# Patient Record
Sex: Female | Born: 1950 | Race: White | Hispanic: No
Health system: Midwestern US, Community
[De-identification: ages and names within clinical notes are randomized; demographics above are authoritative.]

## PROBLEM LIST (undated history)

## (undated) DIAGNOSIS — R7303 Prediabetes: Secondary | ICD-10-CM

## (undated) DIAGNOSIS — N189 Chronic kidney disease, unspecified: Secondary | ICD-10-CM

## (undated) DIAGNOSIS — G43909 Migraine, unspecified, not intractable, without status migrainosus: Secondary | ICD-10-CM

## (undated) DIAGNOSIS — F419 Anxiety disorder, unspecified: Secondary | ICD-10-CM

## (undated) DIAGNOSIS — F32A Depression, unspecified: Secondary | ICD-10-CM

## (undated) DIAGNOSIS — Z9289 Personal history of other medical treatment: Secondary | ICD-10-CM

## (undated) DIAGNOSIS — F329 Major depressive disorder, single episode, unspecified: Secondary | ICD-10-CM

## (undated) DIAGNOSIS — E039 Hypothyroidism, unspecified: Secondary | ICD-10-CM

## (undated) DIAGNOSIS — I4891 Unspecified atrial fibrillation: Secondary | ICD-10-CM

## (undated) DIAGNOSIS — F3181 Bipolar II disorder: Secondary | ICD-10-CM

## (undated) DIAGNOSIS — K219 Gastro-esophageal reflux disease without esophagitis: Secondary | ICD-10-CM

## (undated) DIAGNOSIS — Z9889 Other specified postprocedural states: Secondary | ICD-10-CM

## (undated) DIAGNOSIS — E785 Hyperlipidemia, unspecified: Secondary | ICD-10-CM

## (undated) DIAGNOSIS — M810 Age-related osteoporosis without current pathological fracture: Secondary | ICD-10-CM

## (undated) HISTORY — DX: Hyperlipidemia, unspecified: E78.5

## (undated) HISTORY — DX: Other specified postprocedural states: Z98.890

## (undated) HISTORY — DX: Unspecified atrial fibrillation: I48.91

## (undated) HISTORY — DX: Personal history of other medical treatment: Z92.89

## (undated) HISTORY — PX: KNEE SURGERY: SHX244

## (undated) HISTORY — PX: THUMB ARTHROSCOPY: SHX2509

## (undated) HISTORY — PX: CARDIOVERSION: SHX1299

## (undated) HISTORY — PX: LUNG REMOVAL, PARTIAL: SHX233

## (undated) HISTORY — DX: Hypothyroidism, unspecified: E03.9

## (undated) HISTORY — DX: Migraine, unspecified, not intractable, without status migrainosus: G43.909

## (undated) HISTORY — DX: Anxiety disorder, unspecified: F41.9

## (undated) HISTORY — DX: Depression, unspecified: F32.A

## (undated) HISTORY — DX: Major depressive disorder, single episode, unspecified: F32.9

## (undated) HISTORY — PX: THORACIC SPINE SURGERY: SHX802

## (undated) HISTORY — PX: ABLATION: SHX5711

---

## 2012-02-27 HISTORY — PX: BACK SURGERY: SHX140

## 2012-04-24 DIAGNOSIS — G43719 Chronic migraine without aura, intractable, without status migrainosus: Secondary | ICD-10-CM | POA: Insufficient documentation

## 2015-04-13 NOTE — Nursing Note (Signed)
Basic Admission Information - Text       Basic Admission Information Adult Entered On:  04/13/2015 8:02 EST    Performed On:  04/13/2015 8:01 EST by Nilsa Nutting A               Height/Weight   Weight Measured :   83.46 kg(Converted to: 184 lb 0 oz, 183.998 lb)    Height/Length Measured :   153.67 cm(Converted to: 5 ft 0 in, 5.04 ft, 60.50 in)    BSA Measured :   1.81 m2   Body Mass Index Measured :   35.34 kg/m2   Height/Length Estimated :   154.9 cm(Converted to: 5 ft 1 in, 5.08 ft, 60.98 in)    Body Mass Index Estimated :   34.78 kg/m2   Ideal Body Weight Calculated :   46.65 kg   Annely Sliva-PA,  Dashawn Bartnick A - 04/13/2015 8:01 EST

## 2015-04-18 NOTE — Discharge Summary (Signed)
 Inpatient Clinical Summary             [facility]  Post-Acute Care Transfer Instructions  PERSON INFORMATION   Name: Stephanie [name], Stephanie [name]   MRN: [id number]    FIN#: [id number]   PHYSICIANS  Admitting Physician: DELVIN LYNWOOD CROME  Attending Physician: DELVIN LYNWOOD CROME   PCP: GERARDA JONELLE CROCHET  Discharge Diagnosis: Carpal tunnel syndrome on right  Comment:       PATIENT EDUCATION INFORMATION  Instructions:             OUT PATIENT POST OP JI HAND/ARM (Custom)  Medication Leaflets:               Follow-up:                        With: Address: When:   PRICE-MD, JAMES L     [phone]    Comments:   Elevate hand, Keep clean and Dry  Return 04/28/15 at 1:00 PM in [facility] office with Ronette Ruddle PA.                         MEDICATION LIST  Medication Reconciliation at Discharge:         Medications that have not changed  Other Medications  bifidobacterium-lactobacillus (Probiotic Formula) 1 Tabs Oral (given by mouth) every day.  Last Dose:____________________  calcium carbonate (calcium (as carbonate) 600 mg oral tablet) 1 Tabs Oral (given by mouth) 2 times a day.  Last Dose:____________________  denosumab (Prolia 60 mg/mL subcutaneous solution) 1 Milliliter Subcutaneous (under the skin) every 6 months.  Last Dose:____________________  diazepam (Valium 5 mg oral tablet) 1 Tabs Oral (given by mouth) 2 times a day.  Last Dose:____________________  diphenhydrAMINE (Benadryl Dye Free Allergy 25 mg oral tablet) 2-4 TABS Oral (given by mouth) every 6 hours.  Last Dose:____________________  diphenoxylate-atropine (Lomotil 2.5 mg-0.025 mg oral tablet) 1 Tabs Oral (given by mouth) 4 times a day as needed for diarrhea.  Last Dose:____________________  eletriptan (Relpax 40 mg oral tablet) 1 Tabs Oral (given by mouth) 2 times a day as needed for migraine headache. may repeat dose once in 2 hours.  Last Dose:____________________  ergocalciferol (Vitamin D) 2,000 Units Oral (given by mouth) every day.  Last  Dose:____________________  esomeprazole (NexIUM 40 mg oral delayed release capsule) 1 Capsules Oral (given by mouth) every day.  Last Dose:____________________  estradiol (estradiol 1 mg oral tablet) 1 Tabs Oral (given by mouth) every day.  Last Dose:____________________  ferrous fumarate (Ferretts Iron 325 mg (106 mg elemental iron) oral tablet) 1 Tabs Oral (given by mouth) every day.  Last Dose:____________________  HYDROcodone-acetaminophen (Lortab 7.5/325) 1 Tabs Oral (given by mouth) every 6 hours as needed for mild pain (1-3) or temp > 100.5 F.  Last Dose:____________________  levothyroxine (levothyroxine 112 mcg (0.112 mg) oral capsule) 112 Microgram Oral (given by mouth) every day.  Last Dose:____________________  montelukast (Singulair 10 mg oral tablet) 1 Tabs Oral (given by mouth) once a day (in the evening).  Last Dose:____________________  morphine (morphine 15 mg/12 hr oral tablet, extended release) 1 Tabs Oral (given by mouth) every 12 hours as needed for moderate pain.  Last Dose:____________________  ondansetron (Zofran 4 mg oral tablet) 1 Tabs Oral (given by mouth) every 8 hours as needed for nausea/vomiting.  Last Dose:____________________  polyethylene glycol 3350 (MiraLax oral powder for reconstitution) T TSP Oral (given by mouth)  every day.  Last Dose:____________________  pravastatin (pravastatin 20 mg oral tablet) 1 Tabs Oral (given by mouth) every day.  Last Dose:____________________  pregabalin (Lyrica 150 mg oral capsule) 1 Capsules Oral (given by mouth) 3 times a day.  Last Dose:____________________  QUEtiapine (SEROquel 50 mg oral tablet)   Last Dose:____________________  temazepam (Restoril 30 mg oral capsule) 1 Capsules Oral (given by mouth) Once a Day (at bedtime) as needed.,  THIS MEDICATION IS ASSOCIATED WITH AN INCREASED RISK OF FALLS.  Last Dose:____________________  venlafaxine (Effexor XR 150 mg oral capsule, extended release) 1 Tabs Oral (given by mouth) every day.  Last  Dose:____________________         Patient's Final Home Medication List Upon Discharge:          bifidobacterium-lactobacillus (Probiotic Formula) 1 Tabs Oral (given by mouth) every day.  calcium carbonate (calcium (as carbonate) 600 mg oral tablet) 1 Tabs Oral (given by mouth) 2 times a day.  denosumab (Prolia 60 mg/mL subcutaneous solution) 1 Milliliter Subcutaneous (under the skin) every 6 months.  diazepam (Valium 5 mg oral tablet) 1 Tabs Oral (given by mouth) 2 times a day.  diphenhydrAMINE (Benadryl Dye Free Allergy 25 mg oral tablet) 2-4 TABS Oral (given by mouth) every 6 hours.  diphenoxylate-atropine (Lomotil 2.5 mg-0.025 mg oral tablet) 1 Tabs Oral (given by mouth) 4 times a day as needed for diarrhea.  eletriptan (Relpax 40 mg oral tablet) 1 Tabs Oral (given by mouth) 2 times a day as needed for migraine headache. may repeat dose once in 2 hours.  ergocalciferol (Vitamin D) 2,000 Units Oral (given by mouth) every day.  esomeprazole (NexIUM 40 mg oral delayed release capsule) 1 Capsules Oral (given by mouth) every day.  estradiol (estradiol 1 mg oral tablet) 1 Tabs Oral (given by mouth) every day.  ferrous fumarate (Ferretts Iron 325 mg (106 mg elemental iron) oral tablet) 1 Tabs Oral (given by mouth) every day.  HYDROcodone-acetaminophen (Lortab 7.5/325) 1 Tabs Oral (given by mouth) every 6 hours as needed for mild pain (1-3) or temp > 100.5 F.  levothyroxine (levothyroxine 112 mcg (0.112 mg) oral capsule) 112 Microgram Oral (given by mouth) every day.  montelukast (Singulair 10 mg oral tablet) 1 Tabs Oral (given by mouth) once a day (in the evening).  morphine (morphine 15 mg/12 hr oral tablet, extended release) 1 Tabs Oral (given by mouth) every 12 hours as needed for moderate pain.  ondansetron (Zofran 4 mg oral tablet) 1 Tabs Oral (given by mouth) every 8 hours as needed for nausea/vomiting.  polyethylene glycol 3350 (MiraLax oral powder for reconstitution) T TSP Oral (given by mouth) every  day.  pravastatin (pravastatin 20 mg oral tablet) 1 Tabs Oral (given by mouth) every day.  pregabalin (Lyrica 150 mg oral capsule) 1 Capsules Oral (given by mouth) 3 times a day.  QUEtiapine (SEROquel 50 mg oral tablet)   temazepam (Restoril 30 mg oral capsule) 1 Capsules Oral (given by mouth) Once a Day (at bedtime) as needed.,  THIS MEDICATION IS ASSOCIATED WITH AN INCREASED RISK OF FALLS.  venlafaxine (Effexor XR 150 mg oral capsule, extended release) 1 Tabs Oral (given by mouth) every day.         Comment:       ORDERS         Order Name Order Details   Discharge Patient 04/18/15 9:53:00 EST, Discharge Home/Self Care, When PACU discharge criteria met

## 2015-04-18 NOTE — Procedures (Signed)
IntraOp Record - [facility]             IntraOp Record - [facility] Summary                                                                   Primary Physician:        Reola Mosher    Case Number:              [id number]    Finalized Date/Time:      04/18/15 09:47:45    Pt. Name:                 Stephanie [name], Stephanie [name]    D.O.B./Sex:               21-Apr-1950    Female    Med Rec #:                [id number]    Physician:                Reola Mosher    Financial #:              [id number]    Pt. Type:                 S    Room/Bed:                 /    Admit/Disch:              04/18/15 07:10:00 -    Institution:       [facility] - Case Times                                                                                                         Entry 1                                                                                                          Patient      In Room Time             04/18/15 09:12:00               Out Room Time                   04/18/15 09:46:00    Anesthesia     Procedure  Start Time               04/18/15 09:28:00               Stop Time                       04/18/15 09:43:00    Last Modified By:         Delton See RN, PATRICIA A                              04/18/15 09:47:24      [facility] - Case Times Audit                                                                          04/18/15 09:47:24         Owner: [email or web address]                             Modifier: [email or web address]                                                      <+> 1         Out Room Time        <+> 1         Stop Time     04/18/15 09:30:03         Owner: [id number]                             Modifier: [email or web address]                                                      <+> 1         Start Time        JIOR - Safety Checklist - Sign In                                                                                         Entry 1  History/Physical on        Yes                             Procedure Consent               Yes    Chart                                                     on Chart     Site Marked (if           Yes    applicable)     Last Modified By:         Delton See, RN, PATRICIA A                              04/18/15 09:01:31      [facility] - Case Attendance                                                                                                    Entry 1                         Entry 2                         Entry 3                                          Case Attendee             Orrin Brigham        PRICE-MD,  Eulis Manly, RN, PATRICIA A    Role Performed            Anesthesiologist                Surgeon Primary                 Circulator    Time In                   04/18/15 09:12:00               04/18/15 09:12:00               04/18/15 09:12:00    Time Out                  04/18/15 09:46:00               04/18/15 09:46:00               04/18/15 09:46:00  Procedure                 Wrist Carpal Tunnel             Wrist Carpal Tunnel             Wrist Carpal Tunnel                              Release or                      Release or                      Release or                              Neurolysi(Left)                 Neurolysi(Left)                 Neurolysi(Left)    Last Modified By:         Delton See RN, PATRICIA Trilby Leaver, RN, PATRICIA Trilby Leaver, RN, PATRICIA A                              04/18/15 09:47:33               04/18/15 09:47:33               04/18/15 09:47:33                                Entry 4                                                                                                          Case Attendee             Jarvis Newcomer A    Role Performed            Surgical Scrub    Time In                   04/18/15 09:12:00    Time Out                  04/18/15 09:46:00    Procedure                 Wrist Carpal Tunnel                              Release or  Neurolysi(Left)    Last Modified By:         Delton See, RN, PATRICIA A                              04/18/15 09:47:33      [facility] - Case Attendance Audit                                                                     04/18/15 09:47:33         Owner: [id number]                             Modifier: [email or web address]                                                          1     <+> Time In            1     <+> Time Out            1     <*> Procedure                              Wrist Carpal Tunnel Release or Neurolysi(Left)            2     <+> Time In            2     <+> Time Out            2     <*> Procedure                              Wrist Carpal Tunnel Release or Neurolysi(Left)            3     <+> Time In            3     <+> Time Out            3     <*> Procedure                              Wrist Carpal Tunnel Release or Neurolysi(Left)            4     <+> Time In            4     <+> Time Out            4     <*> Procedure                              Wrist Carpal Tunnel Release or Neurolysi(Left)     04/18/15 08:56:48         Owner: [id number]  Modifier: [email or web address]                                                      <+> 1         Procedure        <+> 2         Procedure        <+> 3         Case Attendee        <+> 3         Role Performed        <+> 3         Procedure        <+> 4         Case Attendee        <+> 4         Role Performed        <+> 4         Procedure        [facility] - Skin Assessment                                                                                                    Entry 1                                                                                                          Skin Integrity            Intact    Last Modified By:         Delton See, RN, PATRICIA A                              04/18/15 09:01:37      Misha.Shall - Patient Positioning                                                                                                Entry 1  Procedure                 Wrist Carpal Tunnel             Body Position                   Supine                              Release or                              Neurolysi(Left)    Left Arm Position         Extended on Padded Arm          Right Arm Position              Extended on Hand Table                              Board w/Security Strap    Left Leg Position         Extended                        Right Leg Position              Extended    Feet Uncrossed            Yes                             Pressure Points                 Yes                                                              Checked     Positioning Device        C Cradle Foam, Arm              Positioned By                   PRICE-MD,  JAMES L                              Cradle Foam/Gel,                              Pillow, Safety Strap    Outcome Met (O.80)        Yes    Last Modified ByDelton See, RN, PATRICIA A                              04/18/15 09:01:27      [facility] - Skin Prep  Entry 1                                                                                                          Hair Removal     Skin Prep      Prep Agents (Im.270)     Chlorhexidine Gluconate         Prep Area (Im.270)              Arm Hand Right                              2% w/Alcohol     Prep Area Details        Right                           Prep By                         PRICE-MD,  Fayrene Fearing L    Outcome Met (O.100)       Yes    Last Modified By:         Delton See, RN, PATRICIA A                              04/18/15 09:02:06      JIOR - Counts Initial and Final                                                                                           Entry 1                                                                                                           Initial Counts      Initial Counts           NELSON, RN, PATRICIA A,         Items included in               Sponges, Sharps     Performed By             Jarvis Newcomer  A                  the Initial Count     Final Counts      Final Counts             NELSON, RN, PATRICIA A,         Final Count Status              Correct     Performed By             Jarvis Newcomer A     Items Included in        Sponges, Sharps     Final Count     Outcome Met (O.20)        Yes    Last Modified By:         Delton See, RN, PATRICIA A                              04/18/15 08:57:54      [facility] - General Case Data                                                                                                  Entry 1                                                                                                          Case Information      ASA Class                3                               Case Level                      Level 2     OR                       JI 01                           Specialty                       Orthopedic (SN)     Wound Class              1-Clean    Preop Diagnosis           G56.01    Last Modified By:  NELSON, RN, PATRICIA A                              04/18/15 09:18:04      JIOR - General Case Data Audit                                                                   04/18/15 09:18:04         Owner: [email or web address]                             Modifier: [email or web address]                                                      <+> 1         ASA Class        [facility] - Fire Risk Assessment                                                                                               Entry 1                                                                                                          Fire Risk                 Alcohol Based Prep              Fire Risk Score                 2    Assessment: If            Solution, Ignition    checked, checkmark        Source In Use    = 1 point     Last Modified By:          Delton See, RN, PATRICIA A                              04/18/15 08:58:53      [facility] - Safety Checklist - Time Out  Entry 1                                                                                                          Surgical/Procedure        Yes                             Time Out Complete               04/18/15 09:25:00    Team confirms     correct patient,     correct site and     correct procedure     Last Modified By:         Delton See, RN, PATRICIA A                              04/18/15 09:25:34      [facility] - Cautery                                                                                                            Entry 1                                                                                                          ESU Type                  GENERATOR                       Identification                  [id number]                              COVIDIEN/VALLEYLAB              Number     Bipolar Setting           15  Grounding Pad                   No    (watts)                                                   Needed?     Outcome Met (O.10)        Yes    Last Modified By:         Delton See, RN, PATRICIA A                              04/18/15 08:57:08      Misha.Shall - Patient Care Devices                                                                                               Entry 1                         Entry 2                                                                          Equipment Type            MACHINE SEQUENTIAL              BAIR HUGGER                              COMPRESSION    SCD Sleeve Site           Legs Bilateral    Equipment/Tag Number      [id number]                          [id number]    Initiated Pre             Yes    Induction     Last Modified By:         Delton See RN, PATRICIA Trilby Leaver, RN, PATRICIA A                              04/18/15 09:00:37                04/18/15 09:00:37      [facility] - Tourniquet  Entry 1                                                                                                          Tourniquet Type           TOURNIQUET CUFF 18IN W/         Serial Number                   [id number]                              PLC CONN STRL DISP    Setting                   250 mmHg                        Placement                       Arm Upper Right    Padding (Im.120)          Yes    Tourniquet Times      Inflated                 04/18/15 09:27:00               Deflated                        04/18/15 09:32:00    Applied By                PRICE-MD,  Fayrene Fearing L              Outcome Met (O.60)              Yes    Last Modified By:         Delton See, RN, PATRICIA A                              04/18/15 09:32:54      [facility] - Tourniquet Audit                                                                          04/18/15 09:32:54         Owner: [email or web address]                             Modifier: [email or web address]  1     <*> Tourniquet Type                        TOURNIQUET CUFF 18IN W/ PLC CONN STRL DISP            1     <+> Deflated     04/18/15 09:27:51         Owner: [email or web address]                             Modifier: [email or web address]                                                          1     <*> Tourniquet Type                        TOURNIQUET CUFF 18IN W/ PLC CONN STRL DISP            1     <+> Inflated        [facility] - Medications                                                                                                        Entry 1                         Entry 2                                                                          Time Administered         04/18/15 09:27:00               04/18/15 09:27:00    Medication                BUPIVACAINE HCL 0.5%            LIDOCAINE HCL 1%                               INJECTION 0.5%             INJECTION 1% 20 ML    Route of Admin            Local Injection                 Local Injection    Dose/Volume               5  ML                            5 ML    (include amount and     unit of measure)     Site                      Hand                            Hand    Site Detail               Right                           Right    Administered By           PRICE-MD,  Llana Aliment              PRICE-MD,  JAMES L    Outcome Met (O.130)       Yes                             Yes    Last Modified By:         Delton See RN, PATRICIA Trilby Leaver, RN, PATRICIA A                              04/18/15 09:28:41               04/18/15 09:28:41      J[facility] - Medications Audit                                                                         04/18/15 09:28:41         Owner: [email or web address]                             Modifier: [email or web address]                                                          1     <*> Medication                             BUPIVACAINE HCL 0.5% INJECTION 0.5%            1     <+> Time Administered            2     <*> Medication                             LIDOCAINE HCL 1% INJECTION 1% 20 ML  2     <+> Time Administered        JIOR - Dressing/Packing                                                                                                   Entry 1                                                                                                          Site                      Hand                            Site Details                    Right    Dressing Item     Details      Dressing Item            Medicated Gauze, 4x4's,     (Im.290)                 Kerlix/Kling Wrap,                              Elastic wrap    Last Modified By:         Delton See, RN, PATRICIA A                              04/18/15 08:58:17      [facility] - Procedures                                                                                                         Entry 1  Procedure     Description      Procedure                Wrist Carpal Tunnel             Modifiers                       Left                              Release or Neurolysis     Surgical Procedure       WRIST CARPAL TUNNEL     Text                     RELEASE LEFT    Primary Procedure         Yes                             Primary Surgeon                 Reola Mosher    Start                     04/18/15 09:28:00               Stop                            04/18/15 09:43:00    Anesthesia Type           Monitored Anesthesia            Surgical Service                Orthopedic (SN)                              Care    Wound Class               1-Clean    Last Modified By:         Delton See, RN, PATRICIA A                              04/18/15 09:47:28      JIOR - Procedures Audit                                                                          04/18/15 09:47:28         Owner: [email or web address]                             Modifier: [email or web address]  1     <*> Procedure                              Wrist Carpal Tunnel Release or Neurolysis            1     <+> Start            1     <+> Stop     04/18/15 09:09:02         Owner: [email or web address]                             Modifier: [email or web address]                                                          1     <*> Procedure                              Wrist Carpal Tunnel Release or Neurolysis            1     <*> Surgical Procedure Text                WRIST CARPAL TUNNEL RELEASE RIGHT        [facility] - Safety Checklist - Sign Out                                                                                        Entry 1                                                                                                          Patient Safety            Yes    Communication Guide     Used Throughout Case     Last Modified ByDelton See, RN,  PATRICIA A                              04/18/15 09:01:34      [facility] - Transfer  Entry 1                                                                                                          Transferred By            Blake Divine          Via                             Donata Clay, RN, PATRICIA                              A    Post-op Destination       PACU    Skin Assessment      Condition                Intact    Last Modified By:         Delton See RN, PATRICIA A                              04/18/15 09:02:55

## 2015-04-18 NOTE — Op Note (Signed)
Operative Report    Domenica Fail L. Samuella Cota, MD  Service Date: 04/18/2015    PREOPERATIVE DIAGNOSIS:  Carpal tunnel syndrome, left hand.    POSTOPERATIVE DIAGNOSIS:  Carpal tunnel syndrome, left hand.    PROCEDURE:  Median nerve neuroplasty of left wrist.    SURGEON:  Dr. Samuella Cota.    ANESTHESIA:  Local and sedation.  Sedation delivered by Dr. Gerhard Munch.    DESCRIPTION OF PROCEDURE:  The patient taken to operating room 1 at  [facility], supine positioning, chlorhexidine prep,  sterile disposable linen drape, hand elevated and tourniquet inflated  to 250 mmHg.  Local infiltration with Xylocaine/Marcaine 50/50 mix.   Curvilinear incision carried down through skin and subcutaneous tissue  after Xylocaine/Marcaine 50/50 mix over the palmar aspect of the hand.   Palmar fascia was divided sharply.  Transverse carpal ligament was  divided from distal to proximal with retrograde division of  antebrachial fascia carried out for a short distance.  Median nerve  inspected, found to be compressed in the midpoint.  Floor of the canal  was palpated.  There were no masses present.  Tourniquet was deflated.   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   Hemostasis obtained with bipolar electrocautery.  Wounds were  copiously irrigated and skin closed with 4-0 nylon.  Bulky soft  dressing was applied.  She went to recovery room in good condition.   There were no complications.  Blood loss was less than 5 mL.  There  was no pathology specimen.      Montey Hora, MD  TR: *n DD: 04/18/2015 10:11 TD: 04/18/2015 11:40 Job#: [id number]  \\[id number]\\DOC#: [id number]  \\[id number]\\  Signature Line    Electronically Signed on 04/20/2015 04:48 PM EST  ________________________________________________  PRICE-MD,  Llana Aliment

## 2015-04-18 NOTE — Discharge Summary (Signed)
 Inpatient Patient Summary               [facility] - [facility]  [address]  [address]  [place], [place] [address]  [phone]  Patient Discharge Instructions  Name: Stephanie [name], Stephanie [name]  Current Date: 04/18/2015 09:58:05  DOB: [birth year] MRN: [id number] FIN: NBR%[phone]  Patient Address: [address] [place] [address]  Patient Phone: [phone]  Primary Care Provider:  Name: Stephanie [name]  Phone: [phone]   Immunizations Provided:    Discharge Diagnosis: Carpal tunnel syndrome on right  Discharged To: ANTICIPATED%>  Home Treatments: ANTICIPATED%>  Devices/Equipment: REHAB%>  Post Hospital Services: HOSPITAL SERVICES%>  Professional Skilled Services: SKILLED SERVICES%>  Therapist, sports and Community Resources: SERV AND COMM RES, ANTICIPATED%>  Mode of Discharge Transportation: TRANSPORTATION%>  Discharge Orders         Discharge Patient 04/18/15 9:53:00 EST, Discharge Home/Self Care, When PACU discharge criteria met      Comment:   Medications   During the course of your visit, your medication list was updated with the most current information. The details of those changes are reflected below:         Medications that have not changed  Other Medications  bifidobacterium-lactobacillus (Probiotic Formula) 1 Tabs Oral (given by mouth) every day.  Last Dose:____________________  calcium carbonate (calcium (as carbonate) 600 mg oral tablet) 1 Tabs Oral (given by mouth) 2 times a day.  Last Dose:____________________  denosumab (Prolia 60 mg/mL subcutaneous solution) 1 Milliliter Subcutaneous (under the skin) every 6 months.  Last Dose:____________________  diazepam (Valium 5 mg oral tablet) 1 Tabs Oral (given by mouth) 2 times a day.  Last Dose:____________________  diphenhydrAMINE (Benadryl Dye Free Allergy 25 mg oral tablet) 2-4 TABS Oral (given by mouth) every 6 hours.  Last Dose:____________________  diphenoxylate-atropine  (Lomotil 2.5 mg-0.025 mg oral tablet) 1 Tabs Oral (given by mouth) 4 times a day as needed for diarrhea.  Last Dose:____________________  eletriptan (Relpax 40 mg oral tablet) 1 Tabs Oral (given by mouth) 2 times a day as needed for migraine headache. may repeat dose once in 2 hours.  Last Dose:____________________  ergocalciferol (Vitamin D) 2,000 Units Oral (given by mouth) every day.  Last Dose:____________________  esomeprazole (NexIUM 40 mg oral delayed release capsule) 1 Capsules Oral (given by mouth) every day.  Last Dose:____________________  estradiol (estradiol 1 mg oral tablet) 1 Tabs Oral (given by mouth) every day.  Last Dose:____________________  ferrous fumarate (Ferretts Iron 325 mg (106 mg elemental iron) oral tablet) 1 Tabs Oral (given by mouth) every day.  Last Dose:____________________  HYDROcodone-acetaminophen (Lortab 7.5/325) 1 Tabs Oral (given by mouth) every 6 hours as needed for mild pain (1-3) or temp > 100.5 F.  Last Dose:____________________  levothyroxine (levothyroxine 112 mcg (0.112 mg) oral capsule) 112 Microgram Oral (given by mouth) every day.  Last Dose:____________________  montelukast (Singulair 10 mg oral tablet) 1 Tabs Oral (given by mouth) once a day (in the evening).  Last Dose:____________________  morphine (morphine 15 mg/12 hr oral tablet, extended release) 1 Tabs Oral (given by mouth) every 12 hours as needed for moderate pain.  Last Dose:____________________  ondansetron (Zofran 4 mg oral tablet) 1 Tabs Oral (given by mouth) every 8 hours as needed for nausea/vomiting.  Last Dose:____________________  polyethylene glycol 3350 (MiraLax oral powder for reconstitution) T TSP Oral (given by mouth) every day.  Last Dose:____________________  pravastatin (pravastatin 20  mg oral tablet) 1 Tabs Oral (given by mouth) every day.  Last Dose:____________________  pregabalin (Lyrica 150 mg oral capsule) 1 Capsules Oral (given by mouth) 3 times a day.  Last  Dose:____________________  QUEtiapine (SEROquel 50 mg oral tablet)   Last Dose:____________________  temazepam (Restoril 30 mg oral capsule) 1 Capsules Oral (given by mouth) Once a Day (at bedtime) as needed.,  THIS MEDICATION IS ASSOCIATED WITH AN INCREASED RISK OF FALLS.  Last Dose:____________________  venlafaxine (Effexor XR 150 mg oral capsule, extended release) 1 Tabs Oral (given by mouth) every day.  Last Dose:____________________      [facility] would like to thank you for allowing us  to assist you with your healthcare needs. The following includes patient education materials and information regarding your injury/illness.  [name], [name] has been given the following list of follow-up instructions, prescriptions, and patient education materials:  Follow-up Instructions             With: Address: When:   Stephanie [name]     [phone]    Comments:   Elevate hand, Keep clean and Dry  Return 04/28/15 at 1:00 PM in [facility] office with Stephanie Ruddle PA.                It is important to always keep an active list of medications available so that you can share with other providers and manage your medications appropriately. As an additional courtesy, we are also providing you with your final active medications list that you can keep with you.          bifidobacterium-lactobacillus (Probiotic Formula) 1 Tabs Oral (given by mouth) every day.  calcium carbonate (calcium (as carbonate) 600 mg oral tablet) 1 Tabs Oral (given by mouth) 2 times a day.  denosumab (Prolia 60 mg/mL subcutaneous solution) 1 Milliliter Subcutaneous (under the skin) every 6 months.  diazepam (Valium 5 mg oral tablet) 1 Tabs Oral (given by mouth) 2 times a day.  diphenhydrAMINE (Benadryl Dye Free Allergy 25 mg oral tablet) 2-4 TABS Oral (given by mouth) every 6 hours.  diphenoxylate-atropine (Lomotil 2.5 mg-0.025 mg oral tablet) 1 Tabs Oral (given by mouth) 4 times a day as needed for diarrhea.  eletriptan (Relpax 40 mg oral  tablet) 1 Tabs Oral (given by mouth) 2 times a day as needed for migraine headache. may repeat dose once in 2 hours.  ergocalciferol (Vitamin D) 2,000 Units Oral (given by mouth) every day.  esomeprazole (NexIUM 40 mg oral delayed release capsule) 1 Capsules Oral (given by mouth) every day.  estradiol (estradiol 1 mg oral tablet) 1 Tabs Oral (given by mouth) every day.  ferrous fumarate (Ferretts Iron 325 mg (106 mg elemental iron) oral tablet) 1 Tabs Oral (given by mouth) every day.  HYDROcodone-acetaminophen (Lortab 7.5/325) 1 Tabs Oral (given by mouth) every 6 hours as needed for mild pain (1-3) or temp > 100.5 F.  levothyroxine (levothyroxine 112 mcg (0.112 mg) oral capsule) 112 Microgram Oral (given by mouth) every day.  montelukast (Singulair 10 mg oral tablet) 1 Tabs Oral (given by mouth) once a day (in the evening).  morphine (morphine 15 mg/12 hr oral tablet, extended release) 1 Tabs Oral (given by mouth) every 12 hours as needed for moderate pain.  ondansetron (Zofran 4 mg oral tablet) 1 Tabs Oral (given by mouth) every 8 hours as needed for nausea/vomiting.  polyethylene glycol 3350 (MiraLax oral powder for reconstitution) T TSP Oral (given by mouth) every day.  pravastatin (  pravastatin 20 mg oral tablet) 1 Tabs Oral (given by mouth) every day.  pregabalin (Lyrica 150 mg oral capsule) 1 Capsules Oral (given by mouth) 3 times a day.  QUEtiapine (SEROquel 50 mg oral tablet)   temazepam (Restoril 30 mg oral capsule) 1 Capsules Oral (given by mouth) Once a Day (at bedtime) as needed.,  THIS MEDICATION IS ASSOCIATED WITH AN INCREASED RISK OF FALLS.  venlafaxine (Effexor XR 150 mg oral capsule, extended release) 1 Tabs Oral (given by mouth) every day.      Take only the medications listed above. Contact your doctor prior to taking any medications not on this list.  Discharge instructions, if any, will display below  Instructions for Diet: INSTRUCTIONS FOR DIET%>   Instructions for Supplements: SUPPLEMENT  INSTRUCTIONS%>   Instructions for Activity: INSTRUCTIONS FOR ACTIVITY%>   Instructions for Wound Care: INSTRUCTIONS FOR WOUND CARE%>  Medication leaflets, if any, will display below     Patient education materials, if any, will display below           UPPER EXTREMITY (Hand/Arm) - Out Patient Post Operative Instructions  General Information:  - You may experience lightheadedness, forgetfulness, dizziness, sleepiness, headache, nausea, sore throat, or muscular pains following surgery  - For any emergencies call 911  -Your reflexes will be dimished after receiving anesthetic drugs         Do not operate a vechicle or heavy machinery for 24 hours         Do not drink any alcoholic beverages or smoke for 24 hours         Avoid making any important decisions for 24 hours         Do not stay alone for the next 24 hours  Diet/Fluids  -Begin with clear liquids, then progress to your regular diet if no nausea   -Greasy and spicy foods are not advised  Activity  -You are advised to go directly home from the hospital and restrict your activites for the rest of the day  Medications:  -Follow your Discharge Medication sheet, as instructed  -If ordered, begin any newly prescribed medications. Discontinue use if: nausea, vomiting, itching or rash develops and call your doctor   -If you develop a fever (over 101*), chills, active bleeding, excessive swelling, or nausea or vomiting past the 24hr period call your doctor.   Specific Instructions:  -Elevate extremity 48-72 hours  -Keep dressing clean, dry and intact until follow up.   -Check for circulation changes to operative area (pale/blue color)  -Open and close hand to exercise if instructed    Follow up Care:  Call the office if you don't already have an appt scheduled.             IS IT A STROKE? Act FAST and Check for these signs:   FACE Does the face look uneven?   ARM Does one arm drift down?   SPEECH Does their speech sound strange?   TIME Call 9-1-1 at any sign of  stroke  Heart Attack Signs  Chest discomfort: Most heart attacks involve discomfort in the center of the chest and lasts more than a few minutes, or goes away and comes back. It can feel like uncomfortable pressure, squeezing, fullness or pain.  Discomfort in upper body: Symptoms can include pain or discomfort in one or both arms, back, neck, jaw or stomach.  Shortness of breath: With or without discomfort.  Other signs: Breaking out in a cold sweat, nausea, or lightheaded.  Remember, MINUTES DO MATTER. If you experience any of these heart attack warning signs, call 9-1-1 to get immediate medical attention!       Yes - Patient/Family/Caregiver demonstrates understanding of instructions given  ______________________________ ___________ ___________________ ___________  Patient/Family/ Caregiver Signature Date/Time Provider Signature Date/Time

## 2015-06-08 DIAGNOSIS — Z79899 Other long term (current) drug therapy: Secondary | ICD-10-CM | POA: Diagnosis not present

## 2015-06-08 DIAGNOSIS — Z5181 Encounter for therapeutic drug level monitoring: Secondary | ICD-10-CM | POA: Diagnosis not present

## 2015-07-07 DIAGNOSIS — M47894 Other spondylosis, thoracic region: Secondary | ICD-10-CM | POA: Diagnosis not present

## 2015-07-07 DIAGNOSIS — Z5181 Encounter for therapeutic drug level monitoring: Secondary | ICD-10-CM | POA: Diagnosis not present

## 2015-07-07 DIAGNOSIS — Z79891 Long term (current) use of opiate analgesic: Secondary | ICD-10-CM | POA: Diagnosis not present

## 2015-07-07 DIAGNOSIS — M961 Postlaminectomy syndrome, not elsewhere classified: Secondary | ICD-10-CM | POA: Diagnosis not present

## 2015-07-13 DIAGNOSIS — Z23 Encounter for immunization: Secondary | ICD-10-CM | POA: Diagnosis not present

## 2015-07-15 DIAGNOSIS — M85852 Other specified disorders of bone density and structure, left thigh: Secondary | ICD-10-CM | POA: Diagnosis not present

## 2015-07-15 DIAGNOSIS — Z780 Asymptomatic menopausal state: Secondary | ICD-10-CM | POA: Diagnosis not present

## 2015-07-15 DIAGNOSIS — N959 Unspecified menopausal and perimenopausal disorder: Secondary | ICD-10-CM | POA: Diagnosis not present

## 2015-07-15 DIAGNOSIS — M810 Age-related osteoporosis without current pathological fracture: Secondary | ICD-10-CM | POA: Diagnosis not present

## 2015-07-15 DIAGNOSIS — Z13820 Encounter for screening for osteoporosis: Secondary | ICD-10-CM | POA: Diagnosis not present

## 2015-07-15 DIAGNOSIS — Z1231 Encounter for screening mammogram for malignant neoplasm of breast: Secondary | ICD-10-CM | POA: Diagnosis not present

## 2015-09-19 ENCOUNTER — Telehealth: Payer: Self-pay | Admitting: *Deleted

## 2015-09-19 NOTE — Telephone Encounter (Signed)
NEw Message  Pt wanted records faxed to our office in order to sched NEWPT appt- stated that her former cardiologist- Dr Luana Shu in [facility], requires an authorization form from our office in order to fax records. Please call back and discuss.

## 2015-09-19 NOTE — Telephone Encounter (Signed)
Called and spoke with patient she is aware I can either Email,mail, or she may come by and sign a release of information for [place] to obtain records from Dr.Baker her old Cardiologist so she may make a New Patient appointment.

## 2015-09-21 ENCOUNTER — Telehealth: Payer: Self-pay

## 2015-09-21 NOTE — Telephone Encounter (Signed)
ROI faxed to [place] Baker-records received back placed in Operator box in chart prep room so appointment could be made for patient.

## 2015-09-27 DIAGNOSIS — G43009 Migraine without aura, not intractable, without status migrainosus: Secondary | ICD-10-CM | POA: Diagnosis not present

## 2015-09-27 DIAGNOSIS — M797 Fibromyalgia: Secondary | ICD-10-CM | POA: Diagnosis not present

## 2015-09-27 DIAGNOSIS — E7800 Pure hypercholesterolemia, unspecified: Secondary | ICD-10-CM | POA: Diagnosis not present

## 2015-09-27 DIAGNOSIS — K589 Irritable bowel syndrome without diarrhea: Secondary | ICD-10-CM | POA: Diagnosis not present

## 2015-09-27 DIAGNOSIS — E6609 Other obesity due to excess calories: Secondary | ICD-10-CM | POA: Diagnosis not present

## 2015-09-27 DIAGNOSIS — K5901 Slow transit constipation: Secondary | ICD-10-CM | POA: Diagnosis not present

## 2015-09-27 DIAGNOSIS — Z9889 Other specified postprocedural states: Secondary | ICD-10-CM | POA: Diagnosis not present

## 2015-09-27 DIAGNOSIS — F323 Major depressive disorder, single episode, severe with psychotic features: Secondary | ICD-10-CM | POA: Diagnosis not present

## 2015-09-27 DIAGNOSIS — G894 Chronic pain syndrome: Secondary | ICD-10-CM | POA: Diagnosis not present

## 2015-09-27 DIAGNOSIS — J301 Allergic rhinitis due to pollen: Secondary | ICD-10-CM | POA: Diagnosis not present

## 2015-09-27 DIAGNOSIS — F5101 Primary insomnia: Secondary | ICD-10-CM | POA: Diagnosis not present

## 2015-09-27 DIAGNOSIS — E039 Hypothyroidism, unspecified: Secondary | ICD-10-CM | POA: Diagnosis not present

## 2015-10-06 DIAGNOSIS — G894 Chronic pain syndrome: Secondary | ICD-10-CM | POA: Diagnosis not present

## 2015-10-06 DIAGNOSIS — G43009 Migraine without aura, not intractable, without status migrainosus: Secondary | ICD-10-CM | POA: Diagnosis not present

## 2015-10-06 DIAGNOSIS — F323 Major depressive disorder, single episode, severe with psychotic features: Secondary | ICD-10-CM | POA: Diagnosis not present

## 2015-10-06 DIAGNOSIS — Z23 Encounter for immunization: Secondary | ICD-10-CM | POA: Diagnosis not present

## 2015-10-06 DIAGNOSIS — F5101 Primary insomnia: Secondary | ICD-10-CM | POA: Diagnosis not present

## 2015-10-11 ENCOUNTER — Ambulatory Visit (INDEPENDENT_AMBULATORY_CARE_PROVIDER_SITE_OTHER): Payer: Medicare Other | Admitting: Psychology

## 2015-10-11 DIAGNOSIS — F411 Generalized anxiety disorder: Secondary | ICD-10-CM

## 2015-10-11 DIAGNOSIS — F331 Major depressive disorder, recurrent, moderate: Secondary | ICD-10-CM

## 2015-10-13 DIAGNOSIS — G43009 Migraine without aura, not intractable, without status migrainosus: Secondary | ICD-10-CM | POA: Diagnosis not present

## 2015-10-20 DIAGNOSIS — Z049 Encounter for examination and observation for unspecified reason: Secondary | ICD-10-CM | POA: Diagnosis not present

## 2015-10-20 DIAGNOSIS — G43719 Chronic migraine without aura, intractable, without status migrainosus: Secondary | ICD-10-CM | POA: Diagnosis not present

## 2015-10-24 ENCOUNTER — Ambulatory Visit (INDEPENDENT_AMBULATORY_CARE_PROVIDER_SITE_OTHER): Payer: Medicare Other | Admitting: Psychology

## 2015-10-24 DIAGNOSIS — F331 Major depressive disorder, recurrent, moderate: Secondary | ICD-10-CM | POA: Diagnosis not present

## 2015-10-24 DIAGNOSIS — F411 Generalized anxiety disorder: Secondary | ICD-10-CM | POA: Diagnosis not present

## 2015-10-28 ENCOUNTER — Encounter: Payer: Self-pay | Admitting: Cardiovascular Disease

## 2015-11-02 DIAGNOSIS — G4700 Insomnia, unspecified: Secondary | ICD-10-CM | POA: Diagnosis not present

## 2015-11-02 DIAGNOSIS — F411 Generalized anxiety disorder: Secondary | ICD-10-CM | POA: Diagnosis not present

## 2015-11-02 DIAGNOSIS — F3181 Bipolar II disorder: Secondary | ICD-10-CM | POA: Diagnosis not present

## 2015-11-07 DIAGNOSIS — M791 Myalgia: Secondary | ICD-10-CM | POA: Diagnosis not present

## 2015-11-07 DIAGNOSIS — R51 Headache: Secondary | ICD-10-CM | POA: Diagnosis not present

## 2015-11-07 DIAGNOSIS — M542 Cervicalgia: Secondary | ICD-10-CM | POA: Diagnosis not present

## 2015-11-07 DIAGNOSIS — G518 Other disorders of facial nerve: Secondary | ICD-10-CM | POA: Diagnosis not present

## 2015-11-07 DIAGNOSIS — G43719 Chronic migraine without aura, intractable, without status migrainosus: Secondary | ICD-10-CM | POA: Diagnosis not present

## 2015-11-11 ENCOUNTER — Ambulatory Visit (INDEPENDENT_AMBULATORY_CARE_PROVIDER_SITE_OTHER): Payer: Medicare Other | Admitting: Psychology

## 2015-11-11 DIAGNOSIS — F319 Bipolar disorder, unspecified: Secondary | ICD-10-CM | POA: Diagnosis not present

## 2015-11-14 ENCOUNTER — Ambulatory Visit (INDEPENDENT_AMBULATORY_CARE_PROVIDER_SITE_OTHER): Payer: Medicare Other | Admitting: Cardiovascular Disease

## 2015-11-14 ENCOUNTER — Encounter: Payer: Self-pay | Admitting: Cardiovascular Disease

## 2015-11-14 ENCOUNTER — Encounter (INDEPENDENT_AMBULATORY_CARE_PROVIDER_SITE_OTHER): Payer: Self-pay

## 2015-11-14 DIAGNOSIS — I480 Paroxysmal atrial fibrillation: Secondary | ICD-10-CM

## 2015-11-14 MED ORDER — METOPROLOL SUCCINATE ER 50 MG PO TB24: 50.0000 mg | ORAL_TABLET | Freq: Every day | ORAL | 3 refills | Status: DC

## 2015-11-14 NOTE — Patient Instructions (Addendum)

## 2015-11-14 NOTE — Progress Notes (Signed)
Cardiology Office Note   Date:  11/14/2015   ID:  Jerre, Canal 1950/08/10, MRN [id number]  PCP:  No primary care provider on file.  Cardiologist:   Mertie Moores, MD   Chief Complaint  Patient presents with  . Atrial Fibrillation   Problem list 1. Paroxysmal atrial fibrillation 2. Hyperlipidemia 3.  Hypothyroidism 4.  Migraine headaches    History of Present Illness: Meredith [name] is a 65 y.o. female who presents for follow up of her paroxysmal atrial fib.  Previously from  [place], [place]  Has had 2 A. fib ablations and has had a more recent cryoablation. She was on [place] until a year ago .  Has not had any recurrent atrial fibrillation since that time.  She is on verapamil for history of migraine headaches.  She cannot tell when she is in atrial fib.   Usually has a very fast HR   Gets some exercise on rare occasion.     Past Medical History:  Diagnosis Date  . Anxiety   . Atrial fibrillation ([place])   . Depression   . History of cardioversion    x2  . Hyperlipidemia   . Hypothyroidism   . Migraine headache     Past Surgical History:  Procedure Laterality Date  . BACK SURGERY  2014  . CARDIOVERSION  12/2013, 01/2014   x2  . KNEE SURGERY     ORTHOSCOPIC  . LUNG REMOVAL, PARTIAL Right    BENIGN LUNG CYST  . THORACIC SPINE SURGERY Right      Current Outpatient Prescriptions  Medication Sig Dispense Refill  . eletriptan (RELPAX) 40 MG tablet Take 40 mg by mouth as needed for migraine or headache. May repeat in 2 hours if headache persists or recurs.    [name] esomeprazole (NEXIUM) 40 MG capsule Take 40 mg by mouth daily at 12 noon.    [name] estradiol (ESTRACE) 1 MG tablet Take 1 mg by mouth daily.    . ferrous fumarate (FERRETTS) 325 (106 Fe) MG TABS tablet Take 1 tablet by mouth daily.    [name] HYDROcodone-acetaminophen (NORCO) 7.5-325 MG tablet Take 1 tablet by mouth every 6 (six) hours as needed for moderate pain.    [name] levothyroxine  (SYNTHROID, LEVOTHROID) 112 MCG tablet Take 112 mcg by mouth daily before breakfast.    . metoprolol succinate (TOPROL-XL) 100 MG 24 hr tablet Take 50 mg by mouth daily. Take with or immediately following a meal.     . montelukast (SINGULAIR) 10 MG tablet Take 10 mg by mouth at bedtime.    . ondansetron (ZOFRAN) 4 MG tablet Take 4 mg by mouth 3 (three) times daily as needed. As needed for nausea    . pravastatin (PRAVACHOL) 40 MG tablet Take 40 mg by mouth daily.    . pregabalin (LYRICA) 300 MG capsule Take 150 mg by mouth daily.     . QUEtiapine (SEROQUEL) 200 MG tablet Take 50 mg by mouth at bedtime.     [name] venlafaxine XR (EFFEXOR-XR) 150 MG 24 hr capsule Take 150 mg by mouth daily with breakfast.    . verapamil (VERELAN PM) 120 MG 24 hr capsule Take 120 mg by mouth daily.     No current facility-administered medications for this visit.     Allergies:   Amoxicillin; Ampicillin; Celebrex [celecoxib]; Codeine; Penicillins; Sulfa antibiotics; and Tricyclic antidepressants    Social History:  The patient  reports that she has never smoked. She has never  used smokeless tobacco. She reports that she does not drink alcohol or use drugs.   Family History:  The patient's family history includes Atrial fibrillation in her brother.    ROS:  Please see the history of present illness.    Review of Systems: Constitutional:  denies fever, chills, diaphoresis, appetite change and fatigue.  HEENT: denies photophobia, eye pain, redness, hearing loss, ear pain, congestion, sore throat, rhinorrhea, sneezing, neck pain, neck stiffness and tinnitus.  Respiratory: denies SOB, DOE, cough, chest tightness, and wheezing.  Cardiovascular: denies chest pain, palpitations and leg swelling.  Gastrointestinal: denies nausea, vomiting, abdominal pain, diarrhea, constipation, blood in stool.  Genitourinary: denies dysuria, urgency, frequency, hematuria, flank pain and difficulty urinating.  Musculoskeletal: denies   myalgias, back pain, joint swelling, arthralgias and gait problem.   Skin: denies pallor, rash and wound.  Neurological: denies dizziness, seizures, syncope, weakness, light-headedness, numbness and headaches.   Hematological: denies adenopathy, easy bruising, personal or family bleeding history.  Psychiatric/ Behavioral: denies suicidal ideation, mood changes, confusion, nervousness, sleep disturbance and agitation.       All other systems are reviewed and negative.    PHYSICAL EXAM: VS:  BP 90/60 (BP Location: Left Arm, Patient Position: Sitting, Cuff Size: Normal)   Pulse 67   Ht 5' (1.524 m)   Wt 171 lb 1.9 oz (77.6 kg)   LMP  (LMP Unknown)   BMI 33.42 kg/m  , BMI Body mass index is 33.42 kg/m. GEN: Well nourished, well developed, in no acute distress  HEENT: normal  Neck: no JVD, carotid bruits, or masses Cardiac: RRR; no murmurs, rubs, or gallops,no edema  Respiratory:  clear to auscultation bilaterally, normal work of breathing GI: soft, nontender, nondistended, + BS MS: no deformity or atrophy  Skin: warm and dry, no rash Neuro:  Strength and sensation are intact Psych: normal   EKG:  EKG is ordered today. The ekg ordered today demonstrates  NSR at 67.   No ST or T wave   Recent Labs: No results found for requested labs within last 8760 hours.    Lipid Panel No results found for: CHOL, TRIG, HDL, CHOLHDL, VLDL, LDLCALC, LDLDIRECT    Wt Readings from Last 3 Encounters:  11/14/15 171 lb 1.9 oz (77.6 kg)      Other studies Reviewed: Additional studies/ records that were reviewed today include: . Review of the above records demonstrates:    ASSESSMENT AND PLAN:  1.  Paroxysmal atrial fib:   Has had 3 a-fib ablations  ( 2 regular ablations and 1 cryo- ablation)   She's currently not on any anticoagulation. Continue with current aspirin. I discussed starting long-term anticoagulation with her next am. Her HCADS2VASC score is 2 ( female, age 31)  I think  that she should take NOAC or coumadin . Continue to take ASA for now .    Current medicines are reviewed at length with the patient today.  The patient does not have concerns regarding medicines.  Labs/ tests ordered today include:  No orders of the defined types were placed in this encounter.    Disposition:   FU with  Me in 1 year.     Mertie Moores, MD  11/14/2015 4:24 PM    [facility] [place], [place], [place]  [address] Phone: [phone]; Fax: [phone]   [facility]  [address] [place],   [address] [phone]   Fax [phone]

## 2015-11-21 DIAGNOSIS — R51 Headache: Secondary | ICD-10-CM | POA: Diagnosis not present

## 2015-11-21 DIAGNOSIS — M791 Myalgia: Secondary | ICD-10-CM | POA: Diagnosis not present

## 2015-11-21 DIAGNOSIS — M542 Cervicalgia: Secondary | ICD-10-CM | POA: Diagnosis not present

## 2015-11-21 DIAGNOSIS — G518 Other disorders of facial nerve: Secondary | ICD-10-CM | POA: Diagnosis not present

## 2015-11-21 DIAGNOSIS — G43719 Chronic migraine without aura, intractable, without status migrainosus: Secondary | ICD-10-CM | POA: Diagnosis not present

## 2015-11-22 DIAGNOSIS — F3181 Bipolar II disorder: Secondary | ICD-10-CM | POA: Diagnosis not present

## 2015-11-22 DIAGNOSIS — F411 Generalized anxiety disorder: Secondary | ICD-10-CM | POA: Diagnosis not present

## 2015-11-22 DIAGNOSIS — G4700 Insomnia, unspecified: Secondary | ICD-10-CM | POA: Diagnosis not present

## 2015-11-24 ENCOUNTER — Encounter: Payer: Self-pay | Admitting: Cardiovascular Disease

## 2015-11-25 ENCOUNTER — Ambulatory Visit (INDEPENDENT_AMBULATORY_CARE_PROVIDER_SITE_OTHER): Payer: Medicare Other | Admitting: Psychology

## 2015-11-25 DIAGNOSIS — F3181 Bipolar II disorder: Secondary | ICD-10-CM | POA: Diagnosis not present

## 2015-11-28 DIAGNOSIS — G43009 Migraine without aura, not intractable, without status migrainosus: Secondary | ICD-10-CM | POA: Diagnosis not present

## 2015-11-28 DIAGNOSIS — G894 Chronic pain syndrome: Secondary | ICD-10-CM | POA: Diagnosis not present

## 2015-11-28 DIAGNOSIS — F5101 Primary insomnia: Secondary | ICD-10-CM | POA: Diagnosis not present

## 2015-11-28 DIAGNOSIS — E039 Hypothyroidism, unspecified: Secondary | ICD-10-CM | POA: Diagnosis not present

## 2015-11-28 DIAGNOSIS — E7800 Pure hypercholesterolemia, unspecified: Secondary | ICD-10-CM | POA: Diagnosis not present

## 2015-11-28 DIAGNOSIS — G5601 Carpal tunnel syndrome, right upper limb: Secondary | ICD-10-CM | POA: Diagnosis not present

## 2015-11-28 DIAGNOSIS — F323 Major depressive disorder, single episode, severe with psychotic features: Secondary | ICD-10-CM | POA: Diagnosis not present

## 2015-12-05 DIAGNOSIS — G518 Other disorders of facial nerve: Secondary | ICD-10-CM | POA: Diagnosis not present

## 2015-12-05 DIAGNOSIS — M542 Cervicalgia: Secondary | ICD-10-CM | POA: Diagnosis not present

## 2015-12-05 DIAGNOSIS — M791 Myalgia: Secondary | ICD-10-CM | POA: Diagnosis not present

## 2015-12-05 DIAGNOSIS — R51 Headache: Secondary | ICD-10-CM | POA: Diagnosis not present

## 2015-12-05 DIAGNOSIS — G43719 Chronic migraine without aura, intractable, without status migrainosus: Secondary | ICD-10-CM | POA: Diagnosis not present

## 2015-12-09 ENCOUNTER — Ambulatory Visit (INDEPENDENT_AMBULATORY_CARE_PROVIDER_SITE_OTHER): Payer: Medicare Other | Admitting: Psychology

## 2015-12-09 DIAGNOSIS — F3189 Other bipolar disorder: Secondary | ICD-10-CM

## 2015-12-15 DIAGNOSIS — F411 Generalized anxiety disorder: Secondary | ICD-10-CM | POA: Diagnosis not present

## 2015-12-15 DIAGNOSIS — F3181 Bipolar II disorder: Secondary | ICD-10-CM | POA: Diagnosis not present

## 2015-12-15 DIAGNOSIS — G4700 Insomnia, unspecified: Secondary | ICD-10-CM | POA: Diagnosis not present

## 2015-12-19 DIAGNOSIS — M791 Myalgia: Secondary | ICD-10-CM | POA: Diagnosis not present

## 2015-12-19 DIAGNOSIS — G43719 Chronic migraine without aura, intractable, without status migrainosus: Secondary | ICD-10-CM | POA: Diagnosis not present

## 2015-12-19 DIAGNOSIS — M542 Cervicalgia: Secondary | ICD-10-CM | POA: Diagnosis not present

## 2015-12-19 DIAGNOSIS — G518 Other disorders of facial nerve: Secondary | ICD-10-CM | POA: Diagnosis not present

## 2015-12-19 DIAGNOSIS — R51 Headache: Secondary | ICD-10-CM | POA: Diagnosis not present

## 2015-12-22 ENCOUNTER — Ambulatory Visit (INDEPENDENT_AMBULATORY_CARE_PROVIDER_SITE_OTHER): Payer: Medicare Other | Admitting: Psychology

## 2015-12-22 DIAGNOSIS — F3181 Bipolar II disorder: Secondary | ICD-10-CM

## 2015-12-26 DIAGNOSIS — G5601 Carpal tunnel syndrome, right upper limb: Secondary | ICD-10-CM | POA: Diagnosis not present

## 2016-01-05 ENCOUNTER — Ambulatory Visit (INDEPENDENT_AMBULATORY_CARE_PROVIDER_SITE_OTHER): Payer: Medicare Other | Admitting: Psychology

## 2016-01-05 DIAGNOSIS — Z79899 Other long term (current) drug therapy: Secondary | ICD-10-CM | POA: Diagnosis not present

## 2016-01-05 DIAGNOSIS — F438 Other reactions to severe stress: Secondary | ICD-10-CM

## 2016-01-05 DIAGNOSIS — Z79891 Long term (current) use of opiate analgesic: Secondary | ICD-10-CM | POA: Diagnosis not present

## 2016-01-05 DIAGNOSIS — M549 Dorsalgia, unspecified: Secondary | ICD-10-CM | POA: Diagnosis not present

## 2016-01-05 DIAGNOSIS — M961 Postlaminectomy syndrome, not elsewhere classified: Secondary | ICD-10-CM | POA: Diagnosis not present

## 2016-01-05 DIAGNOSIS — G894 Chronic pain syndrome: Secondary | ICD-10-CM | POA: Diagnosis not present

## 2016-01-05 DIAGNOSIS — M25552 Pain in left hip: Secondary | ICD-10-CM | POA: Diagnosis not present

## 2016-01-06 DIAGNOSIS — M1612 Unilateral primary osteoarthritis, left hip: Secondary | ICD-10-CM | POA: Diagnosis not present

## 2016-01-06 DIAGNOSIS — M47819 Spondylosis without myelopathy or radiculopathy, site unspecified: Secondary | ICD-10-CM | POA: Diagnosis not present

## 2016-01-06 DIAGNOSIS — Z981 Arthrodesis status: Secondary | ICD-10-CM | POA: Diagnosis not present

## 2016-01-06 DIAGNOSIS — M47814 Spondylosis without myelopathy or radiculopathy, thoracic region: Secondary | ICD-10-CM | POA: Diagnosis not present

## 2016-01-09 DIAGNOSIS — R51 Headache: Secondary | ICD-10-CM | POA: Diagnosis not present

## 2016-01-09 DIAGNOSIS — M791 Myalgia: Secondary | ICD-10-CM | POA: Diagnosis not present

## 2016-01-09 DIAGNOSIS — G43719 Chronic migraine without aura, intractable, without status migrainosus: Secondary | ICD-10-CM | POA: Diagnosis not present

## 2016-01-09 DIAGNOSIS — M542 Cervicalgia: Secondary | ICD-10-CM | POA: Diagnosis not present

## 2016-01-09 DIAGNOSIS — G518 Other disorders of facial nerve: Secondary | ICD-10-CM | POA: Diagnosis not present

## 2016-01-11 ENCOUNTER — Telehealth: Payer: Self-pay | Admitting: Cardiovascular Disease

## 2016-01-11 NOTE — Telephone Encounter (Signed)
Did not need this encounter °

## 2016-01-12 DIAGNOSIS — F4542 Pain disorder with related psychological factors: Secondary | ICD-10-CM | POA: Diagnosis not present

## 2016-01-12 DIAGNOSIS — G894 Chronic pain syndrome: Secondary | ICD-10-CM | POA: Diagnosis not present

## 2016-01-12 DIAGNOSIS — G609 Hereditary and idiopathic neuropathy, unspecified: Secondary | ICD-10-CM | POA: Diagnosis not present

## 2016-01-12 DIAGNOSIS — M792 Neuralgia and neuritis, unspecified: Secondary | ICD-10-CM | POA: Diagnosis not present

## 2016-01-18 ENCOUNTER — Ambulatory Visit: Payer: Medicare Other | Admitting: Psychology

## 2016-01-18 DIAGNOSIS — M545 Low back pain: Secondary | ICD-10-CM | POA: Diagnosis not present

## 2016-01-18 DIAGNOSIS — Z79899 Other long term (current) drug therapy: Secondary | ICD-10-CM | POA: Diagnosis not present

## 2016-01-18 DIAGNOSIS — G5600 Carpal tunnel syndrome, unspecified upper limb: Secondary | ICD-10-CM | POA: Diagnosis not present

## 2016-01-18 DIAGNOSIS — M549 Dorsalgia, unspecified: Secondary | ICD-10-CM | POA: Diagnosis not present

## 2016-01-18 DIAGNOSIS — Z79891 Long term (current) use of opiate analgesic: Secondary | ICD-10-CM | POA: Diagnosis not present

## 2016-01-18 DIAGNOSIS — G894 Chronic pain syndrome: Secondary | ICD-10-CM | POA: Diagnosis not present

## 2016-01-23 DIAGNOSIS — G43719 Chronic migraine without aura, intractable, without status migrainosus: Secondary | ICD-10-CM | POA: Diagnosis not present

## 2016-01-23 DIAGNOSIS — G518 Other disorders of facial nerve: Secondary | ICD-10-CM | POA: Diagnosis not present

## 2016-01-23 DIAGNOSIS — M542 Cervicalgia: Secondary | ICD-10-CM | POA: Diagnosis not present

## 2016-01-23 DIAGNOSIS — R51 Headache: Secondary | ICD-10-CM | POA: Diagnosis not present

## 2016-01-23 DIAGNOSIS — M791 Myalgia: Secondary | ICD-10-CM | POA: Diagnosis not present

## 2016-01-25 DIAGNOSIS — G5601 Carpal tunnel syndrome, right upper limb: Secondary | ICD-10-CM | POA: Diagnosis not present

## 2016-02-02 ENCOUNTER — Ambulatory Visit (INDEPENDENT_AMBULATORY_CARE_PROVIDER_SITE_OTHER): Payer: Medicare Other | Admitting: Psychology

## 2016-02-02 DIAGNOSIS — F3181 Bipolar II disorder: Secondary | ICD-10-CM

## 2016-02-06 DIAGNOSIS — F3181 Bipolar II disorder: Secondary | ICD-10-CM | POA: Diagnosis not present

## 2016-02-06 DIAGNOSIS — Z79899 Other long term (current) drug therapy: Secondary | ICD-10-CM | POA: Diagnosis not present

## 2016-02-09 DIAGNOSIS — G5601 Carpal tunnel syndrome, right upper limb: Secondary | ICD-10-CM | POA: Diagnosis not present

## 2016-02-14 DIAGNOSIS — F3181 Bipolar II disorder: Secondary | ICD-10-CM | POA: Diagnosis not present

## 2016-02-14 DIAGNOSIS — G4700 Insomnia, unspecified: Secondary | ICD-10-CM | POA: Diagnosis not present

## 2016-02-14 DIAGNOSIS — F411 Generalized anxiety disorder: Secondary | ICD-10-CM | POA: Diagnosis not present

## 2016-02-16 ENCOUNTER — Ambulatory Visit (INDEPENDENT_AMBULATORY_CARE_PROVIDER_SITE_OTHER): Payer: Medicare Other | Admitting: Psychology

## 2016-02-16 DIAGNOSIS — M546 Pain in thoracic spine: Secondary | ICD-10-CM | POA: Diagnosis not present

## 2016-02-16 DIAGNOSIS — Z79899 Other long term (current) drug therapy: Secondary | ICD-10-CM | POA: Diagnosis not present

## 2016-02-16 DIAGNOSIS — F3181 Bipolar II disorder: Secondary | ICD-10-CM

## 2016-02-16 DIAGNOSIS — M533 Sacrococcygeal disorders, not elsewhere classified: Secondary | ICD-10-CM | POA: Diagnosis not present

## 2016-02-16 DIAGNOSIS — M79643 Pain in unspecified hand: Secondary | ICD-10-CM | POA: Diagnosis not present

## 2016-02-16 DIAGNOSIS — Z79891 Long term (current) use of opiate analgesic: Secondary | ICD-10-CM | POA: Diagnosis not present

## 2016-02-16 DIAGNOSIS — G894 Chronic pain syndrome: Secondary | ICD-10-CM | POA: Diagnosis not present

## 2016-02-28 DIAGNOSIS — E7800 Pure hypercholesterolemia, unspecified: Secondary | ICD-10-CM | POA: Diagnosis not present

## 2016-02-28 DIAGNOSIS — Z6834 Body mass index (BMI) 34.0-34.9, adult: Secondary | ICD-10-CM | POA: Diagnosis not present

## 2016-02-28 DIAGNOSIS — E039 Hypothyroidism, unspecified: Secondary | ICD-10-CM | POA: Diagnosis not present

## 2016-02-28 DIAGNOSIS — E6609 Other obesity due to excess calories: Secondary | ICD-10-CM | POA: Diagnosis not present

## 2016-02-28 DIAGNOSIS — M797 Fibromyalgia: Secondary | ICD-10-CM | POA: Diagnosis not present

## 2016-02-28 DIAGNOSIS — J301 Allergic rhinitis due to pollen: Secondary | ICD-10-CM | POA: Diagnosis not present

## 2016-03-01 ENCOUNTER — Ambulatory Visit (INDEPENDENT_AMBULATORY_CARE_PROVIDER_SITE_OTHER): Payer: Medicare Other | Admitting: Psychology

## 2016-03-01 DIAGNOSIS — F3181 Bipolar II disorder: Secondary | ICD-10-CM

## 2016-03-01 DIAGNOSIS — M533 Sacrococcygeal disorders, not elsewhere classified: Secondary | ICD-10-CM | POA: Diagnosis not present

## 2016-03-05 DIAGNOSIS — G518 Other disorders of facial nerve: Secondary | ICD-10-CM | POA: Diagnosis not present

## 2016-03-05 DIAGNOSIS — R51 Headache: Secondary | ICD-10-CM | POA: Diagnosis not present

## 2016-03-05 DIAGNOSIS — M791 Myalgia: Secondary | ICD-10-CM | POA: Diagnosis not present

## 2016-03-05 DIAGNOSIS — G43719 Chronic migraine without aura, intractable, without status migrainosus: Secondary | ICD-10-CM | POA: Diagnosis not present

## 2016-03-05 DIAGNOSIS — M542 Cervicalgia: Secondary | ICD-10-CM | POA: Diagnosis not present

## 2016-03-09 DIAGNOSIS — M1811 Unilateral primary osteoarthritis of first carpometacarpal joint, right hand: Secondary | ICD-10-CM | POA: Diagnosis not present

## 2016-03-09 DIAGNOSIS — M1812 Unilateral primary osteoarthritis of first carpometacarpal joint, left hand: Secondary | ICD-10-CM | POA: Diagnosis not present

## 2016-03-09 DIAGNOSIS — G5601 Carpal tunnel syndrome, right upper limb: Secondary | ICD-10-CM | POA: Diagnosis not present

## 2016-03-09 DIAGNOSIS — Z4789 Encounter for other orthopedic aftercare: Secondary | ICD-10-CM | POA: Diagnosis not present

## 2016-03-15 ENCOUNTER — Ambulatory Visit: Payer: Medicare Other | Admitting: Psychology

## 2016-03-16 DIAGNOSIS — M797 Fibromyalgia: Secondary | ICD-10-CM | POA: Diagnosis not present

## 2016-03-19 DIAGNOSIS — M47816 Spondylosis without myelopathy or radiculopathy, lumbar region: Secondary | ICD-10-CM | POA: Diagnosis not present

## 2016-03-19 DIAGNOSIS — M47814 Spondylosis without myelopathy or radiculopathy, thoracic region: Secondary | ICD-10-CM | POA: Diagnosis not present

## 2016-03-19 DIAGNOSIS — Z79899 Other long term (current) drug therapy: Secondary | ICD-10-CM | POA: Diagnosis not present

## 2016-03-19 DIAGNOSIS — Z79891 Long term (current) use of opiate analgesic: Secondary | ICD-10-CM | POA: Diagnosis not present

## 2016-03-19 DIAGNOSIS — G894 Chronic pain syndrome: Secondary | ICD-10-CM | POA: Diagnosis not present

## 2016-03-19 DIAGNOSIS — M545 Low back pain: Secondary | ICD-10-CM | POA: Diagnosis not present

## 2016-03-29 ENCOUNTER — Ambulatory Visit (INDEPENDENT_AMBULATORY_CARE_PROVIDER_SITE_OTHER): Payer: Medicare Other | Admitting: Psychology

## 2016-03-29 DIAGNOSIS — F3181 Bipolar II disorder: Secondary | ICD-10-CM

## 2016-03-29 DIAGNOSIS — M533 Sacrococcygeal disorders, not elsewhere classified: Secondary | ICD-10-CM | POA: Diagnosis not present

## 2016-04-06 DIAGNOSIS — Z4789 Encounter for other orthopedic aftercare: Secondary | ICD-10-CM | POA: Diagnosis not present

## 2016-04-06 DIAGNOSIS — M1812 Unilateral primary osteoarthritis of first carpometacarpal joint, left hand: Secondary | ICD-10-CM | POA: Diagnosis not present

## 2016-04-06 DIAGNOSIS — G5601 Carpal tunnel syndrome, right upper limb: Secondary | ICD-10-CM | POA: Diagnosis not present

## 2016-04-16 DIAGNOSIS — G43719 Chronic migraine without aura, intractable, without status migrainosus: Secondary | ICD-10-CM | POA: Diagnosis not present

## 2016-04-16 DIAGNOSIS — R51 Headache: Secondary | ICD-10-CM | POA: Diagnosis not present

## 2016-04-16 DIAGNOSIS — G518 Other disorders of facial nerve: Secondary | ICD-10-CM | POA: Diagnosis not present

## 2016-04-16 DIAGNOSIS — G894 Chronic pain syndrome: Secondary | ICD-10-CM | POA: Diagnosis not present

## 2016-04-16 DIAGNOSIS — M791 Myalgia: Secondary | ICD-10-CM | POA: Diagnosis not present

## 2016-04-16 DIAGNOSIS — M47816 Spondylosis without myelopathy or radiculopathy, lumbar region: Secondary | ICD-10-CM | POA: Diagnosis not present

## 2016-04-16 DIAGNOSIS — Z79891 Long term (current) use of opiate analgesic: Secondary | ICD-10-CM | POA: Diagnosis not present

## 2016-04-16 DIAGNOSIS — M47814 Spondylosis without myelopathy or radiculopathy, thoracic region: Secondary | ICD-10-CM | POA: Diagnosis not present

## 2016-04-16 DIAGNOSIS — Z79899 Other long term (current) drug therapy: Secondary | ICD-10-CM | POA: Diagnosis not present

## 2016-04-16 DIAGNOSIS — G5600 Carpal tunnel syndrome, unspecified upper limb: Secondary | ICD-10-CM | POA: Diagnosis not present

## 2016-04-16 DIAGNOSIS — M542 Cervicalgia: Secondary | ICD-10-CM | POA: Diagnosis not present

## 2016-04-17 ENCOUNTER — Ambulatory Visit (INDEPENDENT_AMBULATORY_CARE_PROVIDER_SITE_OTHER): Payer: Medicare Other | Admitting: Psychology

## 2016-04-17 DIAGNOSIS — F3181 Bipolar II disorder: Secondary | ICD-10-CM | POA: Diagnosis not present

## 2016-04-26 DIAGNOSIS — M533 Sacrococcygeal disorders, not elsewhere classified: Secondary | ICD-10-CM | POA: Diagnosis not present

## 2016-05-01 ENCOUNTER — Ambulatory Visit (INDEPENDENT_AMBULATORY_CARE_PROVIDER_SITE_OTHER): Payer: Medicare Other | Admitting: Psychology

## 2016-05-01 DIAGNOSIS — F3181 Bipolar II disorder: Secondary | ICD-10-CM | POA: Diagnosis not present

## 2016-05-10 DIAGNOSIS — G4700 Insomnia, unspecified: Secondary | ICD-10-CM | POA: Diagnosis not present

## 2016-05-10 DIAGNOSIS — F411 Generalized anxiety disorder: Secondary | ICD-10-CM | POA: Diagnosis not present

## 2016-05-10 DIAGNOSIS — F3181 Bipolar II disorder: Secondary | ICD-10-CM | POA: Diagnosis not present

## 2016-05-14 DIAGNOSIS — M47816 Spondylosis without myelopathy or radiculopathy, lumbar region: Secondary | ICD-10-CM | POA: Diagnosis not present

## 2016-05-14 DIAGNOSIS — M47814 Spondylosis without myelopathy or radiculopathy, thoracic region: Secondary | ICD-10-CM | POA: Diagnosis not present

## 2016-05-14 DIAGNOSIS — G5600 Carpal tunnel syndrome, unspecified upper limb: Secondary | ICD-10-CM | POA: Diagnosis not present

## 2016-05-14 DIAGNOSIS — G894 Chronic pain syndrome: Secondary | ICD-10-CM | POA: Diagnosis not present

## 2016-05-14 DIAGNOSIS — Z79891 Long term (current) use of opiate analgesic: Secondary | ICD-10-CM | POA: Diagnosis not present

## 2016-05-14 DIAGNOSIS — Z79899 Other long term (current) drug therapy: Secondary | ICD-10-CM | POA: Diagnosis not present

## 2016-05-16 ENCOUNTER — Ambulatory Visit (INDEPENDENT_AMBULATORY_CARE_PROVIDER_SITE_OTHER): Payer: Medicare Other | Admitting: Psychology

## 2016-05-16 DIAGNOSIS — F3181 Bipolar II disorder: Secondary | ICD-10-CM

## 2016-05-21 ENCOUNTER — Other Ambulatory Visit: Payer: Self-pay | Admitting: Family Medicine

## 2016-05-21 DIAGNOSIS — Z79899 Other long term (current) drug therapy: Secondary | ICD-10-CM | POA: Diagnosis not present

## 2016-05-21 DIAGNOSIS — Z6835 Body mass index (BMI) 35.0-35.9, adult: Secondary | ICD-10-CM | POA: Diagnosis not present

## 2016-05-21 DIAGNOSIS — E6609 Other obesity due to excess calories: Secondary | ICD-10-CM | POA: Diagnosis not present

## 2016-05-21 DIAGNOSIS — N959 Unspecified menopausal and perimenopausal disorder: Secondary | ICD-10-CM | POA: Diagnosis not present

## 2016-05-21 DIAGNOSIS — E7800 Pure hypercholesterolemia, unspecified: Secondary | ICD-10-CM | POA: Diagnosis not present

## 2016-05-21 DIAGNOSIS — E039 Hypothyroidism, unspecified: Secondary | ICD-10-CM | POA: Diagnosis not present

## 2016-05-21 DIAGNOSIS — Z1231 Encounter for screening mammogram for malignant neoplasm of breast: Secondary | ICD-10-CM

## 2016-05-23 DIAGNOSIS — G8918 Other acute postprocedural pain: Secondary | ICD-10-CM | POA: Diagnosis not present

## 2016-05-23 DIAGNOSIS — M1812 Unilateral primary osteoarthritis of first carpometacarpal joint, left hand: Secondary | ICD-10-CM | POA: Diagnosis not present

## 2016-05-30 ENCOUNTER — Ambulatory Visit (INDEPENDENT_AMBULATORY_CARE_PROVIDER_SITE_OTHER): Payer: Medicare Other | Admitting: Psychology

## 2016-05-30 DIAGNOSIS — F3181 Bipolar II disorder: Secondary | ICD-10-CM | POA: Diagnosis not present

## 2016-06-07 DIAGNOSIS — M1812 Unilateral primary osteoarthritis of first carpometacarpal joint, left hand: Secondary | ICD-10-CM | POA: Diagnosis not present

## 2016-06-07 DIAGNOSIS — Z4789 Encounter for other orthopedic aftercare: Secondary | ICD-10-CM | POA: Diagnosis not present

## 2016-06-08 DIAGNOSIS — M1812 Unilateral primary osteoarthritis of first carpometacarpal joint, left hand: Secondary | ICD-10-CM | POA: Diagnosis not present

## 2016-06-08 DIAGNOSIS — Z4789 Encounter for other orthopedic aftercare: Secondary | ICD-10-CM | POA: Diagnosis not present

## 2016-06-11 DIAGNOSIS — M47814 Spondylosis without myelopathy or radiculopathy, thoracic region: Secondary | ICD-10-CM | POA: Diagnosis not present

## 2016-06-11 DIAGNOSIS — M545 Low back pain: Secondary | ICD-10-CM | POA: Diagnosis not present

## 2016-06-11 DIAGNOSIS — Z79899 Other long term (current) drug therapy: Secondary | ICD-10-CM | POA: Diagnosis not present

## 2016-06-11 DIAGNOSIS — M47816 Spondylosis without myelopathy or radiculopathy, lumbar region: Secondary | ICD-10-CM | POA: Diagnosis not present

## 2016-06-11 DIAGNOSIS — Z79891 Long term (current) use of opiate analgesic: Secondary | ICD-10-CM | POA: Diagnosis not present

## 2016-06-11 DIAGNOSIS — G894 Chronic pain syndrome: Secondary | ICD-10-CM | POA: Diagnosis not present

## 2016-06-13 ENCOUNTER — Ambulatory Visit (INDEPENDENT_AMBULATORY_CARE_PROVIDER_SITE_OTHER): Payer: Medicare Other | Admitting: Psychology

## 2016-06-13 DIAGNOSIS — F3181 Bipolar II disorder: Secondary | ICD-10-CM | POA: Diagnosis not present

## 2016-06-18 DIAGNOSIS — K047 Periapical abscess without sinus: Secondary | ICD-10-CM | POA: Diagnosis not present

## 2016-06-18 DIAGNOSIS — R220 Localized swelling, mass and lump, head: Secondary | ICD-10-CM | POA: Diagnosis not present

## 2016-06-25 DIAGNOSIS — M791 Myalgia: Secondary | ICD-10-CM | POA: Diagnosis not present

## 2016-06-25 DIAGNOSIS — G518 Other disorders of facial nerve: Secondary | ICD-10-CM | POA: Diagnosis not present

## 2016-06-25 DIAGNOSIS — G43719 Chronic migraine without aura, intractable, without status migrainosus: Secondary | ICD-10-CM | POA: Diagnosis not present

## 2016-06-25 DIAGNOSIS — M542 Cervicalgia: Secondary | ICD-10-CM | POA: Diagnosis not present

## 2016-06-25 DIAGNOSIS — R51 Headache: Secondary | ICD-10-CM | POA: Diagnosis not present

## 2016-06-27 ENCOUNTER — Ambulatory Visit (INDEPENDENT_AMBULATORY_CARE_PROVIDER_SITE_OTHER): Payer: Medicare Other | Admitting: Psychology

## 2016-06-27 DIAGNOSIS — F3181 Bipolar II disorder: Secondary | ICD-10-CM | POA: Diagnosis not present

## 2016-07-04 DIAGNOSIS — L02435 Carbuncle of right lower limb: Secondary | ICD-10-CM | POA: Diagnosis not present

## 2016-07-05 DIAGNOSIS — Z4789 Encounter for other orthopedic aftercare: Secondary | ICD-10-CM | POA: Diagnosis not present

## 2016-07-05 DIAGNOSIS — M1812 Unilateral primary osteoarthritis of first carpometacarpal joint, left hand: Secondary | ICD-10-CM | POA: Diagnosis not present

## 2016-07-06 DIAGNOSIS — F411 Generalized anxiety disorder: Secondary | ICD-10-CM | POA: Diagnosis not present

## 2016-07-06 DIAGNOSIS — G4700 Insomnia, unspecified: Secondary | ICD-10-CM | POA: Diagnosis not present

## 2016-07-06 DIAGNOSIS — F3181 Bipolar II disorder: Secondary | ICD-10-CM | POA: Diagnosis not present

## 2016-07-09 DIAGNOSIS — M47814 Spondylosis without myelopathy or radiculopathy, thoracic region: Secondary | ICD-10-CM | POA: Diagnosis not present

## 2016-07-09 DIAGNOSIS — G894 Chronic pain syndrome: Secondary | ICD-10-CM | POA: Diagnosis not present

## 2016-07-09 DIAGNOSIS — G5600 Carpal tunnel syndrome, unspecified upper limb: Secondary | ICD-10-CM | POA: Diagnosis not present

## 2016-07-09 DIAGNOSIS — Z79899 Other long term (current) drug therapy: Secondary | ICD-10-CM | POA: Diagnosis not present

## 2016-07-09 DIAGNOSIS — M47816 Spondylosis without myelopathy or radiculopathy, lumbar region: Secondary | ICD-10-CM | POA: Diagnosis not present

## 2016-07-09 DIAGNOSIS — Z79891 Long term (current) use of opiate analgesic: Secondary | ICD-10-CM | POA: Diagnosis not present

## 2016-07-11 ENCOUNTER — Ambulatory Visit (INDEPENDENT_AMBULATORY_CARE_PROVIDER_SITE_OTHER): Payer: Medicare Other | Admitting: Psychology

## 2016-07-11 ENCOUNTER — Ambulatory Visit
Admission: RE | Admit: 2016-07-11 | Discharge: 2016-07-11 | Disposition: A | Discharge status: Home / Self Care | Payer: Medicare Other | Source: Ambulatory Visit | Attending: Family Medicine | Admitting: Family Medicine

## 2016-07-11 DIAGNOSIS — Z1231 Encounter for screening mammogram for malignant neoplasm of breast: Secondary | ICD-10-CM

## 2016-07-11 DIAGNOSIS — F3181 Bipolar II disorder: Secondary | ICD-10-CM | POA: Diagnosis not present

## 2016-07-16 DIAGNOSIS — M542 Cervicalgia: Secondary | ICD-10-CM | POA: Diagnosis not present

## 2016-07-16 DIAGNOSIS — M791 Myalgia: Secondary | ICD-10-CM | POA: Diagnosis not present

## 2016-07-16 DIAGNOSIS — G518 Other disorders of facial nerve: Secondary | ICD-10-CM | POA: Diagnosis not present

## 2016-07-16 DIAGNOSIS — G43719 Chronic migraine without aura, intractable, without status migrainosus: Secondary | ICD-10-CM | POA: Diagnosis not present

## 2016-07-16 DIAGNOSIS — R51 Headache: Secondary | ICD-10-CM | POA: Diagnosis not present

## 2016-07-16 DIAGNOSIS — E039 Hypothyroidism, unspecified: Secondary | ICD-10-CM | POA: Diagnosis not present

## 2016-07-16 DIAGNOSIS — N959 Unspecified menopausal and perimenopausal disorder: Secondary | ICD-10-CM | POA: Diagnosis not present

## 2016-07-19 DIAGNOSIS — M47816 Spondylosis without myelopathy or radiculopathy, lumbar region: Secondary | ICD-10-CM | POA: Diagnosis not present

## 2016-07-25 ENCOUNTER — Ambulatory Visit (INDEPENDENT_AMBULATORY_CARE_PROVIDER_SITE_OTHER): Payer: Medicare Other | Admitting: Psychology

## 2016-07-25 DIAGNOSIS — M1812 Unilateral primary osteoarthritis of first carpometacarpal joint, left hand: Secondary | ICD-10-CM | POA: Diagnosis not present

## 2016-07-25 DIAGNOSIS — F3181 Bipolar II disorder: Secondary | ICD-10-CM | POA: Diagnosis not present

## 2016-07-26 ENCOUNTER — Emergency Department (HOSPITAL_COMMUNITY)
Admission: EM | Admit: 2016-07-26 | Discharge: 2016-07-27 | Disposition: A | Discharge status: Other Acute Inpatient Hospital | Payer: Medicare Other | Source: Home / Self Care | Attending: Emergency Medicine | Admitting: Emergency Medicine

## 2016-07-26 ENCOUNTER — Ambulatory Visit (HOSPITAL_COMMUNITY)
Admission: RE | Admit: 2016-07-26 | Discharge: 2016-07-26 | Disposition: A | Discharge status: Home / Self Care | Payer: Medicare Other | Attending: Psychiatry | Admitting: Psychiatry

## 2016-07-26 ENCOUNTER — Encounter (HOSPITAL_COMMUNITY): Payer: Self-pay

## 2016-07-26 DIAGNOSIS — F3181 Bipolar II disorder: Secondary | ICD-10-CM | POA: Insufficient documentation

## 2016-07-26 DIAGNOSIS — M419 Scoliosis, unspecified: Secondary | ICD-10-CM | POA: Diagnosis not present

## 2016-07-26 DIAGNOSIS — I4891 Unspecified atrial fibrillation: Secondary | ICD-10-CM | POA: Diagnosis not present

## 2016-07-26 DIAGNOSIS — Z79899 Other long term (current) drug therapy: Secondary | ICD-10-CM | POA: Insufficient documentation

## 2016-07-26 DIAGNOSIS — Z888 Allergy status to other drugs, medicaments and biological substances status: Secondary | ICD-10-CM | POA: Diagnosis not present

## 2016-07-26 DIAGNOSIS — Z880 Allergy status to penicillin: Secondary | ICD-10-CM | POA: Diagnosis not present

## 2016-07-26 DIAGNOSIS — E039 Hypothyroidism, unspecified: Secondary | ICD-10-CM | POA: Insufficient documentation

## 2016-07-26 DIAGNOSIS — E785 Hyperlipidemia, unspecified: Secondary | ICD-10-CM | POA: Insufficient documentation

## 2016-07-26 DIAGNOSIS — Z882 Allergy status to sulfonamides status: Secondary | ICD-10-CM | POA: Insufficient documentation

## 2016-07-26 DIAGNOSIS — Z793 Long term (current) use of hormonal contraceptives: Secondary | ICD-10-CM | POA: Insufficient documentation

## 2016-07-26 DIAGNOSIS — F333 Major depressive disorder, recurrent, severe with psychotic symptoms: Secondary | ICD-10-CM

## 2016-07-26 DIAGNOSIS — Z885 Allergy status to narcotic agent status: Secondary | ICD-10-CM | POA: Insufficient documentation

## 2016-07-26 DIAGNOSIS — Z7982 Long term (current) use of aspirin: Secondary | ICD-10-CM | POA: Insufficient documentation

## 2016-07-26 DIAGNOSIS — F411 Generalized anxiety disorder: Secondary | ICD-10-CM | POA: Diagnosis not present

## 2016-07-26 HISTORY — DX: Age-related osteoporosis without current pathological fracture: M81.0

## 2016-07-26 HISTORY — DX: Bipolar II disorder: F31.81

## 2016-07-26 LAB — CBC
HCT: 40.7 % (ref 36.0–46.0)
Hemoglobin: 13.6 g/dL (ref 12.0–15.0)
MCH: 31 pg (ref 26.0–34.0)
MCHC: 33.4 g/dL (ref 30.0–36.0)
MCV: 92.7 fL (ref 78.0–100.0)
Platelets: 240 10*3/uL (ref 150–400)
RBC: 4.39 MIL/uL (ref 3.87–5.11)
RDW: 13 % (ref 11.5–15.5)
WBC: 8.9 10*3/uL (ref 4.0–10.5)

## 2016-07-26 LAB — COMPREHENSIVE METABOLIC PANEL
ALT: 16 U/L (ref 14–54)
AST: 21 U/L (ref 15–41)
Albumin: 4.1 g/dL (ref 3.5–5.0)
Alkaline Phosphatase: 46 U/L (ref 38–126)
Anion gap: 11 (ref 5–15)
BUN: 13 mg/dL (ref 6–20)
CO2: 26 mmol/L (ref 22–32)
Calcium: 9.3 mg/dL (ref 8.9–10.3)
Chloride: 101 mmol/L (ref 101–111)
Creatinine, Ser: 0.95 mg/dL (ref 0.44–1.00)
GFR calc Af Amer: >60 mL/min (ref >60)
GFR calc non Af Amer: >60 mL/min (ref >60)
Glucose, Bld: 98 mg/dL (ref 65–99)
Potassium: 3.7 mmol/L (ref 3.5–5.1)
Sodium: 138 mmol/L (ref 135–145)
Total Bilirubin: 0.4 mg/dL (ref 0.3–1.2)
Total Protein: 7.3 g/dL (ref 6.5–8.1)

## 2016-07-26 LAB — RAPID URINE DRUG SCREEN, HOSP PERFORMED
Amphetamines: NOT DETECTED
Barbiturates: NOT DETECTED
Benzodiazepines: NOT DETECTED
Cocaine: NOT DETECTED
Opiates: POSITIVE — AB
Tetrahydrocannabinol: NOT DETECTED

## 2016-07-26 LAB — ETHANOL: Alcohol, Ethyl (B): <5 mg/dL (ref <5)

## 2016-07-26 LAB — ACETAMINOPHEN LEVEL: Acetaminophen (Tylenol), Serum: <10 ug/mL — ABNORMAL LOW (ref 10–30)

## 2016-07-26 LAB — SALICYLATE LEVEL: Salicylate Lvl: <7 mg/dL (ref 2.8–30.0)

## 2016-07-26 NOTE — H&P (Signed)
Behavioral Health Medical Screening Exam  Meredith [name] is an 66 y.o. female.  Total Time spent with patient: 15 minutes  Psychiatric Specialty Exam: Physical Exam  Constitutional: She is oriented to person, place, and time. She appears well-developed and well-nourished. No distress.  HENT:  Head: Normocephalic and atraumatic.  Right Ear: External ear normal.  Left Ear: External ear normal.  Eyes: Pupils are equal, round, and reactive to light. Right eye exhibits no discharge. Left eye exhibits no discharge. No scleral icterus.  Neck: Normal range of motion.  Cardiovascular: Normal rate, regular rhythm and normal heart sounds.   Respiratory: Effort normal and breath sounds normal. No respiratory distress.  Musculoskeletal: Normal range of motion.  Splint on left hand/wrist due to previous wrist injury. Reports surgical appointment next week.  Neurological: She is alert and oriented to person, place, and time.  Skin: She is not diaphoretic.    Review of Systems  Respiratory: Negative for cough and shortness of breath.   Cardiovascular: Negative for chest pain and palpitations.  Neurological: Positive for headaches. Negative for dizziness.  Psychiatric/Behavioral: Positive for depression and hallucinations. Negative for memory loss, substance abuse and suicidal ideas. The patient is nervous/anxious and has insomnia.   All other systems reviewed and are negative.   Blood pressure (!) 159/79, pulse 75, temperature 99.5 F (37.5 C), temperature source Oral, resp. rate 20, SpO2 99 %.There is no height or weight on file to calculate BMI.  General Appearance: Casual and Well Groomed  Eye Contact:  Good  Speech:  Clear and Coherent and Normal Rate  Volume:  Normal  Mood:  Anxious, Depressed, Dysphoric, Hopeless, Irritable and Worthless  Affect:  Congruent and Depressed  Thought Process:  Coherent and Goal Directed  Orientation:  Full (Time, Place, and Person)  Thought Content:   Logical, Hallucinations: Auditory Command:  hears voices telling her to harm herself, voices do not tell her how to harm herself and Rumination  Suicidal Thoughts:  No  Homicidal Thoughts:  No  Memory:  Immediate;   Good Recent;   Good Remote;   Good  Judgement:  Impaired  Insight:  Fair  Psychomotor Activity:  Normal  Concentration: Concentration: Good and Attention Span: Good  Recall:  Good  Fund of Knowledge:Good  Language: Good  Akathisia:  No  Handed:  Right  AIMS (if indicated):     Assets:  Communication Skills Desire for Improvement Financial Resources/Insurance Housing Leisure Time Transportation  Sleep:       Musculoskeletal: Strength & Muscle Tone: within normal limits Gait & Station: normal   Blood pressure (!) 159/79, pulse 75, temperature 99.5 F (37.5 C), temperature source Oral, resp. rate 20, SpO2 99 %.  Recommendations:  Based on my evaluation the patient does not appear to have an emergency medical condition. Patient states that the voices started after Seroquel was stopped and Geodon started. Reports that she became psychotic the last time her medications were changed and required inpatient treatment. Patient stated multiple times that she knows when she needs admission and that she needs to be admitted now. Due to patient's medical history, will send for medical clearance.   Rozetta Nunnery, NP 07/26/2016, 11:48 PM

## 2016-07-26 NOTE — ED Triage Notes (Signed)
PT BROUGHT FROM [facility] FOR MAJOR DEPRESSION AND HEARING VOICES. PT STS SHE WAS TAKEN OFF OF SEROQUEL AND PUT ON GEODON FOR BIPOLAR TYPE II, AND NOW SHE IS MENTALLY UNSTABLE. SHE STS THE VOICES ARE TELLING HER TO HURT HERSELF, BUT SHE WILL NOT LISTEN TO THEM. SHE STS SHE TOLD HER PSYCHIATRIST SHE WILL NEED TO BE INPT IF HER MEDICATION IS CHANGED, BUT THEY DID NOT LISTEN. SHE DENIES SI/HI AT THIS TIME.

## 2016-07-26 NOTE — ED Notes (Signed)
Bed: [facility]PT3 Expected date:  Expected time:  Means of arrival:  Comments: 
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
## 2016-07-27 ENCOUNTER — Encounter (HOSPITAL_COMMUNITY): Payer: Self-pay

## 2016-07-27 ENCOUNTER — Inpatient Hospital Stay (HOSPITAL_COMMUNITY)
Admission: EM | Admit: 2016-07-27 | Discharge: 2016-07-31 | DRG: 885 | Disposition: A | Discharge status: Home / Self Care | Payer: Medicare Other | Source: Intra-hospital | Attending: Psychiatry | Admitting: Psychiatry

## 2016-07-27 DIAGNOSIS — Z79899 Other long term (current) drug therapy: Secondary | ICD-10-CM

## 2016-07-27 DIAGNOSIS — E039 Hypothyroidism, unspecified: Secondary | ICD-10-CM | POA: Diagnosis not present

## 2016-07-27 DIAGNOSIS — K219 Gastro-esophageal reflux disease without esophagitis: Secondary | ICD-10-CM | POA: Diagnosis not present

## 2016-07-27 DIAGNOSIS — G894 Chronic pain syndrome: Secondary | ICD-10-CM | POA: Diagnosis not present

## 2016-07-27 DIAGNOSIS — M810 Age-related osteoporosis without current pathological fracture: Secondary | ICD-10-CM | POA: Diagnosis present

## 2016-07-27 DIAGNOSIS — F3181 Bipolar II disorder: Secondary | ICD-10-CM | POA: Diagnosis present

## 2016-07-27 DIAGNOSIS — F323 Major depressive disorder, single episode, severe with psychotic features: Principal | ICD-10-CM | POA: Diagnosis present

## 2016-07-27 DIAGNOSIS — F3164 Bipolar disorder, current episode mixed, severe, with psychotic features: Secondary | ICD-10-CM | POA: Diagnosis present

## 2016-07-27 DIAGNOSIS — M797 Fibromyalgia: Secondary | ICD-10-CM | POA: Diagnosis not present

## 2016-07-27 DIAGNOSIS — Z79890 Hormone replacement therapy (postmenopausal): Secondary | ICD-10-CM | POA: Diagnosis not present

## 2016-07-27 DIAGNOSIS — I4891 Unspecified atrial fibrillation: Secondary | ICD-10-CM | POA: Diagnosis present

## 2016-07-27 DIAGNOSIS — E785 Hyperlipidemia, unspecified: Secondary | ICD-10-CM | POA: Diagnosis not present

## 2016-07-27 DIAGNOSIS — F411 Generalized anxiety disorder: Secondary | ICD-10-CM | POA: Diagnosis not present

## 2016-07-27 DIAGNOSIS — M419 Scoliosis, unspecified: Secondary | ICD-10-CM | POA: Diagnosis not present

## 2016-07-27 DIAGNOSIS — G4700 Insomnia, unspecified: Secondary | ICD-10-CM | POA: Diagnosis not present

## 2016-07-27 LAB — LIPID PANEL
Cholesterol: 224 mg/dL — ABNORMAL HIGH (ref 0–200)
HDL: 118 mg/dL (ref >40)
LDL Cholesterol: 88 mg/dL (ref 0–99)
Total CHOL/HDL Ratio: 1.9 RATIO
Triglycerides: 92 mg/dL (ref <150)
VLDL: 18 mg/dL (ref 0–40)

## 2016-07-27 LAB — TSH: TSH: 5.32 u[IU]/mL — ABNORMAL HIGH (ref 0.350–4.500)

## 2016-07-27 MED ORDER — QUETIAPINE FUMARATE ER 200 MG PO TB24
200.0000 mg | ORAL_TABLET | Freq: Every day | ORAL | Status: DC
  Administered 2016-07-27 – 2016-07-30 (×4): 200 mg via ORAL
  Filled 2016-07-27: qty 7
  Filled 2016-07-27 (×5): qty 1

## 2016-07-27 MED ORDER — VERAPAMIL HCL ER 120 MG PO TBCR
120.0000 mg | EXTENDED_RELEASE_TABLET | Freq: Every day | ORAL | Status: DC
  Administered 2016-07-27 – 2016-07-30 (×4): 120 mg via ORAL
  Filled 2016-07-27 (×6): qty 1

## 2016-07-27 MED ORDER — MAGNESIUM HYDROXIDE 400 MG/5ML PO SUSP: 30.0000 mL | Freq: Every day | ORAL | Status: DC | PRN

## 2016-07-27 MED ORDER — SENNOSIDES-DOCUSATE SODIUM 8.6-50 MG PO TABS
1.0000 | ORAL_TABLET | Freq: Two times a day (BID) | ORAL | Status: DC
  Administered 2016-07-27 – 2016-07-31 (×8): 1 via ORAL
  Filled 2016-07-27 (×12): qty 1

## 2016-07-27 MED ORDER — LEVOTHYROXINE SODIUM 112 MCG PO TABS
112.0000 ug | ORAL_TABLET | Freq: Every day | ORAL | Status: DC
  Administered 2016-07-27 – 2016-07-31 (×5): 112 ug via ORAL
  Filled 2016-07-27 (×8): qty 1

## 2016-07-27 MED ORDER — ONDANSETRON HCL 4 MG PO TABS: 4.0000 mg | ORAL_TABLET | Freq: Three times a day (TID) | ORAL | Status: DC | PRN

## 2016-07-27 MED ORDER — METOPROLOL SUCCINATE ER 50 MG PO TB24
50.0000 mg | ORAL_TABLET | Freq: Every day | ORAL | Status: DC
  Filled 2016-07-27: qty 1

## 2016-07-27 MED ORDER — CLOTRIMAZOLE 1 % EX CREA: TOPICAL_CREAM | Freq: Two times a day (BID) | CUTANEOUS | Status: DC | PRN

## 2016-07-27 MED ORDER — HYDROXYZINE HCL 50 MG PO TABS
50.0000 mg | ORAL_TABLET | Freq: Three times a day (TID) | ORAL | Status: DC | PRN
  Administered 2016-07-27 – 2016-07-31 (×5): 50 mg via ORAL
  Filled 2016-07-27 (×5): qty 1
  Filled 2016-07-27: qty 10

## 2016-07-27 MED ORDER — ALUM & MAG HYDROXIDE-SIMETH 200-200-20 MG/5ML PO SUSP: 30.0000 mL | ORAL | Status: DC | PRN

## 2016-07-27 MED ORDER — HYDROCODONE-ACETAMINOPHEN 5-325 MG PO TABS
1.5000 | ORAL_TABLET | Freq: Two times a day (BID) | ORAL | Status: DC | PRN
  Administered 2016-07-27 – 2016-07-31 (×9): 1.5 via ORAL
  Filled 2016-07-27 (×18): qty 2

## 2016-07-27 MED ORDER — PANTOPRAZOLE SODIUM 40 MG PO TBEC
40.0000 mg | DELAYED_RELEASE_TABLET | Freq: Every day | ORAL | Status: DC
  Administered 2016-07-27 – 2016-07-31 (×5): 40 mg via ORAL
  Filled 2016-07-27 (×7): qty 1

## 2016-07-27 MED ORDER — VERAPAMIL HCL ER 120 MG PO TBCR
120.0000 mg | EXTENDED_RELEASE_TABLET | Freq: Every day | ORAL | Status: DC
  Filled 2016-07-27: qty 1

## 2016-07-27 MED ORDER — KETOROLAC TROMETHAMINE 60 MG/2ML IM SOLN
60.0000 mg | Freq: Once | INTRAMUSCULAR | Status: AC
  Administered 2016-07-27: 60 mg via INTRAMUSCULAR
  Filled 2016-07-27: qty 2

## 2016-07-27 MED ORDER — PREGABALIN 75 MG PO CAPS
150.0000 mg | ORAL_CAPSULE | Freq: Three times a day (TID) | ORAL | Status: DC
  Administered 2016-07-27 – 2016-07-31 (×13): 150 mg via ORAL
  Filled 2016-07-27 (×13): qty 2

## 2016-07-27 MED ORDER — HYDROCODONE-ACETAMINOPHEN 7.5-325 MG PO TABS
1.0000 | ORAL_TABLET | ORAL | Status: AC
  Administered 2016-07-27: 1 via ORAL
  Filled 2016-07-27: qty 1

## 2016-07-27 MED ORDER — ESTRADIOL 1 MG PO TABS
1.5000 mg | ORAL_TABLET | Freq: Every day | ORAL | Status: DC
  Administered 2016-07-27 – 2016-07-31 (×5): 1.5 mg via ORAL
  Filled 2016-07-27 (×7): qty 1.5

## 2016-07-27 MED ORDER — ACETAMINOPHEN 325 MG PO TABS: 650.0000 mg | ORAL_TABLET | Freq: Four times a day (QID) | ORAL | Status: DC | PRN

## 2016-07-27 MED ORDER — FERROUS SULFATE 325 (65 FE) MG PO TABS
325.0000 mg | ORAL_TABLET | Freq: Every day | ORAL | Status: DC
  Administered 2016-07-27 – 2016-07-31 (×5): 325 mg via ORAL
  Filled 2016-07-27 (×7): qty 1

## 2016-07-27 MED ORDER — TRAZODONE HCL 50 MG PO TABS
50.0000 mg | ORAL_TABLET | Freq: Once | ORAL | Status: AC
  Administered 2016-07-27: 50 mg via ORAL
  Filled 2016-07-27: qty 1

## 2016-07-27 MED ORDER — TRAZODONE HCL 100 MG PO TABS
100.0000 mg | ORAL_TABLET | Freq: Every day | ORAL | Status: DC
  Administered 2016-07-27: 100 mg via ORAL
  Filled 2016-07-27 (×2): qty 1

## 2016-07-27 MED ORDER — CALCIUM CARBONATE 1250 (500 CA) MG PO TABS
1.0000 | ORAL_TABLET | Freq: Every day | ORAL | Status: DC
  Administered 2016-07-27 – 2016-07-31 (×5): 500 mg via ORAL
  Filled 2016-07-27 (×7): qty 1

## 2016-07-27 NOTE — BHH Suicide Risk Assessment (Signed)
[facility] Admission Suicide Risk Assessment   Nursing information obtained from:    Demographic factors:    Current Mental Status:    Loss Factors:    Historical Factors:    Risk Reduction Factors:     Total Time spent with patient: 1 hour Principal Problem: <principal problem not specified> Diagnosis:   Patient Active Problem List   Diagnosis Date Noted  . 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 Atrial fibrillation [facility]) [I48.91] 11/14/2015   Subjective Data: Patient is a 66 year old female who came to [facility] in as a walk-in were reports of becoming increasingly depressed due to recent change in her medication by her outpatient psychiatrist. Patient stated that she was hearing voices which were commanding her to kill herself. She had a that she was not sleeping, eating, was struggling with pain, sadness, having crying spells, mood irritability. She also stated that she was anxious.  Continued Clinical Symptoms:  Alcohol Use Disorder Identification Test Final Score (AUDIT): 0 The "Alcohol Use Disorders Identification Test", Guidelines for Use in Primary Care, Second Edition.  World Pharmacologist [facility]). Score between 0-7:  no or low risk or alcohol related problems. Score between 8-15:  moderate risk of alcohol related problems. Score between 16-19:  high risk of alcohol related problems. Score 20 or above:  warrants further diagnostic evaluation for alcohol dependence and treatment.   CLINICAL FACTORS:   Severe Anxiety and/or Agitation Bipolar Disorder:   Mixed State   Musculoskeletal: Strength & Muscle Tone: within normal limits Gait & Station: normal Patient leans: N/A  Psychiatric Specialty Exam: Physical Exam  ROS  Blood pressure 122/66, pulse 82, temperature 97.6 F (36.4 C), temperature source Oral, resp. rate 16, height 4\' 10"  (1.473 m), weight 79.8 kg (176 lb), SpO2 98 %.Body mass index is 36.78 kg/m.     COGNITIVE FEATURES THAT  CONTRIBUTE TO RISK:  Loss of executive function and Polarized thinking    SUICIDE RISK:   Severe:  Frequent, intense, and enduring suicidal ideation, specific plan, no subjective intent, but some objective markers of intent (i.e., choice of lethal method), the method is accessible, some limited preparatory behavior, evidence of impaired self-control, severe dysphoria/symptomatology, multiple risk factors present, and few if any protective factors, particularly a lack of social support.  PLAN OF CARE: Patient is admitted to [facility], 500 half a stabilization and treatment of worsening of depression with auditory command hallucinations telling her to kill herself. Patient's medications and be adjusted to help improve her depression, communication skills and also for patient to be no longer having auditory hallucinations. Patient also has a lot of medical issues which include migraine, has had back surgeries, is on pain management through her clinic, and recently had surgery on her left arm.  I certify that inpatient services furnished can reasonably be expected to improve the patient's condition.   Hampton Abbot, MD 07/27/2016, 11:03 AM

## 2016-07-27 NOTE — Progress Notes (Signed)
Nutrition Brief Note  Patient identified on the Malnutrition Screening Tool (MST) Report  Wt Readings from Last 15 Encounters:  07/27/16 176 lb (79.8 kg)  07/26/16 178 lb (80.7 kg)  11/14/15 171 lb 1.9 oz (77.6 kg)    Body mass index is 36.78 kg/m. Patient meets criteria for obesity based on current BMI.  Current diet order is Regular and pt is eating as desired for meals and snacks. Labs and medications reviewed. Pt admitted for depression and auditory hallucinations. Pt reports decreased appetite with depressed symptoms.  No nutrition interventions warranted at this time. If nutrition issues arise, please consult RD.     Jarome Matin, MS, RD, LDN, [facility] Inpatient Clinical Dietitian Pager # [phone] After hours/weekend pager # [phone]

## 2016-07-27 NOTE — Tx Team (Signed)
Interdisciplinary Treatment and Diagnostic Plan Update  07/27/2016 Time of Session: 4:22 PM  Meredith [name] MRN: [id number]  Principal Diagnosis: Severe major depression with psychotic features [facility])  Secondary Diagnoses: Principal Problem:   Severe major depression with psychotic features ([place])   Current Medications:  Current Facility-Administered Medications  Medication Dose Route Frequency Provider Last Rate Last Dose  . acetaminophen (TYLENOL) tablet 650 mg  650 mg Oral Q6H PRN Rozetta Nunnery, NP      . alum & mag hydroxide-simeth (MAALOX/MYLANTA) 200-200-20 MG/5ML suspension 30 mL  30 mL Oral Q4H PRN Lindon Romp A, NP      . calcium carbonate (OS-CAL - dosed in mg of elemental calcium) tablet 500 mg of elemental calcium  1 tablet Oral Daily Nwoko, Agnes I, NP   500 mg of elemental calcium at 07/27/16 1433  . clotrimazole (LOTRIMIN) 1 % cream   Topical BID PRN Lindon Romp A, NP      . estradiol (ESTRACE) tablet 1.5 mg  1.5 mg Oral Daily Nwoko, Agnes I, NP   1.5 mg at 07/27/16 1434  . ferrous sulfate tablet 325 mg  325 mg Oral Daily Lindell Spar I, NP   325 mg at 07/27/16 1433  . HYDROcodone-acetaminophen (NORCO/VICODIN) 5-325 MG per tablet 1.5 tablet  1.5 tablet Oral BID PRN Lindell Spar I, NP   1.5 tablet at 07/27/16 1430  . hydrOXYzine (ATARAX/VISTARIL) tablet 50 mg  50 mg Oral TID PRN Hampton Abbot, MD      . levothyroxine (SYNTHROID, LEVOTHROID) tablet 112 mcg  112 mcg Oral QAC breakfast Lindon Romp A, NP   112 mcg at 07/27/16 1962  . magnesium hydroxide (MILK OF MAGNESIA) suspension 30 mL  30 mL Oral Daily PRN Lindon Romp A, NP      . ondansetron (ZOFRAN) tablet 4 mg  4 mg Oral TID PRN Lindon Romp A, NP      . pantoprazole (PROTONIX) EC tablet 40 mg  40 mg Oral Daily Lindon Romp A, NP   40 mg at 07/27/16 0823  . pregabalin (LYRICA) capsule 150 mg  150 mg Oral TID Lindell Spar I, NP   150 mg at 07/27/16 1432  . QUEtiapine (SEROQUEL XR) 24 hr tablet 200 mg  200 mg Oral QHS  Hampton Abbot, MD      . senna-docusate (Senokot-S) tablet 1 tablet  1 tablet Oral BID Ursula Alert, MD      . traZODone (DESYREL) tablet 100 mg  100 mg Oral QHS Hampton Abbot, MD      . verapamil (CALAN-SR) CR tablet 120 mg  120 mg Oral QHS Lindon Romp A, NP        PTA Medications: Prescriptions Prior to Admission  Medication Sig Dispense Refill Last Dose  . calcium carbonate (OS-CAL - DOSED IN MG OF ELEMENTAL CALCIUM) 1250 (500 Ca) MG tablet Take 1 tablet by mouth.   Past Week at Unknown time  . cholecalciferol (VITAMIN D) 1000 units tablet Take 1,000 Units by mouth daily.   Past Week at Unknown time  . clotrimazole (LOTRIMIN) 1 % cream Apply 1 application topically 2 (two) times daily. under brest   Past Week at Unknown time  . diphenhydrAMINE (BENADRYL) 50 MG capsule Take 50 mg by mouth every 6 (six) hours as needed for allergies.   Past Week at Unknown time  . esomeprazole (NEXIUM) 40 MG capsule Take 40 mg by mouth daily as needed.    Past Week at Unknown time  . estradiol (  ESTRACE) 1 MG tablet Take 1.5 mg by mouth daily.    Past Week at Unknown time  . ferrous sulfate 325 (65 FE) MG tablet Take 325 mg by mouth daily with breakfast.   Past Week at Unknown time  . HYDROcodone-acetaminophen (NORCO) 7.5-325 MG tablet Take 1 tablet by mouth 2 (two) times daily as needed for moderate pain.    Past Week at Unknown time  . levothyroxine (SYNTHROID, LEVOTHROID) 112 MCG tablet Take 112 mcg by mouth daily before breakfast.   Past Week at Unknown time  . ondansetron (ZOFRAN) 4 MG tablet Take 4 mg by mouth 3 (three) times daily as needed. As needed for nausea   Past Week at Unknown time  . pregabalin (LYRICA) 150 MG capsule Take 150 mg by mouth 3 (three) times daily.   Past Week at Unknown time  . senna-docusate (PERI-COLACE) 8.6-50 MG tablet Take 2 tablets by mouth 2 (two) times daily.   Past Week at Unknown time  . venlafaxine XR (EFFEXOR-XR) 150 MG 24 hr capsule Take 150 mg by mouth daily with  breakfast.   Past Week at Unknown time  . verapamil (VERELAN PM) 120 MG 24 hr capsule Take 120 mg by mouth at bedtime.    Past Week at Unknown time  . metoprolol succinate (TOPROL-XL) 50 MG 24 hr tablet Take 1 tablet (50 mg total) by mouth daily. Take with or immediately following a meal. 90 tablet 3     Treatment Modalities: Medication Management, Group therapy, Case management,  1 to 1 session with clinician, Psychoeducation, Recreational therapy.   Physician Treatment Plan for Primary Diagnosis: Severe major depression with psychotic features ([place]) Long Term Goal(s): Improvement in symptoms so as ready for discharge  Short Term Goals: Ability to demonstrate self-control will improve Ability to identify and develop effective coping behaviors will improve Ability to maintain clinical measurements within normal limits will improve Ability to verbalize feelings will improve Ability to demonstrate self-control will improve Ability to identify and develop effective coping behaviors will improve Ability to maintain clinical measurements within normal limits will improve  Medication Management: Evaluate patient's response, side effects, and tolerance of medication regimen.  Therapeutic Interventions: 1 to 1 sessions, Unit Group sessions and Medication administration.  Evaluation of Outcomes: Progressing  Physician Treatment Plan for Secondary Diagnosis: Principal Problem:   Severe major depression with psychotic features ([place])   Long Term Goal(s): Improvement in symptoms so as ready for discharge  Short Term Goals: Ability to demonstrate self-control will improve Ability to identify and develop effective coping behaviors will improve Ability to maintain clinical measurements within normal limits will improve Ability to verbalize feelings will improve Ability to demonstrate self-control will improve Ability to identify and develop effective coping behaviors will improve Ability to  maintain clinical measurements within normal limits will improve  Medication Management: Evaluate patient's response, side effects, and tolerance of medication regimen.  Therapeutic Interventions: 1 to 1 sessions, Unit Group sessions and Medication administration.  Evaluation of Outcomes: Progressing   RN Treatment Plan for Primary Diagnosis: Severe major depression with psychotic features ([place]) Long Term Goal(s): Knowledge of disease and therapeutic regimen to maintain health will improve  Short Term Goals: Ability to identify and develop effective coping behaviors will improve and Compliance with prescribed medications will improve  Medication Management: RN will administer medications as ordered by provider, will assess and evaluate patient's response and provide education to patient for prescribed medication. RN will report any adverse and/or side effects to prescribing  provider.  Therapeutic Interventions: 1 on 1 counseling sessions, Psychoeducation, Medication administration, Evaluate responses to treatment, Monitor vital signs and CBGs as ordered, Perform/monitor CIWA, COWS, AIMS and Fall Risk screenings as ordered, Perform wound care treatments as ordered.  Evaluation of Outcomes: Progressing   LCSW Treatment Plan for Primary Diagnosis: Severe major depression with psychotic features ([place]) Long Term Goal(s): Safe transition to appropriate next level of care at discharge, Engage patient in therapeutic group addressing interpersonal concerns.  Short Term Goals: Engage patient in aftercare planning with referrals and resources  Therapeutic Interventions: Assess for all discharge needs, 1 to 1 time with Social worker, Explore available resources and support systems, Assess for adequacy in community support network, Educate family and significant other(s) on suicide prevention, Complete Psychosocial Assessment, Interpersonal group therapy.  Evaluation of Outcomes: Met  Return home,  follow up outpt   Progress in Treatment: Attending groups: Yes Participating in groups: Yes Taking medication as prescribed: Yes Toleration medication: Yes, no side effects reported at this time Family/Significant other contact made: No Patient understands diagnosis: Yes AEB asking for a "medication adjustment, nothing more." Discussing patient identified problems/goals with staff: Yes Medical problems stabilized or resolved: Yes Denies suicidal/homicidal ideation: Yes Issues/concerns per patient self-inventory: None Other: N/A  New problem(s) identified: None identified at this time.   New Short Term/Long Term Goal(s): None identified at this time.   Discharge Plan or Barriers:   Reason for Continuation of Hospitalization:  Hallucinations  Mania  Medication stabilization   Estimated Length of Stay: 3-5 days  Attendees: Patient: 07/27/2016  4:22 PM  Physician: Ursula Alert, MD 07/27/2016  4:22 PM  Nursing: Hoy Register, RN 07/27/2016  4:22 PM  RN Care Manager: Lars Pinks, RN 07/27/2016  4:22 PM  Social Worker: Ripley Fraise 07/27/2016  4:22 PM  Recreational Therapist: Laretta Bolster  07/27/2016  4:22 PM  Other: Norberto Sorenson 07/27/2016  4:22 PM  Other:  07/27/2016  4:22 PM    Scribe for Treatment Team:  Roque Lias LCSW 07/27/2016 4:22 PM

## 2016-07-27 NOTE — ED Provider Notes (Signed)
[facility] DEPT Provider Note   CSN: [id number] Arrival date & time: 07/26/16  2201     History   Chief Complaint Chief Complaint  Patient presents with  . Depression    HPI Meredith [name] is a 66 y.o. female.   The history is provided by the patient. No language interpreter was used.      tr 3  anhednia  stopped awreoqouel started geodone waited week and a half to let ,edication work new onset of additional sympytoms  40 mg of geondon = mood stabilizer (alot of talking. more emotional,   tueaday night @ 9pm really depressed (a lot of crying, trouble sleeping,    decreased appettite  racing thoughts @ 2pm today   notes auditiory halluciation (no plan, hurt her plan ) , migraines  off one medication onto another goes into deep depression and goes into pscychosis  denies SI /   Pain-migraine- throughts- depression (is too much)   Past Medical History:  Diagnosis Date  . Anxiety   . Atrial fibrillation ([place])   . Bipolar 2 disorder ([place])   . Depression   . History of cardioversion    x2  . Hyperlipidemia   . Hypothyroidism   . Migraine headache   . Osteoporosis     Patient Active Problem List   Diagnosis Date Noted  . Atrial fibrillation ([place]) 11/14/2015    Past Surgical History:  Procedure Laterality Date  . ABLATION    . BACK SURGERY  2014  . CARDIOVERSION  12/2013, 01/2014   x2  . KNEE SURGERY     ORTHOSCOPIC  . LUNG REMOVAL, PARTIAL Right    BENIGN LUNG CYST  . THORACIC SPINE SURGERY Right   . THUMB ARTHROSCOPY      OB History    No data available       Home Medications    Prior to Admission medications   Medication Sig Start Date End Date Taking? Authorizing Provider  eletriptan (RELPAX) 40 MG tablet Take 40 mg by mouth as needed for migraine or headache. May repeat in 2 hours if headache persists or recurs.    [provider]  esomeprazole (NEXIUM) 40 MG capsule Take 40 mg by mouth daily at 12 noon.    [provider]  estradiol (ESTRACE) 1 MG tablet Take 1 mg by mouth daily.    [provider]  ferrous fumarate (FERRETTS) 325 (106 Fe) MG TABS tablet Take 1 tablet by mouth daily.    [provider]  HYDROcodone-acetaminophen (NORCO) 7.5-325 MG tablet Take 1 tablet by mouth every 6 (six) hours as needed for moderate pain.    [provider]  levothyroxine (SYNTHROID, LEVOTHROID) 112 MCG tablet Take 112 mcg by mouth daily before breakfast.    [provider]  metoprolol succinate (TOPROL-XL) 50 MG 24 hr tablet Take 1 tablet (50 mg total) by mouth daily. Take with or immediately following a meal. 11/14/15 02/12/16  [name], Wonda Cheng, MD  montelukast (SINGULAIR) 10 MG tablet Take 10 mg by mouth at bedtime.    [provider]  ondansetron (ZOFRAN) 4 MG tablet Take 4 mg by mouth 3 (three) times daily as needed. As needed for nausea 09/13/15   [provider]  pravastatin (PRAVACHOL) 40 MG tablet Take 40 mg by mouth daily.    [provider]  pregabalin (LYRICA) 300 MG capsule Take 150 mg by mouth daily.     [provider]  QUEtiapine (SEROQUEL) 200 MG  tablet Take 50 mg by mouth at bedtime.     [provider]  venlafaxine XR (EFFEXOR-XR) 150 MG 24 hr capsule Take 150 mg by mouth daily with breakfast.    [provider]  verapamil (VERELAN PM) 120 MG 24 hr capsule Take 120 mg by mouth daily. 10/25/15   [provider]    Family History Family History  Problem Relation Age of Onset  . Atrial fibrillation Brother     Social History Social History  Substance Use Topics  . Smoking status: Never Smoker  . Smokeless tobacco: Never Used  . Alcohol use No     Allergies   Amoxicillin; Ampicillin; Celebrex [celecoxib]; Codeine; Penicillins; Sulfa antibiotics; and Tricyclic antidepressants   Review of Systems Review of Systems  Constitutional: Positive for appetite change.  Neurological: Positive for  headaches.  Psychiatric/Behavioral: Positive for hallucinations. Negative for suicidal ideas.  All other systems reviewed and are negative.    Physical Exam Updated Vital Signs BP (!) 152/77 (BP Location: Right Arm)   Pulse 78   Temp 98.8 F (37.1 C) (Oral)   Resp 20   Ht 5' (1.524 m)   Wt 178 lb (80.7 kg)   LMP  (LMP Unknown)   SpO2 96%   BMI 34.76 kg/m   Physical Exam  musc- splint present on left wrist and nt removed.    psyc- emotionally liable , alternating between crying and anger.  ED Treatments / Results  DIAGNOSTIC STUDIES: Oxygen Saturation is 96% on RA, adequate by my interpretation.   COORDINATION OF CARE: 12:39 AM-Discussed next steps with pt. Pt verbalized understanding and is agreeable with the plan.   Labs (all labs ordered are listed, but only abnormal results are displayed) Labs Reviewed  ACETAMINOPHEN LEVEL - Abnormal; Notable for the following:       Result Value   Acetaminophen (Tylenol), Serum <10 (*)    All other components within normal limits  RAPID URINE DRUG SCREEN, HOSP PERFORMED - Abnormal; Notable for the following:    Opiates POSITIVE (*)    All other components within normal limits  COMPREHENSIVE METABOLIC PANEL  ETHANOL  SALICYLATE LEVEL  CBC    EKG ECG shows normal sinus rhythm with a rate of 71, no ectopy. Normal axis. Normal P wave. Normal QRS. Normal intervals. Normal ST and T waves. Impression: normal ECG. When compared with ECG of 11/14/2015, no significant changes are seen.   Procedures Procedures (including critical care time)  Medications Ordered in ED Medications  HYDROcodone-acetaminophen (NORCO) 7.5-325 MG per tablet 1 tablet (1 tablet Oral Given 07/27/16 0053)  ketorolac (TORADOL) injection 60 mg (60 mg Intramuscular Given 07/27/16 0053)     Initial Impression / Assessment and Plan / ED Course  I have reviewed the triage vital signs and the nursing notes.  Pertinent lab results that were available during my  care of the patient were reviewed by me and considered in my medical decision making (see chart for details).  Major depression with psychotic features. Old records are reviewed and she had been seen at behavioral health hospital earlier today and sent here for medical clearance. She does have history of paroxysmal atrial fibrillation, but ECG shows she is in sinus rhythm currently. She is felt to be medically stable for admission to psychiatric facility.  Final Clinical Impressions(s) / ED Diagnoses   Final diagnoses:  Severe episode of recurrent major depressive disorder, with psychotic features [facility])    New Prescriptions New Prescriptions   No  medications on file   I personally performed the services described in this documentation, which was scribed in my presence. The recorded information has been reviewed and is accurate.        Delora Fuel, MD 03/75/43 [phone]

## 2016-07-27 NOTE — Progress Notes (Signed)
Nursing Shift note Pt has been anxious, worried about her medications. " I have severe pain and even with my medication 8 is good for me on a pain scale of 10". Pt refused groups ," I'm 66 if I don't know it by know I'm not going to." Encouraged pt to try but reported her pain was to bad earlier.Maintained on q 15 minute checks.

## 2016-07-27 NOTE — BHH Counselor (Signed)
Clinician spoke to [place] and expressed the pt was seen as a walk-in at [facility] and once medically cleared she can return to [facility]. Clinician provide the room/bed number and nursing report. Clinician noted from [place] she will follow up with the RN on information provided.    Edd Fabian, MS, [facility], [facility] Triage Specialist [phone]
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
## 2016-07-27 NOTE — BHH Group Notes (Signed)
[facility] LCSW Group Therapy  07/27/2016  1:05 PM  Type of Therapy:  Group therapy  Participation Level:  Active  Participation Quality:  Attentive  Affect:  Flat  Cognitive:  Oriented  Insight:  Limited  Engagement in Therapy:  Limited  Modes of Intervention:  Discussion, Socialization  Summary of Progress/Problems:  Chaplain was here to lead a group on themes of hope and courage.  "I just got back to a faith community.  I got away from it for awhile, and I realized how much I missed it.  I have been very involved with Bible study."  Trish Mage 07/27/2016 1:34 PM

## 2016-07-27 NOTE — Progress Notes (Addendum)
Recreation Therapy Notes  INPATIENT RECREATION THERAPY ASSESSMENT  Patient Details Name: ZAMANTHA STREBEL MRN: [id number] DOB: 1950-12-22 Today's Date: 07/27/2016  Patient Stressors: Other (Comment) (Medication change)  Pt stated she was here because she was taken off of her regular medication and put on Geodon.  Pt stated she became manic and then depressed. Pt stated she could feel her psychosis kicking in.  Coping Skills:   Talking  Personal Challenges: Other (Comment) (Pt did not identify any personal challenges)  Leisure Interests (2+):  Individual - Reading, Social - Family, Community - Geneticist, molecular, Individual - Other (Comment), Individual - Phone (Spend time with cat)  Awareness of Community Resources:  Yes  Community Resources:  [place]  Current Use: No  If no, Barriers?: Other (Comment) (Migraines)  Patient Strengths:  Self-confidence, like/care about people  Patient Identified Areas of Improvement:  "No idea"  Current Recreation Participation:  3 x a week  Patient Goal for Hospitalization:  "Get medications right"  [place] of Residence:  [place] of Residence:  [place]  Current [place] (including self-harm):  No  Current [place]:  No  Consent to Intern Participation: N/A   Victorino Sparrow, LRT/CTRS  Victorino Sparrow A 07/27/2016, 1:52 PM

## 2016-07-27 NOTE — Progress Notes (Signed)
Nursing Note Pt is anxious about her medications, unsure what they are . Left message with [facility] [phone] to verify medications and dosage.

## 2016-07-27 NOTE — Tx Team (Signed)
Initial Treatment Plan 07/27/2016 3:33 AM [name] FHQ:[id number]    PATIENT STRESSORS: Health problems Medication change or noncompliance   PATIENT STRENGTHS: Ability for insight Active sense of humor Average or above average intelligence Capable of independent living Communication skills General fund of knowledge Motivation for treatment/growth Religious Affiliation Special hobby/interest Supportive family/friends   PATIENT IDENTIFIED PROBLEMS: Depression   Anxiety   Psychosis   "Get back on medications that stabilize my condition"   "I hear voices telling me to hurt myself"   At risk for suicide            DISCHARGE CRITERIA:  Ability to meet basic life and health needs Improved stabilization in mood, thinking, and/or behavior Medical problems require only outpatient monitoring Motivation to continue treatment in a less acute level of care Need for constant or close observation no longer present Verbal commitment to aftercare and medication compliance  PRELIMINARY DISCHARGE PLAN: Attend aftercare/continuing care group Attend PHP/IOP Outpatient therapy Return to previous living arrangement  PATIENT/FAMILY INVOLVEMENT: This treatment plan has been presented to and reviewed with the patient, Meredith [name]. The patient have been given the opportunity to ask questions and make suggestions.  Meredith Skinner, RN 07/27/2016, 3:33 AM
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
## 2016-07-27 NOTE — Progress Notes (Signed)
Recreation Therapy Notes  Date: 07/27/16 Time: 1000 Location: 500 Hall Dayroom  Group Topic: Communication, Team Building, Problem Solving  Goal Area(s) Addresses:  Patient will effectively work with peer towards shared goal.  Patient will identify skills used to make activity successful.  Patient will identify how skills used during activity can be used to reach post d/c goals.   Intervention: STEM Activity  Activity: Geophysicist/field seismologist. In teams patients were given 12 plastic drinking straws and a length of masking tape. Using the materials provided patients were asked to build a landing pad to catch a golf ball dropped from approximately 6 feet in the air.   Education: Education officer, community, Discharge Planning   Education Outcome: Acknowledges education/In group clarification offered/Needs additional education.   Clinical Observations/Feedback: Pt did not attend group.   Victorino Sparrow, LRT/CTRS         Victorino Sparrow A 07/27/2016 12:06 PM

## 2016-07-27 NOTE — BH Assessment (Addendum)
Admission Note:   Meredith [name]  66 y.o. female who came to [facility] as a Walk-IN accompanied by her twin brother, Marquette Old. This is Pt's first admission to [facility]. Pt was calm and cooperative with admission process. Pt appears anxious in affect and mood. Speech was rapid and pressured. Thought process was coherent. Pt states she has a hx of Bipolar II and recent medication changes have left her "without enough medication." Pt states a few weeks ago her psychiatrist, Dr. Cherene Altes, took her off of Seroquel, Valium and Restoril. Pt states psychiatrist added Geodon. Pt states when she advised her psychiatrist she was becoming depressed, they increased her Geodon as of last Wednesday. Pt states that now her symptoms have progressed to [facility] beginning today with a command to kill herself. Pt endorses crying spells, sleep deprivation, and loss of appetite for 2  weeks. Pt denies SI/HI/VH. Rates pain 5/10; back. Pt sts she attempted suicide once about 30 years ago. Pt has PMH including A Fib, Scoliosis, Migraines and Hypothyroidism etc. and recent L. thumb surgery. Pt moved to [place] in May 2017. Pt's twin brother lives locally and provides main support. Pt states she is retired and is divorced after a long marriage. Pt. have 3 sons in their 56s who all live in [place]. Skin was assessed and found to be clear of any abnormal marks apart from rash under bilateral breasts. Pt searched and no contraband found, POC and unit policies explained and understanding verbalized. Consents obtained. Food and fluids offered, and fluids accepted. Provider on call notified and orders obtained. Pt had no additional questions or concerns.   Goal:   1. "Get back on medications that will stabilize my condition"

## 2016-07-27 NOTE — Progress Notes (Signed)
Adult Psychoeducational Group Note  Date:  07/27/2016 Time:  9:12 PM  Group Topic/Focus:  Wrap-Up Group:   The focus of this group is to help patients review their daily goal of treatment and discuss progress on daily workbooks.  Participation Level:  Did Not Attend  Participation Quality:  Did not attend  Affect:  Did not attend  Cognitive:  Did not attend  Insight: None  Engagement in Group:  Did not attend  Modes of Intervention:  Did not attend  Additional Comments:  Patient did not attend evening wrap up group.  [name] 07/27/2016, 9:12 PM

## 2016-07-27 NOTE — Progress Notes (Signed)
Patient ID: Meredith [name], female   DOB: [birth year], 66 y.o.   MRN: [id number] PER STATE REGULATIONS 482.30  THIS CHART WAS REVIEWED FOR MEDICAL NECESSITY WITH RESPECT TO THE PATIENT'S ADMISSION/DURATION OF STAY.  NEXT REVIEW DATE:07/31/16  Roma Schanz, RN, BSN CASE MANAGER

## 2016-07-28 DIAGNOSIS — F3164 Bipolar disorder, current episode mixed, severe, with psychotic features: Secondary | ICD-10-CM | POA: Diagnosis present

## 2016-07-28 DIAGNOSIS — G4700 Insomnia, unspecified: Secondary | ICD-10-CM

## 2016-07-28 LAB — PROLACTIN: Prolactin: 19.1 ng/mL (ref 4.8–23.3)

## 2016-07-28 LAB — HEMOGLOBIN A1C
Hgb A1c MFr Bld: 5.3 % (ref 4.8–5.6)
Mean Plasma Glucose: 105 mg/dL

## 2016-07-28 MED ORDER — ESZOPICLONE 2 MG PO TABS
1.0000 mg | ORAL_TABLET | Freq: Every day | ORAL | Status: DC
  Administered 2016-07-28 – 2016-07-30 (×3): 1 mg via ORAL
  Filled 2016-07-28 (×3): qty 1

## 2016-07-28 MED ORDER — SUMATRIPTAN SUCCINATE 25 MG PO TABS
25.0000 mg | ORAL_TABLET | Freq: Once | ORAL | Status: AC
  Administered 2016-07-28: 25 mg via ORAL
  Filled 2016-07-28 (×2): qty 1

## 2016-07-28 MED ORDER — NON FORMULARY: 1.0000 mg | Freq: Every day | Status: DC

## 2016-07-28 MED ORDER — TRAZODONE HCL 100 MG PO TABS: 100.0000 mg | ORAL_TABLET | Freq: Every evening | ORAL | Status: DC | PRN

## 2016-07-28 NOTE — BHH Group Notes (Signed)
[place] Group Notes: (Clinical Social Work)   07/28/2016      Type of Therapy:  Group Therapy   Participation Level:  Did Not Attend despite MHT prompting   Selmer Dominion, LCSW 07/28/2016, 12:49 PM

## 2016-07-28 NOTE — Progress Notes (Signed)
D: Pt is remained very hyper-verbal. Pt continually complained about generalized body pain; "every part of my body hurt; from my head to my feet."-Pt focused on her pain medication (hydrocodone). Pt endorsed moderate depression, anxiety irritability and worrying; "I don't know if my medications will ever be right." Pt isolative and withdrawn to room. Pt denied SI or HI.  A: Medications offered as prescribed. All patient's questions and concerns addressed. Support, encouragement, and safe environment provided. Will continue to monitor for any changes. 15-minute safety checks continue. R: Pt was med compliant.  Pt did attended wrap-up group. Safety checks continue.

## 2016-07-28 NOTE — Progress Notes (Signed)
Adult Psychoeducational Group Note  Date:  07/28/2016 Time:  9:44 PM  Group Topic/Focus:  Wrap-Up Group:   The focus of this group is to help patients review their daily goal of treatment and discuss progress on daily workbooks.  Participation Level:  Active  Participation Quality:  Appropriate  Affect:  Appropriate  Cognitive:  Alert and Appropriate  Insight: Appropriate and Good  Engagement in Group:  Engaged  Modes of Intervention:  Education  Additional Comments:  Patient stated her goal was get out of bed and interact more with peers. Patient stated she had a better day today. Patient rated her day 8 out of 10 today. Patient stated that something positive that happen today was she played card games with peers. Patient stated her goal for tomorrow was to work on discharge.  [name] 07/28/2016, 9:44 PM

## 2016-07-28 NOTE — Progress Notes (Signed)
Nursing Progress Note: 7p-7a D: Pt currently presents with a pleasant/anxious affect and behavior. Pt states "I feel in higher spirits today. My pain has got me down though. I hear voices too, but not as bad as yesterday." Interacting appropriately with milieu. Pt reports fair sleep with current medication regimen.   A: Pt provided with medications per providers orders. Pt's labs and vitals were monitored throughout the night. Pt supported emotionally and encouraged to express concerns and questions. Pt educated on medications.  R: Pt's safety ensured with 15 minute and environmental checks. Pt currently endorses AH and denies SI/HI/Self Harm and VH. Pt verbally contracts to seek staff if SI/HI or A/VH occurs and to consult with staff before acting on any harmful thoughts. Will continue to monitor.

## 2016-07-28 NOTE — Progress Notes (Signed)
[facility] MD Progress Note  07/28/2016 2:39 PM Meredith [name]  MRN:  [id number] Subjective:  Patient states " I am not sleeping.'  Objective:Patient seen and chart reviewed.Discussed patient with treatment team.  Pt seen in bed, continues to be focussed on her pain, she also has sleep issues . Her sleep issues are also contributed to by her pain sx. Pt reports she also has a migraine headache - would like to try something for it. Discussed imitrex, she agrees. She has AH - but states they are quieter. Per RN - continues to need a lot of support, compliant on medications.   Principal Problem: Bipolar disorder, curr episode mixed, severe, with psychotic features ([place]) Diagnosis:   Patient Active Problem List   Diagnosis Date Noted  . Bipolar disorder, curr episode mixed, severe, with psychotic features ([place]) [F31.64] 07/28/2016  . Atrial fibrillation ([place]) [I48.91] 11/14/2015   Total Time spent with patient: 25 minutes  Past Psychiatric History: Please see H&P.   Past Medical History:  Past Medical History:  Diagnosis Date  . Anxiety   . Atrial fibrillation ([place])   . Bipolar 2 disorder ([place])   . Depression   . History of cardioversion    x2  . Hyperlipidemia   . Hypothyroidism   . Migraine headache   . Osteoporosis     Past Surgical History:  Procedure Laterality Date  . ABLATION    . BACK SURGERY  2014  . CARDIOVERSION  12/2013, 01/2014   x2  . KNEE SURGERY     ORTHOSCOPIC  . LUNG REMOVAL, PARTIAL Right    BENIGN LUNG CYST  . THORACIC SPINE SURGERY Right   . THUMB ARTHROSCOPY     Family History:  Family History  Problem Relation Age of Onset  . Atrial fibrillation Brother    Family Psychiatric  History: Please see H&P.  Social History:  History  Alcohol Use No     History  Drug Use No    Social History   Social History  . Marital status: Single    Spouse name: N/A  . Number of children: N/A  . Years of education: N/A   Social History Main Topics  .  Smoking status: Never Smoker  . Smokeless tobacco: Never Used  . Alcohol use No  . Drug use: No  . Sexual activity: Not Asked   Other Topics Concern  . None   Social History Narrative  . None   Additional Social History:                         Sleep: Poor  Appetite:  Fair  Current Medications: Current Facility-Administered Medications  Medication Dose Route Frequency Provider Last Rate Last Dose  . acetaminophen (TYLENOL) tablet 650 mg  650 mg Oral Q6H PRN Rozetta Nunnery, NP      . alum & mag hydroxide-simeth (MAALOX/MYLANTA) 200-200-20 MG/5ML suspension 30 mL  30 mL Oral Q4H PRN Lindon Romp A, NP      . calcium carbonate (OS-CAL - dosed in mg of elemental calcium) tablet 500 mg of elemental calcium  1 tablet Oral Daily Nwoko, Agnes I, NP   500 mg of elemental calcium at 07/28/16 0813  . clotrimazole (LOTRIMIN) 1 % cream   Topical BID PRN Lindon Romp A, NP      . estradiol (ESTRACE) tablet 1.5 mg  1.5 mg Oral Daily Nwoko, Agnes I, NP   1.5 mg at 07/28/16 0813  .  ferrous sulfate tablet 325 mg  325 mg Oral Daily Lindell Spar I, NP   325 mg at 07/28/16 0814  . HYDROcodone-acetaminophen (NORCO/VICODIN) 5-325 MG per tablet 1.5 tablet  1.5 tablet Oral BID PRN Lindell Spar I, NP   1.5 tablet at 07/28/16 0819  . hydrOXYzine (ATARAX/VISTARIL) tablet 50 mg  50 mg Oral TID PRN Hampton Abbot, MD   50 mg at 07/28/16 1314  . levothyroxine (SYNTHROID, LEVOTHROID) tablet 112 mcg  112 mcg Oral QAC breakfast Lindon Romp A, NP   112 mcg at 07/28/16 [phone]  . magnesium hydroxide (MILK OF MAGNESIA) suspension 30 mL  30 mL Oral Daily PRN Lindon Romp A, NP      . NON FORMULARY 1 mg  1 mg Oral QHS Eappen, Saramma, MD      . ondansetron (ZOFRAN) tablet 4 mg  4 mg Oral TID PRN Lindon Romp A, NP      . pantoprazole (PROTONIX) EC tablet 40 mg  40 mg Oral Daily Lindon Romp A, NP   40 mg at 07/28/16 0813  . pregabalin (LYRICA) capsule 150 mg  150 mg Oral TID Lindell Spar I, NP   150 mg at  07/28/16 1120  . QUEtiapine (SEROQUEL XR) 24 hr tablet 200 mg  200 mg Oral QHS Hampton Abbot, MD   200 mg at 07/27/16 2149  . senna-docusate (Senokot-S) tablet 1 tablet  1 tablet Oral BID Ursula Alert, MD   1 tablet at 07/28/16 0814  . traZODone (DESYREL) tablet 100 mg  100 mg Oral QHS PRN Ursula Alert, MD      . verapamil (CALAN-SR) CR tablet 120 mg  120 mg Oral QHS Lindon Romp A, NP   120 mg at 07/27/16 2150    Lab Results:  Results for orders placed or performed during the hospital encounter of 07/27/16 (from the past 48 hour(s))  Hemoglobin A1c     Status: None   Collection Time: 07/27/16  6:03 AM  Result Value Ref Range   Hgb A1c MFr Bld 5.3 4.8 - 5.6 %    Comment: (NOTE)         Pre-diabetes: 5.7 - 6.4         Diabetes: >6.4         Glycemic control for adults with diabetes: <7.0    Mean Plasma Glucose 105 mg/dL    Comment: (NOTE) Performed At: [facility] [place], [place] [id number] Lindon Romp MD [id number] Performed at [facility], [place] [address] [place], [place] [address]   Lipid panel     Status: Abnormal   Collection Time: 07/27/16  6:03 AM  Result Value Ref Range   Cholesterol 224 (H) 0 - 200 mg/dL   Triglycerides 92 <150 mg/dL   HDL 118 >40 mg/dL   Total CHOL/HDL Ratio 1.9 RATIO   VLDL 18 0 - 40 mg/dL   LDL Cholesterol 88 0 - 99 mg/dL    Comment:        Total Cholesterol/HDL:CHD Risk Coronary Heart Disease Risk Table                     Men   Women  1/2 Average Risk   3.4   3.3  Average Risk       5.0   4.4  2 X Average Risk   9.6   7.1  3 X Average Risk  23.4   11.0  Use the calculated Patient Ratio above and the CHD Risk Table to determine the patient's CHD Risk.        ATP III CLASSIFICATION (LDL):  <100     mg/dL   Optimal  100-129  mg/dL   Near or Above                    Optimal  130-159  mg/dL   Borderline  160-189  mg/dL   High  >190     mg/dL   Very High Performed at [place] [address] [place], [place] [address]   TSH     Status: Abnormal   Collection Time: 07/27/16  6:03 AM  Result Value Ref Range   TSH 5.320 (H) 0.350 - 4.500 uIU/mL    Comment: Performed by a 3rd Generation assay with a functional sensitivity of <=0.01 uIU/mL. Performed at [facility], [place] [address] [place], [place] [address]   Prolactin     Status: None   Collection Time: 07/27/16  6:03 AM  Result Value Ref Range   Prolactin 19.1 4.8 - 23.3 ng/mL    Comment: (NOTE) Performed At: [facility] [place], [place] [id number] Lindon Romp MD [id number] Performed at [facility], [place] [address] [place], [place] [address]     Blood Alcohol level:  Lab Results  Component Value Date   ETH <5 62/83/1517    Metabolic Disorder Labs: Lab Results  Component Value Date   HGBA1C 5.3 07/27/2016   MPG 105 07/27/2016   Lab Results  Component Value Date   PROLACTIN 19.1 07/27/2016   Lab Results  Component Value Date   CHOL 224 (H) 07/27/2016   TRIG 92 07/27/2016   HDL 118 07/27/2016   CHOLHDL 1.9 07/27/2016   VLDL 18 07/27/2016   LDLCALC 88 07/27/2016    Physical Findings: AIMS:  , ,  ,  ,    CIWA:    COWS:     Musculoskeletal: Strength & Muscle Tone: within normal limits Gait & Station: normal Patient leans: N/A  Psychiatric Specialty Exam: Physical Exam  Nursing note and vitals reviewed.   Review of Systems  Psychiatric/Behavioral: Positive for depression and hallucinations. The patient is nervous/anxious and has insomnia.   All other systems reviewed and are negative.   Blood pressure (!) 116/96, pulse 100, temperature 97.4 F (36.3 C), temperature source Oral, resp. rate 20, height 4\' 10"  (1.473 m), weight 79.8 kg (176 lb), SpO2 98 %.Body mass index is 36.78 kg/m.  General Appearance: Guarded  Eye Contact:  Minimal  Speech:  Normal Rate  Volume:  Decreased  Mood:  Angry,  Anxious and Depressed  Affect:  Depressed  Thought Process:  Goal Directed and Descriptions of Associations: Circumstantial  Orientation:  Other:  alert  Thought Content:  Hallucinations: Auditory and Rumination  Suicidal Thoughts:  No  Homicidal Thoughts:  No  Memory:  Immediate;   Fair Recent;   Fair Remote;   Fair  Judgement:  Fair  Insight:  Shallow  Psychomotor Activity:  Restlessness  Concentration:  Concentration: Poor and Attention Span: Poor  Recall:  [employer] of Knowledge:  Fair  Language:  Fair  Akathisia:  No  Handed:  Right  AIMS (if indicated):     Assets:  Desire for Improvement  ADL's:  Intact  Cognition:  WNL  Sleep:  Number of Hours: 6.25     Treatment Plan Summary:Patient  with worsening psychosis, sleep issues , mood sx, after he medications were changed by her outpatient provider, will continue to readjust medications.  Daily contact with patient to assess and evaluate symptoms and progress in treatment, Medication management and Plan see below  Continue Seroquel xr 200 mg po qhs for psychosis/mood sx. Add Lunesta 1 mg po qhs for insomnia. Make Trazodone 100 mg po qhs prn since she is not sure if she wants to take it. Give Imitrex 25 mg po prn x1 dose for migraines. Make use of other pain medications for pain issues. Reviewed [place] controlled substance database . Reviewed labs - tsh - elevated , will order t3, t4. CSW will continue to work on disposition.   Eappen,Saramma, MD 07/28/2016, 2:39 PM

## 2016-07-29 LAB — T4, FREE: Free T4: 0.95 ng/dL (ref 0.61–1.12)

## 2016-07-29 MED ORDER — SUMATRIPTAN SUCCINATE 25 MG PO TABS
25.0000 mg | ORAL_TABLET | Freq: Once | ORAL | Status: AC
  Administered 2016-07-29: 25 mg via ORAL
  Filled 2016-07-29 (×2): qty 1

## 2016-07-29 NOTE — BHH Group Notes (Signed)
[place] Group Notes:  (Clinical Social Work)  07/29/2016  11:00AM-12:00PM  Summary of Progress/Problems:  The main focus of today's process group was to listen to a variety of genres of music and to identify that different types of music provoke different responses.  The patient then was able to identify personally what was soothing for them, as well as energizing, as well as how patient can personally use this knowledge in sleep habits, with depression, and with other symptoms.  The patient expressed at the beginning of group the overall feeling of "Good" and appeared to smile, dance, sing along for the most part to the music.  At the end of group she stated that music is a tool she uses at home and misses here.  She stated it makes her feel better, and she did feel better after an hour of music.  Type of Therapy:  Music Therapy   Participation Level:  Active  Participation Quality:  Attentive and Sharing  Affect:  Appropriate  Cognitive:  Oriented  Insight:  Engaged  Engagement in Therapy:  Engaged  Modes of Intervention:   Activity, Exploration  Selmer Dominion, LCSW 07/29/2016

## 2016-07-29 NOTE — Progress Notes (Signed)
Nursing Progress Note: 7p-7a D: Pt currently presents with a pleasant/anxious affect and behavior. Pt states "I am still hearing voices but it is nothing negative." Interacting appropriately with milieu. Pt reports fair sleep with current medication regimen.   A: Pt provided with medications per providers orders. Pt's labs and vitals were monitored throughout the night. Pt supported emotionally and encouraged to express concerns and questions. Pt educated on medications.  R: Pt's safety ensured with 15 minute and environmental checks. Pt currently endorses AH and denies SI/HI/Self Harm and VH. Pt verbally contracts to seek staff if SI/HI or A/VH occurs and to consult with staff before acting on any harmful thoughts. Will continue to monitor.

## 2016-07-29 NOTE — Progress Notes (Signed)
[facility] MD Progress Note  07/29/2016 2:00 PM Meredith [name]  MRN:  [id number] Subjective:  Pt states " I still have these headaches. I did sleep well last night."   Objective:Patient seen and chart reviewed.Discussed patient with treatment team.  Pt reports improvement of AH , states she is less distressed by it. She reports sleep as improved on lunesta. She is still having some headaches , has a chronic hx of migraine . Per RN , continues to be somatic, continues to need support.   Principal Problem: Bipolar disorder, curr episode mixed, severe, with psychotic features ([place]) Diagnosis:   Patient Active Problem List   Diagnosis Date Noted  . Bipolar disorder, curr episode mixed, severe, with psychotic features ([place]) [F31.64] 07/28/2016  . Atrial fibrillation ([place]) [I48.91] 11/14/2015   Total Time spent with patient: 20 minutes  Past Psychiatric History: Please see H&P.   Past Medical History:  Past Medical History:  Diagnosis Date  . Anxiety   . Atrial fibrillation ([place])   . Bipolar 2 disorder ([place])   . Depression   . History of cardioversion    x2  . Hyperlipidemia   . Hypothyroidism   . Migraine headache   . Osteoporosis     Past Surgical History:  Procedure Laterality Date  . ABLATION    . BACK SURGERY  2014  . CARDIOVERSION  12/2013, 01/2014   x2  . KNEE SURGERY     ORTHOSCOPIC  . LUNG REMOVAL, PARTIAL Right    BENIGN LUNG CYST  . THORACIC SPINE SURGERY Right   . THUMB ARTHROSCOPY     Family History:  Family History  Problem Relation Age of Onset  . Atrial fibrillation Brother    Family Psychiatric  History: Please see H&P.  Social History:  History  Alcohol Use No     History  Drug Use No    Social History   Social History  . Marital status: Single    Spouse name: N/A  . Number of children: N/A  . Years of education: N/A   Social History Main Topics  . Smoking status: Never Smoker  . Smokeless tobacco: Never Used  . Alcohol use No  .  Drug use: No  . Sexual activity: Not Asked   Other Topics Concern  . None   Social History Narrative  . None   Additional Social History:                         Sleep: Fair  Appetite:  Fair  Current Medications: Current Facility-Administered Medications  Medication Dose Route Frequency Provider Last Rate Last Dose  . acetaminophen (TYLENOL) tablet 650 mg  650 mg Oral Q6H PRN Rozetta Nunnery, NP      . alum & mag hydroxide-simeth (MAALOX/MYLANTA) 200-200-20 MG/5ML suspension 30 mL  30 mL Oral Q4H PRN Lindon Romp A, NP      . calcium carbonate (OS-CAL - dosed in mg of elemental calcium) tablet 500 mg of elemental calcium  1 tablet Oral Daily Nwoko, Agnes I, NP   500 mg of elemental calcium at 07/29/16 0733  . clotrimazole (LOTRIMIN) 1 % cream   Topical BID PRN Lindon Romp A, NP      . estradiol (ESTRACE) tablet 1.5 mg  1.5 mg Oral Daily Nwoko, Agnes I, NP   1.5 mg at 07/29/16 0733  . eszopiclone (LUNESTA) tablet 1 mg  1 mg Oral QHS Eappen, Saramma, MD   1 mg  at 07/28/16 2112  . ferrous sulfate tablet 325 mg  325 mg Oral Daily Lindell Spar I, NP   325 mg at 07/29/16 0734  . HYDROcodone-acetaminophen (NORCO/VICODIN) 5-325 MG per tablet 1.5 tablet  1.5 tablet Oral BID PRN Lindell Spar I, NP   1.5 tablet at 07/29/16 1017  . hydrOXYzine (ATARAX/VISTARIL) tablet 50 mg  50 mg Oral TID PRN Hampton Abbot, MD   50 mg at 07/28/16 2112  . levothyroxine (SYNTHROID, LEVOTHROID) tablet 112 mcg  112 mcg Oral QAC breakfast Lindon Romp A, NP   112 mcg at 07/29/16 2952  . magnesium hydroxide (MILK OF MAGNESIA) suspension 30 mL  30 mL Oral Daily PRN Lindon Romp A, NP      . ondansetron (ZOFRAN) tablet 4 mg  4 mg Oral TID PRN Lindon Romp A, NP      . pantoprazole (PROTONIX) EC tablet 40 mg  40 mg Oral Daily Lindon Romp A, NP   40 mg at 07/29/16 0733  . pregabalin (LYRICA) capsule 150 mg  150 mg Oral TID Lindell Spar I, NP   150 mg at 07/29/16 1208  . QUEtiapine (SEROQUEL XR) 24 hr tablet  200 mg  200 mg Oral QHS Hampton Abbot, MD   200 mg at 07/28/16 2112  . senna-docusate (Senokot-S) tablet 1 tablet  1 tablet Oral BID Ursula Alert, MD   1 tablet at 07/29/16 0733  . SUMAtriptan (IMITREX) tablet 25 mg  25 mg Oral Once Ursula Alert, MD      . traZODone (DESYREL) tablet 100 mg  100 mg Oral QHS PRN Eappen, Saramma, MD      . verapamil (CALAN-SR) CR tablet 120 mg  120 mg Oral QHS Lindon Romp A, NP   120 mg at 07/28/16 2112    Lab Results:  Results for orders placed or performed during the hospital encounter of 07/27/16 (from the past 48 hour(s))  T4, free     Status: None   Collection Time: 07/28/16  6:24 PM  Result Value Ref Range   Free T4 0.95 0.61 - 1.12 ng/dL    Comment: (NOTE) Biotin ingestion may interfere with free T4 tests. If the results are inconsistent with the TSH level, previous test results, or the clinical presentation, then consider biotin interference. If needed, order repeat testing after stopping biotin. Performed at [facility], [place] [address] [place], [place] [address]     Blood Alcohol level:  Lab Results  Component Value Date   ETH <5 44/02/270    Metabolic Disorder Labs: Lab Results  Component Value Date   HGBA1C 5.3 07/27/2016   MPG 105 07/27/2016   Lab Results  Component Value Date   PROLACTIN 19.1 07/27/2016   Lab Results  Component Value Date   CHOL 224 (H) 07/27/2016   TRIG 92 07/27/2016   HDL 118 07/27/2016   CHOLHDL 1.9 07/27/2016   VLDL 18 07/27/2016   LDLCALC 88 07/27/2016    Physical Findings: AIMS:  , ,  ,  ,    CIWA:    COWS:     Musculoskeletal: Strength & Muscle Tone: within normal limits Gait & Station: normal Patient leans: N/A  Psychiatric Specialty Exam: Physical Exam  Nursing note and vitals reviewed.   Review of Systems  Psychiatric/Behavioral: Positive for depression.  All other systems reviewed and are negative.   Blood pressure (!) 116/96, pulse 100, temperature 97.4 F (36.3  C), temperature source Oral, resp. rate 20, height 4\' 10"  (1.473 m),  weight 79.8 kg (176 lb), SpO2 98 %.Body mass index is 36.78 kg/m.  General Appearance: Fairly Groomed  Eye Contact:  Fair  Speech:  Normal Rate  Volume:  Normal  Mood:  Depressed, improving  Affect:  Congruent  Thought Process:  Goal Directed and Descriptions of Associations: Circumstantial  Orientation:  Other:  oriented to self, place  Thought Content:  Rumination, AH - improved  Suicidal Thoughts:  No  Homicidal Thoughts:  No  Memory:  Immediate;   Fair Recent;   Fair Remote;   Fair  Judgement:  Fair  Insight:  Shallow  Psychomotor Activity:  Normal  Concentration:  Concentration: Fair and Attention Span: Fair  Recall:  [employer] of Knowledge:  Fair  Language:  Fair  Akathisia:  No  Handed:  Right  AIMS (if indicated):     Assets:  Desire for Improvement  ADL's:  Intact  Cognition:  WNL  Sleep:  Number of Hours: 6.5   Bipolar disorder, curr episode mixed, severe, with psychotic features ([place]) improving  Will continue today 07/29/16 plan as below except where it is noted.    Treatment Plan Summary:Patient with improvement of AH as well as sleep last night , continues to treat.  Daily contact with patient to assess and evaluate symptoms and progress in treatment, Medication management and Plan see below  Continue Seroquel xr 200 mg po qhs for psychosis/mood sx. Continue Lunesta 1 mg po qhs for insomnia. Make Trazodone 100 mg po qhs prn since she is not sure if she wants to take it. Give Imitrex 25 mg po prn x1 dose for migraines. Make use of other pain medications for pain issues. Reviewed [place] controlled substance database . Reviewed labs - tsh - elevated , reviewed t4- wnl. CSW will continue to work on disposition.   Eappen,Saramma, MD 07/29/2016, 2:00 PM

## 2016-07-29 NOTE — Progress Notes (Signed)
Patient denies SI, HI and AVH, but reported feeling sedated and "woozy" this morning.   Patient has been in her room resting and has had no incidents of behavioral dsycontrol this shift.     Assess patient for safety, offer medications as prescribed, engage patient in 1:1 staff talks.   Continue to monitor as planned.

## 2016-07-30 DIAGNOSIS — G894 Chronic pain syndrome: Secondary | ICD-10-CM | POA: Diagnosis present

## 2016-07-30 LAB — T3: T3, Total: 129 ng/dL (ref 71–180)

## 2016-07-30 NOTE — Tx Team (Signed)
Interdisciplinary Treatment and Diagnostic Plan Update  07/30/2016 Time of Session: 3:14 PM  Meredith [name] MRN: [id number]  Principal Diagnosis: Bipolar disorder, curr episode mixed, severe, with psychotic features ([place])  Secondary Diagnoses: Principal Problem:   Bipolar disorder, curr episode mixed, severe, with psychotic features ([place]) Active Problems:   Chronic pain syndrome   Current Medications:  Current Facility-Administered Medications  Medication Dose Route Frequency Provider Last Rate Last Dose  . acetaminophen (TYLENOL) tablet 650 mg  650 mg Oral Q6H PRN Rozetta Nunnery, NP      . alum & mag hydroxide-simeth (MAALOX/MYLANTA) 200-200-20 MG/5ML suspension 30 mL  30 mL Oral Q4H PRN Lindon Romp A, NP      . calcium carbonate (OS-CAL - dosed in mg of elemental calcium) tablet 500 mg of elemental calcium  1 tablet Oral Daily Nwoko, Agnes I, NP   500 mg of elemental calcium at 07/30/16 0735  . clotrimazole (LOTRIMIN) 1 % cream   Topical BID PRN Lindon Romp A, NP      . estradiol (ESTRACE) tablet 1.5 mg  1.5 mg Oral Daily Nwoko, Agnes I, NP   1.5 mg at 07/30/16 0734  . eszopiclone (LUNESTA) tablet 1 mg  1 mg Oral QHS Eappen, Ria Clock, MD   1 mg at 07/29/16 2128  . ferrous sulfate tablet 325 mg  325 mg Oral Daily Lindell Spar I, NP   325 mg at 07/30/16 0737  . HYDROcodone-acetaminophen (NORCO/VICODIN) 5-325 MG per tablet 1.5 tablet  1.5 tablet Oral BID PRN Lindell Spar I, NP   1.5 tablet at 07/30/16 0733  . hydrOXYzine (ATARAX/VISTARIL) tablet 50 mg  50 mg Oral TID PRN Hampton Abbot, MD   50 mg at 07/28/16 2112  . levothyroxine (SYNTHROID, LEVOTHROID) tablet 112 mcg  112 mcg Oral QAC breakfast Lindon Romp A, NP   112 mcg at 07/30/16 3762  . magnesium hydroxide (MILK OF MAGNESIA) suspension 30 mL  30 mL Oral Daily PRN Lindon Romp A, NP      . ondansetron (ZOFRAN) tablet 4 mg  4 mg Oral TID PRN Lindon Romp A, NP      . pantoprazole (PROTONIX) EC tablet 40 mg  40 mg Oral Daily Lindon Romp A, NP   40 mg at 07/30/16 0736  . pregabalin (LYRICA) capsule 150 mg  150 mg Oral TID Lindell Spar I, NP   150 mg at 07/30/16 1141  . QUEtiapine (SEROQUEL XR) 24 hr tablet 200 mg  200 mg Oral QHS Hampton Abbot, MD   200 mg at 07/29/16 2128  . senna-docusate (Senokot-S) tablet 1 tablet  1 tablet Oral BID Ursula Alert, MD   1 tablet at 07/30/16 0736  . verapamil (CALAN-SR) CR tablet 120 mg  120 mg Oral QHS Lindon Romp A, NP   120 mg at 07/29/16 2128    PTA Medications: Prescriptions Prior to Admission  Medication Sig Dispense Refill Last Dose  . calcium carbonate (OS-CAL - DOSED IN MG OF ELEMENTAL CALCIUM) 1250 (500 Ca) MG tablet Take 1 tablet by mouth.   Past Week at Unknown time  . cholecalciferol (VITAMIN D) 1000 units tablet Take 1,000 Units by mouth daily.   Past Week at Unknown time  . clotrimazole (LOTRIMIN) 1 % cream Apply 1 application topically 2 (two) times daily. under brest   Past Week at Unknown time  . diphenhydrAMINE (BENADRYL) 50 MG capsule Take 50 mg by mouth every 6 (six) hours as needed for allergies.   Past Week at  Unknown time  . esomeprazole (NEXIUM) 40 MG capsule Take 40 mg by mouth daily as needed.    Past Week at Unknown time  . estradiol (ESTRACE) 1 MG tablet Take 1.5 mg by mouth daily.    Past Week at Unknown time  . ferrous sulfate 325 (65 FE) MG tablet Take 325 mg by mouth daily with breakfast.   Past Week at Unknown time  . HYDROcodone-acetaminophen (NORCO) 7.5-325 MG tablet Take 1 tablet by mouth 2 (two) times daily as needed for moderate pain.    Past Week at Unknown time  . levothyroxine (SYNTHROID, LEVOTHROID) 112 MCG tablet Take 112 mcg by mouth daily before breakfast.   Past Week at Unknown time  . ondansetron (ZOFRAN) 4 MG tablet Take 4 mg by mouth 3 (three) times daily as needed. As needed for nausea   Past Week at Unknown time  . pregabalin (LYRICA) 150 MG capsule Take 150 mg by mouth 3 (three) times daily.   Past Week at Unknown time  .  senna-docusate (PERI-COLACE) 8.6-50 MG tablet Take 2 tablets by mouth 2 (two) times daily.   Past Week at Unknown time  . venlafaxine XR (EFFEXOR-XR) 150 MG 24 hr capsule Take 150 mg by mouth daily with breakfast.   Past Week at Unknown time  . verapamil (VERELAN PM) 120 MG 24 hr capsule Take 120 mg by mouth at bedtime.    Past Week at Unknown time  . metoprolol succinate (TOPROL-XL) 50 MG 24 hr tablet Take 1 tablet (50 mg total) by mouth daily. Take with or immediately following a meal. 90 tablet 3     Treatment Modalities: Medication Management, Group therapy, Case management,  1 to 1 session with clinician, Psychoeducation, Recreational therapy.   Physician Treatment Plan for Primary Diagnosis: Bipolar disorder, curr episode mixed, severe, with psychotic features ([place]) Long Term Goal(s): Improvement in symptoms so as ready for discharge  Short Term Goals: Ability to demonstrate self-control will improve Ability to identify and develop effective coping behaviors will improve Ability to maintain clinical measurements within normal limits will improve Ability to verbalize feelings will improve Ability to demonstrate self-control will improve Ability to identify and develop effective coping behaviors will improve Ability to maintain clinical measurements within normal limits will improve  Medication Management: Evaluate patient's response, side effects, and tolerance of medication regimen.  Therapeutic Interventions: 1 to 1 sessions, Unit Group sessions and Medication administration.  Evaluation of Outcomes: Adequate for Discharge  Physician Treatment Plan for Secondary Diagnosis: Principal Problem:   Bipolar disorder, curr episode mixed, severe, with psychotic features ([place]) Active Problems:   Chronic pain syndrome   Long Term Goal(s): Improvement in symptoms so as ready for discharge  Short Term Goals: Ability to demonstrate self-control will improve Ability to identify and  develop effective coping behaviors will improve Ability to maintain clinical measurements within normal limits will improve Ability to verbalize feelings will improve Ability to demonstrate self-control will improve Ability to identify and develop effective coping behaviors will improve Ability to maintain clinical measurements within normal limits will improve  Medication Management: Evaluate patient's response, side effects, and tolerance of medication regimen.  Therapeutic Interventions: 1 to 1 sessions, Unit Group sessions and Medication administration.  Evaluation of Outcomes: Adequate for Discharge   RN Treatment Plan for Primary Diagnosis: Bipolar disorder, curr episode mixed, severe, with psychotic features ([place]) Long Term Goal(s): Knowledge of disease and therapeutic regimen to maintain health will improve  Short Term Goals: Ability to identify and  develop effective coping behaviors will improve and Compliance with prescribed medications will improve  Medication Management: RN will administer medications as ordered by provider, will assess and evaluate patient's response and provide education to patient for prescribed medication. RN will report any adverse and/or side effects to prescribing provider.  Therapeutic Interventions: 1 on 1 counseling sessions, Psychoeducation, Medication administration, Evaluate responses to treatment, Monitor vital signs and CBGs as ordered, Perform/monitor CIWA, COWS, AIMS and Fall Risk screenings as ordered, Perform wound care treatments as ordered.  Evaluation of Outcomes: Adequate for Discharge   LCSW Treatment Plan for Primary Diagnosis: Bipolar disorder, curr episode mixed, severe, with psychotic features ([place]) Long Term Goal(s): Safe transition to appropriate next level of care at discharge, Engage patient in therapeutic group addressing interpersonal concerns.  Short Term Goals: Engage patient in aftercare planning with referrals and  resources  Therapeutic Interventions: Assess for all discharge needs, 1 to 1 time with Social worker, Explore available resources and support systems, Assess for adequacy in community support network, Educate family and significant other(s) on suicide prevention, Complete Psychosocial Assessment, Interpersonal group therapy.  Evaluation of Outcomes: Met  Return home, follow up outpt   Progress in Treatment: Attending groups: Yes Participating in groups: Yes Taking medication as prescribed: Yes Toleration medication: Yes, no side effects reported at this time Family/Significant other contact made: No Patient understands diagnosis: Yes AEB asking for a "medication adjustment, nothing more." Discussing patient identified problems/goals with staff: Yes Medical problems stabilized or resolved: Yes Denies suicidal/homicidal ideation: Yes Issues/concerns per patient self-inventory: None Other: N/A  New problem(s) identified: None identified at this time.   New Short Term/Long Term Goal(s): None identified at this time.   Discharge Plan or Barriers:   Reason for Continuation of Hospitalization:       Estimated Length of Stay: [place] d/c tomorrow  Attendees: Patient: 07/30/2016  3:14 PM  Physician: Ursula Alert, MD 07/30/2016  3:14 PM  Nursing: Grayland Ormond, RN 07/30/2016  3:14 PM  RN Care Manager: Lars Pinks, RN 07/30/2016  3:14 PM  Social Worker: Ripley Fraise 07/30/2016  3:14 PM  Recreational Therapist: Laretta Bolster  07/30/2016  3:14 PM  Other: Norberto Sorenson 07/30/2016  3:14 PM  Other:  07/30/2016  3:14 PM    Scribe for Treatment Team:  Roque Lias LCSW 07/30/2016 3:14 PM

## 2016-07-30 NOTE — Progress Notes (Signed)
Recreation Therapy Notes  Date: 07/30/16 Time: 1000 Location: [address] Dayroom  Group Topic:  Goal Setting  Goal Area(s) Addresses:  Patient will be able to identify at least 3 life goals.  Patient will be able to identify benefit of investing in goals.  Patient will be able to identify benefit of setting life goals.   Behavioral Response:  Engaged  Intervention: Worksheets, pencils  Activity: Life Goals.  Patients were given a worksheet with six categories (family, friends, work/school, spirituality, body and mental health).  Pt were to say what they were doing well, where they needed to improve and write a goal for how they would make the improvement.  Education:  Discharge Planning, Coping Skills, Life Goals  Education Outcome: Acknowledges Education/In Group Clarification Provided/Needs Additional Education  Clinical Observations:  Pt was very engaged with group.  Pt expressed her top categories were family, spirituality and body.  Pt expressed for spirituality she does well at going to church but didn't know where she needed to improve and has a goal to continue doing what she is doing.  For body, pt stated she does well at taking care of herself, needs to get more active and has a goal of being proactive mentally and physically.  Lastly, pt shared that she does well at staying in touch with her youngest son and talking with her twin brother.  Pt needs to improve not sitting around and doing nothing and her goal was to stay on a spiritual path.   Victorino Sparrow, LRT/CTRS      Victorino Sparrow A 07/30/2016 12:21 PM

## 2016-07-30 NOTE — BHH Suicide Risk Assessment (Signed)
[facility] Discharge Suicide Risk Assessment   Principal Problem: Bipolar disorder, curr episode mixed, severe, with psychotic features [facility]) Discharge Diagnoses:  Patient Active Problem List   Diagnosis Date Noted  . Chronic pain syndrome [G89.4] 07/30/2016  . Bipolar disorder, curr episode mixed, severe, with psychotic features ([place]) [F31.64] 07/28/2016  . Atrial fibrillation ([place]) [I48.91] 11/14/2015    Total Time spent with patient: 30 minutes  Musculoskeletal: Strength & Muscle Tone: within normal limits Gait & Station: normal Patient leans: N/A  Psychiatric Specialty Exam: Review of Systems  Psychiatric/Behavioral: Positive for hallucinations (improved , positive). Negative for depression. The patient is not nervous/anxious.   All other systems reviewed and are negative.   Blood pressure (!) 104/58, pulse (!) 126, temperature 98.5 F (36.9 C), temperature source Oral, resp. rate 18, height 4\' 10"  (1.473 m), weight 79.8 kg (176 lb), SpO2 98 %.Body mass index is 36.78 kg/m.  General Appearance: Casual  Eye Contact::  Fair  Speech:  Clear and Coherent409  Volume:  Normal  Mood:  Anxious, improved  Affect:  Appropriate  Thought Process:  Goal Directed and Descriptions of Associations: Intact  Orientation:  Other:  self, situation  Thought Content:  Hallucinations: Auditory, improved  Suicidal Thoughts:  No  Homicidal Thoughts:  No  Memory:  Immediate;   Fair Recent;   Fair Remote;   Fair  Judgement:  Fair  Insight:  Fair  Psychomotor Activity:  Normal  Concentration:  Fair  Recall:  [employer] of Knowledge:Fair  Language: Fair  Akathisia:  No  Handed:  Right  AIMS (if indicated):     Assets:  Communication Skills Desire for Improvement  Sleep:  Number of Hours: 5.5  Cognition: WNL  ADL's:  Intact   Mental Status Per Nursing Assessment::   On Admission:     Demographic Factors:  Caucasian  Loss Factors: NA  Historical Factors: Impulsivity  Risk Reduction  Factors:   Positive therapeutic relationship  Continued Clinical Symptoms:  Previous Psychiatric Diagnoses and Treatments Medical Diagnoses and Treatments/Surgeries  Cognitive Features That Contribute To Risk:  None    Suicide Risk:  Minimal: No identifiable suicidal ideation.  Patients presenting with no risk factors but with morbid ruminations; may be classified as minimal risk based on the severity of the depressive symptoms    Plan Of Care/Follow-up recommendations:  Activity:  no restrictions Diet:  regular Tests:  as needed Other:  follow up with aftercare  Eappen,Saramma, MD 07/30/2016, 3:01 PM

## 2016-07-30 NOTE — Progress Notes (Signed)
[facility] MD Progress Note  07/30/2016 12:19 PM Meredith [name]  MRN:  [id number] Subjective: Pt states " I am improving."    Objective:Patient seen and chart reviewed.Discussed patient with treatment team.  Pt continues to struggle with some sedation to medications. Overall making progress. She is still having AH - but states she is coping well. Since she has drowsiness on some of the medications , will not increase it today.Observe patient today. Per RN she continues to need encouragement and support.     Principal Problem: Bipolar disorder, curr episode mixed, severe, with psychotic features ([place]) Diagnosis:   Patient Active Problem List   Diagnosis Date Noted  . Bipolar disorder, curr episode mixed, severe, with psychotic features ([place]) [F31.64] 07/28/2016  . Atrial fibrillation ([place]) [I48.91] 11/14/2015   Total Time spent with patient: 20 minutes  Past Psychiatric History: Please see H&P.   Past Medical History:  Past Medical History:  Diagnosis Date  . Anxiety   . Atrial fibrillation ([place])   . Bipolar 2 disorder ([place])   . Depression   . History of cardioversion    x2  . Hyperlipidemia   . Hypothyroidism   . Migraine headache   . Osteoporosis     Past Surgical History:  Procedure Laterality Date  . ABLATION    . BACK SURGERY  2014  . CARDIOVERSION  12/2013, 01/2014   x2  . KNEE SURGERY     ORTHOSCOPIC  . LUNG REMOVAL, PARTIAL Right    BENIGN LUNG CYST  . THORACIC SPINE SURGERY Right   . THUMB ARTHROSCOPY     Family History:  Family History  Problem Relation Age of Onset  . Atrial fibrillation Brother    Family Psychiatric  History: Please see H&P.  Social History:  History  Alcohol Use No     History  Drug Use No    Social History   Social History  . Marital status: Single    Spouse name: N/A  . Number of children: N/A  . Years of education: N/A   Social History Main Topics  . Smoking status: Never Smoker  . Smokeless tobacco: Never Used   . Alcohol use No  . Drug use: No  . Sexual activity: Not Asked   Other Topics Concern  . None   Social History Narrative  . None   Additional Social History:                         Sleep: Fair  Appetite:  Fair  Current Medications: Current Facility-Administered Medications  Medication Dose Route Frequency Provider Last Rate Last Dose  . acetaminophen (TYLENOL) tablet 650 mg  650 mg Oral Q6H PRN Rozetta Nunnery, NP      . alum & mag hydroxide-simeth (MAALOX/MYLANTA) 200-200-20 MG/5ML suspension 30 mL  30 mL Oral Q4H PRN Lindon Romp A, NP      . calcium carbonate (OS-CAL - dosed in mg of elemental calcium) tablet 500 mg of elemental calcium  1 tablet Oral Daily Nwoko, Agnes I, NP   500 mg of elemental calcium at 07/30/16 0735  . clotrimazole (LOTRIMIN) 1 % cream   Topical BID PRN Lindon Romp A, NP      . estradiol (ESTRACE) tablet 1.5 mg  1.5 mg Oral Daily Nwoko, Agnes I, NP   1.5 mg at 07/30/16 0734  . eszopiclone (LUNESTA) tablet 1 mg  1 mg Oral QHS Eappen, Saramma, MD   1 mg at  07/29/16 2128  . ferrous sulfate tablet 325 mg  325 mg Oral Daily Lindell Spar I, NP   325 mg at 07/30/16 0737  . HYDROcodone-acetaminophen (NORCO/VICODIN) 5-325 MG per tablet 1.5 tablet  1.5 tablet Oral BID PRN Lindell Spar I, NP   1.5 tablet at 07/30/16 0733  . hydrOXYzine (ATARAX/VISTARIL) tablet 50 mg  50 mg Oral TID PRN Hampton Abbot, MD   50 mg at 07/28/16 2112  . levothyroxine (SYNTHROID, LEVOTHROID) tablet 112 mcg  112 mcg Oral QAC breakfast Lindon Romp A, NP   112 mcg at 07/30/16 8185  . magnesium hydroxide (MILK OF MAGNESIA) suspension 30 mL  30 mL Oral Daily PRN Lindon Romp A, NP      . ondansetron (ZOFRAN) tablet 4 mg  4 mg Oral TID PRN Lindon Romp A, NP      . pantoprazole (PROTONIX) EC tablet 40 mg  40 mg Oral Daily Lindon Romp A, NP   40 mg at 07/30/16 0736  . pregabalin (LYRICA) capsule 150 mg  150 mg Oral TID Lindell Spar I, NP   150 mg at 07/30/16 1141  . QUEtiapine  (SEROQUEL XR) 24 hr tablet 200 mg  200 mg Oral QHS Hampton Abbot, MD   200 mg at 07/29/16 2128  . senna-docusate (Senokot-S) tablet 1 tablet  1 tablet Oral BID Ursula Alert, MD   1 tablet at 07/30/16 0736  . traZODone (DESYREL) tablet 100 mg  100 mg Oral QHS PRN Eappen, Saramma, MD      . verapamil (CALAN-SR) CR tablet 120 mg  120 mg Oral QHS Lindon Romp A, NP   120 mg at 07/29/16 2128    Lab Results:  Results for orders placed or performed during the hospital encounter of 07/27/16 (from the past 48 hour(s))  T4, free     Status: None   Collection Time: 07/28/16  6:24 PM  Result Value Ref Range   Free T4 0.95 0.61 - 1.12 ng/dL    Comment: (NOTE) Biotin ingestion may interfere with free T4 tests. If the results are inconsistent with the TSH level, previous test results, or the clinical presentation, then consider biotin interference. If needed, order repeat testing after stopping biotin. Performed at [facility], [place] [address], [place], [place] [address]   T3     Status: None   Collection Time: 07/28/16  6:24 PM  Result Value Ref Range   T3, Total 129 71 - 180 ng/dL    Comment: (NOTE) Performed At: [facility] [place], [place] [id number] Lindon Romp MD [id number] Performed at [facility], [place] [address], [place], [place] [address]     Blood Alcohol level:  Lab Results  Component Value Date   ETH <5 09/47/0962    Metabolic Disorder Labs: Lab Results  Component Value Date   HGBA1C 5.3 07/27/2016   MPG 105 07/27/2016   Lab Results  Component Value Date   PROLACTIN 19.1 07/27/2016   Lab Results  Component Value Date   CHOL 224 (H) 07/27/2016   TRIG 92 07/27/2016   HDL 118 07/27/2016   CHOLHDL 1.9 07/27/2016   VLDL 18 07/27/2016   LDLCALC 88 07/27/2016    Physical Findings: AIMS:  , ,  ,  ,    CIWA:    COWS:     Musculoskeletal: Strength & Muscle Tone: within normal limits Gait & Station:  normal Patient leans: N/A  Psychiatric Specialty Exam: Physical Exam  Nursing note  and vitals reviewed.   Review of Systems  Psychiatric/Behavioral: Positive for hallucinations. The patient is nervous/anxious.   All other systems reviewed and are negative.   Blood pressure (!) 104/58, pulse (!) 126, temperature 98.5 F (36.9 C), temperature source Oral, resp. rate 18, height 4\' 10"  (1.473 m), weight 79.8 kg (176 lb), SpO2 98 %.Body mass index is 36.78 kg/m.  General Appearance: Fairly Groomed  Eye Contact:  Fair  Speech:  Normal Rate  Volume:  Normal  Mood:  Depressed improved   Affect:  Congruent  Thought Process:  Goal Directed and Descriptions of Associations: Circumstantial  Orientation:  Other:  oriented to self, situation  Thought Content:  Rumination, AH - improving  Suicidal Thoughts:  No  Homicidal Thoughts:  No  Memory:  Immediate;   Fair Recent;   Fair Remote;   Fair  Judgement:  Fair  Insight:  Shallow  Psychomotor Activity:  Normal  Concentration:  Concentration: Fair and Attention Span: Fair  Recall:  [employer] of Knowledge:  Fair  Language:  Fair  Akathisia:  No  Handed:  Right  AIMS (if indicated):     Assets:  Desire for Improvement  ADL's:  Intact  Cognition:  WNL  Sleep:  Number of Hours: 5.5   Bipolar disorder, curr episode mixed, severe, with psychotic features ([place]) improving  Will continue today 07/30/16 plan as below except where it is noted.      Treatment Plan Summary:Patient with some on and off sedation on medications , she is on multiple medications , will observe closely . She is doing overall well on this medication combination - hence continue to monitor closely .  Daily contact with patient to assess and evaluate symptoms and progress in treatment, Medication management and Plan see below  Continue Seroquel xr 200 mg po qhs for psychosis/mood sx. Continue Lunesta 1 mg po qhs for insomnia. Will discontinue Trazodone for lack of  efficacy. Make use of other pain medications for pain issues. Reviewed [place] controlled substance database . Reviewed labs - tsh - elevated , reviewed t4- wnl.t3 reviewed - wnl. CSW will continue to work on disposition.   Eappen,Saramma, MD 07/30/2016, 12:19 PM

## 2016-07-30 NOTE — [facility] Group Notes (Signed)
[place] LCSW Group Therapy  07/30/2016 1:15 pm  Type of Therapy: Process Group Therapy  Participation Level:  Active  Participation Quality:  Appropriate  Affect:  Flat  Cognitive:  Oriented  Insight:  Improving  Engagement in Group:  Limited  Engagement in Therapy:  Limited  Modes of Intervention:  Activity, Clarification, Education, Problem-solving and Support  Summary of Progress/Problems: Today's group addressed the issue of overcoming obstacles.  Patients were asked to identify their biggest obstacle post d/c that stands in the way of their on-going success, and then problem solve as to how to manage this. Stayed the entire time, engaged throughout.  Talked about needing to be assertive vs passive.  Trish Mage 07/30/2016   4:58 PM

## 2016-07-30 NOTE — Progress Notes (Signed)
Nursing Note 07/30/2016 [id number]  Data Reports sleeping good with PRN sleep med.  Rates depression 0/10, hopelessness 0/10, and anxiety 1/10. Affect blunted but appropriate.  Denies HI, SI, AVH.  Interacting appropriately in milieu, spends most of free time in room but attending groups.  C/O ongoing chronic back pain.   Action Spoke with patient 1:1, nurse offered support to patient throughout shift.  Continues to be monitored on 15 minute checks for safety.  Response Remains safe and appropriate on unit.

## 2016-07-31 DIAGNOSIS — G894 Chronic pain syndrome: Secondary | ICD-10-CM

## 2016-07-31 MED ORDER — HYDROCODONE-ACETAMINOPHEN 5-325 MG PO TABS: 1.5000 | ORAL_TABLET | Freq: Two times a day (BID) | ORAL | 0 refills | Status: DC | PRN

## 2016-07-31 MED ORDER — FERROUS SULFATE 325 (65 FE) MG PO TABS: 325.0000 mg | ORAL_TABLET | Freq: Every day | ORAL | 3 refills | Status: DC

## 2016-07-31 MED ORDER — ESTRADIOL 1 MG PO TABS: 1.5000 mg | ORAL_TABLET | Freq: Every day | ORAL | Status: DC

## 2016-07-31 MED ORDER — QUETIAPINE FUMARATE ER 200 MG PO TB24: 200.0000 mg | ORAL_TABLET | Freq: Every day | ORAL | 0 refills | Status: DC

## 2016-07-31 MED ORDER — ONDANSETRON HCL 4 MG PO TABS: 4.0000 mg | ORAL_TABLET | Freq: Three times a day (TID) | ORAL | 0 refills | Status: DC | PRN

## 2016-07-31 MED ORDER — ESZOPICLONE 1 MG PO TABS: 1.0000 mg | ORAL_TABLET | Freq: Every day | ORAL | 0 refills | Status: DC

## 2016-07-31 MED ORDER — CLOTRIMAZOLE 1 % EX CREA: 1.0000 "application " | TOPICAL_CREAM | Freq: Two times a day (BID) | CUTANEOUS | 0 refills | Status: DC

## 2016-07-31 MED ORDER — LEVOTHYROXINE SODIUM 112 MCG PO TABS: 112.0000 ug | ORAL_TABLET | Freq: Every day | ORAL | Status: DC

## 2016-07-31 MED ORDER — CALCIUM CARBONATE 1250 (500 CA) MG PO TABS: 1.0000 | ORAL_TABLET | Freq: Every day | ORAL | Status: DC

## 2016-07-31 MED ORDER — ESOMEPRAZOLE MAGNESIUM 40 MG PO CPDR: 40.0000 mg | DELAYED_RELEASE_CAPSULE | Freq: Every day | ORAL | Status: DC | PRN

## 2016-07-31 MED ORDER — SENNOSIDES-DOCUSATE SODIUM 8.6-50 MG PO TABS: 2.0000 | ORAL_TABLET | Freq: Two times a day (BID) | ORAL | Status: DC

## 2016-07-31 MED ORDER — VERAPAMIL HCL ER 120 MG PO CP24: 120.0000 mg | ORAL_CAPSULE | Freq: Every day | ORAL | Status: DC

## 2016-07-31 MED ORDER — PREGABALIN 150 MG PO CAPS: 150.0000 mg | ORAL_CAPSULE | Freq: Three times a day (TID) | ORAL | Status: DC

## 2016-07-31 MED ORDER — HYDROXYZINE HCL 50 MG PO TABS: 50.0000 mg | ORAL_TABLET | Freq: Three times a day (TID) | ORAL | 0 refills | Status: DC | PRN

## 2016-07-31 NOTE — Progress Notes (Signed)
Patient discharged to lobby. Patient was stable and appreciative at that time. All papers, samples and prescriptions were given and valuables returned. Verbal understanding expressed. Denies SI/HI and A/VH. Patient given opportunity to express concerns and ask questions.  

## 2016-07-31 NOTE — Plan of Care (Signed)
Problem: [facility] Participation in Recreation Therapeutic Interventions Goal: STG-Patient will identify at least five coping skills for ** STG: Coping Skills - Patient will be able to identify at least 5 coping skills for psychosis by conclusion of recreation therapy tx   Outcome: Completed/Met Date Met: 07/31/16 Pt was able to identify coping skills at completion of coping skills recreation therapy session.  Victorino Sparrow, LRT/CTRS

## 2016-07-31 NOTE — Progress Notes (Signed)
Meredith [name] has been up and visible on the unit.  She became upset during an emergency situation on the unit.  She was upset stating that "no one was helping me and did there really need to be 4 people dealing with that emergency."  Apologized to her about the situation and the need for added staff members.  Explained that RN was here now and would gladly talk with her.  Vistaril given prn for the anxiety.  After the conversation, she was able to calm down and quickly switched the conversation to how she was ready to go home because her brother is leaving town Wednesday and her cat will have no one to take care of her. She is now resting quietly in her room.  Charge nurse notified about her concerns when the emergency was happening on the unit.  She denied any SI/HI or A/V hallucinations.  Q 15 minute checks maintained for safety.  We will continue to monitor the progress towards her goals.  She remains safe on the unit.

## 2016-07-31 NOTE — Discharge Summary (Signed)
Physician Discharge Summary Note  Patient:  Meredith [name] is an 66 y.o., female  MRN:  [id number]  DOB:  1950/03/09  Patient phone:  [phone] (home)   Patient address:   [address] [place] [address],   Total Time spent with patient: Greater than 30 minutes  Date of Admission:  07/27/2016  Date of Discharge: 07-31-16  Reason for Admission: Worsening symptoms of depression with hallucinations.    Principal Problem: Bipolar disorder, curr episode mixed, severe, with psychotic features [facility])  Discharge Diagnoses: Patient Active Problem List   Diagnosis Date Noted  . Chronic pain syndrome [G89.4] 07/30/2016  . Bipolar disorder, curr episode mixed, severe, with psychotic features ([place]) [F31.64] 07/28/2016  . Atrial fibrillation ([place]) [I48.91] 11/14/2015   Past Psychiatric History: See H&P  Past Medical History:  Past Medical History:  Diagnosis Date  . Anxiety   . Atrial fibrillation ([place])   . Bipolar 2 disorder ([place])   . Depression   . History of cardioversion    x2  . Hyperlipidemia   . Hypothyroidism   . Migraine headache   . Osteoporosis     Past Surgical History:  Procedure Laterality Date  . ABLATION    . BACK SURGERY  2014  . CARDIOVERSION  12/2013, 01/2014   x2  . KNEE SURGERY     ORTHOSCOPIC  . LUNG REMOVAL, PARTIAL Right    BENIGN LUNG CYST  . THORACIC SPINE SURGERY Right   . THUMB ARTHROSCOPY     Family History:  Family History  Problem Relation Age of Onset  . Atrial fibrillation Brother    Family Psychiatric  History: See Md's SRA  Social History:  History  Alcohol Use No     History  Drug Use No    Social History   Social History  . Marital status: Single    Spouse name: N/A  . Number of children: N/A  . Years of education: N/A   Social History Main Topics  . Smoking status: Never Smoker  . Smokeless tobacco: Never Used  . Alcohol use No  . Drug use: No  . Sexual activity: Not Asked   Other Topics  Concern  . None   Social History Narrative  . None   Hospital Course: (per admission assessment): Patient is a 66 year old female who presented to [facility] yesterday as a walk-in accompanied by her twin brother for complaints of worsening of depression along with command hallucinations telling her to kill herself.  Patient states that [name] her new outpatient provider took her off Seroquel as she was gaining weight from it and put her on Geodon. He adds that prior to the Seroquel she was on Seroquel XL and was doing fairly well on it. She states that [name] change her to regular Seroquel and she started gaining weight. She has that since she's been on the Geodon, her mood has worsened, she feels depressed, overwhelmed, started hearing voices telling her to kill herself. Patient also reports that she's been having crying spells, difficulty in thinking and concentrating, not sleeping and having struggles with her appetite. She also gives history of panic attacks. She has that all this has happened in the last few weeks due to her change from Seroquel to Geodon. She states that the doctor also took her off her Valium and Restoril in so she's not sleeping at night. She states that her anxiety has increased since the Valium was discontinued.  After evaluation of her presenting  symptoms, Meredith [name] was started on medication regimen targeting those presenting symptoms. She was medicated & discharged on; Seroquel 200 mg for mood control & Hydroxyzine 50 mg prn for anxiety. She also enrolled & participated in the group counseling sessions being offered & held on this unit. She learned coping skills. She presented other significant pre-existing medical issues that required treatment. She was resumed on all her pertinent home medications for those health issues. She tolerated her treatment regimen without any adverse effects or reactions reported.  Meredith [name]'s symptoms responded well to her treatment regimen. This is  evidenced by her reports of improved mood & resolution of the psychosis. She is seen today by her attending physician for discharge. She has normal anxiety about going home. She is not overwhelmed by this. Not expressing any delusions today. No hallucination. Feels in control of herself. No passivity of thought. No passivity of will. No fantasy about suicide lately. No suicidal thoughts.  No thoughts of violence. No craving for drugs. Does not feel depressed. No evidence of mania.  Nursing staff reports that patient has been appropriate on the unit. Patient has been interacting well with peers. No behavioral issues. Patient has not voiced any suicidal thoughts. Patient has not been observed to be internally stimulated or preoccupied. Patient has been adherent with treatment recommendations. Patient has been tolerating her medication well. No reported adverse effects or reactions.   Patient was discussed at the treatment team meeting this morning. Team members feels that patient is back to his baseline level of function. Team agrees with plan to discharge patient today. Meredith [name] [facility] with all personal belongings in no apparent distress. Transportation per brother.   Physical Findings: AIMS: Facial and Oral Movements Muscles of Facial Expression: None, normal Lips and Perioral Area: None, normal Jaw: None, normal Tongue: None, normal,Extremity Movements Upper (arms, wrists, hands, fingers): None, normal Lower (legs, knees, ankles, toes): None, normal, Trunk Movements Neck, shoulders, hips: None, normal, Overall Severity Severity of abnormal movements (highest score from questions above): None, normal Incapacitation due to abnormal movements: None, normal Patient's awareness of abnormal movements (rate only patient's report): No Awareness, Dental Status Current problems with teeth and/or dentures?: No Does patient usually wear dentures?: No  CIWA:    COWS:     Musculoskeletal: Strength &  Muscle Tone: within normal limits Gait & Station: normal Patient leans: N/A  Psychiatric Specialty Exam: Physical Exam  Constitutional: She is oriented to person, place, and time. She appears well-developed and well-nourished.  HENT:  Head: Normocephalic.  Eyes: Pupils are equal, round, and reactive to light.  Neck: Normal range of motion.  Cardiovascular: Normal rate.   Respiratory: Effort normal.  GI: Soft.  Genitourinary:  Genitourinary Comments: Deferred  Musculoskeletal: Normal range of motion.  Neurological: She is alert and oriented to person, place, and time.  Skin: Skin is warm and dry.    Review of Systems  Constitutional: Negative.   HENT: Negative.   Eyes: Negative.   Respiratory: Negative.   Cardiovascular: Negative.   Gastrointestinal: Negative.   Genitourinary: Negative.   Musculoskeletal: Negative.   Skin: Negative.   Neurological: Negative.   Endo/Heme/Allergies: Negative.   Psychiatric/Behavioral: Positive for depression (Stable) and substance abuse (Hx. Opioid use disorder). Negative for suicidal ideas.    Blood pressure 118/61, pulse (!) 127, temperature 97.5 F (36.4 C), temperature source Oral, resp. rate 20, height 4\' 10"  (1.473 m), weight 79.8 kg (176 lb), SpO2 98 %.Body mass index is 36.78 kg/m.  See Md's SRA  Has this patient used any form of tobacco in the last 30 days? (Cigarettes, Smokeless Tobacco, Cigars, and/or Pipes)No  Blood Alcohol level:  Lab Results  Component Value Date   ETH <5 36/14/4315   Metabolic Disorder Labs:  Lab Results  Component Value Date   HGBA1C 5.3 07/27/2016   MPG 105 07/27/2016   Lab Results  Component Value Date   PROLACTIN 19.1 07/27/2016   Lab Results  Component Value Date   CHOL 224 (H) 07/27/2016   TRIG 92 07/27/2016   HDL 118 07/27/2016   CHOLHDL 1.9 07/27/2016   VLDL 18 07/27/2016   LDLCALC 88 07/27/2016   See Psychiatric Specialty Exam and Suicide Risk Assessment completed by Attending  Physician prior to discharge.  Discharge destination:  Home  Is patient on multiple antipsychotic therapies at discharge:  No   Has Patient had three or more failed trials of antipsychotic monotherapy by history:  No  Recommended Plan for Multiple Antipsychotic Therapies: NA  Allergies as of 07/31/2016      Reactions   Amoxicillin    Ampicillin    Celebrex [celecoxib]    Codeine    Penicillins    Sulfa Antibiotics    Tricyclic Antidepressants       Medication List    STOP taking these medications   cholecalciferol 1000 units tablet Commonly known as:  VITAMIN D   diphenhydrAMINE 50 MG capsule Commonly known as:  BENADRYL   HYDROcodone-acetaminophen 7.5-325 MG tablet Commonly known as:  NORCO Replaced by:  HYDROcodone-acetaminophen 5-325 MG tablet   metoprolol succinate 50 MG 24 hr tablet Commonly known as:  TOPROL-XL   venlafaxine XR 150 MG 24 hr capsule Commonly known as:  EFFEXOR-XR     TAKE these medications     Indication  calcium carbonate 1250 (500 Ca) MG tablet Commonly known as:  OS-CAL - dosed in mg of elemental calcium Take 1 tablet (500 mg of elemental calcium total) by mouth daily. For bone health Start taking on:  08/01/2016 What changed:  when to take this  additional instructions  Indication:  Bone health   clotrimazole 1 % cream Commonly known as:  LOTRIMIN Apply 1 application topically 2 (two) times daily. For yeast rash What changed:  additional instructions  Indication:  Yeast Rash   esomeprazole 40 MG capsule Commonly known as:  NEXIUM Take 1 capsule (40 mg total) by mouth daily as needed. For acid reflux What changed:  additional instructions  Indication:  Gastroesophageal Reflux Disease   estradiol 1 MG tablet Commonly known as:  ESTRACE Take 1.5 tablets (1.5 mg total) by mouth daily. For hormone replacement What changed:  additional instructions  Indication:  Deficiency of the Hormone Estrogen   eszopiclone 1 MG Tabs  tablet Commonly known as:  LUNESTA Take 1 tablet (1 mg total) by mouth at bedtime. Take immediately before bedtime: For sleep  Indication:  Trouble Sleeping   ferrous sulfate 325 (65 FE) MG tablet Take 1 tablet (325 mg total) by mouth daily with breakfast. Ferrous sulfate What changed:  additional instructions  Indication:  Iron Deficiency   HYDROcodone-acetaminophen 5-325 MG tablet Commonly known as:  NORCO/VICODIN Take 1.5 tablets by mouth 2 (two) times daily as needed for moderate pain. For chronic pain Replaces:  HYDROcodone-acetaminophen 7.5-325 MG tablet  Indication:  Moderate to Moderately Severe Pain   hydrOXYzine 50 MG tablet Commonly known as:  ATARAX/VISTARIL Take 1 tablet (50 mg total) by mouth 3 (three) times daily as needed for anxiety.  Indication:  Anxiety Neurosis   levothyroxine 112 MCG tablet Commonly known as:  SYNTHROID, LEVOTHROID Take 1 tablet (112 mcg total) by mouth daily before breakfast. For low thyroid hormone What changed:  additional instructions  Indication:  Underactive Thyroid   ondansetron 4 MG tablet Commonly known as:  ZOFRAN Take 1 tablet (4 mg total) by mouth 3 (three) times daily as needed. As needed for nausea  Indication:  Nausea/vomiting   pregabalin 150 MG capsule Commonly known as:  LYRICA Take 1 capsule (150 mg total) by mouth 3 (three) times daily. For nerve pain What changed:  additional instructions  Indication:  Neuropathic Pain   QUEtiapine 200 MG 24 hr tablet Commonly known as:  SEROQUEL XR Take 1 tablet (200 mg total) by mouth at bedtime. For mood control  Indication:  Mood control   senna-docusate 8.6-50 MG tablet Commonly known as:  PERI-COLACE Take 2 tablets by mouth 2 (two) times daily. For constipation What changed:  additional instructions  Indication:  Constipation   verapamil 120 MG 24 hr capsule Commonly known as:  VERELAN PM Take 1 capsule (120 mg total) by mouth at bedtime. For high blood pressure What  changed:  additional instructions  Indication:  High Blood Pressure of Unknown Cause      Follow-up Information    [facility] Follow up on 08/08/2016.   Specialty:  Behavioral Health Why:  Wednesday at 1:00 with Dr Cheryln Manly.  They put you on the cancwellation list, and will call if they have a sooner appointment Contact information: [place] [phone]       [facility].   Specialty:  Behavioral Health Why:  You will need to call them for your first appointment as they did not get back to me with an appointment after I faxed your information Contact information: [place] [address] [phone]          Follow-up recommendations: Activity:  As tolerated Diet: As recommended by your primary care doctor. Keep all scheduled follow-up appointments as recommended.    Comments: Patient is instructed prior to discharge to: Take all medications as prescribed by his/her mental healthcare provider. Report any adverse effects and or reactions from the medicines to his/her outpatient provider promptly. Patient has been instructed & cautioned: To not engage in alcohol and or illegal drug use while on prescription medicines. In the event of worsening symptoms, patient is instructed to call the crisis hotline, 911 and or go to the nearest ED for appropriate evaluation and treatment of symptoms. To follow-up with his/her primary care provider for your other medical issues, concerns and or health care needs.   Signed: Encarnacion Slates, NP, PMHNP, FNP-BC 07/31/2016, 10:14 AM  Patient seen, Suicide Assessment Completed.  Disposition Plan Reviewed

## 2016-07-31 NOTE — Progress Notes (Signed)
Recreation Therapy Notes  Date: 07/31/16 Time: 1000 Location: 500 Hall Dayroom  Group Topic: Coping Skills  Goal Area(s) Addresses:  Patients will be able to identify positive coping skills. Patients will be able to identify the benefits of using coping skills. Patients will be able to identify benefits of using coping skills post d/c.  Behavioral Response: Engaged  Intervention: Coping Skills  Activity: Building surveyor.  Patients were to picture themselves stuck to the center of the web.  Patients were to then write the all the things and situations they feel have them stuck within the web.  Lastly, patients were to then identify the coping skills they can use to combat the things that have them stuck.  Education:Coping Skills, Discharge Planning.   Education Outcome: Acknowledges understanding/In group clarification offered/Needs additional education.   Clinical Observations/Feedback: Pt stated coping skills "help you get through life".  Things that keep pt stuck are not concentrating, stress, anger, people she doesn't want in her life, people being dismissive and not being listened to.  Pt stated her coping skills are prayer, putting self first, keeping psychiatric appointments and support.  Pt explained that she uses positive coping skills when dealing with negative situations or people because "I wouldn't feel right inside or with myself if I used negative coping skills".   Victorino Sparrow, LRT/CTRS        Victorino Sparrow A 07/31/2016 12:38 PM

## 2016-07-31 NOTE — BHH Suicide Risk Assessment (Signed)
[place] INPATIENT:  Family/Significant Other Suicide Prevention Education  Suicide Prevention Education:  Education Completed; No one has been identified by the patient as the family member/significant other with whom the patient will be residing, and identified as the person(s) who will aid the patient in the event of a mental health crisis (suicidal ideations/suicide attempt).  With written consent from the patient, the family member/significant other has been provided the following suicide prevention education, prior to the and/or following the discharge of the patient.  The suicide prevention education provided includes the following:  Suicide risk factors  Suicide prevention and interventions  National Suicide Hotline telephone number  [facility] assessment telephone number  [facility] Emergency Assistance [place] and/or Residential Mobile Crisis Unit telephone number  Request made of family/significant other to:  Remove weapons (e.g., guns, rifles, knives), all items previously/currently identified as safety concern.    Remove drugs/medications (over-the-counter, prescriptions, illicit drugs), all items previously/currently identified as a safety concern.  The family member/significant other verbalizes understanding of the suicide prevention education information provided.  The family member/significant other agrees to remove the items of safety concern listed above. The patient did not endorse SI at the time of admission, nor did the patient c/o SI during the stay here.  SPE not required.   [place] 07/31/2016, 10:23 AM

## 2016-07-31 NOTE — Progress Notes (Signed)
  [facility] Adult Case Management Discharge Plan :  Will you be returning to the same living situation after discharge:  Yes,  home At discharge, do you have transportation home?: Yes,  brother Do you have the ability to pay for your medications: Yes,  insurance  Release of information consent forms completed and in the chart;  Patient's signature needed at discharge.  Patient to Follow up at: Follow-up Information    [facility] Follow up on 08/08/2016.   Specialty:  Behavioral Health Why:  Wednesday at 1:00 with Dr Cheryln Manly.  They put you on the cancwellation list, and will call if they have a sooner appointment Contact information: [place] [phone]       [facility].   Specialty:  Behavioral Health Why:  You will need to call them for your first appointment as they did not get back to me with an appointment after I faxed your information Contact information: [name] [address] [phone]           Next level of care provider has access to [place] and Suicide Prevention discussed: Yes,  yes     Has patient been referred to the Quitline?: N/A patient is not a smoker  Patient has been referred for addiction treatment: [facility] 07/31/2016, 10:19 AM

## 2016-07-31 NOTE — BHH Suicide Risk Assessment (Addendum)
[facility] Discharge Suicide Risk Assessment   Principal Problem: Bipolar disorder, curr episode mixed, severe, with psychotic features [facility]) Discharge Diagnoses:  Patient Active Problem List   Diagnosis Date Noted  . Chronic pain syndrome [G89.4] 07/30/2016  . Bipolar disorder, curr episode mixed, severe, with psychotic features ([place]) [F31.64] 07/28/2016  . Atrial fibrillation ([place]) [I48.91] 11/14/2015    Total Time spent with patient: 30 minutes  Musculoskeletal: Strength & Muscle Tone: within normal limits Gait & Station: normal Patient leans: N/A  Psychiatric Specialty Exam: ROS denies headache, denies chest pain, describes back pain, no dyspnea, no vomiting   Blood pressure 118/61, pulse (!) 127, temperature 97.5 F (36.4 C), temperature source Oral, resp. rate 20, height 4\' 10"  (1.473 m), weight 79.8 kg (176 lb), SpO2 98 %.Body mass index is 36.78 kg/m.  General Appearance: Well Groomed  Eye Contact::  Good  Speech:  Normal Rate409  Volume:  Normal  Mood:  patient states she feels better, describes mood as improved and " pretty good "  Affect:  Appropriate  Thought Process:  Linear and Descriptions of Associations: Intact  Orientation:  Other:  fully alert and attentive   Thought Content:  denies hallucinations, no delusions , not internally preoccupied   Suicidal Thoughts:  No denies any suicidal or self injurious ideations, denies any homicidal or violent ideations   Homicidal Thoughts:  No  Memory:  recent and remote grossly intact  Judgement:  Fair- improved   Insight:  Fair- improved   Psychomotor Activity:  Normal  Concentration:  Good  Recall:  Good  Fund of Knowledge:Good  Language: Good  Akathisia:  Negative  Handed:  Right  AIMS (if indicated):   no abnormal or involuntary movements noted or reported   Assets:  Desire for Improvement Resilience  Sleep:  Number of Hours: 4.75  Cognition: WNL  ADL's:  Intact   Mental Status Per Nursing Assessment::   On  Admission:     Demographic Factors:  66 year old female , divorced, lives alone, has Scientific laboratory technician, manages own monies   Loss Factors: Recent medication changes   Historical Factors: History of Bipolar Disorder, history of previous psychiatric admissions,one suicide attempt in her 18's  Risk Reduction Factors:   Positive social support and Positive coping skills or problem solving skills  Continued Clinical Symptoms:  At this time patient presents alert, attentive , well related , calm, mood is described as "OK" , denies depression, presents euthymic no thought disorder, no suicidal ideations, denies any homicidal or violent ideations, no hallucinations, no delusions , not internally preoccupied. I have discussed case with RN /CSW staff- patient improved compared to admission, behavior calm and in good control.  Denies medication side effects. Denies sedation, and presents alert, attentive. D/C [place] prior to discharge.   Cognitive Features That Contribute To Risk:  No gross cognitive deficits noted upon discharge. Is alert , attentive, and oriented x 3   Suicide Risk:  Mild:  Suicidal ideation of limited frequency, intensity, duration, and specificity.  There are no identifiable plans, no associated intent, mild dysphoria and related symptoms, good self-control (both objective and subjective assessment), few other risk factors, and identifiable protective factors, including available and accessible social support.  Follow-up Information    [facility] Follow up on 08/08/2016.   Specialty:  Behavioral Health Why:  Wednesday at 1:00 with Dr Cheryln Manly.  They put you on the cancwellation list, and will call if they have a sooner appointment Contact information:  [place] [phone]       [facility] Follow up on 08/23/2016.   Specialty:  Behavioral Health Why:  Thurday at 2:00 with Dr  Ferd Hibbs information: [place] [address] [phone]           Plan Of Care/Follow-up recommendations:  Activity:  as tolerated Diet:  Heart Healthy Tests:  NA Other:  See below  Patient reports she is feeling much better and feels ready for discharge today. She is leaving in good spirits. At this time there are no grounds for involuntary commitment . She is returning home and following up as outpatient - see above   Jenne Campus, MD 07/31/2016, 12:40 PM

## 2016-08-02 DIAGNOSIS — B372 Candidiasis of skin and nail: Secondary | ICD-10-CM | POA: Diagnosis not present

## 2016-08-02 DIAGNOSIS — N611 Abscess of the breast and nipple: Secondary | ICD-10-CM | POA: Diagnosis not present

## 2016-08-02 DIAGNOSIS — M1812 Unilateral primary osteoarthritis of first carpometacarpal joint, left hand: Secondary | ICD-10-CM | POA: Diagnosis not present

## 2016-08-02 DIAGNOSIS — Z4789 Encounter for other orthopedic aftercare: Secondary | ICD-10-CM | POA: Diagnosis not present

## 2016-08-06 DIAGNOSIS — M47816 Spondylosis without myelopathy or radiculopathy, lumbar region: Secondary | ICD-10-CM | POA: Diagnosis not present

## 2016-08-07 DIAGNOSIS — M542 Cervicalgia: Secondary | ICD-10-CM | POA: Diagnosis not present

## 2016-08-07 DIAGNOSIS — M791 Myalgia: Secondary | ICD-10-CM | POA: Diagnosis not present

## 2016-08-07 DIAGNOSIS — G518 Other disorders of facial nerve: Secondary | ICD-10-CM | POA: Diagnosis not present

## 2016-08-07 DIAGNOSIS — G43719 Chronic migraine without aura, intractable, without status migrainosus: Secondary | ICD-10-CM | POA: Diagnosis not present

## 2016-08-07 DIAGNOSIS — R51 Headache: Secondary | ICD-10-CM | POA: Diagnosis not present

## 2016-08-08 ENCOUNTER — Ambulatory Visit (INDEPENDENT_AMBULATORY_CARE_PROVIDER_SITE_OTHER): Payer: Medicare Other | Admitting: Psychology

## 2016-08-08 DIAGNOSIS — F3181 Bipolar II disorder: Secondary | ICD-10-CM

## 2016-08-09 DIAGNOSIS — M1812 Unilateral primary osteoarthritis of first carpometacarpal joint, left hand: Secondary | ICD-10-CM | POA: Diagnosis not present

## 2016-08-20 DIAGNOSIS — M791 Myalgia: Secondary | ICD-10-CM | POA: Diagnosis not present

## 2016-08-20 DIAGNOSIS — R51 Headache: Secondary | ICD-10-CM | POA: Diagnosis not present

## 2016-08-20 DIAGNOSIS — G43719 Chronic migraine without aura, intractable, without status migrainosus: Secondary | ICD-10-CM | POA: Diagnosis not present

## 2016-08-20 DIAGNOSIS — M542 Cervicalgia: Secondary | ICD-10-CM | POA: Diagnosis not present

## 2016-08-20 DIAGNOSIS — G518 Other disorders of facial nerve: Secondary | ICD-10-CM | POA: Diagnosis not present

## 2016-08-22 ENCOUNTER — Ambulatory Visit (INDEPENDENT_AMBULATORY_CARE_PROVIDER_SITE_OTHER): Payer: Medicare Other | Admitting: Psychology

## 2016-08-22 DIAGNOSIS — F3181 Bipolar II disorder: Secondary | ICD-10-CM

## 2016-08-23 DIAGNOSIS — F411 Generalized anxiety disorder: Secondary | ICD-10-CM | POA: Diagnosis not present

## 2016-08-23 DIAGNOSIS — F315 Bipolar disorder, current episode depressed, severe, with psychotic features: Secondary | ICD-10-CM | POA: Diagnosis not present

## 2016-08-24 ENCOUNTER — Telehealth: Payer: Self-pay | Admitting: Cardiovascular Disease

## 2016-08-24 NOTE — Telephone Encounter (Signed)
Spoke to patient-patient reports she was recently discharged from [facility] and realized her discharge papers instructed her to stop her metoprolol.  Patient wondering if this is correct.     Per chart review:  Patient has hx of Afib, post ablation.  Metoprolol listed on ED note-not listed on other MD notes while admitted.   D/c at discharge.   Unable to locate reason as to why this was discontinued.  Advised I would route to Dr. Acie Fredrickson to review.   Patient aware and verbalized understanding.

## 2016-08-24 NOTE — Telephone Encounter (Signed)
Patient calling, states that she went to see Phychiatric and paperwork showed that her metoprolol medication was discontinued. Patient would like to verify why she was taken off the metoprolol medication.

## 2016-08-26 NOTE — Telephone Encounter (Signed)
It looks like they stopped metoprolol due to increasing depression

## 2016-08-27 MED ORDER — METOPROLOL SUCCINATE ER 50 MG PO TB24: 50.0000 mg | ORAL_TABLET | Freq: Every day | ORAL | 11 refills | Status: DC

## 2016-08-27 NOTE — Telephone Encounter (Signed)
Spoke with patient to discuss reason for stopping metoprolol. Patient states she was advised by her new psychiatrist that there is no reason she has to stop the Toprol. She states she would feel better taking the Toprol with her heart history. I advised that she may resume the Toprol and verified that she was taking 50 mg once daily prior to being advised to stop it. She verified this dose and asked for new Rx to [facility] on [address]. I advised her to call back if problems develop and she thanked me for the call.

## 2016-08-31 DIAGNOSIS — M1812 Unilateral primary osteoarthritis of first carpometacarpal joint, left hand: Secondary | ICD-10-CM | POA: Diagnosis not present

## 2016-09-04 DIAGNOSIS — Z79891 Long term (current) use of opiate analgesic: Secondary | ICD-10-CM | POA: Diagnosis not present

## 2016-09-04 DIAGNOSIS — M47814 Spondylosis without myelopathy or radiculopathy, thoracic region: Secondary | ICD-10-CM | POA: Diagnosis not present

## 2016-09-04 DIAGNOSIS — M47816 Spondylosis without myelopathy or radiculopathy, lumbar region: Secondary | ICD-10-CM | POA: Diagnosis not present

## 2016-09-04 DIAGNOSIS — G5600 Carpal tunnel syndrome, unspecified upper limb: Secondary | ICD-10-CM | POA: Diagnosis not present

## 2016-09-04 DIAGNOSIS — Z79899 Other long term (current) drug therapy: Secondary | ICD-10-CM | POA: Diagnosis not present

## 2016-09-04 DIAGNOSIS — G894 Chronic pain syndrome: Secondary | ICD-10-CM | POA: Diagnosis not present

## 2016-09-05 ENCOUNTER — Ambulatory Visit (INDEPENDENT_AMBULATORY_CARE_PROVIDER_SITE_OTHER): Payer: Medicare Other | Admitting: Psychology

## 2016-09-05 DIAGNOSIS — F3181 Bipolar II disorder: Secondary | ICD-10-CM

## 2016-09-10 DIAGNOSIS — G43719 Chronic migraine without aura, intractable, without status migrainosus: Secondary | ICD-10-CM | POA: Diagnosis not present

## 2016-09-14 DIAGNOSIS — F315 Bipolar disorder, current episode depressed, severe, with psychotic features: Secondary | ICD-10-CM | POA: Diagnosis not present

## 2016-09-17 DIAGNOSIS — M791 Myalgia: Secondary | ICD-10-CM | POA: Diagnosis not present

## 2016-09-17 DIAGNOSIS — R51 Headache: Secondary | ICD-10-CM | POA: Diagnosis not present

## 2016-09-17 DIAGNOSIS — G43719 Chronic migraine without aura, intractable, without status migrainosus: Secondary | ICD-10-CM | POA: Diagnosis not present

## 2016-09-17 DIAGNOSIS — M542 Cervicalgia: Secondary | ICD-10-CM | POA: Diagnosis not present

## 2016-09-17 DIAGNOSIS — G518 Other disorders of facial nerve: Secondary | ICD-10-CM | POA: Diagnosis not present

## 2016-09-19 ENCOUNTER — Ambulatory Visit (INDEPENDENT_AMBULATORY_CARE_PROVIDER_SITE_OTHER): Payer: Medicare Other | Admitting: Psychology

## 2016-09-19 DIAGNOSIS — F3181 Bipolar II disorder: Secondary | ICD-10-CM | POA: Diagnosis not present

## 2016-10-02 DIAGNOSIS — G894 Chronic pain syndrome: Secondary | ICD-10-CM | POA: Diagnosis not present

## 2016-10-02 DIAGNOSIS — M545 Low back pain: Secondary | ICD-10-CM | POA: Diagnosis not present

## 2016-10-02 DIAGNOSIS — M47814 Spondylosis without myelopathy or radiculopathy, thoracic region: Secondary | ICD-10-CM | POA: Diagnosis not present

## 2016-10-02 DIAGNOSIS — M47816 Spondylosis without myelopathy or radiculopathy, lumbar region: Secondary | ICD-10-CM | POA: Diagnosis not present

## 2016-10-03 ENCOUNTER — Ambulatory Visit (INDEPENDENT_AMBULATORY_CARE_PROVIDER_SITE_OTHER): Payer: Medicare Other | Admitting: Psychology

## 2016-10-03 DIAGNOSIS — F3181 Bipolar II disorder: Secondary | ICD-10-CM

## 2016-10-08 DIAGNOSIS — M542 Cervicalgia: Secondary | ICD-10-CM | POA: Diagnosis not present

## 2016-10-08 DIAGNOSIS — G43719 Chronic migraine without aura, intractable, without status migrainosus: Secondary | ICD-10-CM | POA: Diagnosis not present

## 2016-10-08 DIAGNOSIS — G518 Other disorders of facial nerve: Secondary | ICD-10-CM | POA: Diagnosis not present

## 2016-10-08 DIAGNOSIS — M791 Myalgia: Secondary | ICD-10-CM | POA: Diagnosis not present

## 2016-10-08 DIAGNOSIS — R51 Headache: Secondary | ICD-10-CM | POA: Diagnosis not present

## 2016-10-17 ENCOUNTER — Ambulatory Visit (INDEPENDENT_AMBULATORY_CARE_PROVIDER_SITE_OTHER): Payer: Medicare Other | Admitting: Psychology

## 2016-10-17 DIAGNOSIS — F3181 Bipolar II disorder: Secondary | ICD-10-CM | POA: Diagnosis not present

## 2016-10-30 DIAGNOSIS — G518 Other disorders of facial nerve: Secondary | ICD-10-CM | POA: Diagnosis not present

## 2016-10-30 DIAGNOSIS — M542 Cervicalgia: Secondary | ICD-10-CM | POA: Diagnosis not present

## 2016-10-30 DIAGNOSIS — R51 Headache: Secondary | ICD-10-CM | POA: Diagnosis not present

## 2016-10-30 DIAGNOSIS — G43719 Chronic migraine without aura, intractable, without status migrainosus: Secondary | ICD-10-CM | POA: Diagnosis not present

## 2016-10-30 DIAGNOSIS — M791 Myalgia: Secondary | ICD-10-CM | POA: Diagnosis not present

## 2016-10-31 ENCOUNTER — Ambulatory Visit (INDEPENDENT_AMBULATORY_CARE_PROVIDER_SITE_OTHER): Payer: Medicare Other | Admitting: Psychology

## 2016-10-31 DIAGNOSIS — F315 Bipolar disorder, current episode depressed, severe, with psychotic features: Secondary | ICD-10-CM | POA: Diagnosis not present

## 2016-10-31 DIAGNOSIS — F3181 Bipolar II disorder: Secondary | ICD-10-CM | POA: Diagnosis not present

## 2016-11-01 DIAGNOSIS — G894 Chronic pain syndrome: Secondary | ICD-10-CM | POA: Diagnosis not present

## 2016-11-01 DIAGNOSIS — M545 Low back pain: Secondary | ICD-10-CM | POA: Diagnosis not present

## 2016-11-01 DIAGNOSIS — M47816 Spondylosis without myelopathy or radiculopathy, lumbar region: Secondary | ICD-10-CM | POA: Diagnosis not present

## 2016-11-01 DIAGNOSIS — Z79891 Long term (current) use of opiate analgesic: Secondary | ICD-10-CM | POA: Diagnosis not present

## 2016-11-01 DIAGNOSIS — M47814 Spondylosis without myelopathy or radiculopathy, thoracic region: Secondary | ICD-10-CM | POA: Diagnosis not present

## 2016-11-01 DIAGNOSIS — Z79899 Other long term (current) drug therapy: Secondary | ICD-10-CM | POA: Diagnosis not present

## 2016-11-06 ENCOUNTER — Ambulatory Visit: Payer: Self-pay | Admitting: Cardiovascular Disease

## 2016-11-08 DIAGNOSIS — M169 Osteoarthritis of hip, unspecified: Secondary | ICD-10-CM | POA: Diagnosis not present

## 2016-11-16 ENCOUNTER — Ambulatory Visit (INDEPENDENT_AMBULATORY_CARE_PROVIDER_SITE_OTHER): Payer: Medicare Other | Admitting: Psychology

## 2016-11-16 DIAGNOSIS — F3181 Bipolar II disorder: Secondary | ICD-10-CM | POA: Diagnosis not present

## 2016-11-19 DIAGNOSIS — E7800 Pure hypercholesterolemia, unspecified: Secondary | ICD-10-CM | POA: Diagnosis not present

## 2016-11-19 DIAGNOSIS — N959 Unspecified menopausal and perimenopausal disorder: Secondary | ICD-10-CM | POA: Diagnosis not present

## 2016-11-19 DIAGNOSIS — Z1159 Encounter for screening for other viral diseases: Secondary | ICD-10-CM | POA: Diagnosis not present

## 2016-11-19 DIAGNOSIS — E039 Hypothyroidism, unspecified: Secondary | ICD-10-CM | POA: Diagnosis not present

## 2016-11-19 DIAGNOSIS — Z23 Encounter for immunization: Secondary | ICD-10-CM | POA: Diagnosis not present

## 2016-11-27 DIAGNOSIS — F315 Bipolar disorder, current episode depressed, severe, with psychotic features: Secondary | ICD-10-CM | POA: Diagnosis not present

## 2016-11-28 ENCOUNTER — Ambulatory Visit (INDEPENDENT_AMBULATORY_CARE_PROVIDER_SITE_OTHER): Payer: Medicare Other | Admitting: Psychology

## 2016-11-28 DIAGNOSIS — F3181 Bipolar II disorder: Secondary | ICD-10-CM

## 2016-11-29 ENCOUNTER — Ambulatory Visit (INDEPENDENT_AMBULATORY_CARE_PROVIDER_SITE_OTHER): Payer: Medicare Other | Admitting: Cardiovascular Disease

## 2016-11-29 ENCOUNTER — Encounter: Payer: Self-pay | Admitting: Cardiovascular Disease

## 2016-11-29 VITALS — BP 112/60 | HR 67 | Ht 60.0 in | Wt 168.0 lb

## 2016-11-29 DIAGNOSIS — I480 Paroxysmal atrial fibrillation: Secondary | ICD-10-CM | POA: Diagnosis not present

## 2016-11-29 DIAGNOSIS — M545 Low back pain: Secondary | ICD-10-CM | POA: Diagnosis not present

## 2016-11-29 DIAGNOSIS — F419 Anxiety disorder, unspecified: Secondary | ICD-10-CM

## 2016-11-29 DIAGNOSIS — Z79891 Long term (current) use of opiate analgesic: Secondary | ICD-10-CM | POA: Diagnosis not present

## 2016-11-29 DIAGNOSIS — Z79899 Other long term (current) drug therapy: Secondary | ICD-10-CM | POA: Diagnosis not present

## 2016-11-29 DIAGNOSIS — M47814 Spondylosis without myelopathy or radiculopathy, thoracic region: Secondary | ICD-10-CM | POA: Diagnosis not present

## 2016-11-29 DIAGNOSIS — G894 Chronic pain syndrome: Secondary | ICD-10-CM | POA: Diagnosis not present

## 2016-11-29 DIAGNOSIS — M47816 Spondylosis without myelopathy or radiculopathy, lumbar region: Secondary | ICD-10-CM | POA: Diagnosis not present

## 2016-11-29 MED ORDER — METOPROLOL SUCCINATE ER 50 MG PO TB24: 50.0000 mg | ORAL_TABLET | Freq: Every day | ORAL | 3 refills | Status: DC

## 2016-11-29 MED ORDER — PROPRANOLOL HCL 10 MG PO TABS: 10.0000 mg | ORAL_TABLET | Freq: Four times a day (QID) | ORAL | 11 refills | Status: DC | PRN

## 2016-11-29 NOTE — Progress Notes (Signed)
Cardiology Office Note   Date:  11/29/2016   ID:  Tameria, Patti [birth year], MRN [id number]  PCP:  Kathyrn Lass, MD  Cardiologist:   Mertie Moores, MD   Chief Complaint  Patient presents with  . Follow-up    atrial fib   Problem list 1. Paroxysmal atrial fibrillation 2. Hyperlipidemia 3.  Hypothyroidism 4.  Migraine headaches    History of Present Illness: EARLEEN AOUN is a 66 y.o. female who presents for follow up of her paroxysmal atrial fib.  Previously from  [place], [place]  Has had 2 A. fib ablations and has had a more recent cryoablation. She was on [place] until a year ago .  Has not had any recurrent atrial fibrillation since that time.  She is on verapamil for history of migraine headaches.  She cannot tell when she is in atrial fib.   Usually has a very fast HR  Gets some exercise on rare occasion.     Oct. 4, 2018:    Doing well from a cardiac standpoint. [facility] Has lots of anxiety   Past Medical History:  Diagnosis Date  . Anxiety   . Atrial fibrillation ([place])   . Bipolar 2 disorder ([place])   . Depression   . History of cardioversion    x2  . Hyperlipidemia   . Hypothyroidism   . Migraine headache   . Osteoporosis     Past Surgical History:  Procedure Laterality Date  . ABLATION    . BACK SURGERY  2014  . CARDIOVERSION  12/2013, 01/2014   x2  . KNEE SURGERY     ORTHOSCOPIC  . LUNG REMOVAL, PARTIAL Right    BENIGN LUNG CYST  . THORACIC SPINE SURGERY Right   . THUMB ARTHROSCOPY       Current Outpatient Prescriptions  Medication Sig Dispense Refill  . calcium carbonate (OS-CAL - DOSED IN MG OF ELEMENTAL CALCIUM) 1250 (500 Ca) MG tablet Take 1 tablet (500 mg of elemental calcium total) by mouth daily. For bone health    . clotrimazole (LOTRIMIN) 1 % cream Apply 1 application topically 2 (two) times daily. For yeast rash 30 g 0  . esomeprazole (NEXIUM) 40 MG capsule Take 1 capsule  (40 mg total) by mouth daily as needed. For acid reflux    . estradiol (ESTRACE) 1 MG tablet Take 1.5 tablets (1.5 mg total) by mouth daily. For hormone replacement    . ferrous sulfate 325 (65 FE) MG tablet Take 1 tablet (325 mg total) by mouth daily with breakfast. Ferrous sulfate  3  . hydrOXYzine (ATARAX/VISTARIL) 50 MG tablet Take 1 tablet (50 mg total) by mouth 3 (three) times daily as needed for anxiety. 60 tablet 0  . levothyroxine (SYNTHROID, LEVOTHROID) 112 MCG tablet Take 1 tablet (112 mcg total) by mouth daily before breakfast. For low thyroid hormone    . metoprolol succinate (TOPROL-XL) 50 MG 24 hr tablet Take 1 tablet (50 mg total) by mouth daily. Take with or immediately following a meal. 30 tablet 11  . ondansetron (ZOFRAN) 4 MG tablet Take 1 tablet (4 mg total) by mouth 3 (three) times daily as needed. As needed for nausea 1 tablet 0  . pregabalin (LYRICA) 150 MG capsule Take 1 capsule (150 mg total) by mouth 3 (three) times daily. For nerve pain    . senna-docusate (PERI-COLACE) 8.6-50 MG tablet Take 2 tablets by mouth 2 (two) times daily. For  constipation    . verapamil (VERELAN PM) 120 MG 24 hr capsule Take 1 capsule (120 mg total) by mouth at bedtime. For high blood pressure     No current facility-administered medications for this visit.     Allergies:   Codeine; Celebrex [celecoxib]; Sulfa antibiotics; Tricyclic antidepressants; Amoxicillin; Ampicillin; and Penicillins    Social History:  The patient  reports that she has never smoked. She has never used smokeless tobacco. She reports that she does not drink alcohol or use drugs.   Family History:  The patient's family history includes Atrial fibrillation in her brother.    ROS:  Please see the history of present illness.    Review of Systems: Constitutional:  denies fever, chills, diaphoresis, appetite change and fatigue.  HEENT: denies photophobia, eye pain, redness, hearing loss, ear pain, congestion, sore  throat, rhinorrhea, sneezing, neck pain, neck stiffness and tinnitus.  Respiratory: denies SOB, DOE, cough, chest tightness, and wheezing.  Cardiovascular: denies chest pain, palpitations and leg swelling.  Gastrointestinal: denies nausea, vomiting, abdominal pain, diarrhea, constipation, blood in stool.  Genitourinary: denies dysuria, urgency, frequency, hematuria, flank pain and difficulty urinating.  Musculoskeletal: denies  myalgias, back pain, joint swelling, arthralgias and gait problem.   Skin: denies pallor, rash and wound.  Neurological: denies dizziness, seizures, syncope, weakness, light-headedness, numbness and headaches.   Hematological: denies adenopathy, easy bruising, personal or family bleeding history.  Psychiatric/ Behavioral: denies suicidal ideation, mood changes, confusion, nervousness, sleep disturbance and agitation.       All other systems are reviewed and negative.   Physical Exam: There were no vitals taken for this visit.  GEN:  Well nourished, well developed in no acute distress HEENT: Normal NECK: No JVD; No carotid bruits LYMPHATICS: No lymphadenopathy CARDIAC: RRR , no murmurs, rubs, gallops RESPIRATORY:  Clear to auscultation without rales, wheezing or rhonchi  ABDOMEN: Soft, non-tender, non-distended MUSCULOSKELETAL:  No edema; No deformity  SKIN: Warm and dry NEUROLOGIC:  Alert and oriented x 3    EKG:  EKG is ordered today.  Oct. 4, 2018 - NSR at 21.  Normal.     Recent Labs: 07/26/2016: ALT 16; BUN 13; Creatinine, Ser 0.95; Hemoglobin 13.6; Platelets 240; Potassium 3.7; Sodium 138 07/27/2016: TSH 5.320    Lipid Panel    Component Value Date/Time   CHOL 224 (H) 07/27/2016 0603   TRIG 92 07/27/2016 0603   HDL 118 07/27/2016 0603   CHOLHDL 1.9 07/27/2016 0603   VLDL 18 07/27/2016 0603   LDLCALC 88 07/27/2016 0603      Wt Readings from Last 3 Encounters:  07/26/16 178 lb (80.7 kg)  11/14/15 171 lb 1.9 oz (77.6 kg)      Other  studies Reviewed: Additional studies/ records that were reviewed today include: . Review of the above records demonstrates:    ASSESSMENT AND PLAN:  1.  Paroxysmal atrial fib:   Has had 3 a-fib ablations  ( 2 regular ablations and 1 cryo- ablation)  CHADS2VASC = 2  ( female, 67)   We had a long discussion about starting [place].  She does not want to start oral anticoagulation. She complains about the cost of the newer oral anticoagulates. She refuses to wear a 30 day event monitor because of the adhesive allergy.   She realizes that she is at increased risk for stroke by not taking oral anti-coagulation but at this point except that risk. We will continue with aspirin. I've advised her to come to the office or  to the hospital if she has any prolonged episodes of palpitations. We will need to get an EKG at that time.  She has occasional episodes of palpitations associated with anxiety. We will start her on propranolol 10 mg to be taken up to 4 times a day as needed. She has been seeing Dr. Lynder Parents who agrees with this therapy for her anxiety as well.   Current medicines are reviewed at length with the patient today.  The patient does not have concerns regarding medicines.  Labs/ tests ordered today include:  No orders of the defined types were placed in this encounter.    Disposition:   FU with  Me in 1 year.     Mertie Moores, MD  11/29/2016 9:53 AM    [address], [place], [place]  [address] Phone: [phone]; Fax: [phone]   [facility]  [address] [place],   [address] [phone]   Fax [phone]

## 2016-11-29 NOTE — Patient Instructions (Addendum)
Medication Instructions:  START Propranolol 10 mg up to 4 times per day as needed for palpitations/fast heart rate **Call Sharyn Lull to report how you are doing with taking the Propranolol as needed   Labwork: None Ordered   Testing/Procedures: None Ordered   Follow-Up: Your physician wants you to follow-up in: 1 year with Dr. Acie Fredrickson.  You will receive a reminder letter in the mail two months in advance. If you don't receive a letter, please call our office to schedule the follow-up appointment.   If you need a refill on your cardiac medications before your next appointment, please call your pharmacy.   Thank you for choosing [facility]! Christen Bame, RN [phone]

## 2016-12-03 DIAGNOSIS — M1811 Unilateral primary osteoarthritis of first carpometacarpal joint, right hand: Secondary | ICD-10-CM | POA: Diagnosis not present

## 2016-12-11 DIAGNOSIS — G518 Other disorders of facial nerve: Secondary | ICD-10-CM | POA: Diagnosis not present

## 2016-12-11 DIAGNOSIS — M542 Cervicalgia: Secondary | ICD-10-CM | POA: Diagnosis not present

## 2016-12-11 DIAGNOSIS — M791 Myalgia, unspecified site: Secondary | ICD-10-CM | POA: Diagnosis not present

## 2016-12-11 DIAGNOSIS — R51 Headache: Secondary | ICD-10-CM | POA: Diagnosis not present

## 2016-12-11 DIAGNOSIS — G43719 Chronic migraine without aura, intractable, without status migrainosus: Secondary | ICD-10-CM | POA: Diagnosis not present

## 2016-12-12 ENCOUNTER — Ambulatory Visit: Payer: Self-pay | Admitting: Psychology

## 2016-12-13 ENCOUNTER — Ambulatory Visit (INDEPENDENT_AMBULATORY_CARE_PROVIDER_SITE_OTHER): Payer: Medicare Other | Admitting: Psychology

## 2016-12-13 DIAGNOSIS — F3181 Bipolar II disorder: Secondary | ICD-10-CM

## 2016-12-14 ENCOUNTER — Telehealth: Payer: Self-pay | Admitting: Cardiovascular Disease

## 2016-12-14 NOTE — Telephone Encounter (Signed)
Noted. Forwarding to Dr. Acie Fredrickson for his awareness.

## 2016-12-14 NOTE — Telephone Encounter (Signed)
New message    Patient states Dr Acie Fredrickson wanted to know how she was doing on medication. Patient  reports she is doing well using propranolol (INDERAL) 10 MG tablet.    1. Name of Medication: propranolol (INDERAL) 10 MG tablet  2. How are you currently taking this medication (dosage and times per day)? Take 1 tablet (10 mg total) by mouth 4 (four) times daily as needed.  3. Are you having a reaction (difficulty breathing--STAT)? NO  4. What is your medication issue? No issue per patient

## 2016-12-16 NOTE — Telephone Encounter (Signed)
I glad to hear that she is doing well on the Propranolol

## 2016-12-26 ENCOUNTER — Ambulatory Visit: Payer: Self-pay | Admitting: Psychology

## 2016-12-26 ENCOUNTER — Ambulatory Visit (INDEPENDENT_AMBULATORY_CARE_PROVIDER_SITE_OTHER): Payer: Medicare Other | Admitting: Psychology

## 2016-12-26 DIAGNOSIS — F3181 Bipolar II disorder: Secondary | ICD-10-CM

## 2016-12-27 DIAGNOSIS — M47814 Spondylosis without myelopathy or radiculopathy, thoracic region: Secondary | ICD-10-CM | POA: Diagnosis not present

## 2016-12-27 DIAGNOSIS — Z79891 Long term (current) use of opiate analgesic: Secondary | ICD-10-CM | POA: Diagnosis not present

## 2016-12-27 DIAGNOSIS — Z79899 Other long term (current) drug therapy: Secondary | ICD-10-CM | POA: Diagnosis not present

## 2016-12-27 DIAGNOSIS — G5600 Carpal tunnel syndrome, unspecified upper limb: Secondary | ICD-10-CM | POA: Diagnosis not present

## 2016-12-27 DIAGNOSIS — G894 Chronic pain syndrome: Secondary | ICD-10-CM | POA: Diagnosis not present

## 2016-12-27 DIAGNOSIS — M47816 Spondylosis without myelopathy or radiculopathy, lumbar region: Secondary | ICD-10-CM | POA: Diagnosis not present

## 2017-01-01 DIAGNOSIS — R197 Diarrhea, unspecified: Secondary | ICD-10-CM | POA: Diagnosis not present

## 2017-01-09 ENCOUNTER — Ambulatory Visit (INDEPENDENT_AMBULATORY_CARE_PROVIDER_SITE_OTHER): Payer: Medicare Other | Admitting: Psychology

## 2017-01-09 DIAGNOSIS — F3181 Bipolar II disorder: Secondary | ICD-10-CM | POA: Diagnosis not present

## 2017-01-22 DIAGNOSIS — F315 Bipolar disorder, current episode depressed, severe, with psychotic features: Secondary | ICD-10-CM | POA: Diagnosis not present

## 2017-01-23 ENCOUNTER — Ambulatory Visit (INDEPENDENT_AMBULATORY_CARE_PROVIDER_SITE_OTHER): Payer: Medicare Other | Admitting: Psychology

## 2017-01-23 DIAGNOSIS — F3181 Bipolar II disorder: Secondary | ICD-10-CM

## 2017-01-28 DIAGNOSIS — M791 Myalgia, unspecified site: Secondary | ICD-10-CM | POA: Diagnosis not present

## 2017-01-28 DIAGNOSIS — G43719 Chronic migraine without aura, intractable, without status migrainosus: Secondary | ICD-10-CM | POA: Diagnosis not present

## 2017-01-28 DIAGNOSIS — G518 Other disorders of facial nerve: Secondary | ICD-10-CM | POA: Diagnosis not present

## 2017-01-28 DIAGNOSIS — M542 Cervicalgia: Secondary | ICD-10-CM | POA: Diagnosis not present

## 2017-01-28 DIAGNOSIS — R51 Headache: Secondary | ICD-10-CM | POA: Diagnosis not present

## 2017-01-30 DIAGNOSIS — Z79899 Other long term (current) drug therapy: Secondary | ICD-10-CM | POA: Diagnosis not present

## 2017-01-30 DIAGNOSIS — M47816 Spondylosis without myelopathy or radiculopathy, lumbar region: Secondary | ICD-10-CM | POA: Diagnosis not present

## 2017-01-30 DIAGNOSIS — M47814 Spondylosis without myelopathy or radiculopathy, thoracic region: Secondary | ICD-10-CM | POA: Diagnosis not present

## 2017-01-30 DIAGNOSIS — G894 Chronic pain syndrome: Secondary | ICD-10-CM | POA: Diagnosis not present

## 2017-01-30 DIAGNOSIS — G5600 Carpal tunnel syndrome, unspecified upper limb: Secondary | ICD-10-CM | POA: Diagnosis not present

## 2017-01-30 DIAGNOSIS — Z79891 Long term (current) use of opiate analgesic: Secondary | ICD-10-CM | POA: Diagnosis not present

## 2017-02-06 ENCOUNTER — Ambulatory Visit (INDEPENDENT_AMBULATORY_CARE_PROVIDER_SITE_OTHER): Payer: Medicare Other | Admitting: Psychology

## 2017-02-06 DIAGNOSIS — F3181 Bipolar II disorder: Secondary | ICD-10-CM

## 2017-02-20 ENCOUNTER — Ambulatory Visit (INDEPENDENT_AMBULATORY_CARE_PROVIDER_SITE_OTHER): Payer: Medicare Other | Admitting: Psychology

## 2017-02-20 DIAGNOSIS — F3181 Bipolar II disorder: Secondary | ICD-10-CM

## 2017-03-04 DIAGNOSIS — G894 Chronic pain syndrome: Secondary | ICD-10-CM | POA: Diagnosis not present

## 2017-03-04 DIAGNOSIS — Z79891 Long term (current) use of opiate analgesic: Secondary | ICD-10-CM | POA: Diagnosis not present

## 2017-03-04 DIAGNOSIS — M47816 Spondylosis without myelopathy or radiculopathy, lumbar region: Secondary | ICD-10-CM | POA: Diagnosis not present

## 2017-03-04 DIAGNOSIS — G5600 Carpal tunnel syndrome, unspecified upper limb: Secondary | ICD-10-CM | POA: Diagnosis not present

## 2017-03-04 DIAGNOSIS — Z79899 Other long term (current) drug therapy: Secondary | ICD-10-CM | POA: Diagnosis not present

## 2017-03-04 DIAGNOSIS — M47814 Spondylosis without myelopathy or radiculopathy, thoracic region: Secondary | ICD-10-CM | POA: Diagnosis not present

## 2017-03-06 ENCOUNTER — Ambulatory Visit (INDEPENDENT_AMBULATORY_CARE_PROVIDER_SITE_OTHER): Payer: Medicare Other | Admitting: Psychology

## 2017-03-06 DIAGNOSIS — F3181 Bipolar II disorder: Secondary | ICD-10-CM

## 2017-03-12 DIAGNOSIS — R51 Headache: Secondary | ICD-10-CM | POA: Diagnosis not present

## 2017-03-12 DIAGNOSIS — M791 Myalgia, unspecified site: Secondary | ICD-10-CM | POA: Diagnosis not present

## 2017-03-12 DIAGNOSIS — G518 Other disorders of facial nerve: Secondary | ICD-10-CM | POA: Diagnosis not present

## 2017-03-12 DIAGNOSIS — G43719 Chronic migraine without aura, intractable, without status migrainosus: Secondary | ICD-10-CM | POA: Diagnosis not present

## 2017-03-12 DIAGNOSIS — M542 Cervicalgia: Secondary | ICD-10-CM | POA: Diagnosis not present

## 2017-03-18 DIAGNOSIS — M47816 Spondylosis without myelopathy or radiculopathy, lumbar region: Secondary | ICD-10-CM | POA: Diagnosis not present

## 2017-03-19 DIAGNOSIS — Z79891 Long term (current) use of opiate analgesic: Secondary | ICD-10-CM | POA: Diagnosis not present

## 2017-03-19 DIAGNOSIS — Z79899 Other long term (current) drug therapy: Secondary | ICD-10-CM | POA: Diagnosis not present

## 2017-03-19 DIAGNOSIS — G894 Chronic pain syndrome: Secondary | ICD-10-CM | POA: Diagnosis not present

## 2017-03-20 ENCOUNTER — Ambulatory Visit (INDEPENDENT_AMBULATORY_CARE_PROVIDER_SITE_OTHER): Payer: Medicare Other | Admitting: Psychology

## 2017-03-20 DIAGNOSIS — F3181 Bipolar II disorder: Secondary | ICD-10-CM | POA: Diagnosis not present

## 2017-03-21 DIAGNOSIS — M1811 Unilateral primary osteoarthritis of first carpometacarpal joint, right hand: Secondary | ICD-10-CM | POA: Diagnosis not present

## 2017-04-03 ENCOUNTER — Ambulatory Visit (INDEPENDENT_AMBULATORY_CARE_PROVIDER_SITE_OTHER): Payer: Medicare Other | Admitting: Psychology

## 2017-04-03 DIAGNOSIS — M47816 Spondylosis without myelopathy or radiculopathy, lumbar region: Secondary | ICD-10-CM | POA: Diagnosis not present

## 2017-04-03 DIAGNOSIS — G5600 Carpal tunnel syndrome, unspecified upper limb: Secondary | ICD-10-CM | POA: Diagnosis not present

## 2017-04-03 DIAGNOSIS — F3181 Bipolar II disorder: Secondary | ICD-10-CM | POA: Diagnosis not present

## 2017-04-03 DIAGNOSIS — G894 Chronic pain syndrome: Secondary | ICD-10-CM | POA: Diagnosis not present

## 2017-04-03 DIAGNOSIS — Z79899 Other long term (current) drug therapy: Secondary | ICD-10-CM | POA: Diagnosis not present

## 2017-04-03 DIAGNOSIS — M169 Osteoarthritis of hip, unspecified: Secondary | ICD-10-CM | POA: Diagnosis not present

## 2017-04-03 DIAGNOSIS — Z79891 Long term (current) use of opiate analgesic: Secondary | ICD-10-CM | POA: Diagnosis not present

## 2017-04-08 ENCOUNTER — Ambulatory Visit: Payer: Medicare Other | Admitting: Psychology

## 2017-04-11 DIAGNOSIS — M169 Osteoarthritis of hip, unspecified: Secondary | ICD-10-CM | POA: Diagnosis not present

## 2017-04-16 DIAGNOSIS — F315 Bipolar disorder, current episode depressed, severe, with psychotic features: Secondary | ICD-10-CM | POA: Diagnosis not present

## 2017-04-17 ENCOUNTER — Ambulatory Visit: Payer: Self-pay | Admitting: Psychology

## 2017-04-19 ENCOUNTER — Ambulatory Visit (INDEPENDENT_AMBULATORY_CARE_PROVIDER_SITE_OTHER): Payer: Medicare Other | Admitting: Psychology

## 2017-04-19 DIAGNOSIS — F3181 Bipolar II disorder: Secondary | ICD-10-CM | POA: Diagnosis not present

## 2017-04-29 DIAGNOSIS — R51 Headache: Secondary | ICD-10-CM | POA: Diagnosis not present

## 2017-04-29 DIAGNOSIS — M791 Myalgia, unspecified site: Secondary | ICD-10-CM | POA: Diagnosis not present

## 2017-04-29 DIAGNOSIS — M542 Cervicalgia: Secondary | ICD-10-CM | POA: Diagnosis not present

## 2017-04-29 DIAGNOSIS — G518 Other disorders of facial nerve: Secondary | ICD-10-CM | POA: Diagnosis not present

## 2017-04-29 DIAGNOSIS — G43719 Chronic migraine without aura, intractable, without status migrainosus: Secondary | ICD-10-CM | POA: Diagnosis not present

## 2017-05-01 ENCOUNTER — Ambulatory Visit (INDEPENDENT_AMBULATORY_CARE_PROVIDER_SITE_OTHER): Payer: Medicare Other | Admitting: Psychology

## 2017-05-01 DIAGNOSIS — G5600 Carpal tunnel syndrome, unspecified upper limb: Secondary | ICD-10-CM | POA: Diagnosis not present

## 2017-05-01 DIAGNOSIS — F3181 Bipolar II disorder: Secondary | ICD-10-CM

## 2017-05-01 DIAGNOSIS — M47816 Spondylosis without myelopathy or radiculopathy, lumbar region: Secondary | ICD-10-CM | POA: Diagnosis not present

## 2017-05-01 DIAGNOSIS — G894 Chronic pain syndrome: Secondary | ICD-10-CM | POA: Diagnosis not present

## 2017-05-01 DIAGNOSIS — M169 Osteoarthritis of hip, unspecified: Secondary | ICD-10-CM | POA: Diagnosis not present

## 2017-05-06 DIAGNOSIS — F315 Bipolar disorder, current episode depressed, severe, with psychotic features: Secondary | ICD-10-CM | POA: Diagnosis not present

## 2017-05-08 DIAGNOSIS — G8918 Other acute postprocedural pain: Secondary | ICD-10-CM | POA: Diagnosis not present

## 2017-05-08 DIAGNOSIS — M1811 Unilateral primary osteoarthritis of first carpometacarpal joint, right hand: Secondary | ICD-10-CM | POA: Diagnosis not present

## 2017-05-15 ENCOUNTER — Ambulatory Visit (INDEPENDENT_AMBULATORY_CARE_PROVIDER_SITE_OTHER): Payer: Medicare Other | Admitting: Psychology

## 2017-05-15 DIAGNOSIS — F3181 Bipolar II disorder: Secondary | ICD-10-CM | POA: Diagnosis not present

## 2017-05-21 DIAGNOSIS — Z4889 Encounter for other specified surgical aftercare: Secondary | ICD-10-CM | POA: Diagnosis not present

## 2017-05-21 DIAGNOSIS — M1811 Unilateral primary osteoarthritis of first carpometacarpal joint, right hand: Secondary | ICD-10-CM | POA: Diagnosis not present

## 2017-05-21 DIAGNOSIS — M189 Osteoarthritis of first carpometacarpal joint, unspecified: Secondary | ICD-10-CM | POA: Insufficient documentation

## 2017-05-27 DIAGNOSIS — I480 Paroxysmal atrial fibrillation: Secondary | ICD-10-CM | POA: Diagnosis not present

## 2017-05-27 DIAGNOSIS — M8580 Other specified disorders of bone density and structure, unspecified site: Secondary | ICD-10-CM | POA: Diagnosis not present

## 2017-05-27 DIAGNOSIS — F323 Major depressive disorder, single episode, severe with psychotic features: Secondary | ICD-10-CM | POA: Diagnosis not present

## 2017-05-27 DIAGNOSIS — E039 Hypothyroidism, unspecified: Secondary | ICD-10-CM | POA: Diagnosis not present

## 2017-05-27 DIAGNOSIS — N959 Unspecified menopausal and perimenopausal disorder: Secondary | ICD-10-CM | POA: Diagnosis not present

## 2017-05-27 DIAGNOSIS — E6609 Other obesity due to excess calories: Secondary | ICD-10-CM | POA: Diagnosis not present

## 2017-05-27 DIAGNOSIS — E7800 Pure hypercholesterolemia, unspecified: Secondary | ICD-10-CM | POA: Diagnosis not present

## 2017-05-28 ENCOUNTER — Other Ambulatory Visit: Payer: Self-pay | Admitting: Family Medicine

## 2017-05-28 DIAGNOSIS — Z79899 Other long term (current) drug therapy: Secondary | ICD-10-CM | POA: Diagnosis not present

## 2017-05-28 DIAGNOSIS — M169 Osteoarthritis of hip, unspecified: Secondary | ICD-10-CM | POA: Diagnosis not present

## 2017-05-28 DIAGNOSIS — Z139 Encounter for screening, unspecified: Secondary | ICD-10-CM

## 2017-05-28 DIAGNOSIS — M25559 Pain in unspecified hip: Secondary | ICD-10-CM | POA: Diagnosis not present

## 2017-05-28 DIAGNOSIS — Z79891 Long term (current) use of opiate analgesic: Secondary | ICD-10-CM | POA: Diagnosis not present

## 2017-05-28 DIAGNOSIS — M8580 Other specified disorders of bone density and structure, unspecified site: Secondary | ICD-10-CM

## 2017-05-28 DIAGNOSIS — G894 Chronic pain syndrome: Secondary | ICD-10-CM | POA: Diagnosis not present

## 2017-05-28 DIAGNOSIS — M47816 Spondylosis without myelopathy or radiculopathy, lumbar region: Secondary | ICD-10-CM | POA: Diagnosis not present

## 2017-05-29 ENCOUNTER — Ambulatory Visit: Payer: Medicare Other | Admitting: Psychology

## 2017-06-05 DIAGNOSIS — F315 Bipolar disorder, current episode depressed, severe, with psychotic features: Secondary | ICD-10-CM | POA: Diagnosis not present

## 2017-06-10 ENCOUNTER — Ambulatory Visit (INDEPENDENT_AMBULATORY_CARE_PROVIDER_SITE_OTHER): Payer: Medicare Other | Admitting: Psychology

## 2017-06-10 DIAGNOSIS — F3181 Bipolar II disorder: Secondary | ICD-10-CM | POA: Diagnosis not present

## 2017-06-12 ENCOUNTER — Ambulatory Visit: Payer: Medicare Other | Admitting: Psychology

## 2017-06-13 DIAGNOSIS — M791 Myalgia, unspecified site: Secondary | ICD-10-CM | POA: Diagnosis not present

## 2017-06-13 DIAGNOSIS — R51 Headache: Secondary | ICD-10-CM | POA: Diagnosis not present

## 2017-06-13 DIAGNOSIS — M542 Cervicalgia: Secondary | ICD-10-CM | POA: Diagnosis not present

## 2017-06-13 DIAGNOSIS — G518 Other disorders of facial nerve: Secondary | ICD-10-CM | POA: Diagnosis not present

## 2017-06-13 DIAGNOSIS — G43719 Chronic migraine without aura, intractable, without status migrainosus: Secondary | ICD-10-CM | POA: Diagnosis not present

## 2017-06-18 DIAGNOSIS — M25641 Stiffness of right hand, not elsewhere classified: Secondary | ICD-10-CM | POA: Insufficient documentation

## 2017-06-18 DIAGNOSIS — Z4889 Encounter for other specified surgical aftercare: Secondary | ICD-10-CM | POA: Diagnosis not present

## 2017-06-18 DIAGNOSIS — M1811 Unilateral primary osteoarthritis of first carpometacarpal joint, right hand: Secondary | ICD-10-CM | POA: Diagnosis not present

## 2017-06-25 DIAGNOSIS — M25641 Stiffness of right hand, not elsewhere classified: Secondary | ICD-10-CM | POA: Diagnosis not present

## 2017-06-26 ENCOUNTER — Ambulatory Visit (INDEPENDENT_AMBULATORY_CARE_PROVIDER_SITE_OTHER): Payer: Medicare Other | Admitting: Psychology

## 2017-06-26 DIAGNOSIS — F3181 Bipolar II disorder: Secondary | ICD-10-CM

## 2017-07-01 DIAGNOSIS — M25641 Stiffness of right hand, not elsewhere classified: Secondary | ICD-10-CM | POA: Diagnosis not present

## 2017-07-02 DIAGNOSIS — Z79891 Long term (current) use of opiate analgesic: Secondary | ICD-10-CM | POA: Diagnosis not present

## 2017-07-02 DIAGNOSIS — G894 Chronic pain syndrome: Secondary | ICD-10-CM | POA: Diagnosis not present

## 2017-07-02 DIAGNOSIS — M47816 Spondylosis without myelopathy or radiculopathy, lumbar region: Secondary | ICD-10-CM | POA: Diagnosis not present

## 2017-07-02 DIAGNOSIS — G5600 Carpal tunnel syndrome, unspecified upper limb: Secondary | ICD-10-CM | POA: Diagnosis not present

## 2017-07-02 DIAGNOSIS — Z79899 Other long term (current) drug therapy: Secondary | ICD-10-CM | POA: Diagnosis not present

## 2017-07-02 DIAGNOSIS — M169 Osteoarthritis of hip, unspecified: Secondary | ICD-10-CM | POA: Diagnosis not present

## 2017-07-08 DIAGNOSIS — M25641 Stiffness of right hand, not elsewhere classified: Secondary | ICD-10-CM | POA: Diagnosis not present

## 2017-07-10 ENCOUNTER — Ambulatory Visit (INDEPENDENT_AMBULATORY_CARE_PROVIDER_SITE_OTHER): Payer: Medicare Other | Admitting: Psychology

## 2017-07-10 DIAGNOSIS — F3181 Bipolar II disorder: Secondary | ICD-10-CM | POA: Diagnosis not present

## 2017-07-15 ENCOUNTER — Ambulatory Visit
Admission: RE | Admit: 2017-07-15 | Discharge: 2017-07-15 | Disposition: A | Discharge status: Home / Self Care | Payer: PRIVATE HEALTH INSURANCE | Source: Ambulatory Visit | Attending: Family Medicine | Admitting: Family Medicine

## 2017-07-15 DIAGNOSIS — M25641 Stiffness of right hand, not elsewhere classified: Secondary | ICD-10-CM | POA: Diagnosis not present

## 2017-07-15 DIAGNOSIS — M85852 Other specified disorders of bone density and structure, left thigh: Secondary | ICD-10-CM | POA: Diagnosis not present

## 2017-07-15 DIAGNOSIS — Z1231 Encounter for screening mammogram for malignant neoplasm of breast: Secondary | ICD-10-CM | POA: Diagnosis not present

## 2017-07-15 DIAGNOSIS — M8580 Other specified disorders of bone density and structure, unspecified site: Secondary | ICD-10-CM

## 2017-07-15 DIAGNOSIS — Z139 Encounter for screening, unspecified: Secondary | ICD-10-CM

## 2017-07-15 DIAGNOSIS — Z780 Asymptomatic menopausal state: Secondary | ICD-10-CM | POA: Diagnosis not present

## 2017-07-16 DIAGNOSIS — M1811 Unilateral primary osteoarthritis of first carpometacarpal joint, right hand: Secondary | ICD-10-CM | POA: Diagnosis not present

## 2017-07-16 DIAGNOSIS — Z4789 Encounter for other orthopedic aftercare: Secondary | ICD-10-CM | POA: Diagnosis not present

## 2017-07-17 DIAGNOSIS — F315 Bipolar disorder, current episode depressed, severe, with psychotic features: Secondary | ICD-10-CM | POA: Diagnosis not present

## 2017-07-24 ENCOUNTER — Ambulatory Visit (INDEPENDENT_AMBULATORY_CARE_PROVIDER_SITE_OTHER): Payer: Medicare Other | Admitting: Psychology

## 2017-07-24 DIAGNOSIS — F3181 Bipolar II disorder: Secondary | ICD-10-CM | POA: Diagnosis not present

## 2017-07-24 DIAGNOSIS — M25641 Stiffness of right hand, not elsewhere classified: Secondary | ICD-10-CM | POA: Diagnosis not present

## 2017-07-30 DIAGNOSIS — M25641 Stiffness of right hand, not elsewhere classified: Secondary | ICD-10-CM | POA: Diagnosis not present

## 2017-07-31 DIAGNOSIS — G43719 Chronic migraine without aura, intractable, without status migrainosus: Secondary | ICD-10-CM | POA: Diagnosis not present

## 2017-07-31 DIAGNOSIS — E559 Vitamin D deficiency, unspecified: Secondary | ICD-10-CM | POA: Diagnosis not present

## 2017-07-31 DIAGNOSIS — M791 Myalgia, unspecified site: Secondary | ICD-10-CM | POA: Diagnosis not present

## 2017-07-31 DIAGNOSIS — G518 Other disorders of facial nerve: Secondary | ICD-10-CM | POA: Diagnosis not present

## 2017-07-31 DIAGNOSIS — R51 Headache: Secondary | ICD-10-CM | POA: Diagnosis not present

## 2017-07-31 DIAGNOSIS — M542 Cervicalgia: Secondary | ICD-10-CM | POA: Diagnosis not present

## 2017-08-01 DIAGNOSIS — M169 Osteoarthritis of hip, unspecified: Secondary | ICD-10-CM | POA: Diagnosis not present

## 2017-08-06 DIAGNOSIS — M25641 Stiffness of right hand, not elsewhere classified: Secondary | ICD-10-CM | POA: Diagnosis not present

## 2017-08-07 ENCOUNTER — Ambulatory Visit: Payer: Medicare Other | Admitting: Psychology

## 2017-08-08 DIAGNOSIS — G894 Chronic pain syndrome: Secondary | ICD-10-CM | POA: Diagnosis not present

## 2017-08-08 DIAGNOSIS — G5600 Carpal tunnel syndrome, unspecified upper limb: Secondary | ICD-10-CM | POA: Diagnosis not present

## 2017-08-08 DIAGNOSIS — M47816 Spondylosis without myelopathy or radiculopathy, lumbar region: Secondary | ICD-10-CM | POA: Diagnosis not present

## 2017-08-08 DIAGNOSIS — M169 Osteoarthritis of hip, unspecified: Secondary | ICD-10-CM | POA: Diagnosis not present

## 2017-08-09 DIAGNOSIS — Z4802 Encounter for removal of sutures: Secondary | ICD-10-CM | POA: Diagnosis not present

## 2017-08-12 DIAGNOSIS — M25641 Stiffness of right hand, not elsewhere classified: Secondary | ICD-10-CM | POA: Diagnosis not present

## 2017-08-12 DIAGNOSIS — M47817 Spondylosis without myelopathy or radiculopathy, lumbosacral region: Secondary | ICD-10-CM | POA: Diagnosis not present

## 2017-08-12 DIAGNOSIS — M47816 Spondylosis without myelopathy or radiculopathy, lumbar region: Secondary | ICD-10-CM | POA: Diagnosis not present

## 2017-08-13 DIAGNOSIS — M1811 Unilateral primary osteoarthritis of first carpometacarpal joint, right hand: Secondary | ICD-10-CM | POA: Diagnosis not present

## 2017-08-13 DIAGNOSIS — M25641 Stiffness of right hand, not elsewhere classified: Secondary | ICD-10-CM | POA: Diagnosis not present

## 2017-08-15 ENCOUNTER — Ambulatory Visit (INDEPENDENT_AMBULATORY_CARE_PROVIDER_SITE_OTHER): Payer: Medicare Other | Admitting: Psychology

## 2017-08-15 DIAGNOSIS — F3181 Bipolar II disorder: Secondary | ICD-10-CM

## 2017-08-20 DIAGNOSIS — F315 Bipolar disorder, current episode depressed, severe, with psychotic features: Secondary | ICD-10-CM | POA: Diagnosis not present

## 2017-08-21 ENCOUNTER — Ambulatory Visit (INDEPENDENT_AMBULATORY_CARE_PROVIDER_SITE_OTHER): Payer: Medicare Other | Admitting: Psychology

## 2017-08-21 DIAGNOSIS — F3181 Bipolar II disorder: Secondary | ICD-10-CM

## 2017-09-04 ENCOUNTER — Ambulatory Visit (INDEPENDENT_AMBULATORY_CARE_PROVIDER_SITE_OTHER): Payer: Medicare Other | Admitting: Psychology

## 2017-09-04 DIAGNOSIS — F3181 Bipolar II disorder: Secondary | ICD-10-CM

## 2017-09-06 DIAGNOSIS — Z79899 Other long term (current) drug therapy: Secondary | ICD-10-CM | POA: Diagnosis not present

## 2017-09-06 DIAGNOSIS — M47816 Spondylosis without myelopathy or radiculopathy, lumbar region: Secondary | ICD-10-CM | POA: Diagnosis not present

## 2017-09-06 DIAGNOSIS — M169 Osteoarthritis of hip, unspecified: Secondary | ICD-10-CM | POA: Diagnosis not present

## 2017-09-06 DIAGNOSIS — G894 Chronic pain syndrome: Secondary | ICD-10-CM | POA: Diagnosis not present

## 2017-09-06 DIAGNOSIS — Z79891 Long term (current) use of opiate analgesic: Secondary | ICD-10-CM | POA: Diagnosis not present

## 2017-09-09 DIAGNOSIS — G518 Other disorders of facial nerve: Secondary | ICD-10-CM | POA: Diagnosis not present

## 2017-09-09 DIAGNOSIS — M791 Myalgia, unspecified site: Secondary | ICD-10-CM | POA: Diagnosis not present

## 2017-09-09 DIAGNOSIS — M542 Cervicalgia: Secondary | ICD-10-CM | POA: Diagnosis not present

## 2017-09-09 DIAGNOSIS — R51 Headache: Secondary | ICD-10-CM | POA: Diagnosis not present

## 2017-09-09 DIAGNOSIS — G43719 Chronic migraine without aura, intractable, without status migrainosus: Secondary | ICD-10-CM | POA: Diagnosis not present

## 2017-09-18 ENCOUNTER — Ambulatory Visit: Payer: Medicare Other | Admitting: Psychology

## 2017-09-19 DIAGNOSIS — F315 Bipolar disorder, current episode depressed, severe, with psychotic features: Secondary | ICD-10-CM | POA: Diagnosis not present

## 2017-10-02 ENCOUNTER — Ambulatory Visit (INDEPENDENT_AMBULATORY_CARE_PROVIDER_SITE_OTHER): Payer: Medicare Other | Admitting: Psychology

## 2017-10-02 DIAGNOSIS — F3181 Bipolar II disorder: Secondary | ICD-10-CM

## 2017-10-07 DIAGNOSIS — K219 Gastro-esophageal reflux disease without esophagitis: Secondary | ICD-10-CM | POA: Diagnosis not present

## 2017-10-09 DIAGNOSIS — M47816 Spondylosis without myelopathy or radiculopathy, lumbar region: Secondary | ICD-10-CM | POA: Diagnosis not present

## 2017-10-09 DIAGNOSIS — M169 Osteoarthritis of hip, unspecified: Secondary | ICD-10-CM | POA: Diagnosis not present

## 2017-10-09 DIAGNOSIS — G894 Chronic pain syndrome: Secondary | ICD-10-CM | POA: Diagnosis not present

## 2017-10-16 ENCOUNTER — Ambulatory Visit (INDEPENDENT_AMBULATORY_CARE_PROVIDER_SITE_OTHER): Payer: Medicare Other | Admitting: Psychology

## 2017-10-16 DIAGNOSIS — F3181 Bipolar II disorder: Secondary | ICD-10-CM | POA: Diagnosis not present

## 2017-10-22 DIAGNOSIS — M791 Myalgia, unspecified site: Secondary | ICD-10-CM | POA: Diagnosis not present

## 2017-10-22 DIAGNOSIS — G518 Other disorders of facial nerve: Secondary | ICD-10-CM | POA: Diagnosis not present

## 2017-10-22 DIAGNOSIS — M542 Cervicalgia: Secondary | ICD-10-CM | POA: Diagnosis not present

## 2017-10-22 DIAGNOSIS — R51 Headache: Secondary | ICD-10-CM | POA: Diagnosis not present

## 2017-10-22 DIAGNOSIS — G43719 Chronic migraine without aura, intractable, without status migrainosus: Secondary | ICD-10-CM | POA: Diagnosis not present

## 2017-10-29 ENCOUNTER — Ambulatory Visit (INDEPENDENT_AMBULATORY_CARE_PROVIDER_SITE_OTHER): Payer: Medicare Other | Admitting: Psychology

## 2017-10-29 DIAGNOSIS — F3181 Bipolar II disorder: Secondary | ICD-10-CM

## 2017-11-06 DIAGNOSIS — M169 Osteoarthritis of hip, unspecified: Secondary | ICD-10-CM | POA: Diagnosis not present

## 2017-11-06 DIAGNOSIS — Z79891 Long term (current) use of opiate analgesic: Secondary | ICD-10-CM | POA: Diagnosis not present

## 2017-11-06 DIAGNOSIS — G894 Chronic pain syndrome: Secondary | ICD-10-CM | POA: Diagnosis not present

## 2017-11-06 DIAGNOSIS — Z79899 Other long term (current) drug therapy: Secondary | ICD-10-CM | POA: Diagnosis not present

## 2017-11-06 DIAGNOSIS — M47816 Spondylosis without myelopathy or radiculopathy, lumbar region: Secondary | ICD-10-CM | POA: Diagnosis not present

## 2017-11-06 DIAGNOSIS — G5600 Carpal tunnel syndrome, unspecified upper limb: Secondary | ICD-10-CM | POA: Diagnosis not present

## 2017-11-08 DIAGNOSIS — Z23 Encounter for immunization: Secondary | ICD-10-CM | POA: Diagnosis not present

## 2017-11-13 ENCOUNTER — Ambulatory Visit (INDEPENDENT_AMBULATORY_CARE_PROVIDER_SITE_OTHER): Payer: Medicare Other | Admitting: Psychology

## 2017-11-13 DIAGNOSIS — F3181 Bipolar II disorder: Secondary | ICD-10-CM

## 2017-11-15 DIAGNOSIS — M1811 Unilateral primary osteoarthritis of first carpometacarpal joint, right hand: Secondary | ICD-10-CM | POA: Diagnosis not present

## 2017-11-24 ENCOUNTER — Encounter (HOSPITAL_COMMUNITY): Payer: Self-pay

## 2017-11-24 ENCOUNTER — Emergency Department (HOSPITAL_COMMUNITY): Payer: Medicare Other

## 2017-11-24 ENCOUNTER — Other Ambulatory Visit: Payer: Self-pay

## 2017-11-24 ENCOUNTER — Inpatient Hospital Stay (HOSPITAL_COMMUNITY)
Admission: EM | Admit: 2017-11-24 | Discharge: 2017-11-26 | DRG: 071 | Disposition: A | Discharge status: Home / Self Care | Payer: Medicare Other | Attending: Family Medicine | Admitting: Family Medicine

## 2017-11-24 DIAGNOSIS — Z885 Allergy status to narcotic agent status: Secondary | ICD-10-CM

## 2017-11-24 DIAGNOSIS — D72829 Elevated white blood cell count, unspecified: Secondary | ICD-10-CM | POA: Diagnosis present

## 2017-11-24 DIAGNOSIS — Z882 Allergy status to sulfonamides status: Secondary | ICD-10-CM

## 2017-11-24 DIAGNOSIS — E039 Hypothyroidism, unspecified: Secondary | ICD-10-CM | POA: Diagnosis present

## 2017-11-24 DIAGNOSIS — E785 Hyperlipidemia, unspecified: Secondary | ICD-10-CM | POA: Diagnosis present

## 2017-11-24 DIAGNOSIS — S199XXA Unspecified injury of neck, initial encounter: Secondary | ICD-10-CM | POA: Diagnosis not present

## 2017-11-24 DIAGNOSIS — R410 Disorientation, unspecified: Secondary | ICD-10-CM

## 2017-11-24 DIAGNOSIS — Z79890 Hormone replacement therapy (postmenopausal): Secondary | ICD-10-CM

## 2017-11-24 DIAGNOSIS — M810 Age-related osteoporosis without current pathological fracture: Secondary | ICD-10-CM | POA: Diagnosis present

## 2017-11-24 DIAGNOSIS — E639 Nutritional deficiency, unspecified: Secondary | ICD-10-CM | POA: Diagnosis present

## 2017-11-24 DIAGNOSIS — S3991XA Unspecified injury of abdomen, initial encounter: Secondary | ICD-10-CM | POA: Diagnosis not present

## 2017-11-24 DIAGNOSIS — G9341 Metabolic encephalopathy: Secondary | ICD-10-CM | POA: Diagnosis not present

## 2017-11-24 DIAGNOSIS — Z880 Allergy status to penicillin: Secondary | ICD-10-CM

## 2017-11-24 DIAGNOSIS — S0990XA Unspecified injury of head, initial encounter: Secondary | ICD-10-CM

## 2017-11-24 DIAGNOSIS — S0083XA Contusion of other part of head, initial encounter: Secondary | ICD-10-CM | POA: Diagnosis not present

## 2017-11-24 DIAGNOSIS — F3181 Bipolar II disorder: Secondary | ICD-10-CM | POA: Diagnosis present

## 2017-11-24 DIAGNOSIS — G43909 Migraine, unspecified, not intractable, without status migrainosus: Secondary | ICD-10-CM | POA: Diagnosis present

## 2017-11-24 DIAGNOSIS — W19XXXA Unspecified fall, initial encounter: Secondary | ICD-10-CM | POA: Diagnosis present

## 2017-11-24 DIAGNOSIS — R404 Transient alteration of awareness: Secondary | ICD-10-CM | POA: Diagnosis not present

## 2017-11-24 DIAGNOSIS — E876 Hypokalemia: Secondary | ICD-10-CM | POA: Diagnosis present

## 2017-11-24 DIAGNOSIS — S060X9A Concussion with loss of consciousness of unspecified duration, initial encounter: Secondary | ICD-10-CM | POA: Diagnosis not present

## 2017-11-24 DIAGNOSIS — F419 Anxiety disorder, unspecified: Secondary | ICD-10-CM | POA: Diagnosis present

## 2017-11-24 DIAGNOSIS — R000 Tachycardia, unspecified: Secondary | ICD-10-CM | POA: Diagnosis present

## 2017-11-24 DIAGNOSIS — Z888 Allergy status to other drugs, medicaments and biological substances status: Secondary | ICD-10-CM

## 2017-11-24 DIAGNOSIS — R4182 Altered mental status, unspecified: Secondary | ICD-10-CM | POA: Diagnosis not present

## 2017-11-24 DIAGNOSIS — Z881 Allergy status to other antibiotic agents status: Secondary | ICD-10-CM

## 2017-11-24 DIAGNOSIS — Z98818 Other dental procedure status: Secondary | ICD-10-CM

## 2017-11-24 DIAGNOSIS — G894 Chronic pain syndrome: Secondary | ICD-10-CM | POA: Diagnosis present

## 2017-11-24 DIAGNOSIS — K219 Gastro-esophageal reflux disease without esophagitis: Secondary | ICD-10-CM | POA: Diagnosis present

## 2017-11-24 DIAGNOSIS — S299XXA Unspecified injury of thorax, initial encounter: Secondary | ICD-10-CM | POA: Diagnosis not present

## 2017-11-24 DIAGNOSIS — Z7982 Long term (current) use of aspirin: Secondary | ICD-10-CM

## 2017-11-24 DIAGNOSIS — I480 Paroxysmal atrial fibrillation: Secondary | ICD-10-CM | POA: Diagnosis present

## 2017-11-24 DIAGNOSIS — I4891 Unspecified atrial fibrillation: Secondary | ICD-10-CM | POA: Diagnosis not present

## 2017-11-24 LAB — I-STAT CHEM 8, ED
BUN: 41 mg/dL — ABNORMAL HIGH (ref 8–23)
Calcium, Ion: 1.12 mmol/L — ABNORMAL LOW (ref 1.15–1.40)
Chloride: 103 mmol/L (ref 98–111)
Creatinine, Ser: 0.9 mg/dL (ref 0.44–1.00)
Glucose, Bld: 96 mg/dL (ref 70–99)
HCT: 42 % (ref 36.0–46.0)
Hemoglobin: 14.3 g/dL (ref 12.0–15.0)
Potassium: 4.1 mmol/L (ref 3.5–5.1)
Sodium: 136 mmol/L (ref 135–145)
TCO2: 29 mmol/L (ref 22–32)

## 2017-11-24 LAB — COMPREHENSIVE METABOLIC PANEL
ALT: 24 U/L (ref 0–44)
AST: 33 U/L (ref 15–41)
Albumin: 4.4 g/dL (ref 3.5–5.0)
Alkaline Phosphatase: 53 U/L (ref 38–126)
Anion gap: 16 — ABNORMAL HIGH (ref 5–15)
BUN: 26 mg/dL — ABNORMAL HIGH (ref 8–23)
CO2: 21 mmol/L — ABNORMAL LOW (ref 22–32)
Calcium: 9.7 mg/dL (ref 8.9–10.3)
Chloride: 100 mmol/L (ref 98–111)
Creatinine, Ser: 1.05 mg/dL — ABNORMAL HIGH (ref 0.44–1.00)
GFR calc Af Amer: >60 mL/min (ref >60)
GFR calc non Af Amer: 54 mL/min — ABNORMAL LOW (ref >60)
Glucose, Bld: 93 mg/dL (ref 70–99)
Potassium: 3.4 mmol/L — ABNORMAL LOW (ref 3.5–5.1)
Sodium: 137 mmol/L (ref 135–145)
Total Bilirubin: 1.4 mg/dL — ABNORMAL HIGH (ref 0.3–1.2)
Total Protein: 7.3 g/dL (ref 6.5–8.1)

## 2017-11-24 LAB — URINALYSIS, ROUTINE W REFLEX MICROSCOPIC
Bacteria, UA: NONE SEEN
Bilirubin Urine: NEGATIVE
Glucose, UA: NEGATIVE mg/dL
Hgb urine dipstick: NEGATIVE
Ketones, ur: 80 mg/dL — AB
Leukocytes, UA: NEGATIVE
Nitrite: NEGATIVE
Protein, ur: 30 mg/dL — AB
Specific Gravity, Urine: >1.046 — ABNORMAL HIGH (ref 1.005–1.030)
pH: 6 (ref 5.0–8.0)

## 2017-11-24 LAB — CBC WITH DIFFERENTIAL/PLATELET
Abs Immature Granulocytes: 0.1 10*3/uL (ref 0.0–0.1)
Basophils Absolute: 0.1 10*3/uL (ref 0.0–0.1)
Basophils Relative: 1 %
Eosinophils Absolute: 0 10*3/uL (ref 0.0–0.7)
Eosinophils Relative: 0 %
HCT: 42.1 % (ref 36.0–46.0)
Hemoglobin: 13.9 g/dL (ref 12.0–15.0)
Immature Granulocytes: 1 %
Lymphocytes Relative: 15 %
Lymphs Abs: 1.7 10*3/uL (ref 0.7–4.0)
MCH: 31.2 pg (ref 26.0–34.0)
MCHC: 33 g/dL (ref 30.0–36.0)
MCV: 94.4 fL (ref 78.0–100.0)
Monocytes Absolute: 1.2 10*3/uL — ABNORMAL HIGH (ref 0.1–1.0)
Monocytes Relative: 11 %
Neutro Abs: 8 10*3/uL — ABNORMAL HIGH (ref 1.7–7.7)
Neutrophils Relative %: 72 %
Platelets: 350 10*3/uL (ref 150–400)
RBC: 4.46 MIL/uL (ref 3.87–5.11)
RDW: 13.2 % (ref 11.5–15.5)
WBC: 11.1 10*3/uL — ABNORMAL HIGH (ref 4.0–10.5)

## 2017-11-24 LAB — AMMONIA: Ammonia: 15 umol/L (ref 9–35)

## 2017-11-24 LAB — PROTIME-INR
INR: 1.1
Prothrombin Time: 14.1 seconds (ref 11.4–15.2)

## 2017-11-24 LAB — ETHANOL: Alcohol, Ethyl (B): <10 mg/dL (ref <10)

## 2017-11-24 LAB — I-STAT TROPONIN, ED: Troponin i, poc: 0.02 ng/mL (ref 0.00–0.08)

## 2017-11-24 LAB — CK TOTAL AND CKMB (NOT AT ARMC)
CK, MB: 6.5 ng/mL — ABNORMAL HIGH (ref 0.5–5.0)
Relative Index: 1.8 (ref 0.0–2.5)
Total CK: 359 U/L — ABNORMAL HIGH (ref 38–234)

## 2017-11-24 LAB — CBG MONITORING, ED: Glucose-Capillary: 93 mg/dL (ref 70–99)

## 2017-11-24 LAB — SALICYLATE LEVEL: Salicylate Lvl: <7 mg/dL (ref 2.8–30.0)

## 2017-11-24 LAB — ACETAMINOPHEN LEVEL: Acetaminophen (Tylenol), Serum: <10 ug/mL — ABNORMAL LOW (ref 10–30)

## 2017-11-24 LAB — TSH: TSH: 4.349 u[IU]/mL (ref 0.350–4.500)

## 2017-11-24 LAB — APTT: aPTT: 31 seconds (ref 24–36)

## 2017-11-24 MED ORDER — PANTOPRAZOLE SODIUM 40 MG PO TBEC
40.0000 mg | DELAYED_RELEASE_TABLET | Freq: Every day | ORAL | Status: DC
  Administered 2017-11-25 – 2017-11-26 (×2): 40 mg via ORAL
  Filled 2017-11-24 (×2): qty 1

## 2017-11-24 MED ORDER — LACTATED RINGERS IV SOLN: INTRAVENOUS | Status: AC

## 2017-11-24 MED ORDER — APIXABAN 5 MG PO TABS
5.0000 mg | ORAL_TABLET | Freq: Two times a day (BID) | ORAL | Status: DC
  Administered 2017-11-24 – 2017-11-26 (×4): 5 mg via ORAL
  Filled 2017-11-24 (×4): qty 1

## 2017-11-24 MED ORDER — QUETIAPINE FUMARATE ER 200 MG PO TB24
200.0000 mg | ORAL_TABLET | Freq: Every day | ORAL | Status: DC
  Filled 2017-11-24: qty 1

## 2017-11-24 MED ORDER — LACTATED RINGERS IV BOLUS
1000.0000 mL | Freq: Once | INTRAVENOUS | Status: AC
  Administered 2017-11-24: 1000 mL via INTRAVENOUS

## 2017-11-24 MED ORDER — PRAVASTATIN SODIUM 20 MG PO TABS
20.0000 mg | ORAL_TABLET | Freq: Every day | ORAL | Status: DC
  Administered 2017-11-25 – 2017-11-26 (×2): 20 mg via ORAL
  Filled 2017-11-24 (×2): qty 1

## 2017-11-24 MED ORDER — RISPERIDONE 1 MG PO TABS
1.0000 mg | ORAL_TABLET | Freq: Every day | ORAL | Status: DC
  Administered 2017-11-24 – 2017-11-26 (×3): 1 mg via ORAL
  Filled 2017-11-24 (×2): qty 1
  Filled 2017-11-24 (×2): qty 2
  Filled 2017-11-24: qty 1

## 2017-11-24 MED ORDER — LEVOTHYROXINE SODIUM 112 MCG PO TABS
112.0000 ug | ORAL_TABLET | Freq: Every day | ORAL | Status: DC
  Administered 2017-11-25 – 2017-11-26 (×2): 112 ug via ORAL
  Filled 2017-11-24 (×3): qty 1

## 2017-11-24 MED ORDER — IOHEXOL 300 MG/ML  SOLN
100.0000 mL | Freq: Once | INTRAMUSCULAR | Status: AC | PRN
  Administered 2017-11-24: 100 mL via INTRAVENOUS

## 2017-11-24 MED ORDER — POTASSIUM CHLORIDE 10 MEQ/100ML IV SOLN
10.0000 meq | INTRAVENOUS | Status: AC
  Administered 2017-11-24: 10 meq via INTRAVENOUS
  Filled 2017-11-24: qty 100

## 2017-11-24 MED ORDER — METOPROLOL TARTRATE 50 MG PO TABS
50.0000 mg | ORAL_TABLET | Freq: Two times a day (BID) | ORAL | Status: DC
  Administered 2017-11-24 – 2017-11-26 (×4): 50 mg via ORAL
  Filled 2017-11-24: qty 2
  Filled 2017-11-24 (×3): qty 1

## 2017-11-24 MED ORDER — SODIUM CHLORIDE 0.9% FLUSH
3.0000 mL | Freq: Two times a day (BID) | INTRAVENOUS | Status: DC
  Administered 2017-11-25: 3 mL via INTRAVENOUS

## 2017-11-24 MED ORDER — DIPHENHYDRAMINE HCL 25 MG PO CAPS
25.0000 mg | ORAL_CAPSULE | Freq: Once | ORAL | Status: AC
  Administered 2017-11-24: 25 mg via ORAL
  Filled 2017-11-24: qty 1

## 2017-11-24 NOTE — ED Notes (Signed)
hospitalist at bedside

## 2017-11-24 NOTE — ED Triage Notes (Signed)
Pt brought in by [facility] from home for AMS- symptoms noticed by neighbor on Wednesday. Per EMS neighbor was a poor historian and unable to communicate exactly how pt was "acting different". Pt fell x9 days ago and was not evaluated. Significant bruising noted around pt eyes and cheeks. Pt currently A+Ox1. Per EMS the time between her fall 9 days ago and her neighbor's visit on Wednesday is not accounted for. Pt in NAD on arrival.

## 2017-11-24 NOTE — H&P (Signed)
History and Physical   Meredith [name] ZTI:[id number] DOB: Dec 20, 1950 DOA: 11/24/2017  PCP: Kathyrn Lass, MD  Chief Complaint: Confusion  HPI: This is a 67 year old woman with medical problems including bipolar disorder, chronic pain syndrome, hypothyroidism, migraine headaches, atrial fibrillation status post ablation procedure in the past not on anticoagulation prior to admission presenting with confusion.  History is limited due to patient's current mental state, history is obtained via chart review as well as her fraternal twin brother.  Recent events include, about 1 week prior to admission she underwent multiple teeth extractions in preparation for dentures.  She reports taking periprocedural antimicrobials including erythromycin.  Over the past 3 to 4 days prior to admission, she has been becoming more confused.  She denies any new medication changes, however she does report impaired recent memory.  She underwent ablation procedure over one year ago, currently on aspirin alone in addition to rate control.  Brother reports that she fell within the past week of suffering some traumatic injury to her face.  Patient reports she did have syncope, no other recollection of precipitating factors.  At baseline, she reports living alone, independent in her ADLs and IADLs.  She identifies her psychiatrist is Dr. Clovis Pu.  She reports taking multiple CNS acting medications including, pregabalin, Seroquel, lamotrigine, Risperdal.  Home medication list also has verapamil, propranolol, metoprolol.  Brother reports he checked it on her today, due to her confusion sought medical attention.  They do report decreased p.o. intake due to her recent dental extractions.  ED Course: In the emergency room vital signs remarkable for tachycardia to 115, no hypotension noted.  CBC with leukocytosis 11.1, ANC of 8000.  CMP remarkable for T bili of 1.4, potassium 3.4.  BUN elevated to 41.  Extensive CT imaging including CT of  the head, CT the spine, CT of the chest and pelvis revealed no acute fractures, no intracranial bleeding, hepatic steatosis, as well as prior thoracic spine surgery.  The patient was given 1 L of lactated Ringer's, hospital medicine consulted for further management due to her persistent confusion.   Review of Systems: A complete ROS was obtained; pertinent positives negatives are denoted in the HPI. Otherwise, all systems are negative.   Past Medical History:  Diagnosis Date  . Anxiety   . Atrial fibrillation ([place])   . Bipolar 2 disorder ([place])   . Depression   . History of cardioversion    x2  . Hyperlipidemia   . Hypothyroidism   . Migraine headache   . Osteoporosis    Social History   Socioeconomic History  . Marital status: Single    Spouse name: Not on file  . Number of children: Not on file  . Years of education: Not on file  . Highest education level: Not on file  Occupational History  . Not on file  Social Needs  . Financial resource strain: Not on file  . Food insecurity:    Worry: Not on file    Inability: Not on file  . Transportation needs:    Medical: Not on file    Non-medical: Not on file  Tobacco Use  . Smoking status: Never Smoker  . Smokeless tobacco: Never Used  Substance and Sexual Activity  . Alcohol use: No  . Drug use: No  . Sexual activity: Not on file  Lifestyle  . Physical activity:    Days per week: Not on file    Minutes per session: Not on file  . Stress: Not  on file  Relationships  . Social connections:    Talks on phone: Not on file    Gets together: Not on file    Attends religious service: Not on file    Active member of club or organization: Not on file    Attends meetings of clubs or organizations: Not on file    Relationship status: Not on file  . Intimate partner violence:    Fear of current or ex partner: Not on file    Emotionally abused: Not on file    Physically abused: Not on file    Forced sexual activity: Not on  file  Other Topics Concern  . Not on file  Social History Narrative  . Not on file   Family History  Problem Relation Age of Onset  . Atrial fibrillation Brother     Physical Exam: Vitals:   11/24/17 2000 11/24/17 2015 11/24/17 2030 11/24/17 2104  BP: (!) 151/82 (!) 146/83 (!) 158/75 (!) 167/80  Pulse: (!) 107 92 98 (!) 112  Resp:  15 13 (!) 22  Temp:      TempSrc:      SpO2: 98% 97% 100% 100%  Weight:      Height:       General: Appears calm and comfortable, overweight white woman ENT: Grossly normal hearing, MMM, bilateral peri-orbital ecchymoses, gingival sutures in place Cardiovascular: Tachycardic, irregular. No M/R/G. Trace B/L LE edema. Respiratory: CTA bilaterally. No wheezes or crackles. Normal respiratory effort. Abdomen: Soft, non-tender. Skin: No rash or induration seen on limited exam. Musculoskeletal: Grossly normal tone BUE/BLE. Appropriate ROM. Prior thoracic spine scar well healed Psychiatric: Flat affect Neurologic: Appears tired, but is alert and answers questions - slow cognitive recall, not oriented to year or recent events (cannot list medications) - brother at bedside reports this is a change from baseline  I have personally reviewed the following labs, culture data, and imaging studies.  Assessment/Plan:  #Encephalopathy Course: 1 week of progressive confusion prior to admission, within the past week had a fall with syncope as well as recent dental extractions A/P: No clear etiology at this time, no grossly metabolic derangements (mildy elevated TSH of 5 unlikely to be culprit), infection a consideration (WBC of 11, no clear source evident, but perhaps dental extractions place at risk for bacteremia) - blood cultures pending. Polypharmacy possible most likely culprit with multiple sedating medications present on home profile (lamotrigine, pregabalin, risperdal, seroquel, two BB agents, opioids).  Will limit sedating medications and only continue  risperdal and seroquel at reduced dose for now. Monitor clinical status.  If no clear causes identified and clinical status does not improve, consider neurology consultation, and/or MRI of brain to evaluate for CVA in presence of new recurrence of AF.  #Other problems: -AF RVR: no hypotension, is s/p ablation procedures in the past, after last ablation stopped AC and was on ASA alone. Given AF recurrence this admission, will initiate DOAC therapy with Eliqus, patient agrees for stroke prevention.  Will continue home metoprolol at a dose of 50 mg bid. Hold verapamil and propranolol.  Monitor on tele. -Bipolar d/o: continuing seroquel and resperdal at reduced doses, consider psychiatry consult for medication optimization during this admission -Hypothyroidism: TSH of 5, continue home dose -Pain syndrome: holding sedating home medications -GERD: continue home PPI -Under/dehydration: BUN of 41, perhaps some decreased PO intake, will support with IVF with LR at 100 cc q hr x 10 hrs then re-assess -Hypokalemia: mild on admission, K of 3.4, replacement  ordered  DVT prophylaxis: On DOAC therapy Code Status: Full code after discussion on admission Disposition Plan: Anticipate D/C home in 2-5 days Consults called: none Admission status: admit to telemetry floor   Cheri Rous, MD [facility] [phone]  If 7PM-7AM, please contact night-coverage [email or web address] Password [email or web address]  This document was created using the aid of voice recognition / dication software.

## 2017-11-24 NOTE — ED Provider Notes (Signed)
[place] EMERGENCY DEPARTMENT Provider Note   CSN: [id number] Arrival date & time: 11/24/17  1523     History   Chief Complaint Chief Complaint  Patient presents with  . Altered Mental Status  . Fall    HPI Meredith [name] is a 67 y.o. female.  HPI   67 year old female with PMH significant for proximal A. fib, bipolar type II, chronic pain syndrome, anxiety who presents status post fall with altered mental status.  Patient friend who is not available at bedside states that patient fell 9 days ago.  Patient unable to specify events surrounding fall.  6 days ago patient had an outpatient dental appointment which is able to maintain without difficulty.  5 days ago patient friend checked in on her and found her slightly altered but he is unsure of how.  Today patient friend went to check on the patient and found her acutely altered, only oriented to self.  Friend called EMS who brought patient to [facility] without intervention.  Patient planing of pain to her left forehead.  Patient unable to contribute to history review of systems due to altered mental status.   Past Medical History:  Diagnosis Date  . Anxiety   . Atrial fibrillation ([place])   . Bipolar 2 disorder ([place])   . Depression   . History of cardioversion    x2  . Hyperlipidemia   . Hypothyroidism   . Migraine headache   . Osteoporosis     Patient Active Problem List   Diagnosis Date Noted  . Delirium 11/24/2017  . Anxiety 11/29/2016  . Chronic pain syndrome 07/30/2016  . Bipolar disorder, curr episode mixed, severe, with psychotic features ([place]) 07/28/2016  . PAF (paroxysmal atrial fibrillation) ([place]) 11/14/2015    Past Surgical History:  Procedure Laterality Date  . ABLATION    . BACK SURGERY  2014  . CARDIOVERSION  12/2013, 01/2014   x2  . KNEE SURGERY     ORTHOSCOPIC  . LUNG REMOVAL, PARTIAL Right    BENIGN LUNG CYST  . THORACIC SPINE SURGERY Right   . THUMB  ARTHROSCOPY       OB History   None      Home Medications    Prior to Admission medications   Medication Sig Start Date End Date Taking? Authorizing Provider  aspirin 325 MG tablet Take 325 mg by mouth daily.    Yes [provider]  calcium carbonate (OS-CAL - DOSED IN MG OF ELEMENTAL CALCIUM) 1250 (500 Ca) MG tablet Take 1 tablet (500 mg of elemental calcium total) by mouth daily. For bone health 08/01/16  Yes Lindell Spar I, NP  Cholecalciferol (VITAMIN D3) 1000 units CAPS Take 1,000 Units by mouth daily.    Yes [provider]  clotrimazole (LOTRIMIN) 1 % cream Apply 1 application topically 2 (two) times daily. For yeast rash 07/31/16  Yes Lindell Spar I, NP  diphenoxylate-atropine (LOMOTIL) 2.5-0.025 MG tablet Take 1 tablet by mouth 4 (four) times daily as needed for diarrhea or loose stools.   Yes [provider]  Erenumab-aooe (AIMOVIG) 70 MG/ML SOAJ Inject 70 mg into the skin every 30 (thirty) days.   Yes [provider]  esomeprazole (NEXIUM) 40 MG capsule Take 1 capsule (40 mg total) by mouth daily as needed. For acid reflux 07/31/16  Yes Nwoko, Herbert Pun I, NP  estradiol (ESTRACE) 1 MG tablet Take 1.5 tablets (1.5 mg total) by mouth daily. For hormone replacement 07/31/16  Yes Lindell Spar I, NP  ferrous sulfate 325 (65 FE) MG tablet Take 1 tablet (325 mg total) by mouth daily with breakfast. Ferrous sulfate 07/31/16  Yes Nwoko, Agnes I, NP  HYDROcodone-acetaminophen (NORCO) 7.5-325 MG tablet Take 1 tablet by mouth 2 (two) times daily as needed for pain. 11/04/16  Yes [provider]  hydrOXYzine (ATARAX/VISTARIL) 50 MG tablet Take 1 tablet (50 mg total) by mouth 3 (three) times daily as needed for anxiety. 07/31/16  Yes Lindell Spar I, NP  lamoTRIgine (LAMICTAL) 100 MG tablet Take 100 mg by mouth every morning. 10/29/17  Yes [provider]  levothyroxine (SYNTHROID, LEVOTHROID) 112 MCG tablet Take 1 tablet (112 mcg total) by mouth daily before  breakfast. For low thyroid hormone 07/31/16  Yes Nwoko, Agnes I, NP  Melatonin 5 MG TABS Take 30 mg by mouth at bedtime.    Yes [provider]  metoprolol succinate (TOPROL-XL) 50 MG 24 hr tablet Take 1 tablet (50 mg total) by mouth daily. Take with or immediately following a meal. 11/29/16 11/24/17 Yes [name], Wonda Cheng, MD  naproxen sodium (ANAPROX) 220 MG tablet Take 220 mg by mouth 2 (two) times daily with a meal.   Yes [provider]  ondansetron (ZOFRAN) 4 MG tablet Take 1 tablet (4 mg total) by mouth 3 (three) times daily as needed. As needed for nausea 07/31/16  Yes Nwoko, Herbert Pun I, NP  pantoprazole (PROTONIX) 40 MG tablet Take 40 mg by mouth daily. 10/31/17  Yes [provider]  pravastatin (PRAVACHOL) 20 MG tablet Take 20 mg by mouth daily. 10/22/17  Yes [provider]  pregabalin (LYRICA) 150 MG capsule Take 1 capsule (150 mg total) by mouth 3 (three) times daily. For nerve pain 07/31/16  Yes Lindell Spar I, NP  propranolol (INDERAL) 10 MG tablet Take 1 tablet (10 mg total) by mouth 4 (four) times daily as needed. 11/29/16  Yes [name], Wonda Cheng, MD  QUEtiapine (SEROQUEL XR) 300 MG 24 hr tablet Take 600 mg by mouth at bedtime.   Yes [provider]  risperiDONE (RISPERDAL) 1 MG tablet Take 1 mg by mouth daily.   Yes [provider]  senna-docusate (PERI-COLACE) 8.6-50 MG tablet Take 2 tablets by mouth 2 (two) times daily. For constipation 07/31/16  Yes Nwoko, Herbert Pun I, NP  verapamil (VERELAN PM) 120 MG 24 hr capsule Take 1 capsule (120 mg total) by mouth at bedtime. For high blood pressure 07/31/16  Yes Encarnacion Slates, NP    Family History Family History  Problem Relation Age of Onset  . Atrial fibrillation Brother     Social History Social History   Tobacco Use  . Smoking status: Never Smoker  . Smokeless tobacco: Never Used  Substance Use Topics  . Alcohol use: No  . Drug use: No     Allergies   Codeine; Celebrex [celecoxib]; Sulfa  antibiotics; Tricyclic antidepressants; Amoxicillin; Ampicillin; and Penicillins   Review of Systems Review of Systems  Unable to perform ROS: Mental status change  All other systems reviewed and are negative.    Physical Exam Updated Vital Signs BP (!) 167/80   Pulse (!) 112   Temp 99 F (37.2 C) (Oral)   Resp (!) 22   Ht 5\' 1"  (1.549 m)   Wt 70.3 kg   LMP  (LMP Unknown)   SpO2 100%   BMI 29.29 kg/m   Physical Exam  Constitutional: She appears well-developed and well-nourished. No distress.  HENT:  Head:  Normocephalic. Head is with raccoon's eyes. Head is without Battle's sign and without laceration.    Eyes: Pupils are equal, round, and reactive to light. Conjunctivae and EOM are normal.  Neck: Neck supple.  Cardiovascular: Normal rate and regular rhythm.  No murmur heard. Pulmonary/Chest: Effort normal and breath sounds normal. No respiratory distress.  Abdominal: Soft. There is no tenderness.  Musculoskeletal: She exhibits no edema.  Neurological: She is alert. She is disoriented. GCS eye subscore is 4. GCS verbal subscore is 5. GCS motor subscore is 6.  PERRLA, CN II through XII intact, 5-5 motor in bilateral upper and lower extremities, normal sensation throughout.  Patient only oriented to first name.  Unable to say last name, month year or president.  Skin: Skin is warm and dry.     Psychiatric: She has a normal mood and affect.  Nursing note and vitals reviewed.    ED Treatments / Results  Labs (all labs ordered are listed, but only abnormal results are displayed) Labs Reviewed  CK TOTAL AND CKMB (NOT AT [facility]) - Abnormal; Notable for the following components:      Result Value   Total CK 359 (*)    CK, MB 6.5 (*)    All other components within normal limits  CBC WITH DIFFERENTIAL/PLATELET - Abnormal; Notable for the following components:   WBC 11.1 (*)    Neutro Abs 8.0 (*)    Monocytes Absolute 1.2 (*)    All other components within normal limits    COMPREHENSIVE METABOLIC PANEL - Abnormal; Notable for the following components:   Potassium 3.4 (*)    CO2 21 (*)    BUN 26 (*)    Creatinine, Ser 1.05 (*)    Total Bilirubin 1.4 (*)    GFR calc non Af Amer 54 (*)    Anion gap 16 (*)    All other components within normal limits  ACETAMINOPHEN LEVEL - Abnormal; Notable for the following components:   Acetaminophen (Tylenol), Serum <10 (*)    All other components within normal limits  I-STAT CHEM 8, ED - Abnormal; Notable for the following components:   BUN 41 (*)    Calcium, Ion 1.12 (*)    All other components within normal limits  PROTIME-INR  ETHANOL  APTT  SALICYLATE LEVEL  AMMONIA  RAPID URINE DRUG SCREEN, HOSP PERFORMED  URINALYSIS, ROUTINE W REFLEX MICROSCOPIC  TSH  HIV ANTIBODY (ROUTINE TESTING W REFLEX)  COMPREHENSIVE METABOLIC PANEL  CBC  TSH  I-STAT TROPONIN, ED  I-STAT TROPONIN, ED  CBG MONITORING, ED  I-STAT CHEM 8, ED    EKG None  Radiology Ct Head Wo Contrast  Result Date: 11/24/2017 CLINICAL DATA:  Altered mental status, facial bruising after fall 9 days ago. EXAM: CT HEAD WITHOUT CONTRAST CT MAXILLOFACIAL WITHOUT CONTRAST CT CERVICAL SPINE WITHOUT CONTRAST TECHNIQUE: Multidetector CT imaging of the head, cervical spine, and maxillofacial structures were performed using the standard protocol without intravenous contrast. Multiplanar CT image reconstructions of the cervical spine and maxillofacial structures were also generated. COMPARISON:  None. FINDINGS: CT HEAD FINDINGS Brain: Mild chronic ischemic white matter disease is noted. No mass effect or midline shift is noted. Ventricular size is within normal limits. There is no evidence of mass lesion, hemorrhage or acute infarction. Vascular: No hyperdense vessel or unexpected calcification. Skull: Normal. Negative for fracture or focal lesion. Other: None. CT MAXILLOFACIAL FINDINGS Osseous: No fracture or mandibular dislocation. No destructive process. Orbits:  Negative. No traumatic or inflammatory finding. Sinuses:  Mild right maxillary mucosal thickening is noted. Remaining paranasal sinuses are unremarkable. Soft tissues: Negative. CT CERVICAL SPINE FINDINGS Alignment: Normal. Skull base and vertebrae: No acute fracture. No primary bone lesion or focal pathologic process. Soft tissues and spinal canal: No prevertebral fluid or swelling. No visible canal hematoma. Disc levels:  None. Upper chest: Negative. Other: None. IMPRESSION: Mild chronic ischemic white matter disease. No acute intracranial abnormality seen. Mild right maxillary sinus mucosal thickening is noted. No acute abnormality seen in the maxillofacial region. Normal cervical spine. Electronically Signed   By: Marijo Conception, M.D.   On: 11/24/2017 18:58   Ct Chest W Contrast  Result Date: 11/24/2017 CLINICAL DATA:  Acute mental status changes. Fall 9 days ago. Abdominal blunt trauma. EXAM: CT CHEST, ABDOMEN, AND PELVIS WITH CONTRAST TECHNIQUE: Multidetector CT imaging of the chest, abdomen and pelvis was performed following the standard protocol during bolus administration of intravenous contrast. CONTRAST:  124mL OMNIPAQUE IOHEXOL 300 MG/ML  SOLN COMPARISON:  None. FINDINGS: CT CHEST FINDINGS Cardiovascular: The thoracic aorta is normal with no atherosclerosis, aneurysm, or dissection. The central pulmonary artery is normal. No filling defects in the visualized opacified branches of pulmonary artery. Mediastinum/Nodes: There is a tiny hiatal hernia. The esophagus and thyroid are unremarkable. The chest wall is without obvious abnormality. No adenopathy or effusion. Lungs/Pleura: Atelectasis in the anterior right upper lobe. No pneumothorax. Central airways are normal. No pulmonary nodules, masses, or focal infiltrates. Musculoskeletal: Pedicle rods and screws are seen in the thoracic spine. No hardware failure identified. No rib fractures noted. No other fracture seen within the thorax. CT ABDOMEN  PELVIS FINDINGS Hepatobiliary: A few tiny lesions in the liver are too small to characterize but almost certainly cysts or another benign etiology. Hepatic steatosis. The portal vein is patent. The gallbladder is unremarkable. Pancreas: Unremarkable. No pancreatic ductal dilatation or surrounding inflammatory changes. Spleen: Normal in size without focal abnormality. Adrenals/Urinary Tract: Adrenal glands are unremarkable. Kidneys are normal, without renal calculi, focal lesion, or hydronephrosis. Bladder is unremarkable. Stomach/Bowel: Stomach is within normal limits. Appendix appears normal. No evidence of bowel wall thickening, distention, or inflammatory changes. Vascular/Lymphatic: No significant vascular findings are present. No enlarged abdominal or pelvic lymph nodes. Reproductive: Status post hysterectomy. No adnexal masses. Other: No abdominal wall hernia or abnormality. No abdominopelvic ascites. Musculoskeletal: No acute or significant osseous findings. IMPRESSION: 1. No soft tissue or bony injury identified. 2. No other significant abnormalities. Electronically Signed   By: Dorise Bullion III M.D   On: 11/24/2017 18:49   Ct Cervical Spine Wo Contrast  Result Date: 11/24/2017 CLINICAL DATA:  Altered mental status, facial bruising after fall 9 days ago. EXAM: CT HEAD WITHOUT CONTRAST CT MAXILLOFACIAL WITHOUT CONTRAST CT CERVICAL SPINE WITHOUT CONTRAST TECHNIQUE: Multidetector CT imaging of the head, cervical spine, and maxillofacial structures were performed using the standard protocol without intravenous contrast. Multiplanar CT image reconstructions of the cervical spine and maxillofacial structures were also generated. COMPARISON:  None. FINDINGS: CT HEAD FINDINGS Brain: Mild chronic ischemic white matter disease is noted. No mass effect or midline shift is noted. Ventricular size is within normal limits. There is no evidence of mass lesion, hemorrhage or acute infarction. Vascular: No hyperdense  vessel or unexpected calcification. Skull: Normal. Negative for fracture or focal lesion. Other: None. CT MAXILLOFACIAL FINDINGS Osseous: No fracture or mandibular dislocation. No destructive process. Orbits: Negative. No traumatic or inflammatory finding. Sinuses: Mild right maxillary mucosal thickening is noted. Remaining paranasal sinuses are unremarkable. Soft tissues:  Negative. CT CERVICAL SPINE FINDINGS Alignment: Normal. Skull base and vertebrae: No acute fracture. No primary bone lesion or focal pathologic process. Soft tissues and spinal canal: No prevertebral fluid or swelling. No visible canal hematoma. Disc levels:  None. Upper chest: Negative. Other: None. IMPRESSION: Mild chronic ischemic white matter disease. No acute intracranial abnormality seen. Mild right maxillary sinus mucosal thickening is noted. No acute abnormality seen in the maxillofacial region. Normal cervical spine. Electronically Signed   By: Marijo Conception, M.D.   On: 11/24/2017 18:58   Ct Abdomen Pelvis W Contrast  Result Date: 11/24/2017 CLINICAL DATA:  Acute mental status changes. Fall 9 days ago. Abdominal blunt trauma. EXAM: CT CHEST, ABDOMEN, AND PELVIS WITH CONTRAST TECHNIQUE: Multidetector CT imaging of the chest, abdomen and pelvis was performed following the standard protocol during bolus administration of intravenous contrast. CONTRAST:  180mL OMNIPAQUE IOHEXOL 300 MG/ML  SOLN COMPARISON:  None. FINDINGS: CT CHEST FINDINGS Cardiovascular: The thoracic aorta is normal with no atherosclerosis, aneurysm, or dissection. The central pulmonary artery is normal. No filling defects in the visualized opacified branches of pulmonary artery. Mediastinum/Nodes: There is a tiny hiatal hernia. The esophagus and thyroid are unremarkable. The chest wall is without obvious abnormality. No adenopathy or effusion. Lungs/Pleura: Atelectasis in the anterior right upper lobe. No pneumothorax. Central airways are normal. No pulmonary nodules,  masses, or focal infiltrates. Musculoskeletal: Pedicle rods and screws are seen in the thoracic spine. No hardware failure identified. No rib fractures noted. No other fracture seen within the thorax. CT ABDOMEN PELVIS FINDINGS Hepatobiliary: A few tiny lesions in the liver are too small to characterize but almost certainly cysts or another benign etiology. Hepatic steatosis. The portal vein is patent. The gallbladder is unremarkable. Pancreas: Unremarkable. No pancreatic ductal dilatation or surrounding inflammatory changes. Spleen: Normal in size without focal abnormality. Adrenals/Urinary Tract: Adrenal glands are unremarkable. Kidneys are normal, without renal calculi, focal lesion, or hydronephrosis. Bladder is unremarkable. Stomach/Bowel: Stomach is within normal limits. Appendix appears normal. No evidence of bowel wall thickening, distention, or inflammatory changes. Vascular/Lymphatic: No significant vascular findings are present. No enlarged abdominal or pelvic lymph nodes. Reproductive: Status post hysterectomy. No adnexal masses. Other: No abdominal wall hernia or abnormality. No abdominopelvic ascites. Musculoskeletal: No acute or significant osseous findings. IMPRESSION: 1. No soft tissue or bony injury identified. 2. No other significant abnormalities. Electronically Signed   By: Dorise Bullion III M.D   On: 11/24/2017 18:49   Ct Maxillofacial Wo Contrast  Result Date: 11/24/2017 CLINICAL DATA:  Altered mental status, facial bruising after fall 9 days ago. EXAM: CT HEAD WITHOUT CONTRAST CT MAXILLOFACIAL WITHOUT CONTRAST CT CERVICAL SPINE WITHOUT CONTRAST TECHNIQUE: Multidetector CT imaging of the head, cervical spine, and maxillofacial structures were performed using the standard protocol without intravenous contrast. Multiplanar CT image reconstructions of the cervical spine and maxillofacial structures were also generated. COMPARISON:  None. FINDINGS: CT HEAD FINDINGS Brain: Mild chronic  ischemic white matter disease is noted. No mass effect or midline shift is noted. Ventricular size is within normal limits. There is no evidence of mass lesion, hemorrhage or acute infarction. Vascular: No hyperdense vessel or unexpected calcification. Skull: Normal. Negative for fracture or focal lesion. Other: None. CT MAXILLOFACIAL FINDINGS Osseous: No fracture or mandibular dislocation. No destructive process. Orbits: Negative. No traumatic or inflammatory finding. Sinuses: Mild right maxillary mucosal thickening is noted. Remaining paranasal sinuses are unremarkable. Soft tissues: Negative. CT CERVICAL SPINE FINDINGS Alignment: Normal. Skull base and vertebrae: No acute fracture.  No primary bone lesion or focal pathologic process. Soft tissues and spinal canal: No prevertebral fluid or swelling. No visible canal hematoma. Disc levels:  None. Upper chest: Negative. Other: None. IMPRESSION: Mild chronic ischemic white matter disease. No acute intracranial abnormality seen. Mild right maxillary sinus mucosal thickening is noted. No acute abnormality seen in the maxillofacial region. Normal cervical spine. Electronically Signed   By: Marijo Conception, M.D.   On: 11/24/2017 18:58    Procedures Procedures (including critical care time)  Medications Ordered in ED Medications  lactated ringers bolus 1,000 mL (1,000 mLs Intravenous New Bag/Given 11/24/17 2023)  pravastatin (PRAVACHOL) tablet 20 mg (has no administration in time range)  levothyroxine (SYNTHROID, LEVOTHROID) tablet 112 mcg (has no administration in time range)  pantoprazole (PROTONIX) EC tablet 40 mg (has no administration in time range)  sodium chloride flush (NS) 0.9 % injection 3 mL (has no administration in time range)  QUEtiapine (SEROQUEL XR) 24 hr tablet 200 mg (has no administration in time range)  risperiDONE (RISPERDAL) 1 MG/ML oral solution 1 mg (has no administration in time range)  metoprolol tartrate (LOPRESSOR) tablet 50 mg  (has no administration in time range)  lactated ringers infusion (has no administration in time range)  potassium chloride 10 mEq in 50 mL *CENTRAL LINE* IVPB (has no administration in time range)  apixaban (ELIQUIS) tablet 5 mg (has no administration in time range)  iohexol (OMNIPAQUE) 300 MG/ML solution 100 mL (100 mLs Intravenous Contrast Given 11/24/17 1738)     Initial Impression / Assessment and Plan / ED Course  I have reviewed the triage vital signs and the nursing notes.  Pertinent labs & imaging results that were available during my care of the patient were reviewed by me and considered in my medical decision making (see chart for details).     67 year old female with PMH significant for proximal A. fib, bipolar type II, chronic pain syndrome, anxiety who presents status post fall with altered mental status.  History as above.  DDX includes intracranial bleed, stroke, versus encephalopathy.  Patient normotensive intermittently tachycardic with A. fib.  Patient with history of A. fib.  Unsure of duration of current A. fib symptoms.  Patient is stable at this time.  Labs and imaging performed.  Labs and imaging negative.  No evidence of intracranial bleed, signs of ischemic changes on head CT.  Labs negative for signs of intoxication patient with elevated BUN and normal creatinine.  Patient given IV fluids potential dehydration.  Mild leukocytosis.  Mildly elevated CK, patient not in rhabdo.  Patient admitted to hospitalist for further evaluation and work-up of acute altered mental status.  Final Clinical Impressions(s) / ED Diagnoses   Final diagnoses:  Disorientation  Injury of head, initial encounter  Contusion of face, initial encounter    ED Discharge Orders    None       Keenan Bachelor, MD 11/24/17 8453    Elnora Morrison, MD 11/24/17 2241

## 2017-11-24 NOTE — ED Notes (Signed)
Patient transported to CT 

## 2017-11-25 DIAGNOSIS — K219 Gastro-esophageal reflux disease without esophagitis: Secondary | ICD-10-CM | POA: Diagnosis present

## 2017-11-25 DIAGNOSIS — F3181 Bipolar II disorder: Secondary | ICD-10-CM | POA: Diagnosis present

## 2017-11-25 DIAGNOSIS — Z885 Allergy status to narcotic agent status: Secondary | ICD-10-CM | POA: Diagnosis not present

## 2017-11-25 DIAGNOSIS — Z881 Allergy status to other antibiotic agents status: Secondary | ICD-10-CM | POA: Diagnosis not present

## 2017-11-25 DIAGNOSIS — D72829 Elevated white blood cell count, unspecified: Secondary | ICD-10-CM | POA: Diagnosis present

## 2017-11-25 DIAGNOSIS — E039 Hypothyroidism, unspecified: Secondary | ICD-10-CM | POA: Diagnosis present

## 2017-11-25 DIAGNOSIS — E785 Hyperlipidemia, unspecified: Secondary | ICD-10-CM | POA: Diagnosis present

## 2017-11-25 DIAGNOSIS — E639 Nutritional deficiency, unspecified: Secondary | ICD-10-CM | POA: Diagnosis present

## 2017-11-25 DIAGNOSIS — Z98818 Other dental procedure status: Secondary | ICD-10-CM | POA: Diagnosis not present

## 2017-11-25 DIAGNOSIS — I480 Paroxysmal atrial fibrillation: Secondary | ICD-10-CM | POA: Diagnosis present

## 2017-11-25 DIAGNOSIS — R000 Tachycardia, unspecified: Secondary | ICD-10-CM | POA: Diagnosis present

## 2017-11-25 DIAGNOSIS — E876 Hypokalemia: Secondary | ICD-10-CM | POA: Diagnosis present

## 2017-11-25 DIAGNOSIS — S060X9A Concussion with loss of consciousness of unspecified duration, initial encounter: Secondary | ICD-10-CM | POA: Diagnosis present

## 2017-11-25 DIAGNOSIS — W19XXXA Unspecified fall, initial encounter: Secondary | ICD-10-CM | POA: Diagnosis present

## 2017-11-25 DIAGNOSIS — G894 Chronic pain syndrome: Secondary | ICD-10-CM | POA: Diagnosis present

## 2017-11-25 DIAGNOSIS — G43909 Migraine, unspecified, not intractable, without status migrainosus: Secondary | ICD-10-CM | POA: Diagnosis present

## 2017-11-25 DIAGNOSIS — R410 Disorientation, unspecified: Secondary | ICD-10-CM | POA: Diagnosis not present

## 2017-11-25 DIAGNOSIS — Z880 Allergy status to penicillin: Secondary | ICD-10-CM | POA: Diagnosis not present

## 2017-11-25 DIAGNOSIS — Z7982 Long term (current) use of aspirin: Secondary | ICD-10-CM | POA: Diagnosis not present

## 2017-11-25 DIAGNOSIS — F419 Anxiety disorder, unspecified: Secondary | ICD-10-CM | POA: Diagnosis present

## 2017-11-25 DIAGNOSIS — Z888 Allergy status to other drugs, medicaments and biological substances status: Secondary | ICD-10-CM | POA: Diagnosis not present

## 2017-11-25 DIAGNOSIS — M810 Age-related osteoporosis without current pathological fracture: Secondary | ICD-10-CM | POA: Diagnosis present

## 2017-11-25 DIAGNOSIS — Z79890 Hormone replacement therapy (postmenopausal): Secondary | ICD-10-CM | POA: Diagnosis not present

## 2017-11-25 DIAGNOSIS — Z882 Allergy status to sulfonamides status: Secondary | ICD-10-CM | POA: Diagnosis not present

## 2017-11-25 DIAGNOSIS — G9341 Metabolic encephalopathy: Secondary | ICD-10-CM | POA: Diagnosis present

## 2017-11-25 LAB — COMPREHENSIVE METABOLIC PANEL
ALT: 18 U/L (ref 0–44)
AST: 24 U/L (ref 15–41)
Albumin: 3.5 g/dL (ref 3.5–5.0)
Alkaline Phosphatase: 48 U/L (ref 38–126)
Anion gap: 11 (ref 5–15)
BUN: 18 mg/dL (ref 8–23)
CO2: 20 mmol/L — ABNORMAL LOW (ref 22–32)
Calcium: 8.8 mg/dL — ABNORMAL LOW (ref 8.9–10.3)
Chloride: 105 mmol/L (ref 98–111)
Creatinine, Ser: 0.83 mg/dL (ref 0.44–1.00)
GFR calc Af Amer: >60 mL/min (ref >60)
GFR calc non Af Amer: >60 mL/min (ref >60)
Glucose, Bld: 100 mg/dL — ABNORMAL HIGH (ref 70–99)
Potassium: 3.1 mmol/L — ABNORMAL LOW (ref 3.5–5.1)
Sodium: 136 mmol/L (ref 135–145)
Total Bilirubin: 1 mg/dL (ref 0.3–1.2)
Total Protein: 6.4 g/dL — ABNORMAL LOW (ref 6.5–8.1)

## 2017-11-25 LAB — HIV ANTIBODY (ROUTINE TESTING W REFLEX): HIV Screen 4th Generation wRfx: NONREACTIVE

## 2017-11-25 LAB — CBC
HCT: 39.3 % (ref 36.0–46.0)
Hemoglobin: 13.3 g/dL (ref 12.0–15.0)
MCH: 31.2 pg (ref 26.0–34.0)
MCHC: 33.8 g/dL (ref 30.0–36.0)
MCV: 92.3 fL (ref 78.0–100.0)
Platelets: 274 10*3/uL (ref 150–400)
RBC: 4.26 MIL/uL (ref 3.87–5.11)
RDW: 13.1 % (ref 11.5–15.5)
WBC: 10.1 10*3/uL (ref 4.0–10.5)

## 2017-11-25 LAB — MAGNESIUM: Magnesium: 2 mg/dL (ref 1.7–2.4)

## 2017-11-25 LAB — GLUCOSE, CAPILLARY: Glucose-Capillary: 102 mg/dL — ABNORMAL HIGH (ref 70–99)

## 2017-11-25 MED ORDER — BUTALBITAL-APAP-CAFFEINE 50-325-40 MG PO TABS
2.0000 | ORAL_TABLET | Freq: Once | ORAL | Status: AC
  Administered 2017-11-25: 2 via ORAL
  Filled 2017-11-25: qty 2

## 2017-11-25 MED ORDER — ONDANSETRON HCL 4 MG/2ML IJ SOLN
4.0000 mg | Freq: Four times a day (QID) | INTRAMUSCULAR | Status: DC | PRN
  Administered 2017-11-25: 4 mg via INTRAVENOUS
  Filled 2017-11-25: qty 2

## 2017-11-25 MED ORDER — HYDROCORTISONE ACETATE 25 MG RE SUPP
25.0000 mg | Freq: Two times a day (BID) | RECTAL | Status: DC
  Filled 2017-11-25 (×5): qty 1

## 2017-11-25 MED ORDER — FERROUS SULFATE 325 (65 FE) MG PO TABS
325.0000 mg | ORAL_TABLET | Freq: Every day | ORAL | Status: DC
  Administered 2017-11-26: 325 mg via ORAL
  Filled 2017-11-25: qty 1

## 2017-11-25 MED ORDER — HYDROXYZINE HCL 25 MG PO TABS: 50.0000 mg | ORAL_TABLET | Freq: Three times a day (TID) | ORAL | Status: DC | PRN

## 2017-11-25 MED ORDER — LACTATED RINGERS IV SOLN
INTRAVENOUS | Status: AC
  Administered 2017-11-25: 14:00:00 via INTRAVENOUS

## 2017-11-25 MED ORDER — QUETIAPINE FUMARATE ER 300 MG PO TB24
600.0000 mg | ORAL_TABLET | Freq: Every day | ORAL | Status: DC
  Administered 2017-11-25: 600 mg via ORAL
  Filled 2017-11-25 (×2): qty 2

## 2017-11-25 MED ORDER — ACETAMINOPHEN 325 MG PO TABS
650.0000 mg | ORAL_TABLET | Freq: Four times a day (QID) | ORAL | Status: DC | PRN
  Administered 2017-11-25 (×2): 650 mg via ORAL
  Filled 2017-11-25 (×2): qty 2

## 2017-11-25 MED ORDER — PREGABALIN 75 MG PO CAPS
150.0000 mg | ORAL_CAPSULE | Freq: Three times a day (TID) | ORAL | Status: DC
  Administered 2017-11-25 – 2017-11-26 (×4): 150 mg via ORAL
  Filled 2017-11-25 (×4): qty 2

## 2017-11-25 MED ORDER — HYDROCODONE-ACETAMINOPHEN 7.5-325 MG PO TABS
1.0000 | ORAL_TABLET | Freq: Four times a day (QID) | ORAL | Status: DC | PRN
  Administered 2017-11-25 (×2): 1 via ORAL
  Filled 2017-11-25 (×2): qty 1

## 2017-11-25 MED ORDER — POTASSIUM CHLORIDE 10 MEQ/100ML IV SOLN
10.0000 meq | Freq: Once | INTRAVENOUS | Status: AC
  Administered 2017-11-25: 10 meq via INTRAVENOUS
  Filled 2017-11-25: qty 100

## 2017-11-25 MED ORDER — PANTOPRAZOLE SODIUM 40 MG PO TBEC: 40.0000 mg | DELAYED_RELEASE_TABLET | Freq: Every day | ORAL | Status: DC

## 2017-11-25 NOTE — Care Management Note (Signed)
Case Management Note  Patient Details  Name: Meredith [name] MRN: [id number] Date of Birth: October 05, 1950  Subjective/Objective:   Pt in with Delirium. He is from home alone.                  Action/Plan: Plan is for patient to return home when medically ready. MD please order PT/OT evals as needed. CM following.  Expected Discharge Date:                  Expected Discharge Plan:     In-House Referral:     Discharge planning Services     Post Acute Care Choice:    Choice offered to:     DME Arranged:    DME Agency:     HH Arranged:    HH Agency:     Status of Service:  In process, will continue to follow  If discussed at Long Length of Stay Meetings, dates discussed:    Additional Comments:  Pollie Friar, RN 11/25/2017, 12:26 PM

## 2017-11-25 NOTE — Progress Notes (Signed)
Pt. With nausea and vomiting. Physician notified. Zofran ordered and administered.

## 2017-11-25 NOTE — Progress Notes (Signed)
[facility] PROGRESS NOTE  Meredith [name] EYC:[id number] DOB: Jul 21, 1950 DOA: 11/24/2017 PCP: Kathyrn Lass, MD   Narrative: 77 fem Prior [name] stay 6/1-->6/5 depression + hallucination--DR [place] psych PAF, [place] 2- prior 2 ablations --prior on [place]--see PN [name] 11/29/16 Migraines-prior Rx botox and meds at [facility] hypothyroid Prior back surgerires patrial lung lobectomy and [facility] spine surg  Admit [facility] 9/29 MEt encephalopathy in setting recent dental extractions and increasing confusion, poor po intake  Cbc 11, ANC 8,000 B[place] 1.4 CT's of practically whole body = no findings   A & Plan  afib RVR [place] 2-3-[name] primary-Keep on-metoprolol-verapamil/propranolol held rates relatively well controlled-magnesium 2-she is willing to use Eliquis to prevent strokes  Recent dental procedure-supposed to follow-up early next month with dentist-changed to soft diet so she can tolerate the same  Metabolic encephalopathy secondary to poor p.o. intake, possible bipolar-patient seems somewhat clear to me although misses the date, I will have to discuss with her son what is her pre-functional  -Note that patient had a fall 10 to 12 days ago so she may have had a mild concussion?  Hypothyroid-continue Synthroid 112 daily  Chr pain-no pain meds at this time  Migraines-can continue Fioricet limited amount which I prescribed for her she will not be getting a prescription of this on discharge  Bipolar continue Seroquel 200 at bedtime Risperdal 1 daily  Hyperlipidemia continue Pravachol 20 daily  Prior back surgereirews  Hypokalemia-replacing    DVT prophylaxis: Eliquis code Status: Full family Communication: Inpatient disposition Plan: Inpatient   [name], MD  [facility] Direct contact: [phone] --Via amion app OR  --[email or web address]; password [email or web address]  7PM-7AM contact night coverage as above 11/25/2017, 7:30 AM  LOS: 0 days   Consultants:  None  yet  Procedures:  No  Antimicrobials:  Interval history/Subjective: Awake alert pleasant no distress does not seem terribly confused Has not eaten Reports falls   Objective:  Vitals:  Vitals:   11/25/17 0213 11/25/17 0338  BP: (!) 144/70 (!) 143/66  Pulse: 87 91  Resp: 18 12  Temp: 98.7 F (37.1 C) 98.4 F (36.9 C)  SpO2: 99% 97%    Exam:  . Raccooning of both eyes . Poor dentition with no overt purulence in the throat Mallampati 2 . S1-S2 no murmur . Chest clear . Abdomen soft slightly distended . No lower extremity edema . Range of motion intact you 9 euthymic pleasant alert   I have personally reviewed the following:   Labs:  Potassium 3.1 BUN/creatinine 18/0.8 total protein 6.4 calcium 8.8   Medical tests:  Blood cultures are pending  Test discussed with performing physician:  No  Decision to obtain old records:  No  Review and summation of old records:  No  Scheduled Meds: . apixaban  5 mg Oral BID  . levothyroxine  112 mcg Oral QAC breakfast  . metoprolol tartrate  50 mg Oral BID  . pantoprazole  40 mg Oral Daily  . pravastatin  20 mg Oral Daily  . QUEtiapine  200 mg Oral QHS  . risperiDONE  1 mg Oral Daily  . sodium chloride flush  3 mL Intravenous Q12H   Continuous Infusions:  Active Problems:   Delirium   LOS: 0 days

## 2017-11-26 ENCOUNTER — Encounter (HOSPITAL_COMMUNITY): Payer: Self-pay | Admitting: *Deleted

## 2017-11-26 LAB — CBC WITH DIFFERENTIAL/PLATELET
Abs Immature Granulocytes: 0.1 10*3/uL (ref 0.0–0.1)
Basophils Absolute: 0 10*3/uL (ref 0.0–0.1)
Basophils Relative: 0 %
Eosinophils Absolute: 0.2 10*3/uL (ref 0.0–0.7)
Eosinophils Relative: 3 %
HCT: 34.1 % — ABNORMAL LOW (ref 36.0–46.0)
Hemoglobin: 11.3 g/dL — ABNORMAL LOW (ref 12.0–15.0)
Immature Granulocytes: 1 %
Lymphocytes Relative: 18 %
Lymphs Abs: 1.4 10*3/uL (ref 0.7–4.0)
MCH: 30.9 pg (ref 26.0–34.0)
MCHC: 33.1 g/dL (ref 30.0–36.0)
MCV: 93.2 fL (ref 78.0–100.0)
Monocytes Absolute: 0.8 10*3/uL (ref 0.1–1.0)
Monocytes Relative: 10 %
Neutro Abs: 5.2 10*3/uL (ref 1.7–7.7)
Neutrophils Relative %: 68 %
Platelets: 253 10*3/uL (ref 150–400)
RBC: 3.66 MIL/uL — ABNORMAL LOW (ref 3.87–5.11)
RDW: 12.8 % (ref 11.5–15.5)
WBC: 7.6 10*3/uL (ref 4.0–10.5)

## 2017-11-26 LAB — BASIC METABOLIC PANEL
Anion gap: 7 (ref 5–15)
BUN: 10 mg/dL (ref 8–23)
CO2: 23 mmol/L (ref 22–32)
Calcium: 8.5 mg/dL — ABNORMAL LOW (ref 8.9–10.3)
Chloride: 108 mmol/L (ref 98–111)
Creatinine, Ser: 0.73 mg/dL (ref 0.44–1.00)
GFR calc Af Amer: >60 mL/min (ref >60)
GFR calc non Af Amer: >60 mL/min (ref >60)
Glucose, Bld: 97 mg/dL (ref 70–99)
Potassium: 3.1 mmol/L — ABNORMAL LOW (ref 3.5–5.1)
Sodium: 138 mmol/L (ref 135–145)

## 2017-11-26 LAB — MAGNESIUM: Magnesium: 2.1 mg/dL (ref 1.7–2.4)

## 2017-11-26 MED ORDER — QUETIAPINE FUMARATE ER 300 MG PO TB24: 600.0000 mg | ORAL_TABLET | Freq: Every day | ORAL | 0 refills | Status: DC

## 2017-11-26 MED ORDER — PREGABALIN 150 MG PO CAPS: 150.0000 mg | ORAL_CAPSULE | Freq: Three times a day (TID) | ORAL | 0 refills | Status: DC

## 2017-11-26 MED ORDER — LAMOTRIGINE 100 MG PO TABS: 100.0000 mg | ORAL_TABLET | Freq: Every morning | ORAL | 0 refills | Status: DC

## 2017-11-26 MED ORDER — HYDROCORTISONE ACETATE 25 MG RE SUPP: 25.0000 mg | Freq: Two times a day (BID) | RECTAL | 0 refills | Status: DC

## 2017-11-26 MED ORDER — RISPERIDONE 1 MG PO TABS: 1.0000 mg | ORAL_TABLET | Freq: Every day | ORAL | 0 refills | Status: DC

## 2017-11-26 MED ORDER — HYDROCODONE-ACETAMINOPHEN 7.5-325 MG PO TABS: 1.0000 | ORAL_TABLET | Freq: Two times a day (BID) | ORAL | 0 refills | Status: DC | PRN

## 2017-11-26 MED ORDER — APIXABAN 5 MG PO TABS: 5.0000 mg | ORAL_TABLET | Freq: Two times a day (BID) | ORAL | Status: DC

## 2017-11-26 MED ORDER — POTASSIUM CHLORIDE CRYS ER 20 MEQ PO TBCR
40.0000 meq | EXTENDED_RELEASE_TABLET | Freq: Two times a day (BID) | ORAL | Status: DC
  Administered 2017-11-26: 40 meq via ORAL
  Filled 2017-11-26: qty 2

## 2017-11-26 NOTE — Evaluation (Signed)
Physical Therapy Evaluation Patient Details Name: Meredith [name] MRN: [id number] DOB: 1951-01-14 Today's Date: 11/26/2017   History of Present Illness  67 year old female with PMH significant for proximal A. fib, bipolar type II, chronic pain syndrome, anxiety who presents status post fall with altered mental status.  History as above.  DDX includes intracranial bleed, stroke, versus encephalopathy.  Clinical Impression  Orders received for PT evaluation. Patient demonstrates deficits in functional mobility as indicated below. Will benefit from continued skilled PT to address deficits and maximize function. Will see as indicated and progress as tolerated.  At this time, given history of multiple falls at home 3 in less than a week, and current need for assist with activities, feel patient will need 24/7 assist upon discharge, if unable to arrange than recommend SNF.    Follow Up Recommendations Home health PT;Supervision/Assistance - 24 hour(if unable to arrange 24/7 will need rehabilitation)    Equipment Recommendations  Rolling walker with 5" wheels    Recommendations for Other Services       Precautions / Restrictions Precautions Precautions: Fall Restrictions Weight Bearing Restrictions: No      Mobility  Bed Mobility Overal bed mobility: Needs Assistance Bed Mobility: Supine to Sit     Supine to sit: Supervision     General bed mobility comments: supervision for line management  Transfers Overall transfer level: Needs assistance Equipment used: Rolling walker (2 wheeled) Transfers: Sit to/from Stand Sit to Stand: Min assist         General transfer comment: Min assist for power up and stability when elevating to upright  Ambulation/Gait Ambulation/Gait assistance: Min assist Gait Distance (Feet): 30 Feet Assistive device: Rolling walker (2 wheeled);1 person hand held assist Gait Pattern/deviations: Step-through pattern;Decreased stride length;Shuffle;Drifts  right/left;Trunk flexed Gait velocity: decreased   General Gait Details: noted instability with ambulation, increased time and effort for short in room distance, difficulty with navigation around objects. Noted lateral sway  Stairs            Wheelchair Mobility    Modified Rankin (Stroke Patients Only)       Balance Overall balance assessment: Needs assistance;History of Falls   Sitting balance-Leahy Scale: Fair Sitting balance - Comments: able to sit EOB with min guard, denies dizziness   Standing balance support: Bilateral upper extremity supported Standing balance-Leahy Scale: Poor Standing balance comment: relaince on UE support for stability             High level balance activites: Turns High Level Balance Comments: min assist             Pertinent Vitals/Pain Pain Assessment: Faces Faces Pain Scale: Hurts little more Pain Intervention(s): Monitored during session    [place] expects to be discharged to:: Private residence Living Arrangements: Alone Available Help at Discharge: Family;Available PRN/intermittently Type of Home: Apartment Home Access: Level entry     Home Layout: One level Home Equipment: None;Grab bars - tub/shower      Prior Function Level of Independence: Independent         Comments: was recently providing care for a friend      Hand Dominance   Dominant Hand: Right    Extremity/Trunk Assessment   Upper Extremity Assessment Upper Extremity Assessment: Generalized weakness    Lower Extremity Assessment Lower Extremity Assessment: Generalized weakness       Communication      Cognition Arousal/Alertness: Awake/alert Behavior During Therapy: WFL for tasks assessed/performed Overall Cognitive Status: Impaired/Different from baseline Area  of Impairment: Attention;Memory;Following commands;Safety/judgement;Awareness;Problem solving                   Current Attention Level:  Alternating Memory: Decreased short-term memory Following Commands: Follows one step commands consistently;Follows multi-step commands consistently   Awareness: Anticipatory Problem Solving: Slow processing;Requires verbal cues;Requires tactile cues General Comments: Overall modestly slow processessing during session.      General Comments      Exercises     Assessment/Plan    PT Assessment Patient needs continued PT services  PT Problem List Decreased strength;Decreased activity tolerance;Decreased balance;Decreased mobility;Decreased cognition;Pain       PT Treatment Interventions DME instruction;Gait training;Functional mobility training;Therapeutic activities;Therapeutic exercise;Balance training;Neuromuscular re-education;Patient/family education    PT Goals (Current goals can be found in the Care Plan section)  Acute Rehab PT Goals Patient Stated Goal: to feel better PT Goal Formulation: With patient Time For Goal Achievement: 12/10/17 Potential to Achieve Goals: Good    Frequency Min 3X/week   Barriers to discharge        Co-evaluation PT/OT/SLP Co-Evaluation/Treatment: Yes Reason for Co-Treatment: Necessary to address cognition/behavior during functional activity;For patient/therapist safety PT goals addressed during session: Mobility/safety with mobility;Balance OT goals addressed during session: ADL's and self-care       AM-PAC PT "6 Clicks" Daily Activity  Outcome Measure Difficulty turning over in bed (including adjusting bedclothes, sheets and blankets)?: A Little Difficulty moving from lying on back to sitting on the side of the bed? : A Little Difficulty sitting down on and standing up from a chair with arms (e.g., wheelchair, bedside commode, etc,.)?: Unable Help needed moving to and from a bed to chair (including a wheelchair)?: A Little Help needed walking in hospital room?: A Little Help needed climbing 3-5 steps with a railing? : A Lot 6 Click  Score: 15    End of Session Equipment Utilized During Treatment: Gait belt Activity Tolerance: Patient tolerated treatment well Patient left: in chair;with call bell/phone within reach;with chair alarm set Nurse Communication: Mobility status PT Visit Diagnosis: Repeated falls (R29.6);Difficulty in walking, not elsewhere classified (R26.2)    Time: 6195-0932 PT Time Calculation (min) (ACUTE ONLY): 22 min   Charges:   PT Evaluation $PT Eval Moderate Complexity: 1 Mod          Alben Deeds, PT DPT  Board Certified Neurologic Specialist Acute Rehabilitation Services Pager [phone] Office [phone]   Duncan Dull 11/26/2017, 8:35 AM

## 2017-11-26 NOTE — Consult Note (Addendum)
            [facility] CM Primary Care Navigator  11/26/2017  Meredith [name] 11-26-1950 [id number]   Went to see patient at the bedside to identify possible discharge needs but Inpatient CM is in the room talking with patient and family. Was made aware by Inpatient CM that patient will be going home today, will stay with his brother tonight then will start with [facility] tomorrow.  Patient's primary care provider: Dr. Kathyrn Lass with [facility].  Pharmacy: obtains medications from [facility] on [address] and [facility] on [address] without difficulty so far. Patient was provided by Inpatient CM- contact number for the medication manufacturer to seek medication assistance when needed. Patient reports managing her own medications at home straight out of the containers using her own "organizing system".  Patient verbalized that she has been driving prior to admission but [name] will be able to provide transportation to her doctors' appointments after discharge.  Patient lives alone at home and was independent prior to admission. Her brother will be able to provide assistance if needed upon discharge.  Patient voiced understandingto callprimary care provider's office whenshereturnshome,for a post discharge follow-up visit within1- 2 weeksor sooner if needs arise.Patient letter (with PCP's contact number) was provided asareminder.   Explained topatient regarding [facility] CM services available for health management andresourcesat homeand she had indicated having no needs at this time. Patient voicedunderstandingto seekreferral from primary care provider to [facility] care management if deemed necessary and appropriatefor anyservicesin thefuture.   [facility] care management information was provided for futureneeds thatshe may have.  Primary care provider's office is listed as providing transition of care (TOC)  follow-up.   For additional questions please contact:  Edwena Felty A. Ajel, BSN, RN-BC [facility] PRIMARY CARE Navigator Cell: [phone]

## 2017-11-26 NOTE — Care Management Note (Addendum)
Case Management Note  Patient Details  Name: JAYDI BRAY MRN: [id number] Date of Birth: 04-08-1950  Subjective/Objective:                    Action/Plan: Pt discharging today with orders for [facility] services and the recommendations for 24 hour care. CM spoke to the patient about Home first program through [place] and patient was interested. Cory with [facility] notified and accepted the referral. Pt to dc/ home with her brother tonight and then to her home tomorrow with [facility]. Pt with orders for a walker and 3 in 1. James with [facility] notified and will deliver to the room. Pt provided 30 day free card for Eliquis. CM also will fax patient assistance form to [facility] for medication assistance. Pt to f/u with her neurologist for further needs.  Brother to provide transport home.  Expected Discharge Date:  11/26/17               Expected Discharge Plan:  [place]  In-House Referral:     Discharge planning Services  CM Consult, [place]  Post Acute Care Choice:  Durable Medical Equipment Choice offered to:  Patient  DME Arranged:  3-N-1, Walker rolling DME Agency:  [place]:  RN, PT, OT, Social Work [place] Agency:  [facility](Home first)  Status of Service:  Completed, signed off  If discussed at [employer], dates discussed:    Additional Comments:  Pollie Friar, RN 11/26/2017, 3:54 PM

## 2017-11-26 NOTE — Discharge Summary (Signed)
Physician Discharge Summary  Meredith [name] WIO:[id number] DOB: [birth year] DOA: 11/24/2017  PCP: Kathyrn Lass, MD  Admit date: 11/24/2017 Discharge date: 11/26/2017  Time spent: 40 minutes  Recommendations for Outpatient Follow-up:  1. Initiated apixaban 5 twice daily this admission please note medication changes to beta-blockers and others as below 2. Will need outpatient management by [name] of pain management and refills of meds per him 3. Recommend reduction of polypharmacy if possible as an outpatient by the psychiatrist or primary care 4. Needs complete blood count and Chem-7 1 week 5. Ordered home PT OT rolling walker and equipment-if able we will try to get home first by [facility]  Discharge Diagnoses:  Active Problems:   Delirium   Falls   Disorientation   Discharge Condition: Improved  Diet recommendation: Heart healthy low-salt  Filed Weights   11/24/17 1542 11/25/17 0213  Weight: 70.3 kg 86.9 kg    History of present illness:  64 fem Prior [facility] stay 6/1-->6/5 depression + hallucination--DR [facility] psych PAF, [place] 2- prior 2 ablations --prior on Savasya--see PN [name] 11/29/16 Migraines-prior Rx botox and meds at [facility] hypothyroid Prior back surgerires patrial lung lobectomy and [facility] spine surg  Admit [facility] 9/29 MEt encephalopathy in setting recent dental extractions and increasing confusion, poor po intake  Cbc 11, ANC 8,000 Bili 1.4 CT's of practically whole body = no findings   Hospital Course:   afib RVR [place] 2-3-[name] primary-Keep on-metoprolol-verapamil resumed on discharge- well controlled-magnesium 2-she is willing to use blood thinner and was started on apixaban 5 twice daily on discharge  Recent dental procedure-supposed to follow-up early next month with dentist-changed to soft diet so she can tolerate the same  Metabolic encephalopathy secondary to poor p.o. intake, possible bipolar-patient mentation cleared up rapidly overnight 30/10 1-Note  that patient had a fall 10 to 12 days ago so she may have had a mild concussion?-I feel she is safe for the blood thinner given she is 10 to 12 days out from this time  Hypothyroid-continue Synthroid 112 daily  Chr pain-no pain meds at this time  Migraines-Fioricet in the hospital-no further meds were given on discharge  Bipolar continue Seroquel 200 at bedtime Risperdal 1 daily  Hyperlipidemia continue Pravachol 20 daily  Prior back surgeries was with pain management Dr. Diamond Nickel outpatient screening  Hypokalemia-replacing   Procedures:  Multiple CTs  Consultations:  None  Discharge Exam: Vitals:   11/26/17 0805 11/26/17 1226  BP: 129/64   Pulse: 93   Resp: 19   Temp: 98.4 F (36.9 C) 98.4 F (36.9 C)  SpO2: 100%     General: Awake alert pleasant much more coherent can answer numerous questions out of the MMSE without issue Cardiovascular: S1-S2 slightly tachycardic A. fib but relatively rate controlled on monitors below 100 Respiratory: Chest clear Abdomen soft nontender no rebound no guarding  Discharge Instructions   Discharge Instructions    Diet - low sodium heart healthy   Complete by:  As directed    Discharge instructions   Complete by:  As directed    Please make sure you take your medications as recommended-you have been started on a blood thinner because you have atrial fibrillation and may benefit from blood checks periodically for the same Please follow-up with your pain management office and get refills as appropriate for your low back pain you will not be prescribed any of those meds in the hospital   Increase activity slowly   Complete by:  As directed  Allergies as of 11/26/2017      Reactions   Codeine Anaphylaxis   Patient states she can take codeine but not percocet   Celebrex [celecoxib] Hives   Sulfa Antibiotics Hives   Tricyclic Antidepressants    Doesn't help   Amoxicillin Rash   Ampicillin Rash   Penicillins Rash       Medication List    STOP taking these medications   clotrimazole 1 % cream Commonly known as:  LOTRIMIN   pantoprazole 40 MG tablet Commonly known as:  PROTONIX   propranolol 10 MG tablet Commonly known as:  INDERAL     TAKE these medications   AIMOVIG 70 MG/ML Soaj Generic drug:  Erenumab-aooe Inject 70 mg into the skin every 30 (thirty) days.   apixaban 5 MG Tabs tablet Commonly known as:  ELIQUIS Take 1 tablet (5 mg total) by mouth 2 (two) times daily.   aspirin 325 MG tablet Take 325 mg by mouth daily.   calcium carbonate 1250 (500 Ca) MG tablet Commonly known as:  OS-CAL - dosed in mg of elemental calcium Take 1 tablet (500 mg of elemental calcium total) by mouth daily. For bone health   diphenoxylate-atropine 2.5-0.025 MG tablet Commonly known as:  LOMOTIL Take 1 tablet by mouth 4 (four) times daily as needed for diarrhea or loose stools.   esomeprazole 40 MG capsule Commonly known as:  NEXIUM Take 1 capsule (40 mg total) by mouth daily as needed. For acid reflux   estradiol 1 MG tablet Commonly known as:  ESTRACE Take 1.5 tablets (1.5 mg total) by mouth daily. For hormone replacement   ferrous sulfate 325 (65 FE) MG tablet Take 1 tablet (325 mg total) by mouth daily with breakfast. Ferrous sulfate   HYDROcodone-acetaminophen 7.5-325 MG tablet Commonly known as:  NORCO Take 1 tablet by mouth 2 (two) times daily as needed. What changed:  reasons to take this   hydrocortisone 25 MG suppository Commonly known as:  ANUSOL-HC Place 1 suppository (25 mg total) rectally 2 (two) times daily.   hydrOXYzine 50 MG tablet Commonly known as:  ATARAX/VISTARIL Take 1 tablet (50 mg total) by mouth 3 (three) times daily as needed for anxiety.   lamoTRIgine 100 MG tablet Commonly known as:  LAMICTAL Take 1 tablet (100 mg total) by mouth every morning.   levothyroxine 112 MCG tablet Commonly known as:  SYNTHROID, LEVOTHROID Take 1 tablet (112 mcg total) by mouth  daily before breakfast. For low thyroid hormone   Melatonin 5 MG Tabs Take 30 mg by mouth at bedtime.   metoprolol succinate 50 MG 24 hr tablet Commonly known as:  TOPROL-XL Take 1 tablet (50 mg total) by mouth daily. Take with or immediately following a meal.   ondansetron 4 MG tablet Commonly known as:  ZOFRAN Take 1 tablet (4 mg total) by mouth 3 (three) times daily as needed. As needed for nausea   pravastatin 20 MG tablet Commonly known as:  PRAVACHOL Take 20 mg by mouth daily.   pregabalin 150 MG capsule Commonly known as:  LYRICA Take 1 capsule (150 mg total) by mouth 3 (three) times daily. For nerve pain   QUEtiapine 300 MG 24 hr tablet Commonly known as:  SEROQUEL XR Take 2 tablets (600 mg total) by mouth at bedtime.   risperiDONE 1 MG tablet Commonly known as:  RISPERDAL Take 1 tablet (1 mg total) by mouth daily.   senna-docusate 8.6-50 MG tablet Commonly known as:  Senokot-S Take 2 tablets  by mouth 2 (two) times daily. For constipation   verapamil 120 MG 24 hr capsule Commonly known as:  VERELAN PM Take 1 capsule (120 mg total) by mouth at bedtime. For high blood pressure   Vitamin D3 1000 units Caps Take 1,000 Units by mouth daily.            Durable Medical Equipment  (From admission, onward)         Start     Ordered   11/26/17 1318  DME 3-in-1  Once     11/26/17 1321   11/26/17 1318  For home use only DME Walker rolling  [facility])  Once    Question:  Patient needs a walker to treat with the following condition  Answer:  Debility   11/26/17 1321   11/26/17 1118  For home use only DME 3 n 1  Once     11/26/17 1117   11/26/17 1118  For home use only DME Walker rolling  Once    Question:  Patient needs a walker to treat with the following condition  Answer:  Weakness   11/26/17 1117         Allergies  Allergen Reactions  . Codeine Anaphylaxis    Patient states she can take codeine but not percocet   . Celebrex [Celecoxib] Hives  .  Sulfa Antibiotics Hives  . Tricyclic Antidepressants     Doesn't help  . Amoxicillin Rash  . Ampicillin Rash  . Penicillins Rash      The results of significant diagnostics from this hospitalization (including imaging, microbiology, ancillary and laboratory) are listed below for reference.    Significant Diagnostic Studies: Ct Head Wo Contrast  Result Date: 11/24/2017 CLINICAL DATA:  Altered mental status, facial bruising after fall 9 days ago. EXAM: CT HEAD WITHOUT CONTRAST CT MAXILLOFACIAL WITHOUT CONTRAST CT CERVICAL SPINE WITHOUT CONTRAST TECHNIQUE: Multidetector CT imaging of the head, cervical spine, and maxillofacial structures were performed using the standard protocol without intravenous contrast. Multiplanar CT image reconstructions of the cervical spine and maxillofacial structures were also generated. COMPARISON:  None. FINDINGS: CT HEAD FINDINGS Brain: Mild chronic ischemic white matter disease is noted. No mass effect or midline shift is noted. Ventricular size is within normal limits. There is no evidence of mass lesion, hemorrhage or acute infarction. Vascular: No hyperdense vessel or unexpected calcification. Skull: Normal. Negative for fracture or focal lesion. Other: None. CT MAXILLOFACIAL FINDINGS Osseous: No fracture or mandibular dislocation. No destructive process. Orbits: Negative. No traumatic or inflammatory finding. Sinuses: Mild right maxillary mucosal thickening is noted. Remaining paranasal sinuses are unremarkable. Soft tissues: Negative. CT CERVICAL SPINE FINDINGS Alignment: Normal. Skull base and vertebrae: No acute fracture. No primary bone lesion or focal pathologic process. Soft tissues and spinal canal: No prevertebral fluid or swelling. No visible canal hematoma. Disc levels:  None. Upper chest: Negative. Other: None. IMPRESSION: Mild chronic ischemic white matter disease. No acute intracranial abnormality seen. Mild right maxillary sinus mucosal thickening is  noted. No acute abnormality seen in the maxillofacial region. Normal cervical spine. Electronically Signed   By: Marijo Conception, M.D.   On: 11/24/2017 18:58   Ct Chest W Contrast  Result Date: 11/24/2017 CLINICAL DATA:  Acute mental status changes. Fall 9 days ago. Abdominal blunt trauma. EXAM: CT CHEST, ABDOMEN, AND PELVIS WITH CONTRAST TECHNIQUE: Multidetector CT imaging of the chest, abdomen and pelvis was performed following the standard protocol during bolus administration of intravenous contrast. CONTRAST:  163m OMNIPAQUE IOHEXOL 300 MG/ML  SOLN COMPARISON:  None. FINDINGS: CT CHEST FINDINGS Cardiovascular: The thoracic aorta is normal with no atherosclerosis, aneurysm, or dissection. The central pulmonary artery is normal. No filling defects in the visualized opacified branches of pulmonary artery. Mediastinum/Nodes: There is a tiny hiatal hernia. The esophagus and thyroid are unremarkable. The chest wall is without obvious abnormality. No adenopathy or effusion. Lungs/Pleura: Atelectasis in the anterior right upper lobe. No pneumothorax. Central airways are normal. No pulmonary nodules, masses, or focal infiltrates. Musculoskeletal: Pedicle rods and screws are seen in the thoracic spine. No hardware failure identified. No rib fractures noted. No other fracture seen within the thorax. CT ABDOMEN PELVIS FINDINGS Hepatobiliary: A few tiny lesions in the liver are too small to characterize but almost certainly cysts or another benign etiology. Hepatic steatosis. The portal vein is patent. The gallbladder is unremarkable. Pancreas: Unremarkable. No pancreatic ductal dilatation or surrounding inflammatory changes. Spleen: Normal in size without focal abnormality. Adrenals/Urinary Tract: Adrenal glands are unremarkable. Kidneys are normal, without renal calculi, focal lesion, or hydronephrosis. Bladder is unremarkable. Stomach/Bowel: Stomach is within normal limits. Appendix appears normal. No evidence of  bowel wall thickening, distention, or inflammatory changes. Vascular/Lymphatic: No significant vascular findings are present. No enlarged abdominal or pelvic lymph nodes. Reproductive: Status post hysterectomy. No adnexal masses. Other: No abdominal wall hernia or abnormality. No abdominopelvic ascites. Musculoskeletal: No acute or significant osseous findings. IMPRESSION: 1. No soft tissue or bony injury identified. 2. No other significant abnormalities. Electronically Signed   By: Dorise Bullion III M.D   On: 11/24/2017 18:49   Ct Cervical Spine Wo Contrast  Result Date: 11/24/2017 CLINICAL DATA:  Altered mental status, facial bruising after fall 9 days ago. EXAM: CT HEAD WITHOUT CONTRAST CT MAXILLOFACIAL WITHOUT CONTRAST CT CERVICAL SPINE WITHOUT CONTRAST TECHNIQUE: Multidetector CT imaging of the head, cervical spine, and maxillofacial structures were performed using the standard protocol without intravenous contrast. Multiplanar CT image reconstructions of the cervical spine and maxillofacial structures were also generated. COMPARISON:  None. FINDINGS: CT HEAD FINDINGS Brain: Mild chronic ischemic white matter disease is noted. No mass effect or midline shift is noted. Ventricular size is within normal limits. There is no evidence of mass lesion, hemorrhage or acute infarction. Vascular: No hyperdense vessel or unexpected calcification. Skull: Normal. Negative for fracture or focal lesion. Other: None. CT MAXILLOFACIAL FINDINGS Osseous: No fracture or mandibular dislocation. No destructive process. Orbits: Negative. No traumatic or inflammatory finding. Sinuses: Mild right maxillary mucosal thickening is noted. Remaining paranasal sinuses are unremarkable. Soft tissues: Negative. CT CERVICAL SPINE FINDINGS Alignment: Normal. Skull base and vertebrae: No acute fracture. No primary bone lesion or focal pathologic process. Soft tissues and spinal canal: No prevertebral fluid or swelling. No visible canal  hematoma. Disc levels:  None. Upper chest: Negative. Other: None. IMPRESSION: Mild chronic ischemic white matter disease. No acute intracranial abnormality seen. Mild right maxillary sinus mucosal thickening is noted. No acute abnormality seen in the maxillofacial region. Normal cervical spine. Electronically Signed   By: Marijo Conception, M.D.   On: 11/24/2017 18:58   Ct Abdomen Pelvis W Contrast  Result Date: 11/24/2017 CLINICAL DATA:  Acute mental status changes. Fall 9 days ago. Abdominal blunt trauma. EXAM: CT CHEST, ABDOMEN, AND PELVIS WITH CONTRAST TECHNIQUE: Multidetector CT imaging of the chest, abdomen and pelvis was performed following the standard protocol during bolus administration of intravenous contrast. CONTRAST:  154m OMNIPAQUE IOHEXOL 300 MG/ML  SOLN COMPARISON:  None. FINDINGS: CT CHEST FINDINGS Cardiovascular: The thoracic aorta is  normal with no atherosclerosis, aneurysm, or dissection. The central pulmonary artery is normal. No filling defects in the visualized opacified branches of pulmonary artery. Mediastinum/Nodes: There is a tiny hiatal hernia. The esophagus and thyroid are unremarkable. The chest wall is without obvious abnormality. No adenopathy or effusion. Lungs/Pleura: Atelectasis in the anterior right upper lobe. No pneumothorax. Central airways are normal. No pulmonary nodules, masses, or focal infiltrates. Musculoskeletal: Pedicle rods and screws are seen in the thoracic spine. No hardware failure identified. No rib fractures noted. No other fracture seen within the thorax. CT ABDOMEN PELVIS FINDINGS Hepatobiliary: A few tiny lesions in the liver are too small to characterize but almost certainly cysts or another benign etiology. Hepatic steatosis. The portal vein is patent. The gallbladder is unremarkable. Pancreas: Unremarkable. No pancreatic ductal dilatation or surrounding inflammatory changes. Spleen: Normal in size without focal abnormality. Adrenals/Urinary Tract:  Adrenal glands are unremarkable. Kidneys are normal, without renal calculi, focal lesion, or hydronephrosis. Bladder is unremarkable. Stomach/Bowel: Stomach is within normal limits. Appendix appears normal. No evidence of bowel wall thickening, distention, or inflammatory changes. Vascular/Lymphatic: No significant vascular findings are present. No enlarged abdominal or pelvic lymph nodes. Reproductive: Status post hysterectomy. No adnexal masses. Other: No abdominal wall hernia or abnormality. No abdominopelvic ascites. Musculoskeletal: No acute or significant osseous findings. IMPRESSION: 1. No soft tissue or bony injury identified. 2. No other significant abnormalities. Electronically Signed   By: Dorise Bullion III M.D   On: 11/24/2017 18:49   Ct Maxillofacial Wo Contrast  Result Date: 11/24/2017 CLINICAL DATA:  Altered mental status, facial bruising after fall 9 days ago. EXAM: CT HEAD WITHOUT CONTRAST CT MAXILLOFACIAL WITHOUT CONTRAST CT CERVICAL SPINE WITHOUT CONTRAST TECHNIQUE: Multidetector CT imaging of the head, cervical spine, and maxillofacial structures were performed using the standard protocol without intravenous contrast. Multiplanar CT image reconstructions of the cervical spine and maxillofacial structures were also generated. COMPARISON:  None. FINDINGS: CT HEAD FINDINGS Brain: Mild chronic ischemic white matter disease is noted. No mass effect or midline shift is noted. Ventricular size is within normal limits. There is no evidence of mass lesion, hemorrhage or acute infarction. Vascular: No hyperdense vessel or unexpected calcification. Skull: Normal. Negative for fracture or focal lesion. Other: None. CT MAXILLOFACIAL FINDINGS Osseous: No fracture or mandibular dislocation. No destructive process. Orbits: Negative. No traumatic or inflammatory finding. Sinuses: Mild right maxillary mucosal thickening is noted. Remaining paranasal sinuses are unremarkable. Soft tissues: Negative. CT  CERVICAL SPINE FINDINGS Alignment: Normal. Skull base and vertebrae: No acute fracture. No primary bone lesion or focal pathologic process. Soft tissues and spinal canal: No prevertebral fluid or swelling. No visible canal hematoma. Disc levels:  None. Upper chest: Negative. Other: None. IMPRESSION: Mild chronic ischemic white matter disease. No acute intracranial abnormality seen. Mild right maxillary sinus mucosal thickening is noted. No acute abnormality seen in the maxillofacial region. Normal cervical spine. Electronically Signed   By: Marijo Conception, M.D.   On: 11/24/2017 18:58    Microbiology: Recent Results (from the past 240 hour(s))  Culture, blood (routine x 2)     Status: None (Preliminary result)   Collection Time: 11/24/17 10:37 PM  Result Value Ref Range Status   Specimen Description BLOOD RIGHT ANTECUBITAL  Final   Special Requests   Final    BOTTLES DRAWN AEROBIC AND ANAEROBIC Blood Culture adequate volume   Culture   Final    NO GROWTH 2 DAYS Performed at [facility], [address],  [place] [address]    Report Status PENDING  Incomplete  Culture, blood (routine x 2)     Status: None (Preliminary result)   Collection Time: 11/24/17 10:46 PM  Result Value Ref Range Status   Specimen Description BLOOD LEFT ANTECUBITAL  Final   Special Requests   Final    BOTTLES DRAWN AEROBIC AND ANAEROBIC Blood Culture results may not be optimal due to an excessive volume of blood received in culture bottles   Culture   Final    NO GROWTH 2 DAYS Performed at [facility], [place] [address], [place], [place] [address]    Report Status PENDING  Incomplete     Labs: Basic Metabolic Panel: Recent Labs  Lab 11/24/17 1601 11/24/17 1612 11/25/17 0702 11/26/17 0546  NA 137 136 136 138  K 3.4* 4.1 3.1* 3.1*  CL 100 103 105 108  CO2 21*  --  20* 23  GLUCOSE 93 96 100* 97  BUN 26* 41* 18 10  CREATININE 1.05* 0.90 0.83 0.73  CALCIUM 9.7  --  8.8* 8.5*  MG  --   --   2.0 2.1   Liver Function Tests: Recent Labs  Lab 11/24/17 1601 11/25/17 0702  AST 33 24  ALT 24 18  ALKPHOS 53 48  BILITOT 1.4* 1.0  PROT 7.3 6.4*  ALBUMIN 4.4 3.5   No results for input(s): LIPASE, AMYLASE in the last 168 hours. Recent Labs  Lab 11/24/17 2014  AMMONIA 15   CBC: Recent Labs  Lab 11/24/17 1601 11/24/17 1612 11/25/17 0702 11/26/17 0546  WBC 11.1*  --  10.1 7.6  NEUTROABS 8.0*  --   --  5.2  HGB 13.9 14.3 13.3 11.3*  HCT 42.1 42.0 39.3 34.1*  MCV 94.4  --  92.3 93.2  PLT 350  --  274 253   Cardiac Enzymes: Recent Labs  Lab 11/24/17 1554  CKTOTAL 359*  CKMB 6.5*   BNP: BNP (last 3 results) No results for input(s): BNP in the last 8760 hours.  ProBNP (last 3 results) No results for input(s): PROBNP in the last 8760 hours.  CBG: Recent Labs  Lab 11/24/17 1606 11/25/17 0741  GLUCAP 93 102*       Signed:  Nita Sells MD   [facility] 11/26/2017, 1:21 PM

## 2017-11-26 NOTE — Care Management (Signed)
#    3  S/W  MAVRICK   @ [employer] [facility] # [phone]   ELIQUIS  5 MG BID COVER- YES CO-PAY- $ 45.00 TIER- 3 DRUG PRIOR APPROVAL- NO  PREFERRED PHARMACY :  YES   [facility]

## 2017-11-26 NOTE — Evaluation (Signed)
Occupational Therapy Evaluation Patient Details Name: Meredith [name] MRN: [id number] DOB: October 07, 1950 Today's Date: 11/26/2017    History of Present Illness 67 year old female with PMH significant for proximal A. fib, bipolar type II, chronic pain syndrome, anxiety who presents status post fall with altered mental status.  History as above.  DDX includes intracranial bleed, stroke, versus encephalopathy.   Clinical Impression   This 67 y/o female presents with the above. At baseline pt lives alone, reports independence with ADLs, iADLs and functional mobility. Pt reports recent increase in number of falls at home (x3) PTA. Pt requiring minA for functional mobility using RW this session. Currently requires minA for toileting, standing grooming and LB ADLs. Pt presenting with generalized weakness, decreased activity tolerance and dynamic balance, requires increased time for processing and following 1-2 step commands. Pt will benefit from continued acute OT services and recommend follow up [place] therapy services at time of discharge pending pt has 24hr supervision/assist at home. If 24hr unavailable may need to consider SNF (Pt may also benefit from [place] with [facility] if eligible). Will follow.     Follow Up Recommendations  Home health OT;Supervision/Assistance - 24 hour(vs SNF; may be good candidate for Home First program )    Equipment Recommendations  3 in 1 bedside commode           Precautions / Restrictions Precautions Precautions: Fall Restrictions Weight Bearing Restrictions: No      Mobility Bed Mobility Overal bed mobility: Needs Assistance Bed Mobility: Supine to Sit     Supine to sit: Supervision     General bed mobility comments: supervision for line management  Transfers Overall transfer level: Needs assistance Equipment used: Rolling walker (2 wheeled) Transfers: Sit to/from Stand Sit to Stand: Min assist         General transfer comment: Min  assist for power up and stability when elevating to upright    Balance Overall balance assessment: Needs assistance;History of Falls   Sitting balance-Leahy Scale: Fair Sitting balance - Comments: able to sit EOB with min guard, denies dizziness   Standing balance support: Bilateral upper extremity supported Standing balance-Leahy Scale: Poor Standing balance comment: reliance on UE support for stability             High level balance activites: Turns High Level Balance Comments: min assist           ADL either performed or assessed with clinical judgement   ADL Overall ADL's : Needs assistance/impaired Eating/Feeding: Independent;Sitting   Grooming: Minimal assistance;Standing;Wash/dry hands Grooming Details (indicate cue type and reason): minA standing balance  Upper Body Bathing: Min guard;Sitting   Lower Body Bathing: Minimal assistance;Sit to/from stand   Upper Body Dressing : Min guard;Sitting   Lower Body Dressing: Sit to/from stand;Moderate assistance   Toilet Transfer: Minimal assistance;Ambulation;Regular Toilet;Grab bars;RW   Toileting- Clothing Manipulation and Hygiene: Minimal assistance;Sit to/from stand Toileting - Clothing Manipulation Details (indicate cue type and reason): assist for gown management; pt performing peri-care using lateral leans      Functional mobility during ADLs: Minimal assistance;Rolling walker General ADL Comments: pt with slow movements, requiring increased time to perform functional tasks and mobility      Vision   Additional Comments: pt with some swelling, bruising around eyes from recent fall     Perception     Praxis      Pertinent Vitals/Pain Pain Assessment: Faces Faces Pain Scale: Hurts little more Pain Location: generalized Pain Intervention(s): Monitored during session  Hand Dominance Right   Extremity/Trunk Assessment Upper Extremity Assessment Upper Extremity Assessment: Generalized weakness    Lower Extremity Assessment Lower Extremity Assessment: Defer to PT evaluation       Communication     Cognition Arousal/Alertness: Awake/alert Behavior During Therapy: WFL for tasks assessed/performed Overall Cognitive Status: Impaired/Different from baseline Area of Impairment: Attention;Memory;Following commands;Safety/judgement;Awareness;Problem solving                   Current Attention Level: Alternating Memory: Decreased short-term memory Following Commands: Follows one step commands consistently;Follows multi-step commands consistently   Awareness: Anticipatory Problem Solving: Slow processing;Requires verbal cues;Requires tactile cues General Comments: Overall modestly slow processessing during session.   General Comments       Exercises     Shoulder Instructions      Home Living Family/patient expects to be discharged to:: Private residence Living Arrangements: Alone Available Help at Discharge: Family;Available PRN/intermittently Type of Home: Apartment Home Access: Level entry     Home Layout: One level     Bathroom Shower/Tub: Tub/shower unit;Curtain   Biochemist, clinical: Standard     Home Equipment: None;Grab bars - tub/shower          Prior Functioning/Environment Level of Independence: Independent        Comments: was recently providing care for a friend         OT Problem List: Decreased strength;Decreased range of motion;Decreased activity tolerance;Impaired balance (sitting and/or standing);Decreased knowledge of use of DME or AE      OT Treatment/Interventions: Self-care/ADL training;Therapeutic exercise;Energy conservation;DME and/or AE instruction;Therapeutic activities;Patient/family education;Balance training    OT Goals(Current goals can be found in the care plan section) Acute Rehab OT Goals Patient Stated Goal: to feel better OT Goal Formulation: With patient Time For Goal Achievement: 12/10/17 Potential to Achieve  Goals: Good  OT Frequency: Min 2X/week   Barriers to D/C: Decreased caregiver support  unsure of 24hr assist available at time of discharge       Co-evaluation PT/OT/SLP Co-Evaluation/Treatment: Yes Reason for Co-Treatment: Necessary to address cognition/behavior during functional activity;For patient/therapist safety PT goals addressed during session: Mobility/safety with mobility;Balance OT goals addressed during session: ADL's and self-care      AM-PAC PT "6 Clicks" Daily Activity     Outcome Measure Help from another person eating meals?: None Help from another person taking care of personal grooming?: A Little Help from another person toileting, which includes using toliet, bedpan, or urinal?: A Little Help from another person bathing (including washing, rinsing, drying)?: A Little Help from another person to put on and taking off regular upper body clothing?: None Help from another person to put on and taking off regular lower body clothing?: A Lot 6 Click Score: 19   End of Session Equipment Utilized During Treatment: Gait belt;Rolling walker Nurse Communication: Mobility status  Activity Tolerance: Patient tolerated treatment well Patient left: in chair;with call bell/phone within reach;with chair alarm set  OT Visit Diagnosis: Muscle weakness (generalized) (M62.81)                Time: 6948-5462 OT Time Calculation (min): 22 min Charges:  OT General Charges $OT Visit: 1 Visit OT Evaluation $OT Eval Moderate Complexity: 1 Mod  Lou Cal, [place] Pager [phone] Office [phone]  Raymondo Band 11/26/2017, 10:32 AM

## 2017-11-26 NOTE — Discharge Instructions (Signed)

## 2017-11-27 ENCOUNTER — Other Ambulatory Visit: Payer: Self-pay | Admitting: *Deleted

## 2017-11-27 ENCOUNTER — Ambulatory Visit: Payer: Medicare Other | Admitting: Psychology

## 2017-11-27 DIAGNOSIS — G9341 Metabolic encephalopathy: Secondary | ICD-10-CM | POA: Diagnosis not present

## 2017-11-27 DIAGNOSIS — F319 Bipolar disorder, unspecified: Secondary | ICD-10-CM | POA: Diagnosis not present

## 2017-11-27 NOTE — Patient Outreach (Signed)
Confirmed with Tommi Rumps with [facility] that Ms. Widmer enrolled in the [place] program.  Telephone call made to Ms. Panozzo to make her aware that [facility] [facility] could assist with transportation to MD appointments while on the [place] program. Also discussed that [place] [facility] could potentially receive referral if community case management needs are identified post [place]. Ms. Jeremiah expressed appreciation of the call.  Will notify [place] [facility] office of [facility] enrollment.   Marthenia Rolling, MSN-Ed, RN,BSN [facility] Liaison [phone]

## 2017-11-28 DIAGNOSIS — R350 Frequency of micturition: Secondary | ICD-10-CM | POA: Diagnosis not present

## 2017-11-28 DIAGNOSIS — N3001 Acute cystitis with hematuria: Secondary | ICD-10-CM | POA: Diagnosis not present

## 2017-11-28 DIAGNOSIS — G9341 Metabolic encephalopathy: Secondary | ICD-10-CM | POA: Diagnosis not present

## 2017-11-28 DIAGNOSIS — E876 Hypokalemia: Secondary | ICD-10-CM | POA: Diagnosis not present

## 2017-11-28 DIAGNOSIS — F319 Bipolar disorder, unspecified: Secondary | ICD-10-CM | POA: Diagnosis not present

## 2017-11-29 ENCOUNTER — Encounter: Payer: Self-pay | Admitting: Behavioral Health

## 2017-11-29 ENCOUNTER — Ambulatory Visit: Payer: Self-pay | Admitting: Cardiovascular Disease

## 2017-11-29 DIAGNOSIS — F4310 Post-traumatic stress disorder, unspecified: Secondary | ICD-10-CM

## 2017-11-29 DIAGNOSIS — G9341 Metabolic encephalopathy: Secondary | ICD-10-CM | POA: Diagnosis not present

## 2017-11-29 DIAGNOSIS — F319 Bipolar disorder, unspecified: Secondary | ICD-10-CM | POA: Diagnosis not present

## 2017-11-29 LAB — CULTURE, BLOOD (ROUTINE X 2)
Culture: NO GROWTH
Culture: NO GROWTH
Special Requests: ADEQUATE

## 2017-12-02 ENCOUNTER — Other Ambulatory Visit: Payer: Self-pay | Admitting: Cardiovascular Disease

## 2017-12-02 DIAGNOSIS — F319 Bipolar disorder, unspecified: Secondary | ICD-10-CM | POA: Diagnosis not present

## 2017-12-02 DIAGNOSIS — G9341 Metabolic encephalopathy: Secondary | ICD-10-CM | POA: Diagnosis not present

## 2017-12-03 DIAGNOSIS — G518 Other disorders of facial nerve: Secondary | ICD-10-CM | POA: Diagnosis not present

## 2017-12-03 DIAGNOSIS — R51 Headache: Secondary | ICD-10-CM | POA: Diagnosis not present

## 2017-12-03 DIAGNOSIS — M542 Cervicalgia: Secondary | ICD-10-CM | POA: Diagnosis not present

## 2017-12-03 DIAGNOSIS — G43719 Chronic migraine without aura, intractable, without status migrainosus: Secondary | ICD-10-CM | POA: Diagnosis not present

## 2017-12-03 DIAGNOSIS — F319 Bipolar disorder, unspecified: Secondary | ICD-10-CM | POA: Diagnosis not present

## 2017-12-03 DIAGNOSIS — G9341 Metabolic encephalopathy: Secondary | ICD-10-CM | POA: Diagnosis not present

## 2017-12-03 DIAGNOSIS — M791 Myalgia, unspecified site: Secondary | ICD-10-CM | POA: Diagnosis not present

## 2017-12-04 ENCOUNTER — Ambulatory Visit: Payer: Medicare Other | Admitting: Psychology

## 2017-12-05 DIAGNOSIS — F319 Bipolar disorder, unspecified: Secondary | ICD-10-CM | POA: Diagnosis not present

## 2017-12-05 DIAGNOSIS — G9341 Metabolic encephalopathy: Secondary | ICD-10-CM | POA: Diagnosis not present

## 2017-12-06 DIAGNOSIS — F319 Bipolar disorder, unspecified: Secondary | ICD-10-CM | POA: Diagnosis not present

## 2017-12-06 DIAGNOSIS — G9341 Metabolic encephalopathy: Secondary | ICD-10-CM | POA: Diagnosis not present

## 2017-12-09 DIAGNOSIS — G894 Chronic pain syndrome: Secondary | ICD-10-CM | POA: Diagnosis not present

## 2017-12-09 DIAGNOSIS — I480 Paroxysmal atrial fibrillation: Secondary | ICD-10-CM | POA: Diagnosis not present

## 2017-12-09 DIAGNOSIS — K219 Gastro-esophageal reflux disease without esophagitis: Secondary | ICD-10-CM | POA: Diagnosis not present

## 2017-12-09 DIAGNOSIS — E7800 Pure hypercholesterolemia, unspecified: Secondary | ICD-10-CM | POA: Diagnosis not present

## 2017-12-09 DIAGNOSIS — R410 Disorientation, unspecified: Secondary | ICD-10-CM | POA: Diagnosis not present

## 2017-12-09 DIAGNOSIS — W19XXXD Unspecified fall, subsequent encounter: Secondary | ICD-10-CM | POA: Diagnosis not present

## 2017-12-09 DIAGNOSIS — Z6836 Body mass index (BMI) 36.0-36.9, adult: Secondary | ICD-10-CM | POA: Diagnosis not present

## 2017-12-09 DIAGNOSIS — F323 Major depressive disorder, single episode, severe with psychotic features: Secondary | ICD-10-CM | POA: Diagnosis not present

## 2017-12-09 DIAGNOSIS — E039 Hypothyroidism, unspecified: Secondary | ICD-10-CM | POA: Diagnosis not present

## 2017-12-10 DIAGNOSIS — F319 Bipolar disorder, unspecified: Secondary | ICD-10-CM | POA: Diagnosis not present

## 2017-12-10 DIAGNOSIS — G9341 Metabolic encephalopathy: Secondary | ICD-10-CM | POA: Diagnosis not present

## 2017-12-11 ENCOUNTER — Ambulatory Visit (INDEPENDENT_AMBULATORY_CARE_PROVIDER_SITE_OTHER): Payer: Medicare Other | Admitting: Psychology

## 2017-12-11 DIAGNOSIS — F3181 Bipolar II disorder: Secondary | ICD-10-CM

## 2017-12-12 ENCOUNTER — Ambulatory Visit (INDEPENDENT_AMBULATORY_CARE_PROVIDER_SITE_OTHER): Payer: Medicare Other | Admitting: Psychiatry

## 2017-12-12 DIAGNOSIS — F411 Generalized anxiety disorder: Secondary | ICD-10-CM | POA: Diagnosis not present

## 2017-12-12 DIAGNOSIS — S060X0D Concussion without loss of consciousness, subsequent encounter: Secondary | ICD-10-CM | POA: Diagnosis not present

## 2017-12-12 DIAGNOSIS — F319 Bipolar disorder, unspecified: Secondary | ICD-10-CM | POA: Diagnosis not present

## 2017-12-12 DIAGNOSIS — F4310 Post-traumatic stress disorder, unspecified: Secondary | ICD-10-CM | POA: Diagnosis not present

## 2017-12-12 DIAGNOSIS — G9341 Metabolic encephalopathy: Secondary | ICD-10-CM | POA: Diagnosis not present

## 2017-12-12 NOTE — Progress Notes (Signed)
[facility] Med Check  Patient ID: Meredith [name],  MRN: [id number]  PCP: Kathyrn Lass, MD  Date of Evaluation: 12/12/2017 Time spent:30 minutes   HISTORY/CURRENT STATUS: HPI CC:  Stressed out by recent fall and concussion. Scary.  Anxious.  Feels she has recovered a good bit except still HA.  Memory is better.  Can be repetitive in conversation still. Pt reports that mood is Anxious and Irritable and describes anxiety as Moderate. Anxiety symptoms include: Excessive Worry,. Pt reports no sleep issues. Pt reports that appetite is good. Pt reports that energy is lethargic and poor motivation and withdrawn from usual activities since fall. Concentration is difficulty with focus and attention. Suicidal thoughts:  denied by patient.  Very long psychiatric history with a history of multiple medications including lithium which was not helpful, risperidone, Geodon which made her more talkative, venlafaxine, Depakote which caused some side effects, lithium, carbamazepine, and risperidone.  Individual Medical History/ Review of Systems: Changes? :Yes Fall with concussion 3 rd week September, Hospitalized 3 days end September.  Had concussion and "hematoma".  Also recent UTI.  Still dizzy and easily fatigued.  Allergies: Codeine; Celebrex [celecoxib]; Sulfa antibiotics; Tricyclic antidepressants; Amoxicillin; Ampicillin; and Penicillins  Current Medications:  Current Outpatient Medications:  .  apixaban (ELIQUIS) 5 MG TABS tablet, Take 1 tablet (5 mg total) by mouth 2 (two) times daily., Disp: 60 tablet, Rfl:  .  calcium carbonate (OS-CAL - DOSED IN MG OF ELEMENTAL CALCIUM) 1250 (500 Ca) MG tablet, Take 1 tablet (500 mg of elemental calcium total) by mouth daily. For bone health, Disp: , Rfl:  .  Cholecalciferol (VITAMIN D3) 1000 units CAPS, Take 1,000 Units by mouth daily. , Disp: , Rfl:  .  diphenoxylate-atropine (LOMOTIL) 2.5-0.025 MG tablet, Take 1 tablet by mouth 4 (four) times daily as  needed for diarrhea or loose stools., Disp: , Rfl:  .  Erenumab-aooe (AIMOVIG) 70 MG/ML SOAJ, Inject 70 mg into the skin every 30 (thirty) days., Disp: , Rfl:  .  estradiol (ESTRACE) 1 MG tablet, Take 1.5 tablets (1.5 mg total) by mouth daily. For hormone replacement, Disp: , Rfl:  .  ferrous sulfate 325 (65 FE) MG tablet, Take 1 tablet (325 mg total) by mouth daily with breakfast. Ferrous sulfate, Disp: , Rfl: 3 .  HYDROcodone-acetaminophen (NORCO) 7.5-325 MG tablet, Take 1 tablet by mouth 2 (two) times daily as needed., Disp: 2 tablet, Rfl: 0 .  hydrocortisone (ANUSOL-HC) 25 MG suppository, Place 1 suppository (25 mg total) rectally 2 (two) times daily., Disp: 12 suppository, Rfl: 0 .  lamoTRIgine (LAMICTAL) 100 MG tablet, Take 1 tablet (100 mg total) by mouth every morning., Disp: 2 tablet, Rfl: 0 .  levothyroxine (SYNTHROID, LEVOTHROID) 112 MCG tablet, Take 1 tablet (112 mcg total) by mouth daily before breakfast. For low thyroid hormone, Disp: , Rfl:  .  Melatonin 5 MG TABS, Take 30 mg by mouth at bedtime. , Disp: , Rfl:  .  metoprolol succinate (TOPROL-XL) 50 MG 24 hr tablet, Take 1 tablet (50 mg total) by mouth daily. Take with or immediately following a meal. Please keep upcoming appt in November.Thank you, Disp: 30 tablet, Rfl: 1 .  ondansetron (ZOFRAN) 4 MG tablet, Take 1 tablet (4 mg total) by mouth 3 (three) times daily as needed. As needed for nausea, Disp: 1 tablet, Rfl: 0 .  pravastatin (PRAVACHOL) 20 MG tablet, Take 20 mg by mouth daily., Disp: , Rfl: 3 .  pregabalin (LYRICA) 150 MG capsule, Take 1 capsule (  150 mg total) by mouth 3 (three) times daily. For nerve pain, Disp: 2 capsule, Rfl: 0 .  propranolol (INDERAL) 10 MG tablet, Take 10 mg by mouth 2 (two) times daily as needed., Disp: , Rfl:  .  verapamil (VERELAN PM) 120 MG 24 hr capsule, Take 1 capsule (120 mg total) by mouth at bedtime. For high blood pressure, Disp: , Rfl:  .  aspirin 325 MG tablet, Take 325 mg by mouth daily. ,  Disp: , Rfl:  .  esomeprazole (NEXIUM) 40 MG capsule, Take 1 capsule (40 mg total) by mouth daily as needed. For acid reflux (Patient not taking: Reported on 12/12/2017), Disp: , Rfl:  .  QUEtiapine (SEROQUEL XR) 300 MG 24 hr tablet, Take 2 tablets (600 mg total) by mouth at bedtime. (Patient taking differently: Take 450 mg by mouth at bedtime. ), Disp: 2 tablet, Rfl: 0 .  senna-docusate (PERI-COLACE) 8.6-50 MG tablet, Take 2 tablets by mouth 2 (two) times daily. For constipation, Disp: , Rfl:  Medication Side Effects: Sedation at higher dosage of Seroquel.  Family Medical/ Social History: Changes? Yes Has had PT and nursing coming to her house.  MENTAL HEALTH EXAM:  There were no vitals taken for this visit.There is no height or weight on file to calculate BMI.  General Appearance: Casual  Eye Contact:  Good  Speech:  talkative  Volume:  Normal  Mood:  Anxious and Irritable  Affect:  Appropriate  Thought Process:  Descriptions of Associations: Circumstantial  Orientation:  Full (Time, Place, and Person)  Thought Content: WDL   Suicidal Thoughts:  No  Homicidal Thoughts:  No  Memory:  Remote  Judgement:  Fair  Insight:  Fair  Psychomotor Activity:  Normal  Concentration:  Concentration: Fair  Recall:  [place] of Knowledge: Good  Language: Good  Akathisia:  No  AIMS (if indicated): not done  Assets:  Housing Leisure Time Social Support Transportation Vocational/Educational  ADL's:  Intact  Cognition: WNL  Prognosis:  Fair    DIAGNOSES:    ICD-10-CM   1. Bipolar I disorder ([place]) F31.9   2. Generalized anxiety disorder F41.1   3. PTSD (post-traumatic stress disorder) F43.10   4. Concussion without loss of consciousness, subsequent encounter S06.0X0D     RECOMMENDATIONS:  Greater than 50% of face to face time with patient was spent on counseling and coordination of care. We discussed the interaction between head injury, UTI, and psych meds.  We will expect her  cognitive problems to gradually improve.  Discussed the risk of post concussive depression and to the let us know if that occurs. Polypharmacy at risk of worsening cognitive difficulties. Cannot tolerate 600mg  Seroquel XR dt sedation.  Now is not a good time to change meds if not essential. Call prn. Supportive therapy for health stressors FU 3 mos.   Purnell Shoemaker, MD

## 2017-12-16 DIAGNOSIS — K581 Irritable bowel syndrome with constipation: Secondary | ICD-10-CM | POA: Diagnosis not present

## 2017-12-16 DIAGNOSIS — Z6837 Body mass index (BMI) 37.0-37.9, adult: Secondary | ICD-10-CM | POA: Diagnosis not present

## 2017-12-16 DIAGNOSIS — D649 Anemia, unspecified: Secondary | ICD-10-CM | POA: Diagnosis not present

## 2017-12-16 DIAGNOSIS — Z0000 Encounter for general adult medical examination without abnormal findings: Secondary | ICD-10-CM | POA: Diagnosis not present

## 2017-12-17 DIAGNOSIS — G9341 Metabolic encephalopathy: Secondary | ICD-10-CM | POA: Diagnosis not present

## 2017-12-17 DIAGNOSIS — F319 Bipolar disorder, unspecified: Secondary | ICD-10-CM | POA: Diagnosis not present

## 2017-12-18 DIAGNOSIS — M791 Myalgia, unspecified site: Secondary | ICD-10-CM | POA: Diagnosis not present

## 2017-12-18 DIAGNOSIS — R51 Headache: Secondary | ICD-10-CM | POA: Diagnosis not present

## 2017-12-18 DIAGNOSIS — M47816 Spondylosis without myelopathy or radiculopathy, lumbar region: Secondary | ICD-10-CM | POA: Diagnosis not present

## 2017-12-18 DIAGNOSIS — G43719 Chronic migraine without aura, intractable, without status migrainosus: Secondary | ICD-10-CM | POA: Diagnosis not present

## 2017-12-18 DIAGNOSIS — M542 Cervicalgia: Secondary | ICD-10-CM | POA: Diagnosis not present

## 2017-12-18 DIAGNOSIS — G518 Other disorders of facial nerve: Secondary | ICD-10-CM | POA: Diagnosis not present

## 2017-12-18 DIAGNOSIS — G894 Chronic pain syndrome: Secondary | ICD-10-CM | POA: Diagnosis not present

## 2017-12-23 DIAGNOSIS — F319 Bipolar disorder, unspecified: Secondary | ICD-10-CM | POA: Diagnosis not present

## 2017-12-23 DIAGNOSIS — G9341 Metabolic encephalopathy: Secondary | ICD-10-CM | POA: Diagnosis not present

## 2017-12-25 ENCOUNTER — Ambulatory Visit (INDEPENDENT_AMBULATORY_CARE_PROVIDER_SITE_OTHER): Payer: Medicare Other | Admitting: Psychology

## 2017-12-25 DIAGNOSIS — F3181 Bipolar II disorder: Secondary | ICD-10-CM

## 2017-12-30 ENCOUNTER — Encounter (INDEPENDENT_AMBULATORY_CARE_PROVIDER_SITE_OTHER): Payer: Self-pay

## 2017-12-30 ENCOUNTER — Ambulatory Visit (INDEPENDENT_AMBULATORY_CARE_PROVIDER_SITE_OTHER): Payer: Medicare Other | Admitting: Cardiovascular Disease

## 2017-12-30 ENCOUNTER — Encounter: Payer: Self-pay | Admitting: Cardiovascular Disease

## 2017-12-30 VITALS — BP 116/64 | HR 83 | Ht 60.0 in | Wt 186.1 lb

## 2017-12-30 DIAGNOSIS — E876 Hypokalemia: Secondary | ICD-10-CM | POA: Diagnosis not present

## 2017-12-30 DIAGNOSIS — I480 Paroxysmal atrial fibrillation: Secondary | ICD-10-CM | POA: Diagnosis not present

## 2017-12-30 MED ORDER — WARFARIN SODIUM 5 MG PO TABS: ORAL_TABLET | ORAL | 0 refills | Status: DC

## 2017-12-30 MED ORDER — WARFARIN SODIUM 2.5 MG PO TABS: ORAL_TABLET | ORAL | 0 refills | Status: DC

## 2017-12-30 NOTE — Patient Instructions (Addendum)
Medication Instructions:  Your physician has recommended you make the following change in your medication:   START Coumadin 5 mg on Mondays, Wednesdays and Fridays TAKE Coumadin 2.5 mg on Tuesdays, Thursdays, Saturdays, and Sundays STOP Aspirin  If you need a refill on your cardiac medications before your next appointment, please call your pharmacy.    Lab work: TODAY - basic metabolic panel  If you have labs (blood work) drawn today and your tests are completely normal, you will receive your results only by: [name] MyChart Message (if you have MyChart) OR . A paper copy in the mail If you have any lab test that is abnormal or we need to change your treatment, we will call you to review the results.   Testing/Procedures: None Ordered   Follow-Up: Your physician recommends that you schedule a follow-up appointment on Friday Nov. 8 with CVRR    At [facility], you and your health needs are our priority.  As part of our continuing mission to provide you with exceptional heart care, we have created designated Provider Care Teams.  These Care Teams include your primary Cardiologist (physician) and Advanced Practice Providers (APPs -  Physician Assistants and Nurse Practitioners) who all work together to provide you with the care you need, when you need it. You will need a follow up appointment in:  1 year.  Please call our office 2 months in advance to schedule this appointment.  You may see Mertie Moores, MD or one of the following Advanced Practice Providers on your designated Care Team: Richardson Dopp, PA-C [place], [place] . Daune Perch, NP

## 2017-12-30 NOTE — Progress Notes (Signed)
Cardiology Office Note   Date:  12/30/2017   ID:  Meredith, [name] 1950/12/06, MRN [id number]  PCP:  Kathyrn Lass, MD  Cardiologist:   Mertie Moores, MD   Chief Complaint  Patient presents with  . Atrial Fibrillation   Problem list 1. Paroxysmal atrial fibrillation 2. Hyperlipidemia 3.  Hypothyroidism 4.  Migraine headaches     Meredith [name] is a 67 y.o. female who presents for follow up of her paroxysmal atrial fib.  Previously from  [place], [place]  Has had 2 A. fib ablations and has had a more recent cryoablation. She was on [place] until a year ago .  Has not had any recurrent atrial fibrillation since that time.  She is on verapamil for history of migraine headaches.  She cannot tell when she is in atrial fib.   Usually has a very fast HR  Gets some exercise on rare occasion.     Oct. 4, 2018:    Doing well from a cardiac standpoint. Was in [place] for 5 days Has lots of anxiety   Nov. 4, 2019:  Meredith [name] is seen back today for follow-up of her history of atrial fibrillation.  She is had 3 separate ablations for atrial fibrillation.   She was recently hospitalized for altered mental status.   She had fallen and hit her head on the cement.      She agreed to take apixaban 5 mg twice a day following that hospitalization.   She took it for 30 days and then stopped   Past Medical History:  Diagnosis Date  . Anxiety   . Atrial fibrillation ([place])   . Bipolar 2 disorder ([place])   . Depression   . History of cardioversion    x2  . Hyperlipidemia   . Hypothyroidism   . Migraine headache   . Osteoporosis     Past Surgical History:  Procedure Laterality Date  . ABLATION    . BACK SURGERY  2014  . CARDIOVERSION  12/2013, 01/2014   x2  . KNEE SURGERY     ORTHOSCOPIC  . LUNG REMOVAL, PARTIAL Right    BENIGN LUNG CYST  . THORACIC SPINE SURGERY Right   . THUMB ARTHROSCOPY       Current Outpatient Medications  Medication  Sig Dispense Refill  . aspirin 325 MG tablet Take 325 mg by mouth daily.     . calcium carbonate (OS-CAL - DOSED IN MG OF ELEMENTAL CALCIUM) 1250 (500 Ca) MG tablet Take 1 tablet (500 mg of elemental calcium total) by mouth daily. For bone health    . Cholecalciferol (VITAMIN D3) 1000 units CAPS Take 1,000 Units by mouth daily.     . Cyanocobalamin (B-12 PO) Take 1 capsule by mouth daily.    Eduard Roux (AIMOVIG) 70 MG/ML SOAJ Inject 70 mg into the skin every 30 (thirty) days.    [name] estradiol (ESTRACE) 1 MG tablet Take 1.5 tablets (1.5 mg total) by mouth daily. For hormone replacement    . ferrous sulfate 325 (65 FE) MG tablet Take 1 tablet (325 mg total) by mouth daily with breakfast. Ferrous sulfate  3  . HYDROcodone-acetaminophen (NORCO) 7.5-325 MG tablet Take 1 tablet by mouth 2 (two) times daily as needed. 2 tablet 0  . lamoTRIgine (LAMICTAL) 100 MG tablet Take 1 tablet (100 mg total) by mouth every morning. 2 tablet 0  . levothyroxine (SYNTHROID, LEVOTHROID) 100 MCG tablet Take 100 mcg by mouth daily before  breakfast.    . Melatonin 5 MG TABS Take 30 mg by mouth at bedtime.     . metoprolol succinate (TOPROL-XL) 50 MG 24 hr tablet Take 1 tablet (50 mg total) by mouth daily. Take with or immediately following a meal. Please keep upcoming appt in November.Thank you 30 tablet 1  . ondansetron (ZOFRAN) 4 MG tablet Take 1 tablet (4 mg total) by mouth 3 (three) times daily as needed. As needed for nausea 1 tablet 0  . pantoprazole (PROTONIX) 20 MG tablet Take 20 mg by mouth daily.    . pravastatin (PRAVACHOL) 20 MG tablet Take 20 mg by mouth daily.  3  . pregabalin (LYRICA) 150 MG capsule Take 1 capsule (150 mg total) by mouth 3 (three) times daily. For nerve pain 2 capsule 0  . propranolol (INDERAL) 10 MG tablet Take 10 mg by mouth 2 (two) times daily as needed.    [name] QUEtiapine (SEROQUEL XR) 300 MG 24 hr tablet Take 2 tablets (600 mg total) by mouth at bedtime. (Patient taking differently: Take  450 mg by mouth at bedtime. ) 2 tablet 0  . QUEtiapine (SEROQUEL XR) 300 MG 24 hr tablet Take 450 mg by mouth at bedtime.    . senna-docusate (PERI-COLACE) 8.6-50 MG tablet Take 2 tablets by mouth 2 (two) times daily. For constipation    . verapamil (VERELAN PM) 120 MG 24 hr capsule Take 1 capsule (120 mg total) by mouth at bedtime. For high blood pressure     No current facility-administered medications for this visit.     Allergies:   Codeine; Celebrex [celecoxib]; Cephalexin; Sulfa antibiotics; Tricyclic antidepressants; Amoxicillin; Ampicillin; and Penicillins    Social History:  The patient  reports that she has never smoked. She has never used smokeless tobacco. She reports that she does not drink alcohol or use drugs.   Family History:  The patient's family history includes Atrial fibrillation in her brother.    ROS:  Please see the history of present illness.      Physical Exam: Blood pressure 116/64, pulse 83, height 5' (1.524 m), weight 186 lb 1.9 oz (84.4 kg), SpO2 99 %.  GEN:  Well nourished, well developed in no acute distress HEENT: Normal NECK: No JVD; No carotid bruits LYMPHATICS: No lymphadenopathy CARDIAC: RRR  , no murmurs, rubs, gallops RESPIRATORY:  Clear to auscultation without rales, wheezing or rhonchi  ABDOMEN: Soft, non-tender, non-distended MUSCULOSKELETAL:  No edema; No deformity  SKIN: Warm and dry NEUROLOGIC:  Alert and oriented x 3     EKG:       Recent Labs: 11/24/2017: TSH 4.349 11/25/2017: ALT 18 11/26/2017: BUN 10; Creatinine, Ser 0.73; Hemoglobin 11.3; Magnesium 2.1; Platelets 253; Potassium 3.1; Sodium 138    Lipid Panel    Component Value Date/Time   CHOL 224 (H) 07/27/2016 0603   TRIG 92 07/27/2016 0603   HDL 118 07/27/2016 0603   CHOLHDL 1.9 07/27/2016 0603   VLDL 18 07/27/2016 0603   LDLCALC 88 07/27/2016 0603      Wt Readings from Last 3 Encounters:  12/30/17 186 lb 1.9 oz (84.4 kg)  11/25/17 191 lb 9.3 oz (86.9 kg)    11/29/16 168 lb (76.2 kg)      Other studies Reviewed: Additional studies/ records that were reviewed today include: . Review of the above records demonstrates:    ASSESSMENT AND PLAN:  1.  Paroxysmal atrial fib:   Has had 3 a-fib ablations  ( 2 regular ablations and  1 cryo- ablation)  CHADS2VASC = 2  ( female, 53)   She took Eliquis for 1 month and then was not able to afford the medication following that month trial.  We will start her on Coumadin.  We will start 5 mg on Mondays, Wednesdays, Fridays.  2.5 mg on other days. Have her see the Coumadin clinic on Friday.  We will stop aspirin.    Current medicines are reviewed at length with the patient today.  The patient does not have concerns regarding medicines.  Labs/ tests ordered today include:  No orders of the defined types were placed in this encounter.    Disposition:   FU with  Me in 1 year.     Mertie Moores, MD  12/30/2017 4:35 PM    [address], [place], [place]  [address] Phone: [phone]; Fax: [phone]   [facility]  [address] [place],   [address] [phone]   Fax [phone]

## 2017-12-31 LAB — BASIC METABOLIC PANEL WITH GFR
BUN/Creatinine Ratio: 8 — ABNORMAL LOW (ref 12–28)
BUN: 6 mg/dL — ABNORMAL LOW (ref 8–27)
CO2: 25 mmol/L (ref 20–29)
Calcium: 9.7 mg/dL (ref 8.7–10.3)
Chloride: 105 mmol/L (ref 96–106)
Creatinine, Ser: 0.74 mg/dL (ref 0.57–1.00)
GFR calc Af Amer: 97 mL/min/1.73
GFR calc non Af Amer: 84 mL/min/1.73
Glucose: 94 mg/dL (ref 65–99)
Potassium: 4.2 mmol/L (ref 3.5–5.2)
Sodium: 145 mmol/L — ABNORMAL HIGH (ref 134–144)

## 2018-01-02 DIAGNOSIS — M169 Osteoarthritis of hip, unspecified: Secondary | ICD-10-CM | POA: Diagnosis not present

## 2018-01-03 ENCOUNTER — Ambulatory Visit (INDEPENDENT_AMBULATORY_CARE_PROVIDER_SITE_OTHER): Payer: Medicare Other | Admitting: *Deleted

## 2018-01-03 DIAGNOSIS — Z5181 Encounter for therapeutic drug level monitoring: Secondary | ICD-10-CM

## 2018-01-03 DIAGNOSIS — I480 Paroxysmal atrial fibrillation: Secondary | ICD-10-CM | POA: Diagnosis not present

## 2018-01-03 LAB — POCT INR: INR: 1.6 — AB (ref 2.0–3.0)

## 2018-01-03 NOTE — Patient Instructions (Addendum)
A full discussion of the nature of anticoagulants has been carried out.  A benefit risk analysis has been presented to the patient, so that they understand the justification for choosing anticoagulation at this time. The need for frequent and regular monitoring, precise dosage adjustment and compliance is stressed.  Side effects of potential bleeding are discussed.  The patient should avoid any OTC items containing aspirin or ibuprofen, and should avoid great swings in general diet.  Avoid alcohol consumption.  Call if any signs of abnormal bleeding.  Description   Start taking 2.5mg  daily. Recheck INR in 1 week. Call with any medication changes or procedures: Coumadin Clinic [phone] Main [phone]

## 2018-01-08 ENCOUNTER — Ambulatory Visit (INDEPENDENT_AMBULATORY_CARE_PROVIDER_SITE_OTHER): Payer: Medicare Other | Admitting: Psychology

## 2018-01-08 DIAGNOSIS — F3181 Bipolar II disorder: Secondary | ICD-10-CM | POA: Diagnosis not present

## 2018-01-09 ENCOUNTER — Ambulatory Visit (INDEPENDENT_AMBULATORY_CARE_PROVIDER_SITE_OTHER): Payer: Medicare Other | Admitting: Psychiatry

## 2018-01-09 ENCOUNTER — Encounter: Payer: Self-pay | Admitting: Psychiatry

## 2018-01-09 DIAGNOSIS — F4310 Post-traumatic stress disorder, unspecified: Secondary | ICD-10-CM

## 2018-01-09 DIAGNOSIS — F319 Bipolar disorder, unspecified: Secondary | ICD-10-CM

## 2018-01-09 DIAGNOSIS — F411 Generalized anxiety disorder: Secondary | ICD-10-CM

## 2018-01-09 DIAGNOSIS — F4001 Agoraphobia with panic disorder: Secondary | ICD-10-CM

## 2018-01-09 MED ORDER — QUETIAPINE FUMARATE ER 300 MG PO TB24: 600.0000 mg | ORAL_TABLET | Freq: Every day | ORAL | 3 refills | Status: DC

## 2018-01-09 MED ORDER — SERTRALINE HCL 50 MG PO TABS: ORAL_TABLET | ORAL | 1 refills | Status: DC
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
## 2018-01-10 ENCOUNTER — Ambulatory Visit (INDEPENDENT_AMBULATORY_CARE_PROVIDER_SITE_OTHER): Payer: Medicare Other | Admitting: *Deleted

## 2018-01-10 DIAGNOSIS — M542 Cervicalgia: Secondary | ICD-10-CM | POA: Diagnosis not present

## 2018-01-10 DIAGNOSIS — Z5181 Encounter for therapeutic drug level monitoring: Secondary | ICD-10-CM | POA: Diagnosis not present

## 2018-01-10 DIAGNOSIS — G43719 Chronic migraine without aura, intractable, without status migrainosus: Secondary | ICD-10-CM | POA: Diagnosis not present

## 2018-01-10 DIAGNOSIS — M791 Myalgia, unspecified site: Secondary | ICD-10-CM | POA: Diagnosis not present

## 2018-01-10 DIAGNOSIS — G518 Other disorders of facial nerve: Secondary | ICD-10-CM | POA: Diagnosis not present

## 2018-01-10 DIAGNOSIS — I480 Paroxysmal atrial fibrillation: Secondary | ICD-10-CM | POA: Diagnosis not present

## 2018-01-10 DIAGNOSIS — R51 Headache: Secondary | ICD-10-CM | POA: Diagnosis not present

## 2018-01-10 LAB — POCT INR: INR: 2.3 (ref 2.0–3.0)

## 2018-01-10 NOTE — Patient Instructions (Signed)
Description   Continue taking 2.5mg  daily. Recheck INR in 1 week. Call with any medication changes or procedures: [facility] [phone] Main [phone]

## 2018-01-15 DIAGNOSIS — Z79899 Other long term (current) drug therapy: Secondary | ICD-10-CM | POA: Diagnosis not present

## 2018-01-15 DIAGNOSIS — Z79891 Long term (current) use of opiate analgesic: Secondary | ICD-10-CM | POA: Diagnosis not present

## 2018-01-15 DIAGNOSIS — M47816 Spondylosis without myelopathy or radiculopathy, lumbar region: Secondary | ICD-10-CM | POA: Diagnosis not present

## 2018-01-15 DIAGNOSIS — G894 Chronic pain syndrome: Secondary | ICD-10-CM | POA: Diagnosis not present

## 2018-01-17 ENCOUNTER — Ambulatory Visit (INDEPENDENT_AMBULATORY_CARE_PROVIDER_SITE_OTHER): Payer: Medicare Other | Admitting: *Deleted

## 2018-01-17 DIAGNOSIS — Z5181 Encounter for therapeutic drug level monitoring: Secondary | ICD-10-CM | POA: Diagnosis not present

## 2018-01-17 DIAGNOSIS — I480 Paroxysmal atrial fibrillation: Secondary | ICD-10-CM | POA: Diagnosis not present

## 2018-01-17 LAB — POCT INR: INR: 1.8 — AB (ref 2.0–3.0)

## 2018-01-17 NOTE — Patient Instructions (Signed)
Description   Today take an extra 2.5mg , then start taking 2.5mg  daily except 5mg s on Fridays.  Recheck INR in on Wednesday.  Call with any medication changes or procedures: [facility] [phone] Main [phone]

## 2018-01-22 ENCOUNTER — Ambulatory Visit (INDEPENDENT_AMBULATORY_CARE_PROVIDER_SITE_OTHER): Payer: Medicare Other | Admitting: *Deleted

## 2018-01-22 ENCOUNTER — Other Ambulatory Visit: Payer: Self-pay | Admitting: Cardiovascular Disease

## 2018-01-22 ENCOUNTER — Ambulatory Visit (INDEPENDENT_AMBULATORY_CARE_PROVIDER_SITE_OTHER): Payer: Medicare Other | Admitting: Psychology

## 2018-01-22 DIAGNOSIS — F3181 Bipolar II disorder: Secondary | ICD-10-CM

## 2018-01-22 DIAGNOSIS — I480 Paroxysmal atrial fibrillation: Secondary | ICD-10-CM

## 2018-01-22 DIAGNOSIS — Z5181 Encounter for therapeutic drug level monitoring: Secondary | ICD-10-CM

## 2018-01-22 LAB — POCT INR: INR: 1.7 — AB (ref 2.0–3.0)

## 2018-01-22 MED ORDER — WARFARIN SODIUM 5 MG PO TABS: ORAL_TABLET | ORAL | 1 refills | Status: DC

## 2018-01-22 MED ORDER — WARFARIN SODIUM 2.5 MG PO TABS: ORAL_TABLET | ORAL | 1 refills | Status: DC

## 2018-01-22 NOTE — Patient Instructions (Signed)
Description   Today take an extra 2.5mg , then start taking 2.5mg  daily except 5mg s on Monday and Fridays.  Recheck INR in 1 week.  Call with any medication changes or procedures: [facility] [phone] Main [phone]

## 2018-01-27 DIAGNOSIS — G43719 Chronic migraine without aura, intractable, without status migrainosus: Secondary | ICD-10-CM | POA: Diagnosis not present

## 2018-01-27 DIAGNOSIS — M791 Myalgia, unspecified site: Secondary | ICD-10-CM | POA: Diagnosis not present

## 2018-01-27 DIAGNOSIS — M542 Cervicalgia: Secondary | ICD-10-CM | POA: Diagnosis not present

## 2018-01-27 DIAGNOSIS — G518 Other disorders of facial nerve: Secondary | ICD-10-CM | POA: Diagnosis not present

## 2018-01-27 DIAGNOSIS — R51 Headache: Secondary | ICD-10-CM | POA: Diagnosis not present

## 2018-01-27 MED ORDER — METOPROLOL SUCCINATE ER 50 MG PO TB24: ORAL_TABLET | ORAL | 11 refills | Status: DC

## 2018-01-27 NOTE — Addendum Note (Signed)
Addended by: Derl Barrow on: 01/27/2018 09:28 AM   Modules accepted: Orders

## 2018-01-30 ENCOUNTER — Ambulatory Visit (INDEPENDENT_AMBULATORY_CARE_PROVIDER_SITE_OTHER): Payer: Medicare Other

## 2018-01-30 ENCOUNTER — Other Ambulatory Visit: Payer: Self-pay | Admitting: Psychiatry

## 2018-01-30 DIAGNOSIS — Z5181 Encounter for therapeutic drug level monitoring: Secondary | ICD-10-CM | POA: Diagnosis not present

## 2018-01-30 DIAGNOSIS — I480 Paroxysmal atrial fibrillation: Secondary | ICD-10-CM | POA: Diagnosis not present

## 2018-01-30 LAB — POCT INR: INR: 2.6 (ref 2.0–3.0)

## 2018-01-30 NOTE — Patient Instructions (Signed)
Description   Continue on same dosage 2.5mg  daily except 5mg s on Monday and Fridays.  Recheck INR in 2 week.  Call with any medication changes or procedures: [facility] [phone] Main [phone]

## 2018-02-05 ENCOUNTER — Ambulatory Visit (INDEPENDENT_AMBULATORY_CARE_PROVIDER_SITE_OTHER): Payer: Medicare Other | Admitting: Psychology

## 2018-02-05 DIAGNOSIS — F3181 Bipolar II disorder: Secondary | ICD-10-CM

## 2018-02-07 ENCOUNTER — Encounter: Payer: Self-pay | Admitting: Psychiatry

## 2018-02-07 ENCOUNTER — Ambulatory Visit (INDEPENDENT_AMBULATORY_CARE_PROVIDER_SITE_OTHER): Payer: Medicare Other | Admitting: Psychiatry

## 2018-02-07 DIAGNOSIS — F4001 Agoraphobia with panic disorder: Secondary | ICD-10-CM | POA: Diagnosis not present

## 2018-02-07 DIAGNOSIS — F319 Bipolar disorder, unspecified: Secondary | ICD-10-CM

## 2018-02-07 DIAGNOSIS — F5105 Insomnia due to other mental disorder: Secondary | ICD-10-CM

## 2018-02-07 DIAGNOSIS — F411 Generalized anxiety disorder: Secondary | ICD-10-CM

## 2018-02-07 DIAGNOSIS — S060X0D Concussion without loss of consciousness, subsequent encounter: Secondary | ICD-10-CM

## 2018-02-07 DIAGNOSIS — F4310 Post-traumatic stress disorder, unspecified: Secondary | ICD-10-CM

## 2018-02-07 MED ORDER — BUSPIRONE HCL 15 MG PO TABS: ORAL_TABLET | ORAL | 1 refills | Status: DC
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
## 2018-02-12 DIAGNOSIS — G894 Chronic pain syndrome: Secondary | ICD-10-CM | POA: Diagnosis not present

## 2018-02-12 DIAGNOSIS — M47816 Spondylosis without myelopathy or radiculopathy, lumbar region: Secondary | ICD-10-CM | POA: Diagnosis not present

## 2018-02-13 ENCOUNTER — Ambulatory Visit (INDEPENDENT_AMBULATORY_CARE_PROVIDER_SITE_OTHER): Payer: Medicare Other | Admitting: Pharmacist

## 2018-02-13 DIAGNOSIS — G518 Other disorders of facial nerve: Secondary | ICD-10-CM | POA: Diagnosis not present

## 2018-02-13 DIAGNOSIS — M542 Cervicalgia: Secondary | ICD-10-CM | POA: Diagnosis not present

## 2018-02-13 DIAGNOSIS — I480 Paroxysmal atrial fibrillation: Secondary | ICD-10-CM

## 2018-02-13 DIAGNOSIS — R51 Headache: Secondary | ICD-10-CM | POA: Diagnosis not present

## 2018-02-13 DIAGNOSIS — Z5181 Encounter for therapeutic drug level monitoring: Secondary | ICD-10-CM | POA: Diagnosis not present

## 2018-02-13 DIAGNOSIS — G43719 Chronic migraine without aura, intractable, without status migrainosus: Secondary | ICD-10-CM | POA: Diagnosis not present

## 2018-02-13 DIAGNOSIS — M791 Myalgia, unspecified site: Secondary | ICD-10-CM | POA: Diagnosis not present

## 2018-02-13 LAB — POCT INR: INR: 2.5 (ref 2.0–3.0)

## 2018-02-13 NOTE — Patient Instructions (Signed)
Description   Continue on same dosage 2.5mg  daily except 5mg s on Monday and Fridays.  Recheck INR in 3 week.  Call with any medication changes or procedures: [facility] [phone] Main [phone]

## 2018-02-15 ENCOUNTER — Other Ambulatory Visit: Payer: Self-pay | Admitting: Cardiovascular Disease

## 2018-02-17 ENCOUNTER — Ambulatory Visit (INDEPENDENT_AMBULATORY_CARE_PROVIDER_SITE_OTHER): Payer: Medicare Other | Admitting: Psychology

## 2018-02-17 DIAGNOSIS — F3181 Bipolar II disorder: Secondary | ICD-10-CM

## 2018-02-24 ENCOUNTER — Ambulatory Visit (INDEPENDENT_AMBULATORY_CARE_PROVIDER_SITE_OTHER): Payer: Medicare Other | Admitting: Family Medicine

## 2018-02-24 ENCOUNTER — Encounter: Payer: Self-pay | Admitting: Family Medicine

## 2018-02-24 VITALS — BP 122/70 | HR 86 | Temp 98.7°F | Resp 12 | Ht 60.0 in | Wt 191.4 lb

## 2018-02-24 DIAGNOSIS — E039 Hypothyroidism, unspecified: Secondary | ICD-10-CM | POA: Diagnosis not present

## 2018-02-24 DIAGNOSIS — G894 Chronic pain syndrome: Secondary | ICD-10-CM | POA: Diagnosis not present

## 2018-02-24 DIAGNOSIS — F3164 Bipolar disorder, current episode mixed, severe, with psychotic features: Secondary | ICD-10-CM | POA: Diagnosis not present

## 2018-02-24 DIAGNOSIS — E538 Deficiency of other specified B group vitamins: Secondary | ICD-10-CM | POA: Diagnosis not present

## 2018-02-24 DIAGNOSIS — N951 Menopausal and female climacteric states: Secondary | ICD-10-CM

## 2018-02-24 DIAGNOSIS — D509 Iron deficiency anemia, unspecified: Secondary | ICD-10-CM

## 2018-02-24 DIAGNOSIS — I480 Paroxysmal atrial fibrillation: Secondary | ICD-10-CM

## 2018-02-24 LAB — CBC
HCT: 38.5 % (ref 36.0–46.0)
Hemoglobin: 13.1 g/dL (ref 12.0–15.0)
MCHC: 34 g/dL (ref 30.0–36.0)
MCV: 91 fl (ref 78.0–100.0)
Platelets: 250 10*3/uL (ref 150.0–400.0)
RBC: 4.23 Mil/uL (ref 3.87–5.11)
RDW: 13.9 % (ref 11.5–15.5)
WBC: 6.2 10*3/uL (ref 4.0–10.5)

## 2018-02-24 LAB — FERRITIN: Ferritin: 86.5 ng/mL (ref 10.0–291.0)

## 2018-02-24 LAB — VITAMIN B12: Vitamin B-12: >1500 pg/mL — ABNORMAL HIGH (ref 211–911)

## 2018-02-24 MED ORDER — ESTRADIOL 1 MG PO TABS: 1.5000 mg | ORAL_TABLET | Freq: Every day | ORAL | 2 refills | Status: DC
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
## 2018-02-24 NOTE — Patient Instructions (Addendum)
A few things to remember from today's visit:   Menopausal syndrome (hot flashes) - Plan: estradiol (ESTRACE) 1 MG tablet  Iron deficiency anemia, unspecified iron deficiency anemia type - Plan: CBC, Ferritin  B12 deficiency - Plan: Vitamin B12  No changes on your medications today. As far as lab results are otherwise normal, I can see you annually. Please ask your pain management office to complete forms in order to get Lyrica.  This needs to be done by the physician that he is supervising the PA.  Please be sure medication list is accurate. If a new problem present, please set up appointment sooner than planned today.

## 2018-02-28 ENCOUNTER — Encounter: Payer: Self-pay | Admitting: Family Medicine

## 2018-03-05 ENCOUNTER — Ambulatory Visit: Payer: Medicare Other | Admitting: Psychology

## 2018-03-05 ENCOUNTER — Other Ambulatory Visit: Payer: Self-pay | Admitting: Cardiovascular Disease

## 2018-03-07 ENCOUNTER — Ambulatory Visit (INDEPENDENT_AMBULATORY_CARE_PROVIDER_SITE_OTHER): Payer: Medicare Other | Admitting: *Deleted

## 2018-03-07 DIAGNOSIS — I480 Paroxysmal atrial fibrillation: Secondary | ICD-10-CM | POA: Diagnosis not present

## 2018-03-07 DIAGNOSIS — Z5181 Encounter for therapeutic drug level monitoring: Secondary | ICD-10-CM

## 2018-03-07 LAB — POCT INR: INR: 2.2 (ref 2.0–3.0)

## 2018-03-07 NOTE — Patient Instructions (Signed)
Description   Continue on same dosage 2.5mg  daily except 5mg s on Monday and Fridays.  Recheck INR in 4 week.  Call with any medication changes or procedures: [facility] [phone] Main [phone]

## 2018-03-11 DIAGNOSIS — M791 Myalgia, unspecified site: Secondary | ICD-10-CM | POA: Diagnosis not present

## 2018-03-11 DIAGNOSIS — G43719 Chronic migraine without aura, intractable, without status migrainosus: Secondary | ICD-10-CM | POA: Diagnosis not present

## 2018-03-11 DIAGNOSIS — M542 Cervicalgia: Secondary | ICD-10-CM | POA: Diagnosis not present

## 2018-03-11 DIAGNOSIS — G518 Other disorders of facial nerve: Secondary | ICD-10-CM | POA: Diagnosis not present

## 2018-03-12 DIAGNOSIS — M169 Osteoarthritis of hip, unspecified: Secondary | ICD-10-CM | POA: Diagnosis not present

## 2018-03-12 DIAGNOSIS — M47816 Spondylosis without myelopathy or radiculopathy, lumbar region: Secondary | ICD-10-CM | POA: Diagnosis not present

## 2018-03-12 DIAGNOSIS — G894 Chronic pain syndrome: Secondary | ICD-10-CM | POA: Diagnosis not present

## 2018-03-14 ENCOUNTER — Encounter: Payer: Self-pay | Admitting: Psychiatry

## 2018-03-14 ENCOUNTER — Ambulatory Visit (INDEPENDENT_AMBULATORY_CARE_PROVIDER_SITE_OTHER): Payer: Medicare Other | Admitting: Psychiatry

## 2018-03-14 DIAGNOSIS — F4310 Post-traumatic stress disorder, unspecified: Secondary | ICD-10-CM | POA: Diagnosis not present

## 2018-03-14 DIAGNOSIS — F5105 Insomnia due to other mental disorder: Secondary | ICD-10-CM | POA: Diagnosis not present

## 2018-03-14 DIAGNOSIS — F319 Bipolar disorder, unspecified: Secondary | ICD-10-CM

## 2018-03-14 DIAGNOSIS — F411 Generalized anxiety disorder: Secondary | ICD-10-CM

## 2018-03-14 DIAGNOSIS — F4001 Agoraphobia with panic disorder: Secondary | ICD-10-CM

## 2018-03-14 MED ORDER — LAMOTRIGINE 100 MG PO TABS: 100.0000 mg | ORAL_TABLET | Freq: Two times a day (BID) | ORAL | 0 refills | Status: DC

## 2018-03-14 MED ORDER — BUSPIRONE HCL 15 MG PO TABS: 15.0000 mg | ORAL_TABLET | Freq: Two times a day (BID) | ORAL | 0 refills | Status: DC

## 2018-03-14 NOTE — Patient Instructions (Signed)
Increase lamotrigine to 1 1/2 tablets and decrease Seroquel XR to 1 tablet for 2 weeks,  Then increase lamotrigine to 2 tablets daily and reduce Seroquel to 1/2 tablet at night.
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
## 2018-03-19 ENCOUNTER — Ambulatory Visit (INDEPENDENT_AMBULATORY_CARE_PROVIDER_SITE_OTHER): Payer: Medicare Other | Admitting: Psychology

## 2018-03-19 DIAGNOSIS — F3181 Bipolar II disorder: Secondary | ICD-10-CM

## 2018-03-22 ENCOUNTER — Emergency Department (HOSPITAL_COMMUNITY)
Admission: EM | Admit: 2018-03-22 | Discharge: 2018-03-22 | Disposition: A | Discharge status: Home / Self Care | Payer: Medicare Other | Attending: Emergency Medicine | Admitting: Emergency Medicine

## 2018-03-22 ENCOUNTER — Encounter (HOSPITAL_COMMUNITY): Payer: Self-pay | Admitting: Emergency Medicine

## 2018-03-22 ENCOUNTER — Emergency Department (HOSPITAL_COMMUNITY): Payer: Medicare Other

## 2018-03-22 ENCOUNTER — Other Ambulatory Visit: Payer: Self-pay

## 2018-03-22 DIAGNOSIS — Z79899 Other long term (current) drug therapy: Secondary | ICD-10-CM | POA: Diagnosis not present

## 2018-03-22 DIAGNOSIS — W19XXXA Unspecified fall, initial encounter: Secondary | ICD-10-CM | POA: Diagnosis not present

## 2018-03-22 DIAGNOSIS — Z7982 Long term (current) use of aspirin: Secondary | ICD-10-CM | POA: Insufficient documentation

## 2018-03-22 DIAGNOSIS — S0083XA Contusion of other part of head, initial encounter: Secondary | ICD-10-CM | POA: Diagnosis not present

## 2018-03-22 DIAGNOSIS — W01198A Fall on same level from slipping, tripping and stumbling with subsequent striking against other object, initial encounter: Secondary | ICD-10-CM | POA: Insufficient documentation

## 2018-03-22 DIAGNOSIS — F319 Bipolar disorder, unspecified: Secondary | ICD-10-CM | POA: Insufficient documentation

## 2018-03-22 DIAGNOSIS — Z7901 Long term (current) use of anticoagulants: Secondary | ICD-10-CM | POA: Insufficient documentation

## 2018-03-22 DIAGNOSIS — E039 Hypothyroidism, unspecified: Secondary | ICD-10-CM | POA: Insufficient documentation

## 2018-03-22 DIAGNOSIS — R05 Cough: Secondary | ICD-10-CM | POA: Diagnosis not present

## 2018-03-22 DIAGNOSIS — S0990XA Unspecified injury of head, initial encounter: Secondary | ICD-10-CM | POA: Diagnosis not present

## 2018-03-22 DIAGNOSIS — J069 Acute upper respiratory infection, unspecified: Secondary | ICD-10-CM | POA: Insufficient documentation

## 2018-03-22 DIAGNOSIS — F419 Anxiety disorder, unspecified: Secondary | ICD-10-CM | POA: Diagnosis not present

## 2018-03-22 DIAGNOSIS — I4891 Unspecified atrial fibrillation: Secondary | ICD-10-CM | POA: Diagnosis not present

## 2018-03-22 DIAGNOSIS — Y998 Other external cause status: Secondary | ICD-10-CM | POA: Insufficient documentation

## 2018-03-22 DIAGNOSIS — Y92512 Supermarket, store or market as the place of occurrence of the external cause: Secondary | ICD-10-CM | POA: Diagnosis not present

## 2018-03-22 DIAGNOSIS — R51 Headache: Secondary | ICD-10-CM | POA: Diagnosis not present

## 2018-03-22 DIAGNOSIS — Y9389 Activity, other specified: Secondary | ICD-10-CM | POA: Insufficient documentation

## 2018-03-22 DIAGNOSIS — S0003XA Contusion of scalp, initial encounter: Secondary | ICD-10-CM | POA: Diagnosis not present

## 2018-03-22 LAB — CBC WITH DIFFERENTIAL/PLATELET
Abs Immature Granulocytes: 0.28 10*3/uL — ABNORMAL HIGH (ref 0.00–0.07)
Basophils Absolute: 0.1 10*3/uL (ref 0.0–0.1)
Basophils Relative: 1 %
Eosinophils Absolute: 0.4 10*3/uL (ref 0.0–0.5)
Eosinophils Relative: 4 %
HCT: 40.2 % (ref 36.0–46.0)
Hemoglobin: 12.6 g/dL (ref 12.0–15.0)
Immature Granulocytes: 3 %
Lymphocytes Relative: 19 %
Lymphs Abs: 1.7 10*3/uL (ref 0.7–4.0)
MCH: 29.2 pg (ref 26.0–34.0)
MCHC: 31.3 g/dL (ref 30.0–36.0)
MCV: 93.1 fL (ref 80.0–100.0)
Monocytes Absolute: 1 10*3/uL (ref 0.1–1.0)
Monocytes Relative: 11 %
Neutro Abs: 5.6 10*3/uL (ref 1.7–7.7)
Neutrophils Relative %: 62 %
Platelets: 259 10*3/uL (ref 150–400)
RBC: 4.32 MIL/uL (ref 3.87–5.11)
RDW: 13.2 % (ref 11.5–15.5)
WBC: 9.1 10*3/uL (ref 4.0–10.5)
nRBC: 0 % (ref 0.0–0.2)

## 2018-03-22 LAB — COMPREHENSIVE METABOLIC PANEL
ALT: 13 U/L (ref 0–44)
AST: 19 U/L (ref 15–41)
Albumin: 3.7 g/dL (ref 3.5–5.0)
Alkaline Phosphatase: 54 U/L (ref 38–126)
Anion gap: 10 (ref 5–15)
BUN: 11 mg/dL (ref 8–23)
CO2: 28 mmol/L (ref 22–32)
Calcium: 9.7 mg/dL (ref 8.9–10.3)
Chloride: 99 mmol/L (ref 98–111)
Creatinine, Ser: 0.98 mg/dL (ref 0.44–1.00)
GFR calc Af Amer: >60 mL/min (ref >60)
GFR calc non Af Amer: 60 mL/min — ABNORMAL LOW (ref >60)
Glucose, Bld: 85 mg/dL (ref 70–99)
Potassium: 4.1 mmol/L (ref 3.5–5.1)
Sodium: 137 mmol/L (ref 135–145)
Total Bilirubin: 0.5 mg/dL (ref 0.3–1.2)
Total Protein: 6.8 g/dL (ref 6.5–8.1)

## 2018-03-22 LAB — PROTIME-INR
INR: 2.74
Prothrombin Time: 28.6 seconds — ABNORMAL HIGH (ref 11.4–15.2)

## 2018-03-22 NOTE — ED Provider Notes (Signed)
[name]PART[facility] Provider Note   CSN: [id number] Arrival date & time: 03/22/18  1151     History   Chief Complaint Chief Complaint  Patient presents with  . Fall    HPI Meredith [name] is a 68 y.o. female.  HPI   Was walking into [employer] and rubber toe of shoe hit the linoleum and fell forwards hitting head. Put hands out. No LOC.  No arm pain or leg pain, no numbness or weakness, or neck or back pain. (has chronic back pain on meds but no change)  On coumadin for afib.   Past Medical History:  Diagnosis Date  . Anxiety   . Atrial fibrillation ([place])   . Bipolar 2 disorder ([place])   . Depression   . History of cardioversion    x2  . Hyperlipidemia   . Hypothyroidism   . Migraine headache   . Osteoporosis     Patient Active Problem List   Diagnosis Date Noted  . Hypothyroidism 02/24/2018  . Encounter for therapeutic drug monitoring 01/03/2018  . PTSD (post-traumatic stress disorder) 11/29/2017  . Falls 11/25/2017  . Disorientation   . Delirium 11/24/2017  . Anxiety 11/29/2016  . Chronic pain syndrome 07/30/2016  . Bipolar disorder, curr episode mixed, severe, with psychotic features ([place]) 07/28/2016  . PAF (paroxysmal atrial fibrillation) ([place]) 11/14/2015    Past Surgical History:  Procedure Laterality Date  . ABLATION    . BACK SURGERY  2014  . CARDIOVERSION  12/2013, 01/2014   x2  . KNEE SURGERY     ORTHOSCOPIC  . LUNG REMOVAL, PARTIAL Right    BENIGN LUNG CYST  . THORACIC SPINE SURGERY Right   . THUMB ARTHROSCOPY       OB History   No obstetric history on file.      Home Medications    Prior to Admission medications   Medication Sig Start Date End Date Taking? Authorizing Provider  aspirin 325 MG tablet Take 325 mg by mouth daily.   Yes [provider]  busPIRone (BUSPAR) 15 MG tablet Take 1 tablet (15 mg total) by mouth 2 (two) times daily. 03/14/18  Yes Cottle, Billey Co., MD  calcium carbonate  (OS-CAL - DOSED IN MG OF ELEMENTAL CALCIUM) 1250 (500 Ca) MG tablet Take 1 tablet (500 mg of elemental calcium total) by mouth daily. For bone health 08/01/16  Yes Lindell Spar I, NP  Cholecalciferol (VITAMIN D3) 1000 units CAPS Take 5,000 Units by mouth daily.    Yes [provider]  dextromethorphan-guaiFENesin (MUCINEX DM) 30-600 MG 12hr tablet Take 1 tablet by mouth 2 (two) times daily.   Yes [provider]  diphenhydrAMINE (BENADRYL ALLERGY) 25 mg capsule Take 50 mg by mouth 3 (three) times daily.    Yes [provider]  Erenumab-aooe (AIMOVIG) 70 MG/ML SOAJ Inject 70 mg into the skin every 30 (thirty) days.   Yes [provider]  estradiol (ESTRACE) 1 MG tablet Take 1.5 tablets (1.5 mg total) by mouth daily. For hormone replacement Patient taking differently: Take 1 mg by mouth daily. For hormone replacement 02/24/18  Yes Martinique, Betty G, MD  ferrous sulfate 325 (65 FE) MG tablet Take 1 tablet (325 mg total) by mouth daily with breakfast. Ferrous sulfate 07/31/16  Yes Nwoko, Herbert Pun I, NP  HYDROcodone-acetaminophen (NORCO) 7.5-325 MG tablet Take 1 tablet by mouth 2 (two) times daily as needed. 11/26/17  Yes Nita Sells, MD  lamoTRIgine (LAMICTAL) 100 MG tablet  Take 1 tablet (100 mg total) by mouth 2 (two) times daily. Patient taking differently: Take 150 mg by mouth daily.  03/14/18  Yes Cottle, Billey Co., MD  levothyroxine (SYNTHROID, LEVOTHROID) 100 MCG tablet Take 100 mcg by mouth daily before breakfast.   Yes [provider]  Melatonin 5 MG TABS Take 30 mg by mouth at bedtime.    Yes [provider]  metoprolol succinate (TOPROL-XL) 50 MG 24 hr tablet Take 1 tablet by mouth daily with or immediately following a meal. 01/27/18  Yes [name], Wonda Cheng, MD  pantoprazole (PROTONIX) 40 MG tablet Take 40 mg by mouth daily.   Yes [provider]  pravastatin (PRAVACHOL) 20 MG tablet Take 20 mg by mouth daily. 10/22/17  Yes [provider]  pregabalin (LYRICA) 150 MG capsule Take 1 capsule (150 mg total) by mouth 3 (three) times daily. For nerve pain 11/26/17  Yes Nita Sells, MD  QUEtiapine (SEROQUEL XR) 300 MG 24 hr tablet Take 2 tablets (600 mg total) by mouth at bedtime. Patient taking differently: Take 300 mg by mouth at bedtime.  01/09/18  Yes Cottle, Billey Co., MD  senna-docusate (PERI-COLACE) 8.6-50 MG tablet Take 2 tablets by mouth 2 (two) times daily. For constipation 07/31/16  Yes Nwoko, Herbert Pun I, NP  verapamil (VERELAN PM) 120 MG 24 hr capsule Take 1 capsule (120 mg total) by mouth at bedtime. For high blood pressure 07/31/16  Yes Nwoko, Herbert Pun I, NP  warfarin (COUMADIN) 2.5 MG tablet Take 1 tablet by mouth as directed by Coumadin clinic Patient taking differently: Take 2.5 mg by mouth See admin instructions. Take 1 tablet by mouth on Tues, Wed, Thus, Sat, Sun 02/17/18  Yes [name], Wonda Cheng, MD  warfarin (COUMADIN) 5 MG tablet TAKE 1 TABLET BY MOUTH ON MONDAYS, WEDNESDAYS, AND FRIDAYS Patient taking differently: Take 5 mg by mouth See admin instructions. TAKE 1 TABLET BY MOUTH ON MONDAYS AND FRIDAYS 03/05/18  Yes Deboraha Sprang, MD  ondansetron (ZOFRAN) 4 MG tablet Take 1 tablet (4 mg total) by mouth 3 (three) times daily as needed. As needed for nausea 07/31/16   Encarnacion Slates, NP    Family History Family History  Problem Relation Age of Onset  . Arthritis Mother   . Depression Mother   . Diabetes Mother   . Heart disease Mother   . Stroke Mother   . Early death Father   . Heart disease Father   . Atrial fibrillation Brother   . Hyperlipidemia Brother   . Multiple sclerosis Sister   . Alcohol abuse Brother   . Asthma Brother   . Drug abuse Brother   . Hypertension Brother   . Mental illness Brother     Social History Social History   Tobacco Use  . Smoking status: Never Smoker  . Smokeless tobacco: Never Used  Substance Use Topics  . Alcohol use: No  . Drug use: No      Allergies   Codeine; Adhesive [tape]; Celebrex [celecoxib]; Cephalexin; Sulfa antibiotics; Tricyclic antidepressants; Amoxicillin; Ampicillin; and Penicillins   Review of Systems Review of Systems  Constitutional: Negative for fever.  HENT: Positive for congestion and sore throat.   Eyes: Negative for visual disturbance.  Respiratory: Positive for cough. Negative for shortness of breath.   Cardiovascular: Negative for chest pain.  Gastrointestinal: Negative for abdominal pain, nausea and vomiting.  Genitourinary: Negative for difficulty urinating.  Musculoskeletal: Negative for back pain (chronic unchanged), myalgias and neck pain.  Skin: Positive for color change. Negative for rash.  Neurological: Positive for headaches (chronic, unchanged 1/10). Negative for syncope, weakness and numbness.     Physical Exam Updated Vital Signs BP 134/75   Pulse 72   Temp 99.4 F (37.4 C) (Oral)   Resp 18   Ht 5' (1.524 m)   Wt 86.2 kg   LMP  (LMP Unknown)   SpO2 96%   BMI 37.11 kg/m   Physical Exam Vitals signs and nursing note reviewed.  Constitutional:      General: She is not in acute distress.    Appearance: She is well-developed. She is not diaphoretic.  HENT:     Head: Normocephalic.     Comments: right supraorbital hematoma  Eyes:     Conjunctiva/sclera: Conjunctivae normal.  Neck:     Musculoskeletal: Normal range of motion.  Cardiovascular:     Rate and Rhythm: Normal rate and regular rhythm.     Heart sounds: Normal heart sounds. No murmur. No friction rub. No gallop.   Pulmonary:     Effort: Pulmonary effort is normal. No respiratory distress.     Breath sounds: Normal breath sounds. No wheezing or rales.  Abdominal:     General: There is no distension.     Palpations: Abdomen is soft.     Tenderness: There is no abdominal tenderness. There is no guarding.  Musculoskeletal:        General: No tenderness.  Skin:    General: Skin is warm and dry.      Findings: No erythema or rash.  Neurological:     Mental Status: She is alert and oriented to person, place, and time.      ED Treatments / Results  Labs (all labs ordered are listed, but only abnormal results are displayed) Labs Reviewed  CBC WITH DIFFERENTIAL/PLATELET - Abnormal; Notable for the following components:      Result Value   Abs Immature Granulocytes 0.28 (*)    All other components within normal limits  COMPREHENSIVE METABOLIC PANEL - Abnormal; Notable for the following components:   GFR calc non Af Amer 60 (*)    All other components within normal limits  PROTIME-INR - Abnormal; Notable for the following components:   Prothrombin Time 28.6 (*)    All other components within normal limits    EKG EKG Interpretation  Date/Time:  Saturday March 22 2018 11:56:30 EST Ventricular Rate:  89 PR Interval:    QRS Duration: 84 QT Interval:  339 QTC Calculation: 413 R Axis:   52 Text Interpretation:  Sinus rhythm Since prior ECG, pt is now in sinus rhythm Confirmed by Gareth Morgan [id number]) on 03/22/2018 12:23:24 PM   Radiology Dg Chest 2 View  Result Date: 03/22/2018 CLINICAL DATA:  Productive cough for 11 days. Atrial fibrillation. EXAM: CHEST - 2 VIEW COMPARISON:  None. FINDINGS: The heart size and mediastinal contours are within normal limits. Mild bibasilar scarring is noted. No evidence of pulmonary infiltrate or edema. No evidence of pleural effusion. Thoracic spine degenerative changes are seen as well as midthoracic spine fusion hardware. IMPRESSION: No active cardiopulmonary disease. Electronically Signed   By: Earle Gell M.D.   On: 03/22/2018 13:27   Ct Head Wo Contrast  Result Date: 03/22/2018 CLINICAL DATA:  Recent fall with trauma to the right supraorbital region, initial encounter EXAM: CT HEAD WITHOUT CONTRAST TECHNIQUE: Contiguous axial images were obtained from the base of the skull through the vertex without intravenous contrast. COMPARISON:  11/24/2017 FINDINGS: Brain: No evidence of acute infarction, hemorrhage, hydrocephalus, extra-axial collection or mass lesion/mass effect. Vascular: No hyperdense vessel or unexpected calcification. Skull: Bony calvarium is intact. Sinuses/Orbits: Mucosal thickening is noted within the right maxillary antrum is well as an acute air-fluid level likely related to the recent trauma. Some mild irregularity is noted of the lateral wall of the right maxillary antrum although no definitive fracture is seen on these images. Other: Right frontal scalp hematoma is noted related to the recent injury. It lies just above the right orbit. IMPRESSION: No acute intracranial abnormality noted. Soft tissue hematoma in the right forehead consistent with the recent injury. Mucosal thickening and air-fluid level in the right maxillary antrum likely related to the recent injury. No definitive fracture is noted. The need for further evaluation by maxillofacial CT can be determined on a clinical basis. Electronically Signed   By: Inez Catalina M.D.   On: 03/22/2018 13:25    Procedures Procedures (including critical care time)  Medications Ordered in ED Medications - No data to display   Initial Impression / Assessment and Plan / ED Course  I have reviewed the triage vital signs and the nursing notes.  Pertinent labs & imaging results that were available during my care of the patient were reviewed by me and considered in my medical decision making (see chart for details).     67yo female with history above including atrial fibrillation on coumadin presents with concern for fall with head trauma. Also reports cough for 10 days, XR shows no sign of pneumonia. Suspect viral etiology. Regarding fall, denies other areas of acute pain or injury other than head.  CT head shows no evidence of intracranial abnormality.  Patient discharged in stable condition with understanding of reasons to return.   Final Clinical Impressions(s) /  ED Diagnoses   Final diagnoses:  Fall, initial encounter  Contusion of face, initial encounter  Upper respiratory tract infection, unspecified type    ED Discharge Orders    None       Gareth Morgan, MD 03/22/18 2120

## 2018-03-22 NOTE — ED Notes (Signed)
IV has been taken out 

## 2018-03-22 NOTE — ED Triage Notes (Signed)
Per EMS pt from [place].  While walking into [employer] her shoe got caught up on the linoleum and she fell and hit the right side of her head just above her eye.  Denies LOC and complains only pain at site of injury 3/10.  Pt has large hemotoma above her eye where she fell.  Pt is on coumadin for AFIB.  Pt has recent history of fall where she tripped sep 2019 and had some confusion.  NAD noted at this time currently AOx4

## 2018-03-22 NOTE — ED Notes (Signed)
Patient given discharge instructions and verbalized understanding.  Patient stable to discharge at this time.  Patient is alert and oriented to baseline.  No distressed noted at this time.  All belongings taken with the patient at discharge.   

## 2018-03-22 NOTE — ED Notes (Signed)
Left for CT

## 2018-03-30 ENCOUNTER — Other Ambulatory Visit: Payer: Self-pay | Admitting: Psychiatry

## 2018-03-30 DIAGNOSIS — F411 Generalized anxiety disorder: Secondary | ICD-10-CM

## 2018-03-30 DIAGNOSIS — F4310 Post-traumatic stress disorder, unspecified: Secondary | ICD-10-CM

## 2018-03-31 NOTE — Telephone Encounter (Signed)
Clarify [facility] or [facility] for RX's

## 2018-04-02 ENCOUNTER — Ambulatory Visit (INDEPENDENT_AMBULATORY_CARE_PROVIDER_SITE_OTHER): Payer: Medicare Other | Admitting: Psychology

## 2018-04-02 DIAGNOSIS — F3181 Bipolar II disorder: Secondary | ICD-10-CM

## 2018-04-04 ENCOUNTER — Ambulatory Visit (INDEPENDENT_AMBULATORY_CARE_PROVIDER_SITE_OTHER): Payer: Medicare Other | Admitting: *Deleted

## 2018-04-04 DIAGNOSIS — I480 Paroxysmal atrial fibrillation: Secondary | ICD-10-CM | POA: Diagnosis not present

## 2018-04-04 DIAGNOSIS — Z5181 Encounter for therapeutic drug level monitoring: Secondary | ICD-10-CM

## 2018-04-04 LAB — POCT INR: INR: 2.3 (ref 2.0–3.0)

## 2018-04-04 NOTE — Patient Instructions (Signed)
Description   Continue on same dosage 2.5mg  daily except 5mg s on Monday and Fridays.  Recheck INR in 6 week.  Call with any medication changes or procedures: [facility] [phone] Main [phone]

## 2018-04-08 DIAGNOSIS — G518 Other disorders of facial nerve: Secondary | ICD-10-CM | POA: Diagnosis not present

## 2018-04-08 DIAGNOSIS — G43719 Chronic migraine without aura, intractable, without status migrainosus: Secondary | ICD-10-CM | POA: Diagnosis not present

## 2018-04-08 DIAGNOSIS — M791 Myalgia, unspecified site: Secondary | ICD-10-CM | POA: Diagnosis not present

## 2018-04-08 DIAGNOSIS — M542 Cervicalgia: Secondary | ICD-10-CM | POA: Diagnosis not present

## 2018-04-16 ENCOUNTER — Ambulatory Visit (INDEPENDENT_AMBULATORY_CARE_PROVIDER_SITE_OTHER): Payer: Medicare Other | Admitting: Psychology

## 2018-04-16 DIAGNOSIS — G894 Chronic pain syndrome: Secondary | ICD-10-CM | POA: Diagnosis not present

## 2018-04-16 DIAGNOSIS — F3181 Bipolar II disorder: Secondary | ICD-10-CM | POA: Diagnosis not present

## 2018-04-16 DIAGNOSIS — M797 Fibromyalgia: Secondary | ICD-10-CM | POA: Diagnosis not present

## 2018-04-16 DIAGNOSIS — M47816 Spondylosis without myelopathy or radiculopathy, lumbar region: Secondary | ICD-10-CM | POA: Diagnosis not present

## 2018-04-16 DIAGNOSIS — Z79899 Other long term (current) drug therapy: Secondary | ICD-10-CM | POA: Diagnosis not present

## 2018-04-16 DIAGNOSIS — Z79891 Long term (current) use of opiate analgesic: Secondary | ICD-10-CM | POA: Diagnosis not present

## 2018-04-24 ENCOUNTER — Ambulatory Visit (INDEPENDENT_AMBULATORY_CARE_PROVIDER_SITE_OTHER): Payer: Medicare Other | Admitting: Psychiatry

## 2018-04-24 ENCOUNTER — Encounter: Payer: Self-pay | Admitting: Psychiatry

## 2018-04-24 DIAGNOSIS — F3131 Bipolar disorder, current episode depressed, mild: Secondary | ICD-10-CM | POA: Diagnosis not present

## 2018-04-24 DIAGNOSIS — F5105 Insomnia due to other mental disorder: Secondary | ICD-10-CM

## 2018-04-24 DIAGNOSIS — F4001 Agoraphobia with panic disorder: Secondary | ICD-10-CM | POA: Diagnosis not present

## 2018-04-24 DIAGNOSIS — F4310 Post-traumatic stress disorder, unspecified: Secondary | ICD-10-CM

## 2018-04-24 DIAGNOSIS — F411 Generalized anxiety disorder: Secondary | ICD-10-CM | POA: Diagnosis not present

## 2018-04-24 DIAGNOSIS — S060X0D Concussion without loss of consciousness, subsequent encounter: Secondary | ICD-10-CM

## 2018-04-24 MED ORDER — METFORMIN HCL 500 MG PO TABS: ORAL_TABLET | ORAL | 1 refills | Status: DC
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
## 2018-04-25 ENCOUNTER — Other Ambulatory Visit: Payer: Self-pay

## 2018-04-25 MED ORDER — METFORMIN HCL 500 MG PO TABS: ORAL_TABLET | ORAL | 1 refills | Status: DC

## 2018-04-25 NOTE — Progress Notes (Signed)
rx clarified with pharmacy on Metformin 500mg  and resubmitted to [facility] #[id number]

## 2018-04-28 ENCOUNTER — Other Ambulatory Visit: Payer: Self-pay | Admitting: Cardiovascular Disease

## 2018-04-29 ENCOUNTER — Ambulatory Visit (INDEPENDENT_AMBULATORY_CARE_PROVIDER_SITE_OTHER): Payer: Medicare Other | Admitting: Family Medicine

## 2018-04-29 ENCOUNTER — Encounter: Payer: Self-pay | Admitting: Family Medicine

## 2018-04-29 VITALS — BP 120/60 | HR 77 | Temp 98.0°F | Resp 12 | Ht 60.0 in | Wt 195.5 lb

## 2018-04-29 DIAGNOSIS — L02211 Cutaneous abscess of abdominal wall: Secondary | ICD-10-CM | POA: Diagnosis not present

## 2018-04-29 DIAGNOSIS — L723 Sebaceous cyst: Secondary | ICD-10-CM | POA: Diagnosis not present

## 2018-04-29 MED ORDER — MUPIROCIN 2 % EX OINT: 1.0000 "application " | TOPICAL_OINTMENT | Freq: Two times a day (BID) | CUTANEOUS | 0 refills | Status: DC

## 2018-04-29 MED ORDER — CLINDAMYCIN HCL 150 MG PO CAPS: 150.0000 mg | ORAL_CAPSULE | Freq: Three times a day (TID) | ORAL | 0 refills | Status: AC

## 2018-04-29 NOTE — Progress Notes (Signed)
ACUTE VISIT   HPI:  Chief Complaint  Patient presents with  . Bump on left side of abdomen    red, painful bump/boil on left side of abdomen that patient noticed on Sunday    Ms.RENEKA NEBERGALL is a 68 y.o. female, who is here today complaining of tender lesion in abdomen, "very painful." Constant pain, exacerbated by movement and contact with clothes.  No Hx of trauma.  She has not noted fever,chills,or body aches. She has not tried OTC treatments.  Lesions it is getting worse.  No prior Hx of abdominal wall abscess.   Review of Systems  Constitutional: Negative for appetite change, chills, fatigue and fever.  Respiratory: Negative for shortness of breath and wheezing.   Gastrointestinal: Negative for abdominal pain, diarrhea, nausea and vomiting.  Musculoskeletal: Negative for arthralgias, gait problem and myalgias.  Skin: Negative for rash and wound.    Current Outpatient Medications on File Prior to Visit  Medication Sig Dispense Refill  . aspirin 325 MG tablet Take 325 mg by mouth daily.    . baclofen (LIORESAL) 10 MG tablet Take 10 mg by mouth 2 (two) times daily as needed.    . busPIRone (BUSPAR) 15 MG tablet TAKE 1/3 TABLET BY MOUTH FOR 5 DAYS, THEN 2/3 TABLET FOR 5 DAYS, THEN 1 TABLET BY MOUTH TWICE DAILY 60 tablet 1  . calcium carbonate (OS-CAL - DOSED IN MG OF ELEMENTAL CALCIUM) 1250 (500 Ca) MG tablet Take 1 tablet (500 mg of elemental calcium total) by mouth daily. For bone health    . Cholecalciferol (VITAMIN D3) 1000 units CAPS Take 5,000 Units by mouth daily.     . clotrimazole (LOTRIMIN) 1 % cream APPLY TO AFFECTED AREA TWICE A DAY    . dextromethorphan-guaiFENesin (MUCINEX DM) 30-600 MG 12hr tablet Take 1 tablet by mouth 2 (two) times daily.    . diphenhydrAMINE (BENADRYL ALLERGY) 25 mg capsule Take 50 mg by mouth 3 (three) times daily.     Eduard Roux (AIMOVIG) 70 MG/ML SOAJ Inject 70 mg into the skin every 30 (thirty) days.    [name] estradiol  (ESTRACE) 1 MG tablet Take 1.5 tablets (1.5 mg total) by mouth daily. For hormone replacement (Patient taking differently: Take 1 mg by mouth daily. For hormone replacement) 135 tablet 2  . ferrous sulfate 325 (65 FE) MG tablet Take 1 tablet (325 mg total) by mouth daily with breakfast. Ferrous sulfate  3  . HYDROcodone-acetaminophen (NORCO) 7.5-325 MG tablet Take 1 tablet by mouth 2 (two) times daily as needed. 2 tablet 0  . lamoTRIgine (LAMICTAL) 100 MG tablet Take 1 tablet (100 mg total) by mouth 2 (two) times daily. (Patient taking differently: Take 200 mg by mouth daily. ) 180 tablet 0  . levothyroxine (SYNTHROID, LEVOTHROID) 100 MCG tablet Take 100 mcg by mouth daily before breakfast.    . Melatonin 5 MG TABS Take 30 mg by mouth at bedtime.     . metFORMIN (GLUCOPHAGE) 500 MG tablet 1 tablet with evening meal for 2 weeks then 1 twice daily with meals 60 tablet 1  . metoprolol succinate (TOPROL-XL) 50 MG 24 hr tablet Take 1 tablet by mouth daily with or immediately following a meal. 30 tablet 11  . ondansetron (ZOFRAN) 4 MG tablet Take 1 tablet (4 mg total) by mouth 3 (three) times daily as needed. As needed for nausea 1 tablet 0  . pantoprazole (PROTONIX) 40 MG tablet Take 40 mg by mouth daily.    [name]  pravastatin (PRAVACHOL) 20 MG tablet Take 20 mg by mouth daily.  3  . pregabalin (LYRICA) 150 MG capsule Take 1 capsule (150 mg total) by mouth 3 (three) times daily. For nerve pain 2 capsule 0  . QUEtiapine (SEROQUEL XR) 300 MG 24 hr tablet Take 2 tablets (600 mg total) by mouth at bedtime. (Patient taking differently: Take 150 mg by mouth at bedtime. ) 90 tablet 3  . senna-docusate (PERI-COLACE) 8.6-50 MG tablet Take 2 tablets by mouth 2 (two) times daily. For constipation    . verapamil (VERELAN PM) 120 MG 24 hr capsule Take 1 capsule (120 mg total) by mouth at bedtime. For high blood pressure    . warfarin (COUMADIN) 2.5 MG tablet TAKE 1 TABLET BY MOUTH AS DIRECTED BY COUMADIN CLINIC 25 tablet 1    . warfarin (COUMADIN) 5 MG tablet TAKE 1 TABLET BY MOUTH ON MONDAYS, WEDNESDAYS, AND FRIDAYS (Patient taking differently: Take 5 mg by mouth See admin instructions. TAKE 1 TABLET BY MOUTH ON MONDAYS AND FRIDAYS) 30 tablet 1   No current facility-administered medications on file prior to visit.      Past Medical History:  Diagnosis Date  . Anxiety   . Atrial fibrillation ([place])   . Bipolar 2 disorder ([place])   . Depression   . History of cardioversion    x2  . Hyperlipidemia   . Hypothyroidism   . Migraine headache   . Osteoporosis    Allergies  Allergen Reactions  . Codeine Anaphylaxis    Patient states she can take codeine but not percocet   . Adhesive [Tape]     blister  . Celebrex [Celecoxib] Hives  . Cephalexin   . Sulfa Antibiotics Hives  . Tricyclic Antidepressants     Doesn't help  . Amoxicillin Rash  . Ampicillin Rash  . Penicillins Rash    Did it involve swelling of the face/tongue/throat, SOB, or low BP? Yes Did it involve sudden or severe rash/hives, skin peeling, or any reaction on the inside of your mouth or nose? NO Did you need to seek medical attention at a hospital or doctor's office? NO When did it last happen? childhood If all above answers are "NO", may proceed with cephalosporin use.    Social History   Socioeconomic History  . Marital status: Single    Spouse name: Not on file  . Number of children: 3  . Years of education: Not on file  . Highest education level: Not on file  Occupational History  . Not on file  Social Needs  . Financial resource strain: Not on file  . Food insecurity:    Worry: Not on file    Inability: Not on file  . Transportation needs:    Medical: Not on file    Non-medical: Not on file  Tobacco Use  . Smoking status: Never Smoker  . Smokeless tobacco: Never Used  Substance and Sexual Activity  . Alcohol use: No  . Drug use: No  . Sexual activity: Not on file  Lifestyle  . Physical activity:    Days  per week: Not on file    Minutes per session: Not on file  . Stress: Not on file  Relationships  . Social connections:    Talks on phone: Not on file    Gets together: Not on file    Attends religious service: Not on file    Active member of club or organization: Not on file    Attends meetings  of clubs or organizations: Not on file    Relationship status: Not on file  Other Topics Concern  . Not on file  Social History Narrative  . Not on file    Vitals:   04/29/18 1435  BP: 120/60  Pulse: 77  Resp: 12  Temp: 98 F (36.7 C)  SpO2: 98%   Body mass index is 38.18 kg/m.   Physical Exam  Nursing note and vitals reviewed. Constitutional: She is oriented to person, place, and time. She appears well-developed. No distress.  HENT:  Head: Normocephalic and atraumatic.  Mouth/Throat: Mucous membranes are normal.  Eyes: Conjunctivae are normal.  Respiratory: Effort normal and breath sounds normal. No respiratory distress.  GI: Soft. She exhibits no mass. There is no abdominal tenderness.  Musculoskeletal:        General: No edema.  Neurological: She is alert and oriented to person, place, and time.  Skin: Skin is warm. Lesion noted. No rash noted. There is erythema.     Erythematous indurated area in LUQ, tender,not fluctuant area. Dark dimple in center. Se picture (taken after verbal consent)  Psychiatric: She has a normal mood and affect.  Well groomed, good eye contact.        ASSESSMENT AND PLAN:   Ms. Elaiza was seen today for bump on left side of abdomen.  Diagnoses and all orders for this visit:  Abscess of abdominal wall A few abx allergies,not well tolerated due to GI side effects. She agrees with Clindamycin, recommend also taking a probiotic. Also topical Mupirocin. Local heat. Instructed about warning signs.  -     clindamycin (CLEOCIN) 150 MG capsule; Take 1 capsule (150 mg total) by mouth 3 (three) times daily for 5 days. -     mupirocin  ointment (BACTROBAN) 2 %; Apply 1 application topically 2 (two) times daily.  Sebaceous cyst If abscess/infection process becomes recurrent, removal can be considered. F/U as needed.     Return if symptoms worsen or fail to improve.    Betty G. Martinique, MD  [facility]. [place] office.

## 2018-04-29 NOTE — Patient Instructions (Signed)
A few things to remember from today's visit:   Abscess of abdominal wall - Plan: clindamycin (CLEOCIN) 150 MG capsule, mupirocin ointment (BACTROBAN) 2 %   Epidermal Cyst  An epidermal cyst is a small, painless lump under your skin. The cyst contains a grayish-white, bad-smelling substance (keratin). Do not try to pop or open an epidermal cyst yourself. What are the causes?  A blocked hair follicle.  A hair that curls and re-enters the skin instead of growing straight out of the skin.  A blocked pore.  Irritated skin.  An injury to the skin.  Certain conditions that are passed along from parent to child (inherited).  Human papillomavirus (HPV).  Long-term sun damage to the skin. What increases the risk?  Having acne.  Being overweight.  Being 7-70 years old. What are the signs or symptoms? These cysts are usually harmless, but they can get infected. Symptoms of infection may include:  Redness.  Inflammation.  Tenderness.  Warmth.  Fever.  A grayish-white, bad-smelling substance drains from the cyst.  Pus drains from the cyst. How is this treated? In many cases, epidermal cysts go away on their own without treatment. If a cyst becomes infected, treatment may include:  Opening and draining the cyst, done by a doctor. After draining, you may need minor surgery to remove the rest of the cyst.  Antibiotic medicine.  Shots of medicines (steroids) that help to reduce inflammation.  Surgery to remove the cyst. Surgery may be done if the cyst: ? Becomes large. ? Bothers you. ? Has a chance of turning into cancer.  Do not try to open a cyst yourself. Follow these instructions at home:  Take over-the-counter and prescription medicines only as told by your doctor.  If you were prescribed an antibiotic medicine, take it it as told by your doctor. Do not stop using the antibiotic even if you start to feel better.  Keep the area around your cyst clean and  dry.  Wear loose, dry clothing.  Avoid touching your cyst.  Check your cyst every day for signs of infection. Check for: ? Redness, swelling, or pain. ? Fluid or blood. ? Warmth. ? Pus or a bad smell.  Keep all follow-up visits as told by your doctor. This is important. How is this prevented?  Wear clean, dry, clothing.  Avoid wearing tight clothing.  Keep your skin clean and dry. Take showers or baths every day. Contact a doctor if:  Your cyst has symptoms of infection.  Your condition does not improve or gets worse.  You have a cyst that looks different from other cysts you have had.  You have a fever. Get help right away if:  Redness spreads from the cyst into the area close by. Summary  An epidermal cyst is a sac made of skin tissue.  If a cyst becomes infected, treatment may include surgery to open and drain the cyst, or to remove it.  Take over-the-counter and prescription medicines only as told by your doctor.  Contact a doctor if your condition is not improving or is getting worse.  Keep all follow-up visits as told by your doctor. This is important. This information is not intended to replace advice given to you by your health care provider. Make sure you discuss any questions you have with your health care provider. Document Released: 03/22/2004 Document Revised: 08/26/2017 Document Reviewed: 12/15/2014 Elsevier Interactive Patient Education  2019 [employer].  Please be sure medication list is accurate. If a new  problem present, please set up appointment sooner than planned today.

## 2018-04-30 ENCOUNTER — Ambulatory Visit (INDEPENDENT_AMBULATORY_CARE_PROVIDER_SITE_OTHER): Payer: Medicare Other | Admitting: Psychology

## 2018-04-30 DIAGNOSIS — F3181 Bipolar II disorder: Secondary | ICD-10-CM

## 2018-05-05 ENCOUNTER — Other Ambulatory Visit: Payer: Self-pay | Admitting: Family Medicine

## 2018-05-05 DIAGNOSIS — M542 Cervicalgia: Secondary | ICD-10-CM | POA: Diagnosis not present

## 2018-05-05 DIAGNOSIS — G518 Other disorders of facial nerve: Secondary | ICD-10-CM | POA: Diagnosis not present

## 2018-05-05 DIAGNOSIS — G43719 Chronic migraine without aura, intractable, without status migrainosus: Secondary | ICD-10-CM | POA: Diagnosis not present

## 2018-05-05 DIAGNOSIS — M791 Myalgia, unspecified site: Secondary | ICD-10-CM | POA: Diagnosis not present

## 2018-05-05 NOTE — Telephone Encounter (Signed)
Copied from [place] [id number]. Topic: Quick Communication - Rx Refill/Question >> May 05, 2018  9:46 AM Alanda Slim E wrote: Medication: levothyroxine (SYNTHROID, LEVOTHROID) 100 MCG tablet   Has the patient contacted their pharmacy? Yes - no refills and since Pt has new PCP it needs to be called or sent In   Preferred Pharmacy (with phone number or street name): [id number] [place], [place] [phone] (Phone) [phone] (Fax)    Agent: Please be advised that RX refills may take up to 3 business days. We ask that you follow-up with your pharmacy.

## 2018-05-05 NOTE — Telephone Encounter (Signed)
Requested medication (s) are due for refill today:  yes  Requested medication (s) are on the active medication list:  yes  Future visit scheduled:  no  Last Refill: historical medication   Requested Prescriptions  Pending Prescriptions Disp Refills   levothyroxine (SYNTHROID, LEVOTHROID) 100 MCG tablet      Sig: Take 1 tablet (100 mcg total) by mouth daily before breakfast.     Endocrinology:  Hypothyroid Agents Failed - 05/05/2018 10:04 AM      Failed - TSH needs to be rechecked within 3 months after an abnormal result. Refill until TSH is due.      Passed - TSH in normal range and within 360 days    TSH  Date Value Ref Range Status  11/24/2017 4.349 0.350 - 4.500 uIU/mL Final    Comment:    Performed by a 3rd Generation assay with a functional sensitivity of <=0.01 uIU/mL. Performed at [facility], [place] [address] [place], [place] [address]          Passed - Valid encounter within last 12 months    Recent Outpatient Visits          6 days ago Abscess of abdominal wall   [place] [facility], Malka So, MD   2 months ago Menopausal syndrome (hot flashes)   [facility], Malka So, MD

## 2018-05-06 MED ORDER — LEVOTHYROXINE SODIUM 100 MCG PO TABS: 100.0000 ug | ORAL_TABLET | Freq: Every day | ORAL | 3 refills | Status: DC

## 2018-05-14 ENCOUNTER — Ambulatory Visit (INDEPENDENT_AMBULATORY_CARE_PROVIDER_SITE_OTHER): Payer: Medicare Other | Admitting: Psychology

## 2018-05-14 DIAGNOSIS — F3181 Bipolar II disorder: Secondary | ICD-10-CM | POA: Diagnosis not present

## 2018-05-15 ENCOUNTER — Telehealth: Payer: Self-pay

## 2018-05-15 NOTE — Telephone Encounter (Signed)
Patient returned call, please call back  

## 2018-05-15 NOTE — Telephone Encounter (Signed)
UNABLE TO SCREEN LEFT MSG

## 2018-05-15 NOTE — Telephone Encounter (Signed)

## 2018-05-21 DIAGNOSIS — M47814 Spondylosis without myelopathy or radiculopathy, thoracic region: Secondary | ICD-10-CM | POA: Diagnosis not present

## 2018-05-21 DIAGNOSIS — G894 Chronic pain syndrome: Secondary | ICD-10-CM | POA: Diagnosis not present

## 2018-05-21 DIAGNOSIS — M797 Fibromyalgia: Secondary | ICD-10-CM | POA: Diagnosis not present

## 2018-05-21 DIAGNOSIS — M47816 Spondylosis without myelopathy or radiculopathy, lumbar region: Secondary | ICD-10-CM | POA: Diagnosis not present

## 2018-05-25 ENCOUNTER — Other Ambulatory Visit: Payer: Self-pay | Admitting: Psychiatry

## 2018-05-25 DIAGNOSIS — F411 Generalized anxiety disorder: Secondary | ICD-10-CM

## 2018-05-25 DIAGNOSIS — F4310 Post-traumatic stress disorder, unspecified: Secondary | ICD-10-CM

## 2018-05-27 ENCOUNTER — Telehealth: Payer: Self-pay

## 2018-05-27 NOTE — Telephone Encounter (Signed)
lmom for prescreen/drive thru 

## 2018-05-28 ENCOUNTER — Other Ambulatory Visit: Payer: Self-pay | Admitting: Pain Medicine

## 2018-05-28 ENCOUNTER — Other Ambulatory Visit: Payer: Self-pay

## 2018-05-28 ENCOUNTER — Ambulatory Visit (INDEPENDENT_AMBULATORY_CARE_PROVIDER_SITE_OTHER): Payer: Medicare Other | Admitting: Psychology

## 2018-05-28 ENCOUNTER — Ambulatory Visit (INDEPENDENT_AMBULATORY_CARE_PROVIDER_SITE_OTHER): Payer: Medicare Other | Admitting: Pharmacist

## 2018-05-28 DIAGNOSIS — Z5181 Encounter for therapeutic drug level monitoring: Secondary | ICD-10-CM

## 2018-05-28 DIAGNOSIS — F3181 Bipolar II disorder: Secondary | ICD-10-CM | POA: Diagnosis not present

## 2018-05-28 DIAGNOSIS — I480 Paroxysmal atrial fibrillation: Secondary | ICD-10-CM

## 2018-05-28 DIAGNOSIS — M545 Low back pain, unspecified: Secondary | ICD-10-CM

## 2018-05-28 LAB — POCT INR: INR: 2.9 (ref 2.0–3.0)

## 2018-06-05 ENCOUNTER — Telehealth: Payer: Self-pay | Admitting: Family Medicine

## 2018-06-05 NOTE — Telephone Encounter (Signed)
Message sent to Dr. Jordan for review and approval. 

## 2018-06-05 NOTE — Telephone Encounter (Signed)
Copied from [place] [id number]. Topic: Quick Communication - See Telephone Encounter >> Jun 05, 2018 10:52 AM Meredith [name] wrote: CRM for notification. See Telephone encounter for: 06/05/18. Pt said she seen Dr [place] on 3/3 for a cyst. She said she has had another one come up on her inner butt cheek which hurts when urinating. She would like to know could Dr [facility] call in another antibiotic or does she need a virtual ? [facility] [address] [facility]

## 2018-06-06 ENCOUNTER — Other Ambulatory Visit: Payer: Self-pay | Admitting: Family Medicine

## 2018-06-06 DIAGNOSIS — Z1231 Encounter for screening mammogram for malignant neoplasm of breast: Secondary | ICD-10-CM

## 2018-06-09 NOTE — Telephone Encounter (Signed)
I treated for abdominal wall abscess earlier 04/2018. Multiple abx allergies, so last time she was treated with Clindamycin. Frequent abx use and mostly Clindamycin increases risk for C diff infection.  She can try first sitz baths with warm water for a few days and if not any better or getting worse,she is going to be evaluated.  Thanks, BJ

## 2018-06-10 NOTE — Telephone Encounter (Signed)
Patient given recommendations per Dr. [facility]. Spoke with patient and she stated that cyst has opened up now since calling the office on last Thursday and she is fine now.  Nothing further needed at this time.

## 2018-06-11 ENCOUNTER — Ambulatory Visit (INDEPENDENT_AMBULATORY_CARE_PROVIDER_SITE_OTHER): Payer: Medicare Other | Admitting: Psychology

## 2018-06-11 DIAGNOSIS — F3181 Bipolar II disorder: Secondary | ICD-10-CM

## 2018-06-18 DIAGNOSIS — Z79891 Long term (current) use of opiate analgesic: Secondary | ICD-10-CM | POA: Diagnosis not present

## 2018-06-18 DIAGNOSIS — Z79899 Other long term (current) drug therapy: Secondary | ICD-10-CM | POA: Diagnosis not present

## 2018-06-18 DIAGNOSIS — M169 Osteoarthritis of hip, unspecified: Secondary | ICD-10-CM | POA: Diagnosis not present

## 2018-06-18 DIAGNOSIS — M797 Fibromyalgia: Secondary | ICD-10-CM | POA: Diagnosis not present

## 2018-06-18 DIAGNOSIS — M47816 Spondylosis without myelopathy or radiculopathy, lumbar region: Secondary | ICD-10-CM | POA: Diagnosis not present

## 2018-06-18 DIAGNOSIS — G894 Chronic pain syndrome: Secondary | ICD-10-CM | POA: Diagnosis not present

## 2018-06-23 ENCOUNTER — Other Ambulatory Visit: Payer: Self-pay

## 2018-06-23 ENCOUNTER — Ambulatory Visit (INDEPENDENT_AMBULATORY_CARE_PROVIDER_SITE_OTHER): Payer: Medicare Other | Admitting: Psychiatry

## 2018-06-23 ENCOUNTER — Encounter: Payer: Self-pay | Admitting: Psychiatry

## 2018-06-23 DIAGNOSIS — F4001 Agoraphobia with panic disorder: Secondary | ICD-10-CM | POA: Diagnosis not present

## 2018-06-23 DIAGNOSIS — F5105 Insomnia due to other mental disorder: Secondary | ICD-10-CM | POA: Diagnosis not present

## 2018-06-23 DIAGNOSIS — F3131 Bipolar disorder, current episode depressed, mild: Secondary | ICD-10-CM

## 2018-06-23 DIAGNOSIS — F4310 Post-traumatic stress disorder, unspecified: Secondary | ICD-10-CM | POA: Diagnosis not present

## 2018-06-23 DIAGNOSIS — F411 Generalized anxiety disorder: Secondary | ICD-10-CM | POA: Diagnosis not present

## 2018-06-23 MED ORDER — LAMOTRIGINE 100 MG PO TABS: 100.0000 mg | ORAL_TABLET | Freq: Two times a day (BID) | ORAL | 0 refills | Status: DC

## 2018-06-23 MED ORDER — METFORMIN HCL 500 MG PO TABS: 1000.0000 mg | ORAL_TABLET | Freq: Two times a day (BID) | ORAL | 1 refills | Status: DC
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
## 2018-06-25 ENCOUNTER — Ambulatory Visit (INDEPENDENT_AMBULATORY_CARE_PROVIDER_SITE_OTHER): Payer: Medicare Other | Admitting: Psychology

## 2018-06-25 DIAGNOSIS — F3181 Bipolar II disorder: Secondary | ICD-10-CM

## 2018-06-30 DIAGNOSIS — G43719 Chronic migraine without aura, intractable, without status migrainosus: Secondary | ICD-10-CM | POA: Diagnosis not present

## 2018-06-30 DIAGNOSIS — G518 Other disorders of facial nerve: Secondary | ICD-10-CM | POA: Diagnosis not present

## 2018-06-30 DIAGNOSIS — M791 Myalgia, unspecified site: Secondary | ICD-10-CM | POA: Diagnosis not present

## 2018-06-30 DIAGNOSIS — M542 Cervicalgia: Secondary | ICD-10-CM | POA: Diagnosis not present

## 2018-07-07 ENCOUNTER — Other Ambulatory Visit: Payer: Self-pay | Admitting: Family Medicine

## 2018-07-07 ENCOUNTER — Telehealth: Payer: Self-pay | Admitting: Family Medicine

## 2018-07-07 MED ORDER — PANTOPRAZOLE SODIUM 40 MG PO TBEC: 40.0000 mg | DELAYED_RELEASE_TABLET | Freq: Every day | ORAL | 0 refills | Status: AC

## 2018-07-07 NOTE — Telephone Encounter (Signed)
We have not discussed this problem. I sent Rx to her pharmacy,please arrange f/u appt before more refill are needed.  Thanks, BJ

## 2018-07-07 NOTE — Telephone Encounter (Signed)
Copied from [place]. Topic: Quick Communication - Rx Refill/Question >> Jul 07, 2018  2:17 PM Pauline Good wrote: Medication: Pantoprazole 40mg    Has the patient contacted their pharmacy? yes (Agent: If no, request that the patient contact the pharmacy for the refill.) (Agent: If yes, when and what did the pharmacy advise?) she called last week and is completely out  Preferred Pharmacy (with phone number or street name): [facility]  Agent: Please be advised that RX refills may take up to 3 business days. We ask that you follow-up with your pharmacy.

## 2018-07-07 NOTE — Telephone Encounter (Signed)
Is this okay to refill it was rx'd by a historical provider?

## 2018-07-09 ENCOUNTER — Ambulatory Visit (INDEPENDENT_AMBULATORY_CARE_PROVIDER_SITE_OTHER): Payer: Medicare Other | Admitting: Psychology

## 2018-07-09 DIAGNOSIS — F3181 Bipolar II disorder: Secondary | ICD-10-CM | POA: Diagnosis not present

## 2018-07-14 ENCOUNTER — Other Ambulatory Visit: Payer: Self-pay

## 2018-07-16 DIAGNOSIS — M797 Fibromyalgia: Secondary | ICD-10-CM | POA: Diagnosis not present

## 2018-07-16 DIAGNOSIS — G5600 Carpal tunnel syndrome, unspecified upper limb: Secondary | ICD-10-CM | POA: Diagnosis not present

## 2018-07-16 DIAGNOSIS — G894 Chronic pain syndrome: Secondary | ICD-10-CM | POA: Diagnosis not present

## 2018-07-16 DIAGNOSIS — M169 Osteoarthritis of hip, unspecified: Secondary | ICD-10-CM | POA: Diagnosis not present

## 2018-07-23 ENCOUNTER — Other Ambulatory Visit: Payer: Self-pay | Admitting: Psychiatry

## 2018-07-23 ENCOUNTER — Ambulatory Visit (INDEPENDENT_AMBULATORY_CARE_PROVIDER_SITE_OTHER): Payer: Medicare Other | Admitting: Psychology

## 2018-07-23 ENCOUNTER — Telehealth: Payer: Self-pay

## 2018-07-23 DIAGNOSIS — F3181 Bipolar II disorder: Secondary | ICD-10-CM | POA: Diagnosis not present

## 2018-07-23 DIAGNOSIS — F4310 Post-traumatic stress disorder, unspecified: Secondary | ICD-10-CM

## 2018-07-23 DIAGNOSIS — F411 Generalized anxiety disorder: Secondary | ICD-10-CM

## 2018-07-23 NOTE — Telephone Encounter (Signed)
1. COVID-19 Pre-Screening Questions:  . In the past 7 to 10 days have you had a cough,  shortness of breath, headache, congestion, fever (100 or greater) body aches, chills, sore throat, or sudden loss of taste or sense of smell? . Have you been around anyone with known Covid 19. . Have you been around anyone who is awaiting Covid 19 test results in the past 7 to 10 days? . Have you been around anyone who has been exposed to Covid 19, or has mentioned symptoms of Covid 19 within the past 7 to 10 days? NO to all answers  2. Pt advised of visitor restrictions (no visitors allowed except if needed to conduct the visit). Also advised to arrive at appointment time and wear a mask.

## 2018-07-23 NOTE — Telephone Encounter (Signed)
lmom for prescreen  

## 2018-07-24 ENCOUNTER — Other Ambulatory Visit: Payer: Self-pay

## 2018-07-24 ENCOUNTER — Other Ambulatory Visit: Payer: Self-pay | Admitting: Family Medicine

## 2018-07-24 ENCOUNTER — Ambulatory Visit (INDEPENDENT_AMBULATORY_CARE_PROVIDER_SITE_OTHER): Payer: Medicare Other | Admitting: Pharmacist

## 2018-07-24 DIAGNOSIS — Z5181 Encounter for therapeutic drug level monitoring: Secondary | ICD-10-CM

## 2018-07-24 DIAGNOSIS — I480 Paroxysmal atrial fibrillation: Secondary | ICD-10-CM | POA: Diagnosis not present

## 2018-07-24 LAB — POCT INR: INR: 4.8 — AB (ref 2.0–3.0)

## 2018-07-27 ENCOUNTER — Other Ambulatory Visit: Payer: Self-pay | Admitting: Cardiovascular Disease

## 2018-07-28 DIAGNOSIS — M542 Cervicalgia: Secondary | ICD-10-CM | POA: Diagnosis not present

## 2018-07-28 DIAGNOSIS — M791 Myalgia, unspecified site: Secondary | ICD-10-CM | POA: Diagnosis not present

## 2018-07-28 DIAGNOSIS — G518 Other disorders of facial nerve: Secondary | ICD-10-CM | POA: Diagnosis not present

## 2018-07-28 DIAGNOSIS — G43719 Chronic migraine without aura, intractable, without status migrainosus: Secondary | ICD-10-CM | POA: Diagnosis not present

## 2018-07-29 NOTE — Telephone Encounter (Signed)
Noted. BJ 

## 2018-07-29 NOTE — Telephone Encounter (Signed)
Pt called and asked to speak with the manager regarding her refill request, as Rachel Bo is out of the office I researched the request. The pt is asking for a refill on clotrimazole cream, we have never prescribed this for the pt.   Per [facility] this rx comes from Dr. Kathyrn Lass, they went ahead and sent refill request.   Spoke with pt to advise her since we have not seen/treated her for any skin conditions she would need to either schedule a visit or contact Dr. [name] Heck for refill. She is upset that she is being asked "to come in and see her doctor for every refill she needs", I attempted to explain to the pt that since she is new to our practice and we have not treated her for the condition she is asking for the clotrimazole cream for (a rash under her breasts) we would need to see her for a visit to be able to document the need for the prescription. She still is upset and asking why, since we have her medical records and her prescription information, she is being asked to have an office visit. I again attempted to explain the need for documentation of conditions Dr. Martinique has not treated her for - she asked why this wasn't done in the first visit. I then explained this visit was to establish care and was not meant to review her entire medical history. I explained that when she needs medications refilled that were previously filled by her former PCP we would need documentation as to the condition associated with the prescription, and the best way to do this is in an office visit. The pt was still very upset and continued to ask why she had to be seen for every refill she needs. She finally advised she is going to consider "finding a new provider that will give her all her refills without having to see her all the time". I apologized for not being able to explain the situation any clearer, and advised her the refill request for the clotrimazole has been sent to Dr. [name] Heck.   Nothing further needed at this time.

## 2018-07-29 NOTE — Telephone Encounter (Signed)
Please advise. Patient was last seen 04/29/2018. Does she need an appointment for this refill?

## 2018-07-29 NOTE — Telephone Encounter (Signed)
Patient states that she just saw PCP regarding this medication on 05/16/18.  And is wondering why she needs an appt for this medication.  Please advise Call back # [phone]

## 2018-07-30 DIAGNOSIS — M797 Fibromyalgia: Secondary | ICD-10-CM | POA: Diagnosis not present

## 2018-07-30 DIAGNOSIS — M8580 Other specified disorders of bone density and structure, unspecified site: Secondary | ICD-10-CM | POA: Diagnosis not present

## 2018-07-30 DIAGNOSIS — E559 Vitamin D deficiency, unspecified: Secondary | ICD-10-CM | POA: Diagnosis not present

## 2018-07-30 DIAGNOSIS — E039 Hypothyroidism, unspecified: Secondary | ICD-10-CM | POA: Diagnosis not present

## 2018-07-30 DIAGNOSIS — R21 Rash and other nonspecific skin eruption: Secondary | ICD-10-CM | POA: Diagnosis not present

## 2018-07-30 DIAGNOSIS — D649 Anemia, unspecified: Secondary | ICD-10-CM | POA: Diagnosis not present

## 2018-07-30 DIAGNOSIS — K219 Gastro-esophageal reflux disease without esophagitis: Secondary | ICD-10-CM | POA: Diagnosis not present

## 2018-07-30 DIAGNOSIS — F3181 Bipolar II disorder: Secondary | ICD-10-CM | POA: Diagnosis not present

## 2018-07-30 DIAGNOSIS — E538 Deficiency of other specified B group vitamins: Secondary | ICD-10-CM | POA: Diagnosis not present

## 2018-07-30 DIAGNOSIS — N183 Chronic kidney disease, stage 3 (moderate): Secondary | ICD-10-CM | POA: Diagnosis not present

## 2018-07-30 DIAGNOSIS — I480 Paroxysmal atrial fibrillation: Secondary | ICD-10-CM | POA: Diagnosis not present

## 2018-07-30 DIAGNOSIS — F5101 Primary insomnia: Secondary | ICD-10-CM | POA: Diagnosis not present

## 2018-07-31 ENCOUNTER — Telehealth: Payer: Self-pay

## 2018-07-31 NOTE — Telephone Encounter (Signed)

## 2018-08-01 ENCOUNTER — Other Ambulatory Visit: Payer: Self-pay

## 2018-08-01 ENCOUNTER — Ambulatory Visit (INDEPENDENT_AMBULATORY_CARE_PROVIDER_SITE_OTHER): Payer: Medicare Other | Admitting: *Deleted

## 2018-08-01 ENCOUNTER — Ambulatory Visit
Admission: RE | Admit: 2018-08-01 | Discharge: 2018-08-01 | Disposition: A | Discharge status: Home / Self Care | Payer: Medicare Other | Source: Ambulatory Visit | Attending: Pain Medicine | Admitting: Pain Medicine

## 2018-08-01 DIAGNOSIS — Z5181 Encounter for therapeutic drug level monitoring: Secondary | ICD-10-CM

## 2018-08-01 DIAGNOSIS — M545 Low back pain, unspecified: Secondary | ICD-10-CM

## 2018-08-01 DIAGNOSIS — I480 Paroxysmal atrial fibrillation: Secondary | ICD-10-CM

## 2018-08-01 LAB — POCT INR: INR: 4.1 — AB (ref 2.0–3.0)

## 2018-08-01 NOTE — Patient Instructions (Addendum)
Description   Hold coumadin today and tomorrow then change dosage 2.5mg  daily except 5mg s on Fridays.  Recheck INR in 1 week.  Call with any medication changes or procedures: [facility] [phone] Main [phone]

## 2018-08-06 ENCOUNTER — Ambulatory Visit (INDEPENDENT_AMBULATORY_CARE_PROVIDER_SITE_OTHER): Payer: Medicare Other | Admitting: Psychology

## 2018-08-06 DIAGNOSIS — F3181 Bipolar II disorder: Secondary | ICD-10-CM

## 2018-08-07 ENCOUNTER — Other Ambulatory Visit: Payer: Self-pay

## 2018-08-07 ENCOUNTER — Ambulatory Visit
Admission: RE | Admit: 2018-08-07 | Discharge: 2018-08-07 | Disposition: A | Discharge status: Home / Self Care | Payer: Medicare Other | Source: Ambulatory Visit | Attending: Family Medicine | Admitting: Family Medicine

## 2018-08-07 ENCOUNTER — Other Ambulatory Visit: Payer: Self-pay | Admitting: Family Medicine

## 2018-08-07 DIAGNOSIS — Z1231 Encounter for screening mammogram for malignant neoplasm of breast: Secondary | ICD-10-CM | POA: Diagnosis not present

## 2018-08-08 ENCOUNTER — Ambulatory Visit (INDEPENDENT_AMBULATORY_CARE_PROVIDER_SITE_OTHER): Payer: Medicare Other

## 2018-08-08 DIAGNOSIS — I480 Paroxysmal atrial fibrillation: Secondary | ICD-10-CM

## 2018-08-08 DIAGNOSIS — Z5181 Encounter for therapeutic drug level monitoring: Secondary | ICD-10-CM | POA: Diagnosis not present

## 2018-08-08 LAB — POCT INR: INR: 2.2 (ref 2.0–3.0)

## 2018-08-08 NOTE — Patient Instructions (Signed)
Description   Continue on same dosage 2.5mg  daily except 5mg s on Fridays.  Recheck INR in 2 weeks. Call with any medication changes or procedures: [facility] [phone] Main [phone]

## 2018-08-13 DIAGNOSIS — M47816 Spondylosis without myelopathy or radiculopathy, lumbar region: Secondary | ICD-10-CM | POA: Diagnosis not present

## 2018-08-13 DIAGNOSIS — M169 Osteoarthritis of hip, unspecified: Secondary | ICD-10-CM | POA: Diagnosis not present

## 2018-08-13 DIAGNOSIS — Z79891 Long term (current) use of opiate analgesic: Secondary | ICD-10-CM | POA: Diagnosis not present

## 2018-08-13 DIAGNOSIS — Z79899 Other long term (current) drug therapy: Secondary | ICD-10-CM | POA: Diagnosis not present

## 2018-08-13 DIAGNOSIS — G894 Chronic pain syndrome: Secondary | ICD-10-CM | POA: Diagnosis not present

## 2018-08-13 DIAGNOSIS — M797 Fibromyalgia: Secondary | ICD-10-CM | POA: Diagnosis not present

## 2018-08-18 ENCOUNTER — Telehealth: Payer: Self-pay

## 2018-08-18 NOTE — Telephone Encounter (Signed)

## 2018-08-20 ENCOUNTER — Ambulatory Visit: Payer: Medicare Other | Admitting: Psychology

## 2018-08-21 ENCOUNTER — Ambulatory Visit (INDEPENDENT_AMBULATORY_CARE_PROVIDER_SITE_OTHER): Payer: Medicare Other | Admitting: Podiatry

## 2018-08-21 ENCOUNTER — Ambulatory Visit: Payer: Medicare Other

## 2018-08-21 ENCOUNTER — Other Ambulatory Visit: Payer: Self-pay

## 2018-08-21 ENCOUNTER — Encounter: Payer: Self-pay | Admitting: Podiatry

## 2018-08-21 VITALS — Temp 96.9°F

## 2018-08-21 DIAGNOSIS — M79671 Pain in right foot: Secondary | ICD-10-CM

## 2018-08-21 DIAGNOSIS — M79672 Pain in left foot: Secondary | ICD-10-CM

## 2018-08-22 ENCOUNTER — Ambulatory Visit (INDEPENDENT_AMBULATORY_CARE_PROVIDER_SITE_OTHER): Payer: Medicare Other | Admitting: Pharmacist

## 2018-08-22 ENCOUNTER — Other Ambulatory Visit: Payer: Self-pay

## 2018-08-22 DIAGNOSIS — Z5181 Encounter for therapeutic drug level monitoring: Secondary | ICD-10-CM | POA: Diagnosis not present

## 2018-08-22 DIAGNOSIS — I480 Paroxysmal atrial fibrillation: Secondary | ICD-10-CM

## 2018-08-22 LAB — POCT INR: INR: 2.4 (ref 2.0–3.0)

## 2018-08-22 NOTE — Patient Instructions (Signed)
Continue on same dosage 2.5mg  daily except 5mg s on Fridays.  Recheck INR in 4 weeks. Continue to eat one salad a week. Call with any medication changes or procedures: [facility] [phone] Main [phone]

## 2018-08-24 NOTE — Progress Notes (Signed)
Subjective:   Patient ID: Meredith [name], female   DOB: 68 y.o.   MRN: [id number]   HPI Patient is referred by family physician with a very short right leg and history of back pain as to whether or not we can help her.  Patient does not smoke likes to be active   Review of Systems  All other systems reviewed and are negative.       Objective:  Physical Exam Vitals signs and nursing note reviewed.  Constitutional:      Appearance: She is well-developed.  Pulmonary:     Effort: Pulmonary effort is normal.  Musculoskeletal: Normal range of motion.  Skin:    General: Skin is warm.  Neurological:     Mental Status: She is alert.     Neurovascular status was found to be intact muscle strength was reduced but active with patient found to have a significant limb length discrepancy with the right leg being approximately 1/2 inch shorter than the left.  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

## 2018-08-25 ENCOUNTER — Telehealth: Payer: Self-pay | Admitting: Psychiatry

## 2018-08-25 ENCOUNTER — Other Ambulatory Visit: Payer: Self-pay

## 2018-08-25 DIAGNOSIS — M542 Cervicalgia: Secondary | ICD-10-CM | POA: Diagnosis not present

## 2018-08-25 DIAGNOSIS — G518 Other disorders of facial nerve: Secondary | ICD-10-CM | POA: Diagnosis not present

## 2018-08-25 DIAGNOSIS — M791 Myalgia, unspecified site: Secondary | ICD-10-CM | POA: Diagnosis not present

## 2018-08-25 DIAGNOSIS — F319 Bipolar disorder, unspecified: Secondary | ICD-10-CM

## 2018-08-25 DIAGNOSIS — G43719 Chronic migraine without aura, intractable, without status migrainosus: Secondary | ICD-10-CM | POA: Diagnosis not present

## 2018-08-25 MED ORDER — QUETIAPINE FUMARATE ER 150 MG PO TB24: 150.0000 mg | ORAL_TABLET | Freq: Every day | ORAL | 2 refills | Status: DC

## 2018-08-25 NOTE — Telephone Encounter (Signed)
Refill submitted. 

## 2018-08-25 NOTE — Telephone Encounter (Signed)
Pt needs refill on seroquel er 150mg  sent to [facility]

## 2018-08-26 ENCOUNTER — Ambulatory Visit (INDEPENDENT_AMBULATORY_CARE_PROVIDER_SITE_OTHER): Payer: Medicare Other | Admitting: Psychology

## 2018-08-26 DIAGNOSIS — F3181 Bipolar II disorder: Secondary | ICD-10-CM

## 2018-09-03 ENCOUNTER — Ambulatory Visit (INDEPENDENT_AMBULATORY_CARE_PROVIDER_SITE_OTHER): Payer: Medicare Other | Admitting: Psychology

## 2018-09-03 DIAGNOSIS — F3181 Bipolar II disorder: Secondary | ICD-10-CM

## 2018-09-15 ENCOUNTER — Encounter: Payer: Self-pay | Admitting: Psychiatry

## 2018-09-15 ENCOUNTER — Other Ambulatory Visit: Payer: Self-pay

## 2018-09-15 ENCOUNTER — Ambulatory Visit (INDEPENDENT_AMBULATORY_CARE_PROVIDER_SITE_OTHER): Payer: Medicare Other | Admitting: Psychiatry

## 2018-09-15 DIAGNOSIS — F3131 Bipolar disorder, current episode depressed, mild: Secondary | ICD-10-CM

## 2018-09-15 DIAGNOSIS — F411 Generalized anxiety disorder: Secondary | ICD-10-CM | POA: Diagnosis not present

## 2018-09-15 DIAGNOSIS — F4310 Post-traumatic stress disorder, unspecified: Secondary | ICD-10-CM | POA: Diagnosis not present

## 2018-09-15 DIAGNOSIS — F4001 Agoraphobia with panic disorder: Secondary | ICD-10-CM | POA: Diagnosis not present

## 2018-09-15 DIAGNOSIS — F5105 Insomnia due to other mental disorder: Secondary | ICD-10-CM | POA: Diagnosis not present

## 2018-09-15 MED ORDER — LORAZEPAM 1 MG PO TABS: ORAL_TABLET | ORAL | 0 refills | Status: DC

## 2018-09-15 MED ORDER — LAMOTRIGINE 100 MG PO TABS: 100.0000 mg | ORAL_TABLET | Freq: Two times a day (BID) | ORAL | 0 refills | Status: DC

## 2018-09-15 MED ORDER — LITHIUM CARBONATE 300 MG PO CAPS: ORAL_CAPSULE | ORAL | 1 refills | Status: DC
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
## 2018-09-17 ENCOUNTER — Ambulatory Visit (INDEPENDENT_AMBULATORY_CARE_PROVIDER_SITE_OTHER): Payer: Medicare Other | Admitting: Psychology

## 2018-09-17 ENCOUNTER — Telehealth: Payer: Self-pay

## 2018-09-17 DIAGNOSIS — F3181 Bipolar II disorder: Secondary | ICD-10-CM

## 2018-09-17 NOTE — Telephone Encounter (Signed)
lmom for screen

## 2018-09-18 DIAGNOSIS — M545 Low back pain: Secondary | ICD-10-CM | POA: Diagnosis not present

## 2018-09-18 DIAGNOSIS — M47816 Spondylosis without myelopathy or radiculopathy, lumbar region: Secondary | ICD-10-CM | POA: Diagnosis not present

## 2018-09-18 DIAGNOSIS — M961 Postlaminectomy syndrome, not elsewhere classified: Secondary | ICD-10-CM | POA: Diagnosis not present

## 2018-09-18 DIAGNOSIS — G894 Chronic pain syndrome: Secondary | ICD-10-CM | POA: Diagnosis not present

## 2018-09-19 ENCOUNTER — Other Ambulatory Visit: Payer: Self-pay

## 2018-09-19 ENCOUNTER — Ambulatory Visit (INDEPENDENT_AMBULATORY_CARE_PROVIDER_SITE_OTHER): Payer: Medicare Other | Admitting: Pharmacist

## 2018-09-19 DIAGNOSIS — Z5181 Encounter for therapeutic drug level monitoring: Secondary | ICD-10-CM | POA: Diagnosis not present

## 2018-09-19 DIAGNOSIS — I480 Paroxysmal atrial fibrillation: Secondary | ICD-10-CM | POA: Diagnosis not present

## 2018-09-19 LAB — POCT INR: INR: 2.2 (ref 2.0–3.0)

## 2018-09-19 NOTE — Patient Instructions (Signed)
Description   Take 1.25mg  on Sunday and Monday prior to your dental implants on Tuesday. Otherwise, continue on same dosage 2.5mg  daily except 5mg s on Fridays.  Recheck INR in 6 weeks. Continue to eat one salad a week. Call with any medication changes or procedures: [facility]in [facility] [phone] Main [phone]

## 2018-09-22 DIAGNOSIS — G518 Other disorders of facial nerve: Secondary | ICD-10-CM | POA: Diagnosis not present

## 2018-09-22 DIAGNOSIS — M791 Myalgia, unspecified site: Secondary | ICD-10-CM | POA: Diagnosis not present

## 2018-09-22 DIAGNOSIS — G43719 Chronic migraine without aura, intractable, without status migrainosus: Secondary | ICD-10-CM | POA: Diagnosis not present

## 2018-09-22 DIAGNOSIS — M542 Cervicalgia: Secondary | ICD-10-CM | POA: Diagnosis not present

## 2018-09-29 ENCOUNTER — Other Ambulatory Visit: Payer: Self-pay | Admitting: Cardiovascular Disease

## 2018-09-29 ENCOUNTER — Other Ambulatory Visit: Payer: Self-pay | Admitting: Psychiatry

## 2018-09-29 DIAGNOSIS — F411 Generalized anxiety disorder: Secondary | ICD-10-CM

## 2018-09-29 DIAGNOSIS — F4310 Post-traumatic stress disorder, unspecified: Secondary | ICD-10-CM

## 2018-10-01 ENCOUNTER — Ambulatory Visit (INDEPENDENT_AMBULATORY_CARE_PROVIDER_SITE_OTHER): Payer: Medicare Other | Admitting: Psychology

## 2018-10-01 DIAGNOSIS — F3181 Bipolar II disorder: Secondary | ICD-10-CM

## 2018-10-15 ENCOUNTER — Ambulatory Visit (INDEPENDENT_AMBULATORY_CARE_PROVIDER_SITE_OTHER): Payer: Medicare Other | Admitting: Psychology

## 2018-10-15 DIAGNOSIS — M961 Postlaminectomy syndrome, not elsewhere classified: Secondary | ICD-10-CM | POA: Diagnosis not present

## 2018-10-15 DIAGNOSIS — G894 Chronic pain syndrome: Secondary | ICD-10-CM | POA: Diagnosis not present

## 2018-10-15 DIAGNOSIS — F3181 Bipolar II disorder: Secondary | ICD-10-CM

## 2018-10-15 DIAGNOSIS — M47816 Spondylosis without myelopathy or radiculopathy, lumbar region: Secondary | ICD-10-CM | POA: Diagnosis not present

## 2018-10-15 DIAGNOSIS — M545 Low back pain: Secondary | ICD-10-CM | POA: Diagnosis not present

## 2018-10-17 DIAGNOSIS — R2681 Unsteadiness on feet: Secondary | ICD-10-CM | POA: Diagnosis not present

## 2018-10-17 DIAGNOSIS — R531 Weakness: Secondary | ICD-10-CM | POA: Diagnosis not present

## 2018-10-17 DIAGNOSIS — D6869 Other thrombophilia: Secondary | ICD-10-CM | POA: Diagnosis not present

## 2018-10-17 DIAGNOSIS — R296 Repeated falls: Secondary | ICD-10-CM | POA: Diagnosis not present

## 2018-10-17 DIAGNOSIS — E039 Hypothyroidism, unspecified: Secondary | ICD-10-CM | POA: Diagnosis not present

## 2018-10-17 DIAGNOSIS — R4701 Aphasia: Secondary | ICD-10-CM | POA: Diagnosis not present

## 2018-10-17 DIAGNOSIS — D649 Anemia, unspecified: Secondary | ICD-10-CM | POA: Diagnosis not present

## 2018-10-17 DIAGNOSIS — F3181 Bipolar II disorder: Secondary | ICD-10-CM | POA: Diagnosis not present

## 2018-10-17 DIAGNOSIS — Z7901 Long term (current) use of anticoagulants: Secondary | ICD-10-CM | POA: Diagnosis not present

## 2018-10-17 DIAGNOSIS — N183 Chronic kidney disease, stage 3 (moderate): Secondary | ICD-10-CM | POA: Diagnosis not present

## 2018-10-17 DIAGNOSIS — R251 Tremor, unspecified: Secondary | ICD-10-CM | POA: Diagnosis not present

## 2018-10-19 ENCOUNTER — Other Ambulatory Visit: Payer: Self-pay

## 2018-10-19 ENCOUNTER — Emergency Department (HOSPITAL_COMMUNITY)
Admission: EM | Admit: 2018-10-19 | Discharge: 2018-10-20 | Disposition: A | Discharge status: Home / Self Care | Payer: Medicare Other | Source: Home / Self Care | Attending: Emergency Medicine | Admitting: Emergency Medicine

## 2018-10-19 ENCOUNTER — Encounter (HOSPITAL_COMMUNITY): Payer: Self-pay | Admitting: Emergency Medicine

## 2018-10-19 DIAGNOSIS — A419 Sepsis, unspecified organism: Secondary | ICD-10-CM | POA: Diagnosis not present

## 2018-10-19 DIAGNOSIS — Z7984 Long term (current) use of oral hypoglycemic drugs: Secondary | ICD-10-CM | POA: Insufficient documentation

## 2018-10-19 DIAGNOSIS — I639 Cerebral infarction, unspecified: Secondary | ICD-10-CM | POA: Diagnosis not present

## 2018-10-19 DIAGNOSIS — R410 Disorientation, unspecified: Secondary | ICD-10-CM | POA: Diagnosis not present

## 2018-10-19 DIAGNOSIS — R296 Repeated falls: Secondary | ICD-10-CM

## 2018-10-19 DIAGNOSIS — G92 Toxic encephalopathy: Secondary | ICD-10-CM | POA: Diagnosis not present

## 2018-10-19 DIAGNOSIS — R531 Weakness: Secondary | ICD-10-CM | POA: Insufficient documentation

## 2018-10-19 DIAGNOSIS — R944 Abnormal results of kidney function studies: Secondary | ICD-10-CM | POA: Insufficient documentation

## 2018-10-19 DIAGNOSIS — E039 Hypothyroidism, unspecified: Secondary | ICD-10-CM | POA: Insufficient documentation

## 2018-10-19 DIAGNOSIS — F3163 Bipolar disorder, current episode mixed, severe, without psychotic features: Secondary | ICD-10-CM | POA: Diagnosis not present

## 2018-10-19 DIAGNOSIS — Z20828 Contact with and (suspected) exposure to other viral communicable diseases: Secondary | ICD-10-CM | POA: Diagnosis not present

## 2018-10-19 DIAGNOSIS — R270 Ataxia, unspecified: Secondary | ICD-10-CM | POA: Diagnosis not present

## 2018-10-19 DIAGNOSIS — Z7901 Long term (current) use of anticoagulants: Secondary | ICD-10-CM | POA: Insufficient documentation

## 2018-10-19 DIAGNOSIS — Z7982 Long term (current) use of aspirin: Secondary | ICD-10-CM | POA: Insufficient documentation

## 2018-10-19 DIAGNOSIS — R4182 Altered mental status, unspecified: Secondary | ICD-10-CM | POA: Diagnosis not present

## 2018-10-19 DIAGNOSIS — F3181 Bipolar II disorder: Secondary | ICD-10-CM | POA: Diagnosis not present

## 2018-10-19 DIAGNOSIS — R4701 Aphasia: Secondary | ICD-10-CM | POA: Diagnosis not present

## 2018-10-19 DIAGNOSIS — N179 Acute kidney failure, unspecified: Secondary | ICD-10-CM | POA: Diagnosis not present

## 2018-10-19 LAB — CBC WITH DIFFERENTIAL/PLATELET
Abs Immature Granulocytes: 0.03 10*3/uL (ref 0.00–0.07)
Basophils Absolute: 0 10*3/uL (ref 0.0–0.1)
Basophils Relative: 0 %
Eosinophils Absolute: 0.4 10*3/uL (ref 0.0–0.5)
Eosinophils Relative: 4 %
HCT: 36.9 % (ref 36.0–46.0)
Hemoglobin: 12.2 g/dL (ref 12.0–15.0)
Immature Granulocytes: 0 %
Lymphocytes Relative: 15 %
Lymphs Abs: 1.5 10*3/uL (ref 0.7–4.0)
MCH: 29.9 pg (ref 26.0–34.0)
MCHC: 33.1 g/dL (ref 30.0–36.0)
MCV: 90.4 fL (ref 80.0–100.0)
Monocytes Absolute: 0.8 10*3/uL (ref 0.1–1.0)
Monocytes Relative: 8 %
Neutro Abs: 7.1 10*3/uL (ref 1.7–7.7)
Neutrophils Relative %: 73 %
Platelets: 297 10*3/uL (ref 150–400)
RBC: 4.08 MIL/uL (ref 3.87–5.11)
RDW: 14.3 % (ref 11.5–15.5)
WBC: 9.8 10*3/uL (ref 4.0–10.5)
nRBC: 0 % (ref 0.0–0.2)

## 2018-10-19 LAB — COMPREHENSIVE METABOLIC PANEL
ALT: 14 U/L (ref 0–44)
AST: 20 U/L (ref 15–41)
Albumin: 4.3 g/dL (ref 3.5–5.0)
Alkaline Phosphatase: 73 U/L (ref 38–126)
Anion gap: 10 (ref 5–15)
BUN: 18 mg/dL (ref 8–23)
CO2: 23 mmol/L (ref 22–32)
Calcium: 9.7 mg/dL (ref 8.9–10.3)
Chloride: 103 mmol/L (ref 98–111)
Creatinine, Ser: 1.32 mg/dL — ABNORMAL HIGH (ref 0.44–1.00)
GFR calc Af Amer: 48 mL/min — ABNORMAL LOW (ref >60)
GFR calc non Af Amer: 41 mL/min — ABNORMAL LOW (ref >60)
Glucose, Bld: 105 mg/dL — ABNORMAL HIGH (ref 70–99)
Potassium: 3.9 mmol/L (ref 3.5–5.1)
Sodium: 136 mmol/L (ref 135–145)
Total Bilirubin: 0.4 mg/dL (ref 0.3–1.2)
Total Protein: 7.2 g/dL (ref 6.5–8.1)

## 2018-10-19 NOTE — ED Triage Notes (Signed)
Patient reports abnormal blood tests results from her PCP visit this Friday ( elevated GFR, etc. ) , pt. added increasing unsteady gait / fall this week . Denies pain/respirations unlabored .

## 2018-10-20 ENCOUNTER — Emergency Department (HOSPITAL_COMMUNITY): Payer: Medicare Other

## 2018-10-20 DIAGNOSIS — R270 Ataxia, unspecified: Secondary | ICD-10-CM | POA: Diagnosis not present

## 2018-10-20 LAB — URINALYSIS, ROUTINE W REFLEX MICROSCOPIC
Bacteria, UA: NONE SEEN
Bilirubin Urine: NEGATIVE
Glucose, UA: NEGATIVE mg/dL
Ketones, ur: NEGATIVE mg/dL
Leukocytes,Ua: NEGATIVE
Nitrite: NEGATIVE
Protein, ur: NEGATIVE mg/dL
Specific Gravity, Urine: 1.008 (ref 1.005–1.030)
pH: 6 (ref 5.0–8.0)

## 2018-10-20 LAB — CK: Total CK: 156 U/L (ref 38–234)

## 2018-10-20 MED ORDER — SODIUM CHLORIDE 0.9 % IV BOLUS
500.0000 mL | Freq: Once | INTRAVENOUS | Status: AC
  Administered 2018-10-20: 500 mL via INTRAVENOUS

## 2018-10-20 MED ORDER — ACETAMINOPHEN 325 MG PO TABS
650.0000 mg | ORAL_TABLET | Freq: Once | ORAL | Status: AC
  Administered 2018-10-20: 650 mg via ORAL
  Filled 2018-10-20: qty 2

## 2018-10-20 MED ORDER — ASPIRIN EC 325 MG PO TBEC
325.0000 mg | DELAYED_RELEASE_TABLET | Freq: Once | ORAL | Status: AC
  Administered 2018-10-20: 325 mg via ORAL
  Filled 2018-10-20: qty 1

## 2018-10-20 NOTE — ED Provider Notes (Signed)
Care assumed from [facility], [place], at shift change, please see their notes for full documentation of patient's complaint/HPI. Briefly, pt here with abnormal bloodwork done at PCP's office last week (elevated AKI with creatinine 3) after increase in number of falls. Results so far here show creatinine 1.32; pt already received fluids. Awaiting PT eval prior to discharge. Plan is to discharge home.   Physical Exam  BP 140/73   Pulse 87   Temp 98.5 F (36.9 C)   Resp 19   Ht 5' (1.524 m)   Wt 78.5 kg   LMP  (LMP Unknown)   SpO2 100%   BMI 33.79 kg/m   Physical Exam  ED Course/Procedures     Procedures  MDM  Social worker informed that PT recommends 24 hour care. She is waiting to hear back to see if patient needs a minimum of a 3 night stay prior to this occurring given her insurance or if this is being waved currently.   Social worker discussed case with family who opts for home health.  Will put in orders for PT and home health aide.  Patient to be discharged home at this time.  She is to follow-up with her PCP regarding her recurrent falls as well.        Eustaquio Maize, PA-C 10/20/18 [place], DO 10/20/18 1229

## 2018-10-20 NOTE — ED Notes (Signed)
Patient transported to MRI 

## 2018-10-20 NOTE — ED Notes (Signed)
Pt's jewelry removed for MRI, put jewelry in container and placed in pt's purse.

## 2018-10-20 NOTE — TOC Transition Note (Signed)
Transition of Care [facility]) - CM/SW Discharge Note   Patient Details  Name: Meredith [name] MRN: [id number] Date of Birth: 1950/05/13  Transition of Care [facility] CM/SW Contact:  Fuller Mandril, RN Phone Number: 10/20/2018, 11:30 AM   Clinical Narrative:    [facility] spoke with pt at bedside and brother via telephone regarding disposition plan.  EDCM explained that pt would need 3 day hospital stay to qualify for SNF placement from ED.  Pt and brother agreed to home health services.    Final next level of care: Home w Home Health Services Barriers to Discharge: Barriers Resolved   Patient Goals and CMS Choice Patient states their goals for this hospitalization and ongoing recovery are:: stop falling   Choice offered to / list presented to : Patient  Discharge Placement                       Discharge Plan and Services   Discharge Planning Services: CM Consult                      HH Arranged: RN, PT, OT, Nurse's Aide, Social Work [facility] Agency: [place] Date [facility] Agency Contacted: 10/20/18 Time [place]: 1120 Representative spoke with at [place]: [place] ([place]) Interventions     Readmission Risk Interventions No flowsheet data found.

## 2018-10-20 NOTE — TOC Transition Note (Signed)
Transition of Care [facility]) - CM/SW Discharge Note   Patient Details  Name: Meredith [name] MRN: [id number] Date of Birth: [birth year]  Transition of Care [facility]) CM/SW Contact:  Fuller Mandril, RN Phone Number: 10/20/2018, 11:39 AM   Clinical Narrative:     Jason Coop. Clydene Laming, RN, BSN, [employer] [phone] Spoke with pt at bedside regarding discharge planning for [facility]. Offered pt list of home health agencies to choose from.  Pt chose [facility] to render services. Burr Medico of [facility] notified. Patient made aware that [facility] will be in contact in 24-48 hours.  No DME needs identified at this time.   Final next level of care: Home w Home Health Services Barriers to Discharge: Barriers Resolved   Patient Goals and CMS Choice Patient states their goals for this hospitalization and ongoing recovery are:: stop falling   Choice offered to / list presented to : Patient  Discharge Placement                       Discharge Plan and Services   Discharge Planning Services: CM Consult                      HH Arranged: RN, PT, OT, Nurse's Aide, Social Work [facility] Agency: [place] Date [facility] Agency Contacted: 10/20/18 Time [place]: 1120 Representative spoke with at [place]: [place] ([place]) Interventions     Readmission Risk Interventions No flowsheet data found.

## 2018-10-20 NOTE — ED Notes (Signed)
ED Provider at bedside. 

## 2018-10-20 NOTE — TOC Initial Note (Signed)
Transition of Care [facility]) - Initial/Assessment Note    Patient Details  Name: Meredith [name] MRN: [id number] Date of Birth: [birth year]  Transition of Care [facility]) CM/SW Contact:    Fuller Mandril, RN Phone Number: 10/20/2018, 11:29 AM  Clinical Narrative:                 [facility] consulted regarding home health vs SNF placement disposition plan.  Expected Discharge Plan: [place] Barriers to Discharge: Barriers Resolved   Patient Goals and CMS Choice Patient states their goals for this hospitalization and ongoing recovery are:: stop falling   Choice offered to / list presented to : Patient  Expected Discharge Plan and Services Expected Discharge Plan: [place]   Discharge Planning Services: CM Consult   Living arrangements for the past 2 months: Apartment Expected Discharge Date: 10/20/18                         HH Arranged: RN, PT, OT, Nurse's Aide, Social Work [employer] Agency: [place] Date [facility] Agency Contacted: 10/20/18 Time HH Agency Contacted: 1120 Representative spoke with at [place]: [place]  Prior Living Arrangements/Services Living arrangements for the past 2 months: [place] with:: Self Patient language and need for interpreter reviewed:: Yes        Need for Family Participation in Patient Care: Yes (Comment) Care giver support system in place?: Yes (comment)(brother checks in on her)   Criminal Activity/Legal Involvement Pertinent to Current Situation/Hospitalization: No - Comment as needed  Activities of Daily Living      Permission Sought/Granted Permission sought to share information with : Case Freight forwarder, Customer service manager, Family Supports Permission granted to share information with : Yes, Verbal Permission Granted     Permission granted to share info w AGENCY: [facility]  Permission granted to share info w Relationship: Brother     Emotional Assessment Appearance:: Appears stated  age Attitude/Demeanor/Rapport: Engaged Affect (typically observed): Appropriate Orientation: : Oriented to Self, Oriented to Place, Oriented to  Time, Oriented to Situation Alcohol / Substance Use: Not Applicable Psych Involvement: No (comment)  Admission diagnosis:  Eleveated GFR Patient Active Problem List   Diagnosis Date Noted  . Hypothyroidism 02/24/2018  . Encounter for therapeutic drug monitoring 01/03/2018  . PTSD (post-traumatic stress disorder) 11/29/2017  . Falls 11/25/2017  . Disorientation   . Delirium 11/24/2017  . Stiffness of finger joint of right hand 06/18/2017  . Osteoarthritis of carpometacarpal (CMC) joint of thumb 05/21/2017  . Anxiety 11/29/2016  . Chronic pain syndrome 07/30/2016  . Bipolar disorder, curr episode mixed, severe, with psychotic features ([place]) 07/28/2016  . PAF (paroxysmal atrial fibrillation) ([place]) 11/14/2015  . Intractable chronic migraine without aura 04/24/2012   PCP:  Kristen Loader, FNP Pharmacy:   [facility], [place] [address] Phone: [phone] Fax: [place] [address] [place] [address] [place] [address] Phone: [phone] Fax: [place] [address] [place] [address] Phone: [phone] Fax: [phone]  [id number] [place], [place] [address] Phone: [phone] Fax: [phone]     Social Determinants of Health (SDOH) Interventions    Readmission Risk Interventions No flowsheet data found.

## 2018-10-20 NOTE — Evaluation (Signed)
Physical Therapy Evaluation Patient Details Name: Meredith [name] MRN: [id number] DOB: 05-30-1950 Today's Date: 10/20/2018   History of Present Illness  Pt is a 68 y/o female presenting to the ED secondary to weakness and multiple falls. PMH includes a fib, bipolar and osteoporosis.   Clinical Impression  Pt presenting with problem above with deficits below. Pt very shaky during gait requiring min A for steadying assist throughout. Pt reports multiple falls at home and reports she lives alone. Feel she would benefit from SNF level therapies, however, pt likely not eligible. Will require 24/7 assist and max HH services if d/c'd home. Will continue to follow acutely to maximize functional mobility independence and safety.     Follow Up Recommendations SNF;Supervision/Assistance - 24 hour(if not eligible for SNF will require max HH services)    Equipment Recommendations  None recommended by PT(reports WC will not fit in her apartment)    Recommendations for Other Services       Precautions / Restrictions Precautions Precautions: Fall Precaution Comments: Pt reports multiple falls at home.  Restrictions Weight Bearing Restrictions: No      Mobility  Bed Mobility Overal bed mobility: Needs Assistance Bed Mobility: Supine to Sit;Sit to Supine     Supine to sit: Min assist Sit to supine: Min guard   General bed mobility comments: Min A to come to sitting for trunk elevation. Increased shakiness noted in sitting. Min guard to return to supine.   Transfers Overall transfer level: Needs assistance Equipment used: Rolling walker (2 wheeled) Transfers: Sit to/from Stand Sit to Stand: Min assist         General transfer comment: Min A for lift assist and steadying. Increased shakiness noted in standing.   Ambulation/Gait Ambulation/Gait assistance: Min assist Gait Distance (Feet): 40 Feet Assistive device: Rolling walker (2 wheeled) Gait Pattern/deviations: Step-through  pattern;Decreased stride length Gait velocity: Decreased   General Gait Details: Slow, unsteady gait. Min A for steadying secondary to shakiness. Cues for proximity to device.   Stairs            Wheelchair Mobility    Modified Rankin (Stroke Patients Only)       Balance Overall balance assessment: Needs assistance Sitting-balance support: No upper extremity supported;Feet supported Sitting balance-Leahy Scale: Fair     Standing balance support: Bilateral upper extremity supported;During functional activity Standing balance-Leahy Scale: Poor Standing balance comment: Reliant on BUE support                              Pertinent Vitals/Pain Pain Assessment: No/denies pain    Home Living Family/patient expects to be discharged to:: Private residence Living Arrangements: Alone Available Help at Discharge: Family;Available PRN/intermittently Type of Home: Apartment Home Access: Level entry     Home Layout: One level Home Equipment: Clinical cytogeneticist - 2 wheels      Prior Function Level of Independence: Independent with assistive device(s)         Comments: Had been using RW, however, reports multiple falls.      Hand Dominance        Extremity/Trunk Assessment   Upper Extremity Assessment Upper Extremity Assessment: Generalized weakness(tremor noted bilaterally)    Lower Extremity Assessment Lower Extremity Assessment: Generalized weakness(tremor noted bilaterally)    Cervical / Trunk Assessment Cervical / Trunk Assessment: Normal  Communication   Communication: No difficulties  Cognition Arousal/Alertness: Awake/alert Behavior During Therapy: WFL for tasks assessed/performed Overall Cognitive Status: No  family/caregiver present to determine baseline cognitive functioning                                 General Comments: Somewhat slow to process.       General Comments General comments (skin integrity, edema,  etc.): Educated pt about need for 24/7 assist, however, pt reports brother will likely not be able to provide.     Exercises     Assessment/Plan    PT Assessment Patient needs continued PT services  PT Problem List Decreased strength;Decreased balance;Decreased mobility;Decreased knowledge of use of DME;Decreased knowledge of precautions       PT Treatment Interventions Gait training;DME instruction;Stair training;Functional mobility training;Therapeutic activities;Therapeutic exercise;Balance training;Patient/family education    PT Goals (Current goals can be found in the Care Plan section)  Acute Rehab PT Goals Patient Stated Goal: to stop falling PT Goal Formulation: With patient Time For Goal Achievement: 11/03/18 Potential to Achieve Goals: Good    Frequency Min 3X/week   Barriers to discharge Decreased caregiver support      Co-evaluation               AM-PAC PT "6 Clicks" Mobility  Outcome Measure Help needed turning from your back to your side while in a flat bed without using bedrails?: A Little Help needed moving from lying on your back to sitting on the side of a flat bed without using bedrails?: A Little Help needed moving to and from a bed to a chair (including a wheelchair)?: A Little Help needed standing up from a chair using your arms (e.g., wheelchair or bedside chair)?: A Little Help needed to walk in hospital room?: A Little Help needed climbing 3-5 steps with a railing? : A Lot 6 Click Score: 17    End of Session   Activity Tolerance: Patient tolerated treatment well Patient left: in bed;with call bell/phone within reach Nurse Communication: Mobility status PT Visit Diagnosis: History of falling (Z91.81);Muscle weakness (generalized) (M62.81);Repeated falls (R29.6);Unsteadiness on feet (R26.81)    Time: 1042-1100 PT Time Calculation (min) (ACUTE ONLY): 18 min   Charges:   PT Evaluation $PT Eval Moderate Complexity: 1 Mod           Leighton Ruff, PT, DPT  Acute Rehabilitation Services  Pager: [phone] Office: [phone]   Rudean Hitt 10/20/2018, 1:55 PM

## 2018-10-20 NOTE — ED Notes (Signed)
Called her brother he is on the way

## 2018-10-20 NOTE — ED Notes (Signed)
Meredith [name](Lunch Tray Ordered) called @ 1106 called by Angela  

## 2018-10-20 NOTE — ED Provider Notes (Signed)
[place] EMERGENCY DEPARTMENT Provider Note   CSN: [id number] Arrival date & time: 10/19/18  2011     History   Chief Complaint Chief Complaint  Patient presents with  . Abnormal Blood Test    HPI Meredith [name] is a 68 y.o. female.     Patient with a to ED with complaint of progressively worsening generalized weakness over the last 2 weeks with increased number of falls. She fell 4 nights ago (10/16/18) and could not get herself up from the floor until her brother came to her home the following morning. She was seen by her doctor that afternoon and blood tests were drawn before she went home. She was contacted by her 71 office recommending she come to the ED for further evaluation based on abnormal lab tests. The patient continues to feel generally weak. She denies change in appetite, vomiting, diarrhea, cough, fever, urinary symptoms, chest pain, SOB/DOE. She lives alone.   The history is provided by the patient. No language interpreter was used.    Past Medical History:  Diagnosis Date  . Anxiety   . Atrial fibrillation ([place])   . Bipolar 2 disorder ([place])   . Depression   . History of cardioversion    x2  . Hyperlipidemia   . Hypothyroidism   . Migraine headache   . Osteoporosis     Patient Active Problem List   Diagnosis Date Noted  . Hypothyroidism 02/24/2018  . Encounter for therapeutic drug monitoring 01/03/2018  . PTSD (post-traumatic stress disorder) 11/29/2017  . Falls 11/25/2017  . Disorientation   . Delirium 11/24/2017  . Stiffness of finger joint of right hand 06/18/2017  . Osteoarthritis of carpometacarpal (CMC) joint of thumb 05/21/2017  . Anxiety 11/29/2016  . Chronic pain syndrome 07/30/2016  . Bipolar disorder, curr episode mixed, severe, with psychotic features ([place]) 07/28/2016  . PAF (paroxysmal atrial fibrillation) ([place]) 11/14/2015  . Intractable chronic migraine without aura 04/24/2012    Past Surgical  History:  Procedure Laterality Date  . ABLATION    . BACK SURGERY  2014  . CARDIOVERSION  12/2013, 01/2014   x2  . KNEE SURGERY     ORTHOSCOPIC  . LUNG REMOVAL, PARTIAL Right    BENIGN LUNG CYST  . THORACIC SPINE SURGERY Right   . THUMB ARTHROSCOPY       OB History   No obstetric history on file.      Home Medications    Prior to Admission medications   Medication Sig Start Date End Date Taking? Authorizing Provider  aspirin 325 MG tablet Take 325 mg by mouth daily.    [provider]  baclofen (LIORESAL) 10 MG tablet Take 10 mg by mouth 2 (two) times daily as needed. 04/16/18   [provider]  busPIRone (BUSPAR) 15 MG tablet TAKE 1 TABLET (15 MG TOTAL) BY MOUTH 2 (TWO) TIMES DAILY. 09/29/18   Cottle, Billey Co., MD  calcium carbonate (OS-CAL - DOSED IN MG OF ELEMENTAL CALCIUM) 1250 (500 Ca) MG tablet Take 1 tablet (500 mg of elemental calcium total) by mouth daily. For bone health 08/01/16   Lindell Spar I, NP  Cholecalciferol (VITAMIN D3) 1000 units CAPS Take 5,000 Units by mouth daily.     [provider]  clotrimazole (LOTRIMIN) 1 % cream APPLY TO AFFECTED AREA TWICE A DAY 04/01/18   [provider]  dextromethorphan-guaiFENesin (MUCINEX DM) 30-600 MG 12hr tablet Take 1 tablet by mouth 2 (two) times daily.  [provider]  diphenhydrAMINE (BENADRYL ALLERGY) 25 mg capsule Take 50 mg by mouth 4 (four) times daily.     [provider]  Erenumab-aooe (AIMOVIG) 70 MG/ML SOAJ Inject 70 mg into the skin every 30 (thirty) days.    [provider]  estradiol (ESTRACE) 1 MG tablet Take 1.5 tablets (1.5 mg total) by mouth daily. For hormone replacement Patient taking differently: Take 1 mg by mouth daily. For hormone replacement 02/24/18   [place], Betty G, MD  ferrous sulfate 325 (65 FE) MG tablet Take 1 tablet (325 mg total) by mouth daily with breakfast. Ferrous sulfate 07/31/16   Lindell Spar I, NP  HYDROcodone-acetaminophen  (NORCO) 7.5-325 MG tablet Take 1 tablet by mouth 2 (two) times daily as needed. Patient taking differently: Take 1 tablet by mouth 3 (three) times daily as needed.  11/26/17   Nita Sells, MD  lamoTRIgine (LAMICTAL) 100 MG tablet Take 1 tablet (100 mg total) by mouth 2 (two) times daily. 09/15/18   Cottle, Billey Co., MD  levothyroxine (SYNTHROID, LEVOTHROID) 100 MCG tablet Take 1 tablet (100 mcg total) by mouth daily before breakfast. 05/06/18   [place], Betty G, MD  lithium carbonate 300 MG capsule 1 capsule at night for 1 week then, 2 nightly. 09/15/18   Cottle, Billey Co., MD  LORazepam (ATIVAN) 1 MG tablet 1-2 tablets before dental procedure 09/15/18   Cottle, Billey Co., MD  Melatonin 5 MG TABS Take 30 mg by mouth at bedtime.     [provider]  metFORMIN (GLUCOPHAGE) 500 MG tablet Take 2 tablets (1,000 mg total) by mouth 2 (two) times daily with a meal. 06/23/18   Cottle, Billey Co., MD  metoprolol succinate (TOPROL-XL) 50 MG 24 hr tablet Take 1 tablet by mouth daily with or immediately following a meal. 01/27/18   [name], Wonda Cheng, MD  mupirocin ointment (BACTROBAN) 2 % Apply 1 application topically 2 (two) times daily. 04/29/18   [place], Betty G, MD  ondansetron (ZOFRAN) 4 MG tablet Take 1 tablet (4 mg total) by mouth 3 (three) times daily as needed. As needed for nausea 07/31/16   Lindell Spar I, NP  pantoprazole (PROTONIX) 40 MG tablet Take 1 tablet (40 mg total) by mouth daily. 07/07/18   [place], Betty G, MD  pravastatin (PRAVACHOL) 20 MG tablet Take 20 mg by mouth daily. 10/22/17   [provider]  pregabalin (LYRICA) 150 MG capsule Take 1 capsule (150 mg total) by mouth 3 (three) times daily. For nerve pain 11/26/17   Nita Sells, MD  QUEtiapine (SEROQUEL XR) 150 MG 24 hr tablet Take 1 tablet (150 mg total) by mouth at bedtime. 08/25/18   Cottle, Billey Co., MD  senna-docusate (PERI-COLACE) 8.6-50 MG tablet Take 2 tablets by mouth 2 (two) times daily. For  constipation 07/31/16   Lindell Spar I, NP  verapamil (VERELAN PM) 120 MG 24 hr capsule Take 1 capsule (120 mg total) by mouth at bedtime. For high blood pressure 07/31/16   Lindell Spar I, NP  warfarin (COUMADIN) 2.5 MG tablet Take as directed by [facility] 09/29/18   [name], Wonda Cheng, MD  warfarin (COUMADIN) 5 MG tablet TAKE 1 TABLET BY MOUTH ON MONDAYS, WEDNESDAYS, AND FRIDAYS Patient taking differently: Take 5 mg by mouth See admin instructions. TAKE  AS DIRECTED BY COUMADIN CLINIC 03/05/18   Deboraha Sprang, MD    Family History Family History  Problem Relation Age of Onset  . Arthritis Mother   .  Depression Mother   . Diabetes Mother   . Heart disease Mother   . Stroke Mother   . Early death Father   . Heart disease Father   . Atrial fibrillation Brother   . Hyperlipidemia Brother   . Multiple sclerosis Sister   . Alcohol abuse Brother   . Asthma Brother   . Drug abuse Brother   . Hypertension Brother   . Mental illness Brother     Social History Social History   Tobacco Use  . Smoking status: Never Smoker  . Smokeless tobacco: Never Used  Substance Use Topics  . Alcohol use: No  . Drug use: No     Allergies   Codeine, Adhesive [tape], Celebrex [celecoxib], Cephalexin, Sulfa antibiotics, Tricyclic antidepressants, Amoxicillin, Ampicillin, and Penicillins   Review of Systems Review of Systems  Constitutional: Negative for appetite change, chills and fever.  HENT: Negative.   Eyes: Negative for visual disturbance.  Respiratory: Negative.  Negative for cough and shortness of breath.   Cardiovascular: Negative.  Negative for chest pain and leg swelling.  Gastrointestinal: Negative.  Negative for abdominal pain, diarrhea, nausea and vomiting.  Genitourinary: Negative.  Negative for dysuria and frequency.  Musculoskeletal: Negative.  Negative for back pain, joint swelling and myalgias.  Skin: Negative.  Negative for color change and wound.  Neurological: Positive  for weakness. Negative for syncope, light-headedness and headaches.     Physical Exam Updated Vital Signs BP 139/71   Pulse 80   Temp 98.5 F (36.9 C)   Resp 12   Ht 5' (1.524 m)   Wt 78.5 kg   LMP  (LMP Unknown)   SpO2 97%   BMI 33.79 kg/m   Physical Exam Vitals signs and nursing note reviewed.  Constitutional:      General: She is not in acute distress.    Appearance: Normal appearance. She is well-developed.  HENT:     Head: Normocephalic and atraumatic.     Mouth/Throat:     Mouth: Mucous membranes are moist.  Neck:     Musculoskeletal: Normal range of motion and neck supple.     Vascular: No carotid bruit.  Cardiovascular:     Rate and Rhythm: Normal rate and regular rhythm.     Heart sounds: No murmur.  Pulmonary:     Effort: Pulmonary effort is normal.     Breath sounds: Normal breath sounds. No wheezing, rhonchi or rales.  Abdominal:     General: Bowel sounds are normal.     Palpations: Abdomen is soft.     Tenderness: There is no abdominal tenderness. There is no guarding or rebound.  Musculoskeletal: Normal range of motion.  Skin:    General: Skin is warm and dry.     Findings: No rash.  Neurological:     Mental Status: She is alert and oriented to person, place, and time.     Sensory: No sensory deficit.     Coordination: Coordination normal.     Deep Tendon Reflexes: Reflexes normal.     Comments: No lateralizing weakness.       ED Treatments / Results  Labs (all labs ordered are listed, but only abnormal results are displayed) Labs Reviewed  COMPREHENSIVE METABOLIC PANEL - Abnormal; Notable for the following components:      Result Value   Glucose, Bld 105 (*)    Creatinine, Ser 1.32 (*)    GFR calc non Af Amer 41 (*)    GFR calc Af Amer 48 (*)  All other components within normal limits  URINALYSIS, ROUTINE W REFLEX MICROSCOPIC - Abnormal; Notable for the following components:   Color, Urine STRAW (*)    Hgb urine dipstick SMALL (*)     All other components within normal limits  CBC WITH DIFFERENTIAL/PLATELET  CK    EKG None  Radiology Mr Brain Wo Contrast  Result Date: 10/20/2018 CLINICAL DATA:  Ataxia EXAM: MRI HEAD WITHOUT CONTRAST TECHNIQUE: Multiplanar, multiecho pulse sequences of the brain and surrounding structures were obtained without intravenous contrast. COMPARISON:  None. FINDINGS: BRAIN: There is no acute infarct, acute hemorrhage or extra-axial collection. The white matter signal is normal for the patient's age. There is generalized atrophy without lobar predilection. The midline structures are normal. VASCULAR: Susceptibility-sensitive sequences show no chronic microhemorrhage or superficial siderosis. The major intracranial arterial and venous sinus flow voids are normal. SKULL AND UPPER CERVICAL SPINE: Calvarial bone marrow signal is normal. There is no skull base mass. The visualized upper cervical spine and soft tissues are normal. SINUSES/ORBITS: There are no fluid levels or advanced mucosal thickening. The mastoid air cells and middle ear cavities are free of fluid. The orbits are normal. IMPRESSION: 1. No acute intracranial abnormality. 2.  Chronic parenchymal volume loss. Electronically Signed   By: Ulyses Jarred M.D.   On: 10/20/2018 03:05    Procedures Procedures (including critical care time)  Medications Ordered in ED Medications  sodium chloride 0.9 % bolus 500 mL (500 mLs Intravenous New Bag/Given 10/20/18 0147)  acetaminophen (TYLENOL) tablet 650 mg (650 mg Oral Given 10/20/18 0204)  aspirin EC tablet 325 mg (325 mg Oral Given 10/20/18 0452)     Initial Impression / Assessment and Plan / ED Course  I have reviewed the triage vital signs and the nursing notes.  Pertinent labs & imaging results that were available during my care of the patient were reviewed by me and considered in my medical decision making (see chart for details).        Patient to ED with progressive weakness causing  increasing number of falls. Seen by her PCP 3 days ago and called with abnormal test results recommending she come to the ED for further evaluation.   The patient brought labs with her from the PCP visit which showed Cr. 3.0. Repeat study tonight show Cr. 1.32. No significant lab abnormalities. MRI ordered for evaluation of a global shakiness when sitting up associated with extremity tremors. MRI negative.   There is not a reason for medical admission tonight. She is weak when trying to stand - has a walker at home. Feel she would benefit for PT evaluation to determine safety for home discharge. Will order PT consult for am.   She complains of headache pain and is requesting aspirin. Tylenol initially provided with relief.   Anticipate discharge after PT evaluation.   Final Clinical Impressions(s) / ED Diagnoses   Final diagnoses:  None   1. Generalized weakness  ED Discharge Orders    None       Dennie Bible 10/20/18 0715    Mesner, Corene Cornea, MD 10/23/18 [phone]

## 2018-10-20 NOTE — Discharge Instructions (Addendum)
Please follow up with your doctor this week for close recheck of your weakness causing multiple falls.   Return to the emergency department with any worsening symptoms or new concerns.

## 2018-10-20 NOTE — ED Notes (Signed)
PT here to walk with pt, I spoke to Camile and she will arraange for Home health and PT to assist pt at home

## 2018-10-21 ENCOUNTER — Other Ambulatory Visit: Payer: Self-pay | Admitting: Family Medicine

## 2018-10-21 DIAGNOSIS — F0280 Dementia in other diseases classified elsewhere without behavioral disturbance: Secondary | ICD-10-CM | POA: Diagnosis not present

## 2018-10-21 DIAGNOSIS — E785 Hyperlipidemia, unspecified: Secondary | ICD-10-CM | POA: Diagnosis not present

## 2018-10-21 DIAGNOSIS — M1810 Unilateral primary osteoarthritis of first carpometacarpal joint, unspecified hand: Secondary | ICD-10-CM | POA: Diagnosis not present

## 2018-10-21 DIAGNOSIS — Z7901 Long term (current) use of anticoagulants: Secondary | ICD-10-CM | POA: Diagnosis not present

## 2018-10-21 DIAGNOSIS — F4310 Post-traumatic stress disorder, unspecified: Secondary | ICD-10-CM | POA: Diagnosis not present

## 2018-10-21 DIAGNOSIS — R4701 Aphasia: Secondary | ICD-10-CM

## 2018-10-21 DIAGNOSIS — F419 Anxiety disorder, unspecified: Secondary | ICD-10-CM | POA: Diagnosis not present

## 2018-10-21 DIAGNOSIS — I480 Paroxysmal atrial fibrillation: Secondary | ICD-10-CM | POA: Diagnosis not present

## 2018-10-21 DIAGNOSIS — W19XXXD Unspecified fall, subsequent encounter: Secondary | ICD-10-CM | POA: Diagnosis not present

## 2018-10-21 DIAGNOSIS — Z743 Need for continuous supervision: Secondary | ICD-10-CM | POA: Diagnosis not present

## 2018-10-21 DIAGNOSIS — Z9181 History of falling: Secondary | ICD-10-CM | POA: Diagnosis not present

## 2018-10-21 DIAGNOSIS — R2681 Unsteadiness on feet: Secondary | ICD-10-CM

## 2018-10-21 DIAGNOSIS — Z7982 Long term (current) use of aspirin: Secondary | ICD-10-CM | POA: Diagnosis not present

## 2018-10-21 DIAGNOSIS — R296 Repeated falls: Secondary | ICD-10-CM | POA: Diagnosis not present

## 2018-10-21 DIAGNOSIS — G43719 Chronic migraine without aura, intractable, without status migrainosus: Secondary | ICD-10-CM | POA: Diagnosis not present

## 2018-10-21 DIAGNOSIS — F3181 Bipolar II disorder: Secondary | ICD-10-CM | POA: Diagnosis not present

## 2018-10-21 DIAGNOSIS — E039 Hypothyroidism, unspecified: Secondary | ICD-10-CM | POA: Diagnosis not present

## 2018-10-21 DIAGNOSIS — G894 Chronic pain syndrome: Secondary | ICD-10-CM | POA: Diagnosis not present

## 2018-10-21 DIAGNOSIS — M810 Age-related osteoporosis without current pathological fracture: Secondary | ICD-10-CM | POA: Diagnosis not present

## 2018-10-21 DIAGNOSIS — N179 Acute kidney failure, unspecified: Secondary | ICD-10-CM | POA: Diagnosis not present

## 2018-10-21 DIAGNOSIS — Z602 Problems related to living alone: Secondary | ICD-10-CM | POA: Diagnosis not present

## 2018-10-21 DIAGNOSIS — F05 Delirium due to known physiological condition: Secondary | ICD-10-CM | POA: Diagnosis not present

## 2018-10-22 ENCOUNTER — Inpatient Hospital Stay (HOSPITAL_COMMUNITY)
Admission: EM | Admit: 2018-10-22 | Discharge: 2018-10-28 | DRG: 092 | Disposition: A | Discharge status: Skilled Nursing Facility | Payer: Medicare Other | Attending: Internal Medicine | Admitting: Internal Medicine

## 2018-10-22 ENCOUNTER — Emergency Department (HOSPITAL_COMMUNITY): Payer: Medicare Other

## 2018-10-22 ENCOUNTER — Encounter (HOSPITAL_COMMUNITY): Payer: Self-pay | Admitting: Emergency Medicine

## 2018-10-22 ENCOUNTER — Telehealth: Payer: Self-pay | Admitting: Psychiatry

## 2018-10-22 ENCOUNTER — Other Ambulatory Visit: Payer: Self-pay

## 2018-10-22 ENCOUNTER — Other Ambulatory Visit: Payer: Self-pay | Admitting: Psychiatry

## 2018-10-22 DIAGNOSIS — R4182 Altered mental status, unspecified: Secondary | ICD-10-CM | POA: Diagnosis not present

## 2018-10-22 DIAGNOSIS — E785 Hyperlipidemia, unspecified: Secondary | ICD-10-CM | POA: Diagnosis present

## 2018-10-22 DIAGNOSIS — Z882 Allergy status to sulfonamides status: Secondary | ICD-10-CM

## 2018-10-22 DIAGNOSIS — Z7982 Long term (current) use of aspirin: Secondary | ICD-10-CM

## 2018-10-22 DIAGNOSIS — Z20828 Contact with and (suspected) exposure to other viral communicable diseases: Secondary | ICD-10-CM | POA: Diagnosis not present

## 2018-10-22 DIAGNOSIS — N289 Disorder of kidney and ureter, unspecified: Secondary | ICD-10-CM | POA: Diagnosis not present

## 2018-10-22 DIAGNOSIS — F4310 Post-traumatic stress disorder, unspecified: Secondary | ICD-10-CM | POA: Diagnosis present

## 2018-10-22 DIAGNOSIS — Z8249 Family history of ischemic heart disease and other diseases of the circulatory system: Secondary | ICD-10-CM

## 2018-10-22 DIAGNOSIS — Z881 Allergy status to other antibiotic agents status: Secondary | ICD-10-CM

## 2018-10-22 DIAGNOSIS — Z91048 Other nonmedicinal substance allergy status: Secondary | ICD-10-CM

## 2018-10-22 DIAGNOSIS — G9340 Encephalopathy, unspecified: Secondary | ICD-10-CM | POA: Diagnosis present

## 2018-10-22 DIAGNOSIS — M7989 Other specified soft tissue disorders: Secondary | ICD-10-CM | POA: Diagnosis not present

## 2018-10-22 DIAGNOSIS — Z888 Allergy status to other drugs, medicaments and biological substances status: Secondary | ICD-10-CM

## 2018-10-22 DIAGNOSIS — M79661 Pain in right lower leg: Secondary | ICD-10-CM | POA: Diagnosis not present

## 2018-10-22 DIAGNOSIS — Z79891 Long term (current) use of opiate analgesic: Secondary | ICD-10-CM

## 2018-10-22 DIAGNOSIS — T43595A Adverse effect of other antipsychotics and neuroleptics, initial encounter: Secondary | ICD-10-CM | POA: Diagnosis present

## 2018-10-22 DIAGNOSIS — F3164 Bipolar disorder, current episode mixed, severe, with psychotic features: Secondary | ICD-10-CM | POA: Diagnosis not present

## 2018-10-22 DIAGNOSIS — Z79899 Other long term (current) drug therapy: Secondary | ICD-10-CM

## 2018-10-22 DIAGNOSIS — R4701 Aphasia: Secondary | ICD-10-CM | POA: Diagnosis present

## 2018-10-22 DIAGNOSIS — Z880 Allergy status to penicillin: Secondary | ICD-10-CM

## 2018-10-22 DIAGNOSIS — R1110 Vomiting, unspecified: Secondary | ICD-10-CM | POA: Diagnosis not present

## 2018-10-22 DIAGNOSIS — R531 Weakness: Secondary | ICD-10-CM | POA: Diagnosis not present

## 2018-10-22 DIAGNOSIS — G894 Chronic pain syndrome: Secondary | ICD-10-CM | POA: Diagnosis present

## 2018-10-22 DIAGNOSIS — F3163 Bipolar disorder, current episode mixed, severe, without psychotic features: Secondary | ICD-10-CM | POA: Diagnosis present

## 2018-10-22 DIAGNOSIS — E039 Hypothyroidism, unspecified: Secondary | ICD-10-CM | POA: Diagnosis not present

## 2018-10-22 DIAGNOSIS — Z79890 Hormone replacement therapy (postmenopausal): Secondary | ICD-10-CM

## 2018-10-22 DIAGNOSIS — A419 Sepsis, unspecified organism: Secondary | ICD-10-CM | POA: Diagnosis not present

## 2018-10-22 DIAGNOSIS — R296 Repeated falls: Secondary | ICD-10-CM | POA: Diagnosis present

## 2018-10-22 DIAGNOSIS — F3181 Bipolar II disorder: Secondary | ICD-10-CM | POA: Diagnosis present

## 2018-10-22 DIAGNOSIS — F3131 Bipolar disorder, current episode depressed, mild: Secondary | ICD-10-CM

## 2018-10-22 DIAGNOSIS — I639 Cerebral infarction, unspecified: Secondary | ICD-10-CM | POA: Diagnosis not present

## 2018-10-22 DIAGNOSIS — I10 Essential (primary) hypertension: Secondary | ICD-10-CM | POA: Diagnosis not present

## 2018-10-22 DIAGNOSIS — R410 Disorientation, unspecified: Secondary | ICD-10-CM | POA: Diagnosis not present

## 2018-10-22 DIAGNOSIS — T56891A Toxic effect of other metals, accidental (unintentional), initial encounter: Secondary | ICD-10-CM

## 2018-10-22 DIAGNOSIS — G43909 Migraine, unspecified, not intractable, without status migrainosus: Secondary | ICD-10-CM | POA: Diagnosis present

## 2018-10-22 DIAGNOSIS — R404 Transient alteration of awareness: Secondary | ICD-10-CM | POA: Diagnosis not present

## 2018-10-22 DIAGNOSIS — M810 Age-related osteoporosis without current pathological fracture: Secondary | ICD-10-CM | POA: Diagnosis present

## 2018-10-22 DIAGNOSIS — I480 Paroxysmal atrial fibrillation: Secondary | ICD-10-CM | POA: Diagnosis present

## 2018-10-22 DIAGNOSIS — G92 Toxic encephalopathy: Principal | ICD-10-CM | POA: Diagnosis present

## 2018-10-22 DIAGNOSIS — N179 Acute kidney failure, unspecified: Secondary | ICD-10-CM | POA: Diagnosis present

## 2018-10-22 DIAGNOSIS — Z885 Allergy status to narcotic agent status: Secondary | ICD-10-CM

## 2018-10-22 DIAGNOSIS — Z7901 Long term (current) use of anticoagulants: Secondary | ICD-10-CM

## 2018-10-22 DIAGNOSIS — Z7984 Long term (current) use of oral hypoglycemic drugs: Secondary | ICD-10-CM

## 2018-10-22 DIAGNOSIS — F419 Anxiety disorder, unspecified: Secondary | ICD-10-CM | POA: Diagnosis present

## 2018-10-22 DIAGNOSIS — Z818 Family history of other mental and behavioral disorders: Secondary | ICD-10-CM

## 2018-10-22 LAB — CBC WITH DIFFERENTIAL/PLATELET
Abs Immature Granulocytes: 0.04 10*3/uL (ref 0.00–0.07)
Basophils Absolute: 0 10*3/uL (ref 0.0–0.1)
Basophils Relative: 0 %
Eosinophils Absolute: 0 10*3/uL (ref 0.0–0.5)
Eosinophils Relative: 0 %
HCT: 40.3 % (ref 36.0–46.0)
Hemoglobin: 13.1 g/dL (ref 12.0–15.0)
Immature Granulocytes: 0 %
Lymphocytes Relative: 8 %
Lymphs Abs: 0.9 10*3/uL (ref 0.7–4.0)
MCH: 29.9 pg (ref 26.0–34.0)
MCHC: 32.5 g/dL (ref 30.0–36.0)
MCV: 92 fL (ref 80.0–100.0)
Monocytes Absolute: 0.7 10*3/uL (ref 0.1–1.0)
Monocytes Relative: 6 %
Neutro Abs: 9.4 10*3/uL — ABNORMAL HIGH (ref 1.7–7.7)
Neutrophils Relative %: 86 %
Platelets: 303 10*3/uL (ref 150–400)
RBC: 4.38 MIL/uL (ref 3.87–5.11)
RDW: 14.4 % (ref 11.5–15.5)
WBC: 11.1 10*3/uL — ABNORMAL HIGH (ref 4.0–10.5)
nRBC: 0 % (ref 0.0–0.2)

## 2018-10-22 LAB — COMPREHENSIVE METABOLIC PANEL
ALT: 14 U/L (ref 0–44)
AST: 17 U/L (ref 15–41)
Albumin: 4.6 g/dL (ref 3.5–5.0)
Alkaline Phosphatase: 78 U/L (ref 38–126)
Anion gap: 13 (ref 5–15)
BUN: 23 mg/dL (ref 8–23)
CO2: 19 mmol/L — ABNORMAL LOW (ref 22–32)
Calcium: 10.2 mg/dL (ref 8.9–10.3)
Chloride: 108 mmol/L (ref 98–111)
Creatinine, Ser: 1.25 mg/dL — ABNORMAL HIGH (ref 0.44–1.00)
GFR calc Af Amer: 51 mL/min — ABNORMAL LOW (ref >60)
GFR calc non Af Amer: 44 mL/min — ABNORMAL LOW (ref >60)
Glucose, Bld: 143 mg/dL — ABNORMAL HIGH (ref 70–99)
Potassium: 4.6 mmol/L (ref 3.5–5.1)
Sodium: 140 mmol/L (ref 135–145)
Total Bilirubin: 0.6 mg/dL (ref 0.3–1.2)
Total Protein: 7.1 g/dL (ref 6.5–8.1)

## 2018-10-22 LAB — LITHIUM LEVEL: Lithium Lvl: 1.18 mmol/L (ref 0.60–1.20)

## 2018-10-22 LAB — PROTIME-INR
INR: 2.3 — ABNORMAL HIGH (ref 0.8–1.2)
Prothrombin Time: 24.7 seconds — ABNORMAL HIGH (ref 11.4–15.2)

## 2018-10-22 LAB — ACETAMINOPHEN LEVEL: Acetaminophen (Tylenol), Serum: <10 ug/mL — ABNORMAL LOW (ref 10–30)

## 2018-10-22 LAB — CBG MONITORING, ED: Glucose-Capillary: 138 mg/dL — ABNORMAL HIGH (ref 70–99)

## 2018-10-22 LAB — LACTIC ACID, PLASMA: Lactic Acid, Venous: 1.7 mmol/L (ref 0.5–1.9)

## 2018-10-22 LAB — SALICYLATE LEVEL: Salicylate Lvl: <7 mg/dL (ref 2.8–30.0)

## 2018-10-22 MED ORDER — SODIUM CHLORIDE 0.9% FLUSH: 3.0000 mL | Freq: Once | INTRAVENOUS | Status: DC

## 2018-10-22 NOTE — ED Provider Notes (Signed)
[facility] EMERGENCY DEPARTMENT Provider Note   CSN: [id number] Arrival date & time: 10/22/18  [id number]     History   Chief Complaint Chief Complaint  Patient presents with  . Altered Mental Status    HPI Meredith [name] is a 68 y.o. female.  Presents emergency department with altered mental status.  History provided by patient and her brother.  At time of my exam patient able to answer basic questions but unable to answer any sort of complex history question.  Denies any pain, denies SI, HI, AVH.  Per her brother, patient has had general decline over the past couple weeks, recent ER visit patient was discharged home.  Since that time patient has had increased generalized weakness, not getting around the house is normal, had not been taking her medication consistently, is concerned that she has not been taking any of her meds for the past couple days.  States she has been eating and drinking well lately.  No sick contacts, no fevers, no cough or chills.     HPI  Past Medical History:  Diagnosis Date  . Anxiety   . Atrial fibrillation ([place])   . Bipolar 2 disorder ([place])   . Depression   . History of cardioversion    x2  . Hyperlipidemia   . Hypothyroidism   . Migraine headache   . Osteoporosis     Patient Active Problem List   Diagnosis Date Noted  . Hypothyroidism 02/24/2018  . Encounter for therapeutic drug monitoring 01/03/2018  . PTSD (post-traumatic stress disorder) 11/29/2017  . Falls 11/25/2017  . Disorientation   . Delirium 11/24/2017  . Stiffness of finger joint of right hand 06/18/2017  . Osteoarthritis of carpometacarpal (CMC) joint of thumb 05/21/2017  . Anxiety 11/29/2016  . Chronic pain syndrome 07/30/2016  . Bipolar disorder, curr episode mixed, severe, with psychotic features ([place]) 07/28/2016  . PAF (paroxysmal atrial fibrillation) ([place]) 11/14/2015  . Intractable chronic migraine without aura 04/24/2012    Past Surgical History:   Procedure Laterality Date  . ABLATION    . BACK SURGERY  2014  . CARDIOVERSION  12/2013, 01/2014   x2  . KNEE SURGERY     ORTHOSCOPIC  . LUNG REMOVAL, PARTIAL Right    BENIGN LUNG CYST  . THORACIC SPINE SURGERY Right   . THUMB ARTHROSCOPY       OB History   No obstetric history on file.      Home Medications    Prior to Admission medications   Medication Sig Start Date End Date Taking? Authorizing Provider  aspirin 325 MG tablet Take 325 mg by mouth daily as needed for headache.     [provider]  baclofen (LIORESAL) 10 MG tablet Take 10 mg by mouth 2 (two) times daily as needed. 04/16/18   [provider]  busPIRone (BUSPAR) 15 MG tablet TAKE 1 TABLET (15 MG TOTAL) BY MOUTH 2 (TWO) TIMES DAILY. 09/29/18   Cottle, Billey Co., MD  calcium carbonate (OS-CAL - DOSED IN MG OF ELEMENTAL CALCIUM) 1250 (500 Ca) MG tablet Take 1 tablet (500 mg of elemental calcium total) by mouth daily. For bone health 08/01/16   Lindell Spar I, NP  Cholecalciferol (VITAMIN D3) 1000 units CAPS Take 5,000 Units by mouth daily.     [provider]  clotrimazole (LOTRIMIN) 1 % cream APPLY TO AFFECTED AREA TWICE A DAY 04/01/18   [provider]  dextromethorphan-guaiFENesin (MUCINEX DM) 30-600 MG 12hr tablet Take  1 tablet by mouth 2 (two) times daily.    [provider]  diphenhydrAMINE (BENADRYL ALLERGY) 25 mg capsule Take 50 mg by mouth 4 (four) times daily.     [provider]  Erenumab-aooe (AIMOVIG) 70 MG/ML SOAJ Inject 70 mg into the skin every 30 (thirty) days.    [provider]  estradiol (ESTRACE) 1 MG tablet Take 1.5 tablets (1.5 mg total) by mouth daily. For hormone replacement Patient taking differently: Take 1 mg by mouth daily. For hormone replacement 02/24/18   [place], Betty G, MD  ferrous sulfate 325 (65 FE) MG tablet Take 1 tablet (325 mg total) by mouth daily with breakfast. Ferrous sulfate 07/31/16   Lindell Spar I, NP   HYDROcodone-acetaminophen (NORCO) 7.5-325 MG tablet Take 1 tablet by mouth 2 (two) times daily as needed. Patient taking differently: Take 1 tablet by mouth 3 (three) times daily as needed.  11/26/17   Nita Sells, MD  lamoTRIgine (LAMICTAL) 100 MG tablet Take 1 tablet (100 mg total) by mouth 2 (two) times daily. 09/15/18   Cottle, Billey Co., MD  levothyroxine (SYNTHROID, LEVOTHROID) 100 MCG tablet Take 1 tablet (100 mcg total) by mouth daily before breakfast. 05/06/18   [place], Betty G, MD  lithium carbonate 300 MG capsule 1 capsule at night for 1 week then, 2 nightly. Patient taking differently: Take 600 mg by mouth at bedtime.  09/15/18   Cottle, Billey Co., MD  LORazepam (ATIVAN) 1 MG tablet 1-2 tablets before dental procedure 09/15/18   Cottle, Billey Co., MD  Melatonin 5 MG TABS Take 15 mg by mouth at bedtime.     [provider]  metFORMIN (GLUCOPHAGE) 500 MG tablet Take 2 tablets (1,000 mg total) by mouth 2 (two) times daily with a meal. 06/23/18   Cottle, Billey Co., MD  metoprolol succinate (TOPROL-XL) 50 MG 24 hr tablet Take 1 tablet by mouth daily with or immediately following a meal. 01/27/18   [name], Wonda Cheng, MD  mupirocin ointment (BACTROBAN) 2 % Apply 1 application topically 2 (two) times daily. 04/29/18   [place], Betty G, MD  ondansetron (ZOFRAN) 4 MG tablet Take 1 tablet (4 mg total) by mouth 3 (three) times daily as needed. As needed for nausea 07/31/16   Lindell Spar I, NP  pantoprazole (PROTONIX) 40 MG tablet Take 1 tablet (40 mg total) by mouth daily. 07/07/18   [place], Betty G, MD  pravastatin (PRAVACHOL) 20 MG tablet Take 20 mg by mouth daily. 10/22/17   [provider]  pregabalin (LYRICA) 150 MG capsule Take 1 capsule (150 mg total) by mouth 3 (three) times daily. For nerve pain 11/26/17   Nita Sells, MD  QUEtiapine (SEROQUEL XR) 150 MG 24 hr tablet Take 1 tablet (150 mg total) by mouth at bedtime. 08/25/18   Cottle, Billey Co., MD   senna-docusate (PERI-COLACE) 8.6-50 MG tablet Take 2 tablets by mouth 2 (two) times daily. For constipation 07/31/16   Lindell Spar I, NP  verapamil (VERELAN PM) 120 MG 24 hr capsule Take 1 capsule (120 mg total) by mouth at bedtime. For high blood pressure 07/31/16   Lindell Spar I, NP  warfarin (COUMADIN) 2.5 MG tablet Take as directed by [facility] Patient taking differently: Take 2.5 mg by mouth daily at 6 PM. 2.5 MG everyday except on Friday patient takes 5 mg 09/29/18   [name], Wonda Cheng, MD  warfarin (COUMADIN) 5 MG tablet TAKE 1 TABLET BY MOUTH ON MONDAYS, WEDNESDAYS,  AND FRIDAYS Patient taking differently: Take 5 mg by mouth See admin instructions. 5 mg only on Friday 03/05/18   Deboraha Sprang, MD    Family History Family History  Problem Relation Age of Onset  . Arthritis Mother   . Depression Mother   . Diabetes Mother   . Heart disease Mother   . Stroke Mother   . Early death Father   . Heart disease Father   . Atrial fibrillation Brother   . Hyperlipidemia Brother   . Multiple sclerosis Sister   . Alcohol abuse Brother   . Asthma Brother   . Drug abuse Brother   . Hypertension Brother   . Mental illness Brother     Social History Social History   Tobacco Use  . Smoking status: Never Smoker  . Smokeless tobacco: Never Used  Substance Use Topics  . Alcohol use: No  . Drug use: No     Allergies   Codeine, Adhesive [tape], Celebrex [celecoxib], Cephalexin, Sulfa antibiotics, Tricyclic antidepressants, Amoxicillin, Ampicillin, and Penicillins   Review of Systems Review of Systems  Unable to perform ROS: Mental status change     Physical Exam Updated Vital Signs Temp 99.2 F (37.3 C) (Oral)   LMP  (LMP Unknown)   Physical Exam Constitutional:      Comments: Mildly confused, no acute distress, resting comfortably in bed  HENT:     Head: Normocephalic.     Comments: 1 cm superficial ecchymosis over right forehead, no deformity or laceration     Mouth/Throat:     Mouth: Mucous membranes are dry.  Eyes:     Extraocular Movements: Extraocular movements intact.     Pupils: Pupils are equal, round, and reactive to light.  Neck:     Musculoskeletal: Normal range of motion.  Cardiovascular:     Rate and Rhythm: Normal rate and regular rhythm.  Pulmonary:     Effort: Pulmonary effort is normal.     Breath sounds: Normal breath sounds.  Abdominal:     General: Abdomen is flat. Bowel sounds are normal.     Palpations: Abdomen is soft.  Musculoskeletal: Normal range of motion.        General: No swelling or tenderness.  Skin:    General: Skin is warm and dry.     Capillary Refill: Capillary refill takes less than 2 seconds.  Neurological:     Comments: GCS 14, cranial nerves II through XII intact except noted mildly slurred speech, expressive aphasia, abnormal finger-nose-finger, no pronator drift, 5 out of 5 strength in bilateral upper and lower extremities, sensation to light touch intact in all 4 extremities      ED Treatments / Results  Labs (all labs ordered are listed, but only abnormal results are displayed) Labs Reviewed  CBC WITH DIFFERENTIAL/PLATELET - Abnormal; Notable for the following components:      Result Value   WBC 11.1 (*)    Neutro Abs 9.4 (*)    All other components within normal limits  CBG MONITORING, ED - Abnormal; Notable for the following components:   Glucose-Capillary 138 (*)    All other components within normal limits  COMPREHENSIVE METABOLIC PANEL  PROTIME-INR  LACTIC ACID, PLASMA  LACTIC ACID, PLASMA  LITHIUM LEVEL  URINALYSIS, ROUTINE W REFLEX MICROSCOPIC  ACETAMINOPHEN LEVEL  SALICYLATE LEVEL    EKG None  Radiology No results found.  Procedures Procedures (including critical care time)  Medications Ordered in ED Medications  sodium chloride flush (NS) 0.9 %  injection 3 mL (has no administration in time range)     Initial Impression / Assessment and Plan / ED Course  I  have reviewed the triage vital signs and the nursing notes.  Pertinent labs & imaging results that were available during my care of the patient were reviewed by me and considered in my medical decision making (see chart for details).  Clinical Course as of Oct 22 2354  Wed Oct 22, 2018  2154 Reviewed CT, new stroke, will c/s neuro and admit hospitalist   [RD]    Clinical Course User Index [RD] Lucrezia Starch, MD       51-year-old lady presented to ER with worsening confusion, altered mental status.  Work-up concerning for new stroke on CT head.  On exam patient having some word finding difficulty, likely expressive aphasia and some confusion also noted abnormal finger-nose-finger.  Threasa Heads distribution of suspected stroke on CT head.  Consulted neurology as well as hospitalist.  Dr. Myna Hidalgo will accept.    After the discussed management above, the patient was determined to be safe for discharge.  The patient was in agreement with this plan and all questions regarding their care were answered.  ED return precautions were discussed and the patient will return to the ED with any significant worsening of condition.    Final Clinical Impressions(s) / ED Diagnoses   Final diagnoses:  Cerebrovascular accident (CVA), unspecified mechanism ([place])  Altered mental status, unspecified altered mental status type    ED Discharge Orders    None       Lucrezia Starch, MD 10/22/18 2358

## 2018-10-22 NOTE — ED Triage Notes (Signed)
Pt arrived [facility] from home where she lives with her partner and her brother. They report that the patient has been progressively altered starting 2 days ago. Pt currently A&Ox1. Pt was seen 8/23 for AKI and weakness. EMS reports hx of recent falls, lithium use, and use of coumadin however family reports she has not taken her meds in several days. VS BP192/104 HR106 O2 97% RA RR 18 CBG 124 T 98.1

## 2018-10-22 NOTE — ED Notes (Signed)
CBG Results of 138 reported to [place], Therapist, sports.

## 2018-10-22 NOTE — Telephone Encounter (Signed)
RTC to the brother as the patient is not able to cooperate in her own care at this time.  He has had trouble getting her to cooperate with going to the lab.  She is not taking any meds for the last 2 days as far as he knows.  She is confused and uncooperative even with eating.  The patient could have lithium toxicity as the cause of this problem with balance and the confusion.  The only way to rule that out is with a lithium level.  Lithium toxicity is an emergency and could result in permanent damage to her kidneys as well as other complications.  Suggest he call 911 and have her go to the ER and get a lithium level.  He agrees.  If her lithium level is normal she may require psychiatric hospitalization.  He agrees to dial 911 and inform EMT that she needs a lithium level in the ER. Lynder Parents, MD, DFAPA

## 2018-10-22 NOTE — H&P (Signed)
History and Physical    Meredith [name] [id number] DOB: [birth year] DOA: 10/22/2018  PCP: Kristen Loader, FNP   Patient coming from: Home   Chief Complaint: AMS   HPI: Meredith [name] is a 68 y.o. female with medical history significant for bipolar disorder, atrial fibrillation on Coumadin, hyperlipidemia, hypothyroidism, and chronic migraine, presenting to the emergency department for evaluation of altered mental status.  Patient's family has noted a couple weeks of general decline in terms of her cognition and general functioning, seem to be weak in general and has had recurrent falls over this interval without any significant injury appreciated.  She has not been eating or drinking much for the past week but there were no specific complaints from the patient and no fevers or chills.  She was evaluated in the emergency department for this on the night of 10/19/2018, she had unremarkable UA, mild renal insufficiency, but otherwise unremarkable blood work, and she even had MRI brain that night that was negative for any acute findings.  She was having a lot of word finding difficulty and worsening gait abnormality today, has been reached out to the patient's psychiatrist and it was recommended that she go to the nearest ED for evaluation including lithium level.   ED Course: Upon arrival to the ED, patient is found to be afebrile, saturating well on room air, and with remaining vitals also normal.  EKG features a sinus rhythm and chest x-ray is negative for acute cardiopulmonary disease.  Chemistry panel is notable for creatinine 1.25, similar to recent prior.  CBC features a mild leukocytosis.  Lactic acid is normal and INR therapeutic at 2.3.  Lithium level is within the therapeutic range and acetaminophen and salicylate levels are undetectable.  Noncontrast head CT was performed and concerning for acute infarction in the left cerebellar hemisphere that is new since the recent MRI.  Neurology was  consulted by the ED physicians and hospitalists asked to admit.  Review of Systems:  All other systems reviewed and apart from HPI, are negative.  Past Medical History:  Diagnosis Date   Anxiety    Atrial fibrillation ([place])    Bipolar 2 disorder ([place])    Depression    History of cardioversion    x2   Hyperlipidemia    Hypothyroidism    Migraine headache    Osteoporosis     Past Surgical History:  Procedure Laterality Date   ABLATION     BACK SURGERY  2014   CARDIOVERSION  12/2013, 01/2014   x2   KNEE SURGERY     ORTHOSCOPIC   LUNG REMOVAL, PARTIAL Right    BENIGN LUNG CYST   THORACIC SPINE SURGERY Right    THUMB ARTHROSCOPY       reports that she has never smoked. She has never used smokeless tobacco. She reports that she does not drink alcohol or use drugs.  Allergies  Allergen Reactions   Codeine Anaphylaxis    Patient states she can take codeine but not percocet    Adhesive [Tape]     blister   Celebrex [Celecoxib] Hives   Cephalexin    Sulfa Antibiotics Hives   Tricyclic Antidepressants     Doesn't help   Amoxicillin Rash   Ampicillin Rash   Penicillins Rash    Did it involve swelling of the face/tongue/throat, SOB, or low BP? Yes Did it involve sudden or severe rash/hives, skin peeling, or any reaction on the inside of your mouth or nose? NO Did  you need to seek medical attention at a hospital or doctor's office? NO When did it last happen? childhood If all above answers are NO, may proceed with cephalosporin use.    Family History  Problem Relation Age of Onset   Arthritis Mother    Depression Mother    Diabetes Mother    Heart disease Mother    Stroke Mother    Early death Father    Heart disease Father    Atrial fibrillation Brother    Hyperlipidemia Brother    Multiple sclerosis Sister    Alcohol abuse Brother    Asthma Brother    Drug abuse Brother    Hypertension Brother    Mental  illness Brother      Prior to Admission medications   Medication Sig Start Date End Date Taking? Authorizing Provider  aspirin 325 MG tablet Take 325 mg by mouth daily as needed for headache.     [provider]  baclofen (LIORESAL) 10 MG tablet Take 10 mg by mouth 2 (two) times daily as needed. 04/16/18   [provider]  busPIRone (BUSPAR) 15 MG tablet TAKE 1 TABLET (15 MG TOTAL) BY MOUTH 2 (TWO) TIMES DAILY. 09/29/18   Cottle, Billey Co., MD  calcium carbonate (OS-CAL - DOSED IN MG OF ELEMENTAL CALCIUM) 1250 (500 Ca) MG tablet Take 1 tablet (500 mg of elemental calcium total) by mouth daily. For bone health 08/01/16   Lindell Spar I, NP  Cholecalciferol (VITAMIN D3) 1000 units CAPS Take 5,000 Units by mouth daily.     [provider]  clotrimazole (LOTRIMIN) 1 % cream APPLY TO AFFECTED AREA TWICE A DAY 04/01/18   [provider]  dextromethorphan-guaiFENesin (MUCINEX DM) 30-600 MG 12hr tablet Take 1 tablet by mouth 2 (two) times daily.    [provider]  diphenhydrAMINE (BENADRYL ALLERGY) 25 mg capsule Take 50 mg by mouth 4 (four) times daily.     [provider]  Erenumab-aooe (AIMOVIG) 70 MG/ML SOAJ Inject 70 mg into the skin every 30 (thirty) days.    [provider]  estradiol (ESTRACE) 1 MG tablet Take 1.5 tablets (1.5 mg total) by mouth daily. For hormone replacement Patient taking differently: Take 1 mg by mouth daily. For hormone replacement 02/24/18   [place], Betty G, MD  ferrous sulfate 325 (65 FE) MG tablet Take 1 tablet (325 mg total) by mouth daily with breakfast. Ferrous sulfate 07/31/16   Lindell Spar I, NP  HYDROcodone-acetaminophen (NORCO) 7.5-325 MG tablet Take 1 tablet by mouth 2 (two) times daily as needed. Patient taking differently: Take 1 tablet by mouth 3 (three) times daily as needed.  11/26/17   Nita Sells, MD  lamoTRIgine (LAMICTAL) 100 MG tablet Take 1 tablet (100 mg total) by mouth 2 (two) times  daily. 09/15/18   Cottle, Billey Co., MD  levothyroxine (SYNTHROID, LEVOTHROID) 100 MCG tablet Take 1 tablet (100 mcg total) by mouth daily before breakfast. 05/06/18   [place], Betty G, MD  lithium carbonate 300 MG capsule 1 capsule at night for 1 week then, 2 nightly. Patient taking differently: Take 600 mg by mouth at bedtime.  09/15/18   Cottle, Billey Co., MD  LORazepam (ATIVAN) 1 MG tablet 1-2 tablets before dental procedure 09/15/18   Cottle, Billey Co., MD  Melatonin 5 MG TABS Take 15 mg by mouth at bedtime.     [provider]  metFORMIN (GLUCOPHAGE) 500 MG tablet Take 2 tablets (1,000 mg total)  by mouth 2 (two) times daily with a meal. 06/23/18   Cottle, Billey Co., MD  metoprolol succinate (TOPROL-XL) 50 MG 24 hr tablet Take 1 tablet by mouth daily with or immediately following a meal. 01/27/18   [name], Wonda Cheng, MD  mupirocin ointment (BACTROBAN) 2 % Apply 1 application topically 2 (two) times daily. 04/29/18   [place], Betty G, MD  ondansetron (ZOFRAN) 4 MG tablet Take 1 tablet (4 mg total) by mouth 3 (three) times daily as needed. As needed for nausea 07/31/16   Lindell Spar I, NP  pantoprazole (PROTONIX) 40 MG tablet Take 1 tablet (40 mg total) by mouth daily. 07/07/18   [place], Betty G, MD  pravastatin (PRAVACHOL) 20 MG tablet Take 20 mg by mouth daily. 10/22/17   [provider]  pregabalin (LYRICA) 150 MG capsule Take 1 capsule (150 mg total) by mouth 3 (three) times daily. For nerve pain 11/26/17   Nita Sells, MD  QUEtiapine (SEROQUEL XR) 150 MG 24 hr tablet Take 1 tablet (150 mg total) by mouth at bedtime. 08/25/18   Cottle, Billey Co., MD  senna-docusate (PERI-COLACE) 8.6-50 MG tablet Take 2 tablets by mouth 2 (two) times daily. For constipation 07/31/16   Lindell Spar I, NP  verapamil (VERELAN PM) 120 MG 24 hr capsule Take 1 capsule (120 mg total) by mouth at bedtime. For high blood pressure 07/31/16   Lindell Spar I, NP  warfarin (COUMADIN) 2.5 MG tablet Take as  directed by [facility] Patient taking differently: Take 2.5 mg by mouth daily at 6 PM. 2.5 MG everyday except on Friday patient takes 5 mg 09/29/18   [name], Wonda Cheng, MD  warfarin (COUMADIN) 5 MG tablet TAKE 1 TABLET BY MOUTH ON MONDAYS, WEDNESDAYS, AND FRIDAYS Patient taking differently: Take 5 mg by mouth See admin instructions. 5 mg only on Friday 03/05/18   Deboraha Sprang, MD    Physical Exam: Vitals:   10/22/18 1911 10/22/18 1930 10/22/18 2030  BP:  (!) 155/78 (!) 154/78  Pulse:  95 78  Resp:  (!) 25 19  Temp: 99.2 F (37.3 C)    TempSrc: Oral    SpO2:  100% 100%     Constitutional: NAD, lethargic   Eyes: PERTLA, lids and conjunctivae normal ENMT: Mucous membranes are moist. Posterior pharynx clear of any exudate or lesions.   Neck: normal, supple, no masses, no thyromegaly Respiratory:  no wheezing, no crackles. Normal respiratory effort. No accessory muscle use.  Cardiovascular: S1 & S2 heard, regular rate and rhythm. Mild RLE edema. Abdomen: No distension, no tenderness, soft. Bowel sounds active.  Musculoskeletal: no clubbing / cyanosis. No joint deformity upper and lower extremities.    Skin: no significant rashes, lesions, ulcers. Warm, dry, well-perfused. Neurologic: no gross facial asymmetry. Mild dysarthria, expressive aphasia. Resting tremor. Sensation intact. Strength 5/5 in all 4 limbs.  Psychiatric: Alert. Oriented to person only.    Labs on Admission: I have personally reviewed following labs and imaging studies  CBC: Recent Labs  Lab 10/19/18 2036 10/22/18 1935  WBC 9.8 11.1*  NEUTROABS 7.1 9.4*  HGB 12.2 13.1  HCT 36.9 40.3  MCV 90.4 92.0  PLT 297 [id number]   Basic Metabolic Panel: Recent Labs  Lab 10/19/18 2036 10/22/18 1935  NA 136 140  K 3.9 4.6  CL 103 108  CO2 23 19*  GLUCOSE 105* 143*  BUN 18 23  CREATININE 1.32* 1.25*  CALCIUM 9.7 10.2   GFR: Estimated Creatinine Clearance: 39.9  mL/min (A) (by C-G formula based on SCr of 1.25 mg/dL  (H)). Liver Function Tests: Recent Labs  Lab 10/19/18 2036 10/22/18 1935  AST 20 17  ALT 14 14  ALKPHOS 73 78  BILITOT 0.4 0.6  PROT 7.2 7.1  ALBUMIN 4.3 4.6   No results for input(s): LIPASE, AMYLASE in the last 168 hours. No results for input(s): AMMONIA in the last 168 hours. Coagulation Profile: Recent Labs  Lab 10/22/18 1935  INR 2.3*   Cardiac Enzymes: Recent Labs  Lab 10/20/18 0126  CKTOTAL 156   BNP (last 3 results) No results for input(s): PROBNP in the last 8760 hours. HbA1C: No results for input(s): HGBA1C in the last 72 hours. CBG: Recent Labs  Lab 10/22/18 1916  GLUCAP 138*   Lipid Profile: No results for input(s): CHOL, HDL, LDLCALC, TRIG, CHOLHDL, LDLDIRECT in the last 72 hours. Thyroid Function Tests: No results for input(s): TSH, T4TOTAL, FREET4, T3FREE, THYROIDAB in the last 72 hours. Anemia Panel: No results for input(s): VITAMINB12, FOLATE, FERRITIN, TIBC, IRON, RETICCTPCT in the last 72 hours. Urine analysis:    Component Value Date/Time   COLORURINE STRAW (A) 10/20/2018 0440   APPEARANCEUR CLEAR 10/20/2018 0440   LABSPEC 1.008 10/20/2018 0440   PHURINE 6.0 10/20/2018 0440   GLUCOSEU NEGATIVE 10/20/2018 0440   HGBUR SMALL (A) 10/20/2018 0440   BILIRUBINUR NEGATIVE 10/20/2018 0440   KETONESUR NEGATIVE 10/20/2018 0440   PROTEINUR NEGATIVE 10/20/2018 0440   NITRITE NEGATIVE 10/20/2018 0440   LEUKOCYTESUR NEGATIVE 10/20/2018 0440   Sepsis Labs: @LABRCNTIP (procalcitonin:4,lacticidven:4) )No results found for this or any previous visit (from the past 240 hour(s)).   Radiological Exams on Admission: Ct Head Wo Contrast  Result Date: 10/22/2018 CLINICAL DATA:  Altered mental status EXAM: CT HEAD WITHOUT CONTRAST TECHNIQUE: Contiguous axial images were obtained from the base of the skull through the vertex without intravenous contrast. COMPARISON:  MRI 10/20/2018 FINDINGS: Brain: Low-density in the left cerebellar hemisphere compatible with  infarct. This was not present on prior MRI. No hemorrhage. No additional acute infarction. No hydrocephalus. Vascular: No hyperdense vessel or unexpected calcification. Skull: No acute calvarial abnormality. Sinuses/Orbits: Visualized paranasal sinuses and mastoids clear. Orbital soft tissues unremarkable. 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 Electronically Signed   By: Rolm Baptise M.D.   On: 10/22/2018 21:29   Dg Chest Portable 1 View  Result Date: 10/22/2018 CLINICAL DATA:  Confusion.  Sepsis. EXAM: PORTABLE CHEST 1 VIEW COMPARISON:  Chest x-ray dated March 22, 2018. FINDINGS: Patient is rotated to the right. The heart size and mediastinal contours are within normal limits. Normal pulmonary vascularity. No focal consolidation, pleural effusion, or pneumothorax. Unchanged elevation of the right hemidiaphragm. No acute osseous abnormality. Prior thoracic spine posterior fusion again noted. IMPRESSION: No active disease. Electronically Signed   By: Titus Dubin M.D.   On: 10/22/2018 20:21    EKG: Independently reviewed. Sinus rhythm, rate 91, QTc 454 ms.   Assessment/Plan   1. 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 Acute encephalopathy  - Presents with progressive gen weakness and falls over past 2 weeks, found to have acute CVA, but  the gen weakness and falls had begun prior to the MRI brain on 8/24 that was negative  - Pt's brother raises concern for medication mismanagement after seeing open pill bottles at her house with pills scattered about  - Lithium level appears to be non-toxic  - Will check TSH, ammonia, CK,  RPR, B12, folate, and UA while continuing supportive care and CVA mgmt as above    3. Paroxysmal atrial fibrillation  - In sinus rhythm on admission  - CHADS-VASc is at least 4 (CVA x2, age, gender)  - INR is therapeutic at 2.3 on admission  - NPO pending swallow screen    4. Mild renal insufficiency  - SCr is 1.25 on admission, was 1.32 three days ago but <1 prior to that  - Renally-dose medications, continue IVF hydration while NPO, repeat chem panel in am    5. Hypothyroidism  - Check TSH as above, continue Synthroid pending pharmacy med-rec   6. Bipolar disorder  - Pharmacy med-rec pending    7. Chronic pain  - Pharmacy med-rec pending   8. Right calf swelling and tenderness  - Right calf is larger than left and tender on admission  - ? DVT, INR is therapeutic, will check venous US     PPE: Mask, face shield  DVT prophylaxis: warfarin pta  Code Status: Full  Family Communication: Discussed with patient  Consults called: Neurology  Admission status: Observation     Vianne Bulls, MD [facility] Pager [phone]  If 7PM-7AM, please contact night-coverage [email or web address] Password [facility]  10/22/2018, 11:16 PM

## 2018-10-22 NOTE — Telephone Encounter (Signed)
Patient's brother called and said that he is very worried about Meredith [name]. She is very unstable on her feet. She is un able to communicate very well. When she starts to speak she forgets part of what she is trying to say.. She has been to her pcp and to the er. She has had an mri done , fluids and blood work.. Her numbers were better from Friday at  pcp when done on Sunday at the hospital. But she can;t tell her brother what meds she has taken or not. Her condition is worsening daily. His fear is that she has either forgotten to take a medicine or overtaken a medicine and he is at a lost .  Please call him ASAP at [phone]. He is afraid to leave her.

## 2018-10-22 NOTE — Telephone Encounter (Signed)
She needs a lithium level ASAP.  We have not made any other recent med changes that could be causing this but we need to rule out lithium toxicity.  I can send a lab request.  I will go ahead and send one to [facility].  If for some reason they prefer [facility] let us know.

## 2018-10-22 NOTE — Telephone Encounter (Signed)
Specifically have the brother take her to get the lab test today.

## 2018-10-22 NOTE — Consult Note (Addendum)
Neurology Consultation  Reason for Consult: Stroke Referring Physician: Dr. Myna Hidalgo  CC: Altered mental status  History is obtained from: Chart review  HPI: Meredith [name] is a 68 y.o. female past medical history of atrial fibrillation on Coumadin, bipolar disorder on lithium, hyper lipidemia, anxiety, hypothyroidism and migraines brought in for evaluation of altered mental status. According to the family member who brought her in, patient has had a general decline in her mentation over the last couple of weeks.  She was seen recently in the ER and discharged home after basic labs and imaging were unremarkable.  Her generalized weakness has worsened since then she is not able to ambulate by herself.  There is concern of medication noncompliance as well.  Of the last 1 week, she has not been eating or drinking well either. According to chart review, there is no sick contact, no fevers cough chills reported. The patient is not able to provide any history at this time.  I was able to speak with brother Mr. [name] over the phone later on. He says that she has not been feeling well at least since Friday but she told the doctors on Friday that she has not been feeling well for 2 weeks.  She was down on the floor and could not get up on Friday morning and her brother got to her house to help her up.  He took her to the PCP on Friday afternoon.  Her blood and urine labs were sent and her PCP calle back to say that day that there was something not right in her urinalysis.  She was referred to come to the emergency room.  Blood work was checked, MRI brain was done which was negative and she was sent home.  She continued to feel unwell, not eat and drink as much.  The brother stayed with her for 2 days and then went home and returned back this morning to noticed that she has a bottle of medications which he does not remember what it was open with multiple pills spilled on the ground.  He could not get her to come to  the ER by getting her into the car and EMS was called and she was brought to the emergency room.  Noncontrast head CT in the emergency room showed a new left cerebellar infarct new from the MRI done 3 days ago.  Neurology was consulted for stroke management and altered mental status management.  LKW: 2 weeks ago tpa given?: no, outside the window Premorbid modified Rankin scale (mRS): 4  ROS  ROS was performed and is negative except as noted in the HPI.   Past Medical History:  Diagnosis Date  . Anxiety   . Atrial fibrillation ([place])   . Bipolar 2 disorder ([place])   . Depression   . History of cardioversion    x2  . Hyperlipidemia   . Hypothyroidism   . Migraine headache   . Osteoporosis     Family History  Problem Relation Age of Onset  . Arthritis Mother   . Depression Mother   . Diabetes Mother   . Heart disease Mother   . Stroke Mother   . Early death Father   . Heart disease Father   . Atrial fibrillation Brother   . Hyperlipidemia Brother   . Multiple sclerosis Sister   . Alcohol abuse Brother   . Asthma Brother   . Drug abuse Brother   . Hypertension Brother   . Mental illness Brother  Social History:   reports that she has never smoked. She has never used smokeless tobacco. She reports that she does not drink alcohol or use drugs.  Medications  Current Facility-Administered Medications:  .  sodium chloride flush (NS) 0.9 % injection 3 mL, 3 mL, Intravenous, Once, Dykstra, Ellwood Dense, MD  Current Outpatient Medications:  .  aspirin 325 MG tablet, Take 325 mg by mouth daily as needed for headache. , Disp: , Rfl:  .  baclofen (LIORESAL) 10 MG tablet, Take 10 mg by mouth 2 (two) times daily as needed., Disp: , Rfl:  .  busPIRone (BUSPAR) 15 MG tablet, TAKE 1 TABLET (15 MG TOTAL) BY MOUTH 2 (TWO) TIMES DAILY., Disp: 60 tablet, Rfl: 1 .  calcium carbonate (OS-CAL - DOSED IN MG OF ELEMENTAL CALCIUM) 1250 (500 Ca) MG tablet, Take 1 tablet (500 mg of elemental  calcium total) by mouth daily. For bone health, Disp: , Rfl:  .  Cholecalciferol (VITAMIN D3) 1000 units CAPS, Take 5,000 Units by mouth daily. , Disp: , Rfl:  .  clotrimazole (LOTRIMIN) 1 % cream, APPLY TO AFFECTED AREA TWICE A DAY, Disp: , Rfl:  .  dextromethorphan-guaiFENesin (MUCINEX DM) 30-600 MG 12hr tablet, Take 1 tablet by mouth 2 (two) times daily., Disp: , Rfl:  .  diphenhydrAMINE (BENADRYL ALLERGY) 25 mg capsule, Take 50 mg by mouth 4 (four) times daily. , Disp: , Rfl:  .  Erenumab-aooe (AIMOVIG) 70 MG/ML SOAJ, Inject 70 mg into the skin every 30 (thirty) days., Disp: , Rfl:  .  estradiol (ESTRACE) 1 MG tablet, Take 1.5 tablets (1.5 mg total) by mouth daily. For hormone replacement (Patient taking differently: Take 1 mg by mouth daily. For hormone replacement), Disp: 135 tablet, Rfl: 2 .  ferrous sulfate 325 (65 FE) MG tablet, Take 1 tablet (325 mg total) by mouth daily with breakfast. Ferrous sulfate, Disp: , Rfl: 3 .  HYDROcodone-acetaminophen (NORCO) 7.5-325 MG tablet, Take 1 tablet by mouth 2 (two) times daily as needed. (Patient taking differently: Take 1 tablet by mouth 3 (three) times daily as needed. ), Disp: 2 tablet, Rfl: 0 .  lamoTRIgine (LAMICTAL) 100 MG tablet, Take 1 tablet (100 mg total) by mouth 2 (two) times daily., Disp: 180 tablet, Rfl: 0 .  levothyroxine (SYNTHROID, LEVOTHROID) 100 MCG tablet, Take 1 tablet (100 mcg total) by mouth daily before breakfast., Disp: 90 tablet, Rfl: 3 .  lithium carbonate 300 MG capsule, 1 capsule at night for 1 week then, 2 nightly. (Patient taking differently: Take 600 mg by mouth at bedtime. ), Disp: 60 capsule, Rfl: 1 .  LORazepam (ATIVAN) 1 MG tablet, 1-2 tablets before dental procedure, Disp: 2 tablet, Rfl: 0 .  Melatonin 5 MG TABS, Take 15 mg by mouth at bedtime. , Disp: , Rfl:  .  metFORMIN (GLUCOPHAGE) 500 MG tablet, Take 2 tablets (1,000 mg total) by mouth 2 (two) times daily with a meal., Disp: 360 tablet, Rfl: 1 .  metoprolol  succinate (TOPROL-XL) 50 MG 24 hr tablet, Take 1 tablet by mouth daily with or immediately following a meal., Disp: 30 tablet, Rfl: 11 .  mupirocin ointment (BACTROBAN) 2 %, Apply 1 application topically 2 (two) times daily., Disp: 30 g, Rfl: 0 .  ondansetron (ZOFRAN) 4 MG tablet, Take 1 tablet (4 mg total) by mouth 3 (three) times daily as needed. As needed for nausea, Disp: 1 tablet, Rfl: 0 .  pantoprazole (PROTONIX) 40 MG tablet, Take 1 tablet (40 mg total) by mouth  daily., Disp: 90 tablet, Rfl: 0 .  pravastatin (PRAVACHOL) 20 MG tablet, Take 20 mg by mouth daily., Disp: , Rfl: 3 .  pregabalin (LYRICA) 150 MG capsule, Take 1 capsule (150 mg total) by mouth 3 (three) times daily. For nerve pain, Disp: 2 capsule, Rfl: 0 .  QUEtiapine (SEROQUEL XR) 150 MG 24 hr tablet, Take 1 tablet (150 mg total) by mouth at bedtime., Disp: 30 tablet, Rfl: 2 .  senna-docusate (PERI-COLACE) 8.6-50 MG tablet, Take 2 tablets by mouth 2 (two) times daily. For constipation, Disp: , Rfl:  .  verapamil (VERELAN PM) 120 MG 24 hr capsule, Take 1 capsule (120 mg total) by mouth at bedtime. For high blood pressure, Disp: , Rfl:  .  warfarin (COUMADIN) 2.5 MG tablet, Take as directed by [facility] (Patient taking differently: Take 2.5 mg by mouth daily at 6 PM. 2.5 MG everyday except on Friday patient takes 5 mg), Disp: 30 tablet, Rfl: 1 .  warfarin (COUMADIN) 5 MG tablet, TAKE 1 TABLET BY MOUTH ON MONDAYS, WEDNESDAYS, AND FRIDAYS (Patient taking differently: Take 5 mg by mouth See admin instructions. 5 mg only on Friday), Disp: 30 tablet, Rfl: 1  Exam: Current vital signs: BP (!) 154/78   Pulse 78   Temp 99.2 F (37.3 C) (Oral)   Resp 19   LMP  (LMP Unknown)   SpO2 100%  Vital signs in last 24 hours: Temp:  [99.2 F (37.3 C)] 99.2 F (37.3 C) (08/26 1911) Pulse Rate:  [78-95] 78 (08/26 2030) Resp:  [19-25] 19 (08/26 2030) BP: (154-155)/(78) 154/78 (08/26 2030) SpO2:  [100 %] 100 % (08/26 2030) General:  Patient is awake, alert, oriented to self, in no apparent distress HEENT: Normocephalic atraumatic dry mucous membranes Lungs: Breathing normally saturating well on room air Cardiovascular: Regular rate rhythm Extremities: Warm well perfused with trace edema in bilateral legs Neurological exam she is awake, alert, oriented to self. She does not follow all commands consistently She is unable to answer orientation questions properly and perseverates the last word said by the examiner.  Her speech is dysarthric. Cranial nerves: Pupils are equal round react light, she does not track the examiner on either side, she blinks to threat from both sides.  Face appears symmetric.  There is symmetric and rhythmic twitching observed of the eyelids and the whole face. Motor: No focal asymmetry but very difficult to examine.  No cogwheeling. Sensory exam: Grimaces to noxious stimulation all over Not cooperative for to perform finger-nose-finger testing NIHSS 1a Level of Conscious.: 0 1b LOC Questions: 2 1c LOC Commands: 2 2 Best Gaze: 0 3 Visual: 0 4 Facial Palsy: 0 5a Motor Arm - left: 0 5b Motor Arm - Right: 0 6a Motor Leg - Left: 0 6b Motor Leg - Right: 0 7 Limb Ataxia: 0 8 Sensory: 0 9 Best Language: 2 10 Dysarthria: 2 11 Extinct. and Inatten.: 0 TOTAL: 8   Labs I have reviewed labs in epic and the results pertinent to this consultation are: CBC    Component Value Date/Time   WBC 11.1 (H) 10/22/2018 1935   RBC 4.38 10/22/2018 1935   HGB 13.1 10/22/2018 1935   HCT 40.3 10/22/2018 1935   PLT 303 10/22/2018 1935   MCV 92.0 10/22/2018 1935   MCH 29.9 10/22/2018 1935   MCHC 32.5 10/22/2018 1935   RDW 14.4 10/22/2018 1935   LYMPHSABS 0.9 10/22/2018 1935   MONOABS 0.7 10/22/2018 1935   EOSABS 0.0 10/22/2018 1935  BASOSABS 0.0 10/22/2018 1935    CMP     Component Value Date/Time   NA 140 10/22/2018 1935   NA 145 (H) 12/30/2017 1645   K 4.6 10/22/2018 1935   CL 108 10/22/2018  1935   CO2 19 (L) 10/22/2018 1935   GLUCOSE 143 (H) 10/22/2018 1935   BUN 23 10/22/2018 1935   BUN 6 (L) 12/30/2017 1645   CREATININE 1.25 (H) 10/22/2018 1935   CALCIUM 10.2 10/22/2018 1935   PROT 7.1 10/22/2018 1935   ALBUMIN 4.6 10/22/2018 1935   AST 17 10/22/2018 1935   ALT 14 10/22/2018 1935   ALKPHOS 78 10/22/2018 1935   BILITOT 0.6 10/22/2018 1935   GFRNONAA 44 (L) 10/22/2018 1935   GFRAA 51 (L) 10/22/2018 1935  Lithium level-1.18  Imaging I have reviewed the images obtained: CT-scan of the brain with new hypodensity in the left cerebellum compared to a scan from 2 days ago. MRI examination of the brain-done 2 days ago with no acute stroke.  Assessment: 68 year old woman with a complicated past history of atrial fibrillation Coumadin, bipolar disorder on lithium, hyperlipidemia, anxiety, hypothyroidism and migraines brought in for evaluation of altered mental status.  Symptoms have been ongoing now for about a couple weeks but over the last 2 days she has become more altered-unable to carry out a conversation which she could totally do normally at baseline and decreased appetite as well as concerning twitching all over. There was some concern for medication noncompliance as well as possible accidental over ingestion of some medications by her brother. CT head with a possible new left cerebellar infarct. Overall her picture is pretty complicated and I think she has multiple things going on at this time including a possible stroke as well as toxic metabolic encephalopathy.  Impression: Valuate for stroke Evaluate for toxic metabolic encephalopathy Evaluate for possible accidental overdose drug ingestion  Recommendations: Admit to hospitalist Telemetry MRI brain without contrast MRA head without contrast Carotid Dopplers I would obtain an EEG in the morning as well Hold anticoagulation for now.  Aspirin 81 for now. Atorvastatin 2D echo A1c Lipid panel Try to get more  details on what medications she might have mistakenly overdosed on.  She is on multiple medications that can cause sedating and altered mental status side effects including Klonopin, gabapentin, baclofen, buspirone, Lamictal, hydrocodone acetaminophen, lithium, Ativan and Seroquel.  We will follow with you.  -- Amie Portland, MD [facility] Pager: [phone] If 7pm to 7am, please call on call as listed on AMION.   ADDENDUM MRI shows that the CT finding concerning for stroke is artifactual. Stroke w/u not needed. Obtain EEG. Strongly suspect toxic metabolic encephalopathy secondary to polypharmacy.  -- Amie Portland, MD [facility] Pager: [phone] If 7pm to 7am, please call on call as listed on AMION.

## 2018-10-23 ENCOUNTER — Observation Stay (HOSPITAL_COMMUNITY): Payer: Medicare Other

## 2018-10-23 ENCOUNTER — Encounter (HOSPITAL_COMMUNITY): Payer: Self-pay | Admitting: General Practice

## 2018-10-23 DIAGNOSIS — Z7901 Long term (current) use of anticoagulants: Secondary | ICD-10-CM | POA: Diagnosis not present

## 2018-10-23 DIAGNOSIS — G43909 Migraine, unspecified, not intractable, without status migrainosus: Secondary | ICD-10-CM | POA: Diagnosis present

## 2018-10-23 DIAGNOSIS — R52 Pain, unspecified: Secondary | ICD-10-CM | POA: Diagnosis not present

## 2018-10-23 DIAGNOSIS — Z79891 Long term (current) use of opiate analgesic: Secondary | ICD-10-CM | POA: Diagnosis not present

## 2018-10-23 DIAGNOSIS — F3163 Bipolar disorder, current episode mixed, severe, without psychotic features: Secondary | ICD-10-CM | POA: Diagnosis present

## 2018-10-23 DIAGNOSIS — G894 Chronic pain syndrome: Secondary | ICD-10-CM | POA: Diagnosis present

## 2018-10-23 DIAGNOSIS — N179 Acute kidney failure, unspecified: Secondary | ICD-10-CM | POA: Diagnosis present

## 2018-10-23 DIAGNOSIS — N289 Disorder of kidney and ureter, unspecified: Secondary | ICD-10-CM | POA: Diagnosis not present

## 2018-10-23 DIAGNOSIS — Z7401 Bed confinement status: Secondary | ICD-10-CM | POA: Diagnosis not present

## 2018-10-23 DIAGNOSIS — R4701 Aphasia: Secondary | ICD-10-CM | POA: Diagnosis present

## 2018-10-23 DIAGNOSIS — I639 Cerebral infarction, unspecified: Secondary | ICD-10-CM | POA: Diagnosis not present

## 2018-10-23 DIAGNOSIS — Z79899 Other long term (current) drug therapy: Secondary | ICD-10-CM | POA: Diagnosis not present

## 2018-10-23 DIAGNOSIS — F419 Anxiety disorder, unspecified: Secondary | ICD-10-CM | POA: Diagnosis present

## 2018-10-23 DIAGNOSIS — E039 Hypothyroidism, unspecified: Secondary | ICD-10-CM | POA: Diagnosis present

## 2018-10-23 DIAGNOSIS — M810 Age-related osteoporosis without current pathological fracture: Secondary | ICD-10-CM | POA: Diagnosis present

## 2018-10-23 DIAGNOSIS — R278 Other lack of coordination: Secondary | ICD-10-CM | POA: Diagnosis not present

## 2018-10-23 DIAGNOSIS — R531 Weakness: Secondary | ICD-10-CM | POA: Diagnosis not present

## 2018-10-23 DIAGNOSIS — M79661 Pain in right lower leg: Secondary | ICD-10-CM | POA: Diagnosis present

## 2018-10-23 DIAGNOSIS — Z79890 Hormone replacement therapy (postmenopausal): Secondary | ICD-10-CM | POA: Diagnosis not present

## 2018-10-23 DIAGNOSIS — I361 Nonrheumatic tricuspid (valve) insufficiency: Secondary | ICD-10-CM | POA: Diagnosis not present

## 2018-10-23 DIAGNOSIS — R5381 Other malaise: Secondary | ICD-10-CM | POA: Diagnosis not present

## 2018-10-23 DIAGNOSIS — F3181 Bipolar II disorder: Secondary | ICD-10-CM | POA: Diagnosis present

## 2018-10-23 DIAGNOSIS — R4182 Altered mental status, unspecified: Secondary | ICD-10-CM | POA: Diagnosis not present

## 2018-10-23 DIAGNOSIS — I351 Nonrheumatic aortic (valve) insufficiency: Secondary | ICD-10-CM | POA: Diagnosis not present

## 2018-10-23 DIAGNOSIS — R609 Edema, unspecified: Secondary | ICD-10-CM | POA: Diagnosis not present

## 2018-10-23 DIAGNOSIS — F4310 Post-traumatic stress disorder, unspecified: Secondary | ICD-10-CM | POA: Diagnosis present

## 2018-10-23 DIAGNOSIS — R296 Repeated falls: Secondary | ICD-10-CM | POA: Diagnosis present

## 2018-10-23 DIAGNOSIS — T56891S Toxic effect of other metals, accidental (unintentional), sequela: Secondary | ICD-10-CM | POA: Diagnosis not present

## 2018-10-23 DIAGNOSIS — G9340 Encephalopathy, unspecified: Secondary | ICD-10-CM | POA: Diagnosis not present

## 2018-10-23 DIAGNOSIS — M7989 Other specified soft tissue disorders: Secondary | ICD-10-CM | POA: Diagnosis present

## 2018-10-23 DIAGNOSIS — R41841 Cognitive communication deficit: Secondary | ICD-10-CM | POA: Diagnosis not present

## 2018-10-23 DIAGNOSIS — M2550 Pain in unspecified joint: Secondary | ICD-10-CM | POA: Diagnosis not present

## 2018-10-23 DIAGNOSIS — I4891 Unspecified atrial fibrillation: Secondary | ICD-10-CM | POA: Diagnosis not present

## 2018-10-23 DIAGNOSIS — Z20828 Contact with and (suspected) exposure to other viral communicable diseases: Secondary | ICD-10-CM | POA: Diagnosis present

## 2018-10-23 DIAGNOSIS — E785 Hyperlipidemia, unspecified: Secondary | ICD-10-CM | POA: Diagnosis present

## 2018-10-23 DIAGNOSIS — R2689 Other abnormalities of gait and mobility: Secondary | ICD-10-CM | POA: Diagnosis not present

## 2018-10-23 DIAGNOSIS — I480 Paroxysmal atrial fibrillation: Secondary | ICD-10-CM | POA: Diagnosis present

## 2018-10-23 DIAGNOSIS — Z7982 Long term (current) use of aspirin: Secondary | ICD-10-CM | POA: Diagnosis not present

## 2018-10-23 DIAGNOSIS — G92 Toxic encephalopathy: Secondary | ICD-10-CM | POA: Diagnosis present

## 2018-10-23 DIAGNOSIS — Z7984 Long term (current) use of oral hypoglycemic drugs: Secondary | ICD-10-CM | POA: Diagnosis not present

## 2018-10-23 DIAGNOSIS — F3164 Bipolar disorder, current episode mixed, severe, with psychotic features: Secondary | ICD-10-CM | POA: Diagnosis not present

## 2018-10-23 DIAGNOSIS — T43595A Adverse effect of other antipsychotics and neuroleptics, initial encounter: Secondary | ICD-10-CM | POA: Diagnosis present

## 2018-10-23 LAB — URINALYSIS, ROUTINE W REFLEX MICROSCOPIC
Glucose, UA: NEGATIVE mg/dL
Ketones, ur: 15 mg/dL — AB
Leukocytes,Ua: NEGATIVE
Nitrite: NEGATIVE
Protein, ur: 30 mg/dL — AB
Specific Gravity, Urine: 1.025 (ref 1.005–1.030)
pH: 6.5 (ref 5.0–8.0)

## 2018-10-23 LAB — URINALYSIS, MICROSCOPIC (REFLEX): WBC, UA: NONE SEEN WBC/hpf (ref 0–5)

## 2018-10-23 LAB — PROTIME-INR
INR: 2.3 — ABNORMAL HIGH (ref 0.8–1.2)
Prothrombin Time: 24.7 seconds — ABNORMAL HIGH (ref 11.4–15.2)

## 2018-10-23 LAB — CK: Total CK: 54 U/L (ref 38–234)

## 2018-10-23 LAB — VITAMIN B12: Vitamin B-12: 312 pg/mL (ref 180–914)

## 2018-10-23 LAB — LIPID PANEL
Cholesterol: 212 mg/dL — ABNORMAL HIGH (ref 0–200)
HDL: 94 mg/dL (ref >40)
LDL Cholesterol: 97 mg/dL (ref 0–99)
Total CHOL/HDL Ratio: 2.3 RATIO
Triglycerides: 103 mg/dL (ref <150)
VLDL: 21 mg/dL (ref 0–40)

## 2018-10-23 LAB — TSH: TSH: 4.237 u[IU]/mL (ref 0.350–4.500)

## 2018-10-23 LAB — SARS CORONAVIRUS 2 (TAT 6-24 HRS): SARS Coronavirus 2: NEGATIVE

## 2018-10-23 LAB — HEMOGLOBIN A1C
Hgb A1c MFr Bld: 6 % — ABNORMAL HIGH (ref 4.8–5.6)
Mean Plasma Glucose: 125.5 mg/dL

## 2018-10-23 LAB — HIV ANTIBODY (ROUTINE TESTING W REFLEX): HIV Screen 4th Generation wRfx: NONREACTIVE

## 2018-10-23 LAB — AMMONIA: Ammonia: 17 umol/L (ref 9–35)

## 2018-10-23 LAB — RPR: RPR Ser Ql: NONREACTIVE

## 2018-10-23 LAB — ECHOCARDIOGRAM COMPLETE

## 2018-10-23 MED ORDER — SENNOSIDES-DOCUSATE SODIUM 8.6-50 MG PO TABS: 1.0000 | ORAL_TABLET | Freq: Every evening | ORAL | Status: DC | PRN

## 2018-10-23 MED ORDER — QUETIAPINE FUMARATE ER 50 MG PO TB24
150.0000 mg | ORAL_TABLET | Freq: Every day | ORAL | Status: DC
  Administered 2018-10-25 – 2018-10-27 (×3): 150 mg via ORAL
  Filled 2018-10-23 (×6): qty 3

## 2018-10-23 MED ORDER — WARFARIN - PHARMACIST DOSING INPATIENT
Freq: Every day | Status: DC
  Administered 2018-10-23 – 2018-10-27 (×3)

## 2018-10-23 MED ORDER — SODIUM CHLORIDE 0.9 % IV SOLN
INTRAVENOUS | Status: DC
  Administered 2018-10-23: 100 mL/h via INTRAVENOUS

## 2018-10-23 MED ORDER — BUSPIRONE HCL 10 MG PO TABS: 15.0000 mg | ORAL_TABLET | Freq: Two times a day (BID) | ORAL | Status: DC

## 2018-10-23 MED ORDER — LORAZEPAM 2 MG/ML IJ SOLN
2.0000 mg | Freq: Once | INTRAMUSCULAR | Status: AC
  Administered 2018-10-23: 2 mg via INTRAVENOUS
  Filled 2018-10-23: qty 1

## 2018-10-23 MED ORDER — LEVOTHYROXINE SODIUM 100 MCG PO TABS
100.0000 ug | ORAL_TABLET | Freq: Every day | ORAL | Status: DC
  Administered 2018-10-27 – 2018-10-28 (×2): 100 ug via ORAL
  Filled 2018-10-23 (×3): qty 1

## 2018-10-23 MED ORDER — WARFARIN SODIUM 2.5 MG PO TABS
2.5000 mg | ORAL_TABLET | Freq: Once | ORAL | Status: DC
  Filled 2018-10-23: qty 1

## 2018-10-23 MED ORDER — ACETAMINOPHEN 650 MG RE SUPP: 650.0000 mg | RECTAL | Status: DC | PRN

## 2018-10-23 MED ORDER — SODIUM CHLORIDE 0.9 % IV SOLN: INTRAVENOUS | Status: DC

## 2018-10-23 MED ORDER — ACETAMINOPHEN 325 MG PO TABS: 650.0000 mg | ORAL_TABLET | ORAL | Status: DC | PRN

## 2018-10-23 MED ORDER — STROKE: EARLY STAGES OF RECOVERY BOOK
Freq: Once | Status: AC
  Administered 2018-10-25: 09:00:00
  Filled 2018-10-23: qty 1

## 2018-10-23 MED ORDER — LAMOTRIGINE 100 MG PO TABS
100.0000 mg | ORAL_TABLET | Freq: Two times a day (BID) | ORAL | Status: DC
  Administered 2018-10-25 – 2018-10-28 (×7): 100 mg via ORAL
  Filled 2018-10-23 (×10): qty 1

## 2018-10-23 MED ORDER — METOPROLOL TARTRATE 25 MG PO TABS
25.0000 mg | ORAL_TABLET | Freq: Two times a day (BID) | ORAL | Status: DC
  Administered 2018-10-25 – 2018-10-28 (×7): 25 mg via ORAL
  Filled 2018-10-23 (×8): qty 1

## 2018-10-23 MED ORDER — ACETAMINOPHEN 160 MG/5ML PO SOLN: 650.0000 mg | ORAL | Status: DC | PRN

## 2018-10-23 NOTE — ED Notes (Addendum)
ED TO INPATIENT HANDOFF REPORT  ED Nurse Name and Phone #:  [phone]  S Name/Age/Gender Meredith [name] 68 y.o. female Room/Bed: RESUSC/RESUSC  Code Status   Code Status: Full Code  Home/SNF/Other Home Patient oriented to: self Is this baseline? No   Triage Complete: Triage complete  Chief Complaint AMS  Triage Note Pt arrived [facility] from home where she lives with her partner and her brother. They report that the patient has been progressively altered starting 2 days ago. Pt currently A&Ox1. Pt was seen 8/23 for AKI and weakness. EMS reports hx of recent falls, lithium use, and use of coumadin however family reports she has not taken her meds in several days. VS BP192/104 HR106 O2 97% RA RR 18 CBG 124 T 98.1   Allergies Allergies  Allergen Reactions  . Codeine Anaphylaxis    Patient states she can take codeine but not percocet   . Adhesive [Tape]     blister  . Celebrex [Celecoxib] Hives  . Sulfa Antibiotics Hives  . Tricyclic Antidepressants     Doesn't help  . Amoxicillin Rash  . Ampicillin Rash  . Cephalexin Rash  . Penicillins Rash    Did it involve swelling of the face/tongue/throat, SOB, or low BP? Yes Did it involve sudden or severe rash/hives, skin peeling, or any reaction on the inside of your mouth or nose? NO Did you need to seek medical attention at a hospital or doctor's office? NO When did it last happen? childhood If all above answers are "NO", may proceed with cephalosporin use.    Level of Care/Admitting Diagnosis ED Disposition    ED Disposition Condition [facility] Area: [place] [100100]  Level of Care: Telemetry Medical [104]  I expect the patient will be discharged within 24 hours: Yes  LOW acuity---Tx typically complete <24 hrs---ACUTE conditions typically can be evaluated <24 hours---LABS likely to return to acceptable levels <24 hours---IS near functional baseline---EXPECTED to return to current  living arrangement---NOT newly hypoxic: Does not meet criteria for 5C-Observation unit  Covid Evaluation: Asymptomatic Screening Protocol (No Symptoms)  Diagnosis: Acute ischemic stroke [id number]  Admitting Physician: Vianne Bulls [id number]  Attending Physician: Vianne Bulls [id number]  PT Class (Do Not Modify): Observation [104]  PT Acc Code (Do Not Modify): Observation [10022]       B Medical/Surgery History Past Medical History:  Diagnosis Date  . Anxiety   . Atrial fibrillation ([place])   . Bipolar 2 disorder ([place])   . Depression   . History of cardioversion    x2  . Hyperlipidemia   . Hypothyroidism   . Migraine headache   . Osteoporosis    Past Surgical History:  Procedure Laterality Date  . ABLATION    . BACK SURGERY  2014  . CARDIOVERSION  12/2013, 01/2014   x2  . KNEE SURGERY     ORTHOSCOPIC  . LUNG REMOVAL, PARTIAL Right    BENIGN LUNG CYST  . THORACIC SPINE SURGERY Right   . THUMB ARTHROSCOPY       A IV Location/Drains/Wounds Patient Lines/Drains/Airways Status   Active Line/Drains/Airways    Name:   Placement date:   Placement time:   Site:   Days:   Peripheral IV 10/22/18 Right Hand   10/22/18    2302    Hand   1          Intake/Output Last 24 hours No intake or output data in the 24  hours ending 10/23/18 0542  Labs/Imaging Results for orders placed or performed during the hospital encounter of 10/22/18 (from the past 48 hour(s))  CBG monitoring, ED     Status: Abnormal   Collection Time: 10/22/18  7:16 PM  Result Value Ref Range   Glucose-Capillary 138 (H) 70 - 99 mg/dL  Acetaminophen level     Status: Abnormal   Collection Time: 10/22/18  7:33 PM  Result Value Ref Range   Acetaminophen (Tylenol), Serum <10 (L) 10 - 30 ug/mL    Comment: (NOTE) Therapeutic concentrations vary significantly. A range of 10-30 ug/mL  may be an effective concentration for many patients. However, some  are best treated at concentrations outside of this  range. Acetaminophen concentrations >150 ug/mL at 4 hours after ingestion  and >50 ug/mL at 12 hours after ingestion are often associated with  toxic reactions. Performed at [facility], [place] [address] [place], [place] [id number]   Salicylate level     Status: None   Collection Time: 10/22/18  7:33 PM  Result Value Ref Range   Salicylate Lvl [id number] 2.8 - 30.0 mg/dL    Comment: Performed at [facility] [address], [place], [place] [address]  Comprehensive metabolic panel     Status: Abnormal   Collection Time: 10/22/18  7:35 PM  Result Value Ref Range   Sodium 140 135 - 145 mmol/L   Potassium 4.6 3.5 - 5.1 mmol/L   Chloride 108 98 - 111 mmol/L   CO2 19 (L) 22 - 32 mmol/L   Glucose, Bld 143 (H) 70 - 99 mg/dL   BUN 23 8 - 23 mg/dL   Creatinine, Ser 1.25 (H) 0.44 - 1.00 mg/dL   Calcium 10.2 8.9 - 10.3 mg/dL   Total Protein 7.1 6.5 - 8.1 g/dL   Albumin 4.6 3.5 - 5.0 g/dL   AST 17 15 - 41 U/L   ALT 14 0 - 44 U/L   Alkaline Phosphatase 78 38 - 126 U/L   Total Bilirubin 0.6 0.3 - 1.2 mg/dL   GFR calc non Af Amer 44 (L) >60 mL/min   GFR calc Af Amer 51 (L) >60 mL/min   Anion gap 13 5 - 15    Comment: Performed at [facility] [address], [place], [place] [address]  Protime-INR - (order if patient is taking Coumadin / Warfarin)     Status: Abnormal   Collection Time: 10/22/18  7:35 PM  Result Value Ref Range   Prothrombin Time 24.7 (H) 11.4 - 15.2 seconds   INR 2.3 (H) 0.8 - 1.2    Comment: (NOTE) INR goal varies based on device and disease states. Performed at [facility], [place] [address], [place], [place] [address]   Lactic acid, plasma     Status: None   Collection Time: 10/22/18  7:35 PM  Result Value Ref Range   Lactic Acid, Venous 1.7 0.5 - 1.9 mmol/L    Comment: Performed at [facility] [address], [place], [place] [address]  CBC with Differential     Status: Abnormal   Collection Time: 10/22/18  7:35 PM  Result Value Ref  Range   WBC 11.1 (H) 4.0 - 10.5 K/uL   RBC 4.38 3.87 - 5.11 MIL/uL   Hemoglobin 13.1 12.0 - 15.0 g/dL   HCT 40.3 36.0 - 46.0 %   MCV 92.0 80.0 - 100.0 fL   MCH 29.9 26.0 - 34.0 pg  MCHC 32.5 30.0 - 36.0 g/dL   RDW 14.4 11.5 - 15.5 %   Platelets 303 150 - 400 K/uL   nRBC 0.0 0.0 - 0.2 %   Neutrophils Relative % 86 %   Neutro Abs 9.4 (H) 1.7 - 7.7 K/uL   Lymphocytes Relative 8 %   Lymphs Abs 0.9 0.7 - 4.0 K/uL   Monocytes Relative 6 %   Monocytes Absolute 0.7 0.1 - 1.0 K/uL   Eosinophils Relative 0 %   Eosinophils Absolute 0.0 0.0 - 0.5 K/uL   Basophils Relative 0 %   Basophils Absolute 0.0 0.0 - 0.1 K/uL   Immature Granulocytes 0 %   Abs Immature Granulocytes 0.04 0.00 - 0.07 K/uL    Comment: Performed at [facility] [address], [place], [place] [address]  Lithium level     Status: None   Collection Time: 10/22/18  9:00 PM  Result Value Ref Range   Lithium Lvl 1.18 0.60 - 1.20 mmol/L    Comment: Performed at [facility] [address] [place], [place] [address]  SARS CORONAVIRUS 2 (TAT 6-12 HRS) Nasal Swab Aptima Multi Swab     Status: None   Collection Time: 10/22/18 10:35 PM   Specimen: Aptima Multi Swab; Nasal Swab  Result Value Ref Range   SARS Coronavirus 2 NEGATIVE NEGATIVE    Comment: (NOTE) SARS-CoV-2 target nucleic acids are NOT DETECTED. The SARS-CoV-2 RNA is generally detectable in upper and lower respiratory specimens during the acute phase of infection. Negative results do not preclude SARS-CoV-2 infection, do not rule out co-infections with other pathogens, and should not be used as the sole basis for treatment or other patient management decisions. Negative results must be combined with clinical observations, patient history, and epidemiological information. The expected result is Negative. Fact Sheet for Patients: SugarRoll.be Fact Sheet for Healthcare  Providers: https://www.woods-mathews.com/ This test is not yet approved or cleared by the [place] FDA and  has been authorized for detection and/or diagnosis of SARS-CoV-2 by FDA under an Emergency Use Authorization (EUA). This EUA will remain  in effect (meaning this test can be used) for the duration of the COVID-19 declaration under Section 56 4(b)(1) of the Act, 21 U.S.C. section 360bbb-3(b)(1), unless the authorization is terminated or revoked sooner. Performed at [facility], [place] [address] [place], [place] [address]   TSH     Status: None   Collection Time: 10/23/18  4:14 AM  Result Value Ref Range   TSH 4.237 0.350 - 4.500 uIU/mL    Comment: Performed by a 3rd Generation assay with a functional sensitivity of <=0.01 uIU/mL. Performed at [facility], [place] [address] [place], [place] [address]   Ammonia     Status: None   Collection Time: 10/23/18  4:14 AM  Result Value Ref Range   Ammonia 17 9 - 35 umol/L    Comment: Performed at [facility], [place] [address] [place], [place] [address]  CK     Status: None   Collection Time: 10/23/18  4:14 AM  Result Value Ref Range   Total CK 54 38 - 234 U/L    Comment: Performed at [facility], [place] [address] [place], [place] [address]  Vitamin B12     Status: None   Collection Time: 10/23/18  4:14 AM  Result Value Ref Range   Vitamin B-12 312 180 - 914 pg/mL    Comment: (NOTE) This assay  is not validated for testing neonatal or myeloproliferative syndrome specimens for Vitamin B12 levels. Performed at [facility], [place] [address], [place], [place] [address]   Protime-INR     Status: Abnormal   Collection Time: 10/23/18  4:16 AM  Result Value Ref Range   Prothrombin Time 24.7 (H) 11.4 - 15.2 seconds   INR 2.3 (H) 0.8 - 1.2    Comment: (NOTE) INR goal varies based on device and disease states. Performed at [facility], [place] [address], [place], [place] [address]    Hemoglobin A1c     Status: Abnormal   Collection Time: 10/23/18  4:16 AM  Result Value Ref Range   Hgb A1c MFr Bld 6.0 (H) 4.8 - 5.6 %    Comment: (NOTE) Pre diabetes:          5.7%-6.4% Diabetes:              >6.4% Glycemic control for   <7.0% adults with diabetes    Mean Plasma Glucose 125.5 mg/dL    Comment: Performed at [facility] [address] [place], [place] [address]  Lipid panel     Status: Abnormal   Collection Time: 10/23/18  4:16 AM  Result Value Ref Range   Cholesterol 212 (H) 0 - 200 mg/dL   Triglycerides 103 <150 mg/dL   HDL 94 >40 mg/dL   Total CHOL/HDL Ratio 2.3 RATIO   VLDL 21 0 - 40 mg/dL   LDL Cholesterol 97 0 - 99 mg/dL    Comment:        Total Cholesterol/HDL:CHD Risk Coronary Heart Disease Risk Table                     Men   Women  1/2 Average Risk   3.4   3.3  Average Risk       5.0   4.4  2 X Average Risk   9.6   7.1  3 X Average Risk  23.4   11.0        Use the calculated Patient Ratio above and the CHD Risk Table to determine the patient's CHD Risk.        ATP III CLASSIFICATION (LDL):  <100     mg/dL   Optimal  100-129  mg/dL   Near or Above                    Optimal  130-159  mg/dL   Borderline  160-189  mg/dL   High  >190     mg/dL   Very High Performed at [facility] [address], [place], [place] [address]    Ct Head Wo Contrast  Result Date: 10/22/2018 CLINICAL DATA:  Altered mental status EXAM: CT HEAD WITHOUT CONTRAST TECHNIQUE: Contiguous axial images were obtained from the base of the skull through the vertex without intravenous contrast. COMPARISON:  MRI 10/20/2018 FINDINGS: Brain: Low-density in the left cerebellar hemisphere compatible with infarct. This was not present on prior MRI. No hemorrhage. No additional acute infarction. No hydrocephalus. Vascular: No hyperdense vessel or unexpected calcification. Skull: No acute calvarial abnormality. Sinuses/Orbits: Visualized paranasal sinuses and mastoids  clear. Orbital soft tissues unremarkable. 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 Electronically Signed   By: Rolm Baptise M.D.   On: 10/22/2018 21:29   Dg Chest Portable 1 View  Result Date: 10/22/2018 CLINICAL DATA:  Confusion.  Sepsis.  EXAM: PORTABLE CHEST 1 VIEW COMPARISON:  Chest x-ray dated March 22, 2018. FINDINGS: Patient is rotated to the right. The heart size and mediastinal contours are within normal limits. Normal pulmonary vascularity. No focal consolidation, pleural effusion, or pneumothorax. Unchanged elevation of the right hemidiaphragm. No acute osseous abnormality. Prior thoracic spine posterior fusion again noted. IMPRESSION: No active disease. Electronically Signed   By: Titus Dubin M.D.   On: 10/22/2018 20:21    Pending Labs Unresulted Labs (From admission, onward)    Start     Ordered   10/23/18 0500  Protime-INR  Daily,   R     10/22/18 2322   10/23/18 0048  Lamotrigine level  Once,   STAT     10/23/18 0047   10/23/18 0041  Folate RBC  Once,   STAT     10/23/18 0040   10/23/18 0041  HIV Antibody (routine testing w rflx)  Once,   STAT     10/23/18 0040   10/23/18 0040  RPR  Once,   STAT     10/23/18 0040   10/22/18 1924  Urinalysis, Routine w reflex microscopic  Once,   STAT     10/22/18 1923          Vitals/Pain Today's Vitals   10/23/18 0200 10/23/18 0300 10/23/18 0330 10/23/18 0400  BP: (!) 167/77 (!) 165/87  (!) 168/84  Pulse: 80 79  84  Resp: (!) 21 18  17   Temp:      TempSrc:      SpO2: 97% 98%  97%  PainSc:   0-No pain     Isolation Precautions No active isolations  Medications Medications  sodium chloride flush (NS) 0.9 % injection 3 mL (3 mLs Intravenous Not Given 10/23/18 0348)   stroke: mapping our early stages of recovery book (has no administration in time range)  0.9 %  sodium chloride infusion ( Intravenous Hold 10/23/18 0512)  acetaminophen (TYLENOL) tablet 650 mg (has no  administration in time range)    Or  acetaminophen (TYLENOL) solution 650 mg (has no administration in time range)    Or  acetaminophen (TYLENOL) suppository 650 mg (has no administration in time range)  senna-docusate (Senokot-S) tablet 1 tablet (has no administration in time range)    Mobility non-ambulatory High fall risk   Focused Assessments Cardiac Assessment Handoff:  Cardiac Rhythm: Normal sinus rhythm Lab Results  Component Value Date   CKTOTAL 54 10/23/2018   CKMB 6.5 (H) 11/24/2017   No results found for: DDIMER Does the Patient currently have chest pain? No  , Neuro Assessment Handoff:  Swallow screen pass? No  Cardiac Rhythm: Normal sinus rhythm NIH Stroke Scale ( + Modified Stroke Scale Criteria)  Interval: Initial Level of Consciousness (1a.)   : Alert, keenly responsive LOC Questions (1b. )   +: Answers neither question correctly LOC Commands (1c. )   + : Performs both tasks correctly Best Gaze (2. )  +: Normal Visual (3. )  +: No visual loss Facial Palsy (4. )    : Normal symmetrical movements Motor Arm, Left (5a. )   +: No drift Motor Arm, Right (5b. )   +: No drift Motor Leg, Left (6a. )   +: No effort against gravity Motor Leg, Right (6b. )   +: No effort against gravity Limb Ataxia (7. ): Absent Sensory (8. )   +: Normal, no sensory loss Best Language (9. )   +: Mute, global aphasia Dysarthria (10. ):  Normal Extinction/Inattention (11.)   +: No Abnormality Modified SS Total  +: 11 Complete NIHSS TOTAL: 9 Last date known well: 10/20/18   Neuro Assessment: Exceptions to [facility] Neuro Checks:   Initial (10/22/18 1923)  Last Documented NIHSS Modified Score: 11 (10/23/18 0330) Has TPA been given? No If patient is a Neuro Trauma and patient is going to OR before floor call report to [place] nurse: [phone] or [phone]     R Recommendations: See Admitting Provider Note  Report given to:   Additional Notes:  Pt seen here on 08/23 for  weakness and d/c'd. Pt got worse 2 days ago per family. Pt can only say yes/no, but answer does not correlate with question. Able to follow commands but response is delayed. Total care now, and will probably need nursing home placement.

## 2018-10-23 NOTE — Telephone Encounter (Signed)
Patient had been without lithium for at least 2 days per her brother.  Her level was still 1.18 making it likely that the patient had been lithium toxic.  However also the patient had CT evidence of his recent stroke.  Please Call  brother and tell him not to give the patient lithium for now until her mental status clears.  It is uncertain how much of her confusion is related to the stroke versus the previous high lithium level.  The lithium level at this time should not be causing significant cognitive problems.  The emergency room note says the patient was going to be admitted to neurology because of the stroke but then mentions discharge.  Uncertain what the meaning of this as to whether the patient was discharged from the ER in 2 inpatient admission at neurology but there is no admit note for that.  The patient may be back at home.

## 2018-10-23 NOTE — Progress Notes (Signed)
EEG completed, results pending. 

## 2018-10-23 NOTE — Progress Notes (Addendum)
Reason for consult: ams  Subjective: Patient had an episode of emesis while I was in the room.  She answered yes and no to questions which was sometimes an appropriate response but was not able to provide any further history.  Her twin brother in the room states he is not sure if patient is taking her medications correctly.  He also states that he spoke with patient's psychiatrist and was told to hold off her lithium as her blood levels are on the higher side of normal.  Her brother also told me that she has bipolar disorder and occasionally has episodes where she is either hyper or confused but he has never seen her confused like she is today.  He denies any other concerns.   ROS: Unable to obtain due to poor mental status  Examination  Vital signs in last 24 hours: Temp:  [99.2 F (37.3 C)] 99.2 F (37.3 C) (08/26 1911) Pulse Rate:  [75-95] 77 (08/27 0945) Resp:  [14-25] 18 (08/27 0945) BP: (112-168)/(74-95) 166/77 (08/27 0945) SpO2:  [95 %-100 %] 96 % (08/27 0945)  General: lying in bed, has full body tremor like movement worsening right foot. CVS: pulse-normal rate and rhythm, hypertensive RS: breathing rapidly Extremities: normal, mild pedal edema  Neuro: MS: Alert, oriented to self only, follows simple one-step commands like squeezing fingers intermittently CN: pupils equal and reactive,  EOMI, face symmetric, tongue midline, normal sensation over face, Motor: Diffuse hypertonia with antigravity strength in all 4 extremities Reflexes: 3+ bilaterally over patella, biceps, plantars: Unable to assess as patient is very ticklish and keeps withdrawing her feet Coordination: Unable to assess as patient was not able to follow complex command Gait: not tested  Basic Metabolic Panel: Recent Labs  Lab 10/19/18 2036 10/22/18 1935  NA 136 140  K 3.9 4.6  CL 103 108  CO2 23 19*  GLUCOSE 105* 143*  BUN 18 23  CREATININE 1.32* 1.25*  CALCIUM 9.7 10.2    CBC: Recent Labs  Lab  10/19/18 2036 10/22/18 1935  WBC 9.8 11.1*  NEUTROABS 7.1 9.4*  HGB 12.2 13.1  HCT 36.9 40.3  MCV 90.4 92.0  PLT 297 303     Coagulation Studies: Recent Labs    10/22/18 1935 10/23/18 0416  LABPROT 24.7* 24.7*  INR 2.3* 2.3*   Urine analysis: Bland Ammonia: Normal TSH: Normal Lithium level: Normal 123456: Normal Salicylate level: Less than 7 Serum creatinine: 1.25 (was normal 7 months ago)   Imaging CT head 10/22/2018: Since recent MRI, acute infarct in the left cerebellar hemisphere. MR Brain wo contrast 10/23/2018:  No acute finding. Left cerebellar low-density on prior head CT was artifactual.  ASSESSMENT AND PLAN Ms. Asebedo is a 68 year old female with history of bipolar disorder on multiple medications at home who presented with altered mental status.  Initial CT head was concerning for cerebellar stroke.  However MRI brain did not show any acute abnormality. Routine EEG suggestive of mild encephalopathy, no epileptiform discharges. Differential diagnosis at this point include toxic metabolic encephalopathy, lithium toxicity, iatrogenic secondary to medication use, possible serotonin syndrome.    Encephalopathy, unspecified -No leukocytosis, normal electrolytes, normal ammonia and TSH.  Patient does have mild AKI.  She is on multiple medications at home and may have accidentally taken them incorrectly which in the setting of AKI could have led to serotonin syndrome-like presentation - Her psychiatrist was also concerned about lithium toxicity  Recommendations -Will hold off on all serotonergic drugs and lithium at this point -  Will do a trial of IV Ativan 2mg  for suspected serotonin syndrome -Seizure precautions -PRN IV Ativan 2 mg for generalized tonic-clonic seizure lasting more than 3 minutes -Correction of all toxic-metabolic abnormalities  Thank you for allowing [place] to participate in the care of this patient. I have spent a total of 35  minutes with the patient and  her twin brother reviewing hospital notes,  test results, labs and examining the patient as well as establishing an assessment and plan that was discussed personally with the patient/ her brother and her primary care team. I also discussed patient's care with her psychiatrist.   > 50% of time was spent in direct patient care.

## 2018-10-23 NOTE — Progress Notes (Signed)
ANTICOAGULATION CONSULT NOTE - Initial Consult  Pharmacy Consult for warfarin Indication: atrial fibrillation  Vital Signs: BP: 175/78 (08/27 1100) Pulse Rate: 73 (08/27 1100)  Labs: Recent Labs    10/22/18 1935 10/23/18 0414 10/23/18 0416  HGB 13.1  --   --   HCT 40.3  --   --   PLT 303  --   --   LABPROT 24.7*  --  24.7*  INR 2.3*  --  2.3*  CREATININE 1.25*  --   --   CKTOTAL  --  54  --     Estimated Creatinine Clearance: 39.9 mL/min (A) (by C-G formula based on SCr of 1.25 mg/dL (H)).   Medical History: Past Medical History:  Diagnosis Date  . Anxiety   . Atrial fibrillation ([place])   . Bipolar 2 disorder ([place])   . Depression   . History of cardioversion    x2  . Hyperlipidemia   . Hypothyroidism   . Migraine headache   . Osteoporosis     Assessment: 44 yof presented to the ED with AMS. She is on chronic warfarin for history of afib. INR is therapeutic at 2.3. CBC is WNL and no bleeding noted.   PTA dose: 2.5mg  daily except 5mg  on Fridays  Goal of Therapy:  INR 2-3 Monitor platelets by anticoagulation protocol: Yes   Plan:  Warfarin 2.5mg  PO x 1 tonight Daily INR  Rumbarger, Rande Lawman 10/23/2018,11:16 AM

## 2018-10-23 NOTE — ED Notes (Signed)
Patient transported to MRI 

## 2018-10-23 NOTE — Telephone Encounter (Signed)
I have spoken to the neurologist taking care of her.  We both agree that she likely had lithium toxicity and it should be expected to clear shortly.  The inpatient neurologist will take care of any remaining matters.  The patient will stay off of lithium for now.

## 2018-10-23 NOTE — Procedures (Signed)
Patient Name: Meredith [name]  MRN: [id number]  Epilepsy Attending: Lora Havens  Referring Physician/Provider: Dr Amie [place] Date: 10/23/2018 Duration: 25.09 mins  Patient history: 68 year old female with history of bipolar disorder who presented with altered mental status.  EEG to evaluate for seizures.  Level of alertness: Lethargic  AEDs during EEG study: None  Technical aspects: This EEG study was done with scalp electrodes positioned according to the 10-20 International system of electrode placement. Electrical activity was acquired at a sampling rate of 500Hz  and reviewed with a high frequency filter of 70Hz  and a low frequency filter of 1Hz . EEG data were recorded continuously and digitally stored.   DESCRIPTION:  The posterior dominant rhythm consists of 7-8 Hz activity of moderate voltage (25-35 uV) seen predominantly in posterior head regions, symmetric and reactive to eye opening and eye closing.  There was also continuous generalized 5 to 6 Hz theta slowing, maximal in left temporal region. Hyperventilation and photic stimulation were not performed.  IMPRESSION: This study is suggestive of cortical dysfunction in the left temporal region as well as mild diffuse encephalopathy. No seizures or epileptiform discharges were seen throughout the recording.  Priyanka Barbra Sarks

## 2018-10-23 NOTE — Progress Notes (Addendum)
[facility] PROGRESS NOTE  Meredith [name] [id number] DOB: December 29, 1950 DOA: 10/22/2018 PCP: Kristen Loader, FNP  Assessment/Plan:  1. stroke like symptoms/acute encephalopathy.  Presented with expressive aphasia, recurrent falls, and CT concerning for stroke but MRI revealed no acute infarct noting ct findings artifact. Evaluated by  Neurology who recommend stroke workup and EEg. HgA1c 6.0, cholesterol 212. Reportedly has not taken meds for several days. Lithium level 1.18. no s/sx infection. No metabolic derangement. Ammonia, TSH, CK all within limits of normal. Vit B12 within limits of normal. Folate pending. Evaluated by neuro who are concerned serotonin syndrome.  -continue neuro checks, - NPO pending swallow screen - PT/OT/SLP consultations  -EEG -hold lithium for now -will resume home meds as able.   2. Paroxysmal atrial fibrillation  In sinus rhythm on admission. Home meds include coumadin and metoprolol.  CHADS-VASc is at least 4 (CVA x2, age, gender). INR is therapeutic at 2.3 on admission  - NPO pending swallow screen   -coumadin per pharmacy  4. Mild renal insufficiency  SCr is 1.25 on admission, was 1.32 three days ago but <1 prior to that. Likely related to decreased oral intake. -hold nephrotoxins -monitor urine output -gentle iv fluids -recheck in am.    5. Hypothyroidism TSH within limits of normal.  - continue Synthroid    6. Bipolar disorder reportedly has not been compliant with meds. Is mumbling, appears anxious.  -will resume home meds    7. Chronic pain  - hold sedating med   8. Right calf swelling and tenderness  Right calf is larger than left and tender on admission  ? DVT, INR is therapeutic -follow venous dopplar  Code Status: full Family Communication: brother updated Disposition Plan: home when ready   Consultants:  [facility]  Procedures:    Antibiotics:    HPI/Subjective: Lying in bed intermittent moaning. Awake  alert complains of pain at slightest touch  Objective: Vitals:   10/23/18 0900 10/23/18 0945  BP: (!) 166/77 (!) 166/77  Pulse: 80 77  Resp: (!) 24 18  Temp:    SpO2: 95% 96%   No intake or output data in the 24 hours ending 10/23/18 1045 There were no vitals filed for this visit.  Exam:   General:  Awake anxious generalized tremors no acute distress  Cardiovascular: rrr no mgr no LE edema  Respiratory: normal effort BS clear bilaterally  Abdomen: obese soft +BS no guarding or rebounding  Musculoskeletal: joints without swelling/erythema   Data Reviewed: Basic Metabolic Panel: Recent Labs  Lab 10/19/18 2036 10/22/18 1935  NA 136 140  K 3.9 4.6  CL 103 108  CO2 23 19*  GLUCOSE 105* 143*  BUN 18 23  CREATININE 1.32* 1.25*  CALCIUM 9.7 10.2   Liver Function Tests: Recent Labs  Lab 10/19/18 2036 10/22/18 1935  AST 20 17  ALT 14 14  ALKPHOS 73 78  BILITOT 0.4 0.6  PROT 7.2 7.1  ALBUMIN 4.3 4.6   No results for input(s): LIPASE, AMYLASE in the last 168 hours. Recent Labs  Lab 10/23/18 0414  AMMONIA 17   CBC: Recent Labs  Lab 10/19/18 2036 10/22/18 1935  WBC 9.8 11.1*  NEUTROABS 7.1 9.4*  HGB 12.2 13.1  HCT 36.9 40.3  MCV 90.4 92.0  PLT 297 303   Cardiac Enzymes: Recent Labs  Lab 10/20/18 0126 10/23/18 0414  CKTOTAL 156 54   BNP (last 3 results) No results for input(s): BNP in the last 8760 hours.  ProBNP (  last 3 results) No results for input(s): PROBNP in the last 8760 hours.  CBG: Recent Labs  Lab 10/22/18 1916  GLUCAP 138*    Recent Results (from the past 240 hour(s))  SARS CORONAVIRUS 2 (TAT 6-12 HRS) Nasal Swab Aptima Multi Swab     Status: None   Collection Time: 10/22/18 10:35 PM   Specimen: Aptima Multi Swab; Nasal Swab  Result Value Ref Range Status   SARS Coronavirus 2 NEGATIVE NEGATIVE Final    Comment: (NOTE) SARS-CoV-2 target nucleic acids are NOT DETECTED. The SARS-CoV-2 RNA is generally detectable in upper and  lower respiratory specimens during the acute phase of infection. Negative results do not preclude SARS-CoV-2 infection, do not rule out co-infections with other pathogens, and should not be used as the sole basis for treatment or other patient management decisions. Negative results must be combined with clinical observations, patient history, and epidemiological information. The expected result is Negative. Fact Sheet for Patients: SugarRoll.be Fact Sheet for Healthcare Providers: https://www.woods-mathews.com/ This test is not yet approved or cleared by the [place] FDA and  has been authorized for detection and/or diagnosis of SARS-CoV-2 by FDA under an Emergency Use Authorization (EUA). This EUA will remain  in effect (meaning this test can be used) for the duration of the COVID-19 declaration under Section 56 4(b)(1) of the Act, 21 U.S.C. section 360bbb-3(b)(1), unless the authorization is terminated or revoked sooner. Performed at [facility], [place] [address], [place], [place] [address]      Studies: Ct Head Wo Contrast  Result Date: 10/22/2018 CLINICAL DATA:  Altered mental status EXAM: CT HEAD WITHOUT CONTRAST TECHNIQUE: Contiguous axial images were obtained from the base of the skull through the vertex without intravenous contrast. COMPARISON:  MRI 10/20/2018 FINDINGS: Brain: Low-density in the left cerebellar hemisphere compatible with infarct. This was not present on prior MRI. No hemorrhage. No additional acute infarction. No hydrocephalus. Vascular: No hyperdense vessel or unexpected calcification. Skull: No acute calvarial abnormality. Sinuses/Orbits: Visualized paranasal sinuses and mastoids clear. Orbital soft tissues unremarkable. 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 Electronically Signed   By: Rolm Baptise M.D.   On: 10/22/2018 21:29   Mr Brain Wo Contrast  Result  Date: 10/23/2018 CLINICAL DATA:  Stroke follow-up EXAM: MRI HEAD WITHOUT CONTRAST TECHNIQUE: Multiplanar, multiecho pulse sequences of the brain and surrounding structures were obtained without intravenous contrast. COMPARISON:  Brain MRI 3 days ago and head CT from yesterday FINDINGS: Brain: No acute infarction, hemorrhage, hydrocephalus, extra-axial collection or mass lesion. Mild central predominant atrophy. Vascular: Major flow voids are preserved Skull and upper cervical spine: Negative for marrow lesion Sinuses/Orbits: Negative IMPRESSION: No acute finding. Left cerebellar low-density on prior head CT was artifactual. Electronically Signed   By: Monte Fantasia M.D.   On: 10/23/2018 07:07   Dg Chest Portable 1 View  Result Date: 10/22/2018 CLINICAL DATA:  Confusion.  Sepsis. EXAM: PORTABLE CHEST 1 VIEW COMPARISON:  Chest x-ray dated March 22, 2018. FINDINGS: Patient is rotated to the right. The heart size and mediastinal contours are within normal limits. Normal pulmonary vascularity. No focal consolidation, pleural effusion, or pneumothorax. Unchanged elevation of the right hemidiaphragm. No acute osseous abnormality. Prior thoracic spine posterior fusion again noted. IMPRESSION: No active disease. Electronically Signed   By: Titus Dubin M.D.   On: 10/22/2018 20:21    Scheduled Meds: .  stroke: mapping our early stages of recovery book   Does not apply  Once  . sodium chloride flush  3 mL Intravenous Once   Continuous Infusions: . sodium chloride Stopped (10/23/18 0512)    Principal Problem:   Acute encephalopathy Active Problems:   PAF (paroxysmal atrial fibrillation) (HCC)   Renal insufficiency   Bipolar disorder, curr episode mixed, severe, with psychotic features (HCC)   Chronic pain syndrome   Anxiety   Hypothyroidism   Pain and swelling of right lower leg    Time spent: 45 minutes    [name] NP  [facility]  If 7PM-7AM, please contact night-coverage at  [email or web address], password [facility] 10/23/2018, 10:45 AM  LOS: 0 days

## 2018-10-23 NOTE — Plan of Care (Signed)
Patient stable, patient unable to participate in POC d/t mentation, discussed POC with patient's spouse at bedside, agreeable with plan, denies question/concerns at this time.

## 2018-10-23 NOTE — Progress Notes (Signed)
PT Cancellation Note  Patient Details Name: Meredith [name] MRN: [id number] DOB: 11-03-1950   Cancelled Treatment:    Reason Eval/Treat Not Completed: Patient's level of consciousness; patient just s/p Ativan and unable to participate in PT.  Will attempt again another day.   Reginia Naas 10/23/2018, 3:59 PM  Magda Kiel, [place] [phone] 10/23/2018

## 2018-10-23 NOTE — ED Notes (Signed)
Patient has been incontinent of urine multiple times. Pt cleaned, bed cleaned, monitoring equipment lines cleaned and linens changed. PurWick applied to obtain urine specimen.

## 2018-10-23 NOTE — ED Notes (Signed)
ED TO INPATIENT HANDOFF REPORT  ED Nurse Name and Phone #:  Nigel Mormon [phone]  S Name/Age/Gender Meredith [name] 68 y.o. female Room/Bed: RESUSC/RESUSC  Code Status   Code Status: Full Code  Home/SNF/Other Home Patient oriented to: self Is this baseline? No   Triage Complete: Triage complete  Chief Complaint AMS  Triage Note Pt arrived [facility] from home where she lives with her partner and her brother. They report that the patient has been progressively altered starting 2 days ago. Pt currently A&Ox1. Pt was seen 8/23 for AKI and weakness. EMS reports hx of recent falls, lithium use, and use of coumadin however family reports she has not taken her meds in several days. VS BP192/104 HR106 O2 97% RA RR 18 CBG 124 T 98.1   Allergies Allergies  Allergen Reactions  . Codeine Anaphylaxis    Patient states she can take codeine but not percocet   . Adhesive [Tape]     blister  . Celebrex [Celecoxib] Hives  . Sulfa Antibiotics Hives  . Tricyclic Antidepressants     Doesn't help  . Amoxicillin Rash  . Ampicillin Rash  . Cephalexin Rash  . Penicillins Rash    Did it involve swelling of the face/tongue/throat, SOB, or low BP? Yes Did it involve sudden or severe rash/hives, skin peeling, or any reaction on the inside of your mouth or nose? NO Did you need to seek medical attention at a hospital or doctor's office? NO When did it last happen? childhood If all above answers are "NO", may proceed with cephalosporin use.    Level of Care/Admitting Diagnosis ED Disposition    ED Disposition Condition [facility] Area: [place] [100100]  Level of Care: Telemetry Medical [104]  I expect the patient will be discharged within 24 hours: Yes  LOW acuity---Tx typically complete <24 hrs---ACUTE conditions typically can be evaluated <24 hours---LABS likely to return to acceptable levels <24 hours---IS near functional baseline---EXPECTED to  return to current living arrangement---NOT newly hypoxic: Does not meet criteria for 5C-Observation unit  Covid Evaluation: Asymptomatic Screening Protocol (No Symptoms)  Diagnosis: [id number]  Admitting Physician: Vianne Bulls [id number]  Attending Physician: Vianne Bulls [id number]  PT Class (Do Not Modify): Observation [104]  PT Acc Code (Do Not Modify): Observation [10022]       B Medical/Surgery History Past Medical History:  Diagnosis Date  . Anxiety   . Atrial fibrillation ([place])   . Bipolar 2 disorder ([place])   . Depression   . History of cardioversion    x2  . Hyperlipidemia   . Hypothyroidism   . Migraine headache   . Osteoporosis    Past Surgical History:  Procedure Laterality Date  . ABLATION    . BACK SURGERY  2014  . CARDIOVERSION  12/2013, 01/2014   x2  . KNEE SURGERY     ORTHOSCOPIC  . LUNG REMOVAL, PARTIAL Right    BENIGN LUNG CYST  . THORACIC SPINE SURGERY Right   . THUMB ARTHROSCOPY       A IV Location/Drains/Wounds Patient Lines/Drains/Airways Status   Active Line/Drains/Airways    Name:   Placement date:   Placement time:   Site:   Days:   Peripheral IV 10/22/18 Right Hand   10/22/18    2302    Hand   1          Intake/Output Last 24 hours No intake or output data in  the 24 hours ending 10/23/18 0856  Labs/Imaging Results for orders placed or performed during the hospital encounter of 10/22/18 (from the past 48 hour(s))  CBG monitoring, ED     Status: Abnormal   Collection Time: 10/22/18  7:16 PM  Result Value Ref Range   Glucose-Capillary 138 (H) 70 - 99 mg/dL  Acetaminophen level     Status: Abnormal   Collection Time: 10/22/18  7:33 PM  Result Value Ref Range   Acetaminophen (Tylenol), Serum <10 (L) 10 - 30 ug/mL    Comment: (NOTE) Therapeutic concentrations vary significantly. A range of 10-30 ug/mL  may be an effective concentration for many patients. However, some  are best treated at  concentrations outside of this range. Acetaminophen concentrations >150 ug/mL at 4 hours after ingestion  and >50 ug/mL at 12 hours after ingestion are often associated with  toxic reactions. Performed at [facility], [place] [address] [place], [place] [id number]   Salicylate level     Status: None   Collection Time: 10/22/18  7:33 PM  Result Value Ref Range   Salicylate Lvl [id number] 2.8 - 30.0 mg/dL    Comment: Performed at [facility] [address], [place], [place] [address]  Comprehensive metabolic panel     Status: Abnormal   Collection Time: 10/22/18  7:35 PM  Result Value Ref Range   Sodium 140 135 - 145 mmol/L   Potassium 4.6 3.5 - 5.1 mmol/L   Chloride 108 98 - 111 mmol/L   CO2 19 (L) 22 - 32 mmol/L   Glucose, Bld 143 (H) 70 - 99 mg/dL   BUN 23 8 - 23 mg/dL   Creatinine, Ser 1.25 (H) 0.44 - 1.00 mg/dL   Calcium 10.2 8.9 - 10.3 mg/dL   Total Protein 7.1 6.5 - 8.1 g/dL   Albumin 4.6 3.5 - 5.0 g/dL   AST 17 15 - 41 U/L   ALT 14 0 - 44 U/L   Alkaline Phosphatase 78 38 - 126 U/L   Total Bilirubin 0.6 0.3 - 1.2 mg/dL   GFR calc non Af Amer 44 (L) >60 mL/min   GFR calc Af Amer 51 (L) >60 mL/min   Anion gap 13 5 - 15    Comment: Performed at [facility] [address] [place], [place] [address]  Protime-INR - (order if patient is taking Coumadin / Warfarin)     Status: Abnormal   Collection Time: 10/22/18  7:35 PM  Result Value Ref Range   Prothrombin Time 24.7 (H) 11.4 - 15.2 seconds   INR 2.3 (H) 0.8 - 1.2    Comment: (NOTE) INR goal varies based on device and disease states. Performed at [facility], [place] [address], [place], [place] [address]   Lactic acid, plasma     Status: None   Collection Time: 10/22/18  7:35 PM  Result Value Ref Range   Lactic Acid, Venous 1.7 0.5 - 1.9 mmol/L    Comment: Performed at [facility] [address], [place],  [address]  CBC with Differential     Status: Abnormal   Collection Time: 10/22/18   7:35 PM  Result Value Ref Range   WBC 11.1 (H) 4.0 - 10.5 K/uL   RBC 4.38 3.87 - 5.11 MIL/uL   Hemoglobin 13.1 12.0 - 15.0 g/dL   HCT 40.3 36.0 - 46.0 %   MCV 92.0 80.0 - 100.0 fL   MCH 29.9 26.0 - 34.0  pg   MCHC 32.5 30.0 - 36.0 g/dL   RDW 14.4 11.5 - 15.5 %   Platelets 303 150 - 400 K/uL   nRBC 0.0 0.0 - 0.2 %   Neutrophils Relative % 86 %   Neutro Abs 9.4 (H) 1.7 - 7.7 K/uL   Lymphocytes Relative 8 %   Lymphs Abs 0.9 0.7 - 4.0 K/uL   Monocytes Relative 6 %   Monocytes Absolute 0.7 0.1 - 1.0 K/uL   Eosinophils Relative 0 %   Eosinophils Absolute 0.0 0.0 - 0.5 K/uL   Basophils Relative 0 %   Basophils Absolute 0.0 0.0 - 0.1 K/uL   Immature Granulocytes 0 %   Abs Immature Granulocytes 0.04 0.00 - 0.07 K/uL    Comment: Performed at [facility] [address], [place], [place] [address]  Lithium level     Status: None   Collection Time: 10/22/18  9:00 PM  Result Value Ref Range   Lithium Lvl 1.18 0.60 - 1.20 mmol/L    Comment: Performed at [facility] [address], [place], [place] [address]  SARS CORONAVIRUS 2 (TAT 6-12 HRS) Nasal Swab Aptima Multi Swab     Status: None   Collection Time: 10/22/18 10:35 PM   Specimen: Aptima Multi Swab; Nasal Swab  Result Value Ref Range   SARS Coronavirus 2 NEGATIVE NEGATIVE    Comment: (NOTE) SARS-CoV-2 target nucleic acids are NOT DETECTED. The SARS-CoV-2 RNA is generally detectable in upper and lower respiratory specimens during the acute phase of infection. Negative results do not preclude SARS-CoV-2 infection, do not rule out co-infections with other pathogens, and should not be used as the sole basis for treatment or other patient management decisions. Negative results must be combined with clinical observations, patient history, and epidemiological information. The expected result is Negative. Fact Sheet for Patients: SugarRoll.be Fact Sheet for Healthcare  Providers: https://www.woods-mathews.com/ This test is not yet approved or cleared by the [place] FDA and  has been authorized for detection and/or diagnosis of SARS-CoV-2 by FDA under an Emergency Use Authorization (EUA). This EUA will remain  in effect (meaning this test can be used) for the duration of the COVID-19 declaration under Section 56 4(b)(1) of the Act, 21 U.S.C. section 360bbb-3(b)(1), unless the authorization is terminated or revoked sooner. Performed at [facility], [place] [address], [place], [place] [address]   TSH     Status: None   Collection Time: 10/23/18  4:14 AM  Result Value Ref Range   TSH 4.237 0.350 - 4.500 uIU/mL    Comment: Performed by a 3rd Generation assay with a functional sensitivity of <=0.01 uIU/mL. Performed at [facility], [place] [address] [place], [place] [address]   Ammonia     Status: None   Collection Time: 10/23/18  4:14 AM  Result Value Ref Range   Ammonia 17 9 - 35 umol/L    Comment: Performed at [facility], [place] [address] [place], [place] [address]  CK     Status: None   Collection Time: 10/23/18  4:14 AM  Result Value Ref Range   Total CK 54 38 - 234 U/L    Comment: Performed at [facility], [place] [address], [place],  [address]  Vitamin B12     Status: None   Collection Time: 10/23/18  4:14 AM  Result Value Ref Range   Vitamin B-12 312 180 - 914 pg/mL    Comment: (  NOTE) This assay is not validated for testing neonatal or myeloproliferative syndrome specimens for Vitamin B12 levels. Performed at [facility], [place] [address], [place], [place] [address]   Protime-INR     Status: Abnormal   Collection Time: 10/23/18  4:16 AM  Result Value Ref Range   Prothrombin Time 24.7 (H) 11.4 - 15.2 seconds   INR 2.3 (H) 0.8 - 1.2    Comment: (NOTE) INR goal varies based on device and disease states. Performed at [facility], [place] [address] [place], [place] [address]    Hemoglobin A1c     Status: Abnormal   Collection Time: 10/23/18  4:16 AM  Result Value Ref Range   Hgb A1c MFr Bld 6.0 (H) 4.8 - 5.6 %    Comment: (NOTE) Pre diabetes:          5.7%-6.4% Diabetes:              >6.4% Glycemic control for   <7.0% adults with diabetes    Mean Plasma Glucose 125.5 mg/dL    Comment: Performed at [facility] [address], [place], [place] [address]  Lipid panel     Status: Abnormal   Collection Time: 10/23/18  4:16 AM  Result Value Ref Range   Cholesterol 212 (H) 0 - 200 mg/dL   Triglycerides 103 <150 mg/dL   HDL 94 >40 mg/dL   Total CHOL/HDL Ratio 2.3 RATIO   VLDL 21 0 - 40 mg/dL   LDL Cholesterol 97 0 - 99 mg/dL    Comment:        Total Cholesterol/HDL:CHD Risk Coronary Heart Disease Risk Table                     Men   Women  1/2 Average Risk   3.4   3.3  Average Risk       5.0   4.4  2 X Average Risk   9.6   7.1  3 X Average Risk  23.4   11.0        Use the calculated Patient Ratio above and the CHD Risk Table to determine the patient's CHD Risk.        ATP III CLASSIFICATION (LDL):  <100     mg/dL   Optimal  100-129  mg/dL   Near or Above                    Optimal  130-159  mg/dL   Borderline  160-189  mg/dL   High  >190     mg/dL   Very High Performed at [facility] [address] [place], [place] [address]   Urinalysis, Routine w reflex microscopic     Status: Abnormal   Collection Time: 10/23/18  6:00 AM  Result Value Ref Range   Color, Urine YELLOW YELLOW   APPearance CLEAR CLEAR   Specific Gravity, Urine 1.025 1.005 - 1.030   pH 6.5 5.0 - 8.0   Glucose, UA NEGATIVE NEGATIVE mg/dL   Hgb urine dipstick TRACE (A) NEGATIVE   Bilirubin Urine SMALL (A) NEGATIVE   Ketones, ur 15 (A) NEGATIVE mg/dL   Protein, ur 30 (A) NEGATIVE mg/dL   Nitrite NEGATIVE NEGATIVE   Leukocytes,Ua NEGATIVE NEGATIVE    Comment: Performed at [facility] [address], [place], [place] [address]  Urinalysis, Microscopic  (reflex)     Status: Abnormal   Collection Time: 10/23/18  6:00 AM  Result Value Ref Range   RBC / HPF 0-5 0 - 5 RBC/hpf   WBC, UA NONE SEEN 0 - 5 WBC/hpf    Comment: URINE MICROSCOPIC PERFORMED ON UNSPUN URINE.   Bacteria, UA FEW (A) NONE SEEN   Squamous Epithelial / LPF 0-5 0 - 5    Comment: Performed at [facility], [place] [address], [place],  [address]   Ct Head Wo Contrast  Result Date: 10/22/2018 CLINICAL DATA:  Altered mental status EXAM: CT HEAD WITHOUT CONTRAST TECHNIQUE: Contiguous axial images were obtained from the base of the skull through the vertex without intravenous contrast. COMPARISON:  MRI 10/20/2018 FINDINGS: Brain: Low-density in the left cerebellar hemisphere compatible with infarct. This was not present on prior MRI. No hemorrhage. No additional acute infarction. No hydrocephalus. Vascular: No hyperdense vessel or unexpected calcification. Skull: No acute calvarial abnormality. Sinuses/Orbits: Visualized paranasal sinuses and mastoids clear. Orbital soft tissues unremarkable. 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 Electronically Signed   By: Rolm Baptise M.D.   On: 10/22/2018 21:29   Mr Brain Wo Contrast  Result Date: 10/23/2018 CLINICAL DATA:  Stroke follow-up EXAM: MRI HEAD WITHOUT CONTRAST TECHNIQUE: Multiplanar, multiecho pulse sequences of the brain and surrounding structures were obtained without intravenous contrast. COMPARISON:  Brain MRI 3 days ago and head CT from yesterday FINDINGS: Brain: No acute infarction, hemorrhage, hydrocephalus, extra-axial collection or mass lesion. Mild central predominant atrophy. Vascular: Major flow voids are preserved Skull and upper cervical spine: Negative for marrow lesion Sinuses/Orbits: Negative IMPRESSION: No acute finding. Left cerebellar low-density on prior head CT was artifactual. Electronically Signed   By: Monte Fantasia M.D.   On: 10/23/2018 07:07   Dg  Chest Portable 1 View  Result Date: 10/22/2018 CLINICAL DATA:  Confusion.  Sepsis. EXAM: PORTABLE CHEST 1 VIEW COMPARISON:  Chest x-ray dated March 22, 2018. FINDINGS: Patient is rotated to the right. The heart size and mediastinal contours are within normal limits. Normal pulmonary vascularity. No focal consolidation, pleural effusion, or pneumothorax. Unchanged elevation of the right hemidiaphragm. No acute osseous abnormality. Prior thoracic spine posterior fusion again noted. IMPRESSION: No active disease. Electronically Signed   By: Titus Dubin M.D.   On: 10/22/2018 20:21    Pending Labs Unresulted Labs (From admission, onward)    Start     Ordered   10/23/18 0500  Protime-INR  Daily,   R     10/22/18 2322   10/23/18 0048  Lamotrigine level  Once,   STAT     10/23/18 0047   10/23/18 0041  Folate RBC  Once,   STAT     10/23/18 0040   10/23/18 0041  HIV Antibody (routine testing w rflx)  Once,   STAT     10/23/18 0040   10/23/18 0040  RPR  Once,   STAT     10/23/18 0040          Vitals/Pain Today's Vitals   10/23/18 0330 10/23/18 0400 10/23/18 0700 10/23/18 0800  BP:  (!) 168/84 (!) 156/83 (!) 158/80  Pulse:  84 82 79  Resp:  17 (!) 21 (!) 24  Temp:      TempSrc:      SpO2:  97% 96% 95%  PainSc: 0-No pain       Isolation Precautions No active isolations  Medications Medications  sodium chloride flush (NS) 0.9 % injection 3 mL (3 mLs Intravenous Not Given 10/23/18  0348)   stroke: mapping our early stages of recovery book (has no administration in time range)  0.9 %  sodium chloride infusion ( Intravenous Hold 10/23/18 0512)  acetaminophen (TYLENOL) tablet 650 mg (has no administration in time range)    Or  acetaminophen (TYLENOL) solution 650 mg (has no administration in time range)    Or  acetaminophen (TYLENOL) suppository 650 mg (has no administration in time range)  senna-docusate (Senokot-S) tablet 1 tablet (has no administration in time range)     Mobility non-ambulatory High fall risk   Focused Assessments Neuro Assessment Handoff:  Swallow screen pass? No  Cardiac Rhythm: Normal sinus rhythm NIH Stroke Scale ( + Modified Stroke Scale Criteria)  Interval: Initial Level of Consciousness (1a.)   : Alert, keenly responsive LOC Questions (1b. )   +: Answers neither question correctly LOC Commands (1c. )   + : Performs both tasks correctly Best Gaze (2. )  +: Normal Visual (3. )  +: No visual loss Facial Palsy (4. )    : Normal symmetrical movements Motor Arm, Left (5a. )   +: No drift Motor Arm, Right (5b. )   +: No drift Motor Leg, Left (6a. )   +: No effort against gravity(lack of effort) Motor Leg, Right (6b. )   +: No effort against gravity(lack of effort) Limb Ataxia (7. ): Absent Sensory (8. )   +: Normal, no sensory loss Best Language (9. )   +: Severe aphasia(patient only responds yes/no - does not answer most questions appropriately) Dysarthria (10. ): Normal Extinction/Inattention (11.)   +: No Abnormality Modified SS Total  +: 10 Complete NIHSS TOTAL: 9 Last date known well: 10/20/18   Neuro Assessment: Exceptions to [facility] Neuro Checks:   Initial (10/22/18 1923)  Last Documented NIHSS Modified Score: 10 (10/23/18 0730) Has TPA been given? No If patient is a Neuro Trauma and patient is going to OR before floor call report to [place] nurse: [phone] or [phone]     R Recommendations: See Admitting Provider Note  Report given to:   Additional Notes:  Patient is being worked up for stroke - CT scan last night showed acute L cerebellar infarct that was suggested to be artifactual according to MRI done this morning. Patient scored most recent modified NIH 10 - 2 questions, 3 for each leg, and 2 for expressive aphasia. She only answers yes/no to questions, though most of the time, her answer does not correlate. She moans occasionally, but when asked if in pain, she denies. History of Bipolar  disorder. Per notes made in chart, patient has not been consistent with medications and likely has not taken them the last three or so days. Family also reported that patient has been on a general decline for a couple of weeks, but became even worse 8/24 - not walking per her norm, not conversing as usual, and has become dependent in ADLs. Per NP, plan to EEG and continue to be followed by neuro.

## 2018-10-24 ENCOUNTER — Telehealth: Payer: Self-pay | Admitting: Psychiatry

## 2018-10-24 DIAGNOSIS — T56891S Toxic effect of other metals, accidental (unintentional), sequela: Secondary | ICD-10-CM

## 2018-10-24 LAB — BASIC METABOLIC PANEL
Anion gap: 11 (ref 5–15)
BUN: 22 mg/dL (ref 8–23)
CO2: 18 mmol/L — ABNORMAL LOW (ref 22–32)
Calcium: 9.4 mg/dL (ref 8.9–10.3)
Chloride: 115 mmol/L — ABNORMAL HIGH (ref 98–111)
Creatinine, Ser: 1.05 mg/dL — ABNORMAL HIGH (ref 0.44–1.00)
GFR calc Af Amer: >60 mL/min (ref >60)
GFR calc non Af Amer: 55 mL/min — ABNORMAL LOW (ref >60)
Glucose, Bld: 115 mg/dL — ABNORMAL HIGH (ref 70–99)
Potassium: 4 mmol/L (ref 3.5–5.1)
Sodium: 144 mmol/L (ref 135–145)

## 2018-10-24 LAB — FOLATE RBC
Folate, Hemolysate: 284 ng/mL
Folate, RBC: 789 ng/mL (ref >498)
Hematocrit: 36 % (ref 34.0–46.6)

## 2018-10-24 LAB — PROTIME-INR
INR: 2 — ABNORMAL HIGH (ref 0.8–1.2)
Prothrombin Time: 22.5 seconds — ABNORMAL HIGH (ref 11.4–15.2)

## 2018-10-24 LAB — LAMOTRIGINE LEVEL: Lamotrigine Lvl: 1.3 ug/mL — ABNORMAL LOW (ref 2.0–20.0)

## 2018-10-24 MED ORDER — CHLORHEXIDINE GLUCONATE 0.12 % MT SOLN
15.0000 mL | Freq: Two times a day (BID) | OROMUCOSAL | Status: DC
  Administered 2018-10-24 – 2018-10-28 (×9): 15 mL via OROMUCOSAL
  Filled 2018-10-24 (×8): qty 15

## 2018-10-24 MED ORDER — WARFARIN SODIUM 5 MG PO TABS: 5.0000 mg | ORAL_TABLET | Freq: Once | ORAL | Status: DC

## 2018-10-24 MED ORDER — ORAL CARE MOUTH RINSE
15.0000 mL | Freq: Two times a day (BID) | OROMUCOSAL | Status: DC
  Administered 2018-10-24 – 2018-10-28 (×8): 15 mL via OROMUCOSAL

## 2018-10-24 MED ORDER — HALOPERIDOL LACTATE 5 MG/ML IJ SOLN
2.0000 mg | Freq: Four times a day (QID) | INTRAMUSCULAR | Status: DC | PRN
  Administered 2018-10-24: 2 mg via INTRAVENOUS
  Filled 2018-10-24: qty 1

## 2018-10-24 NOTE — Progress Notes (Signed)
ANTICOAGULATION CONSULT NOTE - Initial Consult  Pharmacy Consult for warfarin Indication: atrial fibrillation  Vital Signs: Temp: 98.7 F (37.1 C) (08/28 0743) Temp Source: Oral (08/28 0743) BP: 153/92 (08/28 0743) Pulse Rate: 72 (08/28 0743)  Labs: Recent Labs    10/22/18 1935 10/23/18 0414 10/23/18 0416 10/24/18 0418  HGB 13.1  --   --   --   HCT 40.3  --   --   --   PLT 303  --   --   --   LABPROT 24.7*  --  24.7* 22.5*  INR 2.3*  --  2.3* 2.0*  CREATININE 1.25*  --   --  1.05*  CKTOTAL  --  54  --   --     Estimated Creatinine Clearance: 47.5 mL/min (A) (by C-G formula based on SCr of 1.05 mg/dL (H)).   Medical History: Past Medical History:  Diagnosis Date  . Anxiety   . Atrial fibrillation ([place])   . Bipolar 2 disorder ([place])   . Depression   . History of cardioversion    x2  . Hyperlipidemia   . Hypothyroidism   . Migraine headache   . Osteoporosis     Assessment: 47 yof presented to the ED with AMS. She is on chronic warfarin for history of afib. INR is therapeutic at 2. CBC is WNL and no bleeding noted. Patient did not receive dose on 8/27. Dose was charted as not given due to NPO  PTA dose: 2.5mg  daily except 5mg  on Fridays  Goal of Therapy:  INR 2-3 Monitor platelets by anticoagulation protocol: Yes   Plan:  Warfarin 5 mg PO x 1 tonight if patient able to take PO Daily INR  Dwayne A. Levada Dy, PharmD, BCPS, FNKF Clinical Pharmacist [place] Please utilize Amion for appropriate phone number to reach the unit pharmacist ([place])   10/24/2018,12:33 PM

## 2018-10-24 NOTE — Progress Notes (Signed)
Progress Note    [name]  [id number] DOB: 08/17/1950  DOA: 10/22/2018 PCP: Kristen Loader, FNP    Brief Narrative:     Medical records reviewed and are as summarized below:  Meredith [name] is an 68 y.o. female with medical history significant for bipolar disorder, atrial fibrillation on Coumadin, hyperlipidemia, hypothyroidism, and chronic migraine, presenting to the emergency department for evaluation of altered mental status.  Work up in process but appears mentation could be related to lithium toxicity.  Assessment/Plan:   Principal Problem:   Acute encephalopathy Active Problems:   PAF (paroxysmal atrial fibrillation) (HCC)   Bipolar disorder, curr episode mixed, severe, with psychotic features (HCC)   Chronic pain syndrome   Anxiety   Hypothyroidism   Acute ischemic stroke (HCC)   Renal insufficiency   Pain and swelling of right lower leg  1.acute encephalopathy due to lithium toxicity  -Presented with expressive aphasia, recurrent falls, and CT concerning for stroke but MRI revealed no acute infarct noting ct findings artifact -Reportedly has not taken meds for several days. -Ammonia, TSH, CK all within limits of normal. Vit B12 within limits of normal. -EEG did not show seizure like activity -per chart review: Dr. Clovis Pu: I have spoken to the neurologist taking care of her.  We both agree that she likely had lithium toxicity and it should be expected to clear shortly -PT/OT consult  2.Paroxysmal atrial fibrillationIn sinus rhythm on admission. Home meds include coumadin and metoprolol. CHADS-VASc is at least 4 (CVA x2, age, gender).INR is therapeutic at 2.3 on admission -coumadin per pharmacy once able to take PO  4.Mild renal insufficiencySCr is 1.25 on admission, was 1.32 three days ago but <1 prior to that. Likely related to decreased oral intake. -hold nephrotoxins -Cr improved  5.HypothyroidismTSH within limits of normal.  -  continue Synthroid- may need to change to IV if not taking in orals  6.Bipolar disorder -resume meds when able to take in PO safely  7.Chronic pain -hold sedating med (not taking PO)  8. Right calf swelling and tenderness   -duplex negative  Family Communication/Anticipated D/C date and plan/Code Status   DVT prophylaxis: Lovenox ordered. Code Status: Full Code.  Family Communication:  Disposition Plan: pending mental status improvement/ability to take PO/ PT/OT evaluation   Medical Consultants:    Neurology  Subjective:   Will awake and say a few words Denies pain  Objective:    Vitals:   10/23/18 2355 10/24/18 0338 10/24/18 0743 10/24/18 0800  BP: (!) 156/91 (!) 166/86 (!) 153/92   Pulse: 88 87 72   Resp: 18 18 20    Temp: 98.8 F (37.1 C) 99 F (37.2 C) 98.7 F (37.1 C)   TempSrc: Oral Oral Oral   SpO2: 96% 97% 98% 98%    Intake/Output Summary (Last 24 hours) at 10/24/2018 1240 Last data filed at 10/23/2018 2050 Gross per 24 hour  Intake 150 ml  Output 350 ml  Net -200 ml   There were no vitals filed for this visit.  Exam: In bed, laying on right side Opens eyes to voice Answers questions although not 100% appropriate rrr No increased work of breathing   Data Reviewed:   I have personally reviewed following labs and imaging studies:  Labs: Labs show the following:   Basic Metabolic Panel: Recent Labs  Lab 10/19/18 2036 10/22/18 1935 10/24/18 0418  NA 136 140 144  K 3.9 4.6 4.0  CL 103 108 115*  CO2  23 19* 18*  GLUCOSE 105* 143* 115*  BUN 18 23 22   CREATININE 1.32* 1.25* 1.05*  CALCIUM 9.7 10.2 9.4   GFR Estimated Creatinine Clearance: 47.5 mL/min (A) (by C-G formula based on SCr of 1.05 mg/dL (H)). Liver Function Tests: Recent Labs  Lab 10/19/18 2036 10/22/18 1935  AST 20 17  ALT 14 14  ALKPHOS 73 78  BILITOT 0.4 0.6  PROT 7.2 7.1  ALBUMIN 4.3 4.6   No results for input(s): LIPASE, AMYLASE in the last 168 hours.  Recent Labs  Lab 10/23/18 0414  AMMONIA 17   Coagulation profile Recent Labs  Lab 10/22/18 1935 10/23/18 0416 10/24/18 0418  INR 2.3* 2.3* 2.0*    CBC: Recent Labs  Lab 10/19/18 2036 10/22/18 1935  WBC 9.8 11.1*  NEUTROABS 7.1 9.4*  HGB 12.2 13.1  HCT 36.9 40.3  MCV 90.4 92.0  PLT 297 303   Cardiac Enzymes: Recent Labs  Lab 10/20/18 0126 10/23/18 0414  CKTOTAL 156 54   BNP (last 3 results) No results for input(s): PROBNP in the last 8760 hours. CBG: Recent Labs  Lab 10/22/18 1916  GLUCAP 138*   D-Dimer: No results for input(s): DDIMER in the last 72 hours. Hgb A1c: Recent Labs    10/23/18 0416  HGBA1C 6.0*   Lipid Profile: Recent Labs    10/23/18 0416  CHOL 212*  HDL 94  LDLCALC 97  TRIG 103  CHOLHDL 2.3   Thyroid function studies: Recent Labs    10/23/18 0414  TSH 4.237   Anemia work up: Recent Labs    10/23/18 [place] 312   Sepsis Labs: Recent Labs  Lab 10/19/18 2036 10/22/18 1935  WBC 9.8 11.1*  LATICACIDVEN  --  1.7    Microbiology Recent Results (from the past 240 hour(s))  SARS CORONAVIRUS 2 (TAT 6-12 HRS) Nasal Swab Aptima Multi Swab     Status: None   Collection Time: 10/22/18 10:35 PM   Specimen: Aptima Multi Swab; Nasal Swab  Result Value Ref Range Status   SARS Coronavirus 2 NEGATIVE NEGATIVE Final    Comment: (NOTE) SARS-CoV-2 target nucleic acids are NOT DETECTED. The SARS-CoV-2 RNA is generally detectable in upper and lower respiratory specimens during the acute phase of infection. Negative results do not preclude SARS-CoV-2 infection, do not rule out co-infections with other pathogens, and should not be used as the sole basis for treatment or other patient management decisions. Negative results must be combined with clinical observations, patient history, and epidemiological information. The expected result is Negative. Fact Sheet for Patients: SugarRoll.be Fact  Sheet for Healthcare Providers: https://www.woods-mathews.com/ This test is not yet approved or cleared by the [place] FDA and  has been authorized for detection and/or diagnosis of SARS-CoV-2 by FDA under an Emergency Use Authorization (EUA). This EUA will remain  in effect (meaning this test can be used) for the duration of the COVID-19 declaration under Section 56 4(b)(1) of the Act, 21 U.S.C. section 360bbb-3(b)(1), unless the authorization is terminated or revoked sooner. Performed at [facility], [place] [address] [place],  [address]     Procedures and diagnostic studies:  Ct Head Wo Contrast  Result Date: 10/22/2018 CLINICAL DATA:  Altered mental status EXAM: CT HEAD WITHOUT CONTRAST TECHNIQUE: Contiguous axial images were obtained from the base of the skull through the vertex without intravenous contrast. COMPARISON:  MRI 10/20/2018 FINDINGS: Brain: Low-density in the left cerebellar hemisphere compatible with infarct. This was not present on prior  MRI. No hemorrhage. No additional acute infarction. No hydrocephalus. Vascular: No hyperdense vessel or unexpected calcification. Skull: No acute calvarial abnormality. Sinuses/Orbits: Visualized paranasal sinuses and mastoids clear. Orbital soft tissues unremarkable. 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 Electronically Signed   By: Rolm Baptise M.D.   On: 10/22/2018 21:29   Mr Brain Wo Contrast  Result Date: 10/23/2018 CLINICAL DATA:  Stroke follow-up EXAM: MRI HEAD WITHOUT CONTRAST TECHNIQUE: Multiplanar, multiecho pulse sequences of the brain and surrounding structures were obtained without intravenous contrast. COMPARISON:  Brain MRI 3 days ago and head CT from yesterday FINDINGS: Brain: No acute infarction, hemorrhage, hydrocephalus, extra-axial collection or mass lesion. Mild central predominant atrophy. Vascular: Major flow voids are preserved Skull and  upper cervical spine: Negative for marrow lesion Sinuses/Orbits: Negative IMPRESSION: No acute finding. Left cerebellar low-density on prior head CT was artifactual. Electronically Signed   By: Monte Fantasia M.D.   On: 10/23/2018 07:07   Dg Chest Portable 1 View  Result Date: 10/22/2018 CLINICAL DATA:  Confusion.  Sepsis. EXAM: PORTABLE CHEST 1 VIEW COMPARISON:  Chest x-ray dated March 22, 2018. FINDINGS: Patient is rotated to the right. The heart size and mediastinal contours are within normal limits. Normal pulmonary vascularity. No focal consolidation, pleural effusion, or pneumothorax. Unchanged elevation of the right hemidiaphragm. No acute osseous abnormality. Prior thoracic spine posterior fusion again noted. IMPRESSION: No active disease. Electronically Signed   By: Titus Dubin M.D.   On: 10/22/2018 20:21   Vas [place] Lower Extremity Venous (dvt)  Result Date: 10/24/2018  Lower Venous Study Indications: Edema.  Limitations: Poor ultrasound/tissue interface. Comparison Study: No prior study Performing Technologist: Sharion Dove RVS  Examination Guidelines: A complete evaluation includes B-mode imaging, spectral Doppler, color Doppler, and power Doppler as needed of all accessible portions of each vessel. Bilateral testing is considered an integral part of a complete examination. Limited examinations for reoccurring indications may be performed as noted.  +---------+---------------+---------+-----------+----------+--------------+ RIGHT    CompressibilityPhasicitySpontaneityPropertiesThrombus Aging +---------+---------------+---------+-----------+----------+--------------+ CFV      Full           Yes      Yes                                 +---------+---------------+---------+-----------+----------+--------------+ SFJ      Full                                                        +---------+---------------+---------+-----------+----------+--------------+ FV Prox  Full                                                         +---------+---------------+---------+-----------+----------+--------------+ FV Mid   Full                                                        +---------+---------------+---------+-----------+----------+--------------+ FV DistalFull                                                        +---------+---------------+---------+-----------+----------+--------------+  POP      Full           Yes      Yes                                 +---------+---------------+---------+-----------+----------+--------------+ PTV      Full                                                        +---------+---------------+---------+-----------+----------+--------------+ PERO     Full                                                        +---------+---------------+---------+-----------+----------+--------------+   +---------+---------------+---------+-----------+----------+--------------+ LEFT     CompressibilityPhasicitySpontaneityPropertiesThrombus Aging +---------+---------------+---------+-----------+----------+--------------+ CFV      Full           Yes      Yes                                 +---------+---------------+---------+-----------+----------+--------------+ SFJ      Full                                                        +---------+---------------+---------+-----------+----------+--------------+ FV Prox  Full                                                        +---------+---------------+---------+-----------+----------+--------------+ FV Mid   Full                                                        +---------+---------------+---------+-----------+----------+--------------+ FV DistalFull                                                        +---------+---------------+---------+-----------+----------+--------------+ POP      Full           Yes      Yes                                  +---------+---------------+---------+-----------+----------+--------------+ PTV      Full                                                        +---------+---------------+---------+-----------+----------+--------------+  PERO     Full                                                        +---------+---------------+---------+-----------+----------+--------------+     Summary: Right: There is no evidence of deep vein thrombosis in the lower extremity. No cystic structure found in the popliteal fossa. Left: There is no evidence of deep vein thrombosis in the lower extremity. No cystic structure found in the popliteal fossa.  *See table(s) above for measurements and observations. Electronically signed by Deitra Mayo MD on 10/24/2018 at 10:49:50 AM.    Final     Medications:   .  stroke: mapping our early stages of recovery book   Does not apply Once  . chlorhexidine  15 mL Mouth Rinse BID  . lamoTRIgine  100 mg Oral BID  . levothyroxine  100 mcg Oral QAC breakfast  . mouth rinse  15 mL Mouth Rinse q12n4p  . metoprolol tartrate  25 mg Oral BID  . QUEtiapine  150 mg Oral QHS  . sodium chloride flush  3 mL Intravenous Once  . warfarin  5 mg Oral ONCE-1800  . Warfarin - Pharmacist Dosing Inpatient   Does not apply q1800   Continuous Infusions:   LOS: 1 day   Geradine Girt  [facility]   How to contact the [facility] Attending or Consulting provider [place] or covering provider during after hours [place], for this patient?  1. Check the care team in [facility] and look for a) attending/consulting [facility] provider listed and b) the [facility] team listed 2. Log into [email or web address] and use [place]'s universal password to access. If you do not have the password, please contact the hospital operator. 3. Locate the [facility] provider you are looking for under [facility] and page to a number that you can be directly reached. 4. If you still have difficulty reaching the provider, please  page the [facility] (Director on Call) for the Hospitalists listed on amion for assistance.  10/24/2018, 12:40 PM

## 2018-10-24 NOTE — Evaluation (Signed)
Physical Therapy Evaluation Patient Details Name: Meredith [name] MRN: [id number] DOB: April 06, 1950 Today's Date: 10/24/2018   History of Present Illness  Pt is a 68 y/o female presenting to the ED secondary to weakness and multiple falls. PMH includes a fib, bipolar and osteoporosis.   Clinical Impression  Patient received in bed, awake, alert, but not very verbal. Patient having difficulty following direction to assist with mobility. Patient requires max assist +2 for supine >< sit. Once seated at the edge of the bed she is unable to maintain sitting balance without external support. She is holding right LE off floor despite cues to put it down. Also holding B UEs crossed and tightly fisted. Attempted to assist patient to standing, however she did not help at all and required total +2 assist to raise bottom from bed slightly. Patient will benefit from PT follow up while here to improve functional mobility as able.      Follow Up Recommendations SNF;Supervision/Assistance - 24 hour    Equipment Recommendations  None recommended by PT    Recommendations for Other Services       Precautions / Restrictions Precautions Precautions: Fall Precaution Comments: Pt reports multiple falls at home.  Restrictions Weight Bearing Restrictions: No      Mobility  Bed Mobility Overal bed mobility: Needs Assistance Bed Mobility: Supine to Sit     Supine to sit: +2 for physical assistance;+2 for safety/equipment;Max assist Sit to supine: Max assist;+2 for physical assistance;+2 for safety/equipment   General bed mobility comments: patient keeping right foot off the floor in sitting, arms crossed. despite cues patient unable to assist with sitting balance, requiring external support.  Transfers Overall transfer level: Needs assistance Equipment used: Rolling walker (2 wheeled);2 person hand held assist Transfers: Sit to/from Stand Sit to Stand: Max assist;Total assist;+2 physical assistance         General transfer comment: Patient attempted to stand using RW, unable to put hands on walker. Attempted to assist patient to standing with B hand held assist. Required total assist to lift bottom from bed a couple of inches.  Ambulation/Gait             General Gait Details: unable  Stairs            Wheelchair Mobility    Modified Rankin (Stroke Patients Only)       Balance Overall balance assessment: Needs assistance Sitting-balance support: Feet supported Sitting balance-Leahy Scale: Poor Sitting balance - Comments: requires external support for sitting balance Postural control: Posterior lean;Right lateral lean;Left lateral lean                                   Pertinent Vitals/Pain Pain Assessment: Faces Faces Pain Scale: Hurts little more Pain Location: left LE Pain Descriptors / Indicators: Grimacing;Guarding Pain Intervention(s): Monitored during session    Home Living Family/patient expects to be discharged to:: Private residence Living Arrangements: Alone Available Help at Discharge: Family;Available PRN/intermittently Type of Home: Apartment Home Access: Level entry     Home Layout: One level Home Equipment: Walker - 2 wheels      Prior Function Level of Independence: Independent with assistive device(s)         Comments: Had been using RW, however, reports multiple falls. Decreased ability with mobility leading to this hospitalization.     Hand Dominance        Extremity/Trunk Assessment   Upper Extremity Assessment  Upper Extremity Assessment: Difficult to assess due to impaired cognition    Lower Extremity Assessment Lower Extremity Assessment: Difficult to assess due to impaired cognition    Cervical / Trunk Assessment Cervical / Trunk Assessment: Kyphotic  Communication   Communication: Expressive difficulties;Other (comment)(minially verbal)  Cognition Arousal/Alertness: Awake/alert Behavior During  Therapy: Flat affect(staring off) Overall Cognitive Status: No family/caregiver present to determine baseline cognitive functioning                                 General Comments: slow processing, difficulty following commands      General Comments      Exercises     Assessment/Plan    PT Assessment Patient needs continued PT services  PT Problem List Decreased strength;Decreased mobility;Decreased safety awareness;Decreased activity tolerance;Decreased balance;Decreased cognition;Pain;Decreased coordination       PT Treatment Interventions Therapeutic activities;Gait training;Therapeutic exercise;Patient/family education;Functional mobility training;Neuromuscular re-education;Balance training    PT Goals (Current goals can be found in the Care Plan section)  Acute Rehab PT Goals Patient Stated Goal: none stated PT Goal Formulation: Patient unable to participate in goal setting Time For Goal Achievement: 11/03/18 Potential to Achieve Goals: Fair    Frequency Min 2X/week   Barriers to discharge Decreased caregiver support      Co-evaluation PT/OT/SLP Co-Evaluation/Treatment: Yes Reason for Co-Treatment: For patient/therapist safety;To address functional/ADL transfers;Necessary to address cognition/behavior during functional activity PT goals addressed during session: Mobility/safety with mobility;Strengthening/ROM;Balance         AM-PAC PT "6 Clicks" Mobility  Outcome Measure Help needed turning from your back to your side while in a flat bed without using bedrails?: Total Help needed moving from lying on your back to sitting on the side of a flat bed without using bedrails?: Total Help needed moving to and from a bed to a chair (including a wheelchair)?: Total Help needed standing up from a chair using your arms (e.g., wheelchair or bedside chair)?: Total Help needed to walk in hospital room?: Total Help needed climbing 3-5 steps with a railing? :  Total 6 Click Score: 6    End of Session Equipment Utilized During Treatment: Gait belt Activity Tolerance: Patient limited by lethargy Patient left: in bed;with bed alarm set;with call bell/phone within reach Nurse Communication: Mobility status PT Visit Diagnosis: Muscle weakness (generalized) (M62.81);Other abnormalities of gait and mobility (R26.89);History of falling (Z91.81);Pain;Other symptoms and signs involving the nervous system (R29.898) Pain - Right/Left: Left Pain - part of body: Leg    Time: [id number] PT Time Calculation (min) (ACUTE ONLY): 28 min   Charges:   PT Evaluation $PT Eval Moderate Complexity: 1 Mod PT Treatments $Therapeutic Activity: 8-22 mins        Kristyn Conetta, PT, GCS 10/24/18,1:02 PM

## 2018-10-24 NOTE — Progress Notes (Addendum)
Reason for consult: Altered mental status  Subjective: No acute events overnight.  Patient was given IV Ativan 2 mg yesterday evening with no significant improvement.  However this morning she appears to be more conversant.    ROS: negative except above   Examination  Vital signs in last 24 hours: Temp:  [98.3 F (36.8 C)-99.4 F (37.4 C)] 98.3 F (36.8 C) (08/28 1247) Pulse Rate:  [64-88] 78 (08/28 1247) Resp:  [17-22] 22 (08/28 1247) BP: (153-184)/(65-92) 169/88 (08/28 1247) SpO2:  [96 %-100 %] 96 % (08/28 1247)  General: lying in bed, not in apparent distress CVS: pulse-normal rate and rhythm RS: breathing comfortably on room air Extremities: normal, continuously shaking right foot  Neuro: MS: Alert, oriented to self and place (doctor's office), follows commands but eventually starts perseverating CN: pupils equal and reactive,  EOMI, face symmetric, tongue midline, normal sensation over face, Motor: 5/5 strength in all 4 extremities, slightly increased tone in all extremities which appears to be volitional with some component cogwheel rigidity in both hands  reflexes: 2+ bilaterally over patella, biceps, plantars: Unable to assess as patient is very ticklish Coordination: normal Gait: not tested  Basic Metabolic Panel: Recent Labs  Lab 10/19/18 2036 10/22/18 1935 10/24/18 0418  NA 136 140 144  K 3.9 4.6 4.0  CL 103 108 115*  CO2 23 19* 18*  GLUCOSE 105* 143* 115*  BUN 18 23 22   CREATININE 1.32* 1.25* 1.05*  CALCIUM 9.7 10.2 9.4    CBC: Recent Labs  Lab 10/19/18 2036 10/22/18 1935  WBC 9.8 11.1*  NEUTROABS 7.1 9.4*  HGB 12.2 13.1  HCT 36.9 40.3  MCV 90.4 92.0  PLT 297 303     Coagulation Studies: Recent Labs    10/22/18 1935 10/23/18 0416 10/24/18 0418  LABPROT 24.7* 24.7* 22.5*  INR 2.3* 2.3* 2.0*   Imaging CT head 10/22/2018: Since recent MRI, acute infarct in the left cerebellar hemisphere. MR Brain wo contrast 10/23/2018:  No acute  finding. Left cerebellar low-density on prior head CT was artifactual.  ASSESSMENT AND PLAN Meredith [name] is a 68 year old female with history of bipolar disorder on multiple medications at home who presented with altered mental status.  Initial CT head was concerning for cerebellar stroke.  However MRI brain did not show any acute abnormality. Routine EEG suggestive of mild encephalopathy, no epileptiform discharges. Differential diagnosis at this point include toxic metabolic encephalopathy, lithium toxicity, iatrogenic secondary to medication use, possible serotonin syndrome.    Encephalopathy, toxic-metabolic ( improving) - AKI is improving.  Her mental status also appears to improved compared to yesterday.  Therefore most likely this is toxic metabolic secondary to lithium toxicity  Recommendations -Continue to hold off on all serotonergic drugs and lithium at this point until patient is back to baseline -Would defer to psychiatrist whether patient can be restarted at home dose of risperidone and lithium at the time of discharge -Correction of all toxic-metabolic abnormalities per primary team -Patient's brother had expressed concern about patient sometimes getting confused and not taking her medications correctly.  Consider pillbox at the time of discharge as well as other services that might help patient appropriately take her medications -Follow-up with a psychiatrist as an outpatient   Thank you for allowing [place] to participate in the care of this patient.  Please page neuro hospitalist service for any further questions.  Priyanka Barbra Sarks

## 2018-10-24 NOTE — Evaluation (Signed)
Occupational Therapy Evaluation Patient Details Name: Meredith [name] MRN: [id number] DOB: 1950-04-04 Today's Date: 10/24/2018    History of Present Illness 68 year old female with history of bipolar disorder, HLD, anxiety  who presented with altered mental status. MRI brain: negative. EEG to evaluate for seizures: suggestive of cortical dysfunction in the left temporal region as well as mild diffuse encephalopathy. No seizures or epileptiform   Clinical Impression   Pt PTA: recently requiring assist with ADL and mobility with frequent falls. Pt currently following 50% of commands and often requiring multiple cues to attend to tasks. Pt performing ADL tasks with maxA to totalA overall. Pt maxA +1 for rolling side to side and totalA for transfer. Attempted sit to stand, but unable to complete as pt not helping. Unable to test fully as pt with diffculty scanning environment due to poor ability to follow commands and attend to task. Pt would benefit from continued OT skilled services for ADL, mobility and safety. OT following acutely.    Follow Up Recommendations  SNF;Supervision/Assistance - 24 hour    Equipment Recommendations  None recommended by OT    Recommendations for Other Services       Precautions / Restrictions Precautions Precautions: Fall Precaution Comments: Pt reports multiple falls at home.  Restrictions Weight Bearing Restrictions: No      Mobility Bed Mobility Overal bed mobility: Needs Assistance Bed Mobility: Supine to Sit     Supine to sit: Max assist;+2 for physical assistance;+2 for safety/equipment Sit to supine: Max assist;+2 for physical assistance;+2 for safety/equipment   General bed mobility comments: patient keeping right foot off the floor in sitting, arms crossed. despite cues patient unable to assist with sitting balance, requiring external support.  Transfers Overall transfer level: Needs assistance Equipment used: Rolling walker (2 wheeled);2  person hand held assist Transfers: Sit to/from Stand Sit to Stand: Max assist;Total assist;+2 physical assistance         General transfer comment: Patient attempted to stand using RW, unable to put hands on walker. Attempted to assist patient to standing with B hand held assist. Required total assist to lift bottom from bed a couple of inches.    Balance Overall balance assessment: Needs assistance Sitting-balance support: Feet supported Sitting balance-Leahy Scale: Poor Sitting balance - Comments: requires external support for sitting balance Postural control: Posterior lean;Right lateral lean;Left lateral lean Standing balance support: Bilateral upper extremity supported Standing balance-Leahy Scale: Poor                             ADL either performed or assessed with clinical judgement   ADL Overall ADL's : Needs assistance/impaired Eating/Feeding: NPO   Grooming: Maximal assistance;Sitting   Upper Body Bathing: Total assistance   Lower Body Bathing: Total assistance   Upper Body Dressing : Total assistance   Lower Body Dressing: Total assistance   Toilet Transfer: Total assistance   Toileting- Clothing Manipulation and Hygiene: Total assistance       Functional mobility during ADLs: Total assistance;+2 for physical assistance;+2 for safety/equipment General ADL Comments: totalA for mobility and self care     Vision Baseline Vision/History: No visual deficits Vision Assessment?: Vision impaired- to be further tested in functional context Additional Comments: unable to test fully as pt with diffculty scanning environment due to poor ability to follow commands and attend to task.     Perception     Praxis      Pertinent Vitals/Pain Pain Assessment:  Faces Faces Pain Scale: Hurts little more Pain Location: left LE Pain Descriptors / Indicators: Grimacing;Guarding Pain Intervention(s): Limited activity within patient's tolerance     Hand  Dominance Right   Extremity/Trunk Assessment Upper Extremity Assessment Upper Extremity Assessment: Difficult to assess due to impaired cognition;RUE deficits/detail;LUE deficits/detail RUE Deficits / Details: R elbow stiff with increased tone; hand flexed into fixed fist position and did not loosen until lying flat in bed. RUE Coordination: decreased fine motor;decreased gross motor LUE Deficits / Details: PROM, WFLs LUE Coordination: decreased fine motor;decreased gross motor   Lower Extremity Assessment Lower Extremity Assessment: Difficult to assess due to impaired cognition   Cervical / Trunk Assessment Cervical / Trunk Assessment: Kyphotic   Communication Communication Communication: Expressive difficulties   Cognition Arousal/Alertness: Awake/alert Behavior During Therapy: Flat affect Overall Cognitive Status: No family/caregiver present to determine baseline cognitive functioning                                 General Comments: slow processing, difficulty following commands   General Comments  pt's brother outside of room and hopeful for his sister to recover.    Exercises     Shoulder Instructions      Home Living Family/patient expects to be discharged to:: Private residence Living Arrangements: Alone Available Help at Discharge: Family;Available PRN/intermittently Type of Home: Apartment Home Access: Level entry     Home Layout: One level     Bathroom Shower/Tub: Teacher, early years/pre: Standard     Home Equipment: Environmental consultant - 2 wheels   Additional Comments: taken from prior admission as pt unabl e to sustain attention long enough to answer      Prior Functioning/Environment Level of Independence: Independent with assistive device(s)        Comments: Falls and inability to mobilize and perform own ADL        OT Problem List: Decreased strength;Decreased activity tolerance;Impaired balance (sitting and/or  standing);Decreased safety awareness;Decreased coordination;Decreased cognition;Impaired UE functional use      OT Treatment/Interventions: Self-care/ADL training;Therapeutic exercise;Neuromuscular education;Energy conservation;Therapeutic activities;Patient/family education;Balance training    OT Goals(Current goals can be found in the care plan section) Acute Rehab OT Goals Patient Stated Goal: none stated OT Goal Formulation: With patient Time For Goal Achievement: 11/07/18 Potential to Achieve Goals: Good ADL Goals Pt Will Perform Grooming: with modified independence;sitting Pt Will Perform Lower Body Dressing: with modified independence;sit to/from stand Pt Will Transfer to Toilet: with min guard assist;stand pivot transfer Pt Will Perform Toileting - Clothing Manipulation and hygiene: with min guard assist;sit to/from stand Additional ADL Goal #1: Pt will perform x5 mins of sustained attention for ADL with minimal verbal cues.  OT Frequency: Min 2X/week   Barriers to D/C: Decreased caregiver support  Unable to provide heavy physical assist x24 hours       Co-evaluation   Reason for Co-Treatment: For patient/therapist safety;To address functional/ADL transfers;Necessary to address cognition/behavior during functional activity PT goals addressed during session: Mobility/safety with mobility;Strengthening/ROM;Balance        AM-PAC OT "6 Clicks" Daily Activity     Outcome Measure Help from another person eating meals?: Total Help from another person taking care of personal grooming?: Total Help from another person toileting, which includes using toliet, bedpan, or urinal?: Total Help from another person bathing (including washing, rinsing, drying)?: Total Help from another person to put on and taking off regular upper body clothing?: Total Help  from another person to put on and taking off regular lower body clothing?: Total 6 Click Score: 6   End of Session Equipment  Utilized During Treatment: Gait belt;Rolling walker Nurse Communication: Mobility status  Activity Tolerance: Treatment limited secondary to medical complications (Comment) Patient left: in bed;with call bell/phone within reach;with bed alarm set  OT Visit Diagnosis: Unsteadiness on feet (R26.81);Muscle weakness (generalized) (M62.81)                Time: [id number] OT Time Calculation (min): 28 min Charges:  OT General Charges $OT Visit: 1 Visit OT Evaluation $OT Eval Moderate Complexity: 1 Mod  Darryl Nestle) Marsa Aris OTR/L Acute Rehabilitation Services Pager: [phone] Office: [phone]   Audie Pinto 10/24/2018, 1:38 PM

## 2018-10-24 NOTE — Telephone Encounter (Signed)
[facility]  [facility] P[facility]   Just to coordinate her care and disc outpatient FU plans Current info is that she was likely lithium toxic  Will sched pat within a week of DC  Lynder Parents, MD, DFAPA                                                                                                                                                                            Disc her

## 2018-10-24 NOTE — Plan of Care (Signed)
Patient stable, unable to participate in POC d/t mentation. Discussed POC with patient's brother, agreeable with plan, denies question/concerns at this time.

## 2018-10-24 NOTE — Evaluation (Signed)
Clinical/Bedside Swallow Evaluation Patient Details  Name: Meredith [name] MRN: [id number] Date of Birth: 1950-11-03  Today's Date: 10/24/2018 Time: SLP Start Time (ACUTE ONLY): 1140 SLP Stop Time (ACUTE ONLY): 1156 SLP Time Calculation (min) (ACUTE ONLY): 16 min  Past Medical History:  Past Medical History:  Diagnosis Date  . Anxiety   . Atrial fibrillation ([place])   . Bipolar 2 disorder ([place])   . Depression   . History of cardioversion    x2  . Hyperlipidemia   . Hypothyroidism   . Migraine headache   . Osteoporosis    Past Surgical History:  Past Surgical History:  Procedure Laterality Date  . ABLATION    . BACK SURGERY  2014  . CARDIOVERSION  12/2013, 01/2014   x2  . KNEE SURGERY     ORTHOSCOPIC  . LUNG REMOVAL, PARTIAL Right    BENIGN LUNG CYST  . THORACIC SPINE SURGERY Right   . THUMB ARTHROSCOPY     HPI:  Meredith [name] is a 68 y.o. female with medical history significant for bipolar disorder, anxiety, atrial fibrillation, hyperlipidemia, hypothyroidism, and chronic migraine, presenting to the emergency department for evaluation of altered mental status. Per chart family has noted a couple week decline in cognition, strength and significantly decreased po  intake. Chest x-ray is negative for acute cardiopulmonary disease. CT originally detected cerebellar infarct however MRI 8/27 revealed no acute finding and that left cerebellar low-density on prior head CT was artifactual.    Assessment / Plan / Recommendation Clinical Impression  Pt's brother present during swallow assessment. Pt exhibits swallowing disturbance impacted by current medical status/psyc interferences. She consistently verbalized "uh huh" to all questions asked (some inappropriately). She did follow 2 commands if provided a visual model. Significant lingual candidias and therapist ordered the adult oral care protocol. Brother reports pegs for implants have been placed in lower gums and waitng to heal  prior to placement the implant. Pt pursing lips upon puree presentation with minimal amount passing lips which she proceeded to expectorate. She would not accept cup or straws. Once she can attend and more actively participate and accept po's she should be able to initiate a diet/liquids. Will not order diet but discussed with RN if her condition improves, she can clean oral cavity and attempt ice/water. ST will follow.     SLP Visit Diagnosis: Dysphagia, unspecified (R13.10)    Aspiration Risk       Diet Recommendation NPO;Other (Comment)(RN can attempt ice/water if improves this afternoon)   Medication Administration: Via alternative means    Other  Recommendations Oral Care Recommendations: Oral care QID   Follow up Recommendations 24 hour supervision/assistance      Frequency and Duration min 2x/week  2 weeks       Prognosis Prognosis for Safe Diet Advancement: Good Barriers to Reach Goals: Behavior;Cognitive deficits      Swallow Study   General HPI: Meredith [name] is a 68 y.o. female with medical history significant for bipolar disorder, anxiety, atrial fibrillation, hyperlipidemia, hypothyroidism, and chronic migraine, presenting to the emergency department for evaluation of altered mental status. Per chart family has noted a couple week decline in cognition, strength and significantly decreased po  intake. Chest x-ray is negative for acute cardiopulmonary disease. CT originally detected cerebellar infarct however MRI 8/27 revealed no acute finding and that left cerebellar low-density on prior head CT was artifactual.  Type of Study: Bedside Swallow Evaluation Previous Swallow Assessment: (none) Diet Prior to this Study: NPO  Temperature Spikes Noted: No Respiratory Status: Room air History of Recent Intubation: No Behavior/Cognition: Alert;Confused;Distractible;Requires cueing Oral Cavity Assessment: Other (comment)(lingual candidias) Oral Care Completed by SLP: Yes Oral  Cavity - Dentition: Edentulous(lower pegs for implants (waiting gums to heal)) Vision: (?) Self-Feeding Abilities: Total assist Patient Positioning: Upright in bed Baseline Vocal Quality: Normal Volitional Cough: Cognitively unable to elicit Volitional Swallow: Unable to elicit    Oral/Motor/Sensory Function Overall Oral Motor/Sensory Function: (only opened mouth to visual cue)   Ice Chips Ice chips: Not tested   Thin Liquid Thin Liquid: (refused straw)    Nectar Thick Nectar Thick Liquid: Not tested   Honey Thick Honey Thick Liquid: Not tested   Puree Puree: (refused, expectorated)   Solid     Solid: Not tested      Houston Siren 10/24/2018,12:41 PM  Orbie Pyo Colvin Caroli.Ed Risk analyst [phone] Office [phone]

## 2018-10-25 LAB — BASIC METABOLIC PANEL
Anion gap: 12 (ref 5–15)
BUN: 19 mg/dL (ref 8–23)
CO2: 22 mmol/L (ref 22–32)
Calcium: 9.6 mg/dL (ref 8.9–10.3)
Chloride: 110 mmol/L (ref 98–111)
Creatinine, Ser: 1.01 mg/dL — ABNORMAL HIGH (ref 0.44–1.00)
GFR calc Af Amer: >60 mL/min (ref >60)
GFR calc non Af Amer: 57 mL/min — ABNORMAL LOW (ref >60)
Glucose, Bld: 122 mg/dL — ABNORMAL HIGH (ref 70–99)
Potassium: 3.2 mmol/L — ABNORMAL LOW (ref 3.5–5.1)
Sodium: 144 mmol/L (ref 135–145)

## 2018-10-25 LAB — CBC
HCT: 39.6 % (ref 36.0–46.0)
Hemoglobin: 13.6 g/dL (ref 12.0–15.0)
MCH: 30.3 pg (ref 26.0–34.0)
MCHC: 34.3 g/dL (ref 30.0–36.0)
MCV: 88.2 fL (ref 80.0–100.0)
Platelets: 249 10*3/uL (ref 150–400)
RBC: 4.49 MIL/uL (ref 3.87–5.11)
RDW: 14.1 % (ref 11.5–15.5)
WBC: 9.6 10*3/uL (ref 4.0–10.5)
nRBC: 0 % (ref 0.0–0.2)

## 2018-10-25 LAB — PROTIME-INR
INR: 1.6 — ABNORMAL HIGH (ref 0.8–1.2)
Prothrombin Time: 18.6 seconds — ABNORMAL HIGH (ref 11.4–15.2)

## 2018-10-25 MED ORDER — CYANOCOBALAMIN 1000 MCG/ML IJ SOLN
1000.0000 ug | Freq: Once | INTRAMUSCULAR | Status: AC
  Administered 2018-10-25: 1000 ug via INTRAMUSCULAR
  Filled 2018-10-25: qty 1

## 2018-10-25 MED ORDER — VERAPAMIL HCL ER 120 MG PO TBCR
120.0000 mg | EXTENDED_RELEASE_TABLET | Freq: Every day | ORAL | Status: DC
  Administered 2018-10-26 – 2018-10-27 (×2): 120 mg via ORAL
  Filled 2018-10-25 (×5): qty 1

## 2018-10-25 MED ORDER — PANTOPRAZOLE SODIUM 40 MG PO TBEC
40.0000 mg | DELAYED_RELEASE_TABLET | Freq: Every day | ORAL | Status: DC
  Administered 2018-10-25 – 2018-10-28 (×4): 40 mg via ORAL
  Filled 2018-10-25 (×4): qty 1

## 2018-10-25 MED ORDER — POTASSIUM CHLORIDE CRYS ER 20 MEQ PO TBCR
40.0000 meq | EXTENDED_RELEASE_TABLET | Freq: Once | ORAL | Status: AC
  Administered 2018-10-25: 40 meq via ORAL
  Filled 2018-10-25: qty 2

## 2018-10-25 MED ORDER — WARFARIN SODIUM 5 MG PO TABS
5.0000 mg | ORAL_TABLET | Freq: Once | ORAL | Status: AC
  Administered 2018-10-25: 5 mg via ORAL
  Filled 2018-10-25: qty 1

## 2018-10-25 MED ORDER — BUSPIRONE HCL 10 MG PO TABS
15.0000 mg | ORAL_TABLET | Freq: Two times a day (BID) | ORAL | Status: DC
  Administered 2018-10-25 – 2018-10-28 (×7): 15 mg via ORAL
  Filled 2018-10-25 (×7): qty 2

## 2018-10-25 NOTE — NC FL2 (Signed)
[place] LEVEL OF CARE SCREENING TOOL     IDENTIFICATION  Patient Name: Meredith [name] Birthdate: 03/10/1950 Sex: female Admission Date (Current Location): 10/22/2018  [facility] and [place] Number:  Herbalist and Address:  [facility], [place] [address] [place], [place] [address]      Provider Number: [id number]  Attending Physician Name and Address:  Geradine Girt, DO  Relative Name and Phone Number:  [place], [phone]    Current Level of Care: Hospital Recommended Level of Care: [place] Prior Approval Number:    Date Approved/Denied:   PASRR Number: Under Manual Review  Discharge Plan: SNF    Current Diagnoses: Patient Active Problem List   Diagnosis Date Noted  . Acute encephalopathy   . Acute ischemic stroke ([place]) 10/22/2018  . Renal insufficiency 10/22/2018  . Pain and swelling of right lower leg 10/22/2018  . Hypothyroidism 02/24/2018  . Encounter for therapeutic drug monitoring 01/03/2018  . PTSD (post-traumatic stress disorder) 11/29/2017  . Falls 11/25/2017  . Disorientation   . Delirium 11/24/2017  . Stiffness of finger joint of right hand 06/18/2017  . Osteoarthritis of carpometacarpal (CMC) joint of thumb 05/21/2017  . Anxiety 11/29/2016  . Chronic pain syndrome 07/30/2016  . Bipolar disorder, curr episode mixed, severe, with psychotic features ([place]) 07/28/2016  . PAF (paroxysmal atrial fibrillation) ([place]) 11/14/2015  . Intractable chronic migraine without aura 04/24/2012    Orientation RESPIRATION BLADDER Height & Weight     Self  Normal Incontinent, External catheter Weight:   Height:     BEHAVIORAL SYMPTOMS/MOOD NEUROLOGICAL BOWEL NUTRITION STATUS      Incontinent Diet(regular diet, thin liquids, needs assistance)  AMBULATORY STATUS COMMUNICATION OF NEEDS Skin   Extensive Assist Verbally Normal(has dry skin)                       Personal Care  Assistance Level of Assistance  Bathing, Feeding, Dressing, Total care Bathing Assistance: Maximum assistance Feeding assistance: Limited assistance Dressing Assistance: Maximum assistance Total Care Assistance: Maximum assistance   Functional Limitations Info  Sight, Hearing, Speech Sight Info: Adequate Hearing Info: Adequate Speech Info: Adequate    SPECIAL CARE FACTORS FREQUENCY  PT (By licensed PT), OT (By licensed OT), Speech therapy     PT Frequency: 5x/wk OT Frequency: 5x/wk     Speech Therapy Frequency: 2x/wk      Contractures Contractures Info: Not present    Additional Factors Info  Code Status, Allergies, Psychotropic Code Status Info: Full Code Allergies Info: Codeine, Adhesive (Tape), Celebrex (Celecoxib), Sulfa Antibiotics, Tricyclic Antidepressants, Amoxicillin, Ampicillin, Cephalexin, Penicillins Psychotropic Info: seroquel xr tablet 150mg  daily at bedtime & lamictal 100mg  2x daily         Current Medications (10/25/2018):  This is the current hospital active medication list Current Facility-Administered Medications  Medication Dose Route Frequency Provider Last Rate Last Dose  . acetaminophen (TYLENOL) tablet 650 mg  650 mg Oral Q4H PRN Opyd, Ilene Qua, MD       Or  . acetaminophen (TYLENOL) solution 650 mg  650 mg Per Tube Q4H PRN Opyd, Ilene Qua, MD       Or  . acetaminophen (TYLENOL) suppository 650 mg  650 mg Rectal Q4H PRN Opyd, Ilene Qua, MD      . chlorhexidine (PERIDEX) 0.12 % solution 15 mL  15 mL Mouth Rinse BID Vann, Jessica U, DO   15 mL at 10/25/18 0913  .  haloperidol lactate (HALDOL) injection 2 mg  2 mg Intravenous Q6H PRN Eulogio Bear U, DO   2 mg at 10/24/18 1846  . lamoTRIgine (LAMICTAL) tablet 100 mg  100 mg Oral BID Dyanne Carrel M, NP   100 mg at 10/25/18 1025  . levothyroxine (SYNTHROID) tablet 100 mcg  100 mcg Oral QAC breakfast Radene Gunning, NP      . MEDLINE mouth rinse  15 mL Mouth Rinse q12n4p Vann, Jessica U, DO   15 mL at  10/25/18 1117  . metoprolol tartrate (LOPRESSOR) tablet 25 mg  25 mg Oral BID Radene Gunning, NP   25 mg at 10/25/18 1025  . QUEtiapine (SEROQUEL XR) 24 hr tablet 150 mg  150 mg Oral QHS Black, Karen M, NP      . senna-docusate (Senokot-S) tablet 1 tablet  1 tablet Oral QHS PRN Opyd, Ilene Qua, MD      . sodium chloride flush (NS) 0.9 % injection 3 mL  3 mL Intravenous Once Opyd, Ilene Qua, MD      . warfarin (COUMADIN) tablet 5 mg  5 mg Oral ONCE-1800 Vann, Jessica U, DO      . Warfarin - Pharmacist Dosing Inpatient   Does not apply q1800 Jiles Crocker Valeda Malm [place] at 10/24/18 1805     Discharge Medications: Please see discharge summary for a list of discharge medications.  Relevant Imaging Results:  Relevant Lab Results:   Additional Information SSN: [id number]  Philippa Chester Pinion, LCSWA

## 2018-10-25 NOTE — Progress Notes (Signed)
Progress Note    Meredith [name]  [id number] DOB: Jul 04, 1950  DOA: 10/22/2018 PCP: Kristen Loader, FNP    Brief Narrative:     Medical records reviewed and are as summarized below:  Meredith [name] is an 68 y.o. female with medical history significant for bipolar disorder, atrial fibrillation on Coumadin, hyperlipidemia, hypothyroidism, and chronic migraine, presenting to the emergency department for evaluation of altered mental status.  Work up in process but appears mentation could be related to lithium toxicity.  Assessment/Plan:   Principal Problem:   Acute encephalopathy Active Problems:   PAF (paroxysmal atrial fibrillation) (HCC)   Bipolar disorder, curr episode mixed, severe, with psychotic features (HCC)   Chronic pain syndrome   Anxiety   Hypothyroidism   Acute ischemic stroke (HCC)   Renal insufficiency   Pain and swelling of right lower leg  1.acute encephalopathy due to lithium toxicity  -Presented with expressive aphasia, recurrent falls, and CT concerning for stroke but MRI revealed no acute infarct noting ct findings artifact -Reportedly has not taken meds for several days. -Ammonia, TSH, CK all within limits of normal. Vit B12 within limits of normal. -EEG did not show seizure like activity -per chart review: Dr. Clovis Pu: I have spoken to the neurologist taking care of her.  We both agree that she likely had lithium toxicity and it should be expected to clear shortly -PT/OT consult- SNF -8/29 patient seems much improved this AM and is able to take in PO  2.Paroxysmal atrial fibrillationIn sinus rhythm on admission. Home meds include coumadin and metoprolol. CHADS-VASc is at least 4 (CVA x2, age, gender).INR is therapeutic at 2.3 on admission -coumadin per pharmacy   4.Mild renal insufficiencySCr is 1.25 on admission, was 1.32 three days ago but <1 prior to that. Likely related to decreased oral intake. -hold nephrotoxins -Cr improved   5.HypothyroidismTSH within limits of normal.  - continue Synthroid  6.Bipolar disorder -slowly resume home meds (sans lithium)  7.Chronic pain -now that she is taking PO, slowly resume home meds  8. Right calf swelling and tenderness   -duplex negative  Family Communication/Anticipated D/C date and plan/Code Status   DVT prophylaxis: Lovenox ordered. Code Status: Full Code.  Family Communication:  Disposition Plan: pending mental status improvement/ability to take PO/ PT/OT evaluation   Medical Consultants:    Neurology  Subjective:   Will awake and say a few words Denies pain  Objective:    Vitals:   10/25/18 0014 10/25/18 0305 10/25/18 0700 10/25/18 1121  BP: (!) 160/87 (!) 151/86 136/75 (!) 170/92  Pulse: 96 87 78 70  Resp: 18 18 18 16   Temp: 99 F (37.2 C) 99.5 F (37.5 C) 99 F (37.2 C) 99 F (37.2 C)  TempSrc: Oral Oral Axillary Oral  SpO2: 100% 99% 99% 97%    Intake/Output Summary (Last 24 hours) at 10/25/2018 1442 Last data filed at 10/24/2018 1700 Gross per 24 hour  Intake 0 ml  Output -  Net 0 ml   There were no vitals filed for this visit.  Exam: Much more awake and interactive irr No increase work of breathing Moves all 4 ext Bruise on left hip   Data Reviewed:   I have personally reviewed following labs and imaging studies:  Labs: Labs show the following:   Basic Metabolic Panel: Recent Labs  Lab 10/19/18 2036 10/22/18 1935 10/24/18 0418 10/25/18 0612  NA 136 140 144 144  K 3.9 4.6 4.0 3.2*  CL 103  108 115* 110  CO2 23 19* 18* 22  GLUCOSE 105* 143* 115* 122*  BUN 18 23 22 19   CREATININE 1.32* 1.25* 1.05* 1.01*  CALCIUM 9.7 10.2 9.4 9.6   GFR Estimated Creatinine Clearance: 49.4 mL/min (A) (by C-G formula based on SCr of 1.01 mg/dL (H)). Liver Function Tests: Recent Labs  Lab 10/19/18 2036 10/22/18 1935  AST 20 17  ALT 14 14  ALKPHOS 73 78  BILITOT 0.4 0.6  PROT 7.2 7.1  ALBUMIN 4.3 4.6   No  results for input(s): LIPASE, AMYLASE in the last 168 hours. Recent Labs  Lab 10/23/18 0414  AMMONIA 17   Coagulation profile Recent Labs  Lab 10/22/18 1935 10/23/18 0416 10/24/18 0418 10/25/18 0612  INR 2.3* 2.3* 2.0* 1.6*    CBC: Recent Labs  Lab 10/19/18 2036 10/22/18 1935 10/23/18 0414 10/25/18 0612  WBC 9.8 11.1*  --  9.6  NEUTROABS 7.1 9.4*  --   --   HGB 12.2 13.1  --  13.6  HCT 36.9 40.3 36.0 39.6  MCV 90.4 92.0  --  88.2  PLT 297 303  --  249   Cardiac Enzymes: Recent Labs  Lab 10/20/18 0126 10/23/18 0414  CKTOTAL 156 54   BNP (last 3 results) No results for input(s): PROBNP in the last 8760 hours. CBG: Recent Labs  Lab 10/22/18 1916  GLUCAP 138*   D-Dimer: No results for input(s): DDIMER in the last 72 hours. Hgb A1c: Recent Labs    10/23/18 0416  HGBA1C 6.0*   Lipid Profile: Recent Labs    10/23/18 0416  CHOL 212*  HDL 94  LDLCALC 97  TRIG 103  CHOLHDL 2.3   Thyroid function studies: Recent Labs    10/23/18 0414  TSH 4.237   Anemia work up: Recent Labs    10/23/18 [place] 312   Sepsis Labs: Recent Labs  Lab 10/19/18 2036 10/22/18 1935 10/25/18 0612  WBC 9.8 11.1* 9.6  LATICACIDVEN  --  1.7  --     Microbiology Recent Results (from the past 240 hour(s))  SARS CORONAVIRUS 2 (TAT 6-12 HRS) Nasal Swab Aptima Multi Swab     Status: None   Collection Time: 10/22/18 10:35 PM   Specimen: Aptima Multi Swab; Nasal Swab  Result Value Ref Range Status   SARS Coronavirus 2 NEGATIVE NEGATIVE Final    Comment: (NOTE) SARS-CoV-2 target nucleic acids are NOT DETECTED. The SARS-CoV-2 RNA is generally detectable in upper and lower respiratory specimens during the acute phase of infection. Negative results do not preclude SARS-CoV-2 infection, do not rule out co-infections with other pathogens, and should not be used as the sole basis for treatment or other patient management decisions. Negative results must be  combined with clinical observations, patient history, and epidemiological information. The expected result is Negative. Fact Sheet for Patients: SugarRoll.be Fact Sheet for Healthcare Providers: https://www.woods-mathews.com/ This test is not yet approved or cleared by the [place] FDA and  has been authorized for detection and/or diagnosis of SARS-CoV-2 by FDA under an Emergency Use Authorization (EUA). This EUA will remain  in effect (meaning this test can be used) for the duration of the COVID-19 declaration under Section 56 4(b)(1) of the Act, 21 U.S.C. section 360bbb-3(b)(1), unless the authorization is terminated or revoked sooner. Performed at [facility], [place] [address], [place], [place] [address]     Procedures and diagnostic studies:  Vas [place] Lower Extremity Venous (dvt)  Result Date: 10/24/2018  Lower Venous Study Indications: Edema.  Limitations: Poor ultrasound/tissue interface. Comparison Study: No prior study Performing Technologist: Sharion Dove RVS  Examination Guidelines: A complete evaluation includes B-mode imaging, spectral Doppler, color Doppler, and power Doppler as needed of all accessible portions of each vessel. Bilateral testing is considered an integral part of a complete examination. Limited examinations for reoccurring indications may be performed as noted.  +---------+---------------+---------+-----------+----------+--------------+ RIGHT    CompressibilityPhasicitySpontaneityPropertiesThrombus Aging +---------+---------------+---------+-----------+----------+--------------+ CFV      Full           Yes      Yes                                 +---------+---------------+---------+-----------+----------+--------------+ SFJ      Full                                                        +---------+---------------+---------+-----------+----------+--------------+ FV Prox  Full                                                         +---------+---------------+---------+-----------+----------+--------------+ FV Mid   Full                                                        +---------+---------------+---------+-----------+----------+--------------+ FV DistalFull                                                        +---------+---------------+---------+-----------+----------+--------------+ POP      Full           Yes      Yes                                 +---------+---------------+---------+-----------+----------+--------------+ PTV      Full                                                        +---------+---------------+---------+-----------+----------+--------------+ PERO     Full                                                        +---------+---------------+---------+-----------+----------+--------------+   +---------+---------------+---------+-----------+----------+--------------+ LEFT     CompressibilityPhasicitySpontaneityPropertiesThrombus Aging +---------+---------------+---------+-----------+----------+--------------+ CFV      Full           Yes      Yes                                 +---------+---------------+---------+-----------+----------+--------------+  SFJ      Full                                                        +---------+---------------+---------+-----------+----------+--------------+ FV Prox  Full                                                        +---------+---------------+---------+-----------+----------+--------------+ FV Mid   Full                                                        +---------+---------------+---------+-----------+----------+--------------+ FV DistalFull                                                        +---------+---------------+---------+-----------+----------+--------------+ POP      Full           Yes      Yes                                  +---------+---------------+---------+-----------+----------+--------------+ PTV      Full                                                        +---------+---------------+---------+-----------+----------+--------------+ PERO     Full                                                        +---------+---------------+---------+-----------+----------+--------------+     Summary: Right: There is no evidence of deep vein thrombosis in the lower extremity. No cystic structure found in the popliteal fossa. Left: There is no evidence of deep vein thrombosis in the lower extremity. No cystic structure found in the popliteal fossa.  *See table(s) above for measurements and observations. Electronically signed by Deitra Mayo MD on 10/24/2018 at 10:49:50 AM.    Final     Medications:   . busPIRone  15 mg Oral BID  . chlorhexidine  15 mL Mouth Rinse BID  . lamoTRIgine  100 mg Oral BID  . levothyroxine  100 mcg Oral QAC breakfast  . mouth rinse  15 mL Mouth Rinse q12n4p  . metoprolol tartrate  25 mg Oral BID  . pantoprazole  40 mg Oral Daily  . QUEtiapine  150 mg Oral QHS  . sodium chloride flush  3 mL Intravenous Once  . verapamil  120 mg Oral QHS  . warfarin  5 mg Oral ONCE-1800  . Warfarin -  Pharmacist Dosing Inpatient   Does not apply q1800   Continuous Infusions:   LOS: 2 days   Geradine Girt  [facility]   How to contact the [facility] Attending or Consulting provider [place] or covering provider during after hours [place], for this patient?  1. Check the care team in [facility] and look for a) attending/consulting TRH provider listed and b) the [facility] team listed 2. Log into [email or web address] and use [place]'s universal password to access. If you do not have the password, please contact the hospital operator. 3. Locate the [facility] provider you are looking for under [facility] and page to a number that you can be directly reached. 4. If you still have difficulty reaching the  provider, please page the [facility] (Director on Call) for the Hospitalists listed on amion for assistance.  10/25/2018, 2:42 PM

## 2018-10-25 NOTE — Progress Notes (Signed)
ANTICOAGULATION CONSULT NOTE - Initial Consult  Pharmacy Consult for warfarin Indication: atrial fibrillation  Vital Signs: Temp: 99 F (37.2 C) (08/29 0700) Temp Source: Axillary (08/29 0700) BP: 136/75 (08/29 0700) Pulse Rate: 78 (08/29 0700)  Labs: Recent Labs    10/22/18 1935 10/23/18 0414 10/23/18 0416 10/24/18 0418 10/25/18 0612  HGB 13.1  --   --   --  13.6  HCT 40.3 36.0  --   --  39.6  PLT 303  --   --   --  249  LABPROT 24.7*  --  24.7* 22.5* 18.6*  INR 2.3*  --  2.3* 2.0* 1.6*  CREATININE 1.25*  --   --  1.05* 1.01*  CKTOTAL  --  54  --   --   --     Estimated Creatinine Clearance: 49.4 mL/min (A) (by C-G formula based on SCr of 1.01 mg/dL (H)).   Medical History: Past Medical History:  Diagnosis Date  . Anxiety   . Atrial fibrillation ([place])   . Bipolar 2 disorder ([place])   . Depression   . History of cardioversion    x2  . Hyperlipidemia   . Hypothyroidism   . Migraine headache   . Osteoporosis     Assessment: 4 yof presented to the ED with AMS. She is on chronic warfarin for history of afib. INR is subtherapeutic at 1.6. CBC is WNL and no bleeding noted. Patient did not receive dose on 8/27 or 8/28. Dose was charted as not given due to AMS. Spoke with doc and patient looks much better today hopefully will be able to take, if not will consider lovenox tomorrow  PTA dose: 2.5mg  daily except 5mg  on Fridays  Goal of Therapy:  INR 2-3 Monitor platelets by anticoagulation protocol: Yes   Plan:  Warfarin 5 mg PO x 1 tonight if patient able to take PO Daily INR  Nicoletta Dress, PharmD PGY2 Infectious Disease Pharmacy Resident  [place] Please utilize Amion for appropriate phone number to reach the unit pharmacist ([place])   10/25/2018,11:01 AM

## 2018-10-25 NOTE — Progress Notes (Signed)
  Speech Language Pathology Treatment: Dysphagia  Patient Details Name: Meredith [name] MRN: [id number] DOB: [birth year] Today's Date: 10/25/2018 Time: 1530-1550 SLP Time Calculation (min) (ACUTE ONLY): 20 min  Assessment / Plan / Recommendation Clinical Impression  Patient seen to address dysphagia goals with spouse present in room. Of note, SLP from initial evaluation noted that patient was not accepting boluses and was confused, recommending NPO except for ice chips if she became more alert. Regular solids, thin liquids diet order was put in by MD. Per RN, she ate lunch today without any swallowing difficulty, but did seem to "forget" that she was eating at times, and RN feels she would benefit from full supervision with meals. SLP observed patient with straw sips of thin liquids and she did not exhibit any overt s/s of aspiration or penetration, but did intermittently lose focus on what she was doing. When SLP asked her what she ate for lunch, she could not recall/retell anything and exhibited a lot of circumlocution without getting to the point. "Well, we had, lets see...." SLP in agreement with RN that patient needs full supervision and cues as needed to safely eat secondary to decreased attention and awareness. SLP in agreement with Regular solids diet, thin liquids at this time.     HPI HPI: Meredith [name] is a 68 y.o. female with medical history significant for bipolar disorder, anxiety, atrial fibrillation, hyperlipidemia, hypothyroidism, and chronic migraine, presenting to the emergency department for evaluation of altered mental status. Per chart family has noted a couple week decline in cognition, strength and significantly decreased po  intake. Chest x-ray is negative for acute cardiopulmonary disease. CT originally detected cerebellar infarct however MRI 8/27 revealed no acute finding and that left cerebellar low-density on prior head CT was artifactual.       SLP Plan  Continue with  current plan of care       Recommendations  Diet recommendations: Regular;Thin liquid Liquids provided via: Cup;Straw Medication Administration: Whole meds with liquid Supervision: Patient able to self feed;Full supervision/cueing for compensatory strategies Compensations: Minimize environmental distractions;Slow rate;Small sips/bites Postural Changes and/or Swallow Maneuvers: Seated upright 90 degrees                Oral Care Recommendations: Oral care BID Follow up Recommendations: 24 hour supervision/assistance SLP Visit Diagnosis: Dysphagia, unspecified (R13.10) Plan: Continue with current plan of care       GO                Dannial Monarch 10/25/2018, 5:15 PM   [name], MA, CCC-SLP Speech Therapy [facility]

## 2018-10-25 NOTE — Progress Notes (Signed)
Pt is disoriented X4, answers her name when called but unable to pronounce her name. Pt is unable to participate in her care and refused sips of water because of cognitive impairment. Will continue monitoring the patient.

## 2018-10-25 NOTE — TOC Initial Note (Signed)
Transition of Care [facility]) - Initial/Assessment Note    Patient Details  Name: Meredith [name] MRN: [id number] Date of Birth: 06/20/1950  Transition of Care [facility]) CM/SW Contact:    Gelene Mink, [place] Phone Number: 10/25/2018, 1:07 PM  Clinical Narrative:                  CSW called the patient's brother, Meredith [name], and introduced herself and explained her role. CSW shared the therapy recommendation. He shared that he thought that would be best because he felt that his sister is in no shape to care for herself at this time.  CSW explained the SNF process.  He stated he has heard of [facility] and it is close to his and her home. He stated that he did not know about the facilities in the area. CSW obtained permission to send him a CMS SNF list via email. CSW also obtained permission to fax the patient out and provide him with bed offers.   CSW will provide bed offers as they become available. The patient's PASRR is currently under manual review. CSW will fax the patient's clinicals to [facility].   CSW will continue to follow and assist with discharge planning.   Expected Discharge Plan: Skilled Nursing Facility Barriers to Discharge: Continued Medical Work up   Patient Goals and CMS Choice Patient states their goals for this hospitalization and ongoing recovery are:: Pt brother is agreeable to her going to rehab before discharging home CMS Medicare.gov Compare Post Acute Care list provided to:: Patient Represenative (must comment) Choice offered to / list presented to : Sibling  Expected Discharge Plan and Services Expected Discharge Plan: [place] In-house Referral: Clinical Social Work Discharge Planning Services: NA Post Acute Care Choice: [place] Living arrangements for the past 2 months: Apartment                 DME Arranged: N/A DME Agency: NA       HH Arranged: NA [place] Agency: NA        Prior Living Arrangements/Services Living  arrangements for the past 2 months: Apartment Lives with:: Self Patient language and need for interpreter reviewed:: No Do you feel safe going back to the place where you live?: No   Pt needs rehab before discharging home  Need for Family Participation in Patient Care: No (Comment) Care giver support system in place?: Yes (comment)   Criminal Activity/Legal Involvement Pertinent to Current Situation/Hospitalization: No - Comment as needed  Activities of Daily Living Home Assistive Devices/Equipment: Eyeglasses, Gilford Rile (specify type) ADL Screening (condition at time of admission) Patient's cognitive ability adequate to safely complete daily activities?: No Is the patient deaf or have difficulty hearing?: No Does the patient have difficulty seeing, even when wearing glasses/contacts?: No Does the patient have difficulty concentrating, remembering, or making decisions?: Yes Patient able to express need for assistance with ADLs?: No Does the patient have difficulty dressing or bathing?: Yes Independently performs ADLs?: No Communication: Independent Is this a change from baseline?: Pre-admission baseline Dressing (OT): Needs assistance Is this a change from baseline?: Change from baseline, expected to last <3days Grooming: Needs assistance Is this a change from baseline?: Change from baseline, expected to last <3 days Feeding: Needs assistance Is this a change from baseline?: Change from baseline, expected to last <3 days Bathing: Needs assistance Is this a change from baseline?: Change from baseline, expected to last <3 days Toileting: Needs assistance Is this a change from baseline?:  Change from baseline, expected to last <3 days In/Out Bed: Needs assistance Is this a change from baseline?: Change from baseline, expected to last <3 days Walks in Home: Needs assistance Is this a change from baseline?: Change from baseline, expected to last <3 days Does the patient have difficulty  walking or climbing stairs?: Yes Weakness of Legs: Both Weakness of Arms/Hands: None  Permission Sought/Granted Permission sought to share information with : Case Manager Permission granted to share information with : Yes, Verbal Permission Granted  Share Information with NAME: Meredith [name]  Permission granted to share info w AGENCY: All SNF  Permission granted to share info w Relationship: Brother     Emotional Assessment Appearance:: Appears stated age Attitude/Demeanor/Rapport: Unable to Assess Affect (typically observed): Unable to Assess Orientation: : Oriented to Self Alcohol / Substance Use: Not Applicable Psych Involvement: No (comment)  Admission diagnosis:  Altered mental status, unspecified altered mental status type [R41.82] Cerebrovascular accident (CVA), unspecified mechanism ([place]) [I63.9] Patient Active Problem List   Diagnosis Date Noted  . Acute encephalopathy   . Acute ischemic stroke ([place]) 10/22/2018  . Renal insufficiency 10/22/2018  . Pain and swelling of right lower leg 10/22/2018  . Hypothyroidism 02/24/2018  . Encounter for therapeutic drug monitoring 01/03/2018  . PTSD (post-traumatic stress disorder) 11/29/2017  . Falls 11/25/2017  . Disorientation   . Delirium 11/24/2017  . Stiffness of finger joint of right hand 06/18/2017  . Osteoarthritis of carpometacarpal (CMC) joint of thumb 05/21/2017  . Anxiety 11/29/2016  . Chronic pain syndrome 07/30/2016  . Bipolar disorder, curr episode mixed, severe, with psychotic features ([place]) 07/28/2016  . PAF (paroxysmal atrial fibrillation) ([place]) 11/14/2015  . Intractable chronic migraine without aura 04/24/2012   PCP:  Kristen Loader, FNP Pharmacy:   [facility], [place] [address] Phone: [phone] Fax: [place] [address], [place] - [place] [address] [place] [place] [address] Phone: [phone] Fax: [place] [address] [place] [address] Phone: [phone] Fax: [phone]  [id number] [place], [place] [address] Phone: [phone] Fax: [phone]     Social Determinants of Health (SDOH) Interventions    Readmission Risk Interventions No flowsheet data found.

## 2018-10-25 NOTE — Plan of Care (Signed)
Casimer Leek RN

## 2018-10-26 LAB — PROTIME-INR
INR: 1.4 — ABNORMAL HIGH (ref 0.8–1.2)
Prothrombin Time: 17.3 seconds — ABNORMAL HIGH (ref 11.4–15.2)

## 2018-10-26 MED ORDER — WARFARIN SODIUM 5 MG PO TABS
5.0000 mg | ORAL_TABLET | Freq: Once | ORAL | Status: AC
  Administered 2018-10-26: 5 mg via ORAL
  Filled 2018-10-26: qty 1

## 2018-10-26 NOTE — Progress Notes (Signed)
ANTICOAGULATION CONSULT NOTE - Initial Consult  Pharmacy Consult for warfarin Indication: atrial fibrillation  Vital Signs: Temp: 97.7 F (36.5 C) (08/30 0742) Temp Source: Oral (08/30 0742) BP: 132/66 (08/30 0742) Pulse Rate: 86 (08/30 0742)  Labs: Recent Labs    10/24/18 0418 10/25/18 0612 10/26/18 0549  HGB  --  13.6  --   HCT  --  39.6  --   PLT  --  249  --   LABPROT 22.5* 18.6* 17.3*  INR 2.0* 1.6* 1.4*  CREATININE 1.05* 1.01*  --     Estimated Creatinine Clearance: 49.4 mL/min (A) (by C-G formula based on SCr of 1.01 mg/dL (H)).   Medical History: Past Medical History:  Diagnosis Date  . Anxiety   . Atrial fibrillation ([place])   . Bipolar 2 disorder ([place])   . Depression   . History of cardioversion    x2  . Hyperlipidemia   . Hypothyroidism   . Migraine headache   . Osteoporosis     Assessment: 75 yof presented to the ED with AMS. She is on chronic warfarin for history of afib. INR is subtherapeutic at 1.6. CBC is WNL and no bleeding noted. Patient did not receive dose on 8/27 or 8/28. Dose was charted as not given due to AMS. Patient looks much better per MD on 8/29 and was able to take her dose. If unable to take PO meds, consider Lovenox. PTA dose: 2.5mg  daily except 5mg  on Fridays  INR subtherapeutic at 1.4. Hgb 13.6, Plts 249   Goal of Therapy:  INR 2-3 Monitor platelets by anticoagulation protocol: Yes   Plan:  Warfarin 5 mg PO x 1 tonight  Daily INR  Lorel Monaco, PharmD PGY1 Ambulatory Care Resident [employer] # [phone]

## 2018-10-26 NOTE — Progress Notes (Signed)
Progress Note    Meredith [name]  [id number] DOB: [birth year]  DOA: 10/22/2018 PCP: Kristen Loader, FNP    Brief Narrative:     Medical records reviewed and are as summarized below:  Meredith [name] is an 68 y.o. female with medical history significant for bipolar disorder, atrial fibrillation on Coumadin, hyperlipidemia, hypothyroidism, and chronic migraine, presenting to the emergency department for evaluation of altered mental status.  Work up in process but appears mentation could be related to lithium toxicity.  Assessment/Plan:   Principal Problem:   Acute encephalopathy Active Problems:   PAF (paroxysmal atrial fibrillation) (HCC)   Bipolar disorder, curr episode mixed, severe, with psychotic features (HCC)   Chronic pain syndrome   Anxiety   Hypothyroidism   Acute ischemic stroke (HCC)   Renal insufficiency   Pain and swelling of right lower leg  1.acute encephalopathy due to lithium toxicity  -Presented with expressive aphasia, recurrent falls, and CT concerning for stroke but MRI revealed no acute infarct noting ct findings artifact -Reportedly has not taken meds for several days. -Ammonia, TSH, CK all within limits of normal. Vit B12 within limits of normal. -EEG did not show seizure like activity -per chart review: Dr. Clovis Pu: I have spoken to the neurologist taking care of her.  We both agree that she likely had lithium toxicity and it should be expected to clear shortly -PT/OT consult- SNF -8/29 patient seems much improved this AM and is able to take in PO 8/30 mental status continues to improve  2.Paroxysmal atrial fibrillationIn sinus rhythm on admission. Home meds include coumadin and metoprolol. CHADS-VASc is at least 4 (CVA x2, age, gender).INR is therapeutic at 2.3 on admission -coumadin per pharmacy   4.Mild renal insufficiencySCr is 1.25 on admission, was 1.32 three days ago but <1 prior to that. Likely related to decreased oral  intake. -hold nephrotoxins -Cr improved  5.HypothyroidismTSH within limits of normal.  - continue Synthroid  6.Bipolar disorder -slowly resume home meds (sans lithium)  7.Chronic pain -now that she is taking PO, slowly resume home meds  8. Right calf swelling and tenderness   -duplex negative  Family Communication/Anticipated D/C date and plan/Code Status   DVT prophylaxis: coumadin Code Status: Full Code.  Family Communication: brother at bedside Disposition Plan: SNF   Medical Consultants:    Neurology  Subjective:   Much more awake- interacting with her brother at bedside  Objective:    Vitals:   10/25/18 2000 10/26/18 0540 10/26/18 0742 10/26/18 1128  BP: (!) 151/76 139/79 132/66 133/84  Pulse: 85 84 86 73  Resp: 17  14 16   Temp: 97.9 F (36.6 C) 99.5 F (37.5 C) 97.7 F (36.5 C) 98.3 F (36.8 C)  TempSrc: Oral Oral Oral Oral  SpO2: 96% 99% 100% 97%    Intake/Output Summary (Last 24 hours) at 10/26/2018 1322 Last data filed at 10/25/2018 1748 Gross per 24 hour  Intake 40 ml  Output 300 ml  Net -260 ml   There were no vitals filed for this visit.  Exam: Awake but speech slow at times rrr No wheezing No LE edema   Data Reviewed:   I have personally reviewed following labs and imaging studies:  Labs: Labs show the following:   Basic Metabolic Panel: Recent Labs  Lab 10/19/18 2036 10/22/18 1935 10/24/18 0418 10/25/18 0612  NA 136 140 144 144  K 3.9 4.6 4.0 3.2*  CL 103 108 115* 110  CO2 23 19* 18* 22  GLUCOSE 105* 143* 115* 122*  BUN 18 23 22 19   CREATININE 1.32* 1.25* 1.05* 1.01*  CALCIUM 9.7 10.2 9.4 9.6   GFR Estimated Creatinine Clearance: 49.4 mL/min (A) (by C-G formula based on SCr of 1.01 mg/dL (H)). Liver Function Tests: Recent Labs  Lab 10/19/18 2036 10/22/18 1935  AST 20 17  ALT 14 14  ALKPHOS 73 78  BILITOT 0.4 0.6  PROT 7.2 7.1  ALBUMIN 4.3 4.6   No results for input(s): LIPASE, AMYLASE in the  last 168 hours. Recent Labs  Lab 10/23/18 0414  AMMONIA 17   Coagulation profile Recent Labs  Lab 10/22/18 1935 10/23/18 0416 10/24/18 0418 10/25/18 0612 10/26/18 0549  INR 2.3* 2.3* 2.0* 1.6* 1.4*    CBC: Recent Labs  Lab 10/19/18 2036 10/22/18 1935 10/23/18 0414 10/25/18 0612  WBC 9.8 11.1*  --  9.6  NEUTROABS 7.1 9.4*  --   --   HGB 12.2 13.1  --  13.6  HCT 36.9 40.3 36.0 39.6  MCV 90.4 92.0  --  88.2  PLT 297 303  --  249   Cardiac Enzymes: Recent Labs  Lab 10/20/18 0126 10/23/18 0414  CKTOTAL 156 54   BNP (last 3 results) No results for input(s): PROBNP in the last 8760 hours. CBG: Recent Labs  Lab 10/22/18 1916  GLUCAP 138*   D-Dimer: No results for input(s): DDIMER in the last 72 hours. Hgb A1c: No results for input(s): HGBA1C in the last 72 hours. Lipid Profile: No results for input(s): CHOL, HDL, LDLCALC, TRIG, CHOLHDL, LDLDIRECT in the last 72 hours. Thyroid function studies: No results for input(s): TSH, T4TOTAL, T3FREE, THYROIDAB in the last 72 hours.  Invalid input(s): FREET3 Anemia work up: No results for input(s): VITAMINB12, FOLATE, FERRITIN, TIBC, IRON, RETICCTPCT in the last 72 hours. Sepsis Labs: Recent Labs  Lab 10/19/18 2036 10/22/18 1935 10/25/18 0612  WBC 9.8 11.1* 9.6  LATICACIDVEN  --  1.7  --     Microbiology Recent Results (from the past 240 hour(s))  SARS CORONAVIRUS 2 (TAT 6-12 HRS) Nasal Swab Aptima Multi Swab     Status: None   Collection Time: 10/22/18 10:35 PM   Specimen: Aptima Multi Swab; Nasal Swab  Result Value Ref Range Status   SARS Coronavirus 2 NEGATIVE NEGATIVE Final    Comment: (NOTE) SARS-CoV-2 target nucleic acids are NOT DETECTED. The SARS-CoV-2 RNA is generally detectable in upper and lower respiratory specimens during the acute phase of infection. Negative results do not preclude SARS-CoV-2 infection, do not rule out co-infections with other pathogens, and should not be used as the sole  basis for treatment or other patient management decisions. Negative results must be combined with clinical observations, patient history, and epidemiological information. The expected result is Negative. Fact Sheet for Patients: SugarRoll.be Fact Sheet for Healthcare Providers: https://www.woods-mathews.com/ This test is not yet approved or cleared by the [place] FDA and  has been authorized for detection and/or diagnosis of SARS-CoV-2 by FDA under an Emergency Use Authorization (EUA). This EUA will remain  in effect (meaning this test can be used) for the duration of the COVID-19 declaration under Section 56 4(b)(1) of the Act, 21 U.S.C. section 360bbb-3(b)(1), unless the authorization is terminated or revoked sooner. Performed at [facility], [place] [address], [place], [place] [address]     Procedures and diagnostic studies:  No results found.  Medications:   . busPIRone  15 mg Oral BID  . chlorhexidine  15 mL Mouth  Rinse BID  . lamoTRIgine  100 mg Oral BID  . levothyroxine  100 mcg Oral QAC breakfast  . mouth rinse  15 mL Mouth Rinse q12n4p  . metoprolol tartrate  25 mg Oral BID  . pantoprazole  40 mg Oral Daily  . QUEtiapine  150 mg Oral QHS  . sodium chloride flush  3 mL Intravenous Once  . verapamil  120 mg Oral QHS  . warfarin  5 mg Oral ONCE-1800  . Warfarin - Pharmacist Dosing Inpatient   Does not apply q1800   Continuous Infusions:   LOS: 3 days   Meredith [name]  [facility]   How to contact the [facility] Attending or Consulting provider [place] or covering provider during after hours [place], for this patient?  1. Check the care team in [facility] and look for a) attending/consulting TRH provider listed and b) the [facility] team listed 2. Log into [email or web address] and use [place]'s universal password to access. If you do not have the password, please contact the hospital operator. 3. Locate the [facility] provider you are  looking for under [facility] and page to a number that you can be directly reached. 4. If you still have difficulty reaching the provider, please page the [facility] (Director on Call) for the Hospitalists listed on amion for assistance.  10/26/2018, 1:22 PM

## 2018-10-27 DIAGNOSIS — T56891A Toxic effect of other metals, accidental (unintentional), initial encounter: Secondary | ICD-10-CM

## 2018-10-27 LAB — BASIC METABOLIC PANEL
Anion gap: 12 (ref 5–15)
BUN: 17 mg/dL (ref 8–23)
CO2: 21 mmol/L — ABNORMAL LOW (ref 22–32)
Calcium: 9.1 mg/dL (ref 8.9–10.3)
Chloride: 107 mmol/L (ref 98–111)
Creatinine, Ser: 1.06 mg/dL — ABNORMAL HIGH (ref 0.44–1.00)
GFR calc Af Amer: >60 mL/min (ref >60)
GFR calc non Af Amer: 54 mL/min — ABNORMAL LOW (ref >60)
Glucose, Bld: 107 mg/dL — ABNORMAL HIGH (ref 70–99)
Potassium: 3.4 mmol/L — ABNORMAL LOW (ref 3.5–5.1)
Sodium: 140 mmol/L (ref 135–145)

## 2018-10-27 LAB — GLUCOSE, CAPILLARY
Glucose-Capillary: 125 mg/dL — ABNORMAL HIGH (ref 70–99)
Glucose-Capillary: 99 mg/dL (ref 70–99)
Glucose-Capillary: 97 mg/dL (ref 70–99)

## 2018-10-27 LAB — CBC
HCT: 38.5 % (ref 36.0–46.0)
Hemoglobin: 12.9 g/dL (ref 12.0–15.0)
MCH: 29.8 pg (ref 26.0–34.0)
MCHC: 33.5 g/dL (ref 30.0–36.0)
MCV: 88.9 fL (ref 80.0–100.0)
Platelets: 235 10*3/uL (ref 150–400)
RBC: 4.33 MIL/uL (ref 3.87–5.11)
RDW: 14.1 % (ref 11.5–15.5)
WBC: 7.3 10*3/uL (ref 4.0–10.5)
nRBC: 0 % (ref 0.0–0.2)

## 2018-10-27 LAB — PROTIME-INR
INR: 1.8 — ABNORMAL HIGH (ref 0.8–1.2)
Prothrombin Time: 20.5 seconds — ABNORMAL HIGH (ref 11.4–15.2)

## 2018-10-27 MED ORDER — POTASSIUM CHLORIDE CRYS ER 20 MEQ PO TBCR
40.0000 meq | EXTENDED_RELEASE_TABLET | Freq: Once | ORAL | Status: AC
  Administered 2018-10-27: 40 meq via ORAL
  Filled 2018-10-27: qty 2

## 2018-10-27 MED ORDER — WARFARIN SODIUM 5 MG PO TABS
5.0000 mg | ORAL_TABLET | Freq: Once | ORAL | Status: AC
  Administered 2018-10-27: 5 mg via ORAL
  Filled 2018-10-27: qty 1

## 2018-10-27 NOTE — Care Management Important Message (Signed)
Important Message  Patient Details  Name: Meredith [name] MRN: [id number] Date of Birth: [birth year]   Medicare Important Message Given:  Yes     Orbie Pyo 10/27/2018, 1:36 PM

## 2018-10-27 NOTE — Progress Notes (Signed)
Progress Note    Meredith [name]  [id number] DOB: 1951/01/24  DOA: 10/22/2018 PCP: Kristen Loader, FNP    Brief Narrative:     Medical records reviewed and are as summarized below:  Meredith [name] is an 68 y.o. female with medical history significant for bipolar disorder, atrial fibrillation on Coumadin, hyperlipidemia, hypothyroidism, and chronic migraine, presenting to the emergency department for evaluation of altered mental status.  Work up in process but appears mentation could be related to lithium toxicity.  Assessment/Plan:   Principal Problem:   Acute encephalopathy Active Problems:   PAF (paroxysmal atrial fibrillation) (HCC)   Bipolar disorder, curr episode mixed, severe, with psychotic features (HCC)   Chronic pain syndrome   Anxiety   Hypothyroidism   Acute ischemic stroke (HCC)   Renal insufficiency   Pain and swelling of right lower leg  1.acute encephalopathy due to lithium toxicity  -Presented with expressive aphasia, recurrent falls, and CT concerning for stroke but MRI revealed no acute infarct noting ct findings artifact -Reportedly has not taken meds for several days. -Ammonia, TSH, CK all within limits of normal. Vit B12 within limits of normal. -EEG did not show seizure like activity -per chart review: Dr. Clovis Pu: I have spoken to the neurologist taking care of her.  We both agree that she likely had lithium toxicity and it should be expected to clear shortly -PT/OT consult- SNF -8/29 patient seems much improved this AM and is able to take in PO 8/30 mental status continues to improve 8/31 stable  2.Paroxysmal atrial fibrillationIn sinus rhythm on admission. Home meds include coumadin and metoprolol. CHADS-VASc is at least 4 (CVA x2, age, gender).INR was therapeutic at 2.3 on admission -coumadin per pharmacy   4.Mild renal insufficiencySCr is 1.25 on admission, was 1.32 three days ago but <1 prior to that. Likely related to  decreased oral intake. -hold nephrotoxins -Cr improved  5.HypothyroidismTSH within limits of normal.  - continue Synthroid  6.Bipolar disorder -slowly resume home meds (sans lithium)  7.Chronic pain -now that she is taking PO, slowly resume home meds  8. Right calf swelling and tenderness   -duplex negative  9. Hypokalemia -replete  Family Communication/Anticipated D/C date and plan/Code Status   DVT prophylaxis: coumadin Code Status: Full Code.  Family Communication: brother at bedside 8/30 Disposition Plan: SNF   Medical Consultants:    Neurology  Subjective:   Did not eat much breakfast-- said it was too sweet  Objective:    Vitals:   10/27/18 0352 10/27/18 0353 10/27/18 0743 10/27/18 1109  BP: 114/70 114/70 130/69 123/74  Pulse: 85 84 88 78  Resp: 16  (!) 24 20  Temp: 98.4 F (36.9 C) 98.4 F (36.9 C) 97.7 F (36.5 C) 98.5 F (36.9 C)  TempSrc: Oral Oral Oral Oral  SpO2: 99% 99% 98% 99%   No intake or output data in the 24 hours ending 10/27/18 1330 There were no vitals filed for this visit.  Exam: Awake, interactive rrr No increased work of breathing Appropriate Bruise above right eye    Data Reviewed:   I have personally reviewed following labs and imaging studies:  Labs: Labs show the following:   Basic Metabolic Panel: Recent Labs  Lab 10/22/18 1935 10/24/18 0418 10/25/18 0612 10/27/18 0440  NA 140 144 144 140  K 4.6 4.0 3.2* 3.4*  CL 108 115* 110 107  CO2 19* 18* 22 21*  GLUCOSE 143* 115* 122* 107*  BUN 23 22 19  17  CREATININE 1.25* 1.05* 1.01* 1.06*  CALCIUM 10.2 9.4 9.6 9.1   GFR Estimated Creatinine Clearance: 47.1 mL/min (A) (by C-G formula based on SCr of 1.06 mg/dL (H)). Liver Function Tests: Recent Labs  Lab 10/22/18 1935  AST 17  ALT 14  ALKPHOS 78  BILITOT 0.6  PROT 7.1  ALBUMIN 4.6   No results for input(s): LIPASE, AMYLASE in the last 168 hours. Recent Labs  Lab 10/23/18 0414  AMMONIA  17   Coagulation profile Recent Labs  Lab 10/23/18 0416 10/24/18 0418 10/25/18 0612 10/26/18 0549 10/27/18 0440  INR 2.3* 2.0* 1.6* 1.4* 1.8*    CBC: Recent Labs  Lab 10/22/18 1935 10/23/18 0414 10/25/18 0612 10/27/18 0440  WBC 11.1*  --  9.6 7.3  NEUTROABS 9.4*  --   --   --   HGB 13.1  --  13.6 12.9  HCT 40.3 36.0 39.6 38.5  MCV 92.0  --  88.2 88.9  PLT 303  --  249 235   Cardiac Enzymes: Recent Labs  Lab 10/23/18 0414  CKTOTAL 54   BNP (last 3 results) No results for input(s): PROBNP in the last 8760 hours. CBG: Recent Labs  Lab 10/22/18 1916 10/27/18 1125  GLUCAP 138* 125*   D-Dimer: No results for input(s): DDIMER in the last 72 hours. Hgb A1c: No results for input(s): HGBA1C in the last 72 hours. Lipid Profile: No results for input(s): CHOL, HDL, LDLCALC, TRIG, CHOLHDL, LDLDIRECT in the last 72 hours. Thyroid function studies: No results for input(s): TSH, T4TOTAL, T3FREE, THYROIDAB in the last 72 hours.  Invalid input(s): FREET3 Anemia work up: No results for input(s): VITAMINB12, FOLATE, FERRITIN, TIBC, IRON, RETICCTPCT in the last 72 hours. Sepsis Labs: Recent Labs  Lab 10/22/18 1935 10/25/18 0612 10/27/18 0440  WBC 11.1* 9.6 7.3  LATICACIDVEN 1.7  --   --     Microbiology Recent Results (from the past 240 hour(s))  SARS CORONAVIRUS 2 (TAT 6-12 HRS) Nasal Swab Aptima Multi Swab     Status: None   Collection Time: 10/22/18 10:35 PM   Specimen: Aptima Multi Swab; Nasal Swab  Result Value Ref Range Status   SARS Coronavirus 2 NEGATIVE NEGATIVE Final    Comment: (NOTE) SARS-CoV-2 target nucleic acids are NOT DETECTED. The SARS-CoV-2 RNA is generally detectable in upper and lower respiratory specimens during the acute phase of infection. Negative results do not preclude SARS-CoV-2 infection, do not rule out co-infections with other pathogens, and should not be used as the sole basis for treatment or other patient management decisions.  Negative results must be combined with clinical observations, patient history, and epidemiological information. The expected result is Negative. Fact Sheet for Patients: SugarRoll.be Fact Sheet for Healthcare Providers: https://www.woods-mathews.com/ This test is not yet approved or cleared by the [place] FDA and  has been authorized for detection and/or diagnosis of SARS-CoV-2 by FDA under an Emergency Use Authorization (EUA). This EUA will remain  in effect (meaning this test can be used) for the duration of the COVID-19 declaration under Section 56 4(b)(1) of the Act, 21 U.S.C. section 360bbb-3(b)(1), unless the authorization is terminated or revoked sooner. Performed at [facility], [place] [address], [place],  [address]     Procedures and diagnostic studies:  No results found.  Medications:   . busPIRone  15 mg Oral BID  . chlorhexidine  15 mL Mouth Rinse BID  . lamoTRIgine  100 mg Oral BID  . levothyroxine  100 mcg  Oral QAC breakfast  . mouth rinse  15 mL Mouth Rinse q12n4p  . metoprolol tartrate  25 mg Oral BID  . pantoprazole  40 mg Oral Daily  . QUEtiapine  150 mg Oral QHS  . sodium chloride flush  3 mL Intravenous Once  . verapamil  120 mg Oral QHS  . warfarin  5 mg Oral ONCE-1800  . Warfarin - Pharmacist Dosing Inpatient   Does not apply q1800   Continuous Infusions:   LOS: 4 days   Geradine Girt  [facility]   How to contact the [facility] Attending or Consulting provider [place] or covering provider during after hours [place], for this patient?  1. Check the care team in [facility] and look for a) attending/consulting TRH provider listed and b) the [facility] team listed 2. Log into [email or web address] and use [place]'s universal password to access. If you do not have the password, please contact the hospital operator. 3. Locate the [facility] provider you are looking for under [facility] and page to a number  that you can be directly reached. 4. If you still have difficulty reaching the provider, please page the [facility] (Director on Call) for the Hospitalists listed on amion for assistance.  10/27/2018, 1:30 PM

## 2018-10-27 NOTE — Progress Notes (Signed)
ANTICOAGULATION CONSULT NOTE - Initial Consult  Pharmacy Consult for warfarin Indication: atrial fibrillation  Vital Signs: Temp: 97.7 F (36.5 C) (08/31 0743) Temp Source: Oral (08/31 0743) BP: 130/69 (08/31 0743) Pulse Rate: 88 (08/31 0743)  Labs: Recent Labs    10/25/18 0612 10/26/18 0549 10/27/18 0440  HGB 13.6  --  12.9  HCT 39.6  --  38.5  PLT 249  --  235  LABPROT 18.6* 17.3* 20.5*  INR 1.6* 1.4* 1.8*  CREATININE 1.01*  --  1.06*    Estimated Creatinine Clearance: 47.1 mL/min (A) (by C-G formula based on SCr of 1.06 mg/dL (H)).  Assessment: 45 yof presented to the ED with AMS. She is on chronic warfarin for history of afib. INR is subtherapeutic at 1.6. CBC is WNL and no bleeding noted.  PTA dose: 2.5mg  daily except 5mg  on Fridays  INR subtherapeutic at 1.8   Goal of Therapy:  INR 2-3 Monitor platelets by anticoagulation protocol: Yes   Plan:  Warfarin 5 mg PO x 1 tonight  Daily INR  Levester Fresh, PharmD, BCPS, BCCCP Clinical Pharmacist [phone]  Please check AMION for all [place] numbers  10/27/2018 10:12 AM

## 2018-10-28 DIAGNOSIS — N289 Disorder of kidney and ureter, unspecified: Secondary | ICD-10-CM | POA: Diagnosis not present

## 2018-10-28 DIAGNOSIS — G894 Chronic pain syndrome: Secondary | ICD-10-CM | POA: Diagnosis not present

## 2018-10-28 DIAGNOSIS — D508 Other iron deficiency anemias: Secondary | ICD-10-CM | POA: Diagnosis not present

## 2018-10-28 DIAGNOSIS — F3181 Bipolar II disorder: Secondary | ICD-10-CM | POA: Diagnosis not present

## 2018-10-28 DIAGNOSIS — F419 Anxiety disorder, unspecified: Secondary | ICD-10-CM | POA: Diagnosis not present

## 2018-10-28 DIAGNOSIS — R41841 Cognitive communication deficit: Secondary | ICD-10-CM | POA: Diagnosis not present

## 2018-10-28 DIAGNOSIS — F319 Bipolar disorder, unspecified: Secondary | ICD-10-CM | POA: Diagnosis not present

## 2018-10-28 DIAGNOSIS — G9349 Other encephalopathy: Secondary | ICD-10-CM | POA: Diagnosis not present

## 2018-10-28 DIAGNOSIS — T56891S Toxic effect of other metals, accidental (unintentional), sequela: Secondary | ICD-10-CM | POA: Diagnosis not present

## 2018-10-28 DIAGNOSIS — R5381 Other malaise: Secondary | ICD-10-CM | POA: Diagnosis not present

## 2018-10-28 DIAGNOSIS — G43809 Other migraine, not intractable, without status migrainosus: Secondary | ICD-10-CM | POA: Diagnosis not present

## 2018-10-28 DIAGNOSIS — G9340 Encephalopathy, unspecified: Secondary | ICD-10-CM | POA: Diagnosis not present

## 2018-10-28 DIAGNOSIS — F3164 Bipolar disorder, current episode mixed, severe, with psychotic features: Secondary | ICD-10-CM | POA: Diagnosis not present

## 2018-10-28 DIAGNOSIS — G8929 Other chronic pain: Secondary | ICD-10-CM | POA: Diagnosis not present

## 2018-10-28 DIAGNOSIS — E039 Hypothyroidism, unspecified: Secondary | ICD-10-CM | POA: Diagnosis not present

## 2018-10-28 DIAGNOSIS — E119 Type 2 diabetes mellitus without complications: Secondary | ICD-10-CM | POA: Diagnosis not present

## 2018-10-28 DIAGNOSIS — I480 Paroxysmal atrial fibrillation: Secondary | ICD-10-CM | POA: Diagnosis not present

## 2018-10-28 DIAGNOSIS — Z7401 Bed confinement status: Secondary | ICD-10-CM | POA: Diagnosis not present

## 2018-10-28 DIAGNOSIS — R2689 Other abnormalities of gait and mobility: Secondary | ICD-10-CM | POA: Diagnosis not present

## 2018-10-28 DIAGNOSIS — R278 Other lack of coordination: Secondary | ICD-10-CM | POA: Diagnosis not present

## 2018-10-28 DIAGNOSIS — R892 Abnormal level of other drugs, medicaments and biological substances in specimens from other organs, systems and tissues: Secondary | ICD-10-CM | POA: Diagnosis not present

## 2018-10-28 DIAGNOSIS — R531 Weakness: Secondary | ICD-10-CM | POA: Diagnosis not present

## 2018-10-28 DIAGNOSIS — R52 Pain, unspecified: Secondary | ICD-10-CM | POA: Diagnosis not present

## 2018-10-28 DIAGNOSIS — I4891 Unspecified atrial fibrillation: Secondary | ICD-10-CM | POA: Diagnosis not present

## 2018-10-28 DIAGNOSIS — M6281 Muscle weakness (generalized): Secondary | ICD-10-CM | POA: Diagnosis not present

## 2018-10-28 DIAGNOSIS — M2550 Pain in unspecified joint: Secondary | ICD-10-CM | POA: Diagnosis not present

## 2018-10-28 DIAGNOSIS — I639 Cerebral infarction, unspecified: Secondary | ICD-10-CM | POA: Diagnosis not present

## 2018-10-28 DIAGNOSIS — T56891A Toxic effect of other metals, accidental (unintentional), initial encounter: Secondary | ICD-10-CM | POA: Diagnosis not present

## 2018-10-28 DIAGNOSIS — I10 Essential (primary) hypertension: Secondary | ICD-10-CM | POA: Diagnosis not present

## 2018-10-28 DIAGNOSIS — F4310 Post-traumatic stress disorder, unspecified: Secondary | ICD-10-CM | POA: Diagnosis not present

## 2018-10-28 DIAGNOSIS — E785 Hyperlipidemia, unspecified: Secondary | ICD-10-CM | POA: Diagnosis not present

## 2018-10-28 LAB — GLUCOSE, CAPILLARY
Glucose-Capillary: 99 mg/dL (ref 70–99)
Glucose-Capillary: 102 mg/dL — ABNORMAL HIGH (ref 70–99)
Glucose-Capillary: 106 mg/dL — ABNORMAL HIGH (ref 70–99)

## 2018-10-28 LAB — PROTIME-INR
INR: 2.2 — ABNORMAL HIGH (ref 0.8–1.2)
Prothrombin Time: 24 seconds — ABNORMAL HIGH (ref 11.4–15.2)

## 2018-10-28 MED ORDER — WARFARIN SODIUM 5 MG PO TABS: 5.0000 mg | ORAL_TABLET | Freq: Once | ORAL | Status: DC

## 2018-10-28 MED ORDER — METOPROLOL TARTRATE 25 MG PO TABS: 25.0000 mg | ORAL_TABLET | Freq: Two times a day (BID) | ORAL | Status: DC

## 2018-10-28 NOTE — TOC Transition Note (Addendum)
Transition of Care [facility]) - CM/SW Discharge Note   Patient Details  Name: [name] MRN: [id number] Date of Birth: 04-08-1950  [facility] CM/SW Contact:  Geralynn Ochs, LCSW Phone Number: 10/28/2018, 12:11 PM   Clinical Narrative:   Nurse to call report to [phone], Room 3218.  Transport set for 2:30 PM.    Final next level of care: [place] Barriers to Discharge: Barriers Resolved   Patient Goals and CMS Choice Patient states their goals for this hospitalization and ongoing recovery are:: Pt brother is agreeable to her going to rehab before discharging home CMS Medicare.gov Compare Post Acute Care list provided to:: Patient Represenative (must comment) Choice offered to / list presented to : Sibling  Discharge Placement              Patient [facility] at[facility] Patient to be transferred to facility by: [place] Name of family member notified: [name] Patient and family notified of of transfer: 10/28/18  Discharge Plan and Services In-house Referral: Clinical Social Work Discharge Planning Services: NA Post Acute Care Choice: [place]          DME Arranged: N/A DME Agency: NA       HH Arranged: NA [place] Agency: NA        Social Determinants of Health (SDOH) Interventions     Readmission Risk Interventions No flowsheet data found.

## 2018-10-28 NOTE — Progress Notes (Addendum)
Pt d/c to [facility]. Pt is alert and oriented no new concerns. RN called facility to give hand over d/c report. Phone call went to voice mail. RN will call back later   14:17pm   Repeat phone call to [facility]. D/C instructions given to [facility]

## 2018-10-28 NOTE — Progress Notes (Signed)
ANTICOAGULATION CONSULT NOTE - Initial Consult  Pharmacy Consult for warfarin Indication: atrial fibrillation  Vital Signs: Temp: 97.9 F (36.6 C) (09/01 0817) Temp Source: Oral (09/01 0817) BP: 118/62 (09/01 0817) Pulse Rate: 81 (09/01 0817)  Labs: Recent Labs    10/26/18 0549 10/27/18 0440 10/28/18 0323  HGB  --  12.9  --   HCT  --  38.5  --   PLT  --  235  --   LABPROT 17.3* 20.5* 24.0*  INR 1.4* 1.8* 2.2*  CREATININE  --  1.06*  --     Estimated Creatinine Clearance: 47.1 mL/min (A) (by C-G formula based on SCr of 1.06 mg/dL (H)).  Assessment: 17 yof presented to the ED with AMS. She is on chronic warfarin for history of afib. CBC is WNL and no bleeding noted.  PTA dose: 2.5mg  daily except 5mg  on Fridays  INR therapeutic at 2.2 after receiving 5 mg yesterday  Goal of Therapy:  INR 2-3 Monitor platelets by anticoagulation protocol: Yes   Plan:  Warfarin 5 mg PO x 1 tonight  Daily INR, s/sx of bleeding  Berenice Bouton, PharmD PGY1 Pharmacy Resident Office phone: [phone] 10/28/2018 9:58 AM

## 2018-10-28 NOTE — Discharge Summary (Signed)
Physician Discharge Summary  Meredith [name] [id number] DOB: 06/20/1950 DOA: 10/22/2018  PCP: Kristen Loader, FNP  Admit date: 10/22/2018 Discharge date: 10/28/2018  Admitted From: home Discharge disposition: SNF   Recommendations for Outpatient Follow-Up:   1. Lithium on hold resumed by psychiatry 2. INR check on Monday    Discharge Diagnosis:   Principal Problem:   Acute encephalopathy Active Problems:   PAF (paroxysmal atrial fibrillation) (HCC)   Bipolar disorder, curr episode mixed, severe, with psychotic features (HCC)   Chronic pain syndrome   Anxiety   Hypothyroidism   Acute ischemic stroke [facility])   Renal insufficiency   Pain and swelling of right lower leg   Lithium toxicity    Discharge Condition: Improved.  Diet recommendation:  Regular.  Wound care: None.  Code status: Full.   History of Present Illness:   Meredith [name] is a 68 y.o. female with medical history significant for bipolar disorder, atrial fibrillation on Coumadin, hyperlipidemia, hypothyroidism, and chronic migraine, presenting to the emergency department for evaluation of altered mental status.  Patient's family has noted a couple weeks of general decline in terms of her cognition and general functioning, seem to be weak in general and has had recurrent falls over this interval without any significant injury appreciated.  She has not been eating or drinking much for the past week but there were no specific complaints from the patient and no fevers or chills.  She was evaluated in the emergency department for this on the night of 10/19/2018, she had unremarkable UA, mild renal insufficiency, but otherwise unremarkable blood work, and she even had MRI brain that night that was negative for any acute findings.  She was having a lot of word finding difficulty and worsening gait abnormality today, has been reached out to the patient's psychiatrist and it was recommended that she go to the  nearest ED for evaluation including lithium level.   Hospital Course by Problem:   1.acute encephalopathy due to lithium toxicity -Presentedwith expressive aphasia, recurrent falls, and CT concerning for stroke but MRI revealed no acute infarct noting ct findings artifact -Reportedly has not taken meds for several days. -Ammonia, TSH, CK all within limits of normal. Vit B12 within limits of normal. -EEG did not show seizure like activity -per chart review: Dr. Clovis Pu: I have spoken to the neurologist taking care of her. We both agree that she likely had lithium toxicity and it should be expected to clear shortly -PT/OT consult- SNF -8/29 patient seems much improved this AM and is able to take in PO 8/30 mental status continues to improve daily   2.Paroxysmal atrial fibrillationIn sinus rhythm on admission. Home meds include coumadin and metoprolol.CHADS-VASc is at least 4 (CVA x2, age, gender).INR was therapeutic at 2.3 on admission -coumadin resumed  4.Mild renal insufficiencySCr is 1.25 on admission, was 1.32 three days ago but <1 prior to that. Likely related to decreased oral intake. -hold nephrotoxins -Cr improved  5.HypothyroidismTSH within limits of normal.  -continue Synthroid  6.Bipolar disorder -slowly resume home meds (sans lithium)  7.Chronic pain -now that she is taking PO, slowly resume home meds as needed -not requiring any narcotics  8. Right calf swelling and tenderness  -duplex negative  9. Hypokalemia -replete    Medical Consultants:   neurology   Discharge Exam:   Vitals:   10/28/18 0336 10/28/18 0817  BP: 110/64 118/62  Pulse: 80 81  Resp: 16 (!) 21  Temp: 98.2 F (  36.8 C) 97.9 F (36.6 C)  SpO2: 99% 96%   Vitals:   10/27/18 2010 10/27/18 2340 10/28/18 0336 10/28/18 0817  BP: 137/74 117/60 110/64 118/62  Pulse: 88 69 80 81  Resp: 16 16 16  (!) 21  Temp: 98.4 F (36.9 C) 98.6 F (37 C) 98.2 F (36.8 C)  97.9 F (36.6 C)  TempSrc: Oral Oral Oral Oral  SpO2: 99% 98% 99% 96%    General exam: Appears calm and comfortable.   The results of significant diagnostics from this hospitalization (including imaging, microbiology, ancillary and laboratory) are listed below for reference.     Procedures and Diagnostic Studies:   Ct Head Wo Contrast  Result Date: 10/22/2018 CLINICAL DATA:  Altered mental status EXAM: CT HEAD WITHOUT CONTRAST TECHNIQUE: Contiguous axial images were obtained from the base of the skull through the vertex without intravenous contrast. COMPARISON:  MRI 10/20/2018 FINDINGS: Brain: Low-density in the left cerebellar hemisphere compatible with infarct. This was not present on prior MRI. No hemorrhage. No additional acute infarction. No hydrocephalus. Vascular: No hyperdense vessel or unexpected calcification. Skull: No acute calvarial abnormality. Sinuses/Orbits: Visualized paranasal sinuses and mastoids clear. Orbital soft tissues unremarkable. 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 Electronically Signed   By: Rolm Baptise M.D.   On: 10/22/2018 21:29   Mr Brain Wo Contrast  Result Date: 10/23/2018 CLINICAL DATA:  Stroke follow-up EXAM: MRI HEAD WITHOUT CONTRAST TECHNIQUE: Multiplanar, multiecho pulse sequences of the brain and surrounding structures were obtained without intravenous contrast. COMPARISON:  Brain MRI 3 days ago and head CT from yesterday FINDINGS: Brain: No acute infarction, hemorrhage, hydrocephalus, extra-axial collection or mass lesion. Mild central predominant atrophy. Vascular: Major flow voids are preserved Skull and upper cervical spine: Negative for marrow lesion Sinuses/Orbits: Negative IMPRESSION: No acute finding. Left cerebellar low-density on prior head CT was artifactual. Electronically Signed   By: Monte Fantasia M.D.   On: 10/23/2018 07:07   Dg Chest Portable 1 View  Result Date:  10/22/2018 CLINICAL DATA:  Confusion.  Sepsis. EXAM: PORTABLE CHEST 1 VIEW COMPARISON:  Chest x-ray dated March 22, 2018. FINDINGS: Patient is rotated to the right. The heart size and mediastinal contours are within normal limits. Normal pulmonary vascularity. No focal consolidation, pleural effusion, or pneumothorax. Unchanged elevation of the right hemidiaphragm. No acute osseous abnormality. Prior thoracic spine posterior fusion again noted. IMPRESSION: No active disease. Electronically Signed   By: Titus Dubin M.D.   On: 10/22/2018 20:21   Vas [place] Lower Extremity Venous (dvt)  Result Date: 10/24/2018  Lower Venous Study Indications: Edema.  Limitations: Poor ultrasound/tissue interface. Comparison Study: No prior study Performing Technologist: Sharion Dove RVS  Examination Guidelines: A complete evaluation includes B-mode imaging, spectral Doppler, color Doppler, and power Doppler as needed of all accessible portions of each vessel. Bilateral testing is considered an integral part of a complete examination. Limited examinations for reoccurring indications may be performed as noted.  +---------+---------------+---------+-----------+----------+--------------+  RIGHT     Compressibility Phasicity Spontaneity Properties Thrombus Aging  +---------+---------------+---------+-----------+----------+--------------+  CFV       Full            Yes       Yes                                    +---------+---------------+---------+-----------+----------+--------------+  SFJ       Full                                                             +---------+---------------+---------+-----------+----------+--------------+  FV Prox   Full                                                             +---------+---------------+---------+-----------+----------+--------------+  FV Mid    Full                                                              +---------+---------------+---------+-----------+----------+--------------+  FV Distal Full                                                             +---------+---------------+---------+-----------+----------+--------------+  POP       Full            Yes       Yes                                    +---------+---------------+---------+-----------+----------+--------------+  PTV       Full                                                             +---------+---------------+---------+-----------+----------+--------------+  PERO      Full                                                             +---------+---------------+---------+-----------+----------+--------------+   +---------+---------------+---------+-----------+----------+--------------+  LEFT      Compressibility Phasicity Spontaneity Properties Thrombus Aging  +---------+---------------+---------+-----------+----------+--------------+  CFV       Full            Yes       Yes                                    +---------+---------------+---------+-----------+----------+--------------+  SFJ       Full                                                             +---------+---------------+---------+-----------+----------+--------------+  FV Prox   Full                                                             +---------+---------------+---------+-----------+----------+--------------+  FV Mid    Full                                                             +---------+---------------+---------+-----------+----------+--------------+  FV Distal Full                                                             +---------+---------------+---------+-----------+----------+--------------+  POP       Full            Yes       Yes                                    +---------+---------------+---------+-----------+----------+--------------+  PTV       Full                                                              +---------+---------------+---------+-----------+----------+--------------+  PERO      Full                                                             +---------+---------------+---------+-----------+----------+--------------+     Summary: Right: There is no evidence of deep vein thrombosis in the lower extremity. No cystic structure found in the popliteal fossa. Left: There is no evidence of deep vein thrombosis in the lower extremity. No cystic structure found in the popliteal fossa.  *See table(s) above for measurements and observations. Electronically signed by Deitra Mayo MD on 10/24/2018 at 10:49:50 AM.    Final      Labs:   Basic Metabolic Panel: Recent Labs  Lab 10/22/18 1935 10/24/18 0418 10/25/18 0612 10/27/18 0440  NA 140 144 144 140  K 4.6 4.0 3.2* 3.4*  CL 108 115* 110 107  CO2 19* 18* 22 21*  GLUCOSE 143* 115* 122* 107*  BUN 23 22 19 17   CREATININE 1.25* 1.05* 1.01* 1.06*  CALCIUM 10.2 9.4 9.6 9.1   GFR Estimated Creatinine Clearance: 47.1 mL/min (A) (by C-G formula based on SCr of 1.06 mg/dL (H)). Liver Function Tests: Recent Labs  Lab 10/22/18 1935  AST 17  ALT 14  ALKPHOS 78  BILITOT 0.6  PROT 7.1  ALBUMIN 4.6   No results for input(s): LIPASE, AMYLASE in the last 168 hours. Recent Labs  Lab 10/23/18 0414  AMMONIA 17   Coagulation profile Recent Labs  Lab 10/24/18 0418 10/25/18 0612 10/26/18 0549 10/27/18 0440 10/28/18 0323  INR 2.0* 1.6* 1.4* 1.8* 2.2*    CBC: Recent Labs  Lab 10/22/18 1935 10/23/18 0414 10/25/18 0612 10/27/18 0440  WBC 11.1*  --  9.6 7.3  NEUTROABS 9.4*  --   --   --  HGB 13.1  --  13.6 12.9  HCT 40.3 36.0 39.6 38.5  MCV 92.0  --  88.2 88.9  PLT 303  --  249 235   Cardiac Enzymes: Recent Labs  Lab 10/23/18 0414  CKTOTAL 54   BNP: Invalid input(s): POCBNP CBG: Recent Labs  Lab 10/27/18 1125 10/27/18 1625 10/27/18 2108 10/28/18 0612 10/28/18 0810  GLUCAP 125* 99 97 99 102*   D-Dimer No  results for input(s): DDIMER in the last 72 hours. Hgb A1c No results for input(s): HGBA1C in the last 72 hours. Lipid Profile No results for input(s): CHOL, HDL, LDLCALC, TRIG, CHOLHDL, LDLDIRECT in the last 72 hours. Thyroid function studies No results for input(s): TSH, T4TOTAL, T3FREE, THYROIDAB in the last 72 hours.  Invalid input(s): FREET3 Anemia work up No results for input(s): VITAMINB12, FOLATE, FERRITIN, TIBC, IRON, RETICCTPCT in the last 72 hours. Microbiology Recent Results (from the past 240 hour(s))  SARS CORONAVIRUS 2 (TAT 6-12 HRS) Nasal Swab Aptima Multi Swab     Status: None   Collection Time: 10/22/18 10:35 PM   Specimen: Aptima Multi Swab; Nasal Swab  Result Value Ref Range Status   SARS Coronavirus 2 NEGATIVE NEGATIVE Final    Comment: (NOTE) SARS-CoV-2 target nucleic acids are NOT DETECTED. The SARS-CoV-2 RNA is generally detectable in upper and lower respiratory specimens during the acute phase of infection. Negative results do not preclude SARS-CoV-2 infection, do not rule out co-infections with other pathogens, and should not be used as the sole basis for treatment or other patient management decisions. Negative results must be combined with clinical observations, patient history, and epidemiological information. The expected result is Negative. Fact Sheet for Patients: SugarRoll.be Fact Sheet for Healthcare Providers: https://www.woods-mathews.com/ This test is not yet approved or cleared by the [place] FDA and  has been authorized for detection and/or diagnosis of SARS-CoV-2 by FDA under an Emergency Use Authorization (EUA). This EUA will remain  in effect (meaning this test can be used) for the duration of the COVID-19 declaration under Section 56 4(b)(1) of the Act, 21 U.S.C. section 360bbb-3(b)(1), unless the authorization is terminated or revoked sooner. Performed at [facility], [place]  [address], [place],  [address]      Discharge Instructions:   Discharge Instructions    Diet general   Complete by: As directed    Discharge instructions   Complete by: As directed    Lithium on hold until resume by psychiatry Monitor INR   Increase activity slowly   Complete by: As directed      Allergies as of 10/28/2018      Reactions   Codeine Anaphylaxis   Patient states she can take codeine but not percocet   Adhesive [tape]    blister   Celebrex [celecoxib] Hives   Sulfa Antibiotics Hives   Tricyclic Antidepressants    Doesn't help   Amoxicillin Rash   Ampicillin Rash   Cephalexin Rash   Penicillins Rash   Did it involve swelling of the face/tongue/throat, SOB, or low BP? Yes Did it involve sudden or severe rash/hives, skin peeling, or any reaction on the inside of your mouth or nose? NO Did you need to seek medical attention at a hospital or doctor's office? NO When did it last happen? childhood If all above answers are NO, may proceed with cephalosporin use.      Medication List    STOP taking these medications   baclofen 10 MG tablet Commonly known as: LIORESAL  estradiol 1 MG tablet Commonly known as: ESTRACE   HYDROcodone-acetaminophen 7.5-325 MG tablet Commonly known as: NORCO   lithium carbonate 300 MG capsule   LORazepam 1 MG tablet Commonly known as: Ativan   metoprolol succinate 50 MG 24 hr tablet Commonly known as: TOPROL-XL   mupirocin ointment 2 % Commonly known as: BACTROBAN   pregabalin 150 MG capsule Commonly known as: LYRICA     TAKE these medications   Aimovig 70 MG/ML Soaj Generic drug: Erenumab-aooe Inject 70 mg into the skin every 30 (thirty) days.   aspirin 325 MG tablet Take 325 mg by mouth daily as needed for headache.   Benadryl Allergy 25 mg capsule Generic drug: diphenhydrAMINE Take 50 mg by mouth 4 (four) times daily.   busPIRone 15 MG tablet Commonly known as: BUSPAR TAKE 1 TABLET (15 MG TOTAL)  BY MOUTH 2 (TWO) TIMES DAILY.   calcium carbonate 1250 (500 Ca) MG tablet Commonly known as: OS-CAL - dosed in mg of elemental calcium Take 1 tablet (500 mg of elemental calcium total) by mouth daily. For bone health   clotrimazole 1 % cream Commonly known as: LOTRIMIN Apply 1 application topically 2 (two) times daily as needed (skin care).   dextromethorphan-guaiFENesin 30-600 MG 12hr tablet Commonly known as: MUCINEX DM Take 1 tablet by mouth 2 (two) times daily.   ferrous sulfate 325 (65 FE) MG tablet Take 1 tablet (325 mg total) by mouth daily with breakfast. Ferrous sulfate   lamoTRIgine 100 MG tablet Commonly known as: LAMICTAL Take 1 tablet (100 mg total) by mouth 2 (two) times daily.   levothyroxine 100 MCG tablet Commonly known as: SYNTHROID Take 1 tablet (100 mcg total) by mouth daily before breakfast.   Melatonin 5 MG Tabs Take 15 mg by mouth at bedtime.   metFORMIN 500 MG tablet Commonly known as: Glucophage Take 2 tablets (1,000 mg total) by mouth 2 (two) times daily with a meal.   metoprolol tartrate 25 MG tablet Commonly known as: LOPRESSOR Take 1 tablet (25 mg total) by mouth 2 (two) times daily.   ondansetron 4 MG tablet Commonly known as: ZOFRAN Take 1 tablet (4 mg total) by mouth 3 (three) times daily as needed. As needed for nausea   pantoprazole 40 MG tablet Commonly known as: PROTONIX Take 1 tablet (40 mg total) by mouth daily.   pravastatin 20 MG tablet Commonly known as: PRAVACHOL Take 20 mg by mouth daily.   QUEtiapine Fumarate 150 MG 24 hr tablet Commonly known as: SEROQUEL XR Take 1 tablet (150 mg total) by mouth at bedtime.   senna-docusate 8.6-50 MG tablet Commonly known as: Peri-Colace Take 2 tablets by mouth 2 (two) times daily. For constipation   verapamil 120 MG 24 hr capsule Commonly known as: VERELAN PM Take 1 capsule (120 mg total) by mouth at bedtime. For high blood pressure   Vitamin D3 25 MCG (1000 UT) Caps Take 5,000  Units by mouth daily.   warfarin 2.5 MG tablet Commonly known as: COUMADIN Take as directed. If you are unsure how to take this medication, talk to your nurse or doctor. Original instructions: Take as directed by [facility] What changed:   how much to take  how to take this  when to take this  additional instructions  Another medication with the same name was removed. Continue taking this medication, and follow the directions you see here.         Time coordinating discharge: 35 min  Signed:  Geradine Girt DO  [facility] 10/28/2018, 11:07 AM

## 2018-10-29 ENCOUNTER — Ambulatory Visit: Payer: Medicare Other | Admitting: Psychology

## 2018-10-29 ENCOUNTER — Telehealth: Payer: Self-pay | Admitting: Psychiatry

## 2018-10-29 DIAGNOSIS — I480 Paroxysmal atrial fibrillation: Secondary | ICD-10-CM | POA: Diagnosis not present

## 2018-10-29 DIAGNOSIS — T56891A Toxic effect of other metals, accidental (unintentional), initial encounter: Secondary | ICD-10-CM | POA: Diagnosis not present

## 2018-10-29 DIAGNOSIS — R892 Abnormal level of other drugs, medicaments and biological substances in specimens from other organs, systems and tissues: Secondary | ICD-10-CM | POA: Diagnosis not present

## 2018-10-29 DIAGNOSIS — E119 Type 2 diabetes mellitus without complications: Secondary | ICD-10-CM | POA: Diagnosis not present

## 2018-10-29 DIAGNOSIS — F319 Bipolar disorder, unspecified: Secondary | ICD-10-CM | POA: Diagnosis not present

## 2018-10-29 DIAGNOSIS — G8929 Other chronic pain: Secondary | ICD-10-CM | POA: Diagnosis not present

## 2018-10-29 DIAGNOSIS — E039 Hypothyroidism, unspecified: Secondary | ICD-10-CM | POA: Diagnosis not present

## 2018-10-29 NOTE — Telephone Encounter (Signed)
Patient called and said that she was returning your call. P:lease call her back . She is in rehab at [phone]

## 2018-11-01 DIAGNOSIS — E119 Type 2 diabetes mellitus without complications: Secondary | ICD-10-CM | POA: Diagnosis not present

## 2018-11-01 DIAGNOSIS — M6281 Muscle weakness (generalized): Secondary | ICD-10-CM | POA: Diagnosis not present

## 2018-11-01 DIAGNOSIS — G8929 Other chronic pain: Secondary | ICD-10-CM | POA: Diagnosis not present

## 2018-11-01 DIAGNOSIS — E039 Hypothyroidism, unspecified: Secondary | ICD-10-CM | POA: Diagnosis not present

## 2018-11-01 DIAGNOSIS — I10 Essential (primary) hypertension: Secondary | ICD-10-CM | POA: Diagnosis not present

## 2018-11-01 DIAGNOSIS — I480 Paroxysmal atrial fibrillation: Secondary | ICD-10-CM | POA: Diagnosis not present

## 2018-11-01 DIAGNOSIS — R892 Abnormal level of other drugs, medicaments and biological substances in specimens from other organs, systems and tissues: Secondary | ICD-10-CM | POA: Diagnosis not present

## 2018-11-01 DIAGNOSIS — G9349 Other encephalopathy: Secondary | ICD-10-CM | POA: Diagnosis not present

## 2018-11-01 DIAGNOSIS — F319 Bipolar disorder, unspecified: Secondary | ICD-10-CM | POA: Diagnosis not present

## 2018-11-01 DIAGNOSIS — D508 Other iron deficiency anemias: Secondary | ICD-10-CM | POA: Diagnosis not present

## 2018-11-01 DIAGNOSIS — G43809 Other migraine, not intractable, without status migrainosus: Secondary | ICD-10-CM | POA: Diagnosis not present

## 2018-11-01 DIAGNOSIS — F419 Anxiety disorder, unspecified: Secondary | ICD-10-CM | POA: Diagnosis not present

## 2018-11-04 ENCOUNTER — Telehealth: Payer: Self-pay | Admitting: Psychiatry

## 2018-11-04 NOTE — Telephone Encounter (Signed)
error 

## 2018-11-06 DIAGNOSIS — F319 Bipolar disorder, unspecified: Secondary | ICD-10-CM | POA: Diagnosis not present

## 2018-11-06 DIAGNOSIS — G8929 Other chronic pain: Secondary | ICD-10-CM | POA: Diagnosis not present

## 2018-11-06 DIAGNOSIS — R892 Abnormal level of other drugs, medicaments and biological substances in specimens from other organs, systems and tissues: Secondary | ICD-10-CM | POA: Diagnosis not present

## 2018-11-06 DIAGNOSIS — I480 Paroxysmal atrial fibrillation: Secondary | ICD-10-CM | POA: Diagnosis not present

## 2018-11-11 DIAGNOSIS — R892 Abnormal level of other drugs, medicaments and biological substances in specimens from other organs, systems and tissues: Secondary | ICD-10-CM | POA: Diagnosis not present

## 2018-11-11 DIAGNOSIS — I10 Essential (primary) hypertension: Secondary | ICD-10-CM | POA: Diagnosis not present

## 2018-11-11 DIAGNOSIS — F319 Bipolar disorder, unspecified: Secondary | ICD-10-CM | POA: Diagnosis not present

## 2018-11-11 DIAGNOSIS — I480 Paroxysmal atrial fibrillation: Secondary | ICD-10-CM | POA: Diagnosis not present

## 2018-11-12 ENCOUNTER — Ambulatory Visit (INDEPENDENT_AMBULATORY_CARE_PROVIDER_SITE_OTHER): Payer: Medicare Other | Admitting: Psychology

## 2018-11-12 DIAGNOSIS — F3181 Bipolar II disorder: Secondary | ICD-10-CM | POA: Diagnosis not present

## 2018-11-15 DIAGNOSIS — E039 Hypothyroidism, unspecified: Secondary | ICD-10-CM | POA: Diagnosis not present

## 2018-11-15 DIAGNOSIS — R296 Repeated falls: Secondary | ICD-10-CM | POA: Diagnosis not present

## 2018-11-15 DIAGNOSIS — F3181 Bipolar II disorder: Secondary | ICD-10-CM | POA: Diagnosis not present

## 2018-11-15 DIAGNOSIS — T56891D Toxic effect of other metals, accidental (unintentional), subsequent encounter: Secondary | ICD-10-CM | POA: Diagnosis not present

## 2018-11-15 DIAGNOSIS — G43709 Chronic migraine without aura, not intractable, without status migrainosus: Secondary | ICD-10-CM | POA: Diagnosis not present

## 2018-11-15 DIAGNOSIS — F419 Anxiety disorder, unspecified: Secondary | ICD-10-CM | POA: Diagnosis not present

## 2018-11-15 DIAGNOSIS — Z9181 History of falling: Secondary | ICD-10-CM | POA: Diagnosis not present

## 2018-11-15 DIAGNOSIS — M8580 Other specified disorders of bone density and structure, unspecified site: Secondary | ICD-10-CM | POA: Diagnosis not present

## 2018-11-15 DIAGNOSIS — N183 Chronic kidney disease, stage 3 (moderate): Secondary | ICD-10-CM | POA: Diagnosis not present

## 2018-11-15 DIAGNOSIS — Z7901 Long term (current) use of anticoagulants: Secondary | ICD-10-CM | POA: Diagnosis not present

## 2018-11-15 DIAGNOSIS — M797 Fibromyalgia: Secondary | ICD-10-CM | POA: Diagnosis not present

## 2018-11-15 DIAGNOSIS — E785 Hyperlipidemia, unspecified: Secondary | ICD-10-CM | POA: Diagnosis not present

## 2018-11-15 DIAGNOSIS — I480 Paroxysmal atrial fibrillation: Secondary | ICD-10-CM | POA: Diagnosis not present

## 2018-11-17 ENCOUNTER — Encounter: Payer: Self-pay | Admitting: Psychiatry

## 2018-11-17 ENCOUNTER — Other Ambulatory Visit: Payer: Self-pay

## 2018-11-17 ENCOUNTER — Ambulatory Visit (INDEPENDENT_AMBULATORY_CARE_PROVIDER_SITE_OTHER): Payer: Medicare Other | Admitting: Psychiatry

## 2018-11-17 DIAGNOSIS — F3131 Bipolar disorder, current episode depressed, mild: Secondary | ICD-10-CM

## 2018-11-17 DIAGNOSIS — F5105 Insomnia due to other mental disorder: Secondary | ICD-10-CM

## 2018-11-17 DIAGNOSIS — S060X0D Concussion without loss of consciousness, subsequent encounter: Secondary | ICD-10-CM

## 2018-11-17 DIAGNOSIS — F411 Generalized anxiety disorder: Secondary | ICD-10-CM

## 2018-11-17 DIAGNOSIS — F4001 Agoraphobia with panic disorder: Secondary | ICD-10-CM

## 2018-11-17 DIAGNOSIS — G3184 Mild cognitive impairment, so stated: Secondary | ICD-10-CM | POA: Diagnosis not present

## 2018-11-17 DIAGNOSIS — I639 Cerebral infarction, unspecified: Secondary | ICD-10-CM | POA: Diagnosis not present

## 2018-11-17 DIAGNOSIS — F4310 Post-traumatic stress disorder, unspecified: Secondary | ICD-10-CM | POA: Diagnosis not present

## 2018-11-17 DIAGNOSIS — T56891A Toxic effect of other metals, accidental (unintentional), initial encounter: Secondary | ICD-10-CM
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
## 2018-11-19 DIAGNOSIS — F4323 Adjustment disorder with mixed anxiety and depressed mood: Secondary | ICD-10-CM | POA: Diagnosis not present

## 2018-11-20 DIAGNOSIS — I480 Paroxysmal atrial fibrillation: Secondary | ICD-10-CM | POA: Diagnosis not present

## 2018-11-20 DIAGNOSIS — E039 Hypothyroidism, unspecified: Secondary | ICD-10-CM | POA: Diagnosis not present

## 2018-11-20 DIAGNOSIS — E785 Hyperlipidemia, unspecified: Secondary | ICD-10-CM | POA: Diagnosis not present

## 2018-11-20 DIAGNOSIS — N183 Chronic kidney disease, stage 3 (moderate): Secondary | ICD-10-CM | POA: Diagnosis not present

## 2018-11-20 DIAGNOSIS — G43709 Chronic migraine without aura, not intractable, without status migrainosus: Secondary | ICD-10-CM | POA: Diagnosis not present

## 2018-11-20 DIAGNOSIS — T56891D Toxic effect of other metals, accidental (unintentional), subsequent encounter: Secondary | ICD-10-CM | POA: Diagnosis not present

## 2018-11-24 DIAGNOSIS — G518 Other disorders of facial nerve: Secondary | ICD-10-CM | POA: Diagnosis not present

## 2018-11-24 DIAGNOSIS — M791 Myalgia, unspecified site: Secondary | ICD-10-CM | POA: Diagnosis not present

## 2018-11-24 DIAGNOSIS — G43719 Chronic migraine without aura, intractable, without status migrainosus: Secondary | ICD-10-CM | POA: Diagnosis not present

## 2018-11-24 DIAGNOSIS — M542 Cervicalgia: Secondary | ICD-10-CM | POA: Diagnosis not present

## 2018-11-25 DIAGNOSIS — M797 Fibromyalgia: Secondary | ICD-10-CM | POA: Diagnosis not present

## 2018-11-25 DIAGNOSIS — M47816 Spondylosis without myelopathy or radiculopathy, lumbar region: Secondary | ICD-10-CM | POA: Diagnosis not present

## 2018-11-25 DIAGNOSIS — Z79899 Other long term (current) drug therapy: Secondary | ICD-10-CM | POA: Diagnosis not present

## 2018-11-25 DIAGNOSIS — Z79891 Long term (current) use of opiate analgesic: Secondary | ICD-10-CM | POA: Diagnosis not present

## 2018-11-25 DIAGNOSIS — G894 Chronic pain syndrome: Secondary | ICD-10-CM | POA: Diagnosis not present

## 2018-11-25 DIAGNOSIS — M47814 Spondylosis without myelopathy or radiculopathy, thoracic region: Secondary | ICD-10-CM | POA: Diagnosis not present

## 2018-11-26 ENCOUNTER — Ambulatory Visit: Payer: Medicare Other | Admitting: Psychology

## 2018-11-26 DIAGNOSIS — I480 Paroxysmal atrial fibrillation: Secondary | ICD-10-CM | POA: Diagnosis not present

## 2018-11-26 DIAGNOSIS — T56891D Toxic effect of other metals, accidental (unintentional), subsequent encounter: Secondary | ICD-10-CM | POA: Diagnosis not present

## 2018-11-26 DIAGNOSIS — N183 Chronic kidney disease, stage 3 (moderate): Secondary | ICD-10-CM | POA: Diagnosis not present

## 2018-11-26 DIAGNOSIS — E785 Hyperlipidemia, unspecified: Secondary | ICD-10-CM | POA: Diagnosis not present

## 2018-11-26 DIAGNOSIS — E039 Hypothyroidism, unspecified: Secondary | ICD-10-CM | POA: Diagnosis not present

## 2018-11-26 DIAGNOSIS — G43709 Chronic migraine without aura, not intractable, without status migrainosus: Secondary | ICD-10-CM | POA: Diagnosis not present

## 2018-11-27 DIAGNOSIS — Z09 Encounter for follow-up examination after completed treatment for conditions other than malignant neoplasm: Secondary | ICD-10-CM | POA: Diagnosis not present

## 2018-11-27 DIAGNOSIS — E876 Hypokalemia: Secondary | ICD-10-CM | POA: Diagnosis not present

## 2018-11-27 DIAGNOSIS — Z7901 Long term (current) use of anticoagulants: Secondary | ICD-10-CM | POA: Diagnosis not present

## 2018-11-27 DIAGNOSIS — N183 Chronic kidney disease, stage 3 unspecified: Secondary | ICD-10-CM | POA: Diagnosis not present

## 2018-11-27 DIAGNOSIS — D649 Anemia, unspecified: Secondary | ICD-10-CM | POA: Diagnosis not present

## 2018-11-27 DIAGNOSIS — D6869 Other thrombophilia: Secondary | ICD-10-CM | POA: Diagnosis not present

## 2018-11-27 DIAGNOSIS — E782 Mixed hyperlipidemia: Secondary | ICD-10-CM | POA: Diagnosis not present

## 2018-11-27 DIAGNOSIS — Z23 Encounter for immunization: Secondary | ICD-10-CM | POA: Diagnosis not present

## 2018-11-29 ENCOUNTER — Other Ambulatory Visit: Payer: Self-pay | Admitting: Psychiatry

## 2018-11-29 DIAGNOSIS — F4310 Post-traumatic stress disorder, unspecified: Secondary | ICD-10-CM

## 2018-11-29 DIAGNOSIS — F411 Generalized anxiety disorder: Secondary | ICD-10-CM

## 2018-12-02 ENCOUNTER — Ambulatory Visit (INDEPENDENT_AMBULATORY_CARE_PROVIDER_SITE_OTHER): Payer: Medicare Other | Admitting: *Deleted

## 2018-12-02 ENCOUNTER — Other Ambulatory Visit: Payer: Self-pay

## 2018-12-02 DIAGNOSIS — I480 Paroxysmal atrial fibrillation: Secondary | ICD-10-CM

## 2018-12-02 DIAGNOSIS — E785 Hyperlipidemia, unspecified: Secondary | ICD-10-CM | POA: Diagnosis not present

## 2018-12-02 DIAGNOSIS — G43709 Chronic migraine without aura, not intractable, without status migrainosus: Secondary | ICD-10-CM | POA: Diagnosis not present

## 2018-12-02 DIAGNOSIS — E039 Hypothyroidism, unspecified: Secondary | ICD-10-CM | POA: Diagnosis not present

## 2018-12-02 DIAGNOSIS — Z5181 Encounter for therapeutic drug level monitoring: Secondary | ICD-10-CM | POA: Diagnosis not present

## 2018-12-02 DIAGNOSIS — N183 Chronic kidney disease, stage 3 (moderate): Secondary | ICD-10-CM | POA: Diagnosis not present

## 2018-12-02 DIAGNOSIS — T56891D Toxic effect of other metals, accidental (unintentional), subsequent encounter: Secondary | ICD-10-CM | POA: Diagnosis not present

## 2018-12-02 LAB — PROTIME-INR
INR: 7.4 (ref 0.9–1.2)
Prothrombin Time: 78.3 s — ABNORMAL HIGH (ref 9.1–12.0)

## 2018-12-02 LAB — POCT INR: INR: 7.2 — AB (ref 2.0–3.0)

## 2018-12-02 NOTE — Patient Instructions (Addendum)
  Description   Hold Coumadin 10/7, 10/8, 10/9, Come back to get INR checked on 10/9.   Normal dose: 2.5 mg daily except for 5mg  on Friday. Call with any medication changes or procedures: [facility] [phone] Main [phone]

## 2018-12-02 NOTE — Progress Notes (Signed)
Pt's POC INR 7.2, Pt sent to lab. Told pt to not take any coumadin until we call and give her updated lab results. Instructed pt to have her phone near her.

## 2018-12-03 DIAGNOSIS — E785 Hyperlipidemia, unspecified: Secondary | ICD-10-CM | POA: Diagnosis not present

## 2018-12-03 DIAGNOSIS — I480 Paroxysmal atrial fibrillation: Secondary | ICD-10-CM | POA: Diagnosis not present

## 2018-12-03 DIAGNOSIS — E039 Hypothyroidism, unspecified: Secondary | ICD-10-CM | POA: Diagnosis not present

## 2018-12-03 DIAGNOSIS — N183 Chronic kidney disease, stage 3 (moderate): Secondary | ICD-10-CM | POA: Diagnosis not present

## 2018-12-03 DIAGNOSIS — G43709 Chronic migraine without aura, not intractable, without status migrainosus: Secondary | ICD-10-CM | POA: Diagnosis not present

## 2018-12-03 DIAGNOSIS — T56891D Toxic effect of other metals, accidental (unintentional), subsequent encounter: Secondary | ICD-10-CM | POA: Diagnosis not present

## 2018-12-04 DIAGNOSIS — F209 Schizophrenia, unspecified: Secondary | ICD-10-CM | POA: Diagnosis not present

## 2018-12-05 ENCOUNTER — Ambulatory Visit (INDEPENDENT_AMBULATORY_CARE_PROVIDER_SITE_OTHER): Payer: Medicare Other

## 2018-12-05 ENCOUNTER — Other Ambulatory Visit: Payer: Self-pay

## 2018-12-05 DIAGNOSIS — Z5181 Encounter for therapeutic drug level monitoring: Secondary | ICD-10-CM | POA: Diagnosis not present

## 2018-12-05 DIAGNOSIS — I480 Paroxysmal atrial fibrillation: Secondary | ICD-10-CM

## 2018-12-05 LAB — POCT INR: INR: 2.7 (ref 2.0–3.0)

## 2018-12-05 NOTE — Patient Instructions (Signed)
Description   Start taking 2.5mg  daily. Recheck in 1 week.  Call with any medication changes or procedures: [facility] [phone] Main [phone]

## 2018-12-08 ENCOUNTER — Other Ambulatory Visit: Payer: Self-pay

## 2018-12-08 ENCOUNTER — Telehealth: Payer: Self-pay | Admitting: Psychiatry

## 2018-12-08 DIAGNOSIS — F319 Bipolar disorder, unspecified: Secondary | ICD-10-CM

## 2018-12-08 MED ORDER — QUETIAPINE FUMARATE ER 150 MG PO TB24: 150.0000 mg | ORAL_TABLET | Freq: Every day | ORAL | 2 refills | Status: DC

## 2018-12-08 NOTE — Telephone Encounter (Signed)
Need to check with patient where she wants refill submitted

## 2018-12-08 NOTE — Telephone Encounter (Signed)
Pt left a voicemail requesting a refill on her Seroquel.

## 2018-12-09 ENCOUNTER — Other Ambulatory Visit: Payer: Self-pay

## 2018-12-09 DIAGNOSIS — F319 Bipolar disorder, unspecified: Secondary | ICD-10-CM

## 2018-12-09 MED ORDER — QUETIAPINE FUMARATE ER 150 MG PO TB24: 150.0000 mg | ORAL_TABLET | Freq: Every day | ORAL | 2 refills | Status: DC

## 2018-12-09 NOTE — Telephone Encounter (Signed)
Confirmed with pt to send to [facility] as a 90 day supply

## 2018-12-10 ENCOUNTER — Ambulatory Visit (INDEPENDENT_AMBULATORY_CARE_PROVIDER_SITE_OTHER): Payer: Medicare Other | Admitting: Psychology

## 2018-12-10 DIAGNOSIS — T56891D Toxic effect of other metals, accidental (unintentional), subsequent encounter: Secondary | ICD-10-CM | POA: Diagnosis not present

## 2018-12-10 DIAGNOSIS — G43709 Chronic migraine without aura, not intractable, without status migrainosus: Secondary | ICD-10-CM | POA: Diagnosis not present

## 2018-12-10 DIAGNOSIS — I480 Paroxysmal atrial fibrillation: Secondary | ICD-10-CM | POA: Diagnosis not present

## 2018-12-10 DIAGNOSIS — E039 Hypothyroidism, unspecified: Secondary | ICD-10-CM | POA: Diagnosis not present

## 2018-12-10 DIAGNOSIS — F3181 Bipolar II disorder: Secondary | ICD-10-CM

## 2018-12-10 DIAGNOSIS — E785 Hyperlipidemia, unspecified: Secondary | ICD-10-CM | POA: Diagnosis not present

## 2018-12-10 DIAGNOSIS — N183 Chronic kidney disease, stage 3 (moderate): Secondary | ICD-10-CM | POA: Diagnosis not present

## 2018-12-12 ENCOUNTER — Ambulatory Visit (INDEPENDENT_AMBULATORY_CARE_PROVIDER_SITE_OTHER): Payer: Medicare Other | Admitting: *Deleted

## 2018-12-12 ENCOUNTER — Other Ambulatory Visit: Payer: Self-pay

## 2018-12-12 DIAGNOSIS — E876 Hypokalemia: Secondary | ICD-10-CM | POA: Diagnosis not present

## 2018-12-12 DIAGNOSIS — Z09 Encounter for follow-up examination after completed treatment for conditions other than malignant neoplasm: Secondary | ICD-10-CM | POA: Diagnosis not present

## 2018-12-12 DIAGNOSIS — N183 Chronic kidney disease, stage 3 unspecified: Secondary | ICD-10-CM | POA: Diagnosis not present

## 2018-12-12 DIAGNOSIS — Z5181 Encounter for therapeutic drug level monitoring: Secondary | ICD-10-CM | POA: Diagnosis not present

## 2018-12-12 DIAGNOSIS — I480 Paroxysmal atrial fibrillation: Secondary | ICD-10-CM

## 2018-12-12 DIAGNOSIS — Z23 Encounter for immunization: Secondary | ICD-10-CM | POA: Diagnosis not present

## 2018-12-12 DIAGNOSIS — Z7901 Long term (current) use of anticoagulants: Secondary | ICD-10-CM | POA: Diagnosis not present

## 2018-12-12 DIAGNOSIS — E782 Mixed hyperlipidemia: Secondary | ICD-10-CM | POA: Diagnosis not present

## 2018-12-12 LAB — POCT INR: INR: 2.1 (ref 2.0–3.0)

## 2018-12-12 NOTE — Patient Instructions (Signed)
Description   Continue taking 2.5mg  daily. Recheck in 12 days.  Call with any medication changes or procedures: [facility] [phone] Main [phone]

## 2018-12-15 DIAGNOSIS — M797 Fibromyalgia: Secondary | ICD-10-CM | POA: Diagnosis not present

## 2018-12-15 DIAGNOSIS — I480 Paroxysmal atrial fibrillation: Secondary | ICD-10-CM | POA: Diagnosis not present

## 2018-12-15 DIAGNOSIS — F3181 Bipolar II disorder: Secondary | ICD-10-CM | POA: Diagnosis not present

## 2018-12-15 DIAGNOSIS — F419 Anxiety disorder, unspecified: Secondary | ICD-10-CM | POA: Diagnosis not present

## 2018-12-15 DIAGNOSIS — G43709 Chronic migraine without aura, not intractable, without status migrainosus: Secondary | ICD-10-CM | POA: Diagnosis not present

## 2018-12-15 DIAGNOSIS — E785 Hyperlipidemia, unspecified: Secondary | ICD-10-CM | POA: Diagnosis not present

## 2018-12-15 DIAGNOSIS — R296 Repeated falls: Secondary | ICD-10-CM | POA: Diagnosis not present

## 2018-12-15 DIAGNOSIS — M8580 Other specified disorders of bone density and structure, unspecified site: Secondary | ICD-10-CM | POA: Diagnosis not present

## 2018-12-15 DIAGNOSIS — N183 Chronic kidney disease, stage 3 unspecified: Secondary | ICD-10-CM | POA: Diagnosis not present

## 2018-12-15 DIAGNOSIS — T56891D Toxic effect of other metals, accidental (unintentional), subsequent encounter: Secondary | ICD-10-CM | POA: Diagnosis not present

## 2018-12-15 DIAGNOSIS — Z9181 History of falling: Secondary | ICD-10-CM | POA: Diagnosis not present

## 2018-12-15 DIAGNOSIS — Z7901 Long term (current) use of anticoagulants: Secondary | ICD-10-CM | POA: Diagnosis not present

## 2018-12-15 DIAGNOSIS — E039 Hypothyroidism, unspecified: Secondary | ICD-10-CM | POA: Diagnosis not present

## 2018-12-19 DIAGNOSIS — G894 Chronic pain syndrome: Secondary | ICD-10-CM | POA: Diagnosis not present

## 2018-12-19 DIAGNOSIS — N183 Chronic kidney disease, stage 3 unspecified: Secondary | ICD-10-CM | POA: Diagnosis not present

## 2018-12-19 DIAGNOSIS — D6869 Other thrombophilia: Secondary | ICD-10-CM | POA: Diagnosis not present

## 2018-12-19 DIAGNOSIS — Z0000 Encounter for general adult medical examination without abnormal findings: Secondary | ICD-10-CM | POA: Diagnosis not present

## 2018-12-19 DIAGNOSIS — D649 Anemia, unspecified: Secondary | ICD-10-CM | POA: Diagnosis not present

## 2018-12-20 ENCOUNTER — Other Ambulatory Visit: Payer: Self-pay | Admitting: Cardiovascular Disease

## 2018-12-22 DIAGNOSIS — G43719 Chronic migraine without aura, intractable, without status migrainosus: Secondary | ICD-10-CM | POA: Diagnosis not present

## 2018-12-22 DIAGNOSIS — G518 Other disorders of facial nerve: Secondary | ICD-10-CM | POA: Diagnosis not present

## 2018-12-22 DIAGNOSIS — M542 Cervicalgia: Secondary | ICD-10-CM | POA: Diagnosis not present

## 2018-12-22 DIAGNOSIS — M791 Myalgia, unspecified site: Secondary | ICD-10-CM | POA: Diagnosis not present

## 2018-12-24 ENCOUNTER — Other Ambulatory Visit: Payer: Self-pay

## 2018-12-24 ENCOUNTER — Ambulatory Visit (INDEPENDENT_AMBULATORY_CARE_PROVIDER_SITE_OTHER): Payer: Medicare Other | Admitting: Psychology

## 2018-12-24 ENCOUNTER — Ambulatory Visit (INDEPENDENT_AMBULATORY_CARE_PROVIDER_SITE_OTHER): Payer: Medicare Other | Admitting: *Deleted

## 2018-12-24 DIAGNOSIS — Z5181 Encounter for therapeutic drug level monitoring: Secondary | ICD-10-CM

## 2018-12-24 DIAGNOSIS — F3181 Bipolar II disorder: Secondary | ICD-10-CM | POA: Diagnosis not present

## 2018-12-24 DIAGNOSIS — I480 Paroxysmal atrial fibrillation: Secondary | ICD-10-CM | POA: Diagnosis not present

## 2018-12-24 LAB — POCT INR: INR: 1.4 — AB (ref 2.0–3.0)

## 2018-12-24 NOTE — Patient Instructions (Addendum)
Description   Take 1.5 tablets today and tomorrow, then continue taking 2.5mg  daily. Recheck in 1 week.  Call with any medication changes or procedures: [facility] [phone] Main [phone]

## 2018-12-26 DIAGNOSIS — G894 Chronic pain syndrome: Secondary | ICD-10-CM | POA: Diagnosis not present

## 2018-12-26 DIAGNOSIS — M169 Osteoarthritis of hip, unspecified: Secondary | ICD-10-CM | POA: Diagnosis not present

## 2018-12-26 DIAGNOSIS — M797 Fibromyalgia: Secondary | ICD-10-CM | POA: Diagnosis not present

## 2018-12-26 DIAGNOSIS — M47816 Spondylosis without myelopathy or radiculopathy, lumbar region: Secondary | ICD-10-CM | POA: Diagnosis not present

## 2018-12-30 DIAGNOSIS — T56891D Toxic effect of other metals, accidental (unintentional), subsequent encounter: Secondary | ICD-10-CM | POA: Diagnosis not present

## 2018-12-30 DIAGNOSIS — E785 Hyperlipidemia, unspecified: Secondary | ICD-10-CM | POA: Diagnosis not present

## 2018-12-30 DIAGNOSIS — I480 Paroxysmal atrial fibrillation: Secondary | ICD-10-CM | POA: Diagnosis not present

## 2018-12-30 DIAGNOSIS — G43709 Chronic migraine without aura, not intractable, without status migrainosus: Secondary | ICD-10-CM | POA: Diagnosis not present

## 2018-12-30 DIAGNOSIS — N183 Chronic kidney disease, stage 3 unspecified: Secondary | ICD-10-CM | POA: Diagnosis not present

## 2018-12-30 DIAGNOSIS — E039 Hypothyroidism, unspecified: Secondary | ICD-10-CM | POA: Diagnosis not present

## 2018-12-31 ENCOUNTER — Ambulatory Visit (INDEPENDENT_AMBULATORY_CARE_PROVIDER_SITE_OTHER): Payer: Medicare Other | Admitting: *Deleted

## 2018-12-31 ENCOUNTER — Other Ambulatory Visit: Payer: Self-pay

## 2018-12-31 DIAGNOSIS — Z5181 Encounter for therapeutic drug level monitoring: Secondary | ICD-10-CM

## 2018-12-31 DIAGNOSIS — I480 Paroxysmal atrial fibrillation: Secondary | ICD-10-CM | POA: Diagnosis not present

## 2018-12-31 LAB — POCT INR: INR: 1.8 — AB (ref 2.0–3.0)

## 2018-12-31 NOTE — Patient Instructions (Signed)
Description   Take 1.5 tablets today then start taking 2.5mg  (1 tablet) daily except 3.75mg  (1.5 tablets) on Sundays. Recheck in 2 weeks.  Call with any medication changes or procedures: [facility] [phone] Main [phone]

## 2019-01-01 DIAGNOSIS — E785 Hyperlipidemia, unspecified: Secondary | ICD-10-CM | POA: Diagnosis not present

## 2019-01-01 DIAGNOSIS — N183 Chronic kidney disease, stage 3 unspecified: Secondary | ICD-10-CM | POA: Diagnosis not present

## 2019-01-01 DIAGNOSIS — G43709 Chronic migraine without aura, not intractable, without status migrainosus: Secondary | ICD-10-CM | POA: Diagnosis not present

## 2019-01-01 DIAGNOSIS — E039 Hypothyroidism, unspecified: Secondary | ICD-10-CM | POA: Diagnosis not present

## 2019-01-01 DIAGNOSIS — T56891D Toxic effect of other metals, accidental (unintentional), subsequent encounter: Secondary | ICD-10-CM | POA: Diagnosis not present

## 2019-01-01 DIAGNOSIS — I480 Paroxysmal atrial fibrillation: Secondary | ICD-10-CM | POA: Diagnosis not present

## 2019-01-02 DIAGNOSIS — T56891D Toxic effect of other metals, accidental (unintentional), subsequent encounter: Secondary | ICD-10-CM | POA: Diagnosis not present

## 2019-01-02 DIAGNOSIS — G43709 Chronic migraine without aura, not intractable, without status migrainosus: Secondary | ICD-10-CM | POA: Diagnosis not present

## 2019-01-02 DIAGNOSIS — I480 Paroxysmal atrial fibrillation: Secondary | ICD-10-CM | POA: Diagnosis not present

## 2019-01-02 DIAGNOSIS — N183 Chronic kidney disease, stage 3 unspecified: Secondary | ICD-10-CM | POA: Diagnosis not present

## 2019-01-02 DIAGNOSIS — E785 Hyperlipidemia, unspecified: Secondary | ICD-10-CM | POA: Diagnosis not present

## 2019-01-02 DIAGNOSIS — E039 Hypothyroidism, unspecified: Secondary | ICD-10-CM | POA: Diagnosis not present

## 2019-01-03 ENCOUNTER — Other Ambulatory Visit: Payer: Self-pay | Admitting: Psychiatry

## 2019-01-03 DIAGNOSIS — F3131 Bipolar disorder, current episode depressed, mild: Secondary | ICD-10-CM

## 2019-01-05 DIAGNOSIS — I480 Paroxysmal atrial fibrillation: Secondary | ICD-10-CM | POA: Diagnosis not present

## 2019-01-05 DIAGNOSIS — E039 Hypothyroidism, unspecified: Secondary | ICD-10-CM | POA: Diagnosis not present

## 2019-01-05 DIAGNOSIS — G43709 Chronic migraine without aura, not intractable, without status migrainosus: Secondary | ICD-10-CM | POA: Diagnosis not present

## 2019-01-05 DIAGNOSIS — N183 Chronic kidney disease, stage 3 unspecified: Secondary | ICD-10-CM | POA: Diagnosis not present

## 2019-01-05 DIAGNOSIS — T56891D Toxic effect of other metals, accidental (unintentional), subsequent encounter: Secondary | ICD-10-CM | POA: Diagnosis not present

## 2019-01-05 DIAGNOSIS — E785 Hyperlipidemia, unspecified: Secondary | ICD-10-CM | POA: Diagnosis not present

## 2019-01-06 DIAGNOSIS — T56891D Toxic effect of other metals, accidental (unintentional), subsequent encounter: Secondary | ICD-10-CM | POA: Diagnosis not present

## 2019-01-06 DIAGNOSIS — N183 Chronic kidney disease, stage 3 unspecified: Secondary | ICD-10-CM | POA: Diagnosis not present

## 2019-01-06 DIAGNOSIS — E785 Hyperlipidemia, unspecified: Secondary | ICD-10-CM | POA: Diagnosis not present

## 2019-01-06 DIAGNOSIS — G43709 Chronic migraine without aura, not intractable, without status migrainosus: Secondary | ICD-10-CM | POA: Diagnosis not present

## 2019-01-06 DIAGNOSIS — I480 Paroxysmal atrial fibrillation: Secondary | ICD-10-CM | POA: Diagnosis not present

## 2019-01-06 DIAGNOSIS — E039 Hypothyroidism, unspecified: Secondary | ICD-10-CM | POA: Diagnosis not present

## 2019-01-07 ENCOUNTER — Ambulatory Visit (INDEPENDENT_AMBULATORY_CARE_PROVIDER_SITE_OTHER): Payer: Medicare Other | Admitting: Psychology

## 2019-01-07 DIAGNOSIS — N183 Chronic kidney disease, stage 3 unspecified: Secondary | ICD-10-CM | POA: Diagnosis not present

## 2019-01-07 DIAGNOSIS — E039 Hypothyroidism, unspecified: Secondary | ICD-10-CM | POA: Diagnosis not present

## 2019-01-07 DIAGNOSIS — T56891D Toxic effect of other metals, accidental (unintentional), subsequent encounter: Secondary | ICD-10-CM | POA: Diagnosis not present

## 2019-01-07 DIAGNOSIS — F3181 Bipolar II disorder: Secondary | ICD-10-CM

## 2019-01-07 DIAGNOSIS — I480 Paroxysmal atrial fibrillation: Secondary | ICD-10-CM | POA: Diagnosis not present

## 2019-01-07 DIAGNOSIS — E785 Hyperlipidemia, unspecified: Secondary | ICD-10-CM | POA: Diagnosis not present

## 2019-01-07 DIAGNOSIS — G43709 Chronic migraine without aura, not intractable, without status migrainosus: Secondary | ICD-10-CM | POA: Diagnosis not present

## 2019-01-08 DIAGNOSIS — E785 Hyperlipidemia, unspecified: Secondary | ICD-10-CM | POA: Diagnosis not present

## 2019-01-08 DIAGNOSIS — G43709 Chronic migraine without aura, not intractable, without status migrainosus: Secondary | ICD-10-CM | POA: Diagnosis not present

## 2019-01-08 DIAGNOSIS — N183 Chronic kidney disease, stage 3 unspecified: Secondary | ICD-10-CM | POA: Diagnosis not present

## 2019-01-08 DIAGNOSIS — F209 Schizophrenia, unspecified: Secondary | ICD-10-CM | POA: Diagnosis not present

## 2019-01-08 DIAGNOSIS — I480 Paroxysmal atrial fibrillation: Secondary | ICD-10-CM | POA: Diagnosis not present

## 2019-01-08 DIAGNOSIS — T56891D Toxic effect of other metals, accidental (unintentional), subsequent encounter: Secondary | ICD-10-CM | POA: Diagnosis not present

## 2019-01-08 DIAGNOSIS — E039 Hypothyroidism, unspecified: Secondary | ICD-10-CM | POA: Diagnosis not present

## 2019-01-12 DIAGNOSIS — G43709 Chronic migraine without aura, not intractable, without status migrainosus: Secondary | ICD-10-CM | POA: Diagnosis not present

## 2019-01-12 DIAGNOSIS — E039 Hypothyroidism, unspecified: Secondary | ICD-10-CM | POA: Diagnosis not present

## 2019-01-12 DIAGNOSIS — E785 Hyperlipidemia, unspecified: Secondary | ICD-10-CM | POA: Diagnosis not present

## 2019-01-12 DIAGNOSIS — N183 Chronic kidney disease, stage 3 unspecified: Secondary | ICD-10-CM | POA: Diagnosis not present

## 2019-01-12 DIAGNOSIS — I480 Paroxysmal atrial fibrillation: Secondary | ICD-10-CM | POA: Diagnosis not present

## 2019-01-12 DIAGNOSIS — T56891D Toxic effect of other metals, accidental (unintentional), subsequent encounter: Secondary | ICD-10-CM | POA: Diagnosis not present

## 2019-01-13 DIAGNOSIS — N183 Chronic kidney disease, stage 3 unspecified: Secondary | ICD-10-CM | POA: Diagnosis not present

## 2019-01-13 DIAGNOSIS — G43709 Chronic migraine without aura, not intractable, without status migrainosus: Secondary | ICD-10-CM | POA: Diagnosis not present

## 2019-01-13 DIAGNOSIS — T56891D Toxic effect of other metals, accidental (unintentional), subsequent encounter: Secondary | ICD-10-CM | POA: Diagnosis not present

## 2019-01-13 DIAGNOSIS — E039 Hypothyroidism, unspecified: Secondary | ICD-10-CM | POA: Diagnosis not present

## 2019-01-13 DIAGNOSIS — I480 Paroxysmal atrial fibrillation: Secondary | ICD-10-CM | POA: Diagnosis not present

## 2019-01-13 DIAGNOSIS — E785 Hyperlipidemia, unspecified: Secondary | ICD-10-CM | POA: Diagnosis not present

## 2019-01-14 ENCOUNTER — Ambulatory Visit (INDEPENDENT_AMBULATORY_CARE_PROVIDER_SITE_OTHER): Payer: Medicare Other

## 2019-01-14 ENCOUNTER — Other Ambulatory Visit: Payer: Self-pay

## 2019-01-14 ENCOUNTER — Other Ambulatory Visit: Payer: Self-pay | Admitting: Cardiovascular Disease

## 2019-01-14 DIAGNOSIS — I480 Paroxysmal atrial fibrillation: Secondary | ICD-10-CM

## 2019-01-14 DIAGNOSIS — Z5181 Encounter for therapeutic drug level monitoring: Secondary | ICD-10-CM

## 2019-01-14 LAB — POCT INR: INR: 1.4 — AB (ref 2.0–3.0)

## 2019-01-14 NOTE — Patient Instructions (Signed)
Description   Take an extra half tablet today (already took 1 tablet this morning), then start taking 2.5mg  (1 tablet) daily except 3.75mg  (1.5 tablets) on Thursdays and Sundays. Recheck in 2 weeks.  Call with any medication changes or procedures: [facility] [phone] Main [phone]

## 2019-01-15 ENCOUNTER — Ambulatory Visit (INDEPENDENT_AMBULATORY_CARE_PROVIDER_SITE_OTHER): Payer: Medicare Other | Admitting: Psychiatry

## 2019-01-15 ENCOUNTER — Encounter: Payer: Self-pay | Admitting: Psychiatry

## 2019-01-15 DIAGNOSIS — F4001 Agoraphobia with panic disorder: Secondary | ICD-10-CM | POA: Diagnosis not present

## 2019-01-15 DIAGNOSIS — I639 Cerebral infarction, unspecified: Secondary | ICD-10-CM | POA: Diagnosis not present

## 2019-01-15 DIAGNOSIS — S060X0D Concussion without loss of consciousness, subsequent encounter: Secondary | ICD-10-CM

## 2019-01-15 DIAGNOSIS — F411 Generalized anxiety disorder: Secondary | ICD-10-CM

## 2019-01-15 DIAGNOSIS — F3181 Bipolar II disorder: Secondary | ICD-10-CM | POA: Diagnosis not present

## 2019-01-15 DIAGNOSIS — G3184 Mild cognitive impairment, so stated: Secondary | ICD-10-CM | POA: Diagnosis not present

## 2019-01-15 DIAGNOSIS — F5105 Insomnia due to other mental disorder: Secondary | ICD-10-CM | POA: Diagnosis not present

## 2019-01-15 MED ORDER — LITHIUM CARBONATE 300 MG PO CAPS: 300.0000 mg | ORAL_CAPSULE | Freq: Every day | ORAL | 1 refills | Status: DC
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
## 2019-01-19 DIAGNOSIS — G43719 Chronic migraine without aura, intractable, without status migrainosus: Secondary | ICD-10-CM | POA: Diagnosis not present

## 2019-01-19 DIAGNOSIS — M791 Myalgia, unspecified site: Secondary | ICD-10-CM | POA: Diagnosis not present

## 2019-01-19 DIAGNOSIS — G518 Other disorders of facial nerve: Secondary | ICD-10-CM | POA: Diagnosis not present

## 2019-01-19 DIAGNOSIS — M542 Cervicalgia: Secondary | ICD-10-CM | POA: Diagnosis not present

## 2019-01-20 ENCOUNTER — Other Ambulatory Visit: Payer: Self-pay | Admitting: Psychiatry

## 2019-01-20 DIAGNOSIS — F411 Generalized anxiety disorder: Secondary | ICD-10-CM

## 2019-01-20 DIAGNOSIS — F4310 Post-traumatic stress disorder, unspecified: Secondary | ICD-10-CM

## 2019-01-21 ENCOUNTER — Ambulatory Visit (INDEPENDENT_AMBULATORY_CARE_PROVIDER_SITE_OTHER): Payer: Medicare Other | Admitting: Psychology

## 2019-01-21 DIAGNOSIS — F3181 Bipolar II disorder: Secondary | ICD-10-CM | POA: Diagnosis not present

## 2019-01-26 DIAGNOSIS — Z79891 Long term (current) use of opiate analgesic: Secondary | ICD-10-CM | POA: Diagnosis not present

## 2019-01-26 DIAGNOSIS — M961 Postlaminectomy syndrome, not elsewhere classified: Secondary | ICD-10-CM | POA: Diagnosis not present

## 2019-01-26 DIAGNOSIS — M47816 Spondylosis without myelopathy or radiculopathy, lumbar region: Secondary | ICD-10-CM | POA: Diagnosis not present

## 2019-01-26 DIAGNOSIS — Z79899 Other long term (current) drug therapy: Secondary | ICD-10-CM | POA: Diagnosis not present

## 2019-01-26 DIAGNOSIS — M545 Low back pain: Secondary | ICD-10-CM | POA: Diagnosis not present

## 2019-01-26 DIAGNOSIS — G894 Chronic pain syndrome: Secondary | ICD-10-CM | POA: Diagnosis not present

## 2019-01-27 ENCOUNTER — Other Ambulatory Visit: Payer: Self-pay | Admitting: Psychiatry

## 2019-01-29 ENCOUNTER — Other Ambulatory Visit: Payer: Self-pay

## 2019-01-29 ENCOUNTER — Encounter: Payer: Self-pay | Admitting: Cardiovascular Disease

## 2019-01-29 ENCOUNTER — Ambulatory Visit (INDEPENDENT_AMBULATORY_CARE_PROVIDER_SITE_OTHER): Payer: Medicare Other | Admitting: *Deleted

## 2019-01-29 ENCOUNTER — Ambulatory Visit (INDEPENDENT_AMBULATORY_CARE_PROVIDER_SITE_OTHER): Payer: Medicare Other | Admitting: Cardiovascular Disease

## 2019-01-29 VITALS — BP 122/60 | HR 67 | Ht 60.0 in | Wt 166.5 lb

## 2019-01-29 DIAGNOSIS — I480 Paroxysmal atrial fibrillation: Secondary | ICD-10-CM

## 2019-01-29 DIAGNOSIS — I639 Cerebral infarction, unspecified: Secondary | ICD-10-CM

## 2019-01-29 DIAGNOSIS — Z5181 Encounter for therapeutic drug level monitoring: Secondary | ICD-10-CM

## 2019-01-29 LAB — POCT INR: INR: 1.5 — AB (ref 2.0–3.0)

## 2019-01-29 NOTE — Patient Instructions (Signed)
Description   Take an extra half tablet today (already took 1.5 tablets this morning) and 1.5 tablets tomorrow,  then start taking 2.5mg  (1 tablet) daily except 3.75mg  (1.5 tablets) on Sundays, Tuesdays and Thursdays. Recheck in 1 week.  Call with any medication changes or procedures: [facility] [phone] Main [phone]

## 2019-01-29 NOTE — Progress Notes (Signed)
Cardiology Office Note   Date:  01/29/2019   ID:  Meredith [name], Meredith [name] [birth year], MRN [id number]  PCP:  Kristen Loader, FNP  Cardiologist:   Mertie Moores, MD   Chief Complaint  Patient presents with  . Atrial Fibrillation   Problem list 1. Paroxysmal atrial fibrillation 2. Hyperlipidemia 3.  Hypothyroidism 4.  Migraine headaches     Meredith [name] is a 68 y.o. female who presents for follow up of her paroxysmal atrial fib.  Previously from  [place], [place]  Has had 2 A. fib ablations and has had a more recent cryoablation. She was on [place] until a year ago .  Has not had any recurrent atrial fibrillation since that time.  She is on verapamil for history of migraine headaches.  She cannot tell when she is in atrial fib.   Usually has a very fast HR  Gets some exercise on rare occasion.     Oct. 4, 2018:    Doing well from a cardiac standpoint. Was in [place] for 5 days Has lots of anxiety   Nov. 4, 2019:  Meredith [name] is seen back today for follow-up of her history of atrial fibrillation.  She is had 3 separate ablations for atrial fibrillation.   She was recently hospitalized for altered mental status.   She had fallen and hit her head on the cement.      She agreed to take apixaban 5 mg twice a day following that hospitalization.   She took it for 30 days and then stopped   Dec. 3, 2020  Pt was hospitalized for acute encephalopathy.   Brain MRI showed no stroke. 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  Is on warfarin now   Past Medical History:  Diagnosis Date  . Anxiety   . Atrial fibrillation ([place])   . Bipolar 2 disorder ([place])   . Depression   . History of cardioversion    x2  . Hyperlipidemia   . Hypothyroidism   . Migraine headache   . Osteoporosis     Past Surgical History:  Procedure Laterality Date  . ABLATION    . BACK SURGERY  2014  .  CARDIOVERSION  12/2013, 01/2014   x2  . KNEE SURGERY     ORTHOSCOPIC  . LUNG REMOVAL, PARTIAL Right    BENIGN LUNG CYST  . THORACIC SPINE SURGERY Right   . THUMB ARTHROSCOPY       Current Outpatient Medications  Medication Sig Dispense Refill  . Ascorbic Acid (VITAMIN C) 1000 MG tablet Take 1,000 mg by mouth daily.    [name] aspirin 325 MG tablet Take 325 mg by mouth daily as needed for headache.     . busPIRone (BUSPAR) 15 MG tablet TAKE 1 TABLET (15 MG TOTAL) BY MOUTH 2 (TWO) TIMES DAILY. 180 tablet 2  . Cholecalciferol (VITAMIN D3) 1000 units CAPS Take 5,000 Units by mouth daily.     . clotrimazole (LOTRIMIN) 1 % cream Apply 1 application topically 2 (two) times daily as needed (skin care).     . diphenhydrAMINE (BENADRYL ALLERGY) 25 mg capsule Take 50 mg by mouth 4 (four) times daily.     Eduard Roux (AIMOVIG) 70 MG/ML SOAJ Inject 70 mg into the skin every 30 (thirty) days.    . ferrous sulfate 325 (65 FE) MG tablet Take 325 mg by mouth 2 (two) times daily.    [name]  lamoTRIgine (LAMICTAL) 100 MG tablet Take 1 tablet by mouth twice daily 180 tablet 0  . levothyroxine (SYNTHROID, LEVOTHROID) 100 MCG tablet Take 1 tablet (100 mcg total) by mouth daily before breakfast. 90 tablet 3  . lithium carbonate 300 MG capsule Take 1 capsule (300 mg total) by mouth at bedtime. 90 capsule 1  . Melatonin 5 MG TABS Take 15 mg by mouth at bedtime.    . metFORMIN (GLUCOPHAGE) 500 MG tablet Take 1 tablet (500 mg total) by mouth 2 (two) times daily with a meal. 360 tablet 0  . metoprolol succinate (TOPROL-XL) 50 MG 24 hr tablet Take 50 mg by mouth daily. Take with or immediately following a meal.    . ondansetron (ZOFRAN) 4 MG tablet Take 1 tablet (4 mg total) by mouth 3 (three) times daily as needed. As needed for nausea 1 tablet 0  . pantoprazole (PROTONIX) 40 MG tablet Take 1 tablet (40 mg total) by mouth daily. 90 tablet 0  . pravastatin (PRAVACHOL) 20 MG tablet Take 20 mg by mouth daily.  3  .  QUEtiapine Fumarate (SEROQUEL XR) 150 MG 24 hr tablet Take 1 tablet (150 mg total) by mouth at bedtime. 90 tablet 2  . senna-docusate (PERI-COLACE) 8.6-50 MG tablet Take 2 tablets by mouth 2 (two) times daily. For constipation    . verapamil (VERELAN PM) 120 MG 24 hr capsule Take 1 capsule (120 mg total) by mouth at bedtime. For high blood pressure    . warfarin (COUMADIN) 2.5 MG tablet TAKE AS DIRECTED BY COUMADIN CLINIC 40 tablet 1  . estradiol (ESTRACE) 1 MG tablet Take 1 mg by mouth daily.     No current facility-administered medications for this visit.     Allergies:   Codeine, Adhesive [tape], Celebrex [celecoxib], Sulfa antibiotics, Tricyclic antidepressants, Amoxicillin, Ampicillin, Cephalexin, and Penicillins    Social History:  The patient  reports that she has never smoked. She has never used smokeless tobacco. She reports that she does not drink alcohol or use drugs.   Family History:  The patient's family history includes Alcohol abuse in her brother; Arthritis in her mother; Asthma in her brother; Atrial fibrillation in her brother; Depression in her mother; Diabetes in her mother; Drug abuse in her brother; Early death in her father; Heart disease in her father and mother; Hyperlipidemia in her brother; Hypertension in her brother; Mental illness in her brother; Multiple sclerosis in her sister; Stroke in her mother.    ROS:  Please see the history of present illness.    Physical Exam: Blood pressure 122/60, pulse 67, height 5' (1.524 m), weight 166 lb 8 oz (75.5 kg), SpO2 97 %.  GEN:  Well nourished, well developed in no acute distress HEENT: Normal NECK: No JVD; No carotid bruits LYMPHATICS: No lymphadenopathy CARDIAC: RRR , no murmurs, rubs, gallops RESPIRATORY:  Clear to auscultation without rales, wheezing or rhonchi  ABDOMEN: Soft, non-tender, non-distended MUSCULOSKELETAL:  No edema; No deformity  SKIN: Warm and dry NEUROLOGIC:  Alert and oriented x  3     EKG:       Recent Labs: 10/22/2018: ALT 14 10/23/2018: TSH 4.237 10/27/2018: BUN 17; Creatinine, Ser 1.06; Hemoglobin 12.9; Platelets 235; Potassium 3.4; Sodium 140    Lipid Panel    Component Value Date/Time   CHOL 212 (H) 10/23/2018 0416   TRIG 103 10/23/2018 0416   HDL 94 10/23/2018 0416   CHOLHDL 2.3 10/23/2018 0416   VLDL 21 10/23/2018 0416   LDLCALC  97 10/23/2018 0416      Wt Readings from Last 3 Encounters:  01/29/19 166 lb 8 oz (75.5 kg)  10/19/18 173 lb (78.5 kg)  04/29/18 195 lb 8 oz (88.7 kg)      Other studies Reviewed: Additional studies/ records that were reviewed today include: . Review of the above records demonstrates:    ASSESSMENT AND PLAN:  1.  Paroxysmal atrial fib:   Has had 3 a-fib ablations  ( 2 regular ablations and 1 cryo- ablation)  CHADS2VASC = 2  ( female, 12)   She took Eliquis for 1 month and then was not able to afford the medication following that month trial.   She is now on warfarin.   Cont recs from coumadin clinic .    Current medicines are reviewed at length with the patient today.  The patient does not have concerns regarding medicines.  Labs/ tests ordered today include:  No orders of the defined types were placed in this encounter.   Disposition:   FU with  Me in 1 year.    Mertie Moores, MD  01/29/2019 5:51 PM    [address], [place], [place]  [address] Phone: [phone]; Fax: [phone]   [facility]  [address] [place], [place]  [address] [phone]   Fax [phone]

## 2019-01-29 NOTE — Patient Instructions (Signed)
Medication Instructions:  Your physician recommends that you continue on your current medications as directed. Please refer to the Current Medication list given to you today.  *If you need a refill on your cardiac medications before your next appointment, please call your pharmacy*  Lab Work: None Ordered   Testing/Procedures: None Ordered   Follow-Up: At [facility], you and your health needs are our priority.  As part of our continuing mission to provide you with exceptional heart care, we have created designated Provider Care Teams.  These Care Teams include your primary Cardiologist (physician) and Advanced Practice Providers (APPs -  Physician Assistants and Nurse Practitioners) who all work together to provide you with the care you need, when you need it.  Your next appointment:   1 year(s)  The format for your next appointment:   In Person  Provider:   You may see Mertie Moores, MD or one of the following Advanced Practice Providers on your designated Care Team:    Richardson Dopp, PA-C  [place], [place]  Daune Perch, [place]

## 2019-02-04 ENCOUNTER — Ambulatory Visit (INDEPENDENT_AMBULATORY_CARE_PROVIDER_SITE_OTHER): Payer: Medicare Other | Admitting: Psychology

## 2019-02-04 DIAGNOSIS — F3181 Bipolar II disorder: Secondary | ICD-10-CM | POA: Diagnosis not present

## 2019-02-05 ENCOUNTER — Other Ambulatory Visit: Payer: Self-pay

## 2019-02-05 ENCOUNTER — Ambulatory Visit (INDEPENDENT_AMBULATORY_CARE_PROVIDER_SITE_OTHER): Payer: Medicare Other | Admitting: *Deleted

## 2019-02-05 DIAGNOSIS — I480 Paroxysmal atrial fibrillation: Secondary | ICD-10-CM | POA: Diagnosis not present

## 2019-02-05 DIAGNOSIS — Z5181 Encounter for therapeutic drug level monitoring: Secondary | ICD-10-CM | POA: Diagnosis not present

## 2019-02-05 LAB — POCT INR: INR: 2 (ref 2.0–3.0)

## 2019-02-05 NOTE — Patient Instructions (Signed)
Description   Continue taking 2.5mg  (1 tablet) daily except 3.75mg  (1.5 tablets) on Sundays, Tuesdays and Thursdays. Recheck in 2-3 week.  Call with any medication changes or procedures: Coumadin Clinic [phone] Main [phone]

## 2019-02-10 ENCOUNTER — Other Ambulatory Visit: Payer: Self-pay | Admitting: Cardiovascular Disease

## 2019-02-11 ENCOUNTER — Telehealth: Payer: Self-pay | Admitting: *Deleted

## 2019-02-11 NOTE — Telephone Encounter (Signed)
   [facility] Pre-operative Risk Assessment    Request for surgical clearance:  1. What type of surgery is being performed? SPINAL CORD STIMULATOR TRIAL   2. When is this surgery scheduled? TBD   3. What type of clearance is required (medical clearance vs. Pharmacy clearance to hold med vs. Both)? BOTH  4. Are there any medications that need to be held prior to surgery and how long? WARFARIN X 5 DAYS PRIOR TO PROCEDURE  5. Practice name and name of physician performing surgery? [facility]; DR. DAVID SPIVEY   6. What is your office phone number [phone]    7.   What is your office fax number [phone]  8.   Anesthesia type (None, local, MAC, general) ? NOT LISTED   Julaine Hua 02/11/2019, 3:43 PM  _________________________________________________________________   (provider comments below)

## 2019-02-11 NOTE — Telephone Encounter (Signed)
   Primary Cardiologist: Mertie Moores, MD  Chart reviewed as part of pre-operative protocol coverage. WANEDA CAPULONG was last seen on 01/29/2019 by Dr. Acie Fredrickson. LASHUNA GUNNARSON was doing well from a cardiac standpoint at that time with no CV complaints.   Therefore, based on ACC/AHA guidelines, the patient would be at acceptable risk for the planned procedure without further cardiovascular testing.   Patient with diagnosis of afib on warfarin for anticoagulation.    Procedure: SPINAL CORD STIMULATOR TRIAL Date of procedure: TBD  CHADS2-VASc score of  2 (AGE, female)  Of note patient was admitted in August for what was thought to be a stroke, but MRI revealed no acute infarct and was determined to be lithium toxicity  Per office protocol, patient can hold warfarin for 5 days prior to procedure.    Patient will NOT need bridging with Lovenox (enoxaparin) around procedure.  I will route this recommendation to the requesting party via Epic fax function and remove from pre-op pool.  Please call with questions.  Kathyrn Drown, NP 02/11/2019, 4:27 PM

## 2019-02-11 NOTE — Telephone Encounter (Signed)
Patient with diagnosis of afib on warfarin for anticoagulation.    Procedure: SPINAL CORD STIMULATOR TRIAL  Date of procedure: TBD  CHADS2-VASc score of  2 (AGE, female)  Of note patient was admitted in August for what was thought to be a stroke, but MRI revealed no acute infarct and was determined to be lithium toxicity  Per office protocol, patient can hold warfarin for 5 days prior to procedure.    Patient will NOT need bridging with Lovenox (enoxaparin) around procedure.

## 2019-02-18 ENCOUNTER — Ambulatory Visit (INDEPENDENT_AMBULATORY_CARE_PROVIDER_SITE_OTHER): Payer: Medicare Other | Admitting: Psychology

## 2019-02-18 DIAGNOSIS — F3181 Bipolar II disorder: Secondary | ICD-10-CM | POA: Diagnosis not present

## 2019-02-23 ENCOUNTER — Other Ambulatory Visit: Payer: Self-pay

## 2019-02-23 ENCOUNTER — Ambulatory Visit (INDEPENDENT_AMBULATORY_CARE_PROVIDER_SITE_OTHER): Payer: Medicare Other | Admitting: *Deleted

## 2019-02-23 DIAGNOSIS — I480 Paroxysmal atrial fibrillation: Secondary | ICD-10-CM | POA: Diagnosis not present

## 2019-02-23 DIAGNOSIS — Z5181 Encounter for therapeutic drug level monitoring: Secondary | ICD-10-CM | POA: Diagnosis not present

## 2019-02-23 LAB — POCT INR: INR: 1.8 — AB (ref 2.0–3.0)

## 2019-02-23 NOTE — Patient Instructions (Addendum)
Description   Start taking 3.75 mg ( 1.5 tablets) daily except (2.5mg ) 1 tablet on Tuesday, Thursday and Saturday. Hold Coumadin starting on 1/8 for 5 days for procedure. Resume Coumadin in the evening of the procedure or as directed by doctor. Recheck INR in 2 weeks.Call with any medication changes or procedures: Coumadin Clinic [phone] Main [phone]

## 2019-02-24 ENCOUNTER — Other Ambulatory Visit: Payer: Self-pay | Admitting: Cardiovascular Disease

## 2019-03-03 ENCOUNTER — Other Ambulatory Visit: Payer: Self-pay

## 2019-03-03 ENCOUNTER — Encounter: Payer: Self-pay | Admitting: Psychiatry

## 2019-03-03 ENCOUNTER — Ambulatory Visit (INDEPENDENT_AMBULATORY_CARE_PROVIDER_SITE_OTHER): Payer: Medicare Other | Admitting: Psychiatry

## 2019-03-03 DIAGNOSIS — F5105 Insomnia due to other mental disorder: Secondary | ICD-10-CM | POA: Diagnosis not present

## 2019-03-03 DIAGNOSIS — F3181 Bipolar II disorder: Secondary | ICD-10-CM | POA: Diagnosis not present

## 2019-03-03 DIAGNOSIS — F4001 Agoraphobia with panic disorder: Secondary | ICD-10-CM

## 2019-03-03 DIAGNOSIS — F411 Generalized anxiety disorder: Secondary | ICD-10-CM | POA: Diagnosis not present

## 2019-03-03 DIAGNOSIS — F4310 Post-traumatic stress disorder, unspecified: Secondary | ICD-10-CM | POA: Diagnosis not present

## 2019-03-03 DIAGNOSIS — G3184 Mild cognitive impairment, so stated: Secondary | ICD-10-CM | POA: Diagnosis not present

## 2019-03-03 DIAGNOSIS — S060X0D Concussion without loss of consciousness, subsequent encounter: Secondary | ICD-10-CM
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
## 2019-03-04 ENCOUNTER — Ambulatory Visit (INDEPENDENT_AMBULATORY_CARE_PROVIDER_SITE_OTHER): Payer: Medicare Other | Admitting: Psychology

## 2019-03-04 DIAGNOSIS — F3181 Bipolar II disorder: Secondary | ICD-10-CM | POA: Diagnosis not present

## 2019-03-06 ENCOUNTER — Other Ambulatory Visit: Payer: Self-pay

## 2019-03-06 ENCOUNTER — Ambulatory Visit (INDEPENDENT_AMBULATORY_CARE_PROVIDER_SITE_OTHER): Payer: Medicare Other | Admitting: *Deleted

## 2019-03-06 DIAGNOSIS — Z5181 Encounter for therapeutic drug level monitoring: Secondary | ICD-10-CM | POA: Diagnosis not present

## 2019-03-06 DIAGNOSIS — I480 Paroxysmal atrial fibrillation: Secondary | ICD-10-CM | POA: Diagnosis not present

## 2019-03-06 LAB — POCT INR: INR: 1.4 — AB (ref 2.0–3.0)

## 2019-03-06 NOTE — Patient Instructions (Signed)
Description   Hold Coumadin starting on 1/8 for 5 days for procedure on 1/13. Resume Coumadin in the evening of the procedure or as directed by doctor. Normal dose 1.5 tablets daily except for 1 tablet on Sunday, Tuesday and Thursday  Recheck INR 1 week after the procedure. Call with any medication changes. Coumadin Clinic [phone] Main [phone]

## 2019-03-11 ENCOUNTER — Other Ambulatory Visit: Payer: Self-pay | Admitting: Cardiovascular Disease

## 2019-03-11 DIAGNOSIS — M47816 Spondylosis without myelopathy or radiculopathy, lumbar region: Secondary | ICD-10-CM | POA: Diagnosis not present

## 2019-03-11 DIAGNOSIS — M545 Low back pain: Secondary | ICD-10-CM | POA: Diagnosis not present

## 2019-03-11 DIAGNOSIS — M961 Postlaminectomy syndrome, not elsewhere classified: Secondary | ICD-10-CM | POA: Diagnosis not present

## 2019-03-11 DIAGNOSIS — G894 Chronic pain syndrome: Secondary | ICD-10-CM | POA: Diagnosis not present

## 2019-03-17 DIAGNOSIS — G518 Other disorders of facial nerve: Secondary | ICD-10-CM | POA: Diagnosis not present

## 2019-03-17 DIAGNOSIS — M542 Cervicalgia: Secondary | ICD-10-CM | POA: Diagnosis not present

## 2019-03-17 DIAGNOSIS — G43719 Chronic migraine without aura, intractable, without status migrainosus: Secondary | ICD-10-CM | POA: Diagnosis not present

## 2019-03-17 DIAGNOSIS — M791 Myalgia, unspecified site: Secondary | ICD-10-CM | POA: Diagnosis not present

## 2019-03-18 ENCOUNTER — Ambulatory Visit (INDEPENDENT_AMBULATORY_CARE_PROVIDER_SITE_OTHER): Payer: Medicare Other | Admitting: Psychology

## 2019-03-18 DIAGNOSIS — F3181 Bipolar II disorder: Secondary | ICD-10-CM | POA: Diagnosis not present

## 2019-03-19 ENCOUNTER — Ambulatory Visit (INDEPENDENT_AMBULATORY_CARE_PROVIDER_SITE_OTHER): Payer: Medicare Other | Admitting: *Deleted

## 2019-03-19 ENCOUNTER — Ambulatory Visit: Payer: Medicare Other | Attending: Internal Medicine

## 2019-03-19 ENCOUNTER — Other Ambulatory Visit: Payer: Self-pay

## 2019-03-19 DIAGNOSIS — I480 Paroxysmal atrial fibrillation: Secondary | ICD-10-CM | POA: Diagnosis not present

## 2019-03-19 DIAGNOSIS — Z23 Encounter for immunization: Secondary | ICD-10-CM

## 2019-03-19 DIAGNOSIS — Z5181 Encounter for therapeutic drug level monitoring: Secondary | ICD-10-CM

## 2019-03-19 LAB — POCT INR: INR: 1.5 — AB (ref 2.0–3.0)

## 2019-03-19 NOTE — Progress Notes (Signed)
   Covid-19 Vaccination Clinic  Name:  Meredith [name]    MRN: [id number] DOB: 05/29/1950  03/19/2019  Meredith [name] was observed post Covid-19 immunization for 15 minutes without incidence. She was provided with Vaccine Information Sheet and instruction to access the V-Safe system.   Meredith [name] was instructed to call 911 with any severe reactions post vaccine: [name] Difficulty breathing  . Swelling of your face and throat  . A fast heartbeat  . A bad rash all over your body  . Dizziness and weakness    Immunizations Administered    Name Date Dose VIS Date Route   Pfizer COVID-19 Vaccine 03/19/2019  8:18 AM 0.3 mL 02/06/2019 Intramuscular   Manufacturer: [place]   Lot: [id number]   [place]: [id number]

## 2019-03-19 NOTE — Patient Instructions (Addendum)
Description    Take 1.5 tablets today and 2 tablets tomorrow, then continue same dose 1.5 tablets daily except for 1 tablet on Sunday, Tuesday and Thursday.  Recheck INR in 11 days. Call with any medication changes. Coumadin Clinic [phone] Main [phone]

## 2019-03-20 DIAGNOSIS — N183 Chronic kidney disease, stage 3 unspecified: Secondary | ICD-10-CM | POA: Diagnosis not present

## 2019-03-26 ENCOUNTER — Other Ambulatory Visit: Payer: Self-pay | Admitting: Family Medicine

## 2019-03-30 ENCOUNTER — Ambulatory Visit (INDEPENDENT_AMBULATORY_CARE_PROVIDER_SITE_OTHER): Payer: Medicare Other

## 2019-03-30 ENCOUNTER — Other Ambulatory Visit: Payer: Self-pay

## 2019-03-30 DIAGNOSIS — Z5181 Encounter for therapeutic drug level monitoring: Secondary | ICD-10-CM | POA: Diagnosis not present

## 2019-03-30 DIAGNOSIS — I480 Paroxysmal atrial fibrillation: Secondary | ICD-10-CM

## 2019-03-30 LAB — POCT INR: INR: 1.6 — AB (ref 2.0–3.0)

## 2019-03-30 NOTE — Patient Instructions (Signed)
Description    Take 2 tablets tomorrow, then start taking 1.5 tablets daily except for 1 tablet on Sundays and Thursdays.  Recheck INR in 2 weeks. Call with any medication changes. Coumadin Clinic [phone] Main [phone]

## 2019-04-01 ENCOUNTER — Ambulatory Visit (INDEPENDENT_AMBULATORY_CARE_PROVIDER_SITE_OTHER): Payer: Medicare Other | Admitting: Psychology

## 2019-04-01 DIAGNOSIS — F3181 Bipolar II disorder: Secondary | ICD-10-CM

## 2019-04-08 ENCOUNTER — Ambulatory Visit: Payer: Medicare Other | Attending: Internal Medicine

## 2019-04-08 DIAGNOSIS — Z23 Encounter for immunization: Secondary | ICD-10-CM | POA: Insufficient documentation

## 2019-04-08 NOTE — Progress Notes (Signed)
   Covid-19 Vaccination Clinic  Name:  Meredith [name]    MRN: [id number] DOB: Jun 11, 1950  04/08/2019  [name] was observed post Covid-19 immunization for 15 minutes without incidence. She was provided with Vaccine Information Sheet and instruction to access the V-Safe system.   [name] was instructed to call 911 with any severe reactions post vaccine: [name] Difficulty breathing  . Swelling of your face and throat  . A fast heartbeat  . A bad rash all over your body  . Dizziness and weakness    Immunizations Administered    Name Date Dose VIS Date Route   Pfizer COVID-19 Vaccine 04/08/2019 11:02 AM 0.3 mL 02/06/2019 Intramuscular   Manufacturer: [employer]   Lot: [id number]   [place]: [id number]

## 2019-04-13 ENCOUNTER — Other Ambulatory Visit: Payer: Self-pay

## 2019-04-13 ENCOUNTER — Ambulatory Visit (INDEPENDENT_AMBULATORY_CARE_PROVIDER_SITE_OTHER): Payer: Medicare Other

## 2019-04-13 DIAGNOSIS — Z5181 Encounter for therapeutic drug level monitoring: Secondary | ICD-10-CM | POA: Diagnosis not present

## 2019-04-13 DIAGNOSIS — I480 Paroxysmal atrial fibrillation: Secondary | ICD-10-CM | POA: Diagnosis not present

## 2019-04-13 LAB — POCT INR: INR: 1.9 — AB (ref 2.0–3.0)

## 2019-04-13 NOTE — Patient Instructions (Signed)
Description   Start taking 1.5 tablets daily except for 1 tablet on Sundays.  Recheck INR in 3 weeks. Call with any medication changes. Coumadin Clinic [phone] Main [phone]

## 2019-04-14 DIAGNOSIS — M542 Cervicalgia: Secondary | ICD-10-CM | POA: Diagnosis not present

## 2019-04-14 DIAGNOSIS — M791 Myalgia, unspecified site: Secondary | ICD-10-CM | POA: Diagnosis not present

## 2019-04-14 DIAGNOSIS — G518 Other disorders of facial nerve: Secondary | ICD-10-CM | POA: Diagnosis not present

## 2019-04-14 DIAGNOSIS — G43719 Chronic migraine without aura, intractable, without status migrainosus: Secondary | ICD-10-CM | POA: Diagnosis not present

## 2019-04-15 ENCOUNTER — Ambulatory Visit (INDEPENDENT_AMBULATORY_CARE_PROVIDER_SITE_OTHER): Payer: Medicare Other | Admitting: Psychology

## 2019-04-15 DIAGNOSIS — F3181 Bipolar II disorder: Secondary | ICD-10-CM | POA: Diagnosis not present

## 2019-04-23 ENCOUNTER — Other Ambulatory Visit: Payer: Self-pay | Admitting: Psychiatry

## 2019-04-23 DIAGNOSIS — F3131 Bipolar disorder, current episode depressed, mild: Secondary | ICD-10-CM

## 2019-04-27 DIAGNOSIS — G894 Chronic pain syndrome: Secondary | ICD-10-CM | POA: Diagnosis not present

## 2019-04-27 DIAGNOSIS — Z79891 Long term (current) use of opiate analgesic: Secondary | ICD-10-CM | POA: Diagnosis not present

## 2019-04-27 DIAGNOSIS — M545 Low back pain: Secondary | ICD-10-CM | POA: Diagnosis not present

## 2019-04-27 DIAGNOSIS — Z79899 Other long term (current) drug therapy: Secondary | ICD-10-CM | POA: Diagnosis not present

## 2019-04-27 DIAGNOSIS — M961 Postlaminectomy syndrome, not elsewhere classified: Secondary | ICD-10-CM | POA: Diagnosis not present

## 2019-04-27 DIAGNOSIS — M47816 Spondylosis without myelopathy or radiculopathy, lumbar region: Secondary | ICD-10-CM | POA: Diagnosis not present

## 2019-04-29 ENCOUNTER — Ambulatory Visit (INDEPENDENT_AMBULATORY_CARE_PROVIDER_SITE_OTHER): Payer: Medicare Other | Admitting: Psychology

## 2019-04-29 DIAGNOSIS — F3181 Bipolar II disorder: Secondary | ICD-10-CM

## 2019-05-04 ENCOUNTER — Ambulatory Visit (INDEPENDENT_AMBULATORY_CARE_PROVIDER_SITE_OTHER): Payer: Medicare Other | Admitting: *Deleted

## 2019-05-04 ENCOUNTER — Other Ambulatory Visit: Payer: Self-pay

## 2019-05-04 DIAGNOSIS — Z5181 Encounter for therapeutic drug level monitoring: Secondary | ICD-10-CM | POA: Diagnosis not present

## 2019-05-04 DIAGNOSIS — I480 Paroxysmal atrial fibrillation: Secondary | ICD-10-CM

## 2019-05-04 LAB — POCT INR: INR: 1.7 — AB (ref 2.0–3.0)

## 2019-05-04 NOTE — Patient Instructions (Signed)
Description    Take 2 tablets today and then start taking 1.5 tablets daily. Recheck INR in 2 weeks. Call with any medication changes. Coumadin Clinic [phone] Main [phone]

## 2019-05-12 ENCOUNTER — Ambulatory Visit (INDEPENDENT_AMBULATORY_CARE_PROVIDER_SITE_OTHER): Payer: Medicare Other | Admitting: Psychiatry

## 2019-05-12 ENCOUNTER — Other Ambulatory Visit: Payer: Self-pay

## 2019-05-12 ENCOUNTER — Encounter: Payer: Self-pay | Admitting: Psychiatry

## 2019-05-12 DIAGNOSIS — Z79899 Other long term (current) drug therapy: Secondary | ICD-10-CM | POA: Diagnosis not present

## 2019-05-12 DIAGNOSIS — F5105 Insomnia due to other mental disorder: Secondary | ICD-10-CM | POA: Diagnosis not present

## 2019-05-12 DIAGNOSIS — F411 Generalized anxiety disorder: Secondary | ICD-10-CM

## 2019-05-12 DIAGNOSIS — F3181 Bipolar II disorder: Secondary | ICD-10-CM

## 2019-05-12 DIAGNOSIS — G3184 Mild cognitive impairment, so stated: Secondary | ICD-10-CM | POA: Diagnosis not present

## 2019-05-12 DIAGNOSIS — F4001 Agoraphobia with panic disorder: Secondary | ICD-10-CM

## 2019-05-12 DIAGNOSIS — F4310 Post-traumatic stress disorder, unspecified: Secondary | ICD-10-CM | POA: Diagnosis not present

## 2019-05-12 MED ORDER — LITHIUM CARBONATE 150 MG PO CAPS: 150.0000 mg | ORAL_CAPSULE | Freq: Every day | ORAL | 0 refills | Status: DC
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
## 2019-05-12 NOTE — Patient Instructions (Signed)
Increase lithium to 450 mg nighty either as one 300 mg +1 150 mg capsule Or 3 of the 150 mg capsules  Wait 1 week and then get the blood test in the morning

## 2019-05-13 ENCOUNTER — Ambulatory Visit (INDEPENDENT_AMBULATORY_CARE_PROVIDER_SITE_OTHER): Payer: Medicare Other | Admitting: Psychology

## 2019-05-13 DIAGNOSIS — F3181 Bipolar II disorder: Secondary | ICD-10-CM | POA: Diagnosis not present

## 2019-05-15 DIAGNOSIS — M542 Cervicalgia: Secondary | ICD-10-CM | POA: Diagnosis not present

## 2019-05-15 DIAGNOSIS — G518 Other disorders of facial nerve: Secondary | ICD-10-CM | POA: Diagnosis not present

## 2019-05-15 DIAGNOSIS — M791 Myalgia, unspecified site: Secondary | ICD-10-CM | POA: Diagnosis not present

## 2019-05-15 DIAGNOSIS — G43719 Chronic migraine without aura, intractable, without status migrainosus: Secondary | ICD-10-CM | POA: Diagnosis not present

## 2019-05-18 ENCOUNTER — Other Ambulatory Visit: Payer: Self-pay

## 2019-05-18 ENCOUNTER — Ambulatory Visit (INDEPENDENT_AMBULATORY_CARE_PROVIDER_SITE_OTHER): Payer: Medicare Other | Admitting: *Deleted

## 2019-05-18 DIAGNOSIS — I480 Paroxysmal atrial fibrillation: Secondary | ICD-10-CM

## 2019-05-18 DIAGNOSIS — Z5181 Encounter for therapeutic drug level monitoring: Secondary | ICD-10-CM | POA: Diagnosis not present

## 2019-05-18 LAB — POCT INR: INR: 2 (ref 2.0–3.0)

## 2019-05-18 NOTE — Patient Instructions (Signed)
Description   Start taking 1.5 tablets daily except for 2 tablets on Mondays. Recheck INR in 3 weeks. Call with any medication changes. [facility] [phone] Main [phone]

## 2019-05-20 DIAGNOSIS — Z79899 Other long term (current) drug therapy: Secondary | ICD-10-CM | POA: Diagnosis not present

## 2019-05-20 DIAGNOSIS — F3181 Bipolar II disorder: Secondary | ICD-10-CM | POA: Diagnosis not present

## 2019-05-25 NOTE — Progress Notes (Signed)
BMP is good.  Lithium level 0.9 stable.  No indication for med change.

## 2019-05-26 DIAGNOSIS — M47814 Spondylosis without myelopathy or radiculopathy, thoracic region: Secondary | ICD-10-CM | POA: Diagnosis not present

## 2019-05-26 DIAGNOSIS — G894 Chronic pain syndrome: Secondary | ICD-10-CM | POA: Diagnosis not present

## 2019-05-26 DIAGNOSIS — M797 Fibromyalgia: Secondary | ICD-10-CM | POA: Diagnosis not present

## 2019-05-26 DIAGNOSIS — M47816 Spondylosis without myelopathy or radiculopathy, lumbar region: Secondary | ICD-10-CM | POA: Diagnosis not present

## 2019-05-27 ENCOUNTER — Ambulatory Visit (INDEPENDENT_AMBULATORY_CARE_PROVIDER_SITE_OTHER): Payer: Medicare Other | Admitting: Psychology

## 2019-05-27 DIAGNOSIS — F3181 Bipolar II disorder: Secondary | ICD-10-CM | POA: Diagnosis not present

## 2019-06-05 ENCOUNTER — Other Ambulatory Visit: Payer: Self-pay | Admitting: Family Medicine

## 2019-06-07 ENCOUNTER — Other Ambulatory Visit: Payer: Self-pay | Admitting: Cardiovascular Disease

## 2019-06-08 ENCOUNTER — Ambulatory Visit (INDEPENDENT_AMBULATORY_CARE_PROVIDER_SITE_OTHER): Payer: Medicare Other

## 2019-06-08 ENCOUNTER — Other Ambulatory Visit: Payer: Self-pay

## 2019-06-08 DIAGNOSIS — Z5181 Encounter for therapeutic drug level monitoring: Secondary | ICD-10-CM

## 2019-06-08 DIAGNOSIS — I480 Paroxysmal atrial fibrillation: Secondary | ICD-10-CM | POA: Diagnosis not present

## 2019-06-08 LAB — POCT INR: INR: 2 (ref 2.0–3.0)

## 2019-06-08 NOTE — Patient Instructions (Signed)
Description   Continue on same dosage 1.5 tablets daily except for 2 tablets on Mondays. Recheck INR in 4 weeks. Call with any medication changes. [facility] [phone] Main [phone]

## 2019-06-10 ENCOUNTER — Ambulatory Visit (INDEPENDENT_AMBULATORY_CARE_PROVIDER_SITE_OTHER): Payer: Medicare Other | Admitting: Psychology

## 2019-06-10 DIAGNOSIS — F3181 Bipolar II disorder: Secondary | ICD-10-CM | POA: Diagnosis not present

## 2019-06-15 ENCOUNTER — Other Ambulatory Visit: Payer: Self-pay | Admitting: Family Medicine

## 2019-06-15 DIAGNOSIS — G43719 Chronic migraine without aura, intractable, without status migrainosus: Secondary | ICD-10-CM | POA: Diagnosis not present

## 2019-06-15 DIAGNOSIS — M542 Cervicalgia: Secondary | ICD-10-CM | POA: Diagnosis not present

## 2019-06-15 DIAGNOSIS — G518 Other disorders of facial nerve: Secondary | ICD-10-CM | POA: Diagnosis not present

## 2019-06-15 DIAGNOSIS — M791 Myalgia, unspecified site: Secondary | ICD-10-CM | POA: Diagnosis not present

## 2019-06-15 DIAGNOSIS — Z1231 Encounter for screening mammogram for malignant neoplasm of breast: Secondary | ICD-10-CM

## 2019-06-23 ENCOUNTER — Other Ambulatory Visit: Payer: Self-pay

## 2019-06-23 ENCOUNTER — Ambulatory Visit (INDEPENDENT_AMBULATORY_CARE_PROVIDER_SITE_OTHER): Payer: Medicare Other | Admitting: Psychiatry

## 2019-06-23 ENCOUNTER — Encounter: Payer: Self-pay | Admitting: Psychiatry

## 2019-06-23 DIAGNOSIS — Z79899 Other long term (current) drug therapy: Secondary | ICD-10-CM

## 2019-06-23 DIAGNOSIS — F411 Generalized anxiety disorder: Secondary | ICD-10-CM | POA: Diagnosis not present

## 2019-06-23 DIAGNOSIS — F4310 Post-traumatic stress disorder, unspecified: Secondary | ICD-10-CM | POA: Diagnosis not present

## 2019-06-23 DIAGNOSIS — F3132 Bipolar disorder, current episode depressed, moderate: Secondary | ICD-10-CM | POA: Diagnosis not present

## 2019-06-23 DIAGNOSIS — F4001 Agoraphobia with panic disorder: Secondary | ICD-10-CM

## 2019-06-23 DIAGNOSIS — G3184 Mild cognitive impairment, so stated: Secondary | ICD-10-CM | POA: Diagnosis not present

## 2019-06-23 DIAGNOSIS — F319 Bipolar disorder, unspecified: Secondary | ICD-10-CM

## 2019-06-23 MED ORDER — QUETIAPINE FUMARATE ER 300 MG PO TB24: 300.0000 mg | ORAL_TABLET | Freq: Every day | ORAL | 0 refills | Status: DC

## 2019-06-23 MED ORDER — BUSPIRONE HCL 15 MG PO TABS: 30.0000 mg | ORAL_TABLET | Freq: Two times a day (BID) | ORAL | 0 refills | Status: DC

## 2019-06-23 NOTE — Patient Instructions (Addendum)
Increase buspirone to 1-1/2 tablets twice daily for 1 week then 2 twice daily  Increase Seroquel XR to 2 of the 150 mg tablets each evening or 300 mg total each evening  Recommend [facility], Dr. Harold Hedge [facility] [address] [place], [place]  [address] [phone] (phone)
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
## 2019-06-24 ENCOUNTER — Ambulatory Visit (INDEPENDENT_AMBULATORY_CARE_PROVIDER_SITE_OTHER): Payer: Medicare Other | Admitting: Psychology

## 2019-06-24 DIAGNOSIS — F3181 Bipolar II disorder: Secondary | ICD-10-CM

## 2019-06-25 DIAGNOSIS — G894 Chronic pain syndrome: Secondary | ICD-10-CM | POA: Diagnosis not present

## 2019-06-25 DIAGNOSIS — Z79891 Long term (current) use of opiate analgesic: Secondary | ICD-10-CM | POA: Diagnosis not present

## 2019-06-25 DIAGNOSIS — M47814 Spondylosis without myelopathy or radiculopathy, thoracic region: Secondary | ICD-10-CM | POA: Diagnosis not present

## 2019-06-25 DIAGNOSIS — Z79899 Other long term (current) drug therapy: Secondary | ICD-10-CM | POA: Diagnosis not present

## 2019-06-25 DIAGNOSIS — M47816 Spondylosis without myelopathy or radiculopathy, lumbar region: Secondary | ICD-10-CM | POA: Diagnosis not present

## 2019-06-25 DIAGNOSIS — M797 Fibromyalgia: Secondary | ICD-10-CM | POA: Diagnosis not present

## 2019-07-06 ENCOUNTER — Ambulatory Visit (INDEPENDENT_AMBULATORY_CARE_PROVIDER_SITE_OTHER): Payer: Medicare Other | Admitting: *Deleted

## 2019-07-06 ENCOUNTER — Other Ambulatory Visit: Payer: Self-pay

## 2019-07-06 DIAGNOSIS — Z5181 Encounter for therapeutic drug level monitoring: Secondary | ICD-10-CM | POA: Diagnosis not present

## 2019-07-06 DIAGNOSIS — I639 Cerebral infarction, unspecified: Secondary | ICD-10-CM

## 2019-07-06 DIAGNOSIS — I480 Paroxysmal atrial fibrillation: Secondary | ICD-10-CM

## 2019-07-06 LAB — POCT INR: INR: 2.4 (ref 2.0–3.0)

## 2019-07-06 NOTE — Patient Instructions (Signed)
Description   Continue on same dosage 1.5 tablets daily except for 2 tablets on Mondays. Recheck INR in 5 weeks. Call with any medication changes. [facility] [phone] Main [phone]

## 2019-07-07 DIAGNOSIS — J3089 Other allergic rhinitis: Secondary | ICD-10-CM | POA: Diagnosis not present

## 2019-07-07 DIAGNOSIS — T63481D Toxic effect of venom of other arthropod, accidental (unintentional), subsequent encounter: Secondary | ICD-10-CM | POA: Diagnosis not present

## 2019-07-08 ENCOUNTER — Ambulatory Visit (INDEPENDENT_AMBULATORY_CARE_PROVIDER_SITE_OTHER): Payer: Medicare Other | Admitting: Psychology

## 2019-07-08 DIAGNOSIS — F3181 Bipolar II disorder: Secondary | ICD-10-CM | POA: Diagnosis not present

## 2019-07-15 ENCOUNTER — Telehealth: Payer: Self-pay | Admitting: Psychiatry

## 2019-07-15 ENCOUNTER — Inpatient Hospital Stay (HOSPITAL_COMMUNITY)
Admission: EM | Admit: 2019-07-15 | Discharge: 2019-07-20 | DRG: 091 | Disposition: A | Discharge status: Home Health | Payer: Medicare Other | Attending: Family Medicine | Admitting: Family Medicine

## 2019-07-15 ENCOUNTER — Encounter (HOSPITAL_COMMUNITY): Payer: Self-pay | Admitting: Emergency Medicine

## 2019-07-15 ENCOUNTER — Emergency Department (HOSPITAL_COMMUNITY): Payer: Medicare Other

## 2019-07-15 ENCOUNTER — Inpatient Hospital Stay (HOSPITAL_COMMUNITY): Payer: Medicare Other

## 2019-07-15 DIAGNOSIS — A0472 Enterocolitis due to Clostridium difficile, not specified as recurrent: Secondary | ICD-10-CM | POA: Diagnosis not present

## 2019-07-15 DIAGNOSIS — E876 Hypokalemia: Secondary | ICD-10-CM | POA: Diagnosis not present

## 2019-07-15 DIAGNOSIS — F4310 Post-traumatic stress disorder, unspecified: Secondary | ICD-10-CM | POA: Diagnosis present

## 2019-07-15 DIAGNOSIS — E039 Hypothyroidism, unspecified: Secondary | ICD-10-CM | POA: Diagnosis present

## 2019-07-15 DIAGNOSIS — Z91048 Other nonmedicinal substance allergy status: Secondary | ICD-10-CM

## 2019-07-15 DIAGNOSIS — Z9189 Other specified personal risk factors, not elsewhere classified: Secondary | ICD-10-CM | POA: Diagnosis not present

## 2019-07-15 DIAGNOSIS — E118 Type 2 diabetes mellitus with unspecified complications: Secondary | ICD-10-CM | POA: Diagnosis not present

## 2019-07-15 DIAGNOSIS — I4821 Permanent atrial fibrillation: Secondary | ICD-10-CM | POA: Diagnosis not present

## 2019-07-15 DIAGNOSIS — R197 Diarrhea, unspecified: Secondary | ICD-10-CM | POA: Diagnosis not present

## 2019-07-15 DIAGNOSIS — N179 Acute kidney failure, unspecified: Secondary | ICD-10-CM | POA: Diagnosis not present

## 2019-07-15 DIAGNOSIS — E785 Hyperlipidemia, unspecified: Secondary | ICD-10-CM | POA: Diagnosis present

## 2019-07-15 DIAGNOSIS — Z79890 Hormone replacement therapy (postmenopausal): Secondary | ICD-10-CM

## 2019-07-15 DIAGNOSIS — Z7982 Long term (current) use of aspirin: Secondary | ICD-10-CM

## 2019-07-15 DIAGNOSIS — K529 Noninfective gastroenteritis and colitis, unspecified: Secondary | ICD-10-CM | POA: Diagnosis not present

## 2019-07-15 DIAGNOSIS — E860 Dehydration: Secondary | ICD-10-CM

## 2019-07-15 DIAGNOSIS — K219 Gastro-esophageal reflux disease without esophagitis: Secondary | ICD-10-CM | POA: Diagnosis not present

## 2019-07-15 DIAGNOSIS — Z20822 Contact with and (suspected) exposure to covid-19: Secondary | ICD-10-CM | POA: Diagnosis present

## 2019-07-15 DIAGNOSIS — M810 Age-related osteoporosis without current pathological fracture: Secondary | ICD-10-CM | POA: Diagnosis not present

## 2019-07-15 DIAGNOSIS — N183 Chronic kidney disease, stage 3 unspecified: Secondary | ICD-10-CM | POA: Diagnosis present

## 2019-07-15 DIAGNOSIS — Z881 Allergy status to other antibiotic agents status: Secondary | ICD-10-CM

## 2019-07-15 DIAGNOSIS — G43909 Migraine, unspecified, not intractable, without status migrainosus: Secondary | ICD-10-CM | POA: Diagnosis present

## 2019-07-15 DIAGNOSIS — G9340 Encephalopathy, unspecified: Secondary | ICD-10-CM

## 2019-07-15 DIAGNOSIS — Z882 Allergy status to sulfonamides status: Secondary | ICD-10-CM

## 2019-07-15 DIAGNOSIS — N1831 Chronic kidney disease, stage 3a: Secondary | ICD-10-CM | POA: Diagnosis present

## 2019-07-15 DIAGNOSIS — G92 Toxic encephalopathy: Secondary | ICD-10-CM | POA: Diagnosis not present

## 2019-07-15 DIAGNOSIS — I480 Paroxysmal atrial fibrillation: Secondary | ICD-10-CM | POA: Diagnosis present

## 2019-07-15 DIAGNOSIS — F3164 Bipolar disorder, current episode mixed, severe, with psychotic features: Secondary | ICD-10-CM | POA: Diagnosis not present

## 2019-07-15 DIAGNOSIS — G894 Chronic pain syndrome: Secondary | ICD-10-CM | POA: Diagnosis present

## 2019-07-15 DIAGNOSIS — Z79899 Other long term (current) drug therapy: Secondary | ICD-10-CM

## 2019-07-15 DIAGNOSIS — R52 Pain, unspecified: Secondary | ICD-10-CM | POA: Diagnosis not present

## 2019-07-15 DIAGNOSIS — E1140 Type 2 diabetes mellitus with diabetic neuropathy, unspecified: Secondary | ICD-10-CM | POA: Diagnosis present

## 2019-07-15 DIAGNOSIS — Z902 Acquired absence of lung [part of]: Secondary | ICD-10-CM

## 2019-07-15 DIAGNOSIS — Z8673 Personal history of transient ischemic attack (TIA), and cerebral infarction without residual deficits: Secondary | ICD-10-CM

## 2019-07-15 DIAGNOSIS — F419 Anxiety disorder, unspecified: Secondary | ICD-10-CM | POA: Diagnosis not present

## 2019-07-15 DIAGNOSIS — I714 Abdominal aortic aneurysm, without rupture: Secondary | ICD-10-CM | POA: Diagnosis present

## 2019-07-15 DIAGNOSIS — E1122 Type 2 diabetes mellitus with diabetic chronic kidney disease: Secondary | ICD-10-CM | POA: Diagnosis present

## 2019-07-15 DIAGNOSIS — Z823 Family history of stroke: Secondary | ICD-10-CM

## 2019-07-15 DIAGNOSIS — Z818 Family history of other mental and behavioral disorders: Secondary | ICD-10-CM

## 2019-07-15 DIAGNOSIS — D72829 Elevated white blood cell count, unspecified: Secondary | ICD-10-CM | POA: Diagnosis not present

## 2019-07-15 DIAGNOSIS — E119 Type 2 diabetes mellitus without complications: Secondary | ICD-10-CM

## 2019-07-15 DIAGNOSIS — R410 Disorientation, unspecified: Secondary | ICD-10-CM | POA: Diagnosis not present

## 2019-07-15 DIAGNOSIS — Z7901 Long term (current) use of anticoagulants: Secondary | ICD-10-CM | POA: Diagnosis not present

## 2019-07-15 DIAGNOSIS — I129 Hypertensive chronic kidney disease with stage 1 through stage 4 chronic kidney disease, or unspecified chronic kidney disease: Secondary | ICD-10-CM | POA: Diagnosis not present

## 2019-07-15 DIAGNOSIS — R9431 Abnormal electrocardiogram [ECG] [EKG]: Secondary | ICD-10-CM | POA: Diagnosis not present

## 2019-07-15 DIAGNOSIS — Z888 Allergy status to other drugs, medicaments and biological substances status: Secondary | ICD-10-CM

## 2019-07-15 DIAGNOSIS — R4182 Altered mental status, unspecified: Secondary | ICD-10-CM | POA: Diagnosis not present

## 2019-07-15 DIAGNOSIS — F3163 Bipolar disorder, current episode mixed, severe, without psychotic features: Secondary | ICD-10-CM | POA: Diagnosis not present

## 2019-07-15 DIAGNOSIS — Z885 Allergy status to narcotic agent status: Secondary | ICD-10-CM

## 2019-07-15 DIAGNOSIS — R0902 Hypoxemia: Secondary | ICD-10-CM | POA: Diagnosis not present

## 2019-07-15 DIAGNOSIS — R5381 Other malaise: Secondary | ICD-10-CM | POA: Diagnosis not present

## 2019-07-15 DIAGNOSIS — G9341 Metabolic encephalopathy: Secondary | ICD-10-CM | POA: Diagnosis present

## 2019-07-15 DIAGNOSIS — R404 Transient alteration of awareness: Secondary | ICD-10-CM | POA: Diagnosis not present

## 2019-07-15 DIAGNOSIS — Z880 Allergy status to penicillin: Secondary | ICD-10-CM

## 2019-07-15 DIAGNOSIS — Z9181 History of falling: Secondary | ICD-10-CM

## 2019-07-15 DIAGNOSIS — Z7984 Long term (current) use of oral hypoglycemic drugs: Secondary | ICD-10-CM

## 2019-07-15 DIAGNOSIS — Z8249 Family history of ischemic heart disease and other diseases of the circulatory system: Secondary | ICD-10-CM

## 2019-07-15 LAB — COMPREHENSIVE METABOLIC PANEL
ALT: 14 U/L (ref 0–44)
AST: 20 U/L (ref 15–41)
Albumin: 4.3 g/dL (ref 3.5–5.0)
Alkaline Phosphatase: 43 U/L (ref 38–126)
Anion gap: 17 — ABNORMAL HIGH (ref 5–15)
BUN: 20 mg/dL (ref 8–23)
CO2: 20 mmol/L — ABNORMAL LOW (ref 22–32)
Calcium: 9.9 mg/dL (ref 8.9–10.3)
Chloride: 105 mmol/L (ref 98–111)
Creatinine, Ser: 1.05 mg/dL — ABNORMAL HIGH (ref 0.44–1.00)
GFR calc Af Amer: >60 mL/min (ref >60)
GFR calc non Af Amer: 54 mL/min — ABNORMAL LOW (ref >60)
Glucose, Bld: 131 mg/dL — ABNORMAL HIGH (ref 70–99)
Potassium: 3.5 mmol/L (ref 3.5–5.1)
Sodium: 142 mmol/L (ref 135–145)
Total Bilirubin: 0.9 mg/dL (ref 0.3–1.2)
Total Protein: 6.9 g/dL (ref 6.5–8.1)

## 2019-07-15 LAB — RAPID URINE DRUG SCREEN, HOSP PERFORMED
Amphetamines: NOT DETECTED
Barbiturates: NOT DETECTED
Benzodiazepines: NOT DETECTED
Cocaine: NOT DETECTED
Opiates: POSITIVE — AB
Tetrahydrocannabinol: NOT DETECTED

## 2019-07-15 LAB — CBC WITH DIFFERENTIAL/PLATELET
Abs Immature Granulocytes: 0.05 10*3/uL (ref 0.00–0.07)
Basophils Absolute: 0 10*3/uL (ref 0.0–0.1)
Basophils Relative: 0 %
Eosinophils Absolute: 0 10*3/uL (ref 0.0–0.5)
Eosinophils Relative: 0 %
HCT: 39 % (ref 36.0–46.0)
Hemoglobin: 13 g/dL (ref 12.0–15.0)
Immature Granulocytes: 0 %
Lymphocytes Relative: 12 %
Lymphs Abs: 1.3 10*3/uL (ref 0.7–4.0)
MCH: 30.5 pg (ref 26.0–34.0)
MCHC: 33.3 g/dL (ref 30.0–36.0)
MCV: 91.5 fL (ref 80.0–100.0)
Monocytes Absolute: 0.9 10*3/uL (ref 0.1–1.0)
Monocytes Relative: 8 %
Neutro Abs: 9.1 10*3/uL — ABNORMAL HIGH (ref 1.7–7.7)
Neutrophils Relative %: 80 %
Platelets: 245 10*3/uL (ref 150–400)
RBC: 4.26 MIL/uL (ref 3.87–5.11)
RDW: 13.1 % (ref 11.5–15.5)
WBC: 11.3 10*3/uL — ABNORMAL HIGH (ref 4.0–10.5)
nRBC: 0 % (ref 0.0–0.2)

## 2019-07-15 LAB — AMMONIA: Ammonia: <9 umol/L — ABNORMAL LOW (ref 9–35)

## 2019-07-15 LAB — URINALYSIS, ROUTINE W REFLEX MICROSCOPIC
Bacteria, UA: NONE SEEN
Bilirubin Urine: NEGATIVE
Glucose, UA: NEGATIVE mg/dL
Hgb urine dipstick: NEGATIVE
Ketones, ur: 20 mg/dL — AB
Leukocytes,Ua: NEGATIVE
Nitrite: NEGATIVE
Protein, ur: 30 mg/dL — AB
Specific Gravity, Urine: 1.026 (ref 1.005–1.030)
pH: 6 (ref 5.0–8.0)

## 2019-07-15 LAB — CBG MONITORING, ED
Glucose-Capillary: 113 mg/dL — ABNORMAL HIGH (ref 70–99)
Glucose-Capillary: 100 mg/dL — ABNORMAL HIGH (ref 70–99)

## 2019-07-15 LAB — C DIFFICILE QUICK SCREEN W PCR REFLEX
C Diff antigen: POSITIVE — AB
C Diff toxin: NEGATIVE

## 2019-07-15 LAB — TSH: TSH: 0.341 u[IU]/mL — ABNORMAL LOW (ref 0.350–4.500)

## 2019-07-15 LAB — ACETAMINOPHEN LEVEL: Acetaminophen (Tylenol), Serum: <10 ug/mL — ABNORMAL LOW (ref 10–30)

## 2019-07-15 LAB — PROTIME-INR
INR: 1.3 — ABNORMAL HIGH (ref 0.8–1.2)
Prothrombin Time: 15.5 seconds — ABNORMAL HIGH (ref 11.4–15.2)

## 2019-07-15 LAB — LITHIUM LEVEL: Lithium Lvl: 0.22 mmol/L — ABNORMAL LOW (ref 0.60–1.20)

## 2019-07-15 LAB — ETHANOL: Alcohol, Ethyl (B): <10 mg/dL (ref <10)

## 2019-07-15 LAB — TROPONIN I (HIGH SENSITIVITY)
Troponin I (High Sensitivity): 8 ng/L (ref <18)
Troponin I (High Sensitivity): 10 ng/L (ref <18)

## 2019-07-15 LAB — SARS CORONAVIRUS 2 BY RT PCR (HOSPITAL ORDER, PERFORMED IN ~~LOC~~ HOSPITAL LAB): SARS Coronavirus 2: NEGATIVE

## 2019-07-15 LAB — MAGNESIUM: Magnesium: 1.9 mg/dL (ref 1.7–2.4)

## 2019-07-15 LAB — CLOSTRIDIUM DIFFICILE BY PCR, REFLEXED: Toxigenic C. Difficile by PCR: NEGATIVE

## 2019-07-15 LAB — LACTIC ACID, PLASMA: Lactic Acid, Venous: 1.7 mmol/L (ref 0.5–1.9)

## 2019-07-15 LAB — SALICYLATE LEVEL: Salicylate Lvl: <7 mg/dL — ABNORMAL LOW (ref 7.0–30.0)

## 2019-07-15 LAB — PHOSPHORUS: Phosphorus: 2.4 mg/dL — ABNORMAL LOW (ref 2.5–4.6)

## 2019-07-15 MED ORDER — MELATONIN 5 MG PO TABS
15.0000 mg | ORAL_TABLET | Freq: Every day | ORAL | Status: DC
  Filled 2019-07-15: qty 3

## 2019-07-15 MED ORDER — INSULIN ASPART 100 UNIT/ML ~~LOC~~ SOLN
0.0000 [IU] | Freq: Three times a day (TID) | SUBCUTANEOUS | Status: DC
  Administered 2019-07-17: 1 [IU] via SUBCUTANEOUS

## 2019-07-15 MED ORDER — ACETAMINOPHEN 325 MG PO TABS: 650.0000 mg | ORAL_TABLET | Freq: Four times a day (QID) | ORAL | Status: DC | PRN

## 2019-07-15 MED ORDER — SODIUM CHLORIDE 0.9% FLUSH
3.0000 mL | Freq: Two times a day (BID) | INTRAVENOUS | Status: DC
  Administered 2019-07-18 – 2019-07-20 (×5): 3 mL via INTRAVENOUS

## 2019-07-15 MED ORDER — BUSPIRONE HCL 5 MG PO TABS: 30.0000 mg | ORAL_TABLET | Freq: Two times a day (BID) | ORAL | Status: DC

## 2019-07-15 MED ORDER — METRONIDAZOLE IN NACL 5-0.79 MG/ML-% IV SOLN
500.0000 mg | Freq: Three times a day (TID) | INTRAVENOUS | Status: DC
  Administered 2019-07-15 – 2019-07-17 (×6): 500 mg via INTRAVENOUS
  Filled 2019-07-15 (×6): qty 100

## 2019-07-15 MED ORDER — PANTOPRAZOLE SODIUM 40 MG PO TBEC: 40.0000 mg | DELAYED_RELEASE_TABLET | Freq: Every day | ORAL | Status: DC

## 2019-07-15 MED ORDER — LITHIUM CARBONATE 150 MG PO CAPS
150.0000 mg | ORAL_CAPSULE | Freq: Every day | ORAL | Status: DC
  Administered 2019-07-18: 150 mg via ORAL
  Filled 2019-07-15 (×3): qty 1

## 2019-07-15 MED ORDER — LORATADINE 10 MG PO TABS
10.0000 mg | ORAL_TABLET | Freq: Every evening | ORAL | Status: DC
  Administered 2019-07-18 – 2019-07-20 (×3): 10 mg via ORAL
  Filled 2019-07-15 (×2): qty 1

## 2019-07-15 MED ORDER — QUETIAPINE FUMARATE ER 300 MG PO TB24
300.0000 mg | ORAL_TABLET | Freq: Every day | ORAL | Status: DC
  Filled 2019-07-15: qty 1

## 2019-07-15 MED ORDER — WARFARIN SODIUM 5 MG PO TABS: 5.0000 mg | ORAL_TABLET | Freq: Once | ORAL | Status: DC

## 2019-07-15 MED ORDER — ACETAMINOPHEN 650 MG RE SUPP: 650.0000 mg | Freq: Four times a day (QID) | RECTAL | Status: DC | PRN

## 2019-07-15 MED ORDER — SODIUM CHLORIDE 0.9 % IV BOLUS (SEPSIS)
1000.0000 mL | Freq: Once | INTRAVENOUS | Status: AC
  Administered 2019-07-15: 1000 mL via INTRAVENOUS

## 2019-07-15 MED ORDER — VERAPAMIL HCL ER 120 MG PO TBCR
120.0000 mg | EXTENDED_RELEASE_TABLET | Freq: Every day | ORAL | Status: DC
  Administered 2019-07-18 – 2019-07-19 (×3): 120 mg via ORAL
  Filled 2019-07-15 (×6): qty 1

## 2019-07-15 MED ORDER — CIPROFLOXACIN IN D5W 400 MG/200ML IV SOLN
400.0000 mg | Freq: Two times a day (BID) | INTRAVENOUS | Status: DC
  Administered 2019-07-15 – 2019-07-16 (×2): 400 mg via INTRAVENOUS
  Filled 2019-07-15 (×2): qty 200

## 2019-07-15 MED ORDER — ONDANSETRON HCL 4 MG PO TABS: 4.0000 mg | ORAL_TABLET | Freq: Four times a day (QID) | ORAL | Status: DC | PRN

## 2019-07-15 MED ORDER — LITHIUM CARBONATE 300 MG PO CAPS
300.0000 mg | ORAL_CAPSULE | Freq: Every day | ORAL | Status: DC
  Administered 2019-07-18: 300 mg via ORAL
  Filled 2019-07-15 (×3): qty 1

## 2019-07-15 MED ORDER — SENNOSIDES-DOCUSATE SODIUM 8.6-50 MG PO TABS
2.0000 | ORAL_TABLET | Freq: Two times a day (BID) | ORAL | Status: DC
  Filled 2019-07-15 (×2): qty 2

## 2019-07-15 MED ORDER — SODIUM CHLORIDE 0.9 % IV SOLN: 1000.0000 mL | INTRAVENOUS | Status: DC

## 2019-07-15 MED ORDER — WARFARIN SODIUM 2.5 MG PO TABS
2.5000 mg | ORAL_TABLET | Freq: Once | ORAL | Status: DC
  Filled 2019-07-15: qty 1

## 2019-07-15 MED ORDER — PREGABALIN 50 MG PO CAPS: 50.0000 mg | ORAL_CAPSULE | Freq: Three times a day (TID) | ORAL | Status: DC

## 2019-07-15 MED ORDER — LAMOTRIGINE 100 MG PO TABS
100.0000 mg | ORAL_TABLET | Freq: Two times a day (BID) | ORAL | Status: DC
  Filled 2019-07-15 (×2): qty 1

## 2019-07-15 MED ORDER — ONDANSETRON HCL 4 MG/2ML IJ SOLN
4.0000 mg | Freq: Four times a day (QID) | INTRAMUSCULAR | Status: DC | PRN
  Administered 2019-07-16: 4 mg via INTRAVENOUS
  Filled 2019-07-15: qty 2

## 2019-07-15 MED ORDER — ALBUTEROL SULFATE (2.5 MG/3ML) 0.083% IN NEBU: 2.5000 mg | INHALATION_SOLUTION | Freq: Four times a day (QID) | RESPIRATORY_TRACT | Status: DC | PRN

## 2019-07-15 MED ORDER — WARFARIN - PHARMACIST DOSING INPATIENT: Freq: Every day | Status: DC

## 2019-07-15 MED ORDER — PRAVASTATIN SODIUM 10 MG PO TABS: 20.0000 mg | ORAL_TABLET | Freq: Every day | ORAL | Status: DC

## 2019-07-15 MED ORDER — HYDRALAZINE HCL 20 MG/ML IJ SOLN: 10.0000 mg | INTRAMUSCULAR | Status: DC | PRN

## 2019-07-15 MED ORDER — SODIUM CHLORIDE 0.9 % IV SOLN: INTRAVENOUS | Status: DC

## 2019-07-15 MED ORDER — METOPROLOL SUCCINATE ER 50 MG PO TB24
50.0000 mg | ORAL_TABLET | Freq: Every day | ORAL | Status: DC
  Administered 2019-07-18 – 2019-07-20 (×3): 50 mg via ORAL
  Filled 2019-07-15 (×4): qty 1

## 2019-07-15 MED ORDER — LEVOTHYROXINE SODIUM 100 MCG PO TABS
100.0000 ug | ORAL_TABLET | Freq: Every day | ORAL | Status: DC
  Administered 2019-07-18 – 2019-07-20 (×3): 100 ug via ORAL
  Filled 2019-07-15 (×5): qty 1

## 2019-07-15 MED ORDER — VANCOMYCIN 50 MG/ML ORAL SOLUTION
125.0000 mg | Freq: Four times a day (QID) | ORAL | Status: DC
  Filled 2019-07-15 (×3): qty 2.5

## 2019-07-15 NOTE — ED Provider Notes (Signed)
[facility] EMERGENCY DEPARTMENT Provider Note   CSN: [id number] Arrival date & time: 07/15/19  1111     History Chief Complaint  Patient presents with  . Altered Mental Status    Meredith [name] is a 69 y.o. female.  HPI At baseline, patient is independent.  She lives alone and takes care of her own ADLs.  She drives a car.  Her brother last saw her 6 days ago.  At that time she was at baseline.  She missed 2 follow-up medical appointments this week and her provider contact her brother to check on her.  The patient had not returned phone calls.  EMS was called and the patient was found disoriented and only saying a few words repeatedly.  She was covered in stool.  Patient does have a history of psychiatric illness.  She has history of bipolar with some episodes of psychotic feature.  Also significant medical history with paroxysmal atrial fibrillation anticoagulated on Coumadin.  Prior history of episode of lithium toxicity.  Patient cannot answer any specific questions.  Intermittently she will say "okay", "yeah" and make affirmative humming sounds.  No meaningful history.    Past Medical History:  Diagnosis Date  . Anxiety   . Atrial fibrillation ([place])   . Bipolar 2 disorder ([place])   . Depression   . History of cardioversion    x2  . Hyperlipidemia   . Hypothyroidism   . Migraine headache   . Osteoporosis     Patient Active Problem List   Diagnosis Date Noted  . Lithium toxicity 10/27/2018  . Acute encephalopathy   . Acute ischemic stroke ([place]) 10/22/2018  . Renal insufficiency 10/22/2018  . Pain and swelling of right lower leg 10/22/2018  . Hypothyroidism 02/24/2018  . Encounter for therapeutic drug monitoring 01/03/2018  . PTSD (post-traumatic stress disorder) 11/29/2017  . Falls 11/25/2017  . Disorientation   . Delirium 11/24/2017  . Stiffness of finger joint of right hand 06/18/2017  . Osteoarthritis of carpometacarpal (CMC) joint of thumb  05/21/2017  . Anxiety 11/29/2016  . Chronic pain syndrome 07/30/2016  . Bipolar disorder, curr episode mixed, severe, with psychotic features ([place]) 07/28/2016  . PAF (paroxysmal atrial fibrillation) ([place]) 11/14/2015  . Intractable chronic migraine without aura 04/24/2012    Past Surgical History:  Procedure Laterality Date  . ABLATION    . BACK SURGERY  2014  . CARDIOVERSION  12/2013, 01/2014   x2  . KNEE SURGERY     ORTHOSCOPIC  . LUNG REMOVAL, PARTIAL Right    BENIGN LUNG CYST  . THORACIC SPINE SURGERY Right   . THUMB ARTHROSCOPY       OB History   No obstetric history on file.     Family History  Problem Relation Age of Onset  . Arthritis Mother   . Depression Mother   . Diabetes Mother   . Heart disease Mother   . Stroke Mother   . Early death Father   . Heart disease Father   . Atrial fibrillation Brother   . Hyperlipidemia Brother   . Multiple sclerosis Sister   . Alcohol abuse Brother   . Asthma Brother   . Drug abuse Brother   . Hypertension Brother   . Mental illness Brother     Social History   Tobacco Use  . Smoking status: Never Smoker  . Smokeless tobacco: Never Used  Substance Use Topics  . Alcohol use: No  . Drug use: No  Home Medications Prior to Admission medications   Medication Sig Start Date End Date Taking? Authorizing Provider  aspirin 325 MG tablet Take 325 mg by mouth daily as needed for headache.    Yes [provider]  baclofen (LIORESAL) 10 MG tablet Take 10 mg by mouth 2 (two) times daily. 05/26/19  Yes [provider]  calcium carbonate (OS-CAL - DOSED IN MG OF ELEMENTAL CALCIUM) 1250 (500 Ca) MG tablet Take 1 tablet by mouth daily with breakfast.   Yes [provider]  Cholecalciferol (VITAMIN D3) 1000 units CAPS Take 5,000 Units by mouth daily.    Yes [provider]  diphenhydrAMINE (BENADRYL ALLERGY) 25 mg capsule Take 50 mg by mouth in the morning and at bedtime.    Yes [provider]  Erenumab-aooe (AIMOVIG) 70 MG/ML SOAJ Inject 70 mg into the skin every 30 (thirty) days.   Yes [provider]  estradiol (ESTRACE) 1 MG tablet Take 1 mg by mouth daily. 01/27/19  Yes [provider]  ferrous sulfate 325 (65 FE) MG tablet Take 325 mg by mouth 2 (two) times daily.   Yes [provider]  lamoTRIgine (LAMICTAL) 100 MG tablet Take 1 tablet by mouth twice daily 04/23/19  Yes Cottle, Billey Co., MD  lithium carbonate 150 MG capsule Take 1 capsule (150 mg total) by mouth daily. 05/12/19  Yes Cottle, Billey Co., MD  lithium carbonate 300 MG capsule Take 1 capsule (300 mg total) by mouth at bedtime. 01/15/19  Yes Cottle, Billey Co., MD  Melatonin 5 MG TABS Take 15 mg by mouth at bedtime.   Yes [provider]  metFORMIN (GLUCOPHAGE) 500 MG tablet Take 1 tablet (500 mg total) by mouth 2 (two) times daily with a meal. 01/27/19  Yes Cottle, Billey Co., MD  metoprolol succinate (TOPROL-XL) 50 MG 24 hr tablet TAKE 1 TABLET BY MOUTH ONCE DAILY WITH  OR  IMMEDIATELY  FOLLOWING  A  MEAL Patient taking differently: Take 50 mg by mouth daily.  02/24/19  Yes [name], Wonda Cheng, MD  pantoprazole (PROTONIX) 40 MG tablet Take 1 tablet (40 mg total) by mouth daily. 07/07/18  Yes Martinique, Betty G, MD  pregabalin (LYRICA) 50 MG capsule Take 50 mg by mouth 3 (three) times daily.   Yes [provider]  QUEtiapine Fumarate (SEROQUEL XR) 300 MG 24 hr tablet Take 1 tablet (300 mg total) by mouth at bedtime. 06/23/19  Yes Cottle, Billey Co., MD  senna-docusate (PERI-COLACE) 8.6-50 MG tablet Take 2 tablets by mouth 2 (two) times daily. For constipation 07/31/16  Yes Nwoko, Herbert Pun I, NP  verapamil (VERELAN PM) 120 MG 24 hr capsule Take 1 capsule (120 mg total) by mouth at bedtime. For high blood pressure 07/31/16  Yes Nwoko, Agnes I, NP  warfarin (COUMADIN) 2.5 MG tablet TAKE AS DIRECTED BY COUMADIN CLINIC Patient taking differently: Take 2.5 mg by mouth See admin  instructions. Take as directed by Coumadin Clinic 06/08/19  Yes [name], Wonda Cheng, MD  Ascorbic Acid (VITAMIN C) 1000 MG tablet Take 1,000 mg by mouth daily.    [provider]  busPIRone (BUSPAR) 15 MG tablet Take 2 tablets (30 mg total) by mouth 2 (two) times daily. 06/23/19   Cottle, Billey Co., MD  clotrimazole (LOTRIMIN) 1 % cream Apply 1 application topically 2 (two) times daily as needed (skin care).  04/01/18   [provider]  levocetirizine (XYZAL) 5 MG tablet Take 5 mg  by mouth every evening. 07/07/19   [provider]  levothyroxine (SYNTHROID, LEVOTHROID) 100 MCG tablet Take 1 tablet (100 mcg total) by mouth daily before breakfast. 05/06/18   Martinique, Betty G, MD  ondansetron (ZOFRAN) 4 MG tablet Take 1 tablet (4 mg total) by mouth 3 (three) times daily as needed. As needed for nausea 07/31/16   Lindell Spar I, NP  pravastatin (PRAVACHOL) 20 MG tablet Take 20 mg by mouth daily. 10/22/17   [provider]    Allergies    Codeine, Adhesive [tape], Celebrex [celecoxib], Sulfa antibiotics, Tricyclic antidepressants, Amoxicillin, Ampicillin, Cephalexin, and Penicillins  Review of Systems   Review of Systems Level 5 caveat cannot obtain review of systems due to patient condition. Physical Exam Updated Vital Signs BP (!) 153/68 (BP Location: Right Arm)   Pulse 66   Temp 98.3 F (36.8 C) (Axillary)   Resp 16   LMP  (LMP Unknown)   SpO2 100%   Physical Exam Constitutional:      Comments: Patient is awake.  She is alert in appearance.  She does have eye contact.  No respiratory distress.  HENT:     Mouth/Throat:     Mouth: Mucous membranes are dry.  Eyes:     Extraocular Movements: Extraocular movements intact.     Conjunctiva/sclera: Conjunctivae normal.     Pupils: Pupils are equal, round, and reactive to light.  Cardiovascular:     Rate and Rhythm: Normal rate and regular rhythm.  Pulmonary:     Effort: Pulmonary effort is normal.     Breath  sounds: Normal breath sounds.  Abdominal:     General: There is no distension.     Palpations: Abdomen is soft.     Tenderness: There is no abdominal tenderness. There is no guarding.  Musculoskeletal:        General: No swelling or tenderness. Normal range of motion.     Cervical back: Neck supple.     Right lower leg: No edema.     Left lower leg: No edema.  Skin:    General: Skin is warm and dry.  Neurological:     Comments: Patient is awake and alert.  She only makes one-word responses that include okay, yeah or a humming sound.  No other verbal responses.  She is gazing about.  Extraocular motions are conjugate in appearance.  She will spontaneously move both arms and hands to readjust blankets or move her self.  She is not following commands for grip strength.  She can move both lower extremities at will.  She does withdraw them briskly to stroking of the sole of the foot.  No focal motor deficits.     ED Results / Procedures / Treatments   Labs (all labs ordered are listed, but only abnormal results are displayed) Labs Reviewed  C DIFFICILE QUICK SCREEN W PCR REFLEX - Abnormal; Notable for the following components:      Result Value   C Diff antigen POSITIVE (*)    All other components within normal limits  COMPREHENSIVE METABOLIC PANEL - Abnormal; Notable for the following components:   CO2 20 (*)    Glucose, Bld 131 (*)    Creatinine, Ser 1.05 (*)    GFR calc non Af Amer 54 (*)    Anion gap 17 (*)    All other components within normal limits  SALICYLATE LEVEL - Abnormal; Notable for the following components:   Salicylate Lvl [id number] (*)    All  other components within normal limits  ACETAMINOPHEN LEVEL - Abnormal; Notable for the following components:   Acetaminophen (Tylenol), Serum <10 (*)    All other components within normal limits  CBC WITH DIFFERENTIAL/PLATELET - Abnormal; Notable for the following components:   WBC 11.3 (*)    Neutro Abs 9.1 (*)    All other  components within normal limits  PROTIME-INR - Abnormal; Notable for the following components:   Prothrombin Time 15.5 (*)    INR 1.3 (*)    All other components within normal limits  URINALYSIS, ROUTINE W REFLEX MICROSCOPIC - Abnormal; Notable for the following components:   Ketones, ur 20 (*)    Protein, ur 30 (*)    All other components within normal limits  RAPID URINE DRUG SCREEN, HOSP PERFORMED - Abnormal; Notable for the following components:   Opiates POSITIVE (*)    All other components within normal limits  PHOSPHORUS - Abnormal; Notable for the following components:   Phosphorus 2.4 (*)    All other components within normal limits  TSH - Abnormal; Notable for the following components:   TSH 0.341 (*)    All other components within normal limits  AMMONIA - Abnormal; Notable for the following components:   Ammonia <9 (*)    All other components within normal limits  LITHIUM LEVEL - Abnormal; Notable for the following components:   Lithium Lvl 0.22 (*)    All other components within normal limits  CBG MONITORING, ED - Abnormal; Notable for the following components:   Glucose-Capillary 113 (*)    All other components within normal limits  SARS CORONAVIRUS 2 BY RT PCR (HOSPITAL ORDER, [facility])  GASTROINTESTINAL PANEL BY PCR, STOOL (REPLACES STOOL CULTURE)  CLOSTRIDIUM DIFFICILE BY PCR, REFLEXED  ETHANOL  LACTIC ACID, PLASMA  MAGNESIUM  TROPONIN I (HIGH SENSITIVITY)  TROPONIN I (HIGH SENSITIVITY)    EKG EKG Interpretation  Date/Time:  Wednesday Jul 15 2019 11:15:58 EDT Ventricular Rate:  71 PR Interval:    QRS Duration: 85 QT Interval:  425 QTC Calculation: 462 R Axis:   42 Text Interpretation: Sinus rhythm normal, no change from previous Confirmed by Charlesetta Shanks [phone]) on 07/15/2019 2:50:54 PM   Radiology CT Head Wo Contrast  Result Date: 07/15/2019 CLINICAL DATA:  Altered mental status. EXAM: CT HEAD WITHOUT CONTRAST  TECHNIQUE: Contiguous axial images were obtained from the base of the skull through the vertex without intravenous contrast. COMPARISON:  10/22/2018 FINDINGS: Brain: No sign of acute infarction, mass lesion, hemorrhage, hydrocephalus or extra-axial collection. Minimal small vessel change of the hemispheric deep white matter. Vascular: No abnormal vascular finding. Skull: Normal Sinuses/Orbits: Clear/normal Other: None IMPRESSION: No acute or significant CT finding. Minimal small vessel change of the hemispheric white matter. Electronically Signed   By: Nelson Chimes M.D.   On: 07/15/2019 13:10   DG Chest Port 1 View  Result Date: 07/15/2019 CLINICAL DATA:  Altered mental status. Confusion. EXAM: PORTABLE CHEST 1 VIEW COMPARISON:  10/22/2018 FINDINGS: Heart size is stable and at the upper limits of normal. Elevation of right hemidiaphragm is unchanged. Mild scarring seen in left lung base. Both lungs are otherwise clear. Posterior thoracic spine fusion hardware again noted. IMPRESSION: Stable exam. No active disease. Electronically Signed   By: Marlaine Hind M.D.   On: 07/15/2019 12:11    Procedures Procedures (including critical care time) CRITICAL CARE Performed by: Charlesetta Shanks   Total critical care time: 30 minutes  Critical care time  was exclusive of separately billable procedures and treating other patients.  Critical care was necessary to treat or prevent imminent or life-threatening deterioration.  Critical care was time spent personally by me on the following activities: development of treatment plan with patient and/or surrogate as well as nursing, discussions with consultants, evaluation of patient's response to treatment, examination of patient, obtaining history from patient or surrogate, ordering and performing treatments and interventions, ordering and review of laboratory studies, ordering and review of radiographic studies, pulse oximetry and re-evaluation of patient's  condition. Medications Ordered in ED Medications  sodium chloride 0.9 % bolus 1,000 mL (has no administration in time range)    Followed by  0.9 %  sodium chloride infusion (has no administration in time range)    ED Course  I have reviewed the triage vital signs and the nursing notes.  Pertinent labs & imaging results that were available during my care of the patient were reviewed by me and considered in my medical decision making (see chart for details).    MDM Rules/Calculators/A&P                     Patient presents as outlined.  She is confused and encephalopathic.  She does not have focal motor deficits.  CT scan does not show any intracranial bleed or injury.  She was last seen well approximately 4 days ago.  Her appearance is more suggestive of metabolic encephalopathy than stroke.  Patient did have MRI with a similar presentation about a year ago.  Will reorder MRI to rule out other pathology.  Patient does have diarrhea.  Unknown how long.  She cannot give additional history at this time.  There are ketones in the urine.  C. difficile testing is positive.  Will initiate rehydration and plan for admission.  Final Clinical Impression(s) / ED Diagnoses Final diagnoses:  Encephalopathy acute  Diarrhea, unspecified type  Dehydration  C. difficile diarrhea    Rx / DC Orders ED Discharge Orders    None       Charlesetta Shanks, MD 07/15/19 1455

## 2019-07-15 NOTE — Progress Notes (Signed)
Patient was not able to tolerate oral vancomycin. Switched to IV ciprofloxacin and metronidazole.

## 2019-07-15 NOTE — Telephone Encounter (Signed)
Pt husband left message that Meredith [name] was acting incoherent this morning like she did not know what day it was, or what was going on. Since then she has been admitted to the ER.

## 2019-07-15 NOTE — H&P (Addendum)
History and Physical    Meredith [name] [id number] DOB: 1950/03/06 DOA: 07/15/2019  Referring MD/NP/PA: Tomasa Rand, MD PCP: Kristen Loader, FNP  Patient coming from: Home via EMS  Chief Complaint: Altered mental status  I have personally briefly reviewed patient's old medical records in [place]   HPI: Meredith [name] is a 69 y.o. female with medical history significant of bipolar disorder, atrial fibrillation on Coumadin, AAA, hyperlipidemia, hypothyroidism, and chronic kidney disease stage III presents after being found to be acutely altered.  She was last noted to be normal 6 days ago.  History is obtained from her brother who is present at bedside.  At baseline patient is able to communicate and complete all of her ADLs without assistance living alone.  However, he recalls receiving a weird text that did not make any sense 2 days ago.  After the patient had missed 2 medical appointments this week her provider contacted her brother who called EMS.  She was found covered in stool.  To his knowledge she had not recently taking any antibiotics or had any changes in her medicines.  Records note she has had previous episodes of being altered previously related with lithium toxicity.  Patient says "okay" and "yeah", but does not contribute any specific history.  ED Course: Upon admission into the emergency department patient was seen to be afebrile with blood pressures elevated up to 183/79, and all other vital signs maintained.  Labs significant for WBC 11.2, BUN 20, creatinine 1.05, lithium level 0.22, and anion gap 17.  CT scan of the brain showed no acute abnormalities.  Chest x-ray was noted to show no acute abnormalities. Urinalysis was negative for any signs of infection.  Patient was started on oral vancomycin and given 1 L normal saline IV fluids.    UDS positive for opiates.  TRH called to admit.   Review of Systems  Unable to perform ROS: Mental status change    Past  Medical History:  Diagnosis Date  . Anxiety   . Atrial fibrillation ([place])   . Bipolar 2 disorder ([place])   . Depression   . History of cardioversion    x2  . Hyperlipidemia   . Hypothyroidism   . Migraine headache   . Osteoporosis     Past Surgical History:  Procedure Laterality Date  . ABLATION    . BACK SURGERY  2014  . CARDIOVERSION  12/2013, 01/2014   x2  . KNEE SURGERY     ORTHOSCOPIC  . LUNG REMOVAL, PARTIAL Right    BENIGN LUNG CYST  . THORACIC SPINE SURGERY Right   . THUMB ARTHROSCOPY       reports that she has never smoked. She has never used smokeless tobacco. She reports that she does not drink alcohol or use drugs.  Allergies  Allergen Reactions  . Codeine Anaphylaxis    Patient states she can take codeine but not percocet   . Adhesive [Tape]     blister  . Celebrex [Celecoxib] Hives  . Sulfa Antibiotics Hives  . Tricyclic Antidepressants     Doesn't help  . Amoxicillin Rash  . Ampicillin Rash  . Cephalexin Rash  . Penicillins Rash    Did it involve swelling of the face/tongue/throat, SOB, or low BP? Yes Did it involve sudden or severe rash/hives, skin peeling, or any reaction on the inside of your mouth or nose? NO Did you need to seek medical attention at a hospital or doctor's office? NO  When did it last happen? childhood If all above answers are "NO", may proceed with cephalosporin use.    Family History  Problem Relation Age of Onset  . Arthritis Mother   . Depression Mother   . Diabetes Mother   . Heart disease Mother   . Stroke Mother   . Early death Father   . Heart disease Father   . Atrial fibrillation Brother   . Hyperlipidemia Brother   . Multiple sclerosis Sister   . Alcohol abuse Brother   . Asthma Brother   . Drug abuse Brother   . Hypertension Brother   . Mental illness Brother     Prior to Admission medications   Medication Sig Start Date End Date Taking? Authorizing Provider  aspirin 325 MG tablet Take 325 mg  by mouth daily as needed for headache.    Yes [provider]  baclofen (LIORESAL) 10 MG tablet Take 10 mg by mouth 2 (two) times daily. 05/26/19  Yes [provider]  calcium carbonate (OS-CAL - DOSED IN MG OF ELEMENTAL CALCIUM) 1250 (500 Ca) MG tablet Take 1 tablet by mouth daily with breakfast.   Yes [provider]  Cholecalciferol (VITAMIN D3) 1000 units CAPS Take 5,000 Units by mouth daily.    Yes [provider]  diphenhydrAMINE (BENADRYL ALLERGY) 25 mg capsule Take 50 mg by mouth in the morning and at bedtime.    Yes [provider]  Erenumab-aooe (AIMOVIG) 70 MG/ML SOAJ Inject 70 mg into the skin every 30 (thirty) days.   Yes [provider]  estradiol (ESTRACE) 1 MG tablet Take 1 mg by mouth daily. 01/27/19  Yes [provider]  ferrous sulfate 325 (65 FE) MG tablet Take 325 mg by mouth 2 (two) times daily.   Yes [provider]  lamoTRIgine (LAMICTAL) 100 MG tablet Take 1 tablet by mouth twice daily 04/23/19  Yes Cottle, Billey Co., MD  lithium carbonate 150 MG capsule Take 1 capsule (150 mg total) by mouth daily. 05/12/19  Yes Cottle, Billey Co., MD  lithium carbonate 300 MG capsule Take 1 capsule (300 mg total) by mouth at bedtime. 01/15/19  Yes Cottle, Billey Co., MD  Melatonin 5 MG TABS Take 15 mg by mouth at bedtime.   Yes [provider]  metFORMIN (GLUCOPHAGE) 500 MG tablet Take 1 tablet (500 mg total) by mouth 2 (two) times daily with a meal. 01/27/19  Yes Cottle, Billey Co., MD  metoprolol succinate (TOPROL-XL) 50 MG 24 hr tablet TAKE 1 TABLET BY MOUTH ONCE DAILY WITH  OR  IMMEDIATELY  FOLLOWING  A  MEAL Patient taking differently: Take 50 mg by mouth daily.  02/24/19  Yes [name], Wonda Cheng, MD  pantoprazole (PROTONIX) 40 MG tablet Take 1 tablet (40 mg total) by mouth daily. 07/07/18  Yes Martinique, Betty G, MD  pregabalin (LYRICA) 50 MG capsule Take 50 mg by mouth 3 (three) times daily.   Yes [provider]  QUEtiapine Fumarate (SEROQUEL XR) 300 MG 24 hr tablet Take 1 tablet (300 mg total) by mouth at bedtime. 06/23/19  Yes Cottle, Billey Co., MD  senna-docusate (PERI-COLACE) 8.6-50 MG tablet Take 2 tablets by mouth 2 (two) times daily. For constipation 07/31/16  Yes Nwoko, Herbert Pun I, NP  verapamil (VERELAN PM) 120 MG 24 hr capsule Take 1 capsule (120 mg total) by mouth at bedtime. For high blood pressure 07/31/16  Yes Lindell Spar I, NP  warfarin (COUMADIN) 2.5  MG tablet TAKE AS DIRECTED BY COUMADIN CLINIC Patient taking differently: Take 2.5 mg by mouth See admin instructions. Take as directed by Coumadin Clinic 06/08/19  Yes [name], Wonda Cheng, MD  Ascorbic Acid (VITAMIN C) 1000 MG tablet Take 1,000 mg by mouth daily.    [provider]  busPIRone (BUSPAR) 15 MG tablet Take 2 tablets (30 mg total) by mouth 2 (two) times daily. 06/23/19   Cottle, Billey Co., MD  clotrimazole (LOTRIMIN) 1 % cream Apply 1 application topically 2 (two) times daily as needed (skin care).  04/01/18   [provider]  levocetirizine (XYZAL) 5 MG tablet Take 5 mg by mouth every evening. 07/07/19   [provider]  levothyroxine (SYNTHROID, LEVOTHROID) 100 MCG tablet Take 1 tablet (100 mcg total) by mouth daily before breakfast. 05/06/18   [place]ique, Betty G, MD  ondansetron (ZOFRAN) 4 MG tablet Take 1 tablet (4 mg total) by mouth 3 (three) times daily as needed. As needed for nausea 07/31/16   Lindell Spar I, NP  pravastatin (PRAVACHOL) 20 MG tablet Take 20 mg by mouth daily. 10/22/17   [provider]    Physical Exam:  Constitutional: Elderly female who appears to be in no acute distress and appears to follow some simple commands with repeated attempts. Vitals:   07/15/19 1345 07/15/19 1441 07/15/19 1442 07/15/19 1753  BP: (!) 152/65  (!) 153/68 (!) 167/71  Pulse: 68 (P) 89 66 72  Resp: 15 (P) 15 16 19   Temp:      TempSrc:      SpO2: 100%  100% 100%   Eyes: PERRL, lids and  conjunctivae normal ENMT: Mucous membranes are dry. Posterior pharynx clear of any exudate or lesions.  Neck: normal, supple, no masses, no thyromegaly Respiratory: clear to auscultation bilaterally, no wheezing, no crackles. Normal respiratory effort. No accessory muscle use.  Cardiovascular: Regular rate and rhythm, no murmurs / rubs / gallops. No extremity edema. 2+ pedal pulses. No carotid bruits.  Abdomen: Mild tenderness to palpation appreciated, no masses palpated. No hepatosplenomegaly. Bowel sounds positive.  Musculoskeletal: no clubbing / cyanosis. No joint deformity upper and lower extremities. Good ROM, no contractures. Normal muscle tone.  Skin: no rashes, lesions, ulcers. No induration Neurologic: CN 2-12 grossly intact.  Able to move all extremities.[name]  Psychiatric: Alert, but only says a few words repetitively.   Labs on Admission: I have personally reviewed following labs and imaging studies  CBC: Recent Labs  Lab 07/15/19 1150  WBC 11.3*  NEUTROABS 9.1*  HGB 13.0  HCT 39.0  MCV 91.5  PLT 99991111   Basic Metabolic Panel: Recent Labs  Lab 07/15/19 1150  NA 142  K 3.5  CL 105  CO2 20*  GLUCOSE 131*  BUN 20  CREATININE 1.05*  CALCIUM 9.9  MG 1.9  PHOS 2.4*   GFR: CrCl cannot be calculated (Unknown ideal weight.). Liver Function Tests: Recent Labs  Lab 07/15/19 1150  AST 20  ALT 14  ALKPHOS 43  BILITOT 0.9  PROT 6.9  ALBUMIN 4.3   No results for input(s): LIPASE, AMYLASE in the last 168 hours. Recent Labs  Lab 07/15/19 1316  AMMONIA <9*   Coagulation Profile: Recent Labs  Lab 07/15/19 1150  INR 1.3*   Cardiac Enzymes: No results for input(s): CKTOTAL, CKMB, CKMBINDEX, TROPONINI in the last 168 hours. BNP (last 3 results) No results for input(s): PROBNP in the last 8760 hours. HbA1C: No results for input(s): HGBA1C in the last 72  hours. CBG: Recent Labs  Lab 07/15/19 1116  GLUCAP 113*   Lipid Profile: No results for input(s): CHOL,  HDL, LDLCALC, TRIG, CHOLHDL, LDLDIRECT in the last 72 hours. Thyroid Function Tests: Recent Labs    07/15/19 1316  TSH 0.341*   Anemia Panel: No results for input(s): VITAMINB12, FOLATE, FERRITIN, TIBC, IRON, RETICCTPCT in the last 72 hours. Urine analysis:    Component Value Date/Time   COLORURINE YELLOW 07/15/2019 1222   APPEARANCEUR CLEAR 07/15/2019 1222   LABSPEC 1.026 07/15/2019 1222   PHURINE 6.0 07/15/2019 1222   GLUCOSEU NEGATIVE 07/15/2019 1222   HGBUR NEGATIVE 07/15/2019 [place] 07/15/2019 1222   KETONESUR 20 (A) 07/15/2019 1222   PROTEINUR 30 (A) 07/15/2019 1222   NITRITE NEGATIVE 07/15/2019 1222   LEUKOCYTESUR NEGATIVE 07/15/2019 1222   Sepsis Labs: Recent Results (from the past 240 hour(s))  SARS Coronavirus 2 by RT PCR (hospital order, performed in [facility] hospital lab) Nasopharyngeal Nasopharyngeal Swab     Status: None   Collection Time: 07/15/19  1:18 PM   Specimen: Nasopharyngeal Swab  Result Value Ref Range Status   SARS Coronavirus 2 NEGATIVE NEGATIVE Final    Comment: (NOTE) SARS-CoV-2 target nucleic acids are NOT DETECTED. The SARS-CoV-2 RNA is generally detectable in upper and lower respiratory specimens during the acute phase of infection. The lowest concentration of SARS-CoV-2 viral copies this assay can detect is 250 copies / mL. A negative result does not preclude SARS-CoV-2 infection and should not be used as the sole basis for treatment or other patient management decisions.  A negative result may occur with improper specimen collection / handling, submission of specimen other than nasopharyngeal swab, presence of viral mutation(s) within the areas targeted by this assay, and inadequate number of viral copies (<250 copies / mL). A negative result must be combined with clinical observations, patient history, and epidemiological information. Fact Sheet for Patients:   StrictlyIdeas.no Fact Sheet  for Healthcare Providers: BankingDealers.co.za This test is not yet approved or cleared  by the [place] FDA and has been authorized for detection and/or diagnosis of SARS-CoV-2 by FDA under an Emergency Use Authorization (EUA).  This EUA will remain in effect (meaning this test can be used) for the duration of the COVID-19 declaration under Section 564(b)(1) of the Act, 21 U.S.C. section 360bbb-3(b)(1), unless the authorization is terminated or revoked sooner. Performed at [facility], [place] [address], [place], [place] [address]   C Difficile Quick Screen w PCR reflex     Status: Abnormal   Collection Time: 07/15/19  1:46 PM   Specimen: Stool  Result Value Ref Range Status   C Diff antigen POSITIVE (A) NEGATIVE Final   C Diff toxin NEGATIVE NEGATIVE Final   C Diff interpretation Results are indeterminate. See PCR results.  Final    Comment: Performed at [facility], [place] [address], [place], [place] [address]  C. Diff by PCR, Reflexed     Status: None   Collection Time: 07/15/19  1:46 PM  Result Value Ref Range Status   Toxigenic C. Difficile by PCR NEGATIVE NEGATIVE Final    Comment: Patient is colonized with non toxigenic C. difficile. May not need treatment unless significant symptoms are present. Performed at [facility], [place] [address] [place], [place] [address]      Radiological Exams on Admission: CT Head Wo Contrast  Result Date: 07/15/2019 CLINICAL DATA:  Altered mental status. EXAM: CT HEAD WITHOUT CONTRAST  TECHNIQUE: Contiguous axial images were obtained from the base of the skull through the vertex without intravenous contrast. COMPARISON:  10/22/2018 FINDINGS: Brain: No sign of acute infarction, mass lesion, hemorrhage, hydrocephalus or extra-axial collection. Minimal small vessel change of the hemispheric deep white matter. Vascular: No abnormal vascular finding. Skull: Normal Sinuses/Orbits: Clear/normal Other: None  IMPRESSION: No acute or significant CT finding. Minimal small vessel change of the hemispheric white matter. Electronically Signed   By: Nelson Chimes M.D.   On: 07/15/2019 13:10   DG Chest Port 1 View  Result Date: 07/15/2019 CLINICAL DATA:  Altered mental status. Confusion. EXAM: PORTABLE CHEST 1 VIEW COMPARISON:  10/22/2018 FINDINGS: Heart size is stable and at the upper limits of normal. Elevation of right hemidiaphragm is unchanged. Mild scarring seen in left lung base. Both lungs are otherwise clear. Posterior thoracic spine fusion hardware again noted. IMPRESSION: Stable exam. No active disease. Electronically Signed   By: Marlaine Hind M.D.   On: 07/15/2019 12:11    EKG: Independently reviewed.  Sinus rhythm at 71 bpm  Assessment/Plan  C. difficile diarrhea: Acute.  Patient found covered in stool.  Some mild tenderness to palpation of the abdomen appreciated. C. difficile screening positive.  Unclear and patient had recently been on antibiotics.   -Admit to a medical telemetry bed -Enteric precautions -Monitor intake and output -Continue oral vancomycin -Normal saline IV fluids at 75 mL/h  Metabolic encephalopathy: Acute.  Patient noted to be acutely altered saying okay to everything.  Lithium level noted to be subtherapeutic. -Neurochecks -Check MRI of the brain  Leukocytosis: Acute.  WBC elevated at 11.3.  Suspect secondary to C. difficile infection. -Recheck WBC in a.m.  Paroxysmal atrial fibrillation, subtherapeutic INR: Patient appears to be in sinus rhythm at this time.  INR subtherapeutic at 1.3.  CHA2DS2-VASc score =4. -Coumadin per pharmacy  Chronic kidney disease stage IIIa: Creatinine noted to be 1.05 on admission which appears near her baseline. -Continue to monitor   Hypothyroidism: TSH 0.341 -Continue levothyroxine  Diabetes mellitus type 2, controlled: She appears to be well controlled as last hemoglobin A1c noted to be 6 in 09/2018.  Patient initial blood  glucose 131.  Home medications only include Metformin. -Hypoglycemic protocols -Hold Metformin -CBGs before every meal with sensitive SSI.  Bipolar disorder: Patient's lithium level noted to be subtherapeutic at 0.22. -Continue home regimen  Chronic pain -Continue patient home regimen as tolerated  Hyperlipidemia -Continue pravastatin  GERD -Continue Protonix  DVT prophylaxis: lovenox Code Status: Full  Family Communication: Brother updated at bedside Disposition Plan:  To be determined Consults called:  None Admission status: *Inpatient  Norval Morton MD [facility] Pager [phone]   If 7PM-7AM, please contact night-coverage [email or web address] Password [facility]  07/15/2019, 6:03 PM

## 2019-07-15 NOTE — ED Notes (Signed)
Attempted to feed pt dinner, pt refused to eat or drink

## 2019-07-15 NOTE — ED Triage Notes (Signed)
Pt arrives via EMS from home alone with reports of LSN Thursday by twin brother. Per EMS, pts baseline is A&O, lives alone and able to speak. Pt will say few words such as yes and okay. Unable to tell me name. Pt covered in stool.

## 2019-07-15 NOTE — Progress Notes (Signed)
[place] for Warfarin Indication: atrial fibrillation  Allergies  Allergen Reactions  . Codeine Anaphylaxis    Patient states she can take codeine but not percocet   . Adhesive [Tape]     blister  . Celebrex [Celecoxib] Hives  . Sulfa Antibiotics Hives  . Tricyclic Antidepressants     Doesn't help  . Amoxicillin Rash  . Ampicillin Rash  . Cephalexin Rash  . Penicillins Rash    Did it involve swelling of the face/tongue/throat, SOB, or low BP? Yes Did it involve sudden or severe rash/hives, skin peeling, or any reaction on the inside of your mouth or nose? NO Did you need to seek medical attention at a hospital or doctor's office? NO When did it last happen? childhood If all above answers are "NO", may proceed with cephalosporin use.    Vital Signs: Temp: 98.3 F (36.8 C) (05/19 1115) Temp Source: Axillary (05/19 1115) BP: 167/71 (05/19 1753) Pulse Rate: 72 (05/19 1753)  Labs: Recent Labs    07/15/19 1150 07/15/19 1421  HGB 13.0  --   HCT 39.0  --   PLT 245  --   LABPROT 15.5*  --   INR 1.3*  --   CREATININE 1.05*  --   TROPONINIHS 8 10    CrCl cannot be calculated (Unknown ideal weight.).   Medical History: Past Medical History:  Diagnosis Date  . Anxiety   . Atrial fibrillation ([place])   . Bipolar 2 disorder ([place])   . Depression   . History of cardioversion    x2  . Hyperlipidemia   . Hypothyroidism   . Migraine headache   . Osteoporosis     Assessment: Patient is a 89 yof that presents to the ED with AMS. It appears that the patient has not been taking her warfarin for a period of time as her INR is 1.3 in the presence of having C.diff. Pharmacy has been asked to dose warfarin while inpatient. Of note patient is being treated for C.diff at this time. Patient was also started on cipro and metronidazole, both of which can significantly increase warfarin sensitivity. Previously ordered dose of 5mg  not given  yet, will dose adjust to 2.5mg .   PTA warfarin regimen: 2.5mg  daily   Goal of Therapy:  INR 2-3 Monitor platelets by anticoagulation protocol: Yes   Plan:  Warfarin 2.5mg  x1 Monitor drug interactions with Cipro and metronidazole Monitor daily INR, CBC/plt Monitor for signs/symptoms of bleeding    Benetta Spar, PharmD, BCPS, BCCP Clinical Pharmacist  Please check AMION for all [place] phone numbers After 10:00 PM, call [place]

## 2019-07-15 NOTE — Progress Notes (Signed)
[place] for Warfarin Indication: atrial fibrillation  Allergies  Allergen Reactions  . Codeine Anaphylaxis    Patient states she can take codeine but not percocet   . Adhesive [Tape]     blister  . Celebrex [Celecoxib] Hives  . Sulfa Antibiotics Hives  . Tricyclic Antidepressants     Doesn't help  . Amoxicillin Rash  . Ampicillin Rash  . Cephalexin Rash  . Penicillins Rash    Did it involve swelling of the face/tongue/throat, SOB, or low BP? Yes Did it involve sudden or severe rash/hives, skin peeling, or any reaction on the inside of your mouth or nose? NO Did you need to seek medical attention at a hospital or doctor's office? NO When did it last happen? childhood If all above answers are "NO", may proceed with cephalosporin use.    Patient Measurements:   Heparin Dosing Weight:   Vital Signs: Temp: 98.3 F (36.8 C) (05/19 1115) Temp Source: Axillary (05/19 1115) BP: 153/68 (05/19 1442) Pulse Rate: 66 (05/19 1442)  Labs: Recent Labs    07/15/19 1150 07/15/19 1421  HGB 13.0  --   HCT 39.0  --   PLT 245  --   LABPROT 15.5*  --   INR 1.3*  --   CREATININE 1.05*  --   TROPONINIHS 8 10    CrCl cannot be calculated (Unknown ideal weight.).   Medical History: Past Medical History:  Diagnosis Date  . Anxiety   . Atrial fibrillation ([place])   . Bipolar 2 disorder ([place])   . Depression   . History of cardioversion    x2  . Hyperlipidemia   . Hypothyroidism   . Migraine headache   . Osteoporosis     Medications:  Scheduled:  . [START ON 07/16/2019] levothyroxine  100 mcg Oral QAC breakfast  . melatonin  15 mg Oral QHS  . [START ON 07/16/2019] metoprolol succinate  50 mg Oral Daily  . pantoprazole  40 mg Oral Daily  . pravastatin  20 mg Oral Daily  . sodium chloride flush  3 mL Intravenous Q12H  . vancomycin  125 mg Oral QID  . verapamil  120 mg Oral QHS  . [START ON 07/16/2019] warfarin  5 mg Oral ONCE-1600   . [START ON 07/16/2019] Warfarin - Pharmacist Dosing Inpatient   Does not apply q1600    Assessment: Patient is a 83 yof that presents to the ED with AMS. It appears that the patient has not been taking her warfarin for a period of time as her INR is 1.3 in the presence of having C.diff. Pharmacy has been asked to dose warfarin while inpatient. Of note patient is being treated for C.diff at this time.   PTA warfarin regimen: 2.5mg  daily   Goal of Therapy:  INR 2-3 Monitor platelets by anticoagulation protocol: Yes   Plan:  - INR subtherapeutic at 1.3  - With subtherapeutic INR will give Warfarin 5 mg X 1 dose  - Daily PT-INR and cbc   Duanne Limerick PharmD. BCPS  07/15/2019,4:45 PM

## 2019-07-15 NOTE — ED Notes (Signed)
Pt continues to refuse to take anything by mouth

## 2019-07-16 ENCOUNTER — Encounter (HOSPITAL_COMMUNITY): Payer: Self-pay | Admitting: Internal Medicine

## 2019-07-16 DIAGNOSIS — A0472 Enterocolitis due to Clostridium difficile, not specified as recurrent: Secondary | ICD-10-CM

## 2019-07-16 LAB — CBC
HCT: 35.4 % — ABNORMAL LOW (ref 36.0–46.0)
Hemoglobin: 11.6 g/dL — ABNORMAL LOW (ref 12.0–15.0)
MCH: 30.1 pg (ref 26.0–34.0)
MCHC: 32.8 g/dL (ref 30.0–36.0)
MCV: 91.9 fL (ref 80.0–100.0)
Platelets: 209 10*3/uL (ref 150–400)
RBC: 3.85 MIL/uL — ABNORMAL LOW (ref 3.87–5.11)
RDW: 12.9 % (ref 11.5–15.5)
WBC: 10.7 10*3/uL — ABNORMAL HIGH (ref 4.0–10.5)
nRBC: 0 % (ref 0.0–0.2)

## 2019-07-16 LAB — BASIC METABOLIC PANEL
Anion gap: 12 (ref 5–15)
BUN: 13 mg/dL (ref 8–23)
CO2: 19 mmol/L — ABNORMAL LOW (ref 22–32)
Calcium: 7.7 mg/dL — ABNORMAL LOW (ref 8.9–10.3)
Chloride: 110 mmol/L (ref 98–111)
Creatinine, Ser: 0.78 mg/dL (ref 0.44–1.00)
GFR calc Af Amer: >60 mL/min (ref >60)
GFR calc non Af Amer: >60 mL/min (ref >60)
Glucose, Bld: 103 mg/dL — ABNORMAL HIGH (ref 70–99)
Potassium: 2.8 mmol/L — ABNORMAL LOW (ref 3.5–5.1)
Sodium: 141 mmol/L (ref 135–145)

## 2019-07-16 LAB — GASTROINTESTINAL PANEL BY PCR, STOOL (REPLACES STOOL CULTURE)

## 2019-07-16 LAB — MAGNESIUM: Magnesium: 1.7 mg/dL (ref 1.7–2.4)

## 2019-07-16 LAB — PROTIME-INR
INR: 1.3 — ABNORMAL HIGH (ref 0.8–1.2)
Prothrombin Time: 15.9 seconds — ABNORMAL HIGH (ref 11.4–15.2)

## 2019-07-16 LAB — GLUCOSE, CAPILLARY
Glucose-Capillary: 94 mg/dL (ref 70–99)
Glucose-Capillary: 103 mg/dL — ABNORMAL HIGH (ref 70–99)
Glucose-Capillary: 88 mg/dL (ref 70–99)
Glucose-Capillary: 85 mg/dL (ref 70–99)
Glucose-Capillary: 89 mg/dL (ref 70–99)

## 2019-07-16 LAB — HEMOGLOBIN A1C
Hgb A1c MFr Bld: 5.8 % — ABNORMAL HIGH (ref 4.8–5.6)
Mean Plasma Glucose: 119.76 mg/dL

## 2019-07-16 MED ORDER — METOPROLOL TARTRATE 5 MG/5ML IV SOLN: 5.0000 mg | INTRAVENOUS | Status: DC | PRN

## 2019-07-16 MED ORDER — BUSPIRONE HCL 5 MG PO TABS
15.0000 mg | ORAL_TABLET | Freq: Two times a day (BID) | ORAL | Status: DC
  Administered 2019-07-18 (×2): 15 mg via ORAL
  Filled 2019-07-16 (×3): qty 3

## 2019-07-16 MED ORDER — PREGABALIN 25 MG PO CAPS
25.0000 mg | ORAL_CAPSULE | Freq: Three times a day (TID) | ORAL | Status: DC
  Administered 2019-07-18 – 2019-07-20 (×9): 25 mg via ORAL
  Filled 2019-07-16 (×10): qty 1

## 2019-07-16 MED ORDER — MAGNESIUM SULFATE IN D5W 1-5 GM/100ML-% IV SOLN
1.0000 g | Freq: Once | INTRAVENOUS | Status: AC
  Administered 2019-07-16: 1 g via INTRAVENOUS
  Filled 2019-07-16: qty 100

## 2019-07-16 MED ORDER — HALOPERIDOL LACTATE 5 MG/ML IJ SOLN: 1.0000 mg | Freq: Four times a day (QID) | INTRAMUSCULAR | Status: DC | PRN

## 2019-07-16 MED ORDER — WARFARIN SODIUM 2.5 MG PO TABS: 2.5000 mg | ORAL_TABLET | Freq: Once | ORAL | Status: DC

## 2019-07-16 MED ORDER — POTASSIUM CHLORIDE 2 MEQ/ML IV SOLN
INTRAVENOUS | Status: AC
  Filled 2019-07-16 (×3): qty 1000

## 2019-07-16 MED ORDER — ENOXAPARIN SODIUM 80 MG/0.8ML ~~LOC~~ SOLN
80.0000 mg | Freq: Two times a day (BID) | SUBCUTANEOUS | Status: DC
  Administered 2019-07-16 – 2019-07-20 (×9): 80 mg via SUBCUTANEOUS
  Filled 2019-07-16 (×9): qty 0.8

## 2019-07-16 MED ORDER — LAMOTRIGINE 25 MG PO TABS
25.0000 mg | ORAL_TABLET | Freq: Two times a day (BID) | ORAL | Status: DC
  Administered 2019-07-18 – 2019-07-19 (×4): 25 mg via ORAL
  Filled 2019-07-16 (×7): qty 1

## 2019-07-16 MED ORDER — QUETIAPINE FUMARATE ER 200 MG PO TB24
200.0000 mg | ORAL_TABLET | Freq: Every day | ORAL | Status: DC
  Administered 2019-07-18: 200 mg via ORAL
  Filled 2019-07-16 (×2): qty 1

## 2019-07-16 MED ORDER — VANCOMYCIN 50 MG/ML ORAL SOLUTION
125.0000 mg | Freq: Four times a day (QID) | ORAL | Status: DC
  Filled 2019-07-16 (×6): qty 2.5

## 2019-07-16 NOTE — Progress Notes (Signed)
PT refusing to take medication, closes her lips tights and moves face to the slide. PT constantly says "yes" , but doesn't answer any question. Will continue to attempt.

## 2019-07-16 NOTE — Progress Notes (Signed)
New Admission Note: ? Arrival Method: via stretcher  Mental Orientation: Disoriented x 4 Telemetry: Box #1 NSR Assessment: Completed Skin: Refer to flowsheet IV: Right hand and Left FA Pain: 0/10 Tubes: Safety Measures: Safety Fall Prevention Plan discussed with patient. Admission: unable to complete health hx at this time / pt. confused 5 [place] Orientation: Patient has been orientated to the room, unit and the staff. Family: Orders have been reviewed and are being implemented. Will continue to monitor the patient. Call light has been placed within reach and bed alarm has been activated.  ? [employer], [place]

## 2019-07-16 NOTE — Plan of Care (Signed)
?  Problem: Clinical Measurements: ?Goal: Will remain free from infection ?Outcome: Progressing ?  ?

## 2019-07-16 NOTE — Plan of Care (Signed)
  Problem: Activity: Goal: Risk for activity intolerance will decrease Outcome: Not Progressing   

## 2019-07-16 NOTE — Progress Notes (Signed)
PROGRESS NOTE    Meredith [name]  [id number] DOB: 24-Feb-1951 DOA: 07/15/2019 PCP: Meredith Loader, FNP    Chief Complaint  Patient presents with  . Altered Mental Status    Brief Narrative:  69 year old bipolar disorder followed Dr. Clovis [name] psychiatry, permanent A. fib/Coumadin CHADS2 score >3 status post 2 ablations sees Dr. Debbrah [name] sub-AC Chronic migraines prior Rx Botox M [facility] Prior back surgeries-used to see pain management Dr. Megan [name] Partial lung lobectomy and thoracic spine surgery AAA HLD hypothyroid, CKD 2-3 Admit 0000000 metabolic encephalopathy lithium toxicity Represents 07/15/2019 acutely altered-lives at home by self-missed 2 medical appointments EMS was called found covered in stool ED work-up = BP 180 WBC 11.2 BUN/creatinine 20/1.05 Lithium level 0.22 anion gap 17 CT head - Started empirically on C. difficile Rx vancomycin-transition to Cipro Flagyl as unable to tolerate p.o. Pathogen panel pending  Assessment & Plan:   Principal Problem:   C. difficile diarrhea Active Problems:   PAF (paroxysmal atrial fibrillation) (HCC)   Bipolar disorder, curr episode mixed, severe, with psychotic features (HCC)   Hypothyroidism   Chronic kidney disease, stage III (moderate)   Leukocytosis   Diabetes mellitus type 2, controlled (HCC)  C. difficile colitis  Empirically treating for colitis of unknown origin with Cipro Flagyl however given testing showing antigen positive toxin negative we will still wait on gastrointestinal panel and treat with Flagyl monotherapy at this time-may have to add back Cipro if clinically worsens develops a white count or fever  It appears that the patient is refusing to take p.o. vancomycin and Flagyl is not recommended usually for Rx of C. difficile--I have discussed clearly and extensively with her brother at the bedside that vancomycin enemas may be in her near future because she may also refuse IV  therapies  Repeat labs a.m. Toxic metabolic encephalopathy likely from metabolic causes with superimposed AKI and poor clearance of psychotropic meds  Patient on multiple psychotropic medications-she however is refusing both my suggestion to take medications in addition to the nurses coaxing of her taking meds  I will asked psychiatry to see her-I have cut back multiple meds in case she is able to take p.o. BuSpar has been cut back from 30-15, lithium remains the same, Seroquel from 300 200 at bedtime, Lamictal and Lyrica have also been cut back Paroxysmal A. fib status post cardioversions in the past  Patient refusing medications discontinue warfarin at this time placed on Lovenox per pharmacy  We will continue IV metoprolol-if she is willing can continue home meds of verapamil 120 daily, metoprolol XL 50 daily  Continue aspirin 325 daily AKI on admission Severe hypokalemia requiring IV repletion  Improving-continue saline but change it to D5 normal saline with 40 of K repeat labs after 24 hours of infusion change fluids as needed Bipolar on meds with prior history of polypharmacy  See above discussion Chronic pain under management pain management Dr. Megan [name]  Cut off all pain meds at this time Leukocytosis  Secondary to C. difficile DM TY 2 nephropathy neuropathy-at home on Metformin 500 twice daily only-A1c this admission 5.8  Continue sliding scale coverage only sensitive coverage-sugars ranging from 94-100 keep hypoglycemic protocol in place as she is not eating at all ?  Senile vaginitis versus HRT  Holding at this time estradiol tablets-shared decision making in the outpatient setting Hypothyroid  TSH slightly low but do not know what to make of this sometimes geriatric hypothyroidism presents with apathy-would recheck   DVT prophylaxis: Lovenox  therapeutic dosing for A. fib Code Status: Full code Family Communication: Long discussion with brother Meredith [name] at the  bedside Disposition:   Status is: Inpatient  Remains inpatient appropriate because:Persistent severe electrolyte disturbances, Ongoing diagnostic testing needed not appropriate for outpatient work up, Unsafe d/c plan and IV treatments appropriate due to intensity of illness or inability to take PO   Dispo: The patient is from: Home              Anticipated d/c is to: SNF              Anticipated d/c date is: > 3 days              Patient currently is not medically stable to d/c.        Consultants:   Psychiatry  Procedures: None  Antimicrobials: Flagyl IV Attempting vancomycin again on 5/20   Subjective: Monosyllabic answers "yes" Answers no "  Cannot obtain reliable ROS Attempted multiple times to give patient milk/water she refuses p.o. completely  Objective: Vitals:   07/15/19 2331 07/16/19 0007 07/16/19 0434 07/16/19 0509  BP:  (!) 156/68 121/62 137/84  Pulse:  68 82 79  Resp:  18 18 20   Temp: 99.8 F (37.7 C) 97.8 F (36.6 C) 98.7 F (37.1 C) 99.9 F (37.7 C)  TempSrc: Axillary   Oral  SpO2:  99% 98% 98%    Intake/Output Summary (Last 24 hours) at 07/16/2019 0753 Last data filed at 07/16/2019 0506 Gross per 24 hour  Intake 1892.98 ml  Output 400 ml  Net 1492.98 ml   There were no vitals filed for this visit.  Examination:  General exam: Flat affect the edentulous moist mucosa no icterus no pallor Respiratory system: CTA B no added sound Cardiovascular system: S1-S2 no murmur rub or gallop rate controlled flutter Gastrointestinal system: Soft no rebound no organomegaly. Central nervous system: Confused moving all 4 limbs equally but does not cooperate with finger-nose-finger testing  Extremities: no lower extremity edema Skin: Intact Psychiatry: Flat affect and difficult to discern    Data Reviewed: I have personally reviewed following labs and imaging studies Potassium 2.8 CO2 19 down from 20 Calcium 9.9-->7.7 Albumin 4.3 WBC  11.3-->10.7 Hemoglobin 13.0-->11.6 Ammonia less than 9 troponin negative  Radiology Studies: CT Head Wo Contrast  Result Date: 07/15/2019 CLINICAL DATA:  Altered mental status. EXAM: CT HEAD WITHOUT CONTRAST TECHNIQUE: Contiguous axial images were obtained from the base of the skull through the vertex without intravenous contrast. COMPARISON:  10/22/2018 FINDINGS: Brain: No sign of acute infarction, mass lesion, hemorrhage, hydrocephalus or extra-axial collection. Minimal small vessel change of the hemispheric deep white matter. Vascular: No abnormal vascular finding. Skull: Normal Sinuses/Orbits: Clear/normal Other: None IMPRESSION: No acute or significant CT finding. Minimal small vessel change of the hemispheric white matter. Electronically Signed   By: Nelson Chimes M.D.   On: 07/15/2019 13:10   MR BRAIN WO CONTRAST  Result Date: 07/15/2019 CLINICAL DATA:  69 year old female with encephalopathy. Unexplained altered mental status. EXAM: MRI HEAD WITHOUT CONTRAST TECHNIQUE: Multiplanar, multiecho pulse sequences of the brain and surrounding structures were obtained without intravenous contrast. COMPARISON:  Brain MRI 10/23/2018. Head CT earlier today. FINDINGS: Brain: Stable cerebral volume. No restricted diffusion to suggest acute infarction. No midline shift, mass effect, evidence of mass lesion, ventriculomegaly, extra-axial collection or acute intracranial hemorrhage. Cervicomedullary junction and pituitary are within normal limits. Unchanged mild ventricular prominence, but as before the temporal horns are decompressed. [place] Cables and white matter signal  remains normal for age. No convincing encephalomalacia. No chronic cerebral blood products. Vascular: Major intracranial vascular flow voids are preserved. Dominant left vertebral artery. Skull and upper cervical spine: Partially obscured visible cervical spine today due to motion. Visible bone marrow signal appears to remain normal. Sinuses/Orbits:  Stable and negative. Other: Mastoids remain clear. Visible internal auditory structures appear normal. Scalp and face soft tissues appear negative. IMPRESSION: No acute intracranial abnormality. Stable since 2020 and largely normal for age noncontrast MRI appearance of the brain. Electronically Signed   By: Genevie Ann M.D.   On: 07/15/2019 19:22   DG Chest Port 1 View  Result Date: 07/15/2019 CLINICAL DATA:  Altered mental status. Confusion. EXAM: PORTABLE CHEST 1 VIEW COMPARISON:  10/22/2018 FINDINGS: Heart size is stable and at the upper limits of normal. Elevation of right hemidiaphragm is unchanged. Mild scarring seen in left lung base. Both lungs are otherwise clear. Posterior thoracic spine fusion hardware again noted. IMPRESSION: Stable exam. No active disease. Electronically Signed   By: Marlaine Hind M.D.   On: 07/15/2019 12:11      Scheduled Meds: . busPIRone  30 mg Oral BID  . insulin aspart  0-9 Units Subcutaneous TID WC  . lamoTRIgine  100 mg Oral BID  . levothyroxine  100 mcg Oral QAC breakfast  . lithium carbonate  150 mg Oral Daily  . lithium carbonate  300 mg Oral QHS  . loratadine  10 mg Oral QPM  . melatonin  15 mg Oral QHS  . metoprolol succinate  50 mg Oral Daily  . pantoprazole  40 mg Oral Daily  . pravastatin  20 mg Oral Daily  . pregabalin  50 mg Oral TID  . QUEtiapine  300 mg Oral QHS  . senna-docusate  2 tablet Oral BID  . sodium chloride flush  3 mL Intravenous Q12H  . verapamil  120 mg Oral QHS  . warfarin  2.5 mg Oral ONCE-1600  . warfarin  2.5 mg Oral ONCE-1600  . Warfarin - Pharmacist Dosing Inpatient   Does not apply q1600   Continuous Infusions: . sodium chloride 75 mL/hr at 07/15/19 1938  . ciprofloxacin Stopped (07/15/19 2050)  . metronidazole 500 mg (07/16/19 0314)     LOS: 1 day    Time spent: 39    Nita Sells, MD [facility]   To contact the attending provider between 7A-7P or the covering provider during after hours  7P-7A, please log into the web site [email or web address] and access using universal [place] password for that web site. If you do not have the password, please call the hospital operator.  07/16/2019, 7:53 AM

## 2019-07-16 NOTE — Progress Notes (Addendum)
[facility] for Warfarin>>Enoxaparin Indication: atrial fibrillation  Allergies  Allergen Reactions  . Codeine Anaphylaxis    Patient states she can take codeine but not percocet   . Adhesive [Tape]     blister  . Celebrex [Celecoxib] Hives  . Sulfa Antibiotics Hives  . Tricyclic Antidepressants     Doesn't help  . Amoxicillin Rash  . Ampicillin Rash  . Cephalexin Rash  . Penicillins Rash    Did it involve swelling of the face/tongue/throat, SOB, or low BP? Yes Did it involve sudden or severe rash/hives, skin peeling, or any reaction on the inside of your mouth or nose? NO Did you need to seek medical attention at a hospital or doctor's office? NO When did it last happen? childhood If all above answers are "NO", may proceed with cephalosporin use.    Vital Signs: Temp: 99.9 F (37.7 C) (05/20 0509) Temp Source: Oral (05/20 0509) BP: 137/84 (05/20 0509) Pulse Rate: 79 (05/20 0509)  Labs: Recent Labs    07/15/19 1150 07/15/19 1421 07/16/19 0645  HGB 13.0  --  11.6*  HCT 39.0  --  35.4*  PLT 245  --  209  LABPROT 15.5*  --  15.9*  INR 1.3*  --  1.3*  CREATININE 1.05*  --   --   TROPONINIHS 8 10  --     CrCl cannot be calculated (Unknown ideal weight.).   Medical History: Past Medical History:  Diagnosis Date  . Anxiety   . Atrial fibrillation ([place])   . Bipolar 2 disorder ([place])   . Depression   . History of cardioversion    x2  . Hyperlipidemia   . Hypothyroidism   . Migraine headache   . Osteoporosis     Assessment: Patient is a 35 yof that presents to the ED with AMS. It appears that the patient has not been taking her warfarin for a period of time as her INR is 1.3 in the presence of having C.diff. Pharmacy has been asked to dose warfarin while inpatient. Of note patient is being treated for C.diff at this time. Patient was also started on cipro and metronidazole, both of which can significantly increase  warfarin sensitivity.   Patient refused all oral meds yesterday, and therefore did not get 2.5mg  of warfarin. INR today is still 1.3   PTA warfarin regimen: 2.5mg  daily   Goal of Therapy:  INR 2-3 Monitor platelets by anticoagulation protocol: Yes   Plan:  Warfarin 2.5mg  x1 Monitor drug interactions with Cipro and metronidazole Monitor daily INR, CBC/plt Monitor for signs/symptoms of bleeding    Karianne Nogueira A. Levada Dy, PharmD, BCPS, FNKF Clinical Pharmacist [place] Please utilize Amion for appropriate phone number to reach the unit pharmacist ([place])  Addendum: Patient refusing PO meds. Warfarin placed on hold for now. Consult received to change patient to lovenox SQ for afib. Patient weights ~77 kg. CrCl ~61. Will dose enoxaparin 80mg  SQ q12h to start now.  Will monitor bleeding.  Delesa Kawa A. Levada Dy, PharmD, BCPS, FNKF Clinical Pharmacist [place] Please utilize Amion for appropriate phone number to reach the unit pharmacist ([place])

## 2019-07-16 NOTE — Progress Notes (Signed)
Pt continues to refuse all of her PO medications during this shift. RN tried several of times to administer medication. RN even tried to administer medications with the assistance of the PT's twin brother, but without any success; pt spat out what brother put in her mouth. MD [name] made aware. Will pass on information to oncoming RN

## 2019-07-16 NOTE — Progress Notes (Signed)
Patient's HR 143 (non-sustained) @0502  d/t vomiting. Pt. Given PRN Zofran IV. VS rechecked stable. Will continue to monitor.

## 2019-07-16 NOTE — Consult Note (Signed)
  Messaged Arta Silence, RN to set up [facility] iPad for psychiatric consult at 1:28 PM and no response.  Will attempt to consult tomorrow.  I did review chart/medication and made suggestions.    Psychiatric consult order for medication suggestion and evaluation related to patient being delirious possibly related to encephalopathy and wanting a safe alternative medication related to patient already on multiple medications.  Patient chart and medications reviewed.   Current psychotropic medications Buspar 15 mg Bid Lamictal 25 mg Bid Seroquel XR 200 mg Q hs Patient is also given Hydralazine 10 mg Q 4 hr. prn for her blood pressure. Recommendation: Correcting delirium focus is mainly treating underlying cause (medical condition) that is causing delirium.  Antipsychotics can be used to manage agitation of patient during delirium episode. Haloperidol low dose standard therapy.   Haloperidol (0.5 to 1 mg) be used as needed to control moderate to severe agitation or psychotic symptoms, up to a maximum dose of 5 mg per day Seroquel 25 to 50 mg Bid.

## 2019-07-17 DIAGNOSIS — R197 Diarrhea, unspecified: Secondary | ICD-10-CM

## 2019-07-17 DIAGNOSIS — F3164 Bipolar disorder, current episode mixed, severe, with psychotic features: Secondary | ICD-10-CM

## 2019-07-17 DIAGNOSIS — E118 Type 2 diabetes mellitus with unspecified complications: Secondary | ICD-10-CM

## 2019-07-17 DIAGNOSIS — E039 Hypothyroidism, unspecified: Secondary | ICD-10-CM

## 2019-07-17 DIAGNOSIS — I480 Paroxysmal atrial fibrillation: Secondary | ICD-10-CM

## 2019-07-17 DIAGNOSIS — E876 Hypokalemia: Secondary | ICD-10-CM

## 2019-07-17 DIAGNOSIS — N179 Acute kidney failure, unspecified: Secondary | ICD-10-CM

## 2019-07-17 DIAGNOSIS — Z9189 Other specified personal risk factors, not elsewhere classified: Secondary | ICD-10-CM

## 2019-07-17 DIAGNOSIS — D72829 Elevated white blood cell count, unspecified: Secondary | ICD-10-CM

## 2019-07-17 DIAGNOSIS — G9341 Metabolic encephalopathy: Secondary | ICD-10-CM

## 2019-07-17 LAB — RENAL FUNCTION PANEL
Albumin: 3.4 g/dL — ABNORMAL LOW (ref 3.5–5.0)
Anion gap: 8 (ref 5–15)
BUN: 16 mg/dL (ref 8–23)
CO2: 21 mmol/L — ABNORMAL LOW (ref 22–32)
Calcium: 8.3 mg/dL — ABNORMAL LOW (ref 8.9–10.3)
Chloride: 113 mmol/L — ABNORMAL HIGH (ref 98–111)
Creatinine, Ser: 0.82 mg/dL (ref 0.44–1.00)
GFR calc Af Amer: >60 mL/min (ref >60)
GFR calc non Af Amer: >60 mL/min (ref >60)
Glucose, Bld: 119 mg/dL — ABNORMAL HIGH (ref 70–99)
Phosphorus: 1.9 mg/dL — ABNORMAL LOW (ref 2.5–4.6)
Potassium: 3.7 mmol/L (ref 3.5–5.1)
Sodium: 142 mmol/L (ref 135–145)

## 2019-07-17 LAB — GLUCOSE, CAPILLARY
Glucose-Capillary: 127 mg/dL — ABNORMAL HIGH (ref 70–99)
Glucose-Capillary: 110 mg/dL — ABNORMAL HIGH (ref 70–99)
Glucose-Capillary: 89 mg/dL (ref 70–99)
Glucose-Capillary: 108 mg/dL — ABNORMAL HIGH (ref 70–99)

## 2019-07-17 LAB — CBC
HCT: 39.2 % (ref 36.0–46.0)
Hemoglobin: 12.8 g/dL (ref 12.0–15.0)
MCH: 29.8 pg (ref 26.0–34.0)
MCHC: 32.7 g/dL (ref 30.0–36.0)
MCV: 91.4 fL (ref 80.0–100.0)
Platelets: 244 10*3/uL (ref 150–400)
RBC: 4.29 MIL/uL (ref 3.87–5.11)
RDW: 12.9 % (ref 11.5–15.5)
WBC: 9.8 10*3/uL (ref 4.0–10.5)
nRBC: 0 % (ref 0.0–0.2)

## 2019-07-17 LAB — MAGNESIUM: Magnesium: 2.5 mg/dL — ABNORMAL HIGH (ref 1.7–2.4)

## 2019-07-17 LAB — PROTIME-INR
INR: 1.3 — ABNORMAL HIGH (ref 0.8–1.2)
Prothrombin Time: 15.7 seconds — ABNORMAL HIGH (ref 11.4–15.2)

## 2019-07-17 LAB — VITAMIN B12: Vitamin B-12: 284 pg/mL (ref 180–914)

## 2019-07-17 MED ORDER — POTASSIUM PHOSPHATES 15 MMOLE/5ML IV SOLN
20.0000 mmol | Freq: Once | INTRAVENOUS | Status: AC
  Administered 2019-07-17: 20 mmol via INTRAVENOUS
  Filled 2019-07-17: qty 6.67

## 2019-07-17 MED ORDER — THIAMINE HCL 100 MG/ML IJ SOLN
250.0000 mg | Freq: Every day | INTRAVENOUS | Status: DC
  Administered 2019-07-17 – 2019-07-20 (×4): 250 mg via INTRAVENOUS
  Filled 2019-07-17 (×4): qty 2.5

## 2019-07-17 MED ORDER — LOPERAMIDE HCL 2 MG PO CAPS
2.0000 mg | ORAL_CAPSULE | ORAL | Status: DC | PRN
  Administered 2019-07-18 – 2019-07-19 (×2): 2 mg via ORAL
  Filled 2019-07-17 (×2): qty 1

## 2019-07-17 NOTE — Progress Notes (Signed)
Patient continues to refuse PO medications. Will continue to encourage.

## 2019-07-17 NOTE — Progress Notes (Signed)
PROGRESS NOTE  Meredith [name] [id number] DOB: Dec 07, 1950   PCP: Kristen Loader, FNP  Patient is from: Home.  At baseline able to complete her ADLs without assistance.  DOA: 07/15/2019 LOS: 2  Brief Narrative / Interim history: 69 year old female with history of bipolar disorder, A. fib on Coumadin, AAA, HTN, HLD and hypothyroidism brought to ED by EMS due to acute altered mental status.  She was found covered in stool when EMS arrived.  In ED, afebrile.  BP elevated.  Other vital signs maintained.  WBC 11.2. Cr 1.05.  Lithium level 0.22. AG 17.  CT head and CXR negative for acute finding.  UDS positive for opiates.  UA without signs of infection.  Patient was started on oral vancomycin.  Received a liter of normal saline bolus, and admitted for possible colitis.  Patient awake and alert but continues to remain confused.  C. difficile screen positive for antigen but negative for toxin.  PCR negative.  GIP negative.   Subjective: No major events overnight or this morning.  Continues to refuse her medications.  She is awake and alert but only oriented to self.  Does not provide much history.  She denies pain.   Objective: Vitals:   07/16/19 1745 07/16/19 2247 07/17/19 0553 07/17/19 0800  BP: 126/64 133/73 (!) 156/68 (!) 148/66  Pulse: 86 70 73 70  Resp: 12 14 16 17   Temp: 98.7 F (37.1 C) 98.9 F (37.2 C) 98 F (36.7 C) 97.8 F (36.6 C)  TempSrc: Oral   Oral  SpO2: 97% 90% 97% 96%  Weight:      Height:        Intake/Output Summary (Last 24 hours) at 07/17/2019 1144 Last data filed at 07/17/2019 0800 Gross per 24 hour  Intake 1575.9 ml  Output 400 ml  Net 1175.9 ml   Filed Weights   07/16/19 1145  Weight: 77.1 kg    Examination:  GENERAL: No apparent distress.  Nontoxic. HEENT: MMM.  Vision and hearing grossly intact.  NECK: Supple.  No apparent JVD.  RESP: 97% on room air.  No IWOB.  Fair aeration bilaterally. CVS:  RRR. Heart sounds normal.  ABD/GI/GU: BS+.  Abd soft, NTND.  MSK/EXT:  Moves extremities. No apparent deformity. No edema.  SKIN: no apparent skin lesion or wound NEURO: Awake, alert but only oriented to self.  Barely comprehends or follows command.  No apparent focal neuro deficit. PSYCH: Calm. Normal affect.   Procedures:  None  Microbiology summarized: [id number] negative. C. difficile antigen positive but toxin and PCR negative. GIP negative.  Assessment & Plan: Acute metabolic encephalopathy: POA.  Unclear etiology yet. On multiple medications which could potentially contribute.  CT head, TFT and ammonia unrevealing.  She is awake and alert but only oriented to self.  No focal neuro deficit. -Check vitamin B1, B12 and RPR -Empiric high-dose thiamine -MRI?  Not sure if she could cooperate. -Appreciate recs by psych  Colitis?  POA.  It seems she is colonized by C. Difficile but doubt active infection.  GIP and C. difficile toxin and PCR are negative.  Abdominal exam is benign.  No significant leukocytosis or fever.  Diarrhea could be due to Metformin. -Discontinue vancomycin and Flagyl and monitor. -I will start Imodium -CT abdomen and pelvis if further diarrhea  Paroxysmal A. Fib: Rate controlled.  On metoprolol, verapamil and warfarin at home.  Subtherapeutic INR on admission.  Followed by Dr. Katharina Caper.  Had ablation in the past. -On verapamil, metoprolol  and Lovenox here. -I will consider DOAC if she needs to be on anticoagulation going forward.  CHA2DS2-VASc score 4.  AKI: POA-resolved.  Thought to have CKD but doubt.  Anxiety/bipolar disorder: On significant dose of BuSpar, Lamictal, lithium and Seroquel at home.  Followed by Dr. Clovis Pu. -Continued home medication with reduced dose here -Appreciate psych input  Chronic pain: Followed by [name] outpatient.  Seen baclofen on her medication list. -Scheduled Tylenol here   Controlled DM-2: A1c 5.8%.  On Metformin at home. Recent Labs    07/16/19 2249  07/17/19 0649 07/17/19 1059  GLUCAP 89 127* 110*  -Continue SSI and CBG monitoring   Mild leukocytosis: Resolved.   Hypothyroidism: TFTs slightly low.  Free T4 not reliable while she is on Synthroid. -Continue home Synthroid and recheck TSH in 4 to 6 weeks  At risk for polypharmacy -The minimum the better.  Will fine-tune her home medications  Hypokalemia -We will recheck and replenish as appropriate  AAA -Outpatient follow-up  History of migraine: Stable           DVT prophylaxis: On therapeutic Lovenox for A. fib Code Status: Full code Family Communication: Patient and/or RN. Available if any question.  Status is: Inpatient  Remains inpatient appropriate because:Altered mental status, IV treatments appropriate due to intensity of illness or inability to take PO and Inpatient level of care appropriate due to severity of illness   Dispo: The patient is from: Home              Anticipated d/c is to: SNF              Anticipated d/c date is: 2 days              Patient currently is not medically stable to d/c.        Consultants:  Psychiatry   Sch Meds:  Scheduled Meds: . busPIRone  15 mg Oral BID  . enoxaparin (LOVENOX) injection  80 mg Subcutaneous Q12H  . insulin aspart  0-9 Units Subcutaneous TID WC  . lamoTRIgine  25 mg Oral BID  . levothyroxine  100 mcg Oral QAC breakfast  . lithium carbonate  150 mg Oral Daily  . lithium carbonate  300 mg Oral QHS  . loratadine  10 mg Oral QPM  . metoprolol succinate  50 mg Oral Daily  . pregabalin  25 mg Oral TID  . QUEtiapine  200 mg Oral QHS  . sodium chloride flush  3 mL Intravenous Q12H  . verapamil  120 mg Oral QHS   Continuous Infusions: PRN Meds:.albuterol, haloperidol lactate, hydrALAZINE, loperamide, metoprolol tartrate, [DISCONTINUED] ondansetron **OR** ondansetron (ZOFRAN) IV  Antimicrobials: Anti-infectives (From admission, onward)   Start     Dose/Rate Route Frequency Ordered Stop   07/16/19  1045  vancomycin (VANCOCIN) 50 mg/mL oral solution 125 mg  Status:  Discontinued     125 mg Oral 4 times daily 07/16/19 1032 07/17/19 1140   07/15/19 1930  metroNIDAZOLE (FLAGYL) IVPB 500 mg  Status:  Discontinued     500 mg 100 mL/hr over 60 Minutes Intravenous Every 8 hours 07/15/19 1916 07/17/19 1140   07/15/19 1930  ciprofloxacin (CIPRO) IVPB 400 mg  Status:  Discontinued     400 mg 200 mL/hr over 60 Minutes Intravenous Every 12 hours 07/15/19 1916 07/16/19 0924   07/15/19 1500  vancomycin (VANCOCIN) 50 mg/mL oral solution 125 mg  Status:  Discontinued     125 mg Oral 4 times daily 07/15/19  1456 07/15/19 2105       I have personally reviewed the following labs and images: CBC: Recent Labs  Lab 07/15/19 1150 07/16/19 0645 07/17/19 0437  WBC 11.3* 10.7* 9.8  NEUTROABS 9.1*  --   --   HGB 13.0 11.6* 12.8  HCT 39.0 35.4* 39.2  MCV 91.5 91.9 91.4  PLT 245 209 244   BMP &GFR Recent Labs  Lab 07/15/19 1150 07/16/19 0645  NA 142 141  K 3.5 2.8*  CL 105 110  CO2 20* 19*  GLUCOSE 131* 103*  BUN 20 13  CREATININE 1.05* 0.78  CALCIUM 9.9 7.7*  MG 1.9 1.7  PHOS 2.4*  --    Estimated Creatinine Clearance: 60.9 mL/min (by C-G formula based on SCr of 0.78 mg/dL). Liver & Pancreas: Recent Labs  Lab 07/15/19 1150  AST 20  ALT 14  ALKPHOS 43  BILITOT 0.9  PROT 6.9  ALBUMIN 4.3   No results for input(s): LIPASE, AMYLASE in the last 168 hours. Recent Labs  Lab 07/15/19 1316  AMMONIA <9*   Diabetic: Recent Labs    07/16/19 0645  HGBA1C 5.8*   Recent Labs  Lab 07/16/19 1146 07/16/19 1618 07/16/19 2249 07/17/19 0649 07/17/19 1059  GLUCAP 88 85 89 127* 110*   Cardiac Enzymes: No results for input(s): CKTOTAL, CKMB, CKMBINDEX, TROPONINI in the last 168 hours. No results for input(s): PROBNP in the last 8760 hours. Coagulation Profile: Recent Labs  Lab 07/15/19 1150 07/16/19 0645 07/17/19 0437  INR 1.3* 1.3* 1.3*   Thyroid Function Tests: Recent Labs     07/15/19 1316  TSH 0.341*   Lipid Profile: No results for input(s): CHOL, HDL, LDLCALC, TRIG, CHOLHDL, LDLDIRECT in the last 72 hours. Anemia Panel: No results for input(s): VITAMINB12, FOLATE, FERRITIN, TIBC, IRON, RETICCTPCT in the last 72 hours. Urine analysis:    Component Value Date/Time   COLORURINE YELLOW 07/15/2019 1222   APPEARANCEUR CLEAR 07/15/2019 1222   LABSPEC 1.026 07/15/2019 1222   PHURINE 6.0 07/15/2019 1222   GLUCOSEU NEGATIVE 07/15/2019 1222   HGBUR NEGATIVE 07/15/2019 [place] 07/15/2019 1222   KETONESUR 20 (A) 07/15/2019 1222   PROTEINUR 30 (A) 07/15/2019 1222   NITRITE NEGATIVE 07/15/2019 1222   LEUKOCYTESUR NEGATIVE 07/15/2019 1222   Sepsis Labs: Invalid input(s): PROCALCITONIN, [place]  Microbiology: Recent Results (from the past 240 hour(s))  SARS Coronavirus 2 by RT PCR (hospital order, performed in [facility] hospital lab) Nasopharyngeal Nasopharyngeal Swab     Status: None   Collection Time: 07/15/19  1:18 PM   Specimen: Nasopharyngeal Swab  Result Value Ref Range Status   SARS Coronavirus 2 NEGATIVE NEGATIVE Final    Comment: (NOTE) SARS-CoV-2 target nucleic acids are NOT DETECTED. The SARS-CoV-2 RNA is generally detectable in upper and lower respiratory specimens during the acute phase of infection. The lowest concentration of SARS-CoV-2 viral copies this assay can detect is 250 copies / mL. A negative result does not preclude SARS-CoV-2 infection and should not be used as the sole basis for treatment or other patient management decisions.  A negative result may occur with improper specimen collection / handling, submission of specimen other than nasopharyngeal swab, presence of viral mutation(s) within the areas targeted by this assay, and inadequate number of viral copies (<250 copies / mL). A negative result must be combined with clinical observations, patient history, and epidemiological information. Fact  Sheet for Patients:   StrictlyIdeas.no Fact Sheet for Healthcare Providers: BankingDealers.co.za This test is not  yet approved or cleared  by the [place] and has been authorized for detection and/or diagnosis of SARS-CoV-2 by FDA under an Emergency Use Authorization (EUA).  This EUA will remain in effect (meaning this test can be used) for the duration of the COVID-19 declaration under Section 564(b)(1) of the Act, 21 U.S.C. section 360bbb-3(b)(1), unless the authorization is terminated or revoked sooner. Performed at [facility], [place] [address], [place], [place] [address]   Gastrointestinal Panel by PCR , Stool     Status: None   Collection Time: 07/15/19  1:46 PM   Specimen: Stool  Result Value Ref Range Status   Campylobacter species NOT DETECTED NOT DETECTED Final   Plesimonas shigelloides NOT DETECTED NOT DETECTED Final   Salmonella species NOT DETECTED NOT DETECTED Final   Yersinia enterocolitica NOT DETECTED NOT DETECTED Final   Vibrio species NOT DETECTED NOT DETECTED Final   Vibrio cholerae NOT DETECTED NOT DETECTED Final   Enteroaggregative E coli (EAEC) NOT DETECTED NOT DETECTED Final   Enteropathogenic E coli (EPEC) NOT DETECTED NOT DETECTED Final   Enterotoxigenic E coli (ETEC) NOT DETECTED NOT DETECTED Final   Shiga like toxin producing E coli (STEC) NOT DETECTED NOT DETECTED Final   Shigella/Enteroinvasive E coli (EIEC) NOT DETECTED NOT DETECTED Final   Cryptosporidium NOT DETECTED NOT DETECTED Final   Cyclospora cayetanensis NOT DETECTED NOT DETECTED Final   Entamoeba histolytica NOT DETECTED NOT DETECTED Final   Giardia lamblia NOT DETECTED NOT DETECTED Final   Adenovirus F40/41 NOT DETECTED NOT DETECTED Final   Astrovirus NOT DETECTED NOT DETECTED Final   Norovirus GI/GII NOT DETECTED NOT DETECTED Final   Rotavirus A NOT DETECTED NOT DETECTED Final   Sapovirus (I, II, IV, and V) NOT DETECTED NOT  DETECTED Final    Comment: Performed at [facility], [address] [place], [place] [address]  C Difficile Quick Screen w PCR reflex     Status: Abnormal   Collection Time: 07/15/19  1:46 PM   Specimen: Stool  Result Value Ref Range Status   C Diff antigen POSITIVE (A) NEGATIVE Final   C Diff toxin NEGATIVE NEGATIVE Final   C Diff interpretation Results are indeterminate. See PCR results.  Final    Comment: Performed at [facility], [place] [address] [place], [place] [address]  C. Diff by PCR, Reflexed     Status: None   Collection Time: 07/15/19  1:46 PM  Result Value Ref Range Status   Toxigenic C. Difficile by PCR NEGATIVE NEGATIVE Final    Comment: Patient is colonized with non toxigenic C. difficile. May not need treatment unless significant symptoms are present. Performed at [facility], [place] [address] [place], [place] [address]     Radiology Studies: No results found.    Tejuan Gholson T. [place]  If 7PM-7AM, please contact night-coverage [email or web address] Password [facility] 07/17/2019, 11:44 AM

## 2019-07-17 NOTE — Plan of Care (Signed)
  Problem: Coping: Goal: Level of anxiety will decrease Outcome: Progressing   Problem: Elimination: Goal: Will not experience complications related to bowel motility Outcome: Progressing   

## 2019-07-17 NOTE — Consult Note (Signed)
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 difficile diarrhea Diagnosis:  Principal Problem:   C. difficile diarrhea Active Problems:   PAF (paroxysmal atrial fibrillation) (HCC)   Bipolar disorder, curr episode mixed, severe, with psychotic features ([place])   Hypothyroidism   Chronic kidney disease, stage III (moderate)   Leukocytosis   Diabetes mellitus type 2, controlled ([place])   Total Time spent with patient: 15 minutes  Subjective:   Meredith [name] is a 69 y.o. female seen via teleassessment. Brother Santa Lighter is at bedside. Patient continues to present disorganized and confused. She is unable to recall or answer questions appropriately. Long pauses noted with assessment questions.  Shanah is alert to self only. Additional collateral obtained by brother who was at bedside. [name] reports patient has previous inpatient admissions. For depression and anxiety while residing in [place]. Denied history to his knowledge of illicit drug use/abuse. Reports patient is followed by psychiatrist Cottle for depression and anxiety. Denies history of self injures behaviors or inpatient admissions for suicidal or homicidal ideations. Reported 3 previous episodes similar to current presentation. Brother denied family history with mental illness.  Medication adjustments was made see chart from previous clinician on 07/16/2019  . As charted : Current psychotropic medications Buspar 15 mg Bid Lamictal 25 mg Bid Seroquel XR 200 mg Q hs Patient is also given Hydralazine 10 mg Q 4 hr. prn for her blood pressure. Recommendation: Correcting delirium focus is mainly treating  underlying cause (medical condition) that is causing delirium.  Antipsychotics can be used to manage agitation of patient during delirium episode. Haloperidol low dose standard therapy.   Haloperidol (0.5 to 1 mg) be used as needed to control moderate to severe agitation or psychotic symptoms, up to a maximum dose of 5 mg per day Seroquel 25 to 50 mg Bid.   HPI: Per admission assessment: At baseline, patient is independent.  She lives alone and takes care of her own ADLs.  She drives a car.  Her brother last saw her 6 days ago.  At that time she was at baseline.  She missed 2 follow-up medical appointments this week and her provider contact her brother to check on her.  The patient had not returned phone calls.  EMS was called and the patient was found disoriented and only saying a few words repeatedly.  She was covered in stool.  Patient does have a history of psychiatric illness.  She has history of bipolar with some episodes of psychotic feature.  Also significant medical history with paroxysmal atrial fibrillation anticoagulated on Coumadin.  Prior history of episode of lithium toxicity.  Patient cannot answer any specific questions.  Intermittently she will say "okay", "yeah" and make affirmative humming sounds.  No meaningful history.  Past Psychiatric History:  Risk to Self:   Risk to Others:   Prior Inpatient Therapy:   Prior Outpatient Therapy:    Past Medical History:  Past Medical History:  Diagnosis Date  . Anxiety   . Atrial fibrillation ([place])   . Bipolar 2 disorder ([place])   . Depression   . History of cardioversion    x2  . Hyperlipidemia   . Hypothyroidism   . Migraine headache   . Osteoporosis  Past Surgical History:  Procedure Laterality Date  . ABLATION    . BACK SURGERY  2014  . CARDIOVERSION  12/2013, 01/2014   x2  . KNEE SURGERY     ORTHOSCOPIC  . LUNG REMOVAL, PARTIAL Right    BENIGN LUNG CYST  . THORACIC SPINE SURGERY Right   . THUMB ARTHROSCOPY      Family History:  Family History  Problem Relation Age of Onset  . Arthritis Mother   . Depression Mother   . Diabetes Mother   . Heart disease Mother   . Stroke Mother   . Early death Father   . Heart disease Father   . Atrial fibrillation Brother   . Hyperlipidemia Brother   . Multiple sclerosis Sister   . Alcohol abuse Brother   . Asthma Brother   . Drug abuse Brother   . Hypertension Brother   . Mental illness Brother    Family Psychiatric  History:  Social History:  Social History   Substance and Sexual Activity  Alcohol Use No     Social History   Substance and Sexual Activity  Drug Use No    Social History   Socioeconomic History  . Marital status: Single    Spouse name: Not on file  . Number of children: 3  . Years of education: Not on file  . Highest education level: Not on file  Occupational History  . Not on file  Tobacco Use  . Smoking status: Never Smoker  . Smokeless tobacco: Never Used  Substance and Sexual Activity  . Alcohol use: No  . Drug use: No  . Sexual activity: Not on file  Other Topics Concern  . Not on file  Social History Narrative  . Not on file   Social Determinants of Health   Financial Resource Strain:   . Difficulty of Paying Living Expenses:   Food Insecurity:   . Worried About Charity fundraiser in the Last Year:   . Arboriculturist in the Last Year:   Transportation Needs:   . Film/video editor (Medical):   [name] Lack of Transportation (Non-Medical):   Physical Activity:   . Days of Exercise per Week:   . Minutes of Exercise per Session:   Stress:   . Feeling of Stress :   Social Connections:   . Frequency of Communication with Friends and Family:   . Frequency of Social Gatherings with Friends and Family:   . Attends Religious Services:   . Active Member of Clubs or Organizations:   . Attends Archivist Meetings:   [name] Marital Status:    Additional Social History:    Allergies:   Allergies   Allergen Reactions  . Codeine Anaphylaxis    Patient states she can take codeine but not percocet   . Adhesive [Tape]     blister  . Celebrex [Celecoxib] Hives  . Sulfa Antibiotics Hives  . Tricyclic Antidepressants     Doesn't help  . Amoxicillin Rash  . Ampicillin Rash  . Cephalexin Rash  . Penicillins Rash    Did it involve swelling of the face/tongue/throat, SOB, or low BP? Yes Did it involve sudden or severe rash/hives, skin peeling, or any reaction on the inside of your mouth or nose? NO Did you need to seek medical attention at a hospital or doctor's office? NO When did it last happen? childhood If all above answers are "NO", may proceed with cephalosporin use.  Labs:  Results for orders placed or performed during the hospital encounter of 07/15/19 (from the past 48 hour(s))  SARS Coronavirus 2 by RT PCR (hospital order, performed in [facility] hospital lab) Nasopharyngeal Nasopharyngeal Swab     Status: None   Collection Time: 07/15/19  1:18 PM   Specimen: Nasopharyngeal Swab  Result Value Ref Range   SARS Coronavirus 2 NEGATIVE NEGATIVE    Comment: (NOTE) SARS-CoV-2 target nucleic acids are NOT DETECTED. The SARS-CoV-2 RNA is generally detectable in upper and lower respiratory specimens during the acute phase of infection. The lowest concentration of SARS-CoV-2 viral copies this assay can detect is 250 copies / mL. A negative result does not preclude SARS-CoV-2 infection and should not be used as the sole basis for treatment or other patient management decisions.  A negative result may occur with improper specimen collection / handling, submission of specimen other than nasopharyngeal swab, presence of viral mutation(s) within the areas targeted by this assay, and inadequate number of viral copies (<250 copies / mL). A negative result must be combined with clinical observations, patient history, and epidemiological information. Fact Sheet for Patients:    StrictlyIdeas.no Fact Sheet for Healthcare Providers: BankingDealers.co.za This test is not yet approved or cleared  by the [place] FDA and has been authorized for detection and/or diagnosis of SARS-CoV-2 by FDA under an Emergency Use Authorization (EUA).  This EUA will remain in effect (meaning this test can be used) for the duration of the COVID-19 declaration under Section 564(b)(1) of the Act, 21 U.S.C. section 360bbb-3(b)(1), unless the authorization is terminated or revoked sooner. Performed at [facility], [place] [address] [place], [place] [address]   Gastrointestinal Panel by PCR , Stool     Status: None   Collection Time: 07/15/19  1:46 PM   Specimen: Stool  Result Value Ref Range   Campylobacter species NOT DETECTED NOT DETECTED   Plesimonas shigelloides NOT DETECTED NOT DETECTED   Salmonella species NOT DETECTED NOT DETECTED   Yersinia enterocolitica NOT DETECTED NOT DETECTED   Vibrio species NOT DETECTED NOT DETECTED   Vibrio cholerae NOT DETECTED NOT DETECTED   Enteroaggregative E coli (EAEC) NOT DETECTED NOT DETECTED   Enteropathogenic E coli (EPEC) NOT DETECTED NOT DETECTED   Enterotoxigenic E coli (ETEC) NOT DETECTED NOT DETECTED   Shiga like toxin producing E coli (STEC) NOT DETECTED NOT DETECTED   Shigella/Enteroinvasive E coli (EIEC) NOT DETECTED NOT DETECTED   Cryptosporidium NOT DETECTED NOT DETECTED   Cyclospora cayetanensis NOT DETECTED NOT DETECTED   Entamoeba histolytica NOT DETECTED NOT DETECTED   Giardia lamblia NOT DETECTED NOT DETECTED   Adenovirus F40/41 NOT DETECTED NOT DETECTED   Astrovirus NOT DETECTED NOT DETECTED   Norovirus GI/GII NOT DETECTED NOT DETECTED   Rotavirus A NOT DETECTED NOT DETECTED   Sapovirus (I, II, IV, and V) NOT DETECTED NOT DETECTED    Comment: Performed at [facility], [place] [place], [place] [address]  C Difficile Quick Screen w PCR reflex      Status: Abnormal   Collection Time: 07/15/19  1:46 PM   Specimen: Stool  Result Value Ref Range   C Diff antigen POSITIVE (A) NEGATIVE   C Diff toxin NEGATIVE NEGATIVE   C Diff interpretation Results are indeterminate. See PCR results.     Comment: Performed at [facility], [place] [address], [place], [place] [address]  C. Diff by PCR, Reflexed     Status: None  Collection Time: 07/15/19  1:46 PM  Result Value Ref Range   Toxigenic C. Difficile by PCR NEGATIVE NEGATIVE    Comment: Patient is colonized with non toxigenic C. difficile. May not need treatment unless significant symptoms are present. Performed at [facility], [place] [address] [place], [place] [address]   Troponin I (High Sensitivity)     Status: None   Collection Time: 07/15/19  2:21 PM  Result Value Ref Range   Troponin I (High Sensitivity) 10 <18 ng/L    Comment: (NOTE) Elevated high sensitivity troponin I (hsTnI) values and significant  changes across serial measurements may suggest ACS but many other  chronic and acute conditions are known to elevate hsTnI results.  Refer to the "Links" section for chest pain algorithms and additional  guidance. Performed at [facility], [place] [address] [place], [place] [address]   CBG monitoring, ED     Status: Abnormal   Collection Time: 07/15/19  9:46 PM  Result Value Ref Range   Glucose-Capillary 100 (H) 70 - 99 mg/dL    Comment: Glucose reference range applies only to samples taken after fasting for at least 8 hours.  Glucose, capillary     Status: Abnormal   Collection Time: 07/16/19 12:05 AM  Result Value Ref Range   Glucose-Capillary 103 (H) 70 - 99 mg/dL    Comment: Glucose reference range applies only to samples taken after fasting for at least 8 hours.  Basic metabolic panel     Status: Abnormal   Collection Time: 07/16/19  6:45 AM  Result Value Ref Range   Sodium 141 135 - 145 mmol/L   Potassium 2.8 (L) 3.5 - 5.1 mmol/L    Comment: DELTA CHECK  NOTED NO VISIBLE HEMOLYSIS    Chloride 110 98 - 111 mmol/L   CO2 19 (L) 22 - 32 mmol/L   Glucose, Bld 103 (H) 70 - 99 mg/dL    Comment: Glucose reference range applies only to samples taken after fasting for at least 8 hours.   BUN 13 8 - 23 mg/dL   Creatinine, Ser 0.78 0.44 - 1.00 mg/dL   Calcium 7.7 (L) 8.9 - 10.3 mg/dL    Comment: DELTA CHECK NOTED   GFR calc non Af Amer >60 >60 mL/min   GFR calc Af Amer >60 >60 mL/min   Anion gap 12 5 - 15    Comment: Performed at [facility] [address], [place], [place] [address]  Magnesium     Status: None   Collection Time: 07/16/19  6:45 AM  Result Value Ref Range   Magnesium 1.7 1.7 - 2.4 mg/dL    Comment: Performed at [facility] [address] [place], [place] [address]  Protime-INR     Status: Abnormal   Collection Time: 07/16/19  6:45 AM  Result Value Ref Range   Prothrombin Time 15.9 (H) 11.4 - 15.2 seconds   INR 1.3 (H) 0.8 - 1.2    Comment: (NOTE) INR goal varies based on device and disease states. Performed at [facility], [place] [address] [place], [place] [address]   CBC     Status: Abnormal   Collection Time: 07/16/19  6:45 AM  Result Value Ref Range   WBC 10.7 (H) 4.0 - 10.5 K/uL   RBC 3.85 (L) 3.87 - 5.11 MIL/uL   Hemoglobin 11.6 (L) 12.0 - 15.0 g/dL   HCT 35.4 (L) 36.0 - 46.0 %   MCV  91.9 80.0 - 100.0 fL   MCH 30.1 26.0 - 34.0 pg   MCHC 32.8 30.0 - 36.0 g/dL   RDW 12.9 11.5 - 15.5 %   Platelets 209 150 - 400 K/uL   nRBC 0.0 0.0 - 0.2 %    Comment: Performed at [facility] [address] [place], [place] [address]  Hemoglobin A1c     Status: Abnormal   Collection Time: 07/16/19  6:45 AM  Result Value Ref Range   Hgb A1c MFr Bld 5.8 (H) 4.8 - 5.6 %    Comment: (NOTE) Pre diabetes:          5.7%-6.4% Diabetes:              >6.4% Glycemic control for   <7.0% adults with diabetes    Mean Plasma Glucose 119.76 mg/dL    Comment: Performed at [facility] [address], [place], [place] [address]  Glucose, capillary     Status: None   Collection Time: 07/16/19  7:53 AM  Result Value Ref Range   Glucose-Capillary 94 70 - 99 mg/dL    Comment: Glucose reference range applies only to samples taken after fasting for at least 8 hours.  Glucose, capillary     Status: None   Collection Time: 07/16/19 11:46 AM  Result Value Ref Range   Glucose-Capillary 88 70 - 99 mg/dL    Comment: Glucose reference range applies only to samples taken after fasting for at least 8 hours.  Glucose, capillary     Status: None   Collection Time: 07/16/19  4:18 PM  Result Value Ref Range   Glucose-Capillary 85 70 - 99 mg/dL    Comment: Glucose reference range applies only to samples taken after fasting for at least 8 hours.  Glucose, capillary     Status: None   Collection Time: 07/16/19 10:49 PM  Result Value Ref Range   Glucose-Capillary 89 70 - 99 mg/dL    Comment: Glucose reference range applies only to samples taken after fasting for at least 8 hours.  Protime-INR     Status: Abnormal   Collection Time: 07/17/19  4:37 AM  Result Value Ref Range   Prothrombin Time 15.7 (H) 11.4 - 15.2 seconds   INR 1.3 (H) 0.8 - 1.2    Comment: (NOTE) INR goal varies based on device and disease states. Performed at [facility], [place] [address], [place] [address]   CBC     Status: None   Collection Time: 07/17/19  4:37 AM  Result Value Ref Range   WBC 9.8 4.0 - 10.5 K/uL   RBC 4.29 3.87 - 5.11 MIL/uL   Hemoglobin 12.8 12.0 - 15.0 g/dL   HCT 39.2 36.0 - 46.0 %   MCV 91.4 80.0 - 100.0 fL   MCH 29.8 26.0 - 34.0 pg   MCHC 32.7 30.0 - 36.0 g/dL   RDW 12.9 11.5 - 15.5 %   Platelets 244 150 - 400 K/uL   nRBC 0.0 0.0 - 0.2 %    Comment: Performed at [facility], [place] [address], [place], [place] [address]  Glucose, capillary     Status: Abnormal   Collection Time: 07/17/19  6:49 AM  Result Value Ref Range   Glucose-Capillary 127 (H) 70 - 99 mg/dL    Comment: Glucose  reference range applies only to samples taken after fasting for at least 8 hours.  Glucose, capillary  Status: Abnormal   Collection Time: 07/17/19 10:59 AM  Result Value Ref Range   Glucose-Capillary 110 (H) 70 - 99 mg/dL    Comment: Glucose reference range applies only to samples taken after fasting for at least 8 hours.  Renal function panel     Status: Abnormal   Collection Time: 07/17/19 11:25 AM  Result Value Ref Range   Sodium 142 135 - 145 mmol/L   Potassium 3.7 3.5 - 5.1 mmol/L   Chloride 113 (H) 98 - 111 mmol/L   CO2 21 (L) 22 - 32 mmol/L   Glucose, Bld 119 (H) 70 - 99 mg/dL    Comment: Glucose reference range applies only to samples taken after fasting for at least 8 hours.   BUN 16 8 - 23 mg/dL   Creatinine, Ser 0.82 0.44 - 1.00 mg/dL   Calcium 8.3 (L) 8.9 - 10.3 mg/dL   Phosphorus 1.9 (L) 2.5 - 4.6 mg/dL   Albumin 3.4 (L) 3.5 - 5.0 g/dL   GFR calc non Af Amer >60 >60 mL/min   GFR calc Af Amer >60 >60 mL/min   Anion gap 8 5 - 15    Comment: Performed at [facility] [address], [place], [place] [address]  Magnesium     Status: Abnormal   Collection Time: 07/17/19 11:25 AM  Result Value Ref Range   Magnesium 2.5 (H) 1.7 - 2.4 mg/dL    Comment: Performed at [facility] [address], [place], [place] [address]    Medications:  Current Facility-Administered Medications  Medication Dose Route Frequency Provider Last Rate Last Admin  . albuterol (PROVENTIL) (2.5 MG/3ML) 0.083% nebulizer solution 2.5 mg  2.5 mg Nebulization Q6H PRN Smith, Rondell A, MD      . busPIRone (BUSPAR) tablet 15 mg  15 mg Oral BID Nita Sells, MD      . enoxaparin (LOVENOX) injection 80 mg  80 mg Subcutaneous Q12H Pierce, Dwayne A, RPH   80 mg at 07/17/19 1119  . haloperidol lactate (HALDOL) injection 1 mg  1 mg Intravenous Q6H PRN Nita Sells, MD      . hydrALAZINE (APRESOLINE) injection 10 mg  10 mg Intravenous Q4H PRN Smith, Rondell A, MD      . insulin  aspart (novoLOG) injection 0-9 Units  0-9 Units Subcutaneous TID WC Fuller Plan A, MD   1 Units at 07/17/19 0834  . lamoTRIgine (LAMICTAL) tablet 25 mg  25 mg Oral BID Nita Sells, MD      . levothyroxine (SYNTHROID) tablet 100 mcg  100 mcg Oral QAC breakfast Tamala Julian, Rondell A, MD      . lithium carbonate capsule 150 mg  150 mg Oral Daily Smith, Rondell A, MD      . lithium carbonate capsule 300 mg  300 mg Oral QHS Smith, Rondell A, MD      . loperamide (IMODIUM) capsule 2 mg  2 mg Oral PRN Mercy Riding, MD      . loratadine (CLARITIN) tablet 10 mg  10 mg Oral QPM Smith, Rondell A, MD      . metoprolol succinate (TOPROL-XL) 24 hr tablet 50 mg  50 mg Oral Daily Smith, Rondell A, MD      . metoprolol tartrate (LOPRESSOR) injection 5 mg  5 mg Intravenous Q2H PRN Opyd, Ilene Qua, MD      . ondansetron (ZOFRAN) injection 4 mg  4 mg Intravenous Q6H PRN Norval Morton, MD   4 mg  at 07/16/19 0508  . pregabalin (LYRICA) capsule 25 mg  25 mg Oral TID Nita Sells, MD      . QUEtiapine (SEROQUEL XR) 24 hr tablet 200 mg  200 mg Oral QHS [name], Jai-Gurmukh, MD      . sodium chloride flush (NS) 0.9 % injection 3 mL  3 mL Intravenous Q12H Smith, Rondell A, MD      . thiamine (B-1) 250 mg in sodium chloride 0.9 % 50 mL IVPB  250 mg Intravenous Daily Gonfa, Taye T, MD 100 mL/hr at 07/17/19 1315 250 mg at 07/17/19 1315  . verapamil (CALAN-SR) CR tablet 120 mg  120 mg Oral QHS Norval Morton, MD        Musculoskeletal: Strength & Muscle Tone: within normal limits Gait & Station: N/A Patient leans: N/A  Psychiatric Specialty Exam: Physical Exam  Vitals reviewed. Constitutional: She appears well-developed.  Cardiovascular: Normal rate.  Psychiatric: She has a normal mood and affect. Her behavior is normal.    Review of Systems  Psychiatric/Behavioral: Positive for confusion. The patient is nervous/anxious.   All other systems reviewed and are negative.   Blood pressure (!)  148/66, pulse 70, temperature 97.8 F (36.6 C), temperature source Oral, resp. rate 17, height 5' (1.524 m), weight 77.1 kg, SpO2 96 %.Body mass index is 33.2 kg/m.  General Appearance: Casual  Eye Contact:  Good  Speech:  Blocked  Volume:  Normal  Mood:  Anxious  Affect:  Appropriate  Thought Process:  Disorganized  Orientation:  Other:  self only  Thought Content:  NA  Suicidal Thoughts:  Homicidal Thoughts:    Memory:  Immediate;   Poor Recent;   Poor  Judgement:  NA  Insight:  Lacking  Psychomotor Activity:  NA  Concentration:  Concentration: Poor  Recall:  Poor  Fund of Knowledge:  Fair  Language:  Fair  Akathisia:  No  Handed:  Right  AIMS (if indicated):     Assets:  Communication Skills Desire for Improvement Resilience Social Support  ADL's:  Intact  Cognition:    Sleep:        Treatment Plan Summary: Daily contact with patient to assess and evaluate symptoms and progress in treatment and Medication management   As charted on 07/16/2019 Current psycohotropic medications Buspar 15 mg Bid Lamictal 25 mg Bid Seroquel XR 200 mg Q hs Patient is also given Hydralazine 10 mg Q 4 hr. prn for her blood pressure. Recommendation: Correcting delirium focus is mainly treating underlying cause (medical condition) that is causing delirium.  Antipsychotics can be used to manage agitation of patient during delirium episode. Haloperidol low dose standard therapy.   Haloperidol (0.5 to 1 mg) be used as needed to control moderate to severe agitation or psychotic symptoms, up to a maximum dose of 5 mg per day Seroquel 25 to 50 mg Bid.  Disposition: No evidence of imminent risk to self or others at present.    -Consider reconsulting after medical clearance and medication management    This service was provided via telemedicine using a 2-way, interactive audio and video technology.  Names of all persons participating in this telemedicine service and their role in this  encounter. Name: Davyn Schwaller Role: patient  Name: [name] Role: Brother ( twin)   Name: T.Junko Ohagan Role: NP        Derrill [place], NP 07/17/2019 1:17 PM

## 2019-07-18 DIAGNOSIS — R5381 Other malaise: Secondary | ICD-10-CM

## 2019-07-18 LAB — GLUCOSE, CAPILLARY
Glucose-Capillary: 96 mg/dL (ref 70–99)
Glucose-Capillary: 107 mg/dL — ABNORMAL HIGH (ref 70–99)
Glucose-Capillary: 93 mg/dL (ref 70–99)
Glucose-Capillary: 91 mg/dL (ref 70–99)

## 2019-07-18 LAB — RENAL FUNCTION PANEL
Albumin: 3.3 g/dL — ABNORMAL LOW (ref 3.5–5.0)
Anion gap: 9 (ref 5–15)
BUN: 12 mg/dL (ref 8–23)
CO2: 22 mmol/L (ref 22–32)
Calcium: 8.1 mg/dL — ABNORMAL LOW (ref 8.9–10.3)
Chloride: 107 mmol/L (ref 98–111)
Creatinine, Ser: 0.8 mg/dL (ref 0.44–1.00)
GFR calc Af Amer: >60 mL/min (ref >60)
GFR calc non Af Amer: >60 mL/min (ref >60)
Glucose, Bld: 102 mg/dL — ABNORMAL HIGH (ref 70–99)
Phosphorus: 2.7 mg/dL (ref 2.5–4.6)
Potassium: 2.8 mmol/L — ABNORMAL LOW (ref 3.5–5.1)
Sodium: 138 mmol/L (ref 135–145)

## 2019-07-18 LAB — CBC
HCT: 37.1 % (ref 36.0–46.0)
Hemoglobin: 12.6 g/dL (ref 12.0–15.0)
MCH: 30.6 pg (ref 26.0–34.0)
MCHC: 34 g/dL (ref 30.0–36.0)
MCV: 90 fL (ref 80.0–100.0)
Platelets: 232 10*3/uL (ref 150–400)
RBC: 4.12 MIL/uL (ref 3.87–5.11)
RDW: 12.5 % (ref 11.5–15.5)
WBC: 8.4 10*3/uL (ref 4.0–10.5)
nRBC: 0 % (ref 0.0–0.2)

## 2019-07-18 LAB — RPR: RPR Ser Ql: NONREACTIVE

## 2019-07-18 LAB — PROTIME-INR
INR: 1.3 — ABNORMAL HIGH (ref 0.8–1.2)
Prothrombin Time: 15.6 seconds — ABNORMAL HIGH (ref 11.4–15.2)

## 2019-07-18 LAB — MAGNESIUM: Magnesium: 2.2 mg/dL (ref 1.7–2.4)

## 2019-07-18 LAB — CORTISOL-AM, BLOOD: Cortisol - AM: 31.3 ug/dL — ABNORMAL HIGH (ref 6.7–22.6)

## 2019-07-18 MED ORDER — POTASSIUM CHLORIDE CRYS ER 20 MEQ PO TBCR
40.0000 meq | EXTENDED_RELEASE_TABLET | ORAL | Status: AC
  Administered 2019-07-18 (×3): 40 meq via ORAL
  Filled 2019-07-18: qty 2
  Filled 2019-07-18: qty 4
  Filled 2019-07-18: qty 2

## 2019-07-18 MED ORDER — QUETIAPINE FUMARATE 50 MG PO TABS
50.0000 mg | ORAL_TABLET | Freq: Two times a day (BID) | ORAL | Status: DC
  Administered 2019-07-18 – 2019-07-20 (×5): 50 mg via ORAL
  Filled 2019-07-18 (×5): qty 1

## 2019-07-18 MED ORDER — BUSPIRONE HCL 5 MG PO TABS
10.0000 mg | ORAL_TABLET | Freq: Two times a day (BID) | ORAL | Status: DC
  Administered 2019-07-18 – 2019-07-20 (×4): 10 mg via ORAL
  Filled 2019-07-18 (×4): qty 2

## 2019-07-18 NOTE — Progress Notes (Signed)
PROGRESS NOTE  Meredith [name] [id number] DOB: November 17, 1950   PCP: Kristen Loader, FNP  Patient is from: Home.  At baseline able to complete her ADLs without assistance.  DOA: 07/15/2019 LOS: 3  Brief Narrative / Interim history: 69 year old female with history of bipolar disorder, A. fib on Coumadin, AAA, HTN, HLD and hypothyroidism brought to ED by EMS due to acute altered mental status.  She was found covered in stool when EMS arrived.  In ED, afebrile.  BP elevated.  Other vital signs maintained.  WBC 11.2. Cr 1.05.  Lithium level 0.22. AG 17.  CT head and CXR negative for acute finding.  UDS positive for opiates.  UA without signs of infection.  Patient was started on oral vancomycin.  Received a liter of normal saline bolus, and admitted for possible colitis. C. difficile screen positive for antigen but negative for toxin.  PCR negative.  GIP negative.  Vancomycin and Flagyl discontinued.  Psychiatry consulted for encephalopathy, and recommended treating underlying cause of delirium, as needed Haldol for agitation and p.o. Seroquel 25 to 50 mg twice daily.   Subjective: No major events overnight of this morning.  She took her medications this morning. Denies pain, shortness of breath, GI or UTI symptoms.  However, she was only oriented to self and place, and somewhat sleepy was she talks to me.  She says she feels tired.  Objective: Vitals:   07/17/19 0800 07/17/19 1638 07/17/19 2212 07/18/19 0904  BP: (!) 148/66 (!) 146/75 139/74 124/68  Pulse: 70 (!) 54 79 (!) 58  Resp: 17 16 18 18   Temp: 97.8 F (36.6 C) 98.7 F (37.1 C) 98 F (36.7 C) 97.7 F (36.5 C)  TempSrc: Oral Oral    SpO2: 96% 98% 98% 98%  Weight:      Height:        Intake/Output Summary (Last 24 hours) at 07/18/2019 1042 Last data filed at 07/18/2019 0900 Gross per 24 hour  Intake 581.29 ml  Output 450 ml  Net 131.29 ml   Filed Weights   07/16/19 1145  Weight: 77.1 kg    Examination:  GENERAL: No  apparent distress.  Nontoxic. HEENT: MMM.  Vision and hearing grossly intact.  NECK: Supple.  No apparent JVD.  RESP: 98% on RA.  No IWOB.  Fair aeration bilaterally. CVS:  RRR. Heart sounds normal.  ABD/GI/GU: BS+. Abd soft, NTND.  MSK/EXT:  Moves extremities. No apparent deformity. No edema.  SKIN: no apparent skin lesion or wound NEURO: Sleepy but wakes to voice.  Oriented to self and place.  Follows commands.  No apparent focal neuro deficit. PSYCH: Calm.  Sleepy.  Looks tired.  Procedures:  None  Microbiology summarized: [id number] negative. C. difficile antigen positive but toxin and PCR negative. GIP negative.  Assessment & Plan: Acute metabolic encephalopathy: POA.  Unclear etiology yet. On multiple medications which could potentially contribute.  CT head, TFT, RPR, B12 and ammonia unrevealing.  No focal neuro deficit.  Sleepy and tired.  Oriented to self and place today. -Check a.m. cortisol -Follow B1 level -Empiric high-dose thiamine -MRI?  Not sure if she could cooperate. -Appreciate recs by psych  Colitis?  POA.  It seems she is colonized by C. Difficile but doubt active infection.  GIP and C. difficile toxin and PCR are negative.  Abdominal exam is benign.  No significant leukocytosis or fever.  Diarrhea could be due to Metformin. -Discontinue vancomycin and Flagyl and monitor. -As needed Imodium -CT abdomen and  pelvis if further diarrhea  Paroxysmal A. Fib: Rate controlled.  On metoprolol, verapamil and warfarin at home.  Subtherapeutic INR on admission.  Followed by Dr. Katharina Caper.  Had ablation in the past. -On verapamil, metoprolol and Lovenox here. -I will consider DOAC if she needs to be on anticoagulation going forward.  CHA2DS2-VASc score 4.  AKI: POA-resolved.  Unlikely CKD.  Anxiety/bipolar disorder: On high-dose BuSpar, Lamictal, lithium and Seroquel at home.  Followed by Dr. Clovis Pu.  Lithium level low. -Appreciate psych input-as needed Haldol and Seroquel 25  to 50 mg twice daily -Discontinued lithium and BuSpar.  Decreased Lamictal  Chronic pain: Followed by [name] outpatient.  Seen baclofen on her medication list. -Scheduled Tylenol here   Controlled DM-2: A1c 5.8%.  On Metformin at home. Recent Labs    07/17/19 1639 07/17/19 2211 07/18/19 0655  GLUCAP 89 108* 96  -Continue SSI and CBG monitoring   Mild leukocytosis: Resolved.   Hypothyroidism: TFTs slightly low.  Free T4 not reliable while she is on Synthroid. -Continue home Synthroid and recheck TSH in 4 to 6 weeks  At risk for polypharmacy -The minimum the better.  Will fine-tune her home medications  Hypokalemia: K2.8.  Mg 2.2. -Replenish and recheck  AAA -Outpatient follow-up  History of migraine: Stable  Debility -PT/OT eval         DVT prophylaxis: On therapeutic Lovenox for A. fib Code Status: Full code Family Communication: Patient and/or RN. Available if any question.  Status is: Inpatient  Remains inpatient appropriate because:Persistent severe electrolyte disturbances, Altered mental status, IV treatments appropriate due to intensity of illness or inability to take PO and Inpatient level of care appropriate due to severity of illness   Dispo: The patient is from: Home              Anticipated d/c is to: SNF              Anticipated d/c date is: 2 days              Patient currently is not medically stable to d/c.        Consultants:  Psychiatry   Sch Meds:  Scheduled Meds: . busPIRone  10 mg Oral BID  . enoxaparin (LOVENOX) injection  80 mg Subcutaneous Q12H  . insulin aspart  0-9 Units Subcutaneous TID WC  . lamoTRIgine  25 mg Oral BID  . levothyroxine  100 mcg Oral QAC breakfast  . loratadine  10 mg Oral QPM  . metoprolol succinate  50 mg Oral Daily  . potassium chloride  40 mEq Oral Q4H  . pregabalin  25 mg Oral TID  . QUEtiapine  50 mg Oral BID  . sodium chloride flush  3 mL Intravenous Q12H  . verapamil  120 mg Oral QHS     Continuous Infusions: . thiamine injection 250 mg (07/18/19 0849)   PRN Meds:.albuterol, haloperidol lactate, hydrALAZINE, loperamide, metoprolol tartrate, [DISCONTINUED] ondansetron **OR** ondansetron (ZOFRAN) IV  Antimicrobials: Anti-infectives (From admission, onward)   Start     Dose/Rate Route Frequency Ordered Stop   07/16/19 1045  vancomycin (VANCOCIN) 50 mg/mL oral solution 125 mg  Status:  Discontinued     125 mg Oral 4 times daily 07/16/19 1032 07/17/19 1140   07/15/19 1930  metroNIDAZOLE (FLAGYL) IVPB 500 mg  Status:  Discontinued     500 mg 100 mL/hr over 60 Minutes Intravenous Every 8 hours 07/15/19 1916 07/17/19 1140   07/15/19 1930  ciprofloxacin (CIPRO) IVPB 400  mg  Status:  Discontinued     400 mg 200 mL/hr over 60 Minutes Intravenous Every 12 hours 07/15/19 1916 07/16/19 0924   07/15/19 1500  vancomycin (VANCOCIN) 50 mg/mL oral solution 125 mg  Status:  Discontinued     125 mg Oral 4 times daily 07/15/19 1456 07/15/19 2105       I have personally reviewed the following labs and images: CBC: Recent Labs  Lab 07/15/19 1150 07/16/19 0645 07/17/19 0437 07/18/19 0323  WBC 11.3* 10.7* 9.8 8.4  NEUTROABS 9.1*  --   --   --   HGB 13.0 11.6* 12.8 12.6  HCT 39.0 35.4* 39.2 37.1  MCV 91.5 91.9 91.4 90.0  PLT 245 209 244 232   BMP &GFR Recent Labs  Lab 07/15/19 1150 07/16/19 0645 07/17/19 1125 07/18/19 0323  NA 142 141 142 138  K 3.5 2.8* 3.7 2.8*  CL 105 110 113* 107  CO2 20* 19* 21* 22  GLUCOSE 131* 103* 119* 102*  BUN 20 13 16 12   CREATININE 1.05* 0.78 0.82 0.80  CALCIUM 9.9 7.7* 8.3* 8.1*  MG 1.9 1.7 2.5* 2.2  PHOS 2.4*  --  1.9* 2.7   Estimated Creatinine Clearance: 60.9 mL/min (by C-G formula based on SCr of 0.8 mg/dL). Liver & Pancreas: Recent Labs  Lab 07/15/19 1150 07/17/19 1125 07/18/19 0323  AST 20  --   --   ALT 14  --   --   ALKPHOS 43  --   --   BILITOT 0.9  --   --   PROT 6.9  --   --   ALBUMIN 4.3 3.4* 3.3*   No results  for input(s): LIPASE, AMYLASE in the last 168 hours. Recent Labs  Lab 07/15/19 1316  AMMONIA <9*   Diabetic: Recent Labs    07/16/19 0645  HGBA1C 5.8*   Recent Labs  Lab 07/17/19 0649 07/17/19 1059 07/17/19 1639 07/17/19 2211 07/18/19 0655  GLUCAP 127* 110* 89 108* 96   Cardiac Enzymes: No results for input(s): CKTOTAL, CKMB, CKMBINDEX, TROPONINI in the last 168 hours. No results for input(s): PROBNP in the last 8760 hours. Coagulation Profile: Recent Labs  Lab 07/15/19 1150 07/16/19 0645 07/17/19 0437 07/18/19 0323  INR 1.3* 1.3* 1.3* 1.3*   Thyroid Function Tests: Recent Labs    07/15/19 1316  TSH 0.341*   Lipid Profile: No results for input(s): CHOL, HDL, LDLCALC, TRIG, CHOLHDL, LDLDIRECT in the last 72 hours. Anemia Panel: Recent Labs    07/17/19 1248  VITAMINB12 284   Urine analysis:    Component Value Date/Time   COLORURINE YELLOW 07/15/2019 1222   APPEARANCEUR CLEAR 07/15/2019 1222   LABSPEC 1.026 07/15/2019 1222   PHURINE 6.0 07/15/2019 1222   GLUCOSEU NEGATIVE 07/15/2019 1222   HGBUR NEGATIVE 07/15/2019 [place] 07/15/2019 1222   KETONESUR 20 (A) 07/15/2019 1222   PROTEINUR 30 (A) 07/15/2019 1222   NITRITE NEGATIVE 07/15/2019 1222   LEUKOCYTESUR NEGATIVE 07/15/2019 1222   Sepsis Labs: Invalid input(s): PROCALCITONIN, [place]  Microbiology: Recent Results (from the past 240 hour(s))  SARS Coronavirus 2 by RT PCR (hospital order, performed in [facility] hospital lab) Nasopharyngeal Nasopharyngeal Swab     Status: None   Collection Time: 07/15/19  1:18 PM   Specimen: Nasopharyngeal Swab  Result Value Ref Range Status   SARS Coronavirus 2 NEGATIVE NEGATIVE Final    Comment: (NOTE) SARS-CoV-2 target nucleic acids are NOT DETECTED. The SARS-CoV-2 RNA is generally detectable in upper  and lower respiratory specimens during the acute phase of infection. The lowest concentration of SARS-CoV-2 viral copies this assay  can detect is 250 copies / mL. A negative result does not preclude SARS-CoV-2 infection and should not be used as the sole basis for treatment or other patient management decisions.  A negative result may occur with improper specimen collection / handling, submission of specimen other than nasopharyngeal swab, presence of viral mutation(s) within the areas targeted by this assay, and inadequate number of viral copies (<250 copies / mL). A negative result must be combined with clinical observations, patient history, and epidemiological information. Fact Sheet for Patients:   StrictlyIdeas.no Fact Sheet for Healthcare Providers: BankingDealers.co.za This test is not yet approved or cleared  by the [place] FDA and has been authorized for detection and/or diagnosis of SARS-CoV-2 by FDA under an Emergency Use Authorization (EUA).  This EUA will remain in effect (meaning this test can be used) for the duration of the COVID-19 declaration under Section 564(b)(1) of the Act, 21 U.S.C. section 360bbb-3(b)(1), unless the authorization is terminated or revoked sooner. Performed at [facility], [place] [address] [place], [place] [address]   Gastrointestinal Panel by PCR , Stool     Status: None   Collection Time: 07/15/19  1:46 PM   Specimen: Stool  Result Value Ref Range Status   Campylobacter species NOT DETECTED NOT DETECTED Final   Plesimonas shigelloides NOT DETECTED NOT DETECTED Final   Salmonella species NOT DETECTED NOT DETECTED Final   Yersinia enterocolitica NOT DETECTED NOT DETECTED Final   Vibrio species NOT DETECTED NOT DETECTED Final   Vibrio cholerae NOT DETECTED NOT DETECTED Final   Enteroaggregative E coli (EAEC) NOT DETECTED NOT DETECTED Final   Enteropathogenic E coli (EPEC) NOT DETECTED NOT DETECTED Final   Enterotoxigenic E coli (ETEC) NOT DETECTED NOT DETECTED Final   Shiga like toxin producing E coli (STEC) NOT  DETECTED NOT DETECTED Final   Shigella/Enteroinvasive E coli (EIEC) NOT DETECTED NOT DETECTED Final   Cryptosporidium NOT DETECTED NOT DETECTED Final   Cyclospora cayetanensis NOT DETECTED NOT DETECTED Final   Entamoeba histolytica NOT DETECTED NOT DETECTED Final   Giardia lamblia NOT DETECTED NOT DETECTED Final   Adenovirus F40/41 NOT DETECTED NOT DETECTED Final   Astrovirus NOT DETECTED NOT DETECTED Final   Norovirus GI/GII NOT DETECTED NOT DETECTED Final   Rotavirus A NOT DETECTED NOT DETECTED Final   Sapovirus (I, II, IV, and V) NOT DETECTED NOT DETECTED Final    Comment: Performed at [facility], [place] [place], [place] [address]  C Difficile Quick Screen w PCR reflex     Status: Abnormal   Collection Time: 07/15/19  1:46 PM   Specimen: Stool  Result Value Ref Range Status   C Diff antigen POSITIVE (A) NEGATIVE Final   C Diff toxin NEGATIVE NEGATIVE Final   C Diff interpretation Results are indeterminate. See PCR results.  Final    Comment: Performed at [facility], [place] [address], [place], [place] [address]  C. Diff by PCR, Reflexed     Status: None   Collection Time: 07/15/19  1:46 PM  Result Value Ref Range Status   Toxigenic C. Difficile by PCR NEGATIVE NEGATIVE Final    Comment: Patient is colonized with non toxigenic C. difficile. May not need treatment unless significant symptoms are present. Performed at [facility], [place] [address], [place], [place] [address]     Radiology Studies:  No results found.    Evonna Stoltz T. [place]  If 7PM-7AM, please contact night-coverage [email or web address] Password [facility] 07/18/2019, 10:42 AM

## 2019-07-18 NOTE — Evaluation (Signed)
Occupational Therapy Evaluation Patient Details Name: Meredith [name] MRN: [id number] DOB: 1951-01-06 Today's Date: 07/18/2019    History of Present Illness 69 year old female with history of bipolar disorder, HLD, anxiety  who presented with altered mental status. MRI brain: negative. EEG to evaluate for seizures: suggestive of cortical dysfunction in the left temporal region as well as mild diffuse encephalopathy. No seizures or epileptiform   Clinical Impression   This 69 y/o female presents with the above. PTA pt reports independence-mod independent with ADL and mobility using SPC in the community. Pt presenting supine in bed, pleasant and willing to work with therapy today. She currently requires overall minA for functional transfers using RW, tolerating stand pivot to recliner this session. Pt able to perform LB ADL with up to modA, requiring setup assist for seated grooming/UB ADL and given increased time/effort for task completion as well as increased time for processing/carrying out instruction. Max HR noted 131 with standing activity. Pt lives alone and reports her brother lives locally and checks in PRN. She will benefit from continued acute OT services and currently recommend follow up therapy services in SNF setting to maximize her overall safety and independence with ADL and mobility. Will follow.     Follow Up Recommendations  SNF;Supervision/Assistance - 24 hour(pending progress)    Equipment Recommendations  3 in 1 bedside commode;Other (comment)(TBD in next venue)           Precautions / Restrictions Precautions Precautions: Fall Restrictions Weight Bearing Restrictions: No      Mobility Bed Mobility Overal bed mobility: Needs Assistance Bed Mobility: Supine to Sit     Supine to sit: Min assist     General bed mobility comments: for trunk elevation, pt seeking HHA, cues to scoot to EOB  Transfers Overall transfer level: Needs assistance Equipment used:  Rolling walker (2 wheeled) Transfers: Sit to/from Stand Sit to Stand: Min assist;Mod assist         General transfer comment: boosting and steadying assist initially at RW, pt able to take few steps to recliner with steadying assist throughout, VCs for safe hand placement initially with pt demonstrating good carryover with return to sitting     Balance Overall balance assessment: Needs assistance;History of Falls Sitting-balance support: Feet supported Sitting balance-Leahy Scale: Good     Standing balance support: Bilateral upper extremity supported Standing balance-Leahy Scale: Poor Standing balance comment: reliant on UE support/external assist                            ADL either performed or assessed with clinical judgement   ADL Overall ADL's : Needs assistance/impaired Eating/Feeding: Sitting;Modified independent Eating/Feeding Details (indicate cue type and reason): setup with lunch tray end of session Grooming: Brushing hair;Set up;Sitting Grooming Details (indicate cue type and reason): seated in recliner Upper Body Bathing: Set up;Supervision/ safety;Sitting   Lower Body Bathing: Moderate assistance;Sit to/from stand   Upper Body Dressing : Set up;Supervision/safety;Sitting   Lower Body Dressing: Moderate assistance;Sit to/from stand Lower Body Dressing Details (indicate cue type and reason): pt is able to don her socks via figure 4 while seated EOB, requiring assist to place LEs and support to maintain LE in position while performing task  Toilet Transfer: Minimal assistance;Stand-pivot;RW Toilet Transfer Details (indicate cue type and reason): simulated via transfer to [place] and Hygiene: Moderate assistance;Sit to/from stand       Functional mobility during ADLs: Minimal assistance;Rolling walker(stand pivot transfer)  General ADL Comments: pt requiring increased time/effort for all ADL/mobility tasks today                   Pertinent Vitals/Pain Pain Assessment: No/denies pain     Hand Dominance Right   Extremity/Trunk Assessment Upper Extremity Assessment Upper Extremity Assessment: Generalized weakness   Lower Extremity Assessment Lower Extremity Assessment: Defer to PT evaluation       Communication Communication Communication: Expressive difficulties(mild slurred speech)   Cognition Arousal/Alertness: Awake/alert Behavior During Therapy: WFL for tasks assessed/performed Overall Cognitive Status: No family/caregiver present to determine baseline cognitive functioning                                 General Comments: pt slow to respond to questions and to initiate tasks, overall following commands well, A&Ox4 (disoriented to specific day/date)   General Comments  HR briefly up to 131 with standing activity, returned to 107 bpm with seated rest     Exercises     Shoulder Instructions      Home Living Family/patient expects to be discharged to:: Private residence Living Arrangements: Alone Available Help at Discharge: Family;Available PRN/intermittently Type of Home: Apartment Home Access: Level entry     Home Layout: One level     Bathroom Shower/Tub: Teacher, early years/pre: Standard     Home Equipment: Grab bars - tub/shower;Cane - single point          Prior Functioning/Environment Level of Independence: Independent with assistive device(s)        Comments: using SPC in community, driving        OT Problem List: Decreased strength;Decreased range of motion;Decreased activity tolerance;Impaired balance (sitting and/or standing);Decreased knowledge of use of DME or AE;Decreased cognition      OT Treatment/Interventions: Self-care/ADL training;Therapeutic exercise;Energy conservation;DME and/or AE instruction;Therapeutic activities;Cognitive remediation/compensation;Patient/family education;Balance training    OT Goals(Current  goals can be found in the care plan section) Acute Rehab OT Goals Patient Stated Goal: regain her independence OT Goal Formulation: With patient Time For Goal Achievement: 08/01/19 Potential to Achieve Goals: Good  OT Frequency: Min 2X/week   Barriers to D/C:            Co-evaluation              AM-PAC OT "6 Clicks" Daily Activity     Outcome Measure Help from another person eating meals?: None Help from another person taking care of personal grooming?: A Little Help from another person toileting, which includes using toliet, bedpan, or urinal?: A Lot Help from another person bathing (including washing, rinsing, drying)?: A Lot Help from another person to put on and taking off regular upper body clothing?: A Little Help from another person to put on and taking off regular lower body clothing?: A Lot 6 Click Score: 16   End of Session Equipment Utilized During Treatment: Gait belt;Rolling walker Nurse Communication: Mobility status  Activity Tolerance: Patient tolerated treatment well Patient left: in chair;with call bell/phone within reach;with chair alarm set  OT Visit Diagnosis: Muscle weakness (generalized) (M62.81);Other abnormalities of gait and mobility (R26.89);Other symptoms and signs involving cognitive function                Time: 1129-1155 OT Time Calculation (min): 26 min Charges:  OT General Charges $OT Visit: 1 Visit OT Evaluation $OT Eval Moderate Complexity: 1 Mod OT Treatments $Self Care/Home Management : 8-22 mins  Lou Cal, OT Acute Rehabilitation Services Pager [phone] Office [phone]   Raymondo Band 07/18/2019, 2:30 PM

## 2019-07-19 LAB — RENAL FUNCTION PANEL
Albumin: 3.2 g/dL — ABNORMAL LOW (ref 3.5–5.0)
Anion gap: 6 (ref 5–15)
BUN: 15 mg/dL (ref 8–23)
CO2: 23 mmol/L (ref 22–32)
Calcium: 8.5 mg/dL — ABNORMAL LOW (ref 8.9–10.3)
Chloride: 111 mmol/L (ref 98–111)
Creatinine, Ser: 0.85 mg/dL (ref 0.44–1.00)
GFR calc Af Amer: >60 mL/min (ref >60)
GFR calc non Af Amer: >60 mL/min (ref >60)
Glucose, Bld: 100 mg/dL — ABNORMAL HIGH (ref 70–99)
Phosphorus: 2.2 mg/dL — ABNORMAL LOW (ref 2.5–4.6)
Potassium: 3.7 mmol/L (ref 3.5–5.1)
Sodium: 140 mmol/L (ref 135–145)

## 2019-07-19 LAB — CBC
HCT: 36.6 % (ref 36.0–46.0)
Hemoglobin: 12.3 g/dL (ref 12.0–15.0)
MCH: 30.6 pg (ref 26.0–34.0)
MCHC: 33.6 g/dL (ref 30.0–36.0)
MCV: 91 fL (ref 80.0–100.0)
Platelets: 221 10*3/uL (ref 150–400)
RBC: 4.02 MIL/uL (ref 3.87–5.11)
RDW: 12.8 % (ref 11.5–15.5)
WBC: 7.9 10*3/uL (ref 4.0–10.5)
nRBC: 0 % (ref 0.0–0.2)

## 2019-07-19 LAB — GLUCOSE, CAPILLARY
Glucose-Capillary: 84 mg/dL (ref 70–99)
Glucose-Capillary: 83 mg/dL (ref 70–99)
Glucose-Capillary: 85 mg/dL (ref 70–99)
Glucose-Capillary: 83 mg/dL (ref 70–99)

## 2019-07-19 LAB — PROTIME-INR
INR: 1.2 (ref 0.8–1.2)
Prothrombin Time: 14.4 seconds (ref 11.4–15.2)

## 2019-07-19 LAB — MAGNESIUM: Magnesium: 2.3 mg/dL (ref 1.7–2.4)

## 2019-07-19 LAB — CORTISOL-AM, BLOOD: Cortisol - AM: 23.1 ug/dL — ABNORMAL HIGH (ref 6.7–22.6)

## 2019-07-19 MED ORDER — LITHIUM CARBONATE 300 MG PO CAPS
300.0000 mg | ORAL_CAPSULE | Freq: Every day | ORAL | Status: DC
  Administered 2019-07-19: 300 mg via ORAL
  Filled 2019-07-19 (×2): qty 1

## 2019-07-19 MED ORDER — WARFARIN SODIUM 4 MG PO TABS
4.0000 mg | ORAL_TABLET | Freq: Once | ORAL | Status: DC
  Filled 2019-07-19: qty 1

## 2019-07-19 MED ORDER — WARFARIN - PHARMACIST DOSING INPATIENT: Freq: Every day | Status: DC

## 2019-07-19 MED ORDER — POTASSIUM PHOSPHATES 15 MMOLE/5ML IV SOLN
30.0000 mmol | Freq: Once | INTRAVENOUS | Status: AC
  Administered 2019-07-19: 30 mmol via INTRAVENOUS
  Filled 2019-07-19: qty 10

## 2019-07-19 MED ORDER — LAMOTRIGINE 100 MG PO TABS
100.0000 mg | ORAL_TABLET | Freq: Every day | ORAL | Status: DC
  Administered 2019-07-20: 100 mg via ORAL
  Filled 2019-07-19: qty 1

## 2019-07-19 MED ORDER — WARFARIN SODIUM 6 MG PO TABS
6.0000 mg | ORAL_TABLET | Freq: Once | ORAL | Status: AC
  Administered 2019-07-19: 6 mg via ORAL
  Filled 2019-07-19: qty 1

## 2019-07-19 MED ORDER — HYDROCODONE-ACETAMINOPHEN 5-325 MG PO TABS
1.0000 | ORAL_TABLET | Freq: Three times a day (TID) | ORAL | Status: DC | PRN
  Administered 2019-07-19 – 2019-07-20 (×2): 1 via ORAL
  Filled 2019-07-19 (×2): qty 1

## 2019-07-19 MED ORDER — ACETAMINOPHEN 500 MG PO TABS
1000.0000 mg | ORAL_TABLET | Freq: Three times a day (TID) | ORAL | Status: DC
  Administered 2019-07-19: 1000 mg via ORAL
  Filled 2019-07-19 (×2): qty 2

## 2019-07-19 MED ORDER — PANTOPRAZOLE SODIUM 40 MG PO TBEC
40.0000 mg | DELAYED_RELEASE_TABLET | Freq: Every day | ORAL | Status: DC
  Administered 2019-07-19 – 2019-07-20 (×2): 40 mg via ORAL
  Filled 2019-07-19 (×2): qty 1

## 2019-07-19 MED ORDER — LITHIUM CARBONATE 150 MG PO CAPS
150.0000 mg | ORAL_CAPSULE | Freq: Every day | ORAL | Status: DC
  Administered 2019-07-19 – 2019-07-20 (×2): 150 mg via ORAL
  Filled 2019-07-19 (×2): qty 1

## 2019-07-19 NOTE — Evaluation (Signed)
Physical Therapy Evaluation Patient Details Name: Meredith [name] MRN: [id number] DOB: 08/11/1950 Today's Date: 07/19/2019   History of Present Illness  69 year old female with history of bipolar disorder, HLD, anxiety  who presented with altered mental status. MRI brain: negative. EEG to evaluate for seizures: suggestive of cortical dysfunction in the left temporal region as well as mild diffuse encephalopathy. No seizures or epileptiform  Clinical Impression   Pt admitted with above diagnosis. Comes from home where she lives alone in an apt with a level entry; typically walks with a cane; presents to PT with decr activity tolerance, decr functional mobility, gait and balance dysfunction; notably soiled with stool in the bed upon PT arrival; episode of diarrhea during session;  Pt currently with functional limitations due to the deficits listed below (see PT Problem List). Pt will benefit from skilled PT to increase their independence and safety with mobility to allow discharge to the venue listed below.       Follow Up Recommendations SNF;Home health PT(Depending on progress, recommend considering [facility])    Equipment Recommendations  Rolling walker with 5" wheels;3in1 (PT)    Recommendations for Other Services       Precautions / Restrictions Precautions Precautions: Fall      Mobility  Bed Mobility Overal bed mobility: Needs Assistance Bed Mobility: Supine to Sit     Supine to sit: Min assist     General bed mobility comments: for trunk elevation, pt seeking HHA, cues to scoot to EOB  Transfers Overall transfer level: Needs assistance Equipment used: 1 person hand held assist;Rolling walker (2 wheeled) Transfers: Sit to/from Stand Sit to Stand: Min assist;Mod assist         General transfer comment: min assist to steady; initially stood from bed without RW; stood from commode with RW  Ambulation/Gait Ambulation/Gait assistance: Mod assist;Min  assist Gait Distance (Feet): 20 Feet(x2) Assistive device: 1 person hand held assist;IV Pole;Rolling walker (2 wheeled) Gait Pattern/deviations: Decreased step length - right;Decreased step length - left     General Gait Details: initiatlly walkedc to bathroom with handheld assist and pt holding IVpole; naturaly reaching for bilateral UE support, so walked back to recliner with RW  Stairs            Wheelchair Mobility    Modified Rankin (Stroke Patients Only)       Balance     Sitting balance-Leahy Scale: Good       Standing balance-Leahy Scale: Poor Standing balance comment: reliant on UE support/external assist                              Pertinent Vitals/Pain Pain Assessment: No/denies pain    Home Living Family/patient expects to be discharged to:: Private residence Living Arrangements: Alone Available Help at Discharge: Family;Available PRN/intermittently Type of Home: Apartment Home Access: Level entry     Home Layout: One level Home Equipment: Grab bars - tub/shower;Cane - single point      Prior Function Level of Independence: Independent with assistive device(s)         Comments: using SPC in community, driving     Hand Dominance   Dominant Hand: Right    Extremity/Trunk Assessment   Upper Extremity Assessment Upper Extremity Assessment: Defer to OT evaluation    Lower Extremity Assessment Lower Extremity Assessment: Generalized weakness       Communication   Communication: Expressive difficulties  Cognition Arousal/Alertness: Awake/alert  Behavior During Therapy: [facility] for tasks assessed/performed                                   General Comments: pt slow to respond to questions and to initiate tasks, overall following commands well, A&Ox4 (disoriented to specific day/date)      General Comments General comments (skin integrity, edema, etc.): incontinent of bowel in the bed upon arrival     Exercises     Assessment/Plan    PT Assessment Patient needs continued PT services  PT Problem List Decreased strength;Decreased activity tolerance;Decreased balance;Decreased mobility;Decreased coordination;Decreased cognition;Decreased knowledge of use of DME;Decreased safety awareness;Decreased knowledge of precautions       PT Treatment Interventions DME instruction;Gait training;Stair training;Functional mobility training;Therapeutic activities;Therapeutic exercise;Balance training;Cognitive remediation;Patient/family education    PT Goals (Current goals can be found in the Care Plan section)  Acute Rehab PT Goals Patient Stated Goal: regain her independence PT Goal Formulation: With patient Time For Goal Achievement: 08/02/19 Potential to Achieve Goals: Good    Frequency Min 3X/week   Barriers to discharge Decreased caregiver support      Co-evaluation               AM-PAC PT "6 Clicks" Mobility  Outcome Measure Help needed turning from your back to your side while in a flat bed without using bedrails?: A Little Help needed moving from lying on your back to sitting on the side of a flat bed without using bedrails?: A Little Help needed moving to and from a bed to a chair (including a wheelchair)?: A Little Help needed standing up from a chair using your arms (e.g., wheelchair or bedside chair)?: A Little Help needed to walk in hospital room?: A Little Help needed climbing 3-5 steps with a railing? : A Lot 6 Click Score: 17    End of Session Equipment Utilized During Treatment: Gait belt Activity Tolerance: Patient tolerated treatment well Patient left: in chair;with call bell/phone within reach;with chair alarm set Nurse Communication: Mobility status PT Visit Diagnosis: Unsteadiness on feet (R26.81);Other abnormalities of gait and mobility (R26.89)    Time: 1244-1330 PT Time Calculation (min) (ACUTE ONLY): 46 min   Charges:   PT Evaluation $PT Eval  Moderate Complexity: 1 Mod PT Treatments $Gait Training: 8-22 mins $Therapeutic Activity: 8-22 mins        Roney Marion, PT  Acute Rehabilitation Services Pager [phone] Office [phone]   Colletta [place] 07/19/2019, 5:47 PM

## 2019-07-19 NOTE — Progress Notes (Signed)
ANTICOAGULATION CONSULT NOTE - Initial Consult  Pharmacy Consult for warfarin Indication: atrial fibrillation  Allergies  Allergen Reactions  . Codeine Anaphylaxis    Patient states she can take codeine but not percocet   . Adhesive [Tape]     blister  . Celebrex [Celecoxib] Hives  . Sulfa Antibiotics Hives  . Tricyclic Antidepressants     Doesn't help  . Amoxicillin Rash  . Ampicillin Rash  . Cephalexin Rash  . Penicillins Rash    Did it involve swelling of the face/tongue/throat, SOB, or low BP? Yes Did it involve sudden or severe rash/hives, skin peeling, or any reaction on the inside of your mouth or nose? NO Did you need to seek medical attention at a hospital or doctor's office? NO When did it last happen? childhood If all above answers are "NO", may proceed with cephalosporin use.    Patient Measurements: Height: 5' (152.4 cm) Weight: 76.4 kg (168 lb 6.9 oz) IBW/kg (Calculated) : 45.5  Vital Signs: Temp: 97.5 F (36.4 C) (05/23 1004) Temp Source: Oral (05/23 1004) BP: 115/69 (05/23 1004) Pulse Rate: 63 (05/23 1004)  Labs: Recent Labs    07/17/19 0437 07/17/19 0437 07/17/19 1125 07/18/19 0323 07/19/19 0438  HGB 12.8   < >  --  12.6 12.3  HCT 39.2  --   --  37.1 36.6  PLT 244  --   --  232 221  LABPROT 15.7*  --   --  15.6* 14.4  INR 1.3*  --   --  1.3* 1.2  CREATININE  --   --  0.82 0.80 0.85   < > = values in this interval not displayed.    Estimated Creatinine Clearance: 57.1 mL/min (by C-G formula based on SCr of 0.85 mg/dL).   Assessment: 69 yo W on warfarin PTA for afib. INR on admit was 1.3, unclear when last dose was. Patient was last seen at [facility]-coag clinic on 5/10 with INR of 2.4. Warfarin was held on admission due to AMS, restarting 5/23 given patient's mental status is near baseline. Patient has been on enoxaparin treatment dose and will continue until INR is therapeutic.  PTA Warfarin = 5mg  on Monday, 3.75mg  all other days (INRs  have been therapeutic since March on this dose)  Goal of Therapy:  INR 2-3 Monitor platelets by anticoagulation protocol: Yes   Plan:  Warfarin 6mg  x1 today Stop enoxaparin when INR is 2 or greater  Monitor daily INR, CBC/plt Monitor for signs/symptoms of bleeding    Benetta Spar, PharmD, BCPS, BCCP Clinical Pharmacist  Please check AMION for all [place] phone numbers After 10:00 PM, call [place]

## 2019-07-19 NOTE — Progress Notes (Signed)
PROGRESS NOTE  Meredith [name] [id number] DOB: 01-15-1951   PCP: Kristen Loader, FNP  Patient is from: Home.  At baseline able to complete her ADLs without assistance.  DOA: 07/15/2019 LOS: 4  Brief Narrative / Interim history: 69 year old female with history of bipolar disorder, A. fib on Coumadin, AAA, HTN, HLD and hypothyroidism brought to ED by EMS due to acute altered mental status.  She was found covered in stool when EMS arrived.  In ED, afebrile.  BP elevated.  Other vital signs maintained.  WBC 11.2. Cr 1.05.  Lithium level 0.22. AG 17.  CT head and CXR negative for acute finding.  UDS positive for opiates.  UA without signs of infection.  Patient was started on oral vancomycin.  Received a liter of normal saline bolus, and admitted for possible colitis. C. difficile screen positive for antigen but negative for toxin.  PCR negative.  GIP negative.  Vancomycin and Flagyl discontinued.  Psychiatry consulted for encephalopathy, and recommended treating underlying cause of delirium,  p.o. Seroquel 25 to 50 mg twice daily and Haldol as needed. Patient's encephalopathy seems to have resolved after reducing/adjusting home psychotropics.  However, patient is anxious about changing her medications.  Evaluated by therapy who recommended SNF.   Subjective: Seen and examined earlier this morning.  No major events overnight or this morning.  Complains low back.  She says she is on Norco at home.  Denies focal neuro symptoms.  She is awake, alert and oriented x4 except date.  Objective: Vitals:   07/18/19 1842 07/18/19 2024 07/19/19 0600 07/19/19 1004  BP: (!) 105/58 (!) 110/58 (!) 132/58 115/69  Pulse: 84 80 66 63  Resp: 18 18 18 18   Temp: 98.1 F (36.7 C) 98.4 F (36.9 C) 98 F (36.7 C) (!) 97.5 F (36.4 C)  TempSrc:  Oral Oral Oral  SpO2: 100% 98% 97% 99%  Weight:  76.4 kg    Height:        Intake/Output Summary (Last 24 hours) at 07/19/2019 1306 Last data filed at 07/19/2019  1200 Gross per 24 hour  Intake 1134.23 ml  Output 304 ml  Net 830.23 ml   Filed Weights   07/16/19 1145 07/18/19 2024  Weight: 77.1 kg 76.4 kg    Examination:  GENERAL: No apparent distress.  Nontoxic. HEENT: MMM.  Vision and hearing grossly intact.  NECK: Supple.  No apparent JVD.  RESP: 97% on RA.  No IWOB.  Fair aeration bilaterally. CVS:  RRR. Heart sounds normal.  ABD/GI/GU: BS+. Abd soft, NTND.  MSK/EXT:  Moves extremities. No apparent deformity. No edema.  SKIN: no apparent skin lesion or wound NEURO: Awake, alert and oriented x4 except to date.  No apparent focal neuro deficit. PSYCH: Calm. Normal affect.  Good insight.  Procedures:  None  Microbiology summarized: [id number] negative. C. difficile antigen positive but toxin and PCR negative. GIP negative.  Assessment & Plan: Acute metabolic encephalopathy: POA.  Likely iatrogenic from pain medication and home psychotropics.  CT head, TFT, RPR, a.m. cortisol, B12 and ammonia unrevealing.  No focal neuro deficit.  Encephalopathy resolved.  She is oriented x4 except date.  -Follow B1 level -Continue empiric high-dose thiamine -Appreciate recs by psych  Colitis?  POA.  It seems she is colonized by C. Difficile but doubt active infection.  GIP and C. difficile toxin and PCR are negative.  Abdominal exam is benign.  No significant leukocytosis or fever.  Diarrhea could be due to Metformin.  Diarrhea resolved.  Discontinued vancomycin and Flagyl -As needed Imodium  Paroxysmal A. Fib: Rate controlled.  On metoprolol, verapamil and warfarin at home.  Subtherapeutic INR on admission.  Followed by Dr. Katharina Caper.  Had ablation in the past.  Per cardiology note, not able to afford DOAC. -On verapamil, metoprolol and Lovenox here. -Restart warfarin.  Continue Lovenox for bridging while here. CHA2DS2-VASc score 4.  AKI: POA-resolved.  Unlikely CKD.  Anxiety/bipolar disorder: On high-dose BuSpar, Lamictal, lithium and Seroquel at  home.  Followed by Dr. Clovis Pu.  Lithium level low. -Psych consulted for AME and recommended Seroquel 25 to 50 mg twice daily and Haldol as needed.  Patient is anxious about reducing or stopping her bipolar medications. Will increase her Lamictal from 50 to 100 mg daily.  Resume home lithium. May slowly increase Seroquel as appropriate -Continue reduced dose of BuSpar.  Chronic pain syndrome: Followed by [name] outpatient.  Seen baclofen on her medication list.  Also reports taking Norco at home.  Per narcotic database, field Norco 7.5/325, #90 for 30-day supply on 5/7. -Scheduled Tylenol 1 g 3 times daily -Norco 5/325 mg every 8 hours as needed moderate pain -Continue reduced dose Lyrica   Controlled DM-2: A1c 5.8%.  On Metformin at home. Recent Labs    07/18/19 2020 07/19/19 0654 07/19/19 1117  GLUCAP 91 84 83  -Discontinue CBG monitoring and SSI   Mild leukocytosis: Resolved.   Hypothyroidism: TFTs slightly low.  Free T4 not reliable while she is on Synthroid. -Continue home Synthroid and recheck TSH in 4 to 6 weeks  At risk for polypharmacy -The minimum the better. Fine-tune her home medications  Hypokalemia: Resolved. -Recheck a replenish as appropriate  AAA -Outpatient follow-up  History of migraine: Stable  Debility -Therapy recommended SNF.         DVT prophylaxis: On therapeutic Lovenox for A. fib Code Status: Full code Family Communication: Patient and/or RN. Available if any question.  Status is: Inpatient  Remains inpatient appropriate because:Unsafe d/c plan   Dispo: The patient is from: Home              Anticipated d/c is to: SNF              Anticipated d/c date is: 2 days              Patient currently is medically stable to d/c.        Consultants:  Psychiatry   Sch Meds:  Scheduled Meds: . acetaminophen  1,000 mg Oral Q8H  . busPIRone  10 mg Oral BID  . enoxaparin (LOVENOX) injection  80 mg Subcutaneous Q12H  .  HYDROcodone-acetaminophen  1 tablet Oral Q8H  . [START ON 07/20/2019] lamoTRIgine  100 mg Oral Daily  . levothyroxine  100 mcg Oral QAC breakfast  . lithium carbonate  150 mg Oral Daily  . lithium carbonate  300 mg Oral QHS  . loratadine  10 mg Oral QPM  . metoprolol succinate  50 mg Oral Daily  . pantoprazole  40 mg Oral Daily  . pregabalin  25 mg Oral TID  . QUEtiapine  50 mg Oral BID  . sodium chloride flush  3 mL Intravenous Q12H  . verapamil  120 mg Oral QHS   Continuous Infusions: . potassium PHOSPHATE IVPB (in mmol) 85 mL/hr at 07/19/19 1000  . thiamine injection Stopped (07/19/19 0928)   PRN Meds:.albuterol, haloperidol lactate, hydrALAZINE, loperamide, metoprolol tartrate, [DISCONTINUED] ondansetron **OR** ondansetron (ZOFRAN) IV  Antimicrobials: Anti-infectives (From admission, onward)  Start     Dose/Rate Route Frequency Ordered Stop   07/16/19 1045  vancomycin (VANCOCIN) 50 mg/mL oral solution 125 mg  Status:  Discontinued     125 mg Oral 4 times daily 07/16/19 1032 07/17/19 1140   07/15/19 1930  metroNIDAZOLE (FLAGYL) IVPB 500 mg  Status:  Discontinued     500 mg 100 mL/hr over 60 Minutes Intravenous Every 8 hours 07/15/19 1916 07/17/19 1140   07/15/19 1930  ciprofloxacin (CIPRO) IVPB 400 mg  Status:  Discontinued     400 mg 200 mL/hr over 60 Minutes Intravenous Every 12 hours 07/15/19 1916 07/16/19 0924   07/15/19 1500  vancomycin (VANCOCIN) 50 mg/mL oral solution 125 mg  Status:  Discontinued     125 mg Oral 4 times daily 07/15/19 1456 07/15/19 2105       I have personally reviewed the following labs and images: CBC: Recent Labs  Lab 07/15/19 1150 07/16/19 0645 07/17/19 0437 07/18/19 0323 07/19/19 0438  WBC 11.3* 10.7* 9.8 8.4 7.9  NEUTROABS 9.1*  --   --   --   --   HGB 13.0 11.6* 12.8 12.6 12.3  HCT 39.0 35.4* 39.2 37.1 36.6  MCV 91.5 91.9 91.4 90.0 91.0  PLT 245 209 244 232 221   BMP &GFR Recent Labs  Lab 07/15/19 1150 07/16/19 0645  07/17/19 1125 07/18/19 0323 07/19/19 0438  NA 142 141 142 138 140  K 3.5 2.8* 3.7 2.8* 3.7  CL 105 110 113* 107 111  CO2 20* 19* 21* 22 23  GLUCOSE 131* 103* 119* 102* 100*  BUN 20 13 16 12 15   CREATININE 1.05* 0.78 0.82 0.80 0.85  CALCIUM 9.9 7.7* 8.3* 8.1* 8.5*  MG 1.9 1.7 2.5* 2.2 2.3  PHOS 2.4*  --  1.9* 2.7 2.2*   Estimated Creatinine Clearance: 57.1 mL/min (by C-G formula based on SCr of 0.85 mg/dL). Liver & Pancreas: Recent Labs  Lab 07/15/19 1150 07/17/19 1125 07/18/19 0323 07/19/19 0438  AST 20  --   --   --   ALT 14  --   --   --   ALKPHOS 43  --   --   --   BILITOT 0.9  --   --   --   PROT 6.9  --   --   --   ALBUMIN 4.3 3.4* 3.3* 3.2*   No results for input(s): LIPASE, AMYLASE in the last 168 hours. Recent Labs  Lab 07/15/19 1316  AMMONIA <9*   Diabetic: No results for input(s): HGBA1C in the last 72 hours. Recent Labs  Lab 07/18/19 1129 07/18/19 1634 07/18/19 2020 07/19/19 0654 07/19/19 1117  GLUCAP 107* 93 91 84 83   Cardiac Enzymes: No results for input(s): CKTOTAL, CKMB, CKMBINDEX, TROPONINI in the last 168 hours. No results for input(s): PROBNP in the last 8760 hours. Coagulation Profile: Recent Labs  Lab 07/15/19 1150 07/16/19 0645 07/17/19 0437 07/18/19 0323 07/19/19 0438  INR 1.3* 1.3* 1.3* 1.3* 1.2   Thyroid Function Tests: No results for input(s): TSH, T4TOTAL, FREET4, T3FREE, THYROIDAB in the last 72 hours. Lipid Profile: No results for input(s): CHOL, HDL, LDLCALC, TRIG, CHOLHDL, LDLDIRECT in the last 72 hours. Anemia Panel: Recent Labs    07/17/19 1248  VITAMINB12 284   Urine analysis:    Component Value Date/Time   COLORURINE YELLOW 07/15/2019 [place] 07/15/2019 1222   LABSPEC 1.026 07/15/2019 1222   PHURINE 6.0 07/15/2019 1222   GLUCOSEU NEGATIVE 07/15/2019 1222  HGBUR NEGATIVE 07/15/2019 [place] 07/15/2019 1222   KETONESUR 20 (A) 07/15/2019 1222   PROTEINUR 30 (A)  07/15/2019 1222   NITRITE NEGATIVE 07/15/2019 1222   LEUKOCYTESUR NEGATIVE 07/15/2019 1222   Sepsis Labs: Invalid input(s): PROCALCITONIN, [place]  Microbiology: Recent Results (from the past 240 hour(s))  SARS Coronavirus 2 by RT PCR (hospital order, performed in [facility] hospital lab) Nasopharyngeal Nasopharyngeal Swab     Status: None   Collection Time: 07/15/19  1:18 PM   Specimen: Nasopharyngeal Swab  Result Value Ref Range Status   SARS Coronavirus 2 NEGATIVE NEGATIVE Final    Comment: (NOTE) SARS-CoV-2 target nucleic acids are NOT DETECTED. The SARS-CoV-2 RNA is generally detectable in upper and lower respiratory specimens during the acute phase of infection. The lowest concentration of SARS-CoV-2 viral copies this assay can detect is 250 copies / mL. A negative result does not preclude SARS-CoV-2 infection and should not be used as the sole basis for treatment or other patient management decisions.  A negative result may occur with improper specimen collection / handling, submission of specimen other than nasopharyngeal swab, presence of viral mutation(s) within the areas targeted by this assay, and inadequate number of viral copies (<250 copies / mL). A negative result must be combined with clinical observations, patient history, and epidemiological information. Fact Sheet for Patients:   StrictlyIdeas.no Fact Sheet for Healthcare Providers: BankingDealers.co.za This test is not yet approved or cleared  by the [place] FDA and has been authorized for detection and/or diagnosis of SARS-CoV-2 by FDA under an Emergency Use Authorization (EUA).  This EUA will remain in effect (meaning this test can be used) for the duration of the COVID-19 declaration under Section 564(b)(1) of the Act, 21 U.S.C. section 360bbb-3(b)(1), unless the authorization is terminated or revoked sooner. Performed at [facility],  [place] [address] [place], [place] [address]   Gastrointestinal Panel by PCR , Stool     Status: None   Collection Time: 07/15/19  1:46 PM   Specimen: Stool  Result Value Ref Range Status   Campylobacter species NOT DETECTED NOT DETECTED Final   Plesimonas shigelloides NOT DETECTED NOT DETECTED Final   Salmonella species NOT DETECTED NOT DETECTED Final   Yersinia enterocolitica NOT DETECTED NOT DETECTED Final   Vibrio species NOT DETECTED NOT DETECTED Final   Vibrio cholerae NOT DETECTED NOT DETECTED Final   Enteroaggregative E coli (EAEC) NOT DETECTED NOT DETECTED Final   Enteropathogenic E coli (EPEC) NOT DETECTED NOT DETECTED Final   Enterotoxigenic E coli (ETEC) NOT DETECTED NOT DETECTED Final   Shiga like toxin producing E coli (STEC) NOT DETECTED NOT DETECTED Final   Shigella/Enteroinvasive E coli (EIEC) NOT DETECTED NOT DETECTED Final   Cryptosporidium NOT DETECTED NOT DETECTED Final   Cyclospora cayetanensis NOT DETECTED NOT DETECTED Final   Entamoeba histolytica NOT DETECTED NOT DETECTED Final   Giardia lamblia NOT DETECTED NOT DETECTED Final   Adenovirus F40/41 NOT DETECTED NOT DETECTED Final   Astrovirus NOT DETECTED NOT DETECTED Final   Norovirus GI/GII NOT DETECTED NOT DETECTED Final   Rotavirus A NOT DETECTED NOT DETECTED Final   Sapovirus (I, II, IV, and V) NOT DETECTED NOT DETECTED Final    Comment: Performed at [facility], [address] [place], [place] [address]  C Difficile Quick Screen w PCR reflex     Status: Abnormal   Collection Time: 07/15/19  1:46 PM   Specimen: Stool  Result Value Ref Range  Status   C Diff antigen POSITIVE (A) NEGATIVE Final   C Diff toxin NEGATIVE NEGATIVE Final   C Diff interpretation Results are indeterminate. See PCR results.  Final    Comment: Performed at [facility], [place] [address], [place], [place] [address]  C. Diff by PCR, Reflexed     Status: None   Collection Time: 07/15/19  1:46 PM  Result Value Ref Range  Status   Toxigenic C. Difficile by PCR NEGATIVE NEGATIVE Final    Comment: Patient is colonized with non toxigenic C. difficile. May not need treatment unless significant symptoms are present. Performed at [facility], [place] [address], [place], [place] [address]     Radiology Studies: No results found.    Brynda Heick T. Garrett  If 7PM-7AM, please contact night-coverage [email or web address] Password [facility] 07/19/2019, 1:06 PM

## 2019-07-19 NOTE — Plan of Care (Signed)

## 2019-07-19 NOTE — Progress Notes (Signed)
Per telemetry pt HR unsustained in 30's with known 2nd HR block. Pt asymptomatic. Dr. Myna Hidalgo informed.

## 2019-07-20 LAB — BASIC METABOLIC PANEL
Anion gap: 5 (ref 5–15)
BUN: 13 mg/dL (ref 8–23)
CO2: 23 mmol/L (ref 22–32)
Calcium: 8.5 mg/dL — ABNORMAL LOW (ref 8.9–10.3)
Chloride: 113 mmol/L — ABNORMAL HIGH (ref 98–111)
Creatinine, Ser: 0.83 mg/dL (ref 0.44–1.00)
GFR calc Af Amer: >60 mL/min (ref >60)
GFR calc non Af Amer: >60 mL/min (ref >60)
Glucose, Bld: 100 mg/dL — ABNORMAL HIGH (ref 70–99)
Potassium: 3.7 mmol/L (ref 3.5–5.1)
Sodium: 141 mmol/L (ref 135–145)

## 2019-07-20 LAB — CBC
HCT: 36.2 % (ref 36.0–46.0)
Hemoglobin: 11.8 g/dL — ABNORMAL LOW (ref 12.0–15.0)
MCH: 30.2 pg (ref 26.0–34.0)
MCHC: 32.6 g/dL (ref 30.0–36.0)
MCV: 92.6 fL (ref 80.0–100.0)
Platelets: 217 10*3/uL (ref 150–400)
RBC: 3.91 MIL/uL (ref 3.87–5.11)
RDW: 13.2 % (ref 11.5–15.5)
WBC: 6.7 10*3/uL (ref 4.0–10.5)
nRBC: 0 % (ref 0.0–0.2)

## 2019-07-20 LAB — PROTIME-INR
INR: 1.1 (ref 0.8–1.2)
Prothrombin Time: 13.9 seconds (ref 11.4–15.2)

## 2019-07-20 LAB — GLUCOSE, CAPILLARY
Glucose-Capillary: 94 mg/dL (ref 70–99)
Glucose-Capillary: 70 mg/dL (ref 70–99)
Glucose-Capillary: 77 mg/dL (ref 70–99)

## 2019-07-20 MED ORDER — HYDROCODONE-ACETAMINOPHEN 5-325 MG PO TABS: 1.0000 | ORAL_TABLET | Freq: Three times a day (TID) | ORAL | 0 refills | Status: DC | PRN

## 2019-07-20 MED ORDER — PREGABALIN 25 MG PO CAPS: 50.0000 mg | ORAL_CAPSULE | Freq: Three times a day (TID) | ORAL | 1 refills | Status: DC

## 2019-07-20 MED ORDER — WARFARIN SODIUM 6 MG PO TABS
6.0000 mg | ORAL_TABLET | Freq: Once | ORAL | Status: AC
  Administered 2019-07-20: 6 mg via ORAL
  Filled 2019-07-20 (×2): qty 1

## 2019-07-20 MED ORDER — ACETAMINOPHEN 500 MG PO TABS: 1000.0000 mg | ORAL_TABLET | Freq: Three times a day (TID) | ORAL | 0 refills | Status: DC

## 2019-07-20 MED ORDER — LORATADINE 10 MG PO TABS
10.0000 mg | ORAL_TABLET | Freq: Every day | ORAL | Status: DC | PRN
  Filled 2019-07-20: qty 1

## 2019-07-20 MED ORDER — WARFARIN SODIUM 6 MG PO TABS: 6.0000 mg | ORAL_TABLET | Freq: Once | ORAL | 0 refills | Status: DC

## 2019-07-20 MED ORDER — QUETIAPINE FUMARATE 50 MG PO TABS: 50.0000 mg | ORAL_TABLET | Freq: Two times a day (BID) | ORAL | 2 refills | Status: DC

## 2019-07-20 MED ORDER — LOPERAMIDE HCL 2 MG PO CAPS: 2.0000 mg | ORAL_CAPSULE | ORAL | 0 refills | Status: DC | PRN

## 2019-07-20 MED ORDER — BUSPIRONE HCL 10 MG PO TABS: 10.0000 mg | ORAL_TABLET | Freq: Two times a day (BID) | ORAL | 1 refills | Status: DC

## 2019-07-20 NOTE — Progress Notes (Signed)
[place] for warfarin / Lovenox Indication: atrial fibrillation  Allergies  Allergen Reactions  . Codeine Anaphylaxis    Patient states she can take codeine but not percocet   . Adhesive [Tape]     blister  . Celebrex [Celecoxib] Hives  . Sulfa Antibiotics Hives  . Tricyclic Antidepressants     Doesn't help  . Amoxicillin Rash  . Ampicillin Rash  . Cephalexin Rash  . Penicillins Rash    Did it involve swelling of the face/tongue/throat, SOB, or low BP? Yes Did it involve sudden or severe rash/hives, skin peeling, or any reaction on the inside of your mouth or nose? NO Did you need to seek medical attention at a hospital or doctor's office? NO When did it last happen? childhood If all above answers are "NO", may proceed with cephalosporin use.    Labs: Recent Labs    07/18/19 0323 07/18/19 0323 07/19/19 0438 07/20/19 0507  HGB 12.6   < > 12.3 11.8*  HCT 37.1  --  36.6 36.2  PLT 232  --  221 217  LABPROT 15.6*  --  14.4 13.9  INR 1.3*  --  1.2 1.1  CREATININE 0.80  --  0.85 0.83   < > = values in this interval not displayed.    Estimated Creatinine Clearance: 58.7 mL/min (by C-G formula based on SCr of 0.83 mg/dL).   Assessment: 69 yo W on warfarin PTA for afib. INR on admit was 1.3, unclear when last dose was. Patient was last seen [facility] on 5/10 with INR of 2.4. Warfarin was held on admission due to AMS, restarting 5/23 given patient's mental status is near baseline. Patient has been on enoxaparin treatment dose and will continue until INR is therapeutic.  PTA Warfarin = 5mg  on Monday, 3.75mg  all other days (INRs have been therapeutic since March on this dose)  Goal of Therapy:  INR 2-3 Monitor platelets by anticoagulation protocol: Yes   Plan:  Repeat Warfarin 6mg  x1 today Stop enoxaparin when INR is 2 or greater  Monitor daily INR, CBC/plt Monitor for signs/symptoms of bleeding   Thank you Anette Guarneri, PharmD   Please check AMION for all [place] phone numbers After 10:00 PM, call [place]

## 2019-07-20 NOTE — Discharge Summary (Signed)
Physician Discharge Summary  Meredith [name] [id number] DOB: [birth year] DOA: 07/15/2019  PCP: Kristen Loader, FNP  Admit date: 07/15/2019 Discharge date: 07/20/2019  Time spent: 45 minutes  Recommendations for Outpatient Follow-up:  1. Needs labs including inr 4-5 days 2. Note med changes 3. follow b1 testing/ conisder methylmalonic acid in 1 week as was being Rx for this 4. consider discussion wit GI as OP Re: Imodium vs Aimovig  Discharge Diagnoses:  Principal Problem:   C. difficile diarrhea Active Problems:   PAF (paroxysmal atrial fibrillation) (HCC)   Bipolar disorder, curr episode mixed, severe, with psychotic features ([place])   Hypothyroidism   Chronic kidney disease, stage III (moderate)   Leukocytosis   Diabetes mellitus type 2, controlled ([place])   Discharge Condition: improved   Diet recommendation: hh low salt dm  Filed Weights   07/16/19 1145 07/18/19 2024 07/19/19 2042  Weight: 77.1 kg 76.4 kg 77 kg    History of present illness:  69 year old bipolar disorder followed Dr. Clovis Pu psychiatry, permanent A. fib/Coumadin CHADS2 score >3 status post 2 ablations sees Dr. Debbrah Alar sub-AC Chronic migraines prior Rx Botox [facility] Prior back surgeries-used to see pain management [name] Partial lung lobectomy and thoracic spine surgery AAA HLD hypothyroid, CKD 2-3 Admit 0000000 metabolic encephalopathy lithium toxicity Represents 07/15/2019 acutely altered-lives at home by self-missed 2 medical appointments EMS was called found covered in stool ED work-up = BP 180 WBC 11.2 BUN/creatinine 20/1.05 Lithium level 0.22 anion gap 17 CT head - Started empirically on C. difficile Rx vancomycin-transition to Cipro Flagyl as unable to tolerate p.o. Pathogen panel pending  psch consulted  Hospital Course:  Acute metabolic encephalopathy: POA.  Likely iatrogenic from pain medication and home psychotropics.  CT head, TFT, RPR, a.m. cortisol, B12  and ammonia unrevealing.  No focal neuro deficit.  Encephalopathy resolved.  She is oriented x4 except date.  -Follow B1 level -Continue empiric high-dose thiamine as op -Appreciate recs by psych  Colitis?  POA.  It seems she is colonized by C. Difficile but doubt active infection.  GIP and C. difficile toxin and PCR are negative.  Abdominal exam is benign.  No significant leukocytosis or fever.  Diarrhea could be due to Metformin.  Diarrhea resolved.  Discontinued vancomycin and Flagyl -As needed Imodium--also on Aimovig  Paroxysmal A. Fib: Rate controlled.  On metoprolol, verapamil and warfarin at home.  Subtherapeutic INR on admission.  Followed by Dr. Katharina Caper.  Had ablation in the past.  Per cardiology note, not able to afford DOAC. -On verapamil, metoprolol and Lovenox here. -Restart warfarin and d/con higher dosing--need recheck in several days  AKI: POA-resolved.  Unlikely CKD.  Anxiety/bipolar disorder: On high-dose BuSpar, Lamictal, lithium and Seroquel at home.  Followed by Dr. Clovis Pu.  Lithium level low. -Psych consulted for [facility]E and recommended Seroquel 25 to 50 mg twice daily and Haldol as needed.  Patient is anxious about reducing or stopping her bipolar medications. Will increase her Lamictal from 50 to 100 mg daily.  Resume home lithium. May slowly increase Seroquel as appropriate as OP -Continue reduced dose of BuSpar.  Chronic pain syndrome: Followed by [name] outpatient.  Seen baclofen on her medication list.  Also reports taking Norco at home.  Per narcotic database, field Norco 7.5/325, #90 for 30-day supply on 5/7. -Scheduled Tylenol 1 g 3 times daily -Norco 5/325 mg every 8 hours as needed moderate pain -Continue reduced dose Lyrica  Controlled DM-2: A1c 5.8%.  On Metformin  at home. -Discontinue CBG monitoring and SSI  Mild leukocytosis: Resolved.  Hypothyroidism: TFTs slightly low.  Free T4 not reliable while she is on Synthroid. -Continue home  Synthroid and recheck TSH in 4 to 6 weeks  At risk for polypharmacy -The minimum the better. Fine-tune her home medications  Hypokalemia: Resolved. -Recheck a replenish as appropriate  AAA -Outpatient follow-up  History of migraine: Stable  Debility -Therapy recommended SNF.  She watns home--ordered [place]    Consultations:  psychiatray  Discharge Exam: Vitals:   07/20/19 0625 07/20/19 0843  BP: 122/67 (!) 106/48  Pulse: 65 71  Resp: 18 18  Temp: 98.6 F (37 C) 98 F (36.7 C)  SpO2: 97% 100%    General: much more coherent pleasant in nad no focal deficit Cardiovascular: s1 s 2no m/r/g Respiratory: clear  abd soft Psych euithymic coherent  Neuro intact  Discharge Instructions   Discharge Instructions    Diet - low sodium heart healthy   Complete by: As directed    Discharge instructions   Complete by: As directed    Look at your medications carefully as a lot of them seems to have changed Would recommend that you follow-up with your psychiatrist in the near future I have discontinued your Metformin Zofran baclofen and Benadryl allergy as this could have contributed to your confusion You will only be given a limited prescription of Percocet as you had a refill recently on 5/7 supposed to last 30 days I would recommend that you in addition follow-up with your pain physician You will need labs in the next week including an INR Chem-12 and CBC-these labs are important because you are on Coumadin and your Coumadin levels need to be checked soon You may take medication to prevent you from having loose stool such as I Imodium but do not take it regularly take it only as needed if you have 2 or 3 stools a day   Increase activity slowly   Complete by: As directed      Allergies as of 07/20/2019      Reactions   Codeine Anaphylaxis   Patient states she can take codeine but not percocet   Adhesive [tape]    blister   Celebrex [celecoxib] Hives   Sulfa Antibiotics  Hives   Tricyclic Antidepressants    Doesn't help   Amoxicillin Rash   Ampicillin Rash   Cephalexin Rash   Penicillins Rash   Did it involve swelling of the face/tongue/throat, SOB, or low BP? Yes Did it involve sudden or severe rash/hives, skin peeling, or any reaction on the inside of your mouth or nose? NO Did you need to seek medical attention at a hospital or doctor's office? NO When did it last happen? childhood If all above answers are "NO", may proceed with cephalosporin use.      Medication List    STOP taking these medications   baclofen 10 MG tablet Commonly known as: LIORESAL   Benadryl Allergy 25 mg capsule Generic drug: diphenhydrAMINE   clotrimazole 1 % cream Commonly known as: LOTRIMIN   metFORMIN 500 MG tablet Commonly known as: GLUCOPHAGE   ondansetron 4 MG tablet Commonly known as: ZOFRAN   QUEtiapine 300 MG 24 hr tablet Commonly known as: SEROQUEL XR Replaced by: QUEtiapine 50 MG tablet     TAKE these medications   acetaminophen 500 MG tablet Commonly known as: TYLENOL Take 2 tablets (1,000 mg total) by mouth every 8 (eight) hours.   Aimovig 70 MG/ML [place]  Generic drug: Erenumab-aooe Inject 70 mg into the skin every 30 (thirty) days.   aspirin 325 MG tablet Take 325 mg by mouth daily as needed for headache.   busPIRone 10 MG tablet Commonly known as: BUSPAR Take 1 tablet (10 mg total) by mouth 2 (two) times daily. What changed:   medication strength  how much to take   calcium carbonate 1250 (500 Ca) MG tablet Commonly known as: OS-CAL - dosed in mg of elemental calcium Take 1 tablet by mouth daily with breakfast.   estradiol 1 MG tablet Commonly known as: ESTRACE Take 1 mg by mouth daily.   ferrous sulfate 325 (65 FE) MG tablet Take 325 mg by mouth 2 (two) times daily.   HYDROcodone-acetaminophen 5-325 MG tablet Commonly known as: NORCO/VICODIN Take 1 tablet by mouth every 8 (eight) hours as needed for moderate pain.    lamoTRIgine 100 MG tablet Commonly known as: LAMICTAL Take 1 tablet by mouth twice daily   levocetirizine 5 MG tablet Commonly known as: XYZAL Take 5 mg by mouth every evening.   levothyroxine 100 MCG tablet Commonly known as: SYNTHROID Take 1 tablet (100 mcg total) by mouth daily before breakfast.   lithium carbonate 300 MG capsule Take 1 capsule (300 mg total) by mouth at bedtime.   lithium carbonate 150 MG capsule Take 1 capsule (150 mg total) by mouth daily.   loperamide 2 MG capsule Commonly known as: IMODIUM Take 1 capsule (2 mg total) by mouth as needed for diarrhea or loose stools.   melatonin 5 MG Tabs Take 15 mg by mouth at bedtime.   metoprolol succinate 50 MG 24 hr tablet Commonly known as: TOPROL-XL TAKE 1 TABLET BY MOUTH ONCE DAILY WITH  OR  IMMEDIATELY  FOLLOWING  A  MEAL What changed: See the new instructions.   pantoprazole 40 MG tablet Commonly known as: PROTONIX Take 1 tablet (40 mg total) by mouth daily.   pravastatin 20 MG tablet Commonly known as: PRAVACHOL Take 20 mg by mouth daily.   pregabalin 25 MG capsule Commonly known as: LYRICA Take 2 capsules (50 mg total) by mouth 3 (three) times daily. What changed: medication strength   QUEtiapine 50 MG tablet Commonly known as: SEROQUEL Take 1 tablet (50 mg total) by mouth 2 (two) times daily. Replaces: QUEtiapine 300 MG 24 hr tablet   senna-docusate 8.6-50 MG tablet Commonly known as: Peri-Colace Take 2 tablets by mouth 2 (two) times daily. For constipation   verapamil 120 MG 24 hr capsule Commonly known as: VERELAN PM Take 1 capsule (120 mg total) by mouth at bedtime. For high blood pressure   vitamin C 1000 MG tablet Take 1,000 mg by mouth daily.   Vitamin D3 25 MCG (1000 UT) Caps Take 5,000 Units by mouth daily.   warfarin 6 MG tablet Commonly known as: COUMADIN Take as directed. If you are unsure how to take this medication, talk to your nurse or doctor. Original instructions:  Take 1 tablet (6 mg total) by mouth one time only at 4 PM. What changed:   medication strength  how much to take  when to take this  additional instructions      Allergies  Allergen Reactions  . Codeine Anaphylaxis    Patient states she can take codeine but not percocet   . Adhesive [Tape]     blister  . Celebrex [Celecoxib] Hives  . Sulfa Antibiotics Hives  . Tricyclic Antidepressants     Doesn't help  . Amoxicillin  Rash  . Ampicillin Rash  . Cephalexin Rash  . Penicillins Rash    Did it involve swelling of the face/tongue/throat, SOB, or low BP? Yes Did it involve sudden or severe rash/hives, skin peeling, or any reaction on the inside of your mouth or nose? NO Did you need to seek medical attention at a hospital or doctor's office? NO When did it last happen? childhood If all above answers are "NO", may proceed with cephalosporin use.      The results of significant diagnostics from this hospitalization (including imaging, microbiology, ancillary and laboratory) are listed below for reference.    Significant Diagnostic Studies: CT Head Wo Contrast  Result Date: 07/15/2019 CLINICAL DATA:  Altered mental status. EXAM: CT HEAD WITHOUT CONTRAST TECHNIQUE: Contiguous axial images were obtained from the base of the skull through the vertex without intravenous contrast. COMPARISON:  10/22/2018 FINDINGS: Brain: No sign of acute infarction, mass lesion, hemorrhage, hydrocephalus or extra-axial collection. Minimal small vessel change of the hemispheric deep white matter. Vascular: No abnormal vascular finding. Skull: Normal Sinuses/Orbits: Clear/normal Other: None IMPRESSION: No acute or significant CT finding. Minimal small vessel change of the hemispheric white matter. Electronically Signed   By: Nelson Chimes M.D.   On: 07/15/2019 13:10   MR BRAIN WO CONTRAST  Result Date: 07/15/2019 CLINICAL DATA:  69 year old female with encephalopathy. Unexplained altered mental  status. EXAM: MRI HEAD WITHOUT CONTRAST TECHNIQUE: Multiplanar, multiecho pulse sequences of the brain and surrounding structures were obtained without intravenous contrast. COMPARISON:  Brain MRI 10/23/2018. Head CT earlier today. FINDINGS: Brain: Stable cerebral volume. No restricted diffusion to suggest acute infarction. No midline shift, mass effect, evidence of mass lesion, ventriculomegaly, extra-axial collection or acute intracranial hemorrhage. Cervicomedullary junction and pituitary are within normal limits. Unchanged mild ventricular prominence, but as before the temporal horns are decompressed. [place] Cables and white matter signal remains normal for age. No convincing encephalomalacia. No chronic cerebral blood products. Vascular: Major intracranial vascular flow voids are preserved. Dominant left vertebral artery. Skull and upper cervical spine: Partially obscured visible cervical spine today due to motion. Visible bone marrow signal appears to remain normal. Sinuses/Orbits: Stable and negative. Other: Mastoids remain clear. Visible internal auditory structures appear normal. Scalp and face soft tissues appear negative. IMPRESSION: No acute intracranial abnormality. Stable since 2020 and largely normal for age noncontrast MRI appearance of the brain. Electronically Signed   By: Genevie Ann M.D.   On: 07/15/2019 19:22   DG Chest Port 1 View  Result Date: 07/15/2019 CLINICAL DATA:  Altered mental status. Confusion. EXAM: PORTABLE CHEST 1 VIEW COMPARISON:  10/22/2018 FINDINGS: Heart size is stable and at the upper limits of normal. Elevation of right hemidiaphragm is unchanged. Mild scarring seen in left lung base. Both lungs are otherwise clear. Posterior thoracic spine fusion hardware again noted. IMPRESSION: Stable exam. No active disease. Electronically Signed   By: Marlaine Hind M.D.   On: 07/15/2019 12:11    Microbiology: Recent Results (from the past 240 hour(s))  SARS Coronavirus 2 by RT PCR (hospital  order, performed in [facility] hospital lab) Nasopharyngeal Nasopharyngeal Swab     Status: None   Collection Time: 07/15/19  1:18 PM   Specimen: Nasopharyngeal Swab  Result Value Ref Range Status   SARS Coronavirus 2 NEGATIVE NEGATIVE Final    Comment: (NOTE) SARS-CoV-2 target nucleic acids are NOT DETECTED. The SARS-CoV-2 RNA is generally detectable in upper and lower respiratory specimens during the acute phase of infection. The lowest concentration of  SARS-CoV-2 viral copies this assay can detect is 250 copies / mL. A negative result does not preclude SARS-CoV-2 infection and should not be used as the sole basis for treatment or other patient management decisions.  A negative result may occur with improper specimen collection / handling, submission of specimen other than nasopharyngeal swab, presence of viral mutation(s) within the areas targeted by this assay, and inadequate number of viral copies (<250 copies / mL). A negative result must be combined with clinical observations, patient history, and epidemiological information. Fact Sheet for Patients:   StrictlyIdeas.no Fact Sheet for Healthcare Providers: BankingDealers.co.za This test is not yet approved or cleared  by the [place] FDA and has been authorized for detection and/or diagnosis of SARS-CoV-2 by FDA under an Emergency Use Authorization (EUA).  This EUA will remain in effect (meaning this test can be used) for the duration of the COVID-19 declaration under Section 564(b)(1) of the Act, 21 U.S.C. section 360bbb-3(b)(1), unless the authorization is terminated or revoked sooner. Performed at [facility], [place] [address], [place], [place] [address]   Gastrointestinal Panel by PCR , Stool     Status: None   Collection Time: 07/15/19  1:46 PM   Specimen: Stool  Result Value Ref Range Status   Campylobacter species NOT DETECTED NOT DETECTED Final   Plesimonas  shigelloides NOT DETECTED NOT DETECTED Final   Salmonella species NOT DETECTED NOT DETECTED Final   Yersinia enterocolitica NOT DETECTED NOT DETECTED Final   Vibrio species NOT DETECTED NOT DETECTED Final   Vibrio cholerae NOT DETECTED NOT DETECTED Final   Enteroaggregative E coli (EAEC) NOT DETECTED NOT DETECTED Final   Enteropathogenic E coli (EPEC) NOT DETECTED NOT DETECTED Final   Enterotoxigenic E coli (ETEC) NOT DETECTED NOT DETECTED Final   Shiga like toxin producing E coli (STEC) NOT DETECTED NOT DETECTED Final   Shigella/Enteroinvasive E coli (EIEC) NOT DETECTED NOT DETECTED Final   Cryptosporidium NOT DETECTED NOT DETECTED Final   Cyclospora cayetanensis NOT DETECTED NOT DETECTED Final   Entamoeba histolytica NOT DETECTED NOT DETECTED Final   Giardia lamblia NOT DETECTED NOT DETECTED Final   Adenovirus F40/41 NOT DETECTED NOT DETECTED Final   Astrovirus NOT DETECTED NOT DETECTED Final   Norovirus GI/GII NOT DETECTED NOT DETECTED Final   Rotavirus A NOT DETECTED NOT DETECTED Final   Sapovirus (I, II, IV, and V) NOT DETECTED NOT DETECTED Final    Comment: Performed at [facility], [address] [place], [place] [address]  C Difficile Quick Screen w PCR reflex     Status: Abnormal   Collection Time: 07/15/19  1:46 PM   Specimen: Stool  Result Value Ref Range Status   C Diff antigen POSITIVE (A) NEGATIVE Final   C Diff toxin NEGATIVE NEGATIVE Final   C Diff interpretation Results are indeterminate. See PCR results.  Final    Comment: Performed at [facility], [place] [address], [place], [place] [address]  C. Diff by PCR, Reflexed     Status: None   Collection Time: 07/15/19  1:46 PM  Result Value Ref Range Status   Toxigenic C. Difficile by PCR NEGATIVE NEGATIVE Final    Comment: Patient is colonized with non toxigenic C. difficile. May not need treatment unless significant symptoms are present. Performed at [facility], [place] [address] [place],   [address]      Labs: Basic Metabolic Panel: Recent Labs  Lab 07/15/19 1150 07/15/19 1150 07/16/19 0645 07/17/19  1125 07/18/19 0323 07/19/19 0438 07/20/19 0507  NA 142   < > 141 142 138 140 141  K 3.5   < > 2.8* 3.7 2.8* 3.7 3.7  CL 105   < > 110 113* 107 111 113*  CO2 20*   < > 19* 21* 22 23 23   GLUCOSE 131*   < > 103* 119* 102* 100* 100*  BUN 20   < > 13 16 12 15 13   CREATININE 1.05*   < > 0.78 0.82 0.80 0.85 0.83  CALCIUM 9.9   < > 7.7* 8.3* 8.1* 8.5* 8.5*  MG 1.9  --  1.7 2.5* 2.2 2.3  --   PHOS 2.4*  --   --  1.9* 2.7 2.2*  --    < > = values in this interval not displayed.   Liver Function Tests: Recent Labs  Lab 07/15/19 1150 07/17/19 1125 07/18/19 0323 07/19/19 0438  AST 20  --   --   --   ALT 14  --   --   --   ALKPHOS 43  --   --   --   BILITOT 0.9  --   --   --   PROT 6.9  --   --   --   ALBUMIN 4.3 3.4* 3.3* 3.2*   No results for input(s): LIPASE, AMYLASE in the last 168 hours. Recent Labs  Lab 07/15/19 1316  AMMONIA <9*   CBC: Recent Labs  Lab 07/15/19 1150 07/15/19 1150 07/16/19 0645 07/17/19 0437 07/18/19 0323 07/19/19 0438 07/20/19 0507  WBC 11.3*   < > 10.7* 9.8 8.4 7.9 6.7  NEUTROABS 9.1*  --   --   --   --   --   --   HGB 13.0   < > 11.6* 12.8 12.6 12.3 11.8*  HCT 39.0   < > 35.4* 39.2 37.1 36.6 36.2  MCV 91.5   < > 91.9 91.4 90.0 91.0 92.6  PLT 245   < > 209 244 232 221 217   < > = values in this interval not displayed.   Cardiac Enzymes: No results for input(s): CKTOTAL, CKMB, CKMBINDEX, TROPONINI in the last 168 hours. BNP: BNP (last 3 results) No results for input(s): BNP in the last 8760 hours.  ProBNP (last 3 results) No results for input(s): PROBNP in the last 8760 hours.  CBG: Recent Labs  Lab 07/19/19 1117 07/19/19 1627 07/19/19 2053 07/20/19 0655 07/20/19 1134  GLUCAP 83 85 83 94 70       Signed:  Nita Sells MD   [facility] 07/20/2019, 3:43 PM

## 2019-07-20 NOTE — TOC Initial Note (Signed)
Transition of Care [facility]) - Initial/Assessment Note    Patient Details  Name: Meredith [name] MRN: [id number] Date of Birth: 18-May-1950  Transition of Care [facility]) CM/SW Contact:    Bartholomew Crews, RN Phone Number: [phone] 07/20/2019, 12:12 PM  Clinical Narrative:                  Spoke with patient at the bedside. PTA home alone with family support. Patient declines rehab facility, but agreeable to referral to [facility] program. Referral pending. Patient will need [place] orders for RN, PT, OT with Face to Face. TOC team following for transition needs.   Expected Discharge Plan: [place] Barriers to Discharge: Continued Medical Work up   Patient Goals and CMS Choice Patient states their goals for this hospitalization and ongoing recovery are:: return home CMS Medicare.gov Compare Post Acute Care list provided to:: Patient Choice offered to / list presented to : Patient  Expected Discharge Plan and Services Expected Discharge Plan: [place] In-house Referral: Clinical Social Work, [facility] Discharge Planning Services: CM Consult Post Acute Care Choice: [place] arrangements for the past 2 months: Apartment                           HH Arranged: RN, PT, OT, Nurse's Aide, Social Work [employer] Agency: [place] Date [place]: 07/20/19 Time [place]: 1211 Representative spoke with at [place] Arrangements/Services Living arrangements for the past 2 months: [place] with:: Self Patient language and need for interpreter reviewed:: Yes        Need for Family Participation in Patient Care: Yes (Comment) Care giver support system in place?: Yes (comment)   Criminal Activity/Legal Involvement Pertinent to Current Situation/Hospitalization: No - Comment as needed  Activities of Daily Living      Permission Sought/Granted                  Emotional  Assessment Appearance:: Appears stated age Attitude/Demeanor/Rapport: Engaged Affect (typically observed): Accepting Orientation: : Oriented to Self, Oriented to  Time, Oriented to Place, Oriented to Situation Alcohol / Substance Use: Not Applicable Psych Involvement: No (comment)  Admission diagnosis:  Dehydration [E86.0] Encephalopathy acute [G93.40] C. difficile diarrhea [A04.72] Diarrhea, unspecified type [R19.7] Patient Active Problem List   Diagnosis Date Noted  . C. difficile diarrhea 07/15/2019  . Chronic kidney disease, stage III (moderate) 07/15/2019  . Leukocytosis 07/15/2019  . Diabetes mellitus type 2, controlled ([place]) 07/15/2019  . Lithium toxicity 10/27/2018  . Acute encephalopathy   . Acute ischemic stroke ([place]) 10/22/2018  . Renal insufficiency 10/22/2018  . Pain and swelling of right lower leg 10/22/2018  . Hypothyroidism 02/24/2018  . Encounter for therapeutic drug monitoring 01/03/2018  . PTSD (post-traumatic stress disorder) 11/29/2017  . Falls 11/25/2017  . Disorientation   . Delirium 11/24/2017  . Stiffness of finger joint of right hand 06/18/2017  . Osteoarthritis of carpometacarpal (CMC) joint of thumb 05/21/2017  . Anxiety 11/29/2016  . Chronic pain syndrome 07/30/2016  . Bipolar disorder, curr episode mixed, severe, with psychotic features ([place]) 07/28/2016  . PAF (paroxysmal atrial fibrillation) ([place]) 11/14/2015  . Intractable chronic migraine without aura 04/24/2012   PCP:  Kristen Loader, FNP Pharmacy:   [facility], [place] [address] Phone: [phone] Fax: [phone]  [facility] [address] [place] [id number] [place] [place] [address] Phone: [phone] Fax: [place] [address], [place] [address] Phone:  [phone] Fax: [phone]  [id number] [place], [place] [address] Phone: [phone] Fax: [phone]     Social Determinants of Health (SDOH) Interventions    Readmission Risk Interventions No flowsheet data found.

## 2019-07-20 NOTE — TOC Transition Note (Signed)
Transition of Care [facility]) - CM/SW Discharge Note   Patient Details  Name: Meredith [name] MRN: [id number] Date of Birth: 28-May-1950  Transition of Care [facility]) CM/SW Contact:  Bartholomew Crews, RN Phone Number:  [phone] 07/20/2019, 3:49 PM   Clinical Narrative:     Patient not accepted for [facility] program, however, accepted for [facility] PT, OT, SW. MD notified for home health orders. No further TOC needs identified.   Final next level of care: [place] Barriers to Discharge: No Barriers Identified   Patient Goals and CMS Choice Patient states their goals for this hospitalization and ongoing recovery are:: return home CMS Medicare.gov Compare Post Acute Care list provided to:: Patient Choice offered to / list presented to : Patient  Discharge Placement                       Discharge Plan and Services In-house Referral: Clinical Social Work, [facility] Discharge Planning Services: CM Consult Post Acute Care Choice: Home Health          DME Arranged: N/A DME Agency: NA       HH Arranged: PT, OT, Social Work [employer] Agency: [place] Date [place]: 07/20/19 Time [place]: [id number] Representative spoke with at [place]: [place] ([place]) Interventions     Readmission Risk Interventions No flowsheet data found.

## 2019-07-20 NOTE — Progress Notes (Addendum)
Occupational Therapy Treatment Patient Details Name: Meredith [name] MRN: [id number] DOB: [birth year] Today's Date: 07/20/2019    History of present illness 69 year old female with history of bipolar disorder, HLD, anxiety  who presented with altered mental status. MRI brain: negative. EEG to evaluate for seizures: suggestive of cortical dysfunction in the left temporal region as well as mild diffuse encephalopathy. No seizures or epileptiform   OT comments  Patient very willing to participate and stated she was feeling good today and ready to leave.  Patient walked 228ft in hall with min guard and RW.  She was able to stand and dress/groom with supervision.  Processing and following multistep commands was [facility].  Patient performing well and has improved, discharge plan has been changed to [place].  Patient would benefit from further OT to increase activity tolerance and independence with ADLs.  Follow Up Recommendations  Home health OT;Supervision - Intermittent    Equipment Recommendations  3 in 1 bedside commode    Recommendations for Other Services      Precautions / Restrictions Precautions Precautions: Fall Restrictions Weight Bearing Restrictions: No       Mobility Bed Mobility Overal bed mobility: Needs Assistance Bed Mobility: Supine to Sit;Sit to Supine     Supine to sit: Supervision Sit to supine: Supervision      Transfers Overall transfer level: Needs assistance Equipment used: Rolling walker (2 wheeled) Transfers: Sit to/from Stand Sit to Stand: Min guard              Balance Overall balance assessment: Needs assistance;History of Falls Sitting-balance support: Feet supported Sitting balance-Leahy Scale: Good     Standing balance support: Bilateral upper extremity supported Standing balance-Leahy Scale: Poor Standing balance comment: reliant on UE support/external assist                            ADL either performed or assessed with  clinical judgement   ADL Overall ADL's : Needs assistance/impaired     Grooming: Standing;Oral care;Supervision/safety           Upper Body Dressing : Standing;Set up                   Functional mobility during ADLs: Min guard;Rolling walker       Vision       Perception     Praxis      Cognition Arousal/Alertness: Awake/alert Behavior During Therapy: WFL for tasks assessed/performed Overall Cognitive Status: Within Functional Limits for tasks assessed                                          Exercises Exercises: Other exercises Other Exercises Other Exercises: walk 219ft   Shoulder Instructions       General Comments      Pertinent Vitals/ Pain       Pain Assessment: No/denies pain  Home Living                                          Prior Functioning/Environment              Frequency  Min 2X/week        Progress Toward Goals  OT Goals(current goals can now be found in the care  plan section)  Progress towards OT goals: Progressing toward goals  Acute Rehab OT Goals Patient Stated Goal: regain her independence OT Goal Formulation: With patient Time For Goal Achievement: 08/01/19 Potential to Achieve Goals: Good  Plan Discharge plan needs to be updated    Co-evaluation                 AM-PAC OT "6 Clicks" Daily Activity     Outcome Measure   Help from another person eating meals?: None Help from another person taking care of personal grooming?: A Little Help from another person toileting, which includes using toliet, bedpan, or urinal?: A Little Help from another person bathing (including washing, rinsing, drying)?: A Little Help from another person to put on and taking off regular upper body clothing?: A Little Help from another person to put on and taking off regular lower body clothing?: A Little 6 Click Score: 19    End of Session Equipment Utilized During Treatment: Gait  belt;Rolling walker  OT Visit Diagnosis: Muscle weakness (generalized) (M62.81);Other abnormalities of gait and mobility (R26.89)   Activity Tolerance Patient tolerated treatment well   Patient Left in bed;with call bell/phone within reach;with bed alarm set   Nurse Communication Mobility status        Time: 1110-1134 OT Time Calculation (min): 24 min  Charges: OT General Charges $OT Visit: 1 Visit OT Treatments $Self Care/Home Management : 8-22 mins $Therapeutic Activity: 8-22 mins  August Luz, OTR/L    Meredith [name] 07/20/2019, 12:32 PM

## 2019-07-20 NOTE — Progress Notes (Signed)
DISCHARGE NOTE HOME [facility]A [facility] to be discharged Home per MD order. Discussed prescriptions and follow up appointments with the patient. Prescriptions given to patient; medication list explained in detail. Patient verbalized understanding.  Skin clean, dry and intact without evidence of skin break down, no evidence of skin tears noted. IV catheter discontinued intact. Site without signs and symptoms of complications. Dressing and pressure applied. Pt denies pain at the site currently. No complaints noted.  Patient free of lines, drains, and wounds.   An After Visit Summary (AVS) was printed and given to the patient. Patient escorted via wheelchair, and discharged home via private auto.  Dorthey Sawyer, RN

## 2019-07-21 DIAGNOSIS — E039 Hypothyroidism, unspecified: Secondary | ICD-10-CM | POA: Diagnosis not present

## 2019-07-21 DIAGNOSIS — D649 Anemia, unspecified: Secondary | ICD-10-CM | POA: Diagnosis not present

## 2019-07-21 DIAGNOSIS — E519 Thiamine deficiency, unspecified: Secondary | ICD-10-CM | POA: Diagnosis not present

## 2019-07-21 DIAGNOSIS — Z09 Encounter for follow-up examination after completed treatment for conditions other than malignant neoplasm: Secondary | ICD-10-CM | POA: Diagnosis not present

## 2019-07-21 DIAGNOSIS — Z7901 Long term (current) use of anticoagulants: Secondary | ICD-10-CM | POA: Diagnosis not present

## 2019-07-21 DIAGNOSIS — D6869 Other thrombophilia: Secondary | ICD-10-CM | POA: Diagnosis not present

## 2019-07-21 DIAGNOSIS — F3181 Bipolar II disorder: Secondary | ICD-10-CM | POA: Diagnosis not present

## 2019-07-21 DIAGNOSIS — E876 Hypokalemia: Secondary | ICD-10-CM | POA: Diagnosis not present

## 2019-07-21 DIAGNOSIS — E559 Vitamin D deficiency, unspecified: Secondary | ICD-10-CM | POA: Diagnosis not present

## 2019-07-21 DIAGNOSIS — N183 Chronic kidney disease, stage 3 unspecified: Secondary | ICD-10-CM | POA: Diagnosis not present

## 2019-07-21 DIAGNOSIS — N390 Urinary tract infection, site not specified: Secondary | ICD-10-CM | POA: Diagnosis not present

## 2019-07-22 ENCOUNTER — Encounter: Payer: Self-pay | Admitting: Psychiatry

## 2019-07-22 ENCOUNTER — Ambulatory Visit (INDEPENDENT_AMBULATORY_CARE_PROVIDER_SITE_OTHER): Payer: Medicare Other | Admitting: Psychiatry

## 2019-07-22 ENCOUNTER — Other Ambulatory Visit: Payer: Self-pay

## 2019-07-22 ENCOUNTER — Ambulatory Visit (INDEPENDENT_AMBULATORY_CARE_PROVIDER_SITE_OTHER): Payer: Medicare Other | Admitting: Psychology

## 2019-07-22 DIAGNOSIS — G9340 Encephalopathy, unspecified: Secondary | ICD-10-CM | POA: Diagnosis not present

## 2019-07-22 DIAGNOSIS — F4001 Agoraphobia with panic disorder: Secondary | ICD-10-CM | POA: Diagnosis not present

## 2019-07-22 DIAGNOSIS — G3184 Mild cognitive impairment, so stated: Secondary | ICD-10-CM

## 2019-07-22 DIAGNOSIS — F4310 Post-traumatic stress disorder, unspecified: Secondary | ICD-10-CM | POA: Diagnosis not present

## 2019-07-22 DIAGNOSIS — F3132 Bipolar disorder, current episode depressed, moderate: Secondary | ICD-10-CM | POA: Diagnosis not present

## 2019-07-22 DIAGNOSIS — F411 Generalized anxiety disorder: Secondary | ICD-10-CM

## 2019-07-22 DIAGNOSIS — F3181 Bipolar II disorder: Secondary | ICD-10-CM

## 2019-07-22 DIAGNOSIS — I639 Cerebral infarction, unspecified: Secondary | ICD-10-CM

## 2019-07-22 LAB — VITAMIN B1: Vitamin B1 (Thiamine): 125.6 nmol/L (ref 66.5–200.0)

## 2019-07-22 MED ORDER — ARIPIPRAZOLE 5 MG PO TABS: 5.0000 mg | ORAL_TABLET | Freq: Every day | ORAL | 1 refills | Status: DC

## 2019-07-22 MED ORDER — LITHIUM CARBONATE 300 MG PO CAPS: 300.0000 mg | ORAL_CAPSULE | Freq: Every day | ORAL | 1 refills | Status: DC

## 2019-07-22 MED ORDER — QUETIAPINE FUMARATE 100 MG PO TABS: 100.0000 mg | ORAL_TABLET | Freq: Every day | ORAL | 1 refills | Status: DC

## 2019-07-22 NOTE — Patient Instructions (Signed)
Change quetiapine to 100 mg at bedtime Stop buspirone Start Abilify (aripiprazole) 5 mg each morning
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
## 2019-07-23 DIAGNOSIS — M47814 Spondylosis without myelopathy or radiculopathy, thoracic region: Secondary | ICD-10-CM | POA: Diagnosis not present

## 2019-07-23 DIAGNOSIS — G894 Chronic pain syndrome: Secondary | ICD-10-CM | POA: Diagnosis not present

## 2019-07-23 DIAGNOSIS — M797 Fibromyalgia: Secondary | ICD-10-CM | POA: Diagnosis not present

## 2019-07-23 DIAGNOSIS — M47816 Spondylosis without myelopathy or radiculopathy, lumbar region: Secondary | ICD-10-CM | POA: Diagnosis not present

## 2019-07-24 ENCOUNTER — Ambulatory Visit (INDEPENDENT_AMBULATORY_CARE_PROVIDER_SITE_OTHER): Payer: Medicare Other | Admitting: *Deleted

## 2019-07-24 ENCOUNTER — Other Ambulatory Visit: Payer: Self-pay

## 2019-07-24 DIAGNOSIS — Z5181 Encounter for therapeutic drug level monitoring: Secondary | ICD-10-CM

## 2019-07-24 DIAGNOSIS — I480 Paroxysmal atrial fibrillation: Secondary | ICD-10-CM

## 2019-07-24 LAB — POCT INR: INR: 1.8 — AB (ref 2.0–3.0)

## 2019-07-24 NOTE — Patient Instructions (Addendum)
Description   Since you have been taken 6mg  post hospital discharge resume using Warfarin 2.5mg : 1.5 tablets daily except for 2 tablets on Mondays. Please resume adding leafy veggies to diet Recheck INR in 5-7 days. Call with any medication changes. [facility] [phone] Main [phone]

## 2019-07-31 ENCOUNTER — Other Ambulatory Visit: Payer: Self-pay

## 2019-07-31 ENCOUNTER — Ambulatory Visit (INDEPENDENT_AMBULATORY_CARE_PROVIDER_SITE_OTHER): Payer: Medicare Other | Admitting: *Deleted

## 2019-07-31 DIAGNOSIS — I480 Paroxysmal atrial fibrillation: Secondary | ICD-10-CM

## 2019-07-31 DIAGNOSIS — Z5181 Encounter for therapeutic drug level monitoring: Secondary | ICD-10-CM | POA: Diagnosis not present

## 2019-07-31 LAB — POCT INR: INR: 2.6 (ref 2.0–3.0)

## 2019-07-31 NOTE — Patient Instructions (Addendum)
Description   Continue taking Warfarin 1.5 tablets daily except for 2 tablets on Mondays. Please continue leafy veggies in your diet. Recheck INR in 2 weeks. Call with any medication changes. [facility] [phone] Main [phone]

## 2019-08-04 ENCOUNTER — Other Ambulatory Visit: Payer: Self-pay

## 2019-08-04 ENCOUNTER — Ambulatory Visit (INDEPENDENT_AMBULATORY_CARE_PROVIDER_SITE_OTHER): Payer: Medicare Other | Admitting: Psychiatry

## 2019-08-04 ENCOUNTER — Encounter: Payer: Self-pay | Admitting: Psychiatry

## 2019-08-04 DIAGNOSIS — I639 Cerebral infarction, unspecified: Secondary | ICD-10-CM

## 2019-08-04 DIAGNOSIS — F4001 Agoraphobia with panic disorder: Secondary | ICD-10-CM

## 2019-08-04 DIAGNOSIS — G3184 Mild cognitive impairment, so stated: Secondary | ICD-10-CM | POA: Diagnosis not present

## 2019-08-04 DIAGNOSIS — Z79899 Other long term (current) drug therapy: Secondary | ICD-10-CM

## 2019-08-04 DIAGNOSIS — F411 Generalized anxiety disorder: Secondary | ICD-10-CM

## 2019-08-04 DIAGNOSIS — F5105 Insomnia due to other mental disorder: Secondary | ICD-10-CM

## 2019-08-04 DIAGNOSIS — F3181 Bipolar II disorder: Secondary | ICD-10-CM

## 2019-08-04 DIAGNOSIS — F4310 Post-traumatic stress disorder, unspecified: Secondary | ICD-10-CM | POA: Diagnosis not present

## 2019-08-04 DIAGNOSIS — F3132 Bipolar disorder, current episode depressed, moderate: Secondary | ICD-10-CM

## 2019-08-04 MED ORDER — ARIPIPRAZOLE 10 MG PO TABS: 10.0000 mg | ORAL_TABLET | Freq: Every day | ORAL | 1 refills | Status: DC

## 2019-08-04 MED ORDER — LITHIUM CARBONATE 150 MG PO CAPS: 150.0000 mg | ORAL_CAPSULE | Freq: Every day | ORAL | 0 refills | Status: DC

## 2019-08-04 NOTE — Patient Instructions (Signed)
Stop buspirone.  Increase Abilify (aripiprazole) to 10 mg each morning for bipolar depression and mood stability
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
## 2019-08-05 ENCOUNTER — Ambulatory Visit (INDEPENDENT_AMBULATORY_CARE_PROVIDER_SITE_OTHER): Payer: Medicare Other | Admitting: Psychology

## 2019-08-05 DIAGNOSIS — F3181 Bipolar II disorder: Secondary | ICD-10-CM

## 2019-08-06 ENCOUNTER — Ambulatory Visit (INDEPENDENT_AMBULATORY_CARE_PROVIDER_SITE_OTHER): Payer: Medicare Other | Admitting: Podiatry

## 2019-08-06 ENCOUNTER — Other Ambulatory Visit: Payer: Self-pay

## 2019-08-06 ENCOUNTER — Ambulatory Visit (INDEPENDENT_AMBULATORY_CARE_PROVIDER_SITE_OTHER): Payer: Medicare Other

## 2019-08-06 DIAGNOSIS — G518 Other disorders of facial nerve: Secondary | ICD-10-CM | POA: Diagnosis not present

## 2019-08-06 DIAGNOSIS — M2021 Hallux rigidus, right foot: Secondary | ICD-10-CM

## 2019-08-06 DIAGNOSIS — M19079 Primary osteoarthritis, unspecified ankle and foot: Secondary | ICD-10-CM

## 2019-08-06 DIAGNOSIS — G43719 Chronic migraine without aura, intractable, without status migrainosus: Secondary | ICD-10-CM | POA: Diagnosis not present

## 2019-08-06 DIAGNOSIS — M791 Myalgia, unspecified site: Secondary | ICD-10-CM | POA: Diagnosis not present

## 2019-08-06 DIAGNOSIS — M2031 Hallux varus (acquired), right foot: Secondary | ICD-10-CM

## 2019-08-06 DIAGNOSIS — M542 Cervicalgia: Secondary | ICD-10-CM | POA: Diagnosis not present

## 2019-08-06 NOTE — Progress Notes (Signed)
  Subjective:  Patient ID: Meredith [name], female    DOB: 06-07-1950,  MRN: [id number]  Chief Complaint  Patient presents with  . Toe Pain    Pt states right 1st toe is increasing in size over 1 yr. Pt states it is hard with occasional numbness, pain with pressure/walking. No known injuries.    69 y.o. female presents with the above complaint. History confirmed with patient.   Objective:  Physical Exam: warm, good capillary refill, no trophic changes or ulcerative lesions, normal DP and PT pulses and normal sensory exam. Right Foot: Pain on ROM R 1st MPJ with dorsal exostosis, hallux malleus deformity with POP   No images are attached to the encounter.  Radiographs: X-ray of the right foot: 1st MPJ and hallux IPJ DJD decreased joint space  Assessment:   1. Hallux rigidus of right foot   2. Hallux malleus of right foot   3. Arthritis of big toe      Plan:  Patient was evaluated and treated and all questions answered.  1st MPJ Arthritis, Hallux Malleus with Arthritis -Educated on etiology -XR reviewed with patient -Injection delivered to the painful joint -Discussed possible 1st MPJ replacement, Hallux arthroplasty at a later date  Procedure: Joint Injection Location: Right 1st MPJ, 1st IPJ joint Skin Prep: Alcohol. Injectate: 0.5 cc 1% lidocaine plain, 0.5 cc dexamethasone phosphate. Disposition: Patient tolerated procedure well. Injection site dressed with a band-aid.   Return in about 1 month (around 09/05/2019) for Arthritis, Right Great toe.

## 2019-08-10 ENCOUNTER — Other Ambulatory Visit: Payer: Self-pay

## 2019-08-10 ENCOUNTER — Ambulatory Visit
Admission: RE | Admit: 2019-08-10 | Discharge: 2019-08-10 | Disposition: A | Discharge status: Home / Self Care | Payer: Medicare Other | Source: Ambulatory Visit | Attending: Family Medicine | Admitting: Family Medicine

## 2019-08-10 DIAGNOSIS — Z1231 Encounter for screening mammogram for malignant neoplasm of breast: Secondary | ICD-10-CM | POA: Diagnosis not present

## 2019-08-14 ENCOUNTER — Other Ambulatory Visit: Payer: Self-pay

## 2019-08-14 ENCOUNTER — Ambulatory Visit (INDEPENDENT_AMBULATORY_CARE_PROVIDER_SITE_OTHER): Payer: Medicare Other | Admitting: *Deleted

## 2019-08-14 DIAGNOSIS — Z5181 Encounter for therapeutic drug level monitoring: Secondary | ICD-10-CM | POA: Diagnosis not present

## 2019-08-14 DIAGNOSIS — I480 Paroxysmal atrial fibrillation: Secondary | ICD-10-CM

## 2019-08-14 LAB — POCT INR: INR: 2.6 (ref 2.0–3.0)

## 2019-08-14 NOTE — Patient Instructions (Signed)
Description   Continue taking Warfarin 1.5 tablets daily except for 2 tablets on Mondays. Please continue leafy veggies in your diet. Recheck INR in 3 weeks. Call with any medication changes. [facility] [phone] Main [phone]

## 2019-08-19 ENCOUNTER — Ambulatory Visit (INDEPENDENT_AMBULATORY_CARE_PROVIDER_SITE_OTHER): Payer: Medicare Other | Admitting: Psychology

## 2019-08-19 DIAGNOSIS — F3181 Bipolar II disorder: Secondary | ICD-10-CM

## 2019-08-24 ENCOUNTER — Other Ambulatory Visit: Payer: Self-pay | Admitting: Psychiatry

## 2019-08-24 DIAGNOSIS — F3131 Bipolar disorder, current episode depressed, mild: Secondary | ICD-10-CM

## 2019-08-27 DIAGNOSIS — Z79899 Other long term (current) drug therapy: Secondary | ICD-10-CM | POA: Diagnosis not present

## 2019-08-27 DIAGNOSIS — Z79891 Long term (current) use of opiate analgesic: Secondary | ICD-10-CM | POA: Diagnosis not present

## 2019-08-27 DIAGNOSIS — M47816 Spondylosis without myelopathy or radiculopathy, lumbar region: Secondary | ICD-10-CM | POA: Diagnosis not present

## 2019-08-27 DIAGNOSIS — G894 Chronic pain syndrome: Secondary | ICD-10-CM | POA: Diagnosis not present

## 2019-08-27 DIAGNOSIS — M47814 Spondylosis without myelopathy or radiculopathy, thoracic region: Secondary | ICD-10-CM | POA: Diagnosis not present

## 2019-08-27 DIAGNOSIS — M797 Fibromyalgia: Secondary | ICD-10-CM | POA: Diagnosis not present

## 2019-09-02 ENCOUNTER — Ambulatory Visit (INDEPENDENT_AMBULATORY_CARE_PROVIDER_SITE_OTHER): Payer: Medicare Other | Admitting: Psychology

## 2019-09-02 DIAGNOSIS — F3181 Bipolar II disorder: Secondary | ICD-10-CM | POA: Diagnosis not present

## 2019-09-03 DIAGNOSIS — M791 Myalgia, unspecified site: Secondary | ICD-10-CM | POA: Diagnosis not present

## 2019-09-03 DIAGNOSIS — G518 Other disorders of facial nerve: Secondary | ICD-10-CM | POA: Diagnosis not present

## 2019-09-03 DIAGNOSIS — M542 Cervicalgia: Secondary | ICD-10-CM | POA: Diagnosis not present

## 2019-09-03 DIAGNOSIS — G43719 Chronic migraine without aura, intractable, without status migrainosus: Secondary | ICD-10-CM | POA: Diagnosis not present

## 2019-09-04 ENCOUNTER — Ambulatory Visit (INDEPENDENT_AMBULATORY_CARE_PROVIDER_SITE_OTHER): Payer: Medicare Other

## 2019-09-04 ENCOUNTER — Other Ambulatory Visit: Payer: Self-pay

## 2019-09-04 DIAGNOSIS — Z5181 Encounter for therapeutic drug level monitoring: Secondary | ICD-10-CM | POA: Diagnosis not present

## 2019-09-04 DIAGNOSIS — I480 Paroxysmal atrial fibrillation: Secondary | ICD-10-CM

## 2019-09-04 LAB — POCT INR: INR: 2.1 (ref 2.0–3.0)

## 2019-09-04 NOTE — Patient Instructions (Signed)
Continue taking Warfarin 1.5 tablets daily except for 2 tablets on Mondays. Please continue leafy veggies in your diet. Recheck INR in 4 weeks. Call with any medication changes. [facility] [phone] Main [phone]

## 2019-09-10 ENCOUNTER — Other Ambulatory Visit: Payer: Self-pay | Admitting: Psychiatry

## 2019-09-10 DIAGNOSIS — F3132 Bipolar disorder, current episode depressed, moderate: Secondary | ICD-10-CM

## 2019-09-13 DIAGNOSIS — R319 Hematuria, unspecified: Secondary | ICD-10-CM | POA: Diagnosis not present

## 2019-09-13 DIAGNOSIS — N390 Urinary tract infection, site not specified: Secondary | ICD-10-CM | POA: Diagnosis not present

## 2019-09-16 ENCOUNTER — Ambulatory Visit (INDEPENDENT_AMBULATORY_CARE_PROVIDER_SITE_OTHER): Payer: Medicare Other | Admitting: Psychology

## 2019-09-16 DIAGNOSIS — F3181 Bipolar II disorder: Secondary | ICD-10-CM | POA: Diagnosis not present

## 2019-09-24 DIAGNOSIS — M797 Fibromyalgia: Secondary | ICD-10-CM | POA: Diagnosis not present

## 2019-09-24 DIAGNOSIS — M47814 Spondylosis without myelopathy or radiculopathy, thoracic region: Secondary | ICD-10-CM | POA: Diagnosis not present

## 2019-09-24 DIAGNOSIS — G894 Chronic pain syndrome: Secondary | ICD-10-CM | POA: Diagnosis not present

## 2019-09-24 DIAGNOSIS — M47816 Spondylosis without myelopathy or radiculopathy, lumbar region: Secondary | ICD-10-CM | POA: Diagnosis not present

## 2019-09-25 DIAGNOSIS — N3001 Acute cystitis with hematuria: Secondary | ICD-10-CM | POA: Diagnosis not present

## 2019-09-29 DIAGNOSIS — G518 Other disorders of facial nerve: Secondary | ICD-10-CM | POA: Diagnosis not present

## 2019-09-29 DIAGNOSIS — G43719 Chronic migraine without aura, intractable, without status migrainosus: Secondary | ICD-10-CM | POA: Diagnosis not present

## 2019-09-29 DIAGNOSIS — M542 Cervicalgia: Secondary | ICD-10-CM | POA: Diagnosis not present

## 2019-09-29 DIAGNOSIS — M791 Myalgia, unspecified site: Secondary | ICD-10-CM | POA: Diagnosis not present

## 2019-09-30 ENCOUNTER — Ambulatory Visit: Payer: Medicare Other | Admitting: Psychiatry

## 2019-09-30 ENCOUNTER — Ambulatory Visit (INDEPENDENT_AMBULATORY_CARE_PROVIDER_SITE_OTHER): Payer: Medicare Other | Admitting: Psychology

## 2019-09-30 DIAGNOSIS — F3181 Bipolar II disorder: Secondary | ICD-10-CM | POA: Diagnosis not present

## 2019-10-01 ENCOUNTER — Other Ambulatory Visit: Payer: Self-pay

## 2019-10-01 ENCOUNTER — Ambulatory Visit (INDEPENDENT_AMBULATORY_CARE_PROVIDER_SITE_OTHER): Payer: Medicare Other | Admitting: Psychiatry

## 2019-10-01 ENCOUNTER — Encounter: Payer: Self-pay | Admitting: Psychiatry

## 2019-10-01 DIAGNOSIS — F411 Generalized anxiety disorder: Secondary | ICD-10-CM | POA: Diagnosis not present

## 2019-10-01 DIAGNOSIS — F3132 Bipolar disorder, current episode depressed, moderate: Secondary | ICD-10-CM

## 2019-10-01 DIAGNOSIS — I639 Cerebral infarction, unspecified: Secondary | ICD-10-CM

## 2019-10-01 DIAGNOSIS — G3184 Mild cognitive impairment, so stated: Secondary | ICD-10-CM | POA: Diagnosis not present

## 2019-10-01 DIAGNOSIS — F4310 Post-traumatic stress disorder, unspecified: Secondary | ICD-10-CM | POA: Diagnosis not present

## 2019-10-01 DIAGNOSIS — F5105 Insomnia due to other mental disorder: Secondary | ICD-10-CM

## 2019-10-01 DIAGNOSIS — F4001 Agoraphobia with panic disorder: Secondary | ICD-10-CM

## 2019-10-01 DIAGNOSIS — Z79899 Other long term (current) drug therapy: Secondary | ICD-10-CM | POA: Diagnosis not present

## 2019-10-01 MED ORDER — ARIPIPRAZOLE 20 MG PO TABS: 20.0000 mg | ORAL_TABLET | Freq: Every day | ORAL | 1 refills | Status: DC

## 2019-10-01 MED ORDER — QUETIAPINE FUMARATE 200 MG PO TABS: 200.0000 mg | ORAL_TABLET | Freq: Every day | ORAL | 1 refills | Status: DC

## 2019-10-01 NOTE — Patient Instructions (Signed)
Increase aripiprazole to 20 mg daily (can take 2 of the 10 mg tablets daily or 1 of the 20 mg tablets)  Increase quetiapine to 200 mg for sleep
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
## 2019-10-02 ENCOUNTER — Ambulatory Visit (INDEPENDENT_AMBULATORY_CARE_PROVIDER_SITE_OTHER): Payer: Medicare Other

## 2019-10-02 ENCOUNTER — Other Ambulatory Visit: Payer: Self-pay

## 2019-10-02 DIAGNOSIS — I480 Paroxysmal atrial fibrillation: Secondary | ICD-10-CM

## 2019-10-02 DIAGNOSIS — Z5181 Encounter for therapeutic drug level monitoring: Secondary | ICD-10-CM

## 2019-10-02 LAB — POCT INR: INR: 2.3 (ref 2.0–3.0)

## 2019-10-02 NOTE — Patient Instructions (Signed)
Description   Continue taking Warfarin 1.5 tablets daily except for 2 tablets on Mondays. Please continue leafy veggies in your diet. Recheck INR in 6 weeks. Call with any medication changes. [facility] [phone] Main [phone]

## 2019-10-06 ENCOUNTER — Ambulatory Visit (INDEPENDENT_AMBULATORY_CARE_PROVIDER_SITE_OTHER): Payer: Medicare Other | Admitting: Podiatry

## 2019-10-06 ENCOUNTER — Other Ambulatory Visit: Payer: Self-pay

## 2019-10-06 DIAGNOSIS — M2021 Hallux rigidus, right foot: Secondary | ICD-10-CM | POA: Diagnosis not present

## 2019-10-06 DIAGNOSIS — I480 Paroxysmal atrial fibrillation: Secondary | ICD-10-CM | POA: Diagnosis not present

## 2019-10-06 DIAGNOSIS — M19079 Primary osteoarthritis, unspecified ankle and foot: Secondary | ICD-10-CM

## 2019-10-06 DIAGNOSIS — N183 Chronic kidney disease, stage 3 unspecified: Secondary | ICD-10-CM | POA: Diagnosis not present

## 2019-10-06 DIAGNOSIS — M2031 Hallux varus (acquired), right foot: Secondary | ICD-10-CM | POA: Diagnosis not present

## 2019-10-06 NOTE — Patient Instructions (Signed)
Pre-Operative Instructions  Congratulations, you have decided to take an important step towards improving your quality of life.  You can be assured that the doctors and staff at [facility] will be with you every step of the way.  Here are some important things you should know:  1. Plan to be at the surgery center/hospital at least 1 (one) hour prior to your scheduled time, unless otherwise directed by the surgical center/hospital staff.  You must have a responsible adult accompany you, remain during the surgery and drive you home.  Make sure you have directions to the surgical center/hospital to ensure you arrive on time. 2. If you are having surgery at [facility] or [facility], you will need a copy of your medical history and physical form from your family physician within one month prior to the date of surgery. We will give you a form for your primary physician to complete.  3. We make every effort to accommodate the date you request for surgery.  However, there are times where surgery dates or times have to be moved.  We will contact you as soon as possible if a change in schedule is required.   4. No aspirin/ibuprofen for one week before surgery.  If you are on aspirin, any non-steroidal anti-inflammatory medications (Mobic, Aleve, Ibuprofen) should not be taken seven (7) days prior to your surgery.  You make take Tylenol for pain prior to surgery.  5. Medications - If you are taking daily heart and blood pressure medications, seizure, reflux, allergy, asthma, anxiety, pain or diabetes medications, make sure you notify the surgery center/hospital before the day of surgery so they can tell you which medications you should take or avoid the day of surgery. 6. No food or drink after midnight the night before surgery unless directed otherwise by surgical center/hospital staff. 7. No alcoholic beverages 24-hours prior to surgery.  No smoking 24-hours prior or 24-hours after  surgery. 8. Wear loose pants or shorts. They should be loose enough to fit over bandages, boots, and casts. 9. Don't wear slip-on shoes. Sneakers are preferred. 10. Bring your boot with you to the surgery center/hospital.  Also bring crutches or a walker if your physician has prescribed it for you.  If you do not have this equipment, it will be provided for you after surgery. 11. If you have not been contacted by the surgery center/hospital by the day before your surgery, call to confirm the date and time of your surgery. 12. Leave-time from work may vary depending on the type of surgery you have.  Appropriate arrangements should be made prior to surgery with your employer. 13. Prescriptions will be provided immediately following surgery by your doctor.  Fill these as soon as possible after surgery and take the medication as directed. Pain medications will not be refilled on weekends and must be approved by the doctor. 14. Remove nail polish on the operative foot and avoid getting pedicures prior to surgery. 15. Wash the night before surgery.  The night before surgery wash the foot and leg well with water and the antibacterial soap provided. Be sure to pay special attention to beneath the toenails and in between the toes.  Wash for at least three (3) minutes. Rinse thoroughly with water and dry well with a towel.  Perform this wash unless told not to do so by your physician.  Enclosed: 1 Ice pack (please put in freezer the night before surgery)   1 Hibiclens skin cleaner     Pre-op instructions  If you have any questions regarding the instructions, please do not hesitate to call our office.  [place]: [address], [place], [place] [address] -- [phone]  [place]: [address] , [place] [address] -- [phone]  [place]: [address], [place], [place] [address] -- [phone]   Website: [email or web address] 

## 2019-10-06 NOTE — Progress Notes (Signed)
  Subjective:  Patient ID: Meredith [name], female    DOB: 20-Aug-1950,  MRN: [id number]  Chief Complaint  Patient presents with  . Arthritis    Pt states injections ineffective, has increased swelling and pain.   69 y.o. female presents with the above complaint. History confirmed with patient. States the injections hurt more than they helped, and it is getting so bad she cannot wear certain shoes.  Objective:  Physical Exam: warm, good capillary refill, no trophic changes or ulcerative lesions, normal DP and PT pulses and normal sensory exam. Right Foot: Pain on ROM R 1st MPJ with dorsal exostosis, hallux malleus deformity with POP   No images are attached to the encounter.  Radiographs: X-ray of the right foot: 1st MPJ and hallux IPJ DJD decreased joint space  Assessment:   1. Hallux rigidus of right foot   2. Hallux malleus of right foot   3. Arthritis of big toe   4. PAF (paroxysmal atrial fibrillation) (HCC)   5. Stage 3 chronic kidney disease, unspecified whether stage 3a or 3b CKD    Plan:  Patient was evaluated and treated and all questions answered.  1st MPJ Arthritis, Hallux Malleus with Arthritis -Failed injection therapy, shoe gear changes. -Patient has failed all conservative therapy and wishes to proceed with surgical intervention. All risks, benefits, and alternatives discussed with patient. No guarantees given. Consent reviewed and signed by patient. -Boot dispensed for post-op use. -Planned procedures: Right hallux 1st MPJ replacement, hallux IPJ arthroplasty -Advised to discuss with her PCP about stopping her Coumadin.  -Risk factors: chronic afib on Coumadin, Stage 3 CKD  No follow-ups on file.

## 2019-10-12 ENCOUNTER — Telehealth: Payer: Self-pay | Admitting: Pharmacist

## 2019-10-12 ENCOUNTER — Telehealth: Payer: Self-pay | Admitting: Podiatry

## 2019-10-12 NOTE — Telephone Encounter (Signed)
Patient called stating she is having a foot surgery on 9/15. Wants to reschedule coumadin appointment. No formal clearance has been requested.   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    Patient will NOT need bridging with Lovenox (enoxaparin) around procedure.

## 2019-10-12 NOTE — Telephone Encounter (Signed)
I just talked to the [facility] and my cardiologist is Dr. Acie Fredrickson. They said that they need to get paperwork from you guys for cardiac clearance. They said it was required before sx could be done. Give me a call if you need any other information. Thank you.

## 2019-10-12 NOTE — Telephone Encounter (Signed)
Pt will call foot surgeon's office and have them fax over cardiac clearance request

## 2019-10-13 ENCOUNTER — Telehealth: Payer: Self-pay | Admitting: *Deleted

## 2019-10-13 DIAGNOSIS — Z01818 Encounter for other preprocedural examination: Secondary | ICD-10-CM | POA: Diagnosis not present

## 2019-10-13 DIAGNOSIS — N183 Chronic kidney disease, stage 3 unspecified: Secondary | ICD-10-CM | POA: Diagnosis not present

## 2019-10-13 DIAGNOSIS — M19079 Primary osteoarthritis, unspecified ankle and foot: Secondary | ICD-10-CM | POA: Diagnosis not present

## 2019-10-13 DIAGNOSIS — D6869 Other thrombophilia: Secondary | ICD-10-CM | POA: Diagnosis not present

## 2019-10-13 DIAGNOSIS — M2031 Hallux varus (acquired), right foot: Secondary | ICD-10-CM | POA: Diagnosis not present

## 2019-10-13 DIAGNOSIS — E039 Hypothyroidism, unspecified: Secondary | ICD-10-CM | POA: Diagnosis not present

## 2019-10-13 DIAGNOSIS — F3181 Bipolar II disorder: Secondary | ICD-10-CM | POA: Diagnosis not present

## 2019-10-13 DIAGNOSIS — Z7901 Long term (current) use of anticoagulants: Secondary | ICD-10-CM | POA: Diagnosis not present

## 2019-10-13 DIAGNOSIS — I480 Paroxysmal atrial fibrillation: Secondary | ICD-10-CM | POA: Diagnosis not present

## 2019-10-13 DIAGNOSIS — M8580 Other specified disorders of bone density and structure, unspecified site: Secondary | ICD-10-CM | POA: Diagnosis not present

## 2019-10-13 DIAGNOSIS — D649 Anemia, unspecified: Secondary | ICD-10-CM | POA: Diagnosis not present

## 2019-10-13 DIAGNOSIS — M2021 Hallux rigidus, right foot: Secondary | ICD-10-CM | POA: Diagnosis not present

## 2019-10-13 DIAGNOSIS — Z5181 Encounter for therapeutic drug level monitoring: Secondary | ICD-10-CM | POA: Diagnosis not present

## 2019-10-13 DIAGNOSIS — G43009 Migraine without aura, not intractable, without status migrainosus: Secondary | ICD-10-CM | POA: Diagnosis not present

## 2019-10-13 NOTE — Telephone Encounter (Signed)
"  I just talked to the Coumadin clinic.  My Cardiologist is Dr. Elza Rafter.  They said they need to get paperwork from you guys for a Cardiac clearance that they can do.  They said it's required before surgery could be done.  Give me a call if you need anymore information or if you need to contact me."

## 2019-10-14 ENCOUNTER — Ambulatory Visit (INDEPENDENT_AMBULATORY_CARE_PROVIDER_SITE_OTHER): Payer: Medicare Other | Admitting: Psychology

## 2019-10-14 DIAGNOSIS — F3181 Bipolar II disorder: Secondary | ICD-10-CM

## 2019-10-15 NOTE — Telephone Encounter (Signed)
I have sx coming up in September and I need two things from you. The first is that he gave me a diagram when I was there to schedule my sx but they didn't give that to me when I checked out. I would really like to have that diagram of my toe mailed to me if possible at my address on my records. Also, I talked to my Coumadin clinic, they said they sent back the clearance, cardiac clearance to you guys. I need to know if you need to have one sent from my cardiologist or not. I don't think its necessary, but, if you need, I need to do something about it or you need to send it to Dr. Cathie Olden. Thank you very much. Bye bye.

## 2019-10-19 NOTE — Telephone Encounter (Signed)
I've called twice and have not gotten a call back yet. I have sx in September with Dr. March Rummage. I spoke to the [facility] and heart care under Dr. Acie Fredrickson and they want a paper so they can give cardiac clearance. I also wanted a copy of the diagram of the sx he is going to do. That can be sent to my home. Please call me as soon as you can. Thank you. Bye bye.

## 2019-10-19 NOTE — Telephone Encounter (Addendum)
Called pt to let her know I've received her messages and apologize for just calling back, but have been catching work up for [employer]. Told her we would get a letter sent to Dr. Elmarie Shiley office at the [facility] prior to her sx. Told pt I would also mail her a copy of the diagram from the consent forms that she requested. Pt also wanted to know when someone from the sx center would be calling to register her on the phone to get her medications and let her know what or if any she should stop. I told her they should be calling soon but if she hasn't heard anything from them by 9/1 or 9/2 to call us back and we would make sure they call her.

## 2019-10-22 ENCOUNTER — Other Ambulatory Visit: Payer: Self-pay

## 2019-10-22 ENCOUNTER — Ambulatory Visit (INDEPENDENT_AMBULATORY_CARE_PROVIDER_SITE_OTHER): Payer: Medicare Other | Admitting: Psychiatry

## 2019-10-22 ENCOUNTER — Encounter: Payer: Self-pay | Admitting: Psychiatry

## 2019-10-22 DIAGNOSIS — I639 Cerebral infarction, unspecified: Secondary | ICD-10-CM

## 2019-10-22 DIAGNOSIS — F3131 Bipolar disorder, current episode depressed, mild: Secondary | ICD-10-CM | POA: Diagnosis not present

## 2019-10-22 DIAGNOSIS — G3184 Mild cognitive impairment, so stated: Secondary | ICD-10-CM | POA: Diagnosis not present

## 2019-10-22 DIAGNOSIS — Z79899 Other long term (current) drug therapy: Secondary | ICD-10-CM

## 2019-10-22 DIAGNOSIS — F5105 Insomnia due to other mental disorder: Secondary | ICD-10-CM

## 2019-10-22 DIAGNOSIS — F4001 Agoraphobia with panic disorder: Secondary | ICD-10-CM

## 2019-10-22 DIAGNOSIS — F4310 Post-traumatic stress disorder, unspecified: Secondary | ICD-10-CM | POA: Diagnosis not present

## 2019-10-22 DIAGNOSIS — F3181 Bipolar II disorder: Secondary | ICD-10-CM | POA: Diagnosis not present

## 2019-10-22 DIAGNOSIS — F411 Generalized anxiety disorder: Secondary | ICD-10-CM | POA: Diagnosis not present

## 2019-10-22 DIAGNOSIS — F3132 Bipolar disorder, current episode depressed, moderate: Secondary | ICD-10-CM

## 2019-10-22 MED ORDER — LITHIUM CARBONATE 150 MG PO CAPS: 150.0000 mg | ORAL_CAPSULE | Freq: Every day | ORAL | 0 refills | Status: DC

## 2019-10-22 MED ORDER — LAMOTRIGINE 100 MG PO TABS: 100.0000 mg | ORAL_TABLET | Freq: Two times a day (BID) | ORAL | 1 refills | Status: DC

## 2019-10-22 MED ORDER — ARIPIPRAZOLE 30 MG PO TABS: 30.0000 mg | ORAL_TABLET | Freq: Every day | ORAL | 1 refills | Status: DC
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
## 2019-10-23 DIAGNOSIS — M47814 Spondylosis without myelopathy or radiculopathy, thoracic region: Secondary | ICD-10-CM | POA: Diagnosis not present

## 2019-10-23 DIAGNOSIS — M47816 Spondylosis without myelopathy or radiculopathy, lumbar region: Secondary | ICD-10-CM | POA: Diagnosis not present

## 2019-10-23 DIAGNOSIS — M797 Fibromyalgia: Secondary | ICD-10-CM | POA: Diagnosis not present

## 2019-10-23 DIAGNOSIS — G894 Chronic pain syndrome: Secondary | ICD-10-CM | POA: Diagnosis not present

## 2019-10-27 ENCOUNTER — Telehealth: Payer: Self-pay | Admitting: Cardiovascular Disease

## 2019-10-27 DIAGNOSIS — G43719 Chronic migraine without aura, intractable, without status migrainosus: Secondary | ICD-10-CM | POA: Diagnosis not present

## 2019-10-27 DIAGNOSIS — M542 Cervicalgia: Secondary | ICD-10-CM | POA: Diagnosis not present

## 2019-10-27 DIAGNOSIS — M791 Myalgia, unspecified site: Secondary | ICD-10-CM | POA: Diagnosis not present

## 2019-10-27 DIAGNOSIS — G518 Other disorders of facial nerve: Secondary | ICD-10-CM | POA: Diagnosis not present

## 2019-10-27 NOTE — Telephone Encounter (Signed)
    [facility] Pre-operative Risk Assessment    HEARTCARE STAFF: - Please ensure there is not already an duplicate clearance open for this procedure. - Under Visit Info/Reason for Call, type in Other and utilize the format Clearance MM/DD/YY or Clearance TBD. Do not use dashes or single digits. - If request is for dental extraction, please clarify the # of teeth to be extracted.  Request for surgical clearance:  1. What type of surgery is being performed? Keller bunion implant right foot hammer toe repair right foot  2. When is this surgery scheduled? 11/11/19  3. What type of clearance is required (medical clearance vs. Pharmacy clearance to hold med vs. Both)? Both   4. Are there any medications that need to be held prior to surgery and how long? Not sure whatever we need to hold.   5. Practice name and name of physician performing surgery? Dr. Legrand Como [facility]  6. What is the office phone number? [phone]   7.   What is the office fax number? [phone]  8.   Anesthesia type (None, local, MAC, general) ? general   Meredith [name] 10/27/2019, 10:14 AM  _________________________________________________________________   (provider comments below)

## 2019-10-27 NOTE — Telephone Encounter (Signed)
   Primary Cardiologist: Mertie Moores, MD  Chart reviewed as part of pre-operative protocol coverage. Patient was contacted 10/27/2019 in reference to pre-operative risk assessment for pending surgery as outlined below.  Dulcie Gammon was last seen on 01/29/19 by Dr. Acie Fredrickson. H/o PAF, HLD, hypothyroidism, migraines, CKD, bipolar d/o, depression. LVEF normal by echo 09/2018. RCRI 0.4% indicating low risk of CV complications and she denies any new cardiac symptoms. She reports she is doing well. Therefore, based on ACC/AHA guidelines, the patient would be at acceptable risk for the planned procedure without further cardiovascular testing. The patient was advised that if she develops new symptoms prior to surgery to contact our office to arrange for a follow-up visit, and she verbalized understanding.  A previous clearance note referenced the anticoagulation clearance for this surgery. Per pharmD notes, "Per office protocol, patient can holdwarfarinfor 5days prior to procedure. Patientwill NOTneed bridging with Lovenox (enoxaparin) around procedure."  I will route this recommendation to the requesting party via [facility] fax function and remove from pre-op pool. Please call with questions.  Charlie Pitter, PA-C 10/27/2019, 11:11 AM

## 2019-10-28 ENCOUNTER — Ambulatory Visit (INDEPENDENT_AMBULATORY_CARE_PROVIDER_SITE_OTHER): Payer: Medicare Other | Admitting: Psychology

## 2019-10-28 DIAGNOSIS — F3181 Bipolar II disorder: Secondary | ICD-10-CM

## 2019-11-05 DIAGNOSIS — Z23 Encounter for immunization: Secondary | ICD-10-CM | POA: Diagnosis not present

## 2019-11-10 ENCOUNTER — Ambulatory Visit (INDEPENDENT_AMBULATORY_CARE_PROVIDER_SITE_OTHER): Payer: Medicare Other | Admitting: Psychology

## 2019-11-10 DIAGNOSIS — F3181 Bipolar II disorder: Secondary | ICD-10-CM | POA: Diagnosis not present

## 2019-11-11 ENCOUNTER — Encounter: Payer: Self-pay | Admitting: Podiatry

## 2019-11-11 ENCOUNTER — Ambulatory Visit: Payer: Medicare Other | Admitting: Psychology

## 2019-11-11 ENCOUNTER — Other Ambulatory Visit: Payer: Self-pay

## 2019-11-11 ENCOUNTER — Other Ambulatory Visit: Payer: Self-pay | Admitting: Podiatry

## 2019-11-11 DIAGNOSIS — M2021 Hallux rigidus, right foot: Secondary | ICD-10-CM

## 2019-11-11 DIAGNOSIS — G43909 Migraine, unspecified, not intractable, without status migrainosus: Secondary | ICD-10-CM | POA: Diagnosis not present

## 2019-11-11 DIAGNOSIS — M19079 Primary osteoarthritis, unspecified ankle and foot: Secondary | ICD-10-CM | POA: Diagnosis not present

## 2019-11-11 DIAGNOSIS — M21611 Bunion of right foot: Secondary | ICD-10-CM | POA: Diagnosis not present

## 2019-11-11 DIAGNOSIS — M2041 Other hammer toe(s) (acquired), right foot: Secondary | ICD-10-CM | POA: Diagnosis not present

## 2019-11-11 DIAGNOSIS — M25571 Pain in right ankle and joints of right foot: Secondary | ICD-10-CM | POA: Diagnosis not present

## 2019-11-11 MED ORDER — HYDROMORPHONE HCL 4 MG PO TABS: 4.0000 mg | ORAL_TABLET | ORAL | 0 refills | Status: DC | PRN

## 2019-11-11 MED ORDER — CLINDAMYCIN HCL 150 MG PO CAPS: 150.0000 mg | ORAL_CAPSULE | Freq: Three times a day (TID) | ORAL | 0 refills | Status: DC

## 2019-11-11 MED ORDER — ONDANSETRON HCL 4 MG PO TABS: 4.0000 mg | ORAL_TABLET | Freq: Three times a day (TID) | ORAL | 0 refills | Status: AC | PRN

## 2019-11-11 MED ORDER — ONDANSETRON HCL 4 MG PO TABS: 4.0000 mg | ORAL_TABLET | Freq: Three times a day (TID) | ORAL | 0 refills | Status: DC | PRN

## 2019-11-11 MED ORDER — CLINDAMYCIN HCL 150 MG PO CAPS: 150.0000 mg | ORAL_CAPSULE | Freq: Two times a day (BID) | ORAL | 0 refills | Status: DC

## 2019-11-11 MED ORDER — CLINDAMYCIN HCL 150 MG PO CAPS: 150.0000 mg | ORAL_CAPSULE | Freq: Two times a day (BID) | ORAL | Status: DC

## 2019-11-11 NOTE — Progress Notes (Signed)
Resent pain medication

## 2019-11-11 NOTE — Progress Notes (Signed)
Rx sent to pharmacy for outpatient surgery. °

## 2019-11-12 ENCOUNTER — Telehealth: Payer: Self-pay

## 2019-11-12 ENCOUNTER — Telehealth: Payer: Self-pay | Admitting: Podiatry

## 2019-11-12 DIAGNOSIS — N183 Chronic kidney disease, stage 3 unspecified: Secondary | ICD-10-CM | POA: Diagnosis not present

## 2019-11-12 DIAGNOSIS — Z5181 Encounter for therapeutic drug level monitoring: Secondary | ICD-10-CM | POA: Diagnosis not present

## 2019-11-12 DIAGNOSIS — Z4789 Encounter for other orthopedic aftercare: Secondary | ICD-10-CM | POA: Diagnosis not present

## 2019-11-12 DIAGNOSIS — I480 Paroxysmal atrial fibrillation: Secondary | ICD-10-CM | POA: Diagnosis not present

## 2019-11-12 DIAGNOSIS — E1122 Type 2 diabetes mellitus with diabetic chronic kidney disease: Secondary | ICD-10-CM | POA: Diagnosis not present

## 2019-11-12 DIAGNOSIS — R2681 Unsteadiness on feet: Secondary | ICD-10-CM | POA: Diagnosis not present

## 2019-11-12 DIAGNOSIS — Z7901 Long term (current) use of anticoagulants: Secondary | ICD-10-CM | POA: Diagnosis not present

## 2019-11-12 DIAGNOSIS — Z8673 Personal history of transient ischemic attack (TIA), and cerebral infarction without residual deficits: Secondary | ICD-10-CM | POA: Diagnosis not present

## 2019-11-12 DIAGNOSIS — M2021 Hallux rigidus, right foot: Secondary | ICD-10-CM | POA: Diagnosis not present

## 2019-11-12 MED ORDER — HYDROMORPHONE HCL 4 MG PO TABS
4.0000 mg | ORAL_TABLET | ORAL | 0 refills | Status: DC | PRN
Start: 2019-11-12 — End: 2020-02-29

## 2019-11-12 NOTE — Telephone Encounter (Signed)
Meredith Nations, RN with [facility] called states pt had foot surgery and has been holding Warfarin.  Pt is resuming Warfarin today at her previous dosage 3.75mg  QD except 5mg  on Mondays.  Secondary to foot surgery pt is unable to get out of home easily for appts.  Requesting verbal order to check PT/INR at home.  Cay Schillings a verbal order to check INR in 1 week on Thursday 11/19/19 and call results to [facility].

## 2019-11-12 NOTE — Telephone Encounter (Signed)
Needs order for occupational therapy as pt doesn't know how to safely bathe and/or dress herself. Request your RN/CMA call with verbal orders.

## 2019-11-12 NOTE — Telephone Encounter (Signed)
Sent medication in to pharmacy. Medication was re-sent by Dr. Jacqualyn Posey yesterday.

## 2019-11-12 NOTE — Telephone Encounter (Signed)
Can you call and approve PT

## 2019-11-12 NOTE — Telephone Encounter (Signed)
Pt said her pain medication has not been called in. [facility] is [facility] on [address]. Please call patient when the medicine has been called in.

## 2019-11-12 NOTE — Addendum Note (Signed)
Addended by: Hardie Pulley on: 11/12/2019 02:32 PM   Modules accepted: Orders

## 2019-11-13 DIAGNOSIS — Z4789 Encounter for other orthopedic aftercare: Secondary | ICD-10-CM | POA: Diagnosis not present

## 2019-11-13 DIAGNOSIS — I480 Paroxysmal atrial fibrillation: Secondary | ICD-10-CM | POA: Diagnosis not present

## 2019-11-13 DIAGNOSIS — R2681 Unsteadiness on feet: Secondary | ICD-10-CM | POA: Diagnosis not present

## 2019-11-13 DIAGNOSIS — E1122 Type 2 diabetes mellitus with diabetic chronic kidney disease: Secondary | ICD-10-CM | POA: Diagnosis not present

## 2019-11-13 DIAGNOSIS — N183 Chronic kidney disease, stage 3 unspecified: Secondary | ICD-10-CM | POA: Diagnosis not present

## 2019-11-13 DIAGNOSIS — Z8673 Personal history of transient ischemic attack (TIA), and cerebral infarction without residual deficits: Secondary | ICD-10-CM | POA: Diagnosis not present

## 2019-11-16 ENCOUNTER — Telehealth: Payer: Self-pay

## 2019-11-16 DIAGNOSIS — I480 Paroxysmal atrial fibrillation: Secondary | ICD-10-CM | POA: Diagnosis not present

## 2019-11-16 DIAGNOSIS — N183 Chronic kidney disease, stage 3 unspecified: Secondary | ICD-10-CM | POA: Diagnosis not present

## 2019-11-16 DIAGNOSIS — Z4789 Encounter for other orthopedic aftercare: Secondary | ICD-10-CM | POA: Diagnosis not present

## 2019-11-16 DIAGNOSIS — E1122 Type 2 diabetes mellitus with diabetic chronic kidney disease: Secondary | ICD-10-CM | POA: Diagnosis not present

## 2019-11-16 DIAGNOSIS — R2681 Unsteadiness on feet: Secondary | ICD-10-CM | POA: Diagnosis not present

## 2019-11-16 DIAGNOSIS — Z8673 Personal history of transient ischemic attack (TIA), and cerebral infarction without residual deficits: Secondary | ICD-10-CM | POA: Diagnosis not present

## 2019-11-16 NOTE — Telephone Encounter (Signed)
Meredith [name] th OT from [facility] [phone]. Called today wanting verbal order for OT times 2 for 2 weeks and OT times 1 for third week. Please advise.

## 2019-11-17 ENCOUNTER — Other Ambulatory Visit: Payer: Self-pay

## 2019-11-17 ENCOUNTER — Ambulatory Visit (INDEPENDENT_AMBULATORY_CARE_PROVIDER_SITE_OTHER): Payer: Medicare Other | Admitting: Podiatry

## 2019-11-17 ENCOUNTER — Ambulatory Visit (INDEPENDENT_AMBULATORY_CARE_PROVIDER_SITE_OTHER): Payer: Medicare Other

## 2019-11-17 DIAGNOSIS — M2021 Hallux rigidus, right foot: Secondary | ICD-10-CM

## 2019-11-17 NOTE — Telephone Encounter (Signed)
Approve orders

## 2019-11-17 NOTE — Telephone Encounter (Signed)
I called and approved orders.

## 2019-11-18 DIAGNOSIS — N183 Chronic kidney disease, stage 3 unspecified: Secondary | ICD-10-CM | POA: Diagnosis not present

## 2019-11-18 DIAGNOSIS — I480 Paroxysmal atrial fibrillation: Secondary | ICD-10-CM | POA: Diagnosis not present

## 2019-11-18 DIAGNOSIS — R2681 Unsteadiness on feet: Secondary | ICD-10-CM | POA: Diagnosis not present

## 2019-11-18 DIAGNOSIS — Z8673 Personal history of transient ischemic attack (TIA), and cerebral infarction without residual deficits: Secondary | ICD-10-CM | POA: Diagnosis not present

## 2019-11-18 DIAGNOSIS — E1122 Type 2 diabetes mellitus with diabetic chronic kidney disease: Secondary | ICD-10-CM | POA: Diagnosis not present

## 2019-11-18 DIAGNOSIS — Z4789 Encounter for other orthopedic aftercare: Secondary | ICD-10-CM | POA: Diagnosis not present

## 2019-11-18 NOTE — Telephone Encounter (Signed)
Called and spoke with [facility] on Monday [phone] [name]) and they are already taken care of this patient and [name] stated that the patient has already fallen even on the day of the surgery and I stated that we would check on the patient as well. Meredith [name]

## 2019-11-19 ENCOUNTER — Ambulatory Visit (INDEPENDENT_AMBULATORY_CARE_PROVIDER_SITE_OTHER): Payer: Medicare Other

## 2019-11-19 DIAGNOSIS — E1122 Type 2 diabetes mellitus with diabetic chronic kidney disease: Secondary | ICD-10-CM | POA: Diagnosis not present

## 2019-11-19 DIAGNOSIS — Z5181 Encounter for therapeutic drug level monitoring: Secondary | ICD-10-CM | POA: Diagnosis not present

## 2019-11-19 DIAGNOSIS — N183 Chronic kidney disease, stage 3 unspecified: Secondary | ICD-10-CM | POA: Diagnosis not present

## 2019-11-19 DIAGNOSIS — R2681 Unsteadiness on feet: Secondary | ICD-10-CM | POA: Diagnosis not present

## 2019-11-19 DIAGNOSIS — Z4789 Encounter for other orthopedic aftercare: Secondary | ICD-10-CM | POA: Diagnosis not present

## 2019-11-19 DIAGNOSIS — I480 Paroxysmal atrial fibrillation: Secondary | ICD-10-CM | POA: Diagnosis not present

## 2019-11-19 DIAGNOSIS — Z8673 Personal history of transient ischemic attack (TIA), and cerebral infarction without residual deficits: Secondary | ICD-10-CM | POA: Diagnosis not present

## 2019-11-19 LAB — POCT INR: INR: 1.8 — AB (ref 2.0–3.0)

## 2019-11-19 NOTE — Patient Instructions (Signed)
Description   Spoke with Maudie Mercury, RN with [facility] while in pt's home, advised to have pt take 2 tablets today, then resume same dosage 1.5 tablets daily except for 2 tablets on Mondays. Please continue leafy veggies in your diet. Recheck INR in 1 week s/p foot surgery on 11/11/19 (usually 6 week recheck). Call with any medication changes. [facility] [phone] Main [phone]

## 2019-11-20 DIAGNOSIS — Z4789 Encounter for other orthopedic aftercare: Secondary | ICD-10-CM | POA: Diagnosis not present

## 2019-11-20 DIAGNOSIS — Z8673 Personal history of transient ischemic attack (TIA), and cerebral infarction without residual deficits: Secondary | ICD-10-CM | POA: Diagnosis not present

## 2019-11-20 DIAGNOSIS — R2681 Unsteadiness on feet: Secondary | ICD-10-CM | POA: Diagnosis not present

## 2019-11-20 DIAGNOSIS — N183 Chronic kidney disease, stage 3 unspecified: Secondary | ICD-10-CM | POA: Diagnosis not present

## 2019-11-20 DIAGNOSIS — E1122 Type 2 diabetes mellitus with diabetic chronic kidney disease: Secondary | ICD-10-CM | POA: Diagnosis not present

## 2019-11-20 DIAGNOSIS — I480 Paroxysmal atrial fibrillation: Secondary | ICD-10-CM | POA: Diagnosis not present

## 2019-11-23 DIAGNOSIS — Z8673 Personal history of transient ischemic attack (TIA), and cerebral infarction without residual deficits: Secondary | ICD-10-CM | POA: Diagnosis not present

## 2019-11-23 DIAGNOSIS — N183 Chronic kidney disease, stage 3 unspecified: Secondary | ICD-10-CM | POA: Diagnosis not present

## 2019-11-23 DIAGNOSIS — E1122 Type 2 diabetes mellitus with diabetic chronic kidney disease: Secondary | ICD-10-CM | POA: Diagnosis not present

## 2019-11-23 DIAGNOSIS — I480 Paroxysmal atrial fibrillation: Secondary | ICD-10-CM | POA: Diagnosis not present

## 2019-11-23 DIAGNOSIS — Z4789 Encounter for other orthopedic aftercare: Secondary | ICD-10-CM | POA: Diagnosis not present

## 2019-11-23 DIAGNOSIS — R2681 Unsteadiness on feet: Secondary | ICD-10-CM | POA: Diagnosis not present

## 2019-11-24 DIAGNOSIS — N183 Chronic kidney disease, stage 3 unspecified: Secondary | ICD-10-CM | POA: Diagnosis not present

## 2019-11-24 DIAGNOSIS — Z8673 Personal history of transient ischemic attack (TIA), and cerebral infarction without residual deficits: Secondary | ICD-10-CM | POA: Diagnosis not present

## 2019-11-24 DIAGNOSIS — I480 Paroxysmal atrial fibrillation: Secondary | ICD-10-CM | POA: Diagnosis not present

## 2019-11-24 DIAGNOSIS — R2681 Unsteadiness on feet: Secondary | ICD-10-CM | POA: Diagnosis not present

## 2019-11-24 DIAGNOSIS — E1122 Type 2 diabetes mellitus with diabetic chronic kidney disease: Secondary | ICD-10-CM | POA: Diagnosis not present

## 2019-11-24 DIAGNOSIS — Z4789 Encounter for other orthopedic aftercare: Secondary | ICD-10-CM | POA: Diagnosis not present

## 2019-11-25 ENCOUNTER — Ambulatory Visit (INDEPENDENT_AMBULATORY_CARE_PROVIDER_SITE_OTHER): Payer: Medicare Other | Admitting: Psychology

## 2019-11-25 ENCOUNTER — Ambulatory Visit (INDEPENDENT_AMBULATORY_CARE_PROVIDER_SITE_OTHER): Payer: Medicare Other | Admitting: Pharmacist

## 2019-11-25 DIAGNOSIS — I480 Paroxysmal atrial fibrillation: Secondary | ICD-10-CM | POA: Diagnosis not present

## 2019-11-25 DIAGNOSIS — R2681 Unsteadiness on feet: Secondary | ICD-10-CM | POA: Diagnosis not present

## 2019-11-25 DIAGNOSIS — E1122 Type 2 diabetes mellitus with diabetic chronic kidney disease: Secondary | ICD-10-CM | POA: Diagnosis not present

## 2019-11-25 DIAGNOSIS — N183 Chronic kidney disease, stage 3 unspecified: Secondary | ICD-10-CM | POA: Diagnosis not present

## 2019-11-25 DIAGNOSIS — Z5181 Encounter for therapeutic drug level monitoring: Secondary | ICD-10-CM

## 2019-11-25 DIAGNOSIS — F3181 Bipolar II disorder: Secondary | ICD-10-CM

## 2019-11-25 DIAGNOSIS — Z8673 Personal history of transient ischemic attack (TIA), and cerebral infarction without residual deficits: Secondary | ICD-10-CM | POA: Diagnosis not present

## 2019-11-25 DIAGNOSIS — Z4789 Encounter for other orthopedic aftercare: Secondary | ICD-10-CM | POA: Diagnosis not present

## 2019-11-25 LAB — POCT INR: INR: 1.9 — AB (ref 2.0–3.0)

## 2019-11-25 NOTE — Patient Instructions (Signed)
Description   Spoke with [facility] while in pt's home, advised to have pt take 2 tablets today, then resume same dosage 1.5 tablets daily except for 2 tablets on Mondays. Please continue leafy veggies in your diet. Recheck INR in 1 week. Call with any medication changes. [facility] [phone] Main [phone]

## 2019-11-26 DIAGNOSIS — E1122 Type 2 diabetes mellitus with diabetic chronic kidney disease: Secondary | ICD-10-CM | POA: Diagnosis not present

## 2019-11-26 DIAGNOSIS — I480 Paroxysmal atrial fibrillation: Secondary | ICD-10-CM | POA: Diagnosis not present

## 2019-11-26 DIAGNOSIS — R2681 Unsteadiness on feet: Secondary | ICD-10-CM | POA: Diagnosis not present

## 2019-11-26 DIAGNOSIS — Z4789 Encounter for other orthopedic aftercare: Secondary | ICD-10-CM | POA: Diagnosis not present

## 2019-11-26 DIAGNOSIS — Z8673 Personal history of transient ischemic attack (TIA), and cerebral infarction without residual deficits: Secondary | ICD-10-CM | POA: Diagnosis not present

## 2019-11-26 DIAGNOSIS — N183 Chronic kidney disease, stage 3 unspecified: Secondary | ICD-10-CM | POA: Diagnosis not present

## 2019-11-26 NOTE — Progress Notes (Signed)
  Subjective:  Patient ID: Meredith [name], female    DOB: [birth year],  MRN: [id number]  Chief Complaint  Patient presents with  . Routine Post Op    POV#1 dos 9.15.2021 keller bunion implant, hammer toe repair or hallux ipj fusion rt. Pt denies fever/nausea/vomiting/chills and states no concerns.    DOS: 11/11/19 Procedure: [name] bunionectomy with implant left, hallux IPJ arthroplasty   69 y.o. female presents with the above complaint. History confirmed with patient.  Doing well denies postop issues or concerns.  Pain controlled  Objective:  Physical Exam: tenderness at the surgical site, local edema noted and calf supple, nontender. Incision: healing well, no significant drainage, no dehiscence, no significant erythema  No images are attached to the encounter.  Radiographs: X-ray of the left foot: Consistent with postop state implant arthroplasty noted of the first metatarsophalangeal joint with arthroplasty across the hallux interphalangeal joint  Assessment:   1. Hallux rigidus of right foot     Plan:  Patient was evaluated and treated and all questions answered.  Post-operative State -XR reviewed with patient -Dressing applied consisting of sterile gauze, kerlix and ACE bandage -WBAT in CAM boot  No follow-ups on file.

## 2019-11-27 ENCOUNTER — Ambulatory Visit (INDEPENDENT_AMBULATORY_CARE_PROVIDER_SITE_OTHER): Payer: Medicare Other | Admitting: Podiatry

## 2019-11-27 ENCOUNTER — Other Ambulatory Visit: Payer: Self-pay

## 2019-11-27 ENCOUNTER — Other Ambulatory Visit: Payer: Self-pay | Admitting: Cardiovascular Disease

## 2019-11-27 DIAGNOSIS — M2021 Hallux rigidus, right foot: Secondary | ICD-10-CM

## 2019-11-27 DIAGNOSIS — Z9889 Other specified postprocedural states: Secondary | ICD-10-CM

## 2019-11-27 DIAGNOSIS — Z23 Encounter for immunization: Secondary | ICD-10-CM | POA: Diagnosis not present

## 2019-11-27 DIAGNOSIS — M2031 Hallux varus (acquired), right foot: Secondary | ICD-10-CM

## 2019-11-27 NOTE — Progress Notes (Signed)
  Subjective:  Patient ID: Meredith [name], female    DOB: 1950/09/27,  MRN: [id number]  Chief Complaint  Patient presents with  . Routine Post Op    POV#2 DOS 11/11/2019 Pt states no concerns and denies fever/nausea/vomiting/chills.    DOS: 11/11/19 Procedure: [name] bunionectomy with implant left, hallux IPJ arthroplasty   69 y.o. female presents with the above complaint. History confirmed with patient.  Doing well denies postop issues or concerns.  Pain controlled  Objective:  Physical Exam: tenderness at the surgical site, local edema noted and calf supple, nontender. Incision: healing well, no significant drainage, no dehiscence, no significant erythema  No images are attached to the encounter.  Assessment:   1. Hallux rigidus of right foot   2. Hallux malleus of right foot   3. Post-operative state     Plan:  Patient was evaluated and treated and all questions answered.  Post-operative State -Sutures removed except two most distal at the toe. -Encouraged work on [place] applied to the incision -WBAT in Surgical shoe -F/u in 1 week for remaining suture removal  No follow-ups on file.

## 2019-11-30 DIAGNOSIS — N183 Chronic kidney disease, stage 3 unspecified: Secondary | ICD-10-CM | POA: Diagnosis not present

## 2019-11-30 DIAGNOSIS — Z4789 Encounter for other orthopedic aftercare: Secondary | ICD-10-CM | POA: Diagnosis not present

## 2019-11-30 DIAGNOSIS — R2681 Unsteadiness on feet: Secondary | ICD-10-CM | POA: Diagnosis not present

## 2019-11-30 DIAGNOSIS — I480 Paroxysmal atrial fibrillation: Secondary | ICD-10-CM | POA: Diagnosis not present

## 2019-11-30 DIAGNOSIS — E1122 Type 2 diabetes mellitus with diabetic chronic kidney disease: Secondary | ICD-10-CM | POA: Diagnosis not present

## 2019-11-30 DIAGNOSIS — Z8673 Personal history of transient ischemic attack (TIA), and cerebral infarction without residual deficits: Secondary | ICD-10-CM | POA: Diagnosis not present

## 2019-12-02 ENCOUNTER — Telehealth: Payer: Self-pay | Admitting: *Deleted

## 2019-12-02 NOTE — Telephone Encounter (Signed)
Left a message for Maudie Mercury with [facility] to call back in reference to obtaining the pt's INR; pt was due today, left direct number so will await a call back and try again tomorrow.

## 2019-12-03 ENCOUNTER — Encounter: Payer: Self-pay | Admitting: Psychiatry

## 2019-12-03 ENCOUNTER — Ambulatory Visit (INDEPENDENT_AMBULATORY_CARE_PROVIDER_SITE_OTHER): Payer: Medicare Other | Admitting: Psychiatry

## 2019-12-03 ENCOUNTER — Telehealth: Payer: Self-pay | Admitting: *Deleted

## 2019-12-03 ENCOUNTER — Other Ambulatory Visit: Payer: Self-pay

## 2019-12-03 DIAGNOSIS — F4001 Agoraphobia with panic disorder: Secondary | ICD-10-CM | POA: Diagnosis not present

## 2019-12-03 DIAGNOSIS — F3131 Bipolar disorder, current episode depressed, mild: Secondary | ICD-10-CM | POA: Diagnosis not present

## 2019-12-03 DIAGNOSIS — G3184 Mild cognitive impairment, so stated: Secondary | ICD-10-CM | POA: Diagnosis not present

## 2019-12-03 DIAGNOSIS — I639 Cerebral infarction, unspecified: Secondary | ICD-10-CM | POA: Diagnosis not present

## 2019-12-03 DIAGNOSIS — F411 Generalized anxiety disorder: Secondary | ICD-10-CM

## 2019-12-03 DIAGNOSIS — Z79899 Other long term (current) drug therapy: Secondary | ICD-10-CM

## 2019-12-03 DIAGNOSIS — F3132 Bipolar disorder, current episode depressed, moderate: Secondary | ICD-10-CM

## 2019-12-03 DIAGNOSIS — F4310 Post-traumatic stress disorder, unspecified: Secondary | ICD-10-CM

## 2019-12-03 DIAGNOSIS — F5105 Insomnia due to other mental disorder: Secondary | ICD-10-CM | POA: Diagnosis not present

## 2019-12-03 MED ORDER — ARIPIPRAZOLE 30 MG PO TABS: 30.0000 mg | ORAL_TABLET | Freq: Every day | ORAL | 1 refills | Status: DC

## 2019-12-03 MED ORDER — QUETIAPINE FUMARATE 200 MG PO TABS: 200.0000 mg | ORAL_TABLET | Freq: Every day | ORAL | 1 refills | Status: DC

## 2019-12-03 MED ORDER — LAMOTRIGINE 100 MG PO TABS: 100.0000 mg | ORAL_TABLET | Freq: Two times a day (BID) | ORAL | 5 refills | Status: DC

## 2019-12-03 NOTE — Progress Notes (Signed)
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  02/17/2020  1:00 PM Oren Binet, PhD [facility] None  02/29/2020  2:20 PM [name], Wonda Cheng, MD [facility]  03/02/2020  1:00 PM Oren Binet, PhD [facility] None  03/16/2020  1:00 PM Oren Binet, PhD [facility] None  03/30/2020  1:00 PM Oren Binet, PhD [facility] None  04/13/2020  1:00 PM Oren Binet, PhD [facility] None  04/27/2020  1:00 PM Oren Binet, PhD [facility] None  05/11/2020  1:00 PM Oren Binet, PhD [facility] None    Orders Placed This Encounter  Procedures  . Basic metabolic panel  . Lithium level      -------------------------------

## 2019-12-03 NOTE — Patient Instructions (Signed)
[facility] for labs any morning [phone] [address] [place], [place] [address]

## 2019-12-03 NOTE — Telephone Encounter (Signed)
Pt in the coumadin clinic follow up book for home health to check INR on 10/6. Kathlee Nations from [facility] called and stated that they were not able to check Pt's INR yesterday and that they would be going out on 10/8 to check pt's INR.

## 2019-12-04 ENCOUNTER — Ambulatory Visit (INDEPENDENT_AMBULATORY_CARE_PROVIDER_SITE_OTHER): Payer: Medicare Other | Admitting: Podiatry

## 2019-12-04 ENCOUNTER — Ambulatory Visit (INDEPENDENT_AMBULATORY_CARE_PROVIDER_SITE_OTHER): Payer: Medicare Other

## 2019-12-04 DIAGNOSIS — I480 Paroxysmal atrial fibrillation: Secondary | ICD-10-CM | POA: Diagnosis not present

## 2019-12-04 DIAGNOSIS — E1122 Type 2 diabetes mellitus with diabetic chronic kidney disease: Secondary | ICD-10-CM | POA: Diagnosis not present

## 2019-12-04 DIAGNOSIS — M2031 Hallux varus (acquired), right foot: Secondary | ICD-10-CM

## 2019-12-04 DIAGNOSIS — R2681 Unsteadiness on feet: Secondary | ICD-10-CM | POA: Diagnosis not present

## 2019-12-04 DIAGNOSIS — N183 Chronic kidney disease, stage 3 unspecified: Secondary | ICD-10-CM | POA: Diagnosis not present

## 2019-12-04 DIAGNOSIS — Z5181 Encounter for therapeutic drug level monitoring: Secondary | ICD-10-CM | POA: Diagnosis not present

## 2019-12-04 DIAGNOSIS — Z8673 Personal history of transient ischemic attack (TIA), and cerebral infarction without residual deficits: Secondary | ICD-10-CM | POA: Diagnosis not present

## 2019-12-04 DIAGNOSIS — Z4789 Encounter for other orthopedic aftercare: Secondary | ICD-10-CM | POA: Diagnosis not present

## 2019-12-04 LAB — POCT INR: INR: 3.4 — AB (ref 2.0–3.0)

## 2019-12-04 NOTE — Progress Notes (Signed)
  Subjective:  Patient ID: Meredith [name], female    DOB: [birth year],  MRN: [id number]  Chief Complaint  Patient presents with  . Routine Post Op    DOS: 11/11/19 Procedure: [name] bunionectomy with implant left, hallux IPJ arthroplasty   69 y.o. female presents with the above complaint. History confirmed with patient.  Doing well denies postop issues or concerns.  Pain controlled  Objective:  Physical Exam: tenderness at the surgical site, local edema noted and calf supple, nontender. Incision: healing well, delayed healing distally without full dehiscence. No images are attached to the encounter.  Assessment:   1. Hallux malleus of right foot     Plan:  Patient was evaluated and treated and all questions answered.  Post-operative State -Remaining sutures removed. -XR reviewed consistent with post-op state stable implant position. -Pt to apply ointment and band-aid to incision daily. -Transition to normal shoe in 2 weeks, then f/u in 4 weeks  Return in about 4 weeks (around 01/01/2020) for Post-Op (No XRs).

## 2019-12-04 NOTE — Patient Instructions (Signed)
Description   Spoke with Kathlee Nations [facility] RN while in pt's home, advised to have pt skip tomorrow's dosage of Warfarin, then resume same dosage 1.5 tablets daily except for 2 tablets on Mondays. Please continue leafy veggies in your diet. Recheck INR in 2 weeks. [place] discharging pt today, follow-up made in office, pt aware. Call with any medication changes. Coumadin Clinic [phone] Main [phone]

## 2019-12-07 DIAGNOSIS — F3132 Bipolar disorder, current episode depressed, moderate: Secondary | ICD-10-CM | POA: Diagnosis not present

## 2019-12-07 DIAGNOSIS — M791 Myalgia, unspecified site: Secondary | ICD-10-CM | POA: Diagnosis not present

## 2019-12-07 DIAGNOSIS — Z79899 Other long term (current) drug therapy: Secondary | ICD-10-CM | POA: Diagnosis not present

## 2019-12-07 DIAGNOSIS — G43719 Chronic migraine without aura, intractable, without status migrainosus: Secondary | ICD-10-CM | POA: Diagnosis not present

## 2019-12-07 DIAGNOSIS — M542 Cervicalgia: Secondary | ICD-10-CM | POA: Diagnosis not present

## 2019-12-07 DIAGNOSIS — G518 Other disorders of facial nerve: Secondary | ICD-10-CM | POA: Diagnosis not present

## 2019-12-08 DIAGNOSIS — Z79899 Other long term (current) drug therapy: Secondary | ICD-10-CM | POA: Diagnosis not present

## 2019-12-08 DIAGNOSIS — M47814 Spondylosis without myelopathy or radiculopathy, thoracic region: Secondary | ICD-10-CM | POA: Diagnosis not present

## 2019-12-08 DIAGNOSIS — M797 Fibromyalgia: Secondary | ICD-10-CM | POA: Diagnosis not present

## 2019-12-08 DIAGNOSIS — M47816 Spondylosis without myelopathy or radiculopathy, lumbar region: Secondary | ICD-10-CM | POA: Diagnosis not present

## 2019-12-08 DIAGNOSIS — G894 Chronic pain syndrome: Secondary | ICD-10-CM | POA: Diagnosis not present

## 2019-12-08 DIAGNOSIS — Z79891 Long term (current) use of opiate analgesic: Secondary | ICD-10-CM | POA: Diagnosis not present

## 2019-12-08 LAB — BASIC METABOLIC PANEL
BUN/Creatinine Ratio: 14 (ref 12–28)
BUN: 15 mg/dL (ref 8–27)
CO2: 27 mmol/L (ref 20–29)
Calcium: 9.8 mg/dL (ref 8.7–10.3)
Chloride: 103 mmol/L (ref 96–106)
Creatinine, Ser: 1.1 mg/dL — ABNORMAL HIGH (ref 0.57–1.00)
GFR calc Af Amer: 59 mL/min/{1.73_m2} — ABNORMAL LOW (ref >59)
GFR calc non Af Amer: 51 mL/min/{1.73_m2} — ABNORMAL LOW (ref >59)
Glucose: 79 mg/dL (ref 65–99)
Potassium: 4.9 mmol/L (ref 3.5–5.2)
Sodium: 141 mmol/L (ref 134–144)

## 2019-12-08 LAB — LITHIUM LEVEL: Lithium Lvl: 1 mmol/L (ref 0.5–1.2)

## 2019-12-09 ENCOUNTER — Ambulatory Visit (INDEPENDENT_AMBULATORY_CARE_PROVIDER_SITE_OTHER): Payer: Medicare Other | Admitting: Psychology

## 2019-12-09 ENCOUNTER — Other Ambulatory Visit: Payer: Self-pay | Admitting: Psychiatry

## 2019-12-09 DIAGNOSIS — F3181 Bipolar II disorder: Secondary | ICD-10-CM

## 2019-12-09 DIAGNOSIS — I480 Paroxysmal atrial fibrillation: Secondary | ICD-10-CM | POA: Diagnosis not present

## 2019-12-09 DIAGNOSIS — R2681 Unsteadiness on feet: Secondary | ICD-10-CM | POA: Diagnosis not present

## 2019-12-09 DIAGNOSIS — F3132 Bipolar disorder, current episode depressed, moderate: Secondary | ICD-10-CM

## 2019-12-09 DIAGNOSIS — N183 Chronic kidney disease, stage 3 unspecified: Secondary | ICD-10-CM | POA: Diagnosis not present

## 2019-12-09 DIAGNOSIS — E1122 Type 2 diabetes mellitus with diabetic chronic kidney disease: Secondary | ICD-10-CM | POA: Diagnosis not present

## 2019-12-09 DIAGNOSIS — Z4789 Encounter for other orthopedic aftercare: Secondary | ICD-10-CM | POA: Diagnosis not present

## 2019-12-09 DIAGNOSIS — Z8673 Personal history of transient ischemic attack (TIA), and cerebral infarction without residual deficits: Secondary | ICD-10-CM | POA: Diagnosis not present

## 2019-12-10 NOTE — Progress Notes (Signed)
Lithium level 1.0 on 450 mg every afternoon.  Creatinine 1.1 and calcium 9.8.  She has complained of some jerks recently so we could consider reducing the lithium slightly but as she is at a low dose this is not very easy to do practically.  Particularly since she is got some memory problems.  Her brother does help her with preparing a med box so it is possible we can get his assistance to have her alternate 450 mg with 300 mg every other day but I would like to defer this as long as possible.

## 2019-12-11 ENCOUNTER — Encounter: Payer: Medicare Other | Admitting: Podiatry

## 2019-12-12 DIAGNOSIS — Z7901 Long term (current) use of anticoagulants: Secondary | ICD-10-CM | POA: Diagnosis not present

## 2019-12-12 DIAGNOSIS — I480 Paroxysmal atrial fibrillation: Secondary | ICD-10-CM | POA: Diagnosis not present

## 2019-12-12 DIAGNOSIS — E1122 Type 2 diabetes mellitus with diabetic chronic kidney disease: Secondary | ICD-10-CM | POA: Diagnosis not present

## 2019-12-12 DIAGNOSIS — R2681 Unsteadiness on feet: Secondary | ICD-10-CM | POA: Diagnosis not present

## 2019-12-12 DIAGNOSIS — Z8673 Personal history of transient ischemic attack (TIA), and cerebral infarction without residual deficits: Secondary | ICD-10-CM | POA: Diagnosis not present

## 2019-12-12 DIAGNOSIS — Z4789 Encounter for other orthopedic aftercare: Secondary | ICD-10-CM | POA: Diagnosis not present

## 2019-12-12 DIAGNOSIS — Z5181 Encounter for therapeutic drug level monitoring: Secondary | ICD-10-CM | POA: Diagnosis not present

## 2019-12-12 DIAGNOSIS — N183 Chronic kidney disease, stage 3 unspecified: Secondary | ICD-10-CM | POA: Diagnosis not present

## 2019-12-15 ENCOUNTER — Telehealth: Payer: Self-pay | Admitting: Podiatry

## 2019-12-15 ENCOUNTER — Other Ambulatory Visit: Payer: Self-pay | Admitting: Psychiatry

## 2019-12-15 DIAGNOSIS — Z8673 Personal history of transient ischemic attack (TIA), and cerebral infarction without residual deficits: Secondary | ICD-10-CM | POA: Diagnosis not present

## 2019-12-15 DIAGNOSIS — E1122 Type 2 diabetes mellitus with diabetic chronic kidney disease: Secondary | ICD-10-CM | POA: Diagnosis not present

## 2019-12-15 DIAGNOSIS — N183 Chronic kidney disease, stage 3 unspecified: Secondary | ICD-10-CM | POA: Diagnosis not present

## 2019-12-15 DIAGNOSIS — F3132 Bipolar disorder, current episode depressed, moderate: Secondary | ICD-10-CM

## 2019-12-15 DIAGNOSIS — Z4789 Encounter for other orthopedic aftercare: Secondary | ICD-10-CM | POA: Diagnosis not present

## 2019-12-15 DIAGNOSIS — R2681 Unsteadiness on feet: Secondary | ICD-10-CM | POA: Diagnosis not present

## 2019-12-15 DIAGNOSIS — I480 Paroxysmal atrial fibrillation: Secondary | ICD-10-CM | POA: Diagnosis not present

## 2019-12-15 NOTE — Telephone Encounter (Signed)
The physical therapist said he is going to extend her physical therapy until she is seen for her next appointment. The new frequency will be 1x a week for the next 4 weeks.

## 2019-12-15 NOTE — Telephone Encounter (Signed)
Pt called requesting refill on Seroquel generic 200 mg. Refill was sent to [place] location on 10/7. Pt advised  The only med she fills from [facility] is Abilify. Please note this for future request. Will go to [facility] this time for [place].

## 2019-12-16 ENCOUNTER — Other Ambulatory Visit: Payer: Self-pay

## 2019-12-16 ENCOUNTER — Ambulatory Visit (INDEPENDENT_AMBULATORY_CARE_PROVIDER_SITE_OTHER): Payer: Medicare Other | Admitting: *Deleted

## 2019-12-16 DIAGNOSIS — I480 Paroxysmal atrial fibrillation: Secondary | ICD-10-CM

## 2019-12-16 DIAGNOSIS — Z5181 Encounter for therapeutic drug level monitoring: Secondary | ICD-10-CM | POA: Diagnosis not present

## 2019-12-16 DIAGNOSIS — F3132 Bipolar disorder, current episode depressed, moderate: Secondary | ICD-10-CM

## 2019-12-16 LAB — POCT INR: INR: 1.9 — AB (ref 2.0–3.0)

## 2019-12-16 MED ORDER — QUETIAPINE FUMARATE 200 MG PO TABS: 200.0000 mg | ORAL_TABLET | Freq: Every day | ORAL | 1 refills | Status: DC

## 2019-12-16 NOTE — Patient Instructions (Signed)
Description   Today take another 1/2 tablet then continue taking Warfarin 1.5 tablets daily except for 2 tablets on Mondays. Recheck INR in 2 weeks. Call with any medication changes. Coumadin Clinic [phone] Main [phone]

## 2019-12-21 NOTE — Telephone Encounter (Signed)
Thanks for the update

## 2019-12-22 DIAGNOSIS — Z4789 Encounter for other orthopedic aftercare: Secondary | ICD-10-CM | POA: Diagnosis not present

## 2019-12-22 DIAGNOSIS — E559 Vitamin D deficiency, unspecified: Secondary | ICD-10-CM | POA: Diagnosis not present

## 2019-12-22 DIAGNOSIS — F5101 Primary insomnia: Secondary | ICD-10-CM | POA: Diagnosis not present

## 2019-12-22 DIAGNOSIS — Z8673 Personal history of transient ischemic attack (TIA), and cerebral infarction without residual deficits: Secondary | ICD-10-CM | POA: Diagnosis not present

## 2019-12-22 DIAGNOSIS — D6869 Other thrombophilia: Secondary | ICD-10-CM | POA: Diagnosis not present

## 2019-12-22 DIAGNOSIS — Z7901 Long term (current) use of anticoagulants: Secondary | ICD-10-CM | POA: Diagnosis not present

## 2019-12-22 DIAGNOSIS — N183 Chronic kidney disease, stage 3 unspecified: Secondary | ICD-10-CM | POA: Diagnosis not present

## 2019-12-22 DIAGNOSIS — Z5181 Encounter for therapeutic drug level monitoring: Secondary | ICD-10-CM | POA: Diagnosis not present

## 2019-12-22 DIAGNOSIS — E1122 Type 2 diabetes mellitus with diabetic chronic kidney disease: Secondary | ICD-10-CM | POA: Diagnosis not present

## 2019-12-22 DIAGNOSIS — E782 Mixed hyperlipidemia: Secondary | ICD-10-CM | POA: Diagnosis not present

## 2019-12-22 DIAGNOSIS — E039 Hypothyroidism, unspecified: Secondary | ICD-10-CM | POA: Diagnosis not present

## 2019-12-22 DIAGNOSIS — Z1211 Encounter for screening for malignant neoplasm of colon: Secondary | ICD-10-CM | POA: Diagnosis not present

## 2019-12-22 DIAGNOSIS — I480 Paroxysmal atrial fibrillation: Secondary | ICD-10-CM | POA: Diagnosis not present

## 2019-12-22 DIAGNOSIS — Z0000 Encounter for general adult medical examination without abnormal findings: Secondary | ICD-10-CM | POA: Diagnosis not present

## 2019-12-22 DIAGNOSIS — R2681 Unsteadiness on feet: Secondary | ICD-10-CM | POA: Diagnosis not present

## 2019-12-22 DIAGNOSIS — G43009 Migraine without aura, not intractable, without status migrainosus: Secondary | ICD-10-CM | POA: Diagnosis not present

## 2019-12-23 ENCOUNTER — Ambulatory Visit (INDEPENDENT_AMBULATORY_CARE_PROVIDER_SITE_OTHER): Payer: Medicare Other | Admitting: Psychology

## 2019-12-23 DIAGNOSIS — F3181 Bipolar II disorder: Secondary | ICD-10-CM

## 2019-12-28 DIAGNOSIS — I480 Paroxysmal atrial fibrillation: Secondary | ICD-10-CM | POA: Diagnosis not present

## 2019-12-28 DIAGNOSIS — E1122 Type 2 diabetes mellitus with diabetic chronic kidney disease: Secondary | ICD-10-CM | POA: Diagnosis not present

## 2019-12-28 DIAGNOSIS — Z8673 Personal history of transient ischemic attack (TIA), and cerebral infarction without residual deficits: Secondary | ICD-10-CM | POA: Diagnosis not present

## 2019-12-28 DIAGNOSIS — Z4789 Encounter for other orthopedic aftercare: Secondary | ICD-10-CM | POA: Diagnosis not present

## 2019-12-28 DIAGNOSIS — R2681 Unsteadiness on feet: Secondary | ICD-10-CM | POA: Diagnosis not present

## 2019-12-28 DIAGNOSIS — N183 Chronic kidney disease, stage 3 unspecified: Secondary | ICD-10-CM | POA: Diagnosis not present

## 2019-12-29 ENCOUNTER — Encounter: Payer: Medicare Other | Admitting: Podiatry

## 2020-01-01 ENCOUNTER — Encounter: Payer: Self-pay | Admitting: Counselor

## 2020-01-01 ENCOUNTER — Encounter: Payer: Medicare Other | Admitting: Podiatry

## 2020-01-01 ENCOUNTER — Other Ambulatory Visit: Payer: Self-pay

## 2020-01-01 ENCOUNTER — Ambulatory Visit (INDEPENDENT_AMBULATORY_CARE_PROVIDER_SITE_OTHER): Payer: Medicare Other | Admitting: *Deleted

## 2020-01-01 DIAGNOSIS — Z5181 Encounter for therapeutic drug level monitoring: Secondary | ICD-10-CM | POA: Diagnosis not present

## 2020-01-01 DIAGNOSIS — I480 Paroxysmal atrial fibrillation: Secondary | ICD-10-CM | POA: Diagnosis not present

## 2020-01-01 LAB — POCT INR: INR: 2.1 (ref 2.0–3.0)

## 2020-01-01 NOTE — Patient Instructions (Addendum)
Description   Continue taking Warfarin 1.5 tablets daily except for 2 tablets on Mondays. Recheck INR in 3 weeks. Call with any medication changes. Coumadin Clinic [phone] Main [phone]

## 2020-01-04 DIAGNOSIS — N183 Chronic kidney disease, stage 3 unspecified: Secondary | ICD-10-CM | POA: Diagnosis not present

## 2020-01-04 DIAGNOSIS — I480 Paroxysmal atrial fibrillation: Secondary | ICD-10-CM | POA: Diagnosis not present

## 2020-01-04 DIAGNOSIS — Z4789 Encounter for other orthopedic aftercare: Secondary | ICD-10-CM | POA: Diagnosis not present

## 2020-01-04 DIAGNOSIS — Z8673 Personal history of transient ischemic attack (TIA), and cerebral infarction without residual deficits: Secondary | ICD-10-CM | POA: Diagnosis not present

## 2020-01-04 DIAGNOSIS — R2681 Unsteadiness on feet: Secondary | ICD-10-CM | POA: Diagnosis not present

## 2020-01-04 DIAGNOSIS — E1122 Type 2 diabetes mellitus with diabetic chronic kidney disease: Secondary | ICD-10-CM | POA: Diagnosis not present

## 2020-01-06 ENCOUNTER — Ambulatory Visit (INDEPENDENT_AMBULATORY_CARE_PROVIDER_SITE_OTHER): Payer: Medicare Other | Admitting: Psychology

## 2020-01-06 DIAGNOSIS — F3181 Bipolar II disorder: Secondary | ICD-10-CM

## 2020-01-09 ENCOUNTER — Encounter: Payer: Self-pay | Admitting: Podiatry

## 2020-01-09 ENCOUNTER — Other Ambulatory Visit: Payer: Self-pay

## 2020-01-09 ENCOUNTER — Ambulatory Visit (INDEPENDENT_AMBULATORY_CARE_PROVIDER_SITE_OTHER): Payer: Medicare Other | Admitting: Podiatry

## 2020-01-09 DIAGNOSIS — M2031 Hallux varus (acquired), right foot: Secondary | ICD-10-CM

## 2020-01-09 DIAGNOSIS — Z9889 Other specified postprocedural states: Secondary | ICD-10-CM

## 2020-01-09 DIAGNOSIS — M2021 Hallux rigidus, right foot: Secondary | ICD-10-CM

## 2020-01-12 ENCOUNTER — Other Ambulatory Visit: Payer: Self-pay | Admitting: Psychiatry

## 2020-01-12 DIAGNOSIS — M791 Myalgia, unspecified site: Secondary | ICD-10-CM | POA: Diagnosis not present

## 2020-01-12 DIAGNOSIS — G518 Other disorders of facial nerve: Secondary | ICD-10-CM | POA: Diagnosis not present

## 2020-01-12 DIAGNOSIS — F3181 Bipolar II disorder: Secondary | ICD-10-CM

## 2020-01-12 DIAGNOSIS — G43719 Chronic migraine without aura, intractable, without status migrainosus: Secondary | ICD-10-CM | POA: Diagnosis not present

## 2020-01-12 DIAGNOSIS — M542 Cervicalgia: Secondary | ICD-10-CM | POA: Diagnosis not present

## 2020-01-12 DIAGNOSIS — F3132 Bipolar disorder, current episode depressed, moderate: Secondary | ICD-10-CM

## 2020-01-15 ENCOUNTER — Encounter: Payer: Medicare Other | Admitting: Podiatry

## 2020-01-20 ENCOUNTER — Ambulatory Visit: Payer: Medicare Other | Admitting: Psychology

## 2020-01-26 ENCOUNTER — Ambulatory Visit (INDEPENDENT_AMBULATORY_CARE_PROVIDER_SITE_OTHER): Payer: Medicare Other

## 2020-01-26 ENCOUNTER — Other Ambulatory Visit: Payer: Self-pay

## 2020-01-26 DIAGNOSIS — Z5181 Encounter for therapeutic drug level monitoring: Secondary | ICD-10-CM

## 2020-01-26 DIAGNOSIS — I480 Paroxysmal atrial fibrillation: Secondary | ICD-10-CM | POA: Diagnosis not present

## 2020-01-26 LAB — POCT INR: INR: 2 (ref 2.0–3.0)

## 2020-01-26 NOTE — Patient Instructions (Signed)
Description   Take 2 tablets tomorrow, then resume same dosage of Warfarin 1.5 tablets daily except for 2 tablets on Mondays. Recheck INR in 4 weeks. Call with any medication changes. Coumadin Clinic [phone] Main [phone]

## 2020-01-27 NOTE — Progress Notes (Signed)
  Subjective:  Patient ID: Meredith [name], female    DOB: 04-Jan-1951,  MRN: [id number]  Chief Complaint  Patient presents with  . Routine Post Op    i am still swelling and it is sore on the right foot     DOS: 11/11/19 Procedure: [name] bunionectomy with implant left, hallux IPJ arthroplasty   69 y.o. female presents with the above complaint. History confirmed with patient. Only has some swelling and occasional soreness. Does not need refill of pain medication today.  Objective:  Physical Exam: mild tenderness at the surgical site, local edema noted and calf supple, nontender. Incision: well healed. No images are attached to the encounter.  Assessment:   1. Hallux malleus of right foot   2. Hallux rigidus of right foot   3. Post-operative state     Plan:  Patient was evaluated and treated and all questions answered.  Post-operative State -Doing well overall. Continue working in normal shoegear. -Discussed continued ROM exercises. -Will get new XR in 2 months.  Return in about 2 months (around 03/10/2020) for follow up right foot joint replacement.

## 2020-01-28 ENCOUNTER — Other Ambulatory Visit: Payer: Self-pay

## 2020-01-28 ENCOUNTER — Ambulatory Visit (INDEPENDENT_AMBULATORY_CARE_PROVIDER_SITE_OTHER): Payer: Medicare Other | Admitting: Psychiatry

## 2020-01-28 ENCOUNTER — Encounter: Payer: Self-pay | Admitting: Psychiatry

## 2020-01-28 DIAGNOSIS — F4001 Agoraphobia with panic disorder: Secondary | ICD-10-CM

## 2020-01-28 DIAGNOSIS — I639 Cerebral infarction, unspecified: Secondary | ICD-10-CM | POA: Diagnosis not present

## 2020-01-28 DIAGNOSIS — G251 Drug-induced tremor: Secondary | ICD-10-CM | POA: Diagnosis not present

## 2020-01-28 DIAGNOSIS — F5105 Insomnia due to other mental disorder: Secondary | ICD-10-CM

## 2020-01-28 DIAGNOSIS — F411 Generalized anxiety disorder: Secondary | ICD-10-CM | POA: Diagnosis not present

## 2020-01-28 DIAGNOSIS — F3132 Bipolar disorder, current episode depressed, moderate: Secondary | ICD-10-CM | POA: Diagnosis not present

## 2020-01-28 DIAGNOSIS — G3184 Mild cognitive impairment, so stated: Secondary | ICD-10-CM | POA: Diagnosis not present

## 2020-01-28 DIAGNOSIS — F3181 Bipolar II disorder: Secondary | ICD-10-CM

## 2020-01-28 DIAGNOSIS — F4310 Post-traumatic stress disorder, unspecified: Secondary | ICD-10-CM

## 2020-01-28 MED ORDER — ARIPIPRAZOLE 30 MG PO TABS: 30.0000 mg | ORAL_TABLET | Freq: Every day | ORAL | 5 refills | Status: DC

## 2020-01-28 MED ORDER — LITHIUM CARBONATE 150 MG PO CAPS: 150.0000 mg | ORAL_CAPSULE | Freq: Every day | ORAL | 0 refills | Status: DC

## 2020-01-28 NOTE — Patient Instructions (Signed)
Reduce lithium to 300 mg only on Sunday, Tuesday, Thursday, and Saturday On Monday, Wednesday, Friday take 300 mg AND 150 mg capsules of lithium

## 2020-01-28 NOTE — Progress Notes (Signed)
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 Advised pt to contact office if movement side effects occur.   Consider increasing lithium but she's prone to toxicity. May 20, 2019 lithium level 0.9 on 450 mg daily and creatinine was 1.1 with normal calcium 07/21/19 lithium 0.4 on 450 mg daily, normal CMP 10/01/19 lithium level 0.8 at [facility] scanned in 12/01/19 Lithium level 1.0 on 450 mg every afternoon. Creatinine 1.1 and calcium 9.8.  DT tremor: Reduce lithium to 300 mg only on Sunday, Tuesday, Thursday, and Saturday On Monday, Wednesday, Friday take 300 mg AND 150 mg capsules of lithium  OK Ativan prn for dental procedure 1-2 mg.  No driving after it.  She stopped diphenhydramine successfully after seeing Dr. Harold Hedge 07/07/19.  PCP Elyn Peers, Genelle Bal  Continue therapy with Dr. Cheryln Manly q 2 weeks.  Bring meds to office.  Follow-up 8 weeks   Lynder Parents MD, DFAPA  Please see After Visit Summary for patient specific instructions.  Future Appointments  Date Time Provider [place]  02/03/2020  1:00 PM Oren Binet, PhD [facility] None  02/17/2020  1:00 PM Oren Binet, PhD [facility] None  02/29/2020  1:45 PM [facility]  02/29/2020  2:20 PM [name], Wonda Cheng, MD [facility]  03/02/2020  1:00 PM Oren Binet, PhD [facility] None  03/11/2020 11:15 AM Evelina Bucy, DPM [facility]  03/16/2020  1:00 PM Oren Binet, PhD [facility] None  03/21/2020  1:00 PM Alphonzo Severance [facility] None  03/21/2020  2:00 PM [facility] None  03/28/2020  2:30 PM Alphonzo Severance [facility] None  03/30/2020  1:00 PM Oren Binet, PhD [facility] None  04/13/2020  1:00 PM Oren Binet, PhD [facility] None  04/27/2020  1:00 PM Oren Binet, PhD [facility] None  05/11/2020  1:00 PM Oren Binet, PhD [facility] None    No orders of the defined types were placed in this encounter.     -------------------------------

## 2020-02-01 DIAGNOSIS — K589 Irritable bowel syndrome without diarrhea: Secondary | ICD-10-CM | POA: Diagnosis not present

## 2020-02-01 DIAGNOSIS — I480 Paroxysmal atrial fibrillation: Secondary | ICD-10-CM | POA: Diagnosis not present

## 2020-02-01 DIAGNOSIS — K5909 Other constipation: Secondary | ICD-10-CM | POA: Diagnosis not present

## 2020-02-01 DIAGNOSIS — Z1211 Encounter for screening for malignant neoplasm of colon: Secondary | ICD-10-CM | POA: Diagnosis not present

## 2020-02-01 DIAGNOSIS — Z7901 Long term (current) use of anticoagulants: Secondary | ICD-10-CM | POA: Diagnosis not present

## 2020-02-02 ENCOUNTER — Telehealth: Payer: Self-pay | Admitting: *Deleted

## 2020-02-02 NOTE — Telephone Encounter (Signed)
   Primary Cardiologist: Mertie Moores, MD  Chart reviewed as part of pre-operative protocol coverage. Because of Meredith [name]'s past medical history and time since last visit, she will require a follow-up visit in order to better assess preoperative cardiovascular risk.  Pre-op covering staff: - Please schedule appointment and call patient to inform them. If patient already had an upcoming appointment within acceptable timeframe, please add "pre-op clearance" to the appointment notes so provider is aware.  Pt has an appointment with Dr Acie Fredrickson on 03/01/2019   please contact requesting surgeon's office via preferred method (i.e, phone, fax) to inform them of need for appointment prior to surgery.  If applicable, this message will also be routed to pharmacy pool and/or primary cardiologist for input on holding anticoagulant/antiplatelet agent as requested below so that this information is available to the clearing provider at time of patient's appointment.   [name], PA-C  02/02/2020, 9:59 AM

## 2020-02-02 NOTE — Telephone Encounter (Signed)
   [facility] Pre-operative Risk Assessment    HEARTCARE STAFF: - Please ensure there is not already an duplicate clearance open for this procedure. - Under Visit Info/Reason for Call, type in Other and utilize the format Clearance MM/DD/YY or Clearance TBD. Do not use dashes or single digits. - If request is for dental extraction, please clarify the # of teeth to be extracted.  Request for surgical clearance:  1. What type of surgery is being performed?  COLONOSCOPY   2. When is this surgery scheduled?  03/23/20   3. What type of clearance is required (medical clearance vs. Pharmacy clearance to hold med vs. Both)?  BOTH  4. Are there any medications that need to be held prior to surgery and how long? COUMADIN X'S 5 DAYS   5. Practice name and name of physician performing surgery?  [facility]     6. What is the office phone number?  [phone]   7.   What is the office fax number?  [phone]  8.   Anesthesia type (None, local, MAC, general) ?  PROPOFOL   Meredith [name] 02/02/2020, 9:26 AM  _________________________________________________________________   (provider comments below)

## 2020-02-03 ENCOUNTER — Ambulatory Visit (INDEPENDENT_AMBULATORY_CARE_PROVIDER_SITE_OTHER): Payer: Medicare Other | Admitting: Psychology

## 2020-02-03 DIAGNOSIS — F3181 Bipolar II disorder: Secondary | ICD-10-CM | POA: Diagnosis not present

## 2020-02-09 DIAGNOSIS — G518 Other disorders of facial nerve: Secondary | ICD-10-CM | POA: Diagnosis not present

## 2020-02-09 DIAGNOSIS — G43719 Chronic migraine without aura, intractable, without status migrainosus: Secondary | ICD-10-CM | POA: Diagnosis not present

## 2020-02-09 DIAGNOSIS — M791 Myalgia, unspecified site: Secondary | ICD-10-CM | POA: Diagnosis not present

## 2020-02-09 DIAGNOSIS — M542 Cervicalgia: Secondary | ICD-10-CM | POA: Diagnosis not present

## 2020-02-11 DIAGNOSIS — M797 Fibromyalgia: Secondary | ICD-10-CM | POA: Diagnosis not present

## 2020-02-11 DIAGNOSIS — G894 Chronic pain syndrome: Secondary | ICD-10-CM | POA: Diagnosis not present

## 2020-02-11 DIAGNOSIS — M47814 Spondylosis without myelopathy or radiculopathy, thoracic region: Secondary | ICD-10-CM | POA: Diagnosis not present

## 2020-02-11 DIAGNOSIS — M47816 Spondylosis without myelopathy or radiculopathy, lumbar region: Secondary | ICD-10-CM | POA: Diagnosis not present

## 2020-02-17 ENCOUNTER — Ambulatory Visit (INDEPENDENT_AMBULATORY_CARE_PROVIDER_SITE_OTHER): Payer: Medicare Other | Admitting: Psychology

## 2020-02-17 DIAGNOSIS — F3181 Bipolar II disorder: Secondary | ICD-10-CM

## 2020-02-23 ENCOUNTER — Other Ambulatory Visit: Payer: Self-pay | Admitting: Psychiatry

## 2020-02-23 ENCOUNTER — Telehealth: Payer: Self-pay | Admitting: Psychiatry

## 2020-02-23 DIAGNOSIS — F3132 Bipolar disorder, current episode depressed, moderate: Secondary | ICD-10-CM

## 2020-02-23 MED ORDER — QUETIAPINE FUMARATE 200 MG PO TABS: 200.0000 mg | ORAL_TABLET | Freq: Every day | ORAL | 3 refills | Status: DC

## 2020-02-23 NOTE — Telephone Encounter (Signed)
Pt called and needs a refill on her seroquel 200 mg to be sent to the [facility] on [address]. She is completely out

## 2020-02-28 ENCOUNTER — Other Ambulatory Visit: Payer: Self-pay | Admitting: Cardiovascular Disease

## 2020-02-28 ENCOUNTER — Encounter: Payer: Self-pay | Admitting: Cardiovascular Disease

## 2020-02-28 NOTE — Progress Notes (Signed)
Cardiology Office Note   Date:  02/29/2020   ID:  Meredith [name], Meredith [name] 01/29/1951, MRN [id number]  PCP:  Kristen Loader, FNP  Cardiologist:   Mertie Moores, MD   Chief Complaint  Patient presents with  . Atrial Fibrillation   Problem list 1. Paroxysmal atrial fibrillation 2. Hyperlipidemia 3.  Hypothyroidism 4.  Migraine headaches     Meredith [name] is a 70 y.o. female who presents for follow up of her paroxysmal atrial fib.  Previously from  [place], [place]  Has had 2 A. fib ablations and has had a more recent cryoablation. She was on [place] until a year ago .  Has not had any recurrent atrial fibrillation since that time.  She is on verapamil for history of migraine headaches.  She cannot tell when she is in atrial fib.   Usually has a very fast HR  Gets some exercise on rare occasion.     Oct. 4, 2018:    Doing well from a cardiac standpoint. Was in [place] for 5 days Has lots of anxiety   Nov. 4, 2019:  Meredith [name] is seen back today for follow-up of her history of atrial fibrillation.  She is had 3 separate ablations for atrial fibrillation.   She was recently hospitalized for altered mental status.   She had fallen and hit her head on the cement.      She agreed to take apixaban 5 mg twice a day following that hospitalization.   She took it for 30 days and then stopped   Dec. 3, 2020  Pt was hospitalized for acute encephalopathy.   Brain MRI showed no stroke. 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  Is on warfarin now   Jan. 3, 2022. Meredith [name] is seen for follow up of her atrial fib  She is on warfarin Stable from a cardiac standpoint  Getting some exercise.   Walks with a cane ( has balance issues ) falls frequently  She needs to have a colonocsopy.  Is on coumadin .   She will be at low risk and may hold her coumadin for 5 days prior to colonoscopy    Past Medical History:  Diagnosis Date  . Anxiety   . Atrial fibrillation ([place])   . Bipolar 2 disorder ([place])   . Depression   . History of cardioversion    x2  . Hyperlipidemia   . Hypothyroidism   . Migraine headache   . Osteoporosis     Past Surgical History:  Procedure Laterality Date  . ABLATION    . BACK SURGERY  2014  . CARDIOVERSION  12/2013, 01/2014   x2  . KNEE SURGERY     ORTHOSCOPIC  . LUNG REMOVAL, PARTIAL Right    BENIGN LUNG CYST  . THORACIC SPINE SURGERY Right   . THUMB ARTHROSCOPY       Current Outpatient Medications  Medication Sig Dispense Refill  . ARIPiprazole (ABILIFY) 30 MG tablet Take 1 tablet (30 mg total) by mouth daily. 30 tablet 5  . Ascorbic Acid (VITAMIN C) 1000 MG tablet Take 1,000 mg by mouth daily.    [name] aspirin 325 MG tablet Take 325 mg by mouth daily as needed for headache.     . baclofen (LIORESAL) 10 MG tablet Take 1 tablet by mouth as needed.    . Cholecalciferol (VITAMIN D3) 125 MCG (5000 UT) TABS  Take 1 tablet by mouth daily at 12 noon.    . clotrimazole (LOTRIMIN) 1 % cream APPLY CREAM TOPICALLY TO AFFECTED AREA TWICE DAILY    . EPINEPHrine 0.3 mg/0.3 mL IJ SOAJ injection SMARTSIG:1 Pre-Filled Pen Syringe IM PRN    . Erenumab-aooe (AIMOVIG) 140 MG/ML SOAJ Inject 140 mg into the skin every 30 (thirty) days.    [name] estradiol (ESTRACE) 1 MG tablet Take 1 mg by mouth daily.    . ferrous sulfate 325 (65 FE) MG tablet Take 325 mg by mouth 2 (two) times daily.    . fluticasone (FLONASE SENSIMIST) 27.5 MCG/SPRAY nasal spray Place 1 spray into the nose daily.    [name] HYDROcodone-acetaminophen (NORCO) 7.5-325 MG tablet Take 1 tablet by mouth every 8 (eight) hours as needed for moderate pain.    [name] lamoTRIgine (LAMICTAL) 100 MG tablet Take 1 tablet (100 mg total) by mouth 2 (two) times daily. 60 tablet 5  . levocetirizine (XYZAL) 5 MG tablet Take 5 mg by mouth every evening.    [name] levothyroxine (SYNTHROID) 112 MCG tablet Take 112 mcg by mouth daily  before breakfast.    . lithium carbonate 150 MG capsule Take 1 capsule (150 mg total) by mouth daily. 90 capsule 0  . lithium carbonate 300 MG capsule Take 1 capsule by mouth at bedtime 90 capsule 0  . loperamide (IMODIUM) 2 MG capsule Take 1 capsule (2 mg total) by mouth as needed for diarrhea or loose stools. 30 capsule 0  . Melatonin 5 MG TABS Take 15 mg by mouth at bedtime.    . metoprolol succinate (TOPROL-XL) 50 MG 24 hr tablet TAKE 1 TABLET BY MOUTH ONCE DAILY WITH  OR  IMMEDIATELY  FOLLOWING  A  MEAL 30 tablet 11  . naloxone (NARCAN) 0.4 MG/ML injection     . ondansetron (ZOFRAN) 4 MG tablet     . ondansetron (ZOFRAN) 4 MG tablet Take 1 tablet (4 mg total) by mouth every 8 (eight) hours as needed for nausea or vomiting. 20 tablet 0  . pantoprazole (PROTONIX) 40 MG tablet Take 1 tablet (40 mg total) by mouth daily. 90 tablet 0  . pravastatin (PRAVACHOL) 20 MG tablet Take 20 mg by mouth daily.  3  . pregabalin (LYRICA) 150 MG capsule Take 150 mg by mouth 3 (three) times daily.    . QUEtiapine (SEROQUEL) 200 MG tablet Take 1 tablet (200 mg total) by mouth at bedtime. 30 tablet 3  . senna (SENOKOT) 8.6 MG TABS tablet Take 2 capsules by mouth every other day.    . senna-docusate (PERI-COLACE) 8.6-50 MG tablet Take 2 tablets by mouth 2 (two) times daily. For constipation    . verapamil (VERELAN PM) 120 MG 24 hr capsule Take 1 capsule (120 mg total) by mouth at bedtime. For high blood pressure    . warfarin (COUMADIN) 2.5 MG tablet TAKE AS DIRECTED BY COUMADIN CLINIC 160 tablet 0   No current facility-administered medications for this visit.    Allergies:   Codeine, Adhesive [tape], Celebrex [celecoxib], Sulfa antibiotics, Tricyclic antidepressants, Amoxicillin, Ampicillin, Cephalexin, and Penicillins    Social History:  The patient  reports that she has never smoked. She has never used smokeless tobacco. She reports that she does not drink alcohol and does not use drugs.   Family History:   The patient's family history includes Alcohol abuse in her brother; Arthritis in her mother; Asthma in her brother; Atrial fibrillation in her brother; Depression in her mother; Diabetes  in her mother; Drug abuse in her brother; Early death in her father; Heart disease in her father and mother; Hyperlipidemia in her brother; Hypertension in her brother; Mental illness in her brother; Multiple sclerosis in her sister; Stroke in her mother.    ROS:  Please see the history of present illness.     Physical Exam: Blood pressure (!) 143/69, pulse 75, height 5' (1.524 m), weight 175 lb (79.4 kg), SpO2 94 %.  GEN:  Middle age female  HEENT: Normal NECK: No JVD; No carotid bruits LYMPHATICS: No lymphadenopathy CARDIAC: RRR , no murmurs, rubs, gallops RESPIRATORY:  Clear to auscultation without rales, wheezing or rhonchi  ABDOMEN: Soft, non-tender, non-distended MUSCULOSKELETAL:  No edema; No deformity  SKIN: Warm and dry NEUROLOGIC:  Alert and oriented x 3, somewhat unsteady on her feet.  She walks with a cane.   EKG:    February 29, 2020: Normal sinus rhythm at 65.  No ST or T wave changes.   Recent Labs: 07/15/2019: ALT 14; TSH 0.341 07/19/2019: Magnesium 2.3 07/20/2019: Hemoglobin 11.8; Platelets 217 12/07/2019: BUN 15; Creatinine, Ser 1.10; Potassium 4.9; Sodium 141    Lipid Panel    Component Value Date/Time   CHOL 212 (H) 10/23/2018 0416   TRIG 103 10/23/2018 0416   HDL 94 10/23/2018 0416   CHOLHDL 2.3 10/23/2018 0416   VLDL 21 10/23/2018 0416   LDLCALC 97 10/23/2018 0416      Wt Readings from Last 3 Encounters:  02/29/20 175 lb (79.4 kg)  07/19/19 169 lb 12.1 oz (77 kg)  01/29/19 166 lb 8 oz (75.5 kg)      Other studies Reviewed: Additional studies/ records that were reviewed today include: . Review of the above records demonstrates:    ASSESSMENT AND PLAN:  1.  Paroxysmal atrial fib:   Has had 3 a-fib ablations  ( 2 regular ablations and 1 cryo- ablation)   CHADS2VASC = 2  ( female, age)   On warfarin.  She needs to have a colonoscopy.  She will be at low risk for her upcoming colonoscopy.  It will be okay to hold her Coumadin for 5 days prior to her colonoscopy and restart as soon as her GI doctor gives her the okay.      Current medicines are reviewed at length with the patient today.  The patient does not have concerns regarding medicines.  Labs/ tests ordered today include:  No orders of the defined types were placed in this encounter.   Disposition:     Will have her follow up with Dr. Izora Ribas in a year.    Kristeen Miss, MD  02/29/2020 2:31 PM    [facility] [address] [place], [place], [place]  [address] Phone: [phone]; Fax: [phone]

## 2020-02-29 ENCOUNTER — Ambulatory Visit (INDEPENDENT_AMBULATORY_CARE_PROVIDER_SITE_OTHER): Payer: Medicare Other | Admitting: Cardiovascular Disease

## 2020-02-29 ENCOUNTER — Ambulatory Visit (INDEPENDENT_AMBULATORY_CARE_PROVIDER_SITE_OTHER): Payer: Medicare Other | Admitting: Pharmacist

## 2020-02-29 ENCOUNTER — Other Ambulatory Visit: Payer: Self-pay

## 2020-02-29 ENCOUNTER — Encounter: Payer: Self-pay | Admitting: Cardiovascular Disease

## 2020-02-29 VITALS — BP 143/69 | HR 75 | Ht 60.0 in | Wt 175.0 lb

## 2020-02-29 DIAGNOSIS — R079 Chest pain, unspecified: Secondary | ICD-10-CM

## 2020-02-29 DIAGNOSIS — Z5181 Encounter for therapeutic drug level monitoring: Secondary | ICD-10-CM | POA: Diagnosis not present

## 2020-02-29 DIAGNOSIS — E782 Mixed hyperlipidemia: Secondary | ICD-10-CM

## 2020-02-29 DIAGNOSIS — I480 Paroxysmal atrial fibrillation: Secondary | ICD-10-CM

## 2020-02-29 DIAGNOSIS — E039 Hypothyroidism, unspecified: Secondary | ICD-10-CM | POA: Diagnosis not present

## 2020-02-29 LAB — POCT INR: INR: 2.2 (ref 2.0–3.0)

## 2020-02-29 MED ORDER — METOPROLOL SUCCINATE ER 50 MG PO TB24: 50.0000 mg | ORAL_TABLET | Freq: Every day | ORAL | 3 refills | Status: DC

## 2020-02-29 NOTE — Patient Instructions (Signed)
Medication Instructions:  Your physician recommends that you continue on your current medications as directed. Please refer to the Current Medication list given to you today.  *If you need a refill on your cardiac medications before your next appointment, please call your pharmacy*   Lab Work: None ordered.  If you have labs (blood work) drawn today and your tests are completely normal, you will receive your results only by: [name] MyChart Message (if you have MyChart) OR . A paper copy in the mail If you have any lab test that is abnormal or we need to change your treatment, we will call you to review the results.   Testing/Procedures: None ordered.    Follow-Up: At [facility], you and your health needs are our priority.  As part of our continuing mission to provide you with exceptional heart care, we have created designated Provider Care Teams.  These Care Teams include your primary Cardiologist (physician) and Advanced Practice Providers (APPs -  Physician Assistants and Nurse Practitioners) who all work together to provide you with the care you need, when you need it.  We recommend signing up for the patient portal called "MyChart".  Sign up information is provided on this After Visit Summary.  MyChart is used to connect with patients for Virtual Visits (Telemedicine).  Patients are able to view lab/test results, encounter notes, upcoming appointments, etc.  Non-urgent messages can be sent to your provider as well.   To learn more about what you can do with MyChart, go to [email or web address].    Your next appointment:   12 month(s)  The format for your next appointment:   In Person  Provider:   Riley Lam, MD

## 2020-02-29 NOTE — Telephone Encounter (Signed)
   Primary Cardiologist: Kristeen Miss, MD  Chart reviewed as part of pre-operative protocol coverage. Per Dr. Elease Hashimoto, Meredith [name] would be at acceptable risk for the planned procedure without further cardiovascular testing.   She can hold coumadin 5 days prior to her procedure with plans to restart as soon as she is cleared to do so by her gastroenterologist.   I will route this recommendation to the requesting party via Epic fax function and remove from pre-op pool.  Please call with questions.  [name], PA-C 02/29/2020, 3:07 PM

## 2020-02-29 NOTE — Telephone Encounter (Signed)
Ms. Golliday is at low risk for her colonoscopy. She may hold her warfarin for 5 days prior to her procedure     Kristeen Miss, MD  02/29/2020 2:39 PM    [facility] [address] [place],  [address] [place], [place]  [address] Phone: [phone]; Fax: [phone]

## 2020-02-29 NOTE — Patient Instructions (Addendum)
Continue taking same dosage of Warfarin 1.5 tablets daily except for 2 tablets on Mondays. Hold warfarin for 5 days prior to procedure. Recheck INR in 5 weeks. Call with any medication changes. [facility] [phone] Main [phone]

## 2020-03-02 ENCOUNTER — Ambulatory Visit (INDEPENDENT_AMBULATORY_CARE_PROVIDER_SITE_OTHER): Payer: Medicare Other | Admitting: Psychology

## 2020-03-02 DIAGNOSIS — F3181 Bipolar II disorder: Secondary | ICD-10-CM | POA: Diagnosis not present

## 2020-03-08 DIAGNOSIS — J300 Vasomotor rhinitis: Secondary | ICD-10-CM | POA: Diagnosis not present

## 2020-03-08 DIAGNOSIS — T63481D Toxic effect of venom of other arthropod, accidental (unintentional), subsequent encounter: Secondary | ICD-10-CM | POA: Diagnosis not present

## 2020-03-08 DIAGNOSIS — J3089 Other allergic rhinitis: Secondary | ICD-10-CM | POA: Diagnosis not present

## 2020-03-09 DIAGNOSIS — G518 Other disorders of facial nerve: Secondary | ICD-10-CM | POA: Diagnosis not present

## 2020-03-09 DIAGNOSIS — G43719 Chronic migraine without aura, intractable, without status migrainosus: Secondary | ICD-10-CM | POA: Diagnosis not present

## 2020-03-09 DIAGNOSIS — M542 Cervicalgia: Secondary | ICD-10-CM | POA: Diagnosis not present

## 2020-03-09 DIAGNOSIS — M791 Myalgia, unspecified site: Secondary | ICD-10-CM | POA: Diagnosis not present

## 2020-03-10 DIAGNOSIS — J3089 Other allergic rhinitis: Secondary | ICD-10-CM | POA: Diagnosis not present

## 2020-03-10 DIAGNOSIS — T63481D Toxic effect of venom of other arthropod, accidental (unintentional), subsequent encounter: Secondary | ICD-10-CM | POA: Diagnosis not present

## 2020-03-11 ENCOUNTER — Ambulatory Visit (INDEPENDENT_AMBULATORY_CARE_PROVIDER_SITE_OTHER): Payer: Medicare Other

## 2020-03-11 ENCOUNTER — Ambulatory Visit (INDEPENDENT_AMBULATORY_CARE_PROVIDER_SITE_OTHER): Payer: Medicare Other | Admitting: Podiatry

## 2020-03-11 ENCOUNTER — Other Ambulatory Visit: Payer: Self-pay

## 2020-03-11 DIAGNOSIS — J300 Vasomotor rhinitis: Secondary | ICD-10-CM | POA: Insufficient documentation

## 2020-03-11 DIAGNOSIS — J309 Allergic rhinitis, unspecified: Secondary | ICD-10-CM | POA: Insufficient documentation

## 2020-03-11 DIAGNOSIS — M8580 Other specified disorders of bone density and structure, unspecified site: Secondary | ICD-10-CM | POA: Diagnosis not present

## 2020-03-11 DIAGNOSIS — M2021 Hallux rigidus, right foot: Secondary | ICD-10-CM | POA: Diagnosis not present

## 2020-03-11 DIAGNOSIS — M2031 Hallux varus (acquired), right foot: Secondary | ICD-10-CM

## 2020-03-11 NOTE — Progress Notes (Signed)
  Subjective:  Patient ID: Meredith [name], female    DOB: 1950-04-18,  MRN: [id number]  Chief Complaint  Patient presents with  . Routine Post Op    POV#5 Pt states right 1st/2nd digit are tender/numb, right 1st "turning inward" and right 2nd "longer than 1st toe and is bent". Pt wants to discuss the timeline of healing.    DOS: 11/11/19 Procedure: Mateo Flow with implant right, hallux IPJ arthroplasty   70 y.o. female presents with the above complaint. History confirmed with patient.  History above confirmed with patient still having issues especially with recently the first toe started to bend towards the second toe.  Objective:  Physical Exam: mild tenderness at the surgical site, local edema noted and calf supple, nontender.  Hallux abductus deformity without warmth erythema or signs of infection Incision: well healed. No images are attached to the encounter.  Assessment:   1. Hallux rigidus of right foot   2. Hallux malleus of right foot   3. Bone erosion     Plan:  Patient was evaluated and treated and all questions answered.  Post-operative State -New x-rays taken today.  She does have an abrasion interphalangeal joint.  This is concerning for possible infection however she has remained without warmth swelling, or other signs concerning for infection.  At this point will get MRI to evaluate for possible signs of osteomyelitis.  We did discuss that she may benefit from further arthroplasty or arthrodesis of the digit.  We will discuss options after MRI.  Return in about 3 weeks (around 04/01/2020) for MRI f/u.

## 2020-03-16 ENCOUNTER — Ambulatory Visit (INDEPENDENT_AMBULATORY_CARE_PROVIDER_SITE_OTHER): Payer: Medicare Other | Admitting: Psychology

## 2020-03-16 DIAGNOSIS — F3181 Bipolar II disorder: Secondary | ICD-10-CM | POA: Diagnosis not present

## 2020-03-18 DIAGNOSIS — Z01812 Encounter for preprocedural laboratory examination: Secondary | ICD-10-CM | POA: Diagnosis not present

## 2020-03-19 IMAGING — CT CT ABD-PELV W/ CM
2 of 5 series · 13 of 36 positions shown, 16 images · IV contrast (APPLIED)
Comparison: None.

CLINICAL DATA: Acute mental status changes. Fall 9 days ago.
Abdominal blunt trauma.

EXAM:
CT CHEST, ABDOMEN, AND PELVIS WITH CONTRAST
TECHNIQUE: Multidetector CT imaging of the chest, abdomen and pelvis was
performed following the standard protocol during bolus
administration of intravenous contrast.
CONTRAST:  100mL OMNIPAQUE IOHEXOL 300 MG/ML  SOLN

[Series 3: cap 5.0 i31f 2 · axial · 0.94mm/px · z∈[-730,-234]mm · 10 of 123 slices shown, 13 images]
[im 12/123  mediastinal]
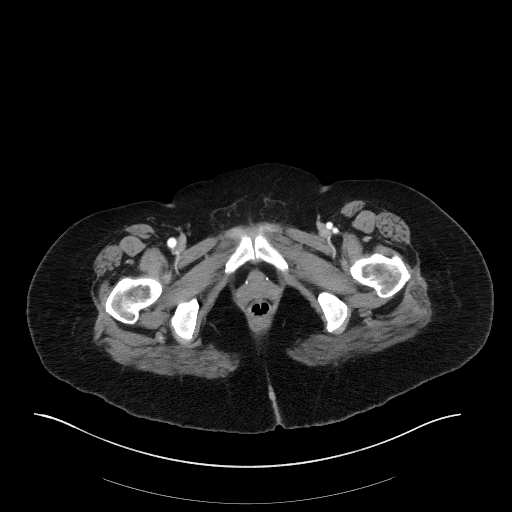
[im 12/123  lung]
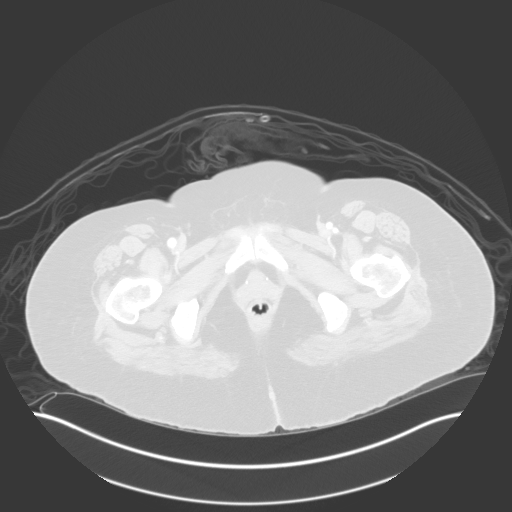
[im 23/123  lung]
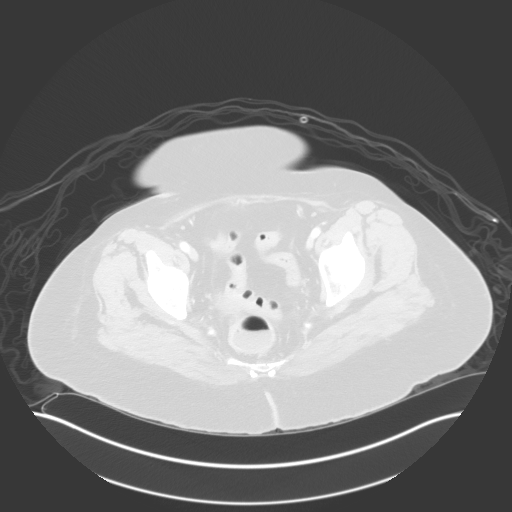
[im 34/123  lung]
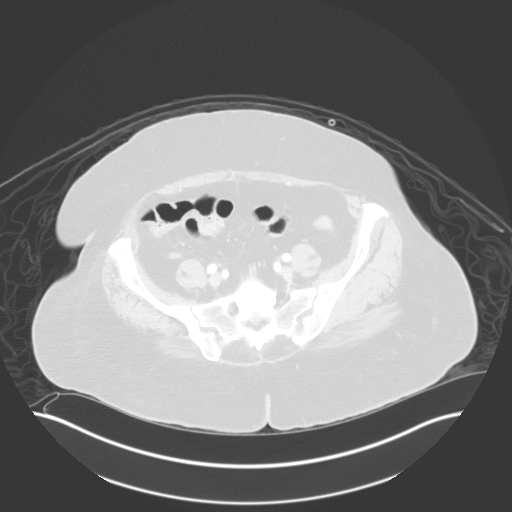
[im 45/123  lung]
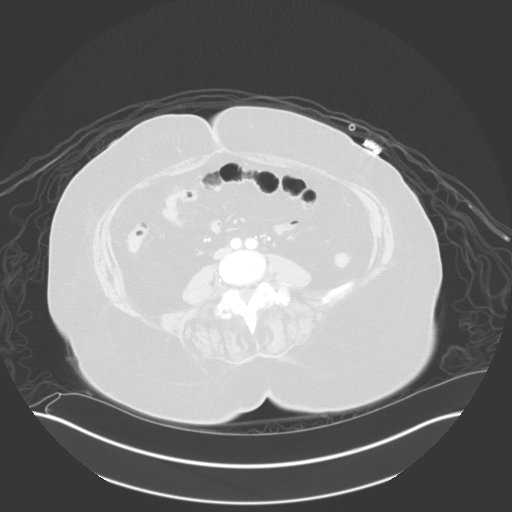
[im 56/123  mediastinal]
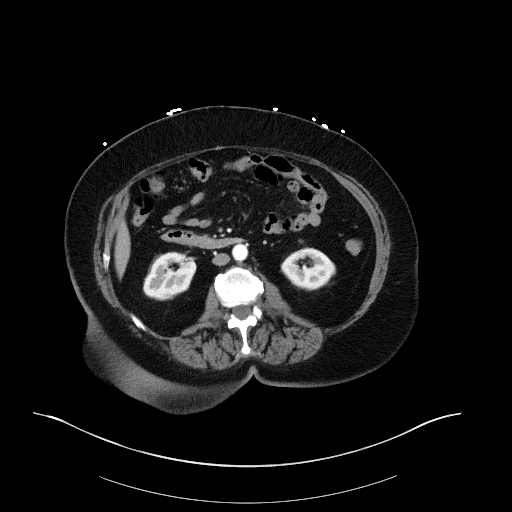
[im 56/123  lung]
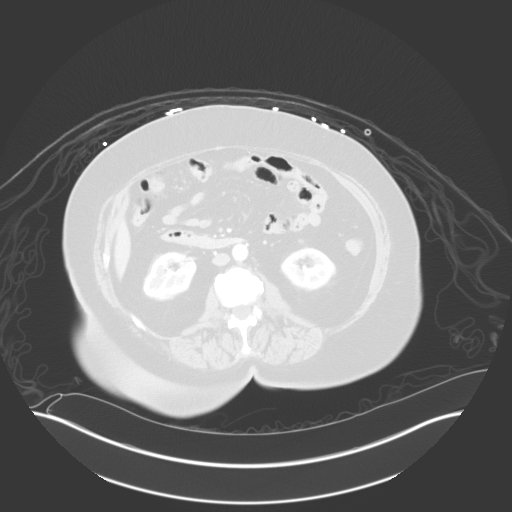
[im 67/123  lung]
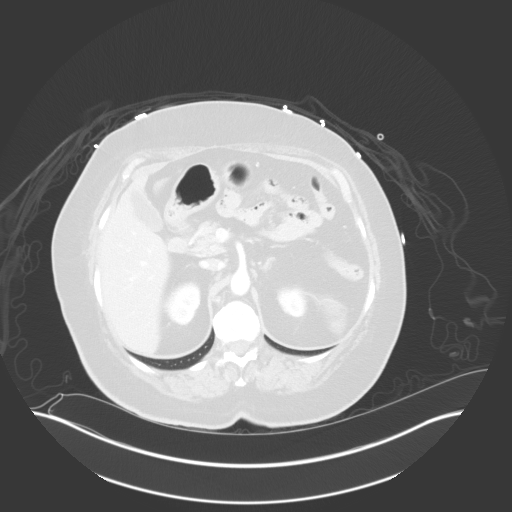
[im 78/123  lung]
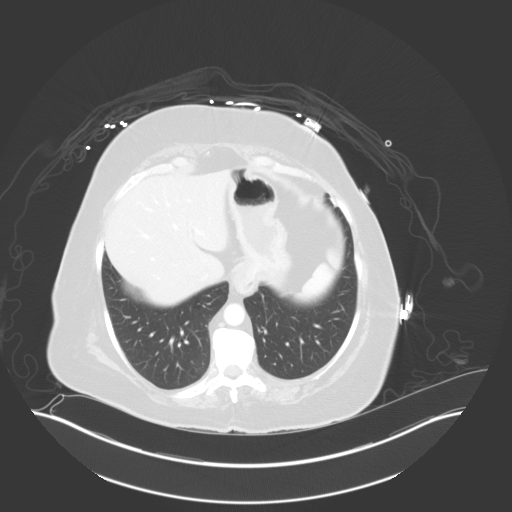
[im 89/123  lung]
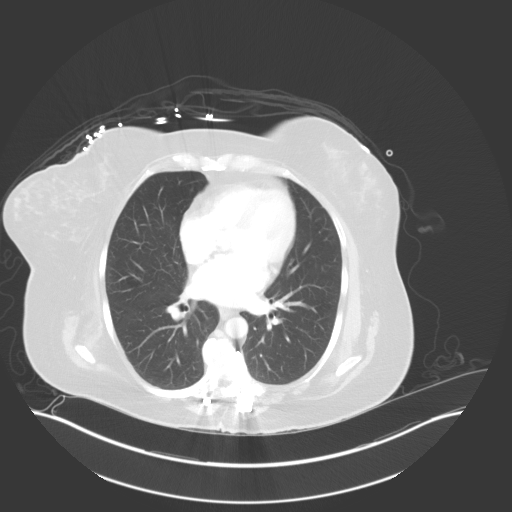
[im 100/123  mediastinal]
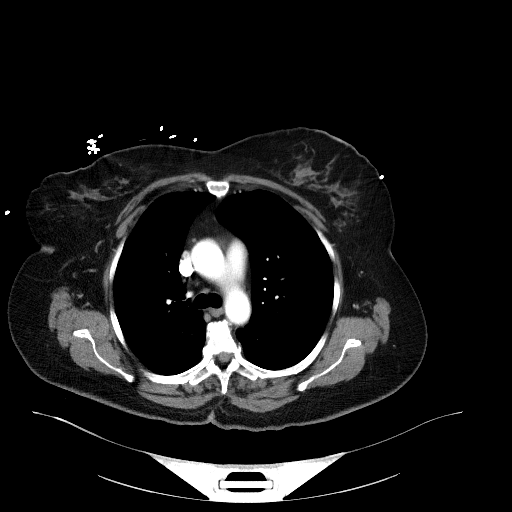
[im 100/123  lung]
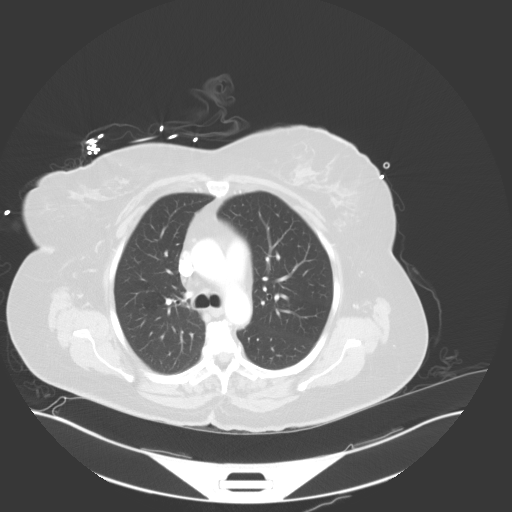
[im 111/123  lung]
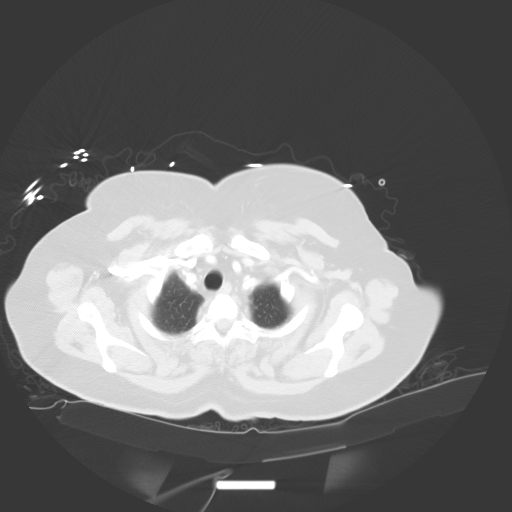

[Series 6: coronal · coronal · 0.82mm/px · 3 of 151 slices shown]
[im 31/151  lung]
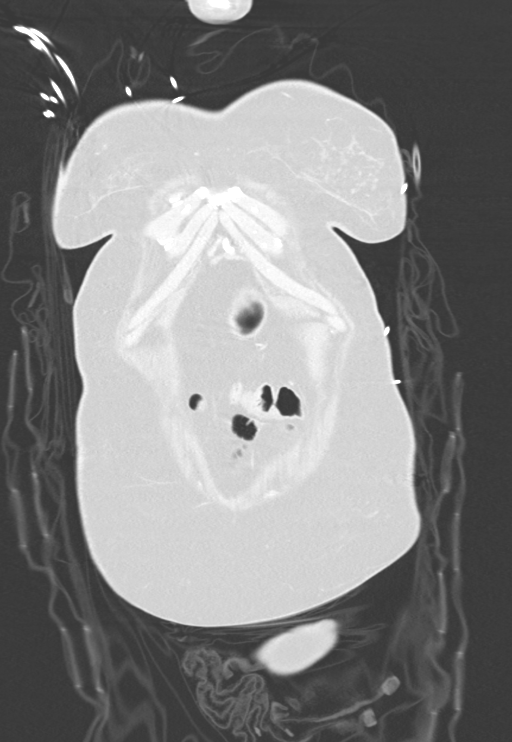
[im 61/151  lung]
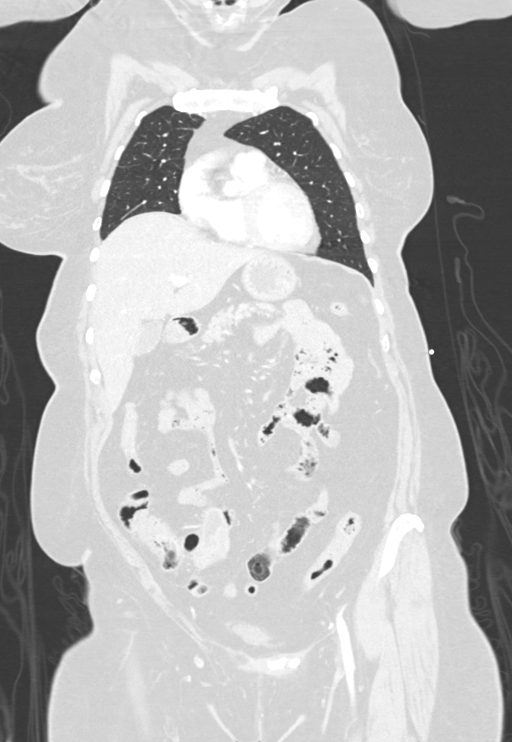
[im 91/151  lung]
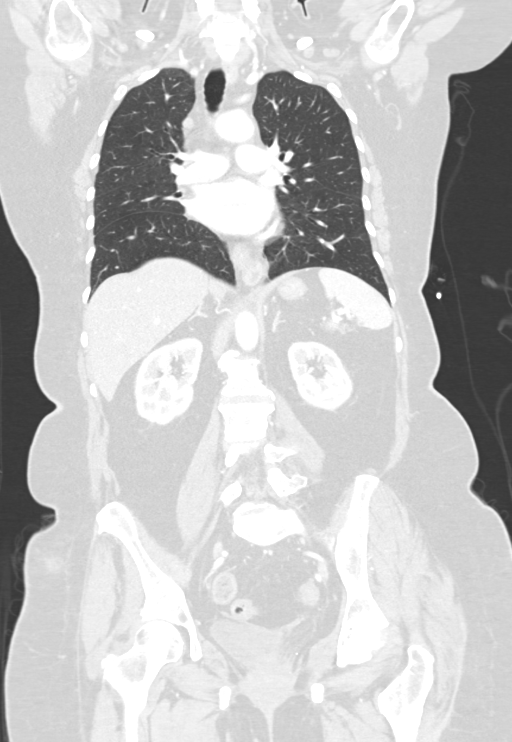

[13 of 36 positions shown; findings below may reference images not displayed]

FINDINGS: CT CHEST FINDINGS

Cardiovascular: The thoracic aorta is normal with no
atherosclerosis, aneurysm, or dissection. The central pulmonary
artery is normal. No filling defects in the visualized opacified
branches of pulmonary artery.

Mediastinum/Nodes: There is a tiny hiatal hernia. The esophagus and
thyroid are unremarkable. The chest wall is without obvious
abnormality. No adenopathy or effusion.

Lungs/Pleura: Atelectasis in the anterior right upper lobe. No
pneumothorax. Central airways are normal. No pulmonary nodules,
masses, or focal infiltrates.

Musculoskeletal: Pedicle rods and screws are seen in the thoracic
spine. No hardware failure identified. No rib fractures noted. No
other fracture seen within the thorax.

CT ABDOMEN PELVIS FINDINGS

Hepatobiliary: A few tiny lesions in the liver are too small to
characterize but almost certainly cysts or another benign etiology.
Hepatic steatosis. The portal vein is patent. The gallbladder is
unremarkable.

Pancreas: Unremarkable. No pancreatic ductal dilatation or
surrounding inflammatory changes.

Spleen: Normal in size without focal abnormality.

Adrenals/Urinary Tract: Adrenal glands are unremarkable. Kidneys are
normal, without renal calculi, focal lesion, or hydronephrosis.
Bladder is unremarkable.

Stomach/Bowel: Stomach is within normal limits. Appendix appears
normal. No evidence of bowel wall thickening, distention, or
inflammatory changes.

Vascular/Lymphatic: No significant vascular findings are present. No
enlarged abdominal or pelvic lymph nodes.

Reproductive: Status post hysterectomy. No adnexal masses.

Other: No abdominal wall hernia or abnormality. No abdominopelvic
ascites.

Musculoskeletal: No acute or significant osseous findings.
IMPRESSION: 1. No soft tissue or bony injury identified.
2. No other significant abnormalities.

## 2020-03-19 IMAGING — CT CT CERVICAL SPINE W/O CM
3 of 15 series · 5 of 33 positions shown, 6 images · non-contrast
Comparison: None.

CLINICAL DATA: Altered mental status, facial bruising after fall 9
days ago.

EXAM:
CT HEAD WITHOUT CONTRAST
CT MAXILLOFACIAL WITHOUT CONTRAST
CT CERVICAL SPINE WITHOUT CONTRAST
TECHNIQUE: Multidetector CT imaging of the head, cervical spine, and
maxillofacial structures were performed using the standard protocol
without intravenous contrast. Multiplanar CT image reconstructions
of the cervical spine and maxillofacial structures were also
generated.

[Series 14: sagittal bone · sagittal · 0.28mm/px · 1 of 76 slices shown]
[im 38/76  bone]
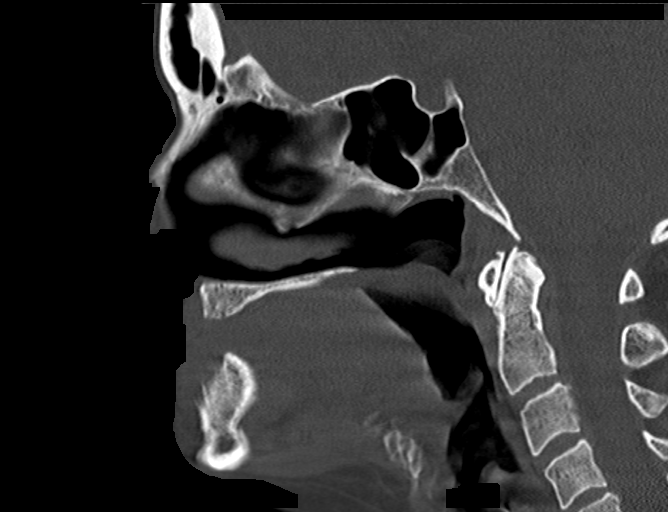

[Series 29: c_spine 2.0 st · axial · 0.37mm/px · z∈[-192,-138]mm · 2 of 82 slices shown, 3 images]
[im 28/82  soft-tissue]
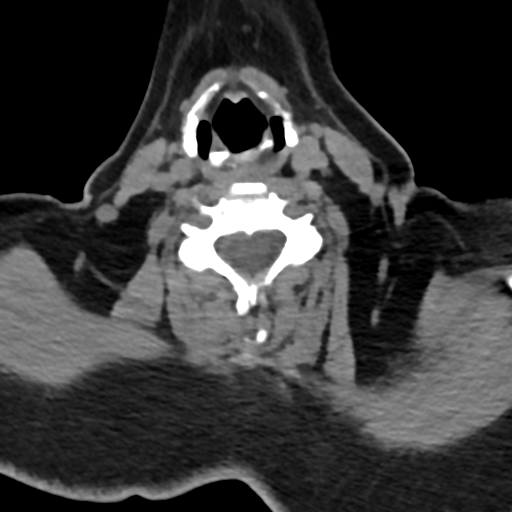
[im 28/82  bone]
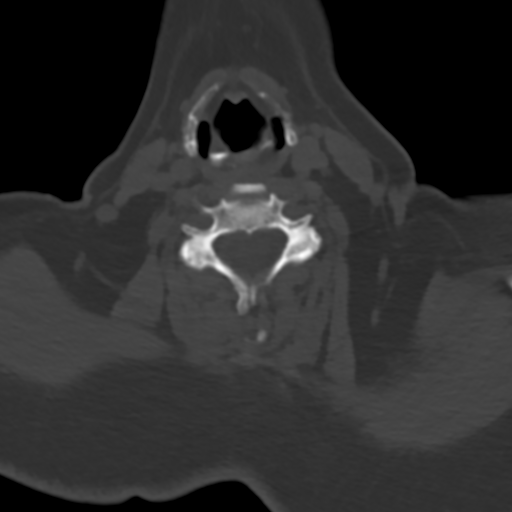
[im 55/82  bone]
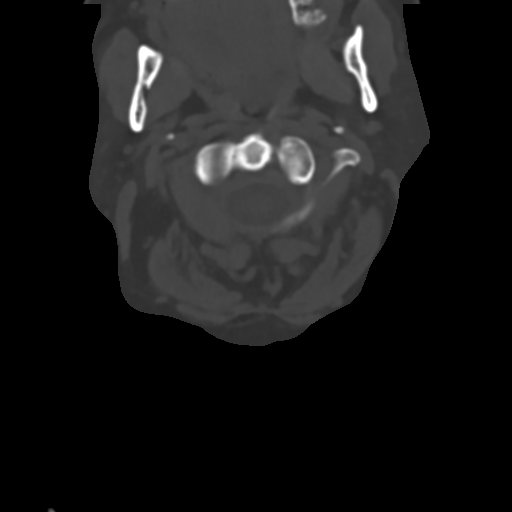

[Series 35: orthogonal st · axial · 0.21mm/px · z∈[-221,-171]mm · 2 of 80 slices shown]
[im 27/80  bone]
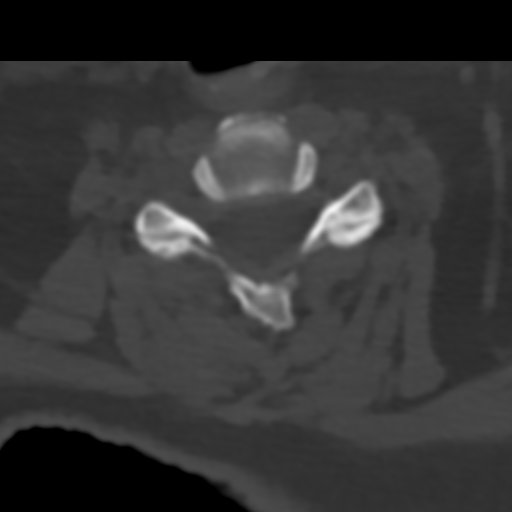
[im 53/80  bone]
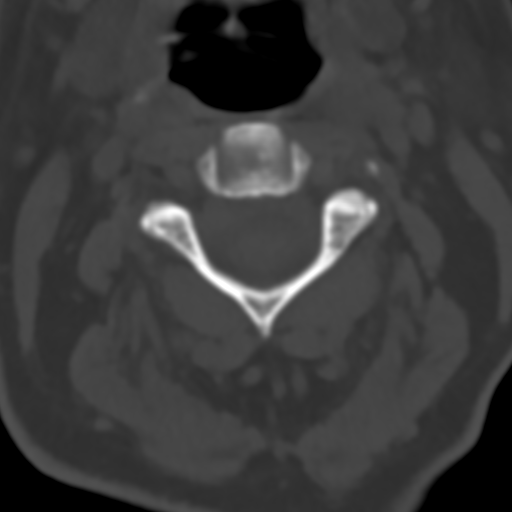

[5 of 33 positions shown; findings below may reference images not displayed]

FINDINGS: CT HEAD FINDINGS

Brain: Mild chronic ischemic white matter disease is noted. No mass
effect or midline shift is noted. Ventricular size is within normal
limits. There is no evidence of mass lesion, hemorrhage or acute
infarction.

Vascular: No hyperdense vessel or unexpected calcification.

Skull: Normal. Negative for fracture or focal lesion.

Other: None.

CT MAXILLOFACIAL FINDINGS

Osseous: No fracture or mandibular dislocation. No destructive
process.

Orbits: Negative. No traumatic or inflammatory finding.

Sinuses: Mild right maxillary mucosal thickening is noted. Remaining
paranasal sinuses are unremarkable.

Soft tissues: Negative.

CT CERVICAL SPINE FINDINGS

Alignment: Normal.

Skull base and vertebrae: No acute fracture. No primary bone lesion
or focal pathologic process.

Soft tissues and spinal canal: No prevertebral fluid or swelling. No
visible canal hematoma.

Disc levels:  None.

Upper chest: Negative.

Other: None.
IMPRESSION: Mild chronic ischemic white matter disease. No acute intracranial
abnormality seen.

Mild right maxillary sinus mucosal thickening is noted. No acute
abnormality seen in the maxillofacial region.

Normal cervical spine.

## 2020-03-21 ENCOUNTER — Ambulatory Visit: Payer: Medicare Other

## 2020-03-21 ENCOUNTER — Encounter: Payer: Self-pay | Admitting: Counselor

## 2020-03-21 ENCOUNTER — Ambulatory Visit (INDEPENDENT_AMBULATORY_CARE_PROVIDER_SITE_OTHER): Payer: Medicare Other | Admitting: Counselor

## 2020-03-21 ENCOUNTER — Other Ambulatory Visit: Payer: Self-pay

## 2020-03-21 DIAGNOSIS — F09 Unspecified mental disorder due to known physiological condition: Secondary | ICD-10-CM

## 2020-03-21 DIAGNOSIS — R7989 Other specified abnormal findings of blood chemistry: Secondary | ICD-10-CM | POA: Diagnosis not present

## 2020-03-21 DIAGNOSIS — F319 Bipolar disorder, unspecified: Secondary | ICD-10-CM | POA: Diagnosis not present

## 2020-03-21 DIAGNOSIS — Z8669 Personal history of other diseases of the nervous system and sense organs: Secondary | ICD-10-CM

## 2020-03-21 DIAGNOSIS — T63481D Toxic effect of venom of other arthropod, accidental (unintentional), subsequent encounter: Secondary | ICD-10-CM | POA: Diagnosis not present

## 2020-03-21 NOTE — Progress Notes (Signed)
   Psychometrist Note   Cognitive testing was administered to Meredith [name] by Lamar Benes, B.S. (Technician) under the supervision of Alphonzo Severance, Psy.D., ABN. Meredith [name] was able to tolerate all test procedures. Dr. Nicole Kindred met with the patient as needed to manage any emotional reactions to the testing procedures. Rest breaks were offered.    The battery of tests administered was selected by Dr. Nicole Kindred with consideration to the patient's current level of functioning, the nature of her symptoms, emotional and behavioral responses during the interview, level of literacy, observed level of motivation/effort, and the nature of the referral question. This battery was communicated to the psychometrist. Communication between Dr. Nicole Kindred and the psychometrist was ongoing throughout the evaluation and Dr. Nicole Kindred was immediately accessible at all times. Dr. Nicole Kindred provided supervision to the technician on the date of this service, to the extent necessary to assure the quality of all services provided.    Meredith [name] will return in approximately one week for an interactive feedback session with Dr. Nicole Kindred, at which time test performance, clinical impressions, and treatment recommendations will be reviewed in detail. The patient understands she can contact our office should she require our assistance before this time.   A total of 100 minutes of billable time were spent with Meredith [name] by the technician, including test administration and scoring time. Billing for these services is reflected in Dr. Les Pou note.   This note reflects time spent with the psychometrician and does not include test scores, clinical history, or any interpretations made by Dr. Nicole Kindred. The full report will follow in a separate note.
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
## 2020-03-22 NOTE — Progress Notes (Signed)
[facility]  Patient Name: Meredith [name] MRN: [id number] Date of Birth: 20-May-1950 Age: 70 y.o. Education: 14 years  Measurement properties of test scores: IQ, Index, and Standard Scores (SS): Mean = 100; Standard Deviation = 15 Scaled Scores (Ss): Mean = 10; Standard Deviation = 3 Z scores (Z): Mean = 0; Standard Deviation = 1 T scores (T); Mean = 50; Standard Deviation = 10  TEST SCORES:    Note: This summary of test scores accompanies the interpretive report and should not be interpreted by unqualified individuals or in isolation without reference to the report. Test scores are relative to age, gender, and educational history as available and appropriate.   Performance Validity        ACS: Raw Descriptor      Word Choice: 49 Within Expectation      The Dot Counting Test: Raw Descriptor      E-Score 12 Within Expectation      Embedded Measures: Raw Descriptor      RBANS Effort Index: 2 Within Expectation      WAIS-IV Reliable Digit Span 7 Within Expectation      WAIS-IV Reliable Digit Span Revised 11 Within Expectation      Expected Functioning        Wide Range Achievement Test (Word Reading): Standard/Scaled Score Percentile       Word Reading 107 68      Reynolds Intellectual Screening Test Standard/T-score Percentile      Guess What 45 31      Odd Item Out 38 12  RIST Index 88 21      Cognitive Testing        RBANS, Form : Standard/Scaled Score Percentile  Total Score 82 12  Immediate Memory 78 7      List Learning 3 1      Story Memory 9 37  Visuospatial/Constructional 100 50      Figure Copy   (20) 14 91      Judgment of Line Orientation   (12) --- 3-9  Language 96 39      Picture Naming --- 51-75      Semantic Fluency 8 25  Attention 82 12      Digit Span 7 16      Coding 7 16  Delayed Memory 75 5      List Recall   (0) --- <2      List Recognition   (15) --- <2      Story Recall   (11) 13 84       Figure Recall   (14) 11 63      Wechsler Adult Intelligence Scale - IV: Standard/Scaled Score Percentile  Working Memory Index 83 13      Digit Span 8 25          Digit Span Forward 9 37          Digit Span Backward 6 9          Digit Span Sequencing 10 50      Arithmetic 6 9  Processing Speed Index 89 23      Symbol Search 7 16      Coding 9 37      Neuropsychological Assessment Battery (Language Module): T-score Percentile      Naming   (30) 56 73      Verbal Fluency: T-score Percentile      Controlled Oral Word Association (F-A-S) 30 2  Semantic Fluency (Animals) 33 5      Trail Making Test: T-Score Percentile      Part A 42 21      Part B 44 27      Modified Wisconsin Card Sorting Test (MWCST): Standard/T-Score Percentile      Number of Categories Correct 27 1      Number of Perseverative Errors 37 9      Number of Total Errors 37 9      Percent Perseverative Errors 48 42  Executive Function Composite 70 2      Boston Diagnostic Aphasia Exam: Raw Score Scaled Score      Complex Ideational Material 12 12      Clock Drawing Raw Score Descriptor      Command 8 Borderline Impairment      Rating Scales         Raw Score Descriptor  Patient Health Questionnaire - 9 14 Moderate  GAD-7 10 Moderate   Peter V. Nicole Kindred PsyD, [place] Clinical Neuropsychologist

## 2020-03-23 DIAGNOSIS — K5730 Diverticulosis of large intestine without perforation or abscess without bleeding: Secondary | ICD-10-CM | POA: Diagnosis not present

## 2020-03-23 DIAGNOSIS — Z1211 Encounter for screening for malignant neoplasm of colon: Secondary | ICD-10-CM | POA: Diagnosis not present

## 2020-03-23 DIAGNOSIS — K648 Other hemorrhoids: Secondary | ICD-10-CM | POA: Diagnosis not present

## 2020-03-23 NOTE — Progress Notes (Signed)
[facility]  Patient Name: Meredith [name] MRN: [id number] Date of Birth: 05/04/1950 Age: 70 y.o. Education: 11 years  Clinical Impressions and Recommendations  Meredith [name] is a 70 y.o., right-hand dominant, divorced woman with a longstanding history of mental illness, afib s/p ablation x2, hypothyroidism, and chronic migraine. She was in her normal state of health cognitively until a series of presentations for AMS (09/29/20219, 10/22/2018, and 44/02/270) for metabolic encephalopathy in the setting of various precipitating factors. There was also thought of a cerebellar stroke on an 10/22/2018 head CT but that was found to be artifactual on follow up MRI. She has appreciated difficulties with maintaining her train of thought since then, in addition to forgetfulness and some cognitive slowing. Her difficulties are not constant, they come and go, and she is generally independent in all day-to-day activities. Her most recent MRI is from 07/15/2019, and shows mild central predominate volume loss and no significant leukoaraiosis.   Neuropsychological testing shows objective evidence of problems that can be summed up as mainly frontal-subcortical in nature. She had scattered low scores on primary measures of executive abilities, indicators of associative verbal fluency with an executive component, and on select measures of verbal working memory (also a facet of executive control). The extent of these difficulties appears to be mild. With respect to memory specifically, she had difficulties learning and retaining unstructured verbal information with better performance for structured information, suggesting that her executive control problems may have attenuated her memory functioning. There is no suggestion of memory storage problems and she also did well on visual object confrontation naming. She reported moderate levels of depression and anxiety symptoms.    Meredith [name] is thus manifesting a mild neurocognitive disorder that is likely multifactorial and may be related to aging in the setting of chronic mental illness, persistent cognitive impairment post-delirium, medication side effects, and distraction related to pain from ongoing migraine headaches. She may have had some level of MCI for some time, predisposing her to episodes of encephalopathy. Her test data are not concerning for an incipient degenerative condition (e.g., Alzheimer's) at this time. Would recommend that she continue in her treatments with Dr. Cheryln Manly and Dr. Clovis Pu because her depression and anxiety are also likely playing a role. She may also benefit from increasing her activity level, including mentally stimulating activities such as social interactions and reading. There is no need for further follow up evaluation at this time although she is free to return for further evaluation no sooner than 1 year as needed. I will counsel her regarding behavioral activation, brain health maintenance via diet and exercise, and other healthy lifestyle changes.  Diagnostic Impressions: Mild neurocognitive disorder due to multiple etiologies Bipolar 1 disorder (by history)  Test Findings  Test scores are summarized in additional documentation associated with this encounter. Test scores are relative to age, gender, and educational history as available and appropriate. There were no concerns about performance validity as all findings fell within normal expectations.   General Intellectual Functioning/Achievement:  Performance on single word reading was toward the upper portion of the high average range whereas her RIST index was low average. She performed at an average level on the verbally mediated subtest and at a low average level on the more verbally oriented subtest. This may suggest better developed verbal as compared to visual intellectual abilities.   Attention and Processing  Efficiency: Performance on indicators of attention and working memory fell at a reasonable low average level overall, albeit  with some scattered low scores on select working memory tasks. Digit repetition forward was average and digit repetition backward was unusually low. Digit resequencing in ascending order was average. Performance was unusually low on mental solving of arithmetical word problems.   With respect to processing speed, she demonstrated reasonable low average almost average range performance on the Processing Speed Index of the WAIS-IV. Average to low average scores were generated on timed measures of number-symbol coding and efficient visual scanning/efficient visual matching.   Language: Language findings showed intact performance on the fundamental ability of visual object confrontation naming. By contrast, she demonstrated unusually low scores on measures of verbal fluency, which may be on the basis of executive task demands.   Visuospatial Function: Performance on visuospatial and constructional measures was average with very good errorless figure copy and then unusually low judgment of angular line orientation. I am uncertain what to make of the low score on the latter measure, as intact construction implies intact perception. This may relate to subtle premorbid differences in verbal vs. Visual abilities in the context of her RIST profile.    Learning and Memory: Performance on measures of learning and memory showed scattered low scores concerning for executive attenuation including better learning and recall for structured as opposed to unstructured verbal information.  In the verbal realm, she demonstrated unusually low performance on immediate recall with extremely low word list learning and low average story memory. Delayed recall was similarly extremely low for the word list with no benefit to recognition cuing. By contrast, her delayed recall of a short story was high  average.   In the visual realm, delayed recall was average.   Executive Functions: Performance on executive measures was variable, with an unusually low score on the Executive Function Composite of the [employer]. Her categories correct score was extremely low while her perseverative errors score was weak, falling at a low average (almost unusually low level). Generation of words in response to given letters was unusually low. By contrast, she did well when reasoning with verbal information, which was errorless. She also performed at an average level on alternating sequencing of numbers and letters of the alphabet. Clock drawing was consistent with "borderline impairment" with numbers outside the clock face.   Rating Scale(s): Ms. Reliford screened in the moderate range for symptoms of depression and anxiety. She endorsed subjective symptoms of depression including little interest or pleasure in doing things more than half the days over the past two weeks in addition to feeling down, depressed, or hopeless several days over the past two weeks. Her anxiety symptoms included felling nervous, anxious, or on edge nearly every day of over the past two weeks in addition to trouble relaxing and becoming easily annoyed or irritiable more than half the days over the past two weeks.   Viviano Simas Nicole Kindred PsyD, [place] Clinical Neuropsychologist

## 2020-03-28 ENCOUNTER — Ambulatory Visit (INDEPENDENT_AMBULATORY_CARE_PROVIDER_SITE_OTHER): Payer: Medicare Other | Admitting: Counselor

## 2020-03-28 ENCOUNTER — Other Ambulatory Visit: Payer: Self-pay

## 2020-03-28 ENCOUNTER — Encounter: Payer: Self-pay | Admitting: Counselor

## 2020-03-28 DIAGNOSIS — F319 Bipolar disorder, unspecified: Secondary | ICD-10-CM | POA: Diagnosis not present

## 2020-03-28 DIAGNOSIS — G3184 Mild cognitive impairment, so stated: Secondary | ICD-10-CM | POA: Diagnosis not present

## 2020-03-28 DIAGNOSIS — F0670 Mild neurocognitive disorder due to known physiological condition without behavioral disturbance: Secondary | ICD-10-CM

## 2020-03-28 NOTE — Progress Notes (Signed)
NEUROPSYCHOLOGY FEEDBACK NOTE [facility]  Feedback Note: I met with Meredith [name] to review the findings resulting from her neuropsychological evaluation. Since the last appointment, she has been about the same.Time was spent reviewing the impressions and recommendations that are detailed in the evaluation report. We discussed impression of mild neurocognitive disorder, likely due to multiple etiologies. I counseled her regarding post-acute delirium persisting cognitive problems, which happens in her case in the setting of aging in chronic mental illness and multiple medications. Encouraged her to prioritize behavioral activation and social activities to the extent possible given the pandemic. Other interventions and topics of discussion as reflected in the patient instructions. I took time to explain the findings and answer all the patient's questions. I encouraged Ms. Lucado to contact me should she have any further questions or if further follow up is desired.   Current Medications and Medical History   Current Outpatient Medications  Medication Sig Dispense Refill  . ARIPiprazole (ABILIFY) 30 MG tablet Take 1 tablet (30 mg total) by mouth daily. 30 tablet 5  . Ascorbic Acid (VITAMIN C) 1000 MG tablet Take 1,000 mg by mouth daily.    [name] aspirin 325 MG tablet Take 325 mg by mouth daily as needed for headache.     . baclofen (LIORESAL) 10 MG tablet Take 1 tablet by mouth as needed.    . Cholecalciferol (VITAMIN D3) 125 MCG (5000 UT) TABS Take 1 tablet by mouth daily at 12 noon.    . clotrimazole (LOTRIMIN) 1 % cream APPLY CREAM TOPICALLY TO AFFECTED AREA TWICE DAILY    . docusate sodium (COLACE) 100 MG capsule 1 capsule as needed    . EPINEPHrine 0.3 mg/0.3 mL IJ SOAJ injection SMARTSIG:1 Pre-Filled Pen Syringe IM PRN    . Erenumab-aooe (AIMOVIG) 140 MG/ML SOAJ Inject 140 mg into the skin every 30 (thirty) days.    [name] estradiol (ESTRACE) 1 MG tablet Take 1 mg by mouth daily.     . ferrous sulfate 325 (65 FE) MG tablet Take 325 mg by mouth 2 (two) times daily.    . fluticasone (FLONASE SENSIMIST) 27.5 MCG/SPRAY nasal spray Place 1 spray into the nose daily.    [name] HYDROcodone-acetaminophen (NORCO) 7.5-325 MG tablet Take 1 tablet by mouth every 8 (eight) hours as needed for moderate pain.    [name] ipratropium (ATROVENT) 0.03 % nasal spray 1-2 sprays in each nostril    . lamoTRIgine (LAMICTAL) 100 MG tablet Take 1 tablet (100 mg total) by mouth 2 (two) times daily. 60 tablet 5  . levocetirizine (XYZAL) 5 MG tablet Take 5 mg by mouth every evening.    [name] levothyroxine (SYNTHROID) 112 MCG tablet Take 112 mcg by mouth daily before breakfast.    . lithium carbonate 150 MG capsule Take 1 capsule (150 mg total) by mouth daily. 90 capsule 0  . lithium carbonate 300 MG capsule Take 1 capsule by mouth at bedtime 90 capsule 0  . loperamide (IMODIUM) 2 MG capsule Take 1 capsule (2 mg total) by mouth as needed for diarrhea or loose stools. 30 capsule 0  . Melatonin 5 MG TABS Take 15 mg by mouth at bedtime.    . metFORMIN (GLUCOPHAGE) 500 MG tablet TAKE 1 TABLET BY MOUTH TWICE DAILY WITH A MEAL    . metoprolol succinate (TOPROL-XL) 50 MG 24 hr tablet Take 1 tablet (50 mg total) by mouth daily. Take with or immediately following a meal. 90 tablet 3  . naloxone (NARCAN) 0.4 MG/ML  injection     . ondansetron (ZOFRAN) 4 MG tablet     . ondansetron (ZOFRAN) 4 MG tablet Take 1 tablet (4 mg total) by mouth every 8 (eight) hours as needed for nausea or vomiting. 20 tablet 0  . pantoprazole (PROTONIX) 40 MG tablet Take 1 tablet (40 mg total) by mouth daily. 90 tablet 0  . pravastatin (PRAVACHOL) 20 MG tablet Take 20 mg by mouth daily.  3  . pregabalin (LYRICA) 150 MG capsule Take 150 mg by mouth 3 (three) times daily.    . QUEtiapine (SEROQUEL) 200 MG tablet Take 1 tablet (200 mg total) by mouth at bedtime. 30 tablet 3  . senna (SENOKOT) 8.6 MG TABS tablet Take 2 capsules by mouth every other day.     . senna-docusate (PERI-COLACE) 8.6-50 MG tablet Take 2 tablets by mouth 2 (two) times daily. For constipation    . verapamil (VERELAN PM) 120 MG 24 hr capsule Take 1 capsule (120 mg total) by mouth at bedtime. For high blood pressure    . warfarin (COUMADIN) 2.5 MG tablet TAKE AS DIRECTED BY COUMADIN CLINIC 160 tablet 0   No current facility-administered medications for this visit.   Patient Active Problem List   Diagnosis Date Noted  . Allergic rhinitis 03/11/2020  . Vasomotor rhinitis 03/11/2020  . C. difficile diarrhea 07/15/2019  . Chronic kidney disease, stage III (moderate) ([place]) 07/15/2019  . Leukocytosis 07/15/2019  . Diabetes mellitus type 2, controlled ([place]) 07/15/2019  . Lithium toxicity 10/27/2018  . Acute encephalopathy   . Acute ischemic stroke ([place]) 10/22/2018  . Renal insufficiency 10/22/2018  . Pain and swelling of right lower leg 10/22/2018  . Hypothyroidism 02/24/2018  . Encounter for therapeutic drug monitoring 01/03/2018  . PTSD (post-traumatic stress disorder) 11/29/2017  . Falls 11/25/2017  . Disorientation   . Delirium 11/24/2017  . Stiffness of finger joint of right hand 06/18/2017  . Osteoarthritis of carpometacarpal (CMC) joint of thumb 05/21/2017  . Anxiety 11/29/2016  . Chronic pain syndrome 07/30/2016  . Bipolar disorder, curr episode mixed, severe, with psychotic features ([place]) 07/28/2016  . PAF (paroxysmal atrial fibrillation) ([place]) 11/14/2015  . Intractable chronic migraine without aura 04/24/2012    Mental Status and Behavioral Observations  Earle Burson presented on time to the present encounter and was alert and generally oriented. Speech was normal in rate, rhythm, volume, and prosody. Self-reported mood was "about the same" and affect was neutral. Thought process was logical and goal oriented and thought content was appropriate. She denied any thoughts of harming herself or others on direct questioning.   Plan  Feedback provided  regarding the patient's neuropsychological evaluation. She does have a mild level of cognitive difficulties, although I do not think it represents prodromal neurodegeneration. She will follow up with Dr. Cheryln Manly and Dr. Clovis Pu, who have already been sent reports. Lenor Provencher was encouraged to contact me if any questions arise or if further follow up is desired.   Viviano Simas Nicole Kindred, PsyD, [employer] Clinical Neuropsychologist  Service(s) Provided at This Encounter: 5 minutes [phone]; Psychotherapy with patient/family)

## 2020-03-28 NOTE — Patient Instructions (Signed)
At your visit today, we discussed your presentation and performance, which were consistent with what I would characterize as some mild cognitive difficulties mainly of an executive control type variety. These impacted your memory test scores. I think the best diagnosis considering this data in the context of your clinical history is Mild Neurocognitive Disorder.   Mild neurocognitive disorder is a term for memory and thinking problems that are significant enough to be picked up on neuropsychological testing but do not sufficiently undermine your capacity as to interfere with your independence. Sometimes, this can be a risk factor for dementia, such as when mild neurocognitive disorder is due to Alzheimer's disease. In your case, fortunately, I do not think that is the case. I think your mild neurocognitive disorder is due to post-acute delirium/cognitive impairment related to your episodes of altered mental status, aging in the setting of mental illness, and disruptive factors such as mental illness symptoms and pain related to arthritis, migraine headaches and the like.   There is a significant research base and evidence of effectiveness for something called "behavioral activation," which is a fancy way of saying that you should increase your activity level. In general, people do not feel as happy or do as well when they are not doing much. This can include things like getting out for walks, re-engaging in hobbies, spending time with family or friends, or learning a new hobby. It's not so important what you do as that you enjoy it and stick with it. Depression can start a vicious cycle where you are not doing a lot because you don't feel well, which leads to less things to be excited and happy about, and thus more depression and behavioral avoidance. It can be hard to change this pattern once it has started but most people find that they feel better when they start doing more even if they don't enjoy it at  first.   The following compensatory strategies may be helpful for managing day-to-day memory symptoms: . Minimize distractions and interruptions to the extent possible. Be an active observer, present-minded, and focus attention. Focus on only one task for a period of time.  . Get organized. Establish routines and stick to them. Make and use checklists.  . Use external memory aids as needed, such as a planner and notebook. Repetition, written reminders, and keeping a calendar of appointments may be helpful. Marlene Lard a place to keep your keys, wallet, cell phone, and other personal belongings.  . Break down tasks into smaller steps to help get started and to keep from feeling overwhelmed.  Increase your success learning information by breaking it into manageable chunks, connecting it to previously learned information, or forming associations with what you are trying to remember.   We discussed your neuroimaging and reviewed it. There is a bit of central volume loss but it is nonspecific and not very pronounced and there is no focal volume loss, which is positive. I was not able to see that there was a stroke and think that this was in fact an artifact on a CT scan that was later disproven with MRI.   Of course, continue on in your treatments with Dr. Clovis Pu and Dr. Cheryln Manly, controlling mental health symptoms is an intervention of maximal impact because it may address your problems directly and also help you to have better health behaviors.   We also discussed the importance of physical activity and I encouraged you to follow up with primary care, Dr. Dayna Ramus, to see if  you should be restricting your activities related to arthritis. Paradoxically, sometimes movement can be helpful in certain types of arthritis but you would need to talk with a medical provider about that.

## 2020-03-29 ENCOUNTER — Ambulatory Visit
Admission: RE | Admit: 2020-03-29 | Discharge: 2020-03-29 | Disposition: A | Discharge status: Home / Self Care | Payer: Medicare Other | Source: Ambulatory Visit | Attending: Podiatry | Admitting: Podiatry

## 2020-03-29 DIAGNOSIS — R600 Localized edema: Secondary | ICD-10-CM | POA: Diagnosis not present

## 2020-03-29 DIAGNOSIS — R200 Anesthesia of skin: Secondary | ICD-10-CM | POA: Diagnosis not present

## 2020-03-29 DIAGNOSIS — M8580 Other specified disorders of bone density and structure, unspecified site: Secondary | ICD-10-CM

## 2020-03-29 DIAGNOSIS — M7989 Other specified soft tissue disorders: Secondary | ICD-10-CM | POA: Diagnosis not present

## 2020-03-29 DIAGNOSIS — M19071 Primary osteoarthritis, right ankle and foot: Secondary | ICD-10-CM | POA: Diagnosis not present

## 2020-03-29 DIAGNOSIS — M2021 Hallux rigidus, right foot: Secondary | ICD-10-CM

## 2020-03-29 DIAGNOSIS — M2031 Hallux varus (acquired), right foot: Secondary | ICD-10-CM

## 2020-03-30 ENCOUNTER — Ambulatory Visit (INDEPENDENT_AMBULATORY_CARE_PROVIDER_SITE_OTHER): Payer: Medicare Other | Admitting: Psychology

## 2020-03-30 DIAGNOSIS — F3181 Bipolar II disorder: Secondary | ICD-10-CM

## 2020-03-31 ENCOUNTER — Ambulatory Visit (INDEPENDENT_AMBULATORY_CARE_PROVIDER_SITE_OTHER): Payer: Medicare Other | Admitting: Psychiatry

## 2020-03-31 ENCOUNTER — Other Ambulatory Visit: Payer: Self-pay

## 2020-03-31 ENCOUNTER — Encounter: Payer: Self-pay | Admitting: Psychiatry

## 2020-03-31 DIAGNOSIS — F4001 Agoraphobia with panic disorder: Secondary | ICD-10-CM

## 2020-03-31 DIAGNOSIS — F411 Generalized anxiety disorder: Secondary | ICD-10-CM

## 2020-03-31 DIAGNOSIS — F4310 Post-traumatic stress disorder, unspecified: Secondary | ICD-10-CM | POA: Diagnosis not present

## 2020-03-31 DIAGNOSIS — F5105 Insomnia due to other mental disorder: Secondary | ICD-10-CM

## 2020-03-31 DIAGNOSIS — G3184 Mild cognitive impairment, so stated: Secondary | ICD-10-CM | POA: Diagnosis not present

## 2020-03-31 DIAGNOSIS — G251 Drug-induced tremor: Secondary | ICD-10-CM

## 2020-03-31 DIAGNOSIS — F3132 Bipolar disorder, current episode depressed, moderate: Secondary | ICD-10-CM

## 2020-03-31 DIAGNOSIS — Z79899 Other long term (current) drug therapy: Secondary | ICD-10-CM | POA: Diagnosis not present

## 2020-03-31 DIAGNOSIS — F3181 Bipolar II disorder: Secondary | ICD-10-CM

## 2020-03-31 MED ORDER — LITHIUM CARBONATE 300 MG PO CAPS: 300.0000 mg | ORAL_CAPSULE | Freq: Every day | ORAL | 1 refills | Status: DC

## 2020-03-31 MED ORDER — ARIPIPRAZOLE 30 MG PO TABS
30.0000 mg | ORAL_TABLET | Freq: Every day | ORAL | 5 refills | Status: DC
Start: 2020-03-31 — End: 2020-08-23

## 2020-03-31 NOTE — Progress Notes (Signed)
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 urgent medical attention if experiencing these signs and symptoms.  Patient advised to contact office with any questions or concerns.  continue quetiapine to 200 mg at bedtime  Meredith [name] might have unrealistic expectations to sleep 9 hours.  Disc tolerance.  continue Abilify (aripiprazole) to 30 mg each morning for bipolar depression and mood stability for longer trial.  So far hard to judge it's effect. Disc the long half life  Continue Lamotrigine 100 BID  Push fluids.  Has to remind herself..    Discussed potential metabolic side effects associated with atypical antipsychotics, as well as potential risk for movement side effects. Advised pt to contact office if movement side effects occur.   Consider increasing lithium but Meredith [name]'s prone to toxicity. May 20, 2019 lithium level 0.9 on 450 mg daily and creatinine was 1.1 with normal calcium 07/21/19 lithium 0.4 on 450 mg daily, normal CMP 10/01/19 lithium level 0.8 at [facility] scanned in 12/01/19 Lithium level 1.0 on 450 mg every afternoon. Creatinine 1.1 and calcium 9.8.  DT tremor: Continue lithium to 300 mg only on Sunday, Tuesday, Thursday, and Saturday On Monday, Wednesday, Friday take 300 mg AND 150 mg capsules of lithium.  Hesitate to go any lower in lithium. Encourage exercise of upper body.  For intermittent tremor and jerks avoid propranolol DT DDI.    OK Ativan prn for dental procedure 1-2 mg.  No driving after  it.  Meredith [name] stopped diphenhydramine successfully after seeing Dr. Harold Hedge 07/07/19.  PCP Elyn Peers, Genelle Bal  Continue therapy with Dr. Cheryln Manly q 2 weeks.  Bring meds to office.  Follow-up 8 weeks   Lynder Parents MD, DFAPA  Please see After Visit Summary for patient specific instructions.  Future Appointments  Date Time Provider [place]  04/04/2020  2:15 PM [facility]  04/08/2020  2:15 PM Evelina Bucy, DPM [facility]  04/13/2020  1:00 PM Oren Binet, PhD [facility] None  04/27/2020  1:00 PM Oren Binet, PhD [facility] None  05/11/2020  1:00 PM Oren Binet, PhD [facility] None    No orders of the defined  types were placed in this encounter.     -------------------------------

## 2020-04-01 ENCOUNTER — Telehealth: Payer: Self-pay | Admitting: Cardiovascular Disease

## 2020-04-01 NOTE — Telephone Encounter (Signed)
Patient c/o Palpitations:  High priority if patient c/o lightheadedness, shortness of breath, or chest pain  1) How long have you had palpitations/irregular HR/ Afib? Are you having the symptoms now? About a week. Patient went into afib right before her colonoscopy  2) Are you currently experiencing lightheadedness, SOB or CP? no  3) Do you have a history of afib (atrial fibrillation) or irregular heart rhythm? yes  4) Have you checked your BP or HR? (document readings if available): no  5) Are you experiencing any other symptoms? No  Patient was just calling to let Dr. Acie Fredrickson know that the patient went into afib before her recent colonoscopy. The patient almost did not have the procedure done but the GI Doctor decided to proceed Patient has a copy of the EKG that was done during the colonoscopy and wanted to know if she could drop those off for Dr. Acie Fredrickson to interpret. Please advise .

## 2020-04-01 NOTE — Telephone Encounter (Signed)
Pt reports that she has been in & out of AFib since colonscopy on 1/26. She is wondering if she needs a follow up EKG or have Dr. Acie Fredrickson review the EKGs.   Denies any symptoms, denies CP, SOB, dizziness/lightheadness, syncope. She only reports racing heart when goes into AFib. Happening on and off daily, last for a few minutes and then back to NSR. Reviewed with Dr. Acie Fredrickson. Pt advised to continue to monitor for now.  Advised to call back if symptoms begin and/or she stays out of rhythm for more than 24-48 hours.   Patient verbalized understanding and agreeable to plan.

## 2020-04-04 ENCOUNTER — Ambulatory Visit (INDEPENDENT_AMBULATORY_CARE_PROVIDER_SITE_OTHER): Payer: Medicare Other

## 2020-04-04 ENCOUNTER — Other Ambulatory Visit: Payer: Self-pay

## 2020-04-04 DIAGNOSIS — Z5181 Encounter for therapeutic drug level monitoring: Secondary | ICD-10-CM | POA: Diagnosis not present

## 2020-04-04 DIAGNOSIS — I480 Paroxysmal atrial fibrillation: Secondary | ICD-10-CM | POA: Diagnosis not present

## 2020-04-04 LAB — POCT INR: INR: 2 (ref 2.0–3.0)

## 2020-04-04 NOTE — Patient Instructions (Signed)
-   Continue taking same dosage of Warfarin 1.5 tablets daily except for 2 tablets on Mondays.  - Recheck INR in 6 weeks.  Call with any medication changes. [facility] [phone] Main [phone]

## 2020-04-05 DIAGNOSIS — M791 Myalgia, unspecified site: Secondary | ICD-10-CM | POA: Diagnosis not present

## 2020-04-05 DIAGNOSIS — G518 Other disorders of facial nerve: Secondary | ICD-10-CM | POA: Diagnosis not present

## 2020-04-05 DIAGNOSIS — G43719 Chronic migraine without aura, intractable, without status migrainosus: Secondary | ICD-10-CM | POA: Diagnosis not present

## 2020-04-05 DIAGNOSIS — M542 Cervicalgia: Secondary | ICD-10-CM | POA: Diagnosis not present

## 2020-04-08 ENCOUNTER — Ambulatory Visit: Payer: Self-pay | Admitting: Podiatry

## 2020-04-08 ENCOUNTER — Other Ambulatory Visit: Payer: Self-pay

## 2020-04-08 ENCOUNTER — Ambulatory Visit (INDEPENDENT_AMBULATORY_CARE_PROVIDER_SITE_OTHER): Payer: Medicare Other | Admitting: Podiatry

## 2020-04-08 DIAGNOSIS — G5601 Carpal tunnel syndrome, right upper limb: Secondary | ICD-10-CM | POA: Insufficient documentation

## 2020-04-08 DIAGNOSIS — K5901 Slow transit constipation: Secondary | ICD-10-CM | POA: Insufficient documentation

## 2020-04-08 DIAGNOSIS — M2030 Hallux varus (acquired), unspecified foot: Secondary | ICD-10-CM | POA: Insufficient documentation

## 2020-04-08 DIAGNOSIS — M19079 Primary osteoarthritis, unspecified ankle and foot: Secondary | ICD-10-CM | POA: Diagnosis not present

## 2020-04-08 DIAGNOSIS — M8580 Other specified disorders of bone density and structure, unspecified site: Secondary | ICD-10-CM | POA: Insufficient documentation

## 2020-04-08 DIAGNOSIS — N390 Urinary tract infection, site not specified: Secondary | ICD-10-CM | POA: Insufficient documentation

## 2020-04-08 DIAGNOSIS — N959 Unspecified menopausal and perimenopausal disorder: Secondary | ICD-10-CM | POA: Insufficient documentation

## 2020-04-08 DIAGNOSIS — K589 Irritable bowel syndrome without diarrhea: Secondary | ICD-10-CM | POA: Insufficient documentation

## 2020-04-08 DIAGNOSIS — J301 Allergic rhinitis due to pollen: Secondary | ICD-10-CM | POA: Insufficient documentation

## 2020-04-08 DIAGNOSIS — E6609 Other obesity due to excess calories: Secondary | ICD-10-CM | POA: Insufficient documentation

## 2020-04-08 DIAGNOSIS — F3181 Bipolar II disorder: Secondary | ICD-10-CM | POA: Insufficient documentation

## 2020-04-08 DIAGNOSIS — R2681 Unsteadiness on feet: Secondary | ICD-10-CM | POA: Insufficient documentation

## 2020-04-08 DIAGNOSIS — M2031 Hallux varus (acquired), right foot: Secondary | ICD-10-CM

## 2020-04-08 DIAGNOSIS — Z6836 Body mass index (BMI) 36.0-36.9, adult: Secondary | ICD-10-CM | POA: Insufficient documentation

## 2020-04-08 DIAGNOSIS — N183 Chronic kidney disease, stage 3 unspecified: Secondary | ICD-10-CM

## 2020-04-08 DIAGNOSIS — R4701 Aphasia: Secondary | ICD-10-CM | POA: Insufficient documentation

## 2020-04-08 DIAGNOSIS — E782 Mixed hyperlipidemia: Secondary | ICD-10-CM | POA: Insufficient documentation

## 2020-04-08 DIAGNOSIS — D6869 Other thrombophilia: Secondary | ICD-10-CM | POA: Insufficient documentation

## 2020-04-08 DIAGNOSIS — E7800 Pure hypercholesterolemia, unspecified: Secondary | ICD-10-CM | POA: Insufficient documentation

## 2020-04-08 DIAGNOSIS — F5101 Primary insomnia: Secondary | ICD-10-CM | POA: Insufficient documentation

## 2020-04-08 DIAGNOSIS — E559 Vitamin D deficiency, unspecified: Secondary | ICD-10-CM | POA: Insufficient documentation

## 2020-04-08 DIAGNOSIS — D649 Anemia, unspecified: Secondary | ICD-10-CM | POA: Insufficient documentation

## 2020-04-08 DIAGNOSIS — R296 Repeated falls: Secondary | ICD-10-CM | POA: Insufficient documentation

## 2020-04-08 DIAGNOSIS — G43009 Migraine without aura, not intractable, without status migrainosus: Secondary | ICD-10-CM | POA: Insufficient documentation

## 2020-04-08 DIAGNOSIS — I480 Paroxysmal atrial fibrillation: Secondary | ICD-10-CM | POA: Diagnosis not present

## 2020-04-08 DIAGNOSIS — Z9889 Other specified postprocedural states: Secondary | ICD-10-CM | POA: Insufficient documentation

## 2020-04-08 DIAGNOSIS — M2021 Hallux rigidus, right foot: Secondary | ICD-10-CM

## 2020-04-08 DIAGNOSIS — Z7901 Long term (current) use of anticoagulants: Secondary | ICD-10-CM | POA: Insufficient documentation

## 2020-04-08 DIAGNOSIS — M2041 Other hammer toe(s) (acquired), right foot: Secondary | ICD-10-CM

## 2020-04-08 DIAGNOSIS — K5900 Constipation, unspecified: Secondary | ICD-10-CM | POA: Insufficient documentation

## 2020-04-08 DIAGNOSIS — K219 Gastro-esophageal reflux disease without esophagitis: Secondary | ICD-10-CM | POA: Insufficient documentation

## 2020-04-08 NOTE — Progress Notes (Signed)
  Subjective:  Patient ID: Meredith [name], female    DOB: [birth year],  MRN: [id number]  No chief complaint on file.   DOS: 11/11/19 Procedure: Mateo Flow with implant right, hallux IPJ arthroplasty   70 y.o. female presents with the above complaint. History confirmed with patient.  History above confirmed with patient still having issues especially with recently the first toe started to bend towards the second toe.  Objective:  Physical Exam: mild tenderness at the surgical site, local edema noted and calf supple, nontender.  Hallux abductus deformity without warmth erythema or signs of infection Incision: well healed. No images are attached to the encounter.  MRI 03/29/20  IMPRESSION: 1. Irregular erosive changes associated with the interphalangeal joint of the right great toe most prominently involving the base of the great toe distal phalanx. Relatively mild marrow edema within the distal phalanx of the great toe given the degree of bony destruction. There is also a relative lack of inflammatory changes within the adjacent soft tissues. Findings are suggestive of an inflammatory or crystalline arthropathy, however changes related to infection with osteomyelitis are not excluded. 2. Postsurgical changes from first MTP joint arthroplasty without appreciable complication. 3. Serpiginous T2 hyperintense signal within the proximal to mid first metatarsal diaphysis, which has appearance more indicative of bone infarct rather than marrow edema related to stress change or loosening. 4. Numerous tiny foci of susceptibility within the soft tissues at the level of the IP joint which likely reflect micrometallic foci related to recent surgery. Soft tissue gas not excluded. Correlate for signs of infection.  Assessment:   1. Hallux malleus of right foot   2. Hallux rigidus of right foot   3. Bone erosion   4. Arthritis of big toe   5. Stage 3 chronic kidney disease,  unspecified whether stage 3a or 3b CKD ([place])   6. PAF (paroxysmal atrial fibrillation) ([place])     Plan:  Patient was evaluated and treated and all questions answered.  Post-operative State -MRI reviewed today - reassuring for absence of ostoemyelitis, possible inflammatory arthropathy including gout as cause of erosion. -Patient has failed all conservative therapy and wishes to proceed with surgical intervention. All risks, benefits, and alternatives discussed with patient. No guarantees given. Consent reviewed and signed by patient. -Planned procedures: Right 1st toe interphalangeal joint arthroplasty, 2nd toe hammertoe correction, possible 2nd metatarsal shortening osteotomy, possible bone biopsy of 1st toe -ASA 3 - Patient with moderate systemic disease with functional limitations; Risk factors: CKD 3, PAF -Post-op anticoagulation: will resume her Coumadin. PCP clearance  -DME dispensed for post-op use: [facility]   No follow-ups on file.

## 2020-04-12 ENCOUNTER — Other Ambulatory Visit: Payer: Self-pay

## 2020-04-12 DIAGNOSIS — M47816 Spondylosis without myelopathy or radiculopathy, lumbar region: Secondary | ICD-10-CM | POA: Diagnosis not present

## 2020-04-12 DIAGNOSIS — Z981 Arthrodesis status: Secondary | ICD-10-CM | POA: Diagnosis not present

## 2020-04-12 DIAGNOSIS — G894 Chronic pain syndrome: Secondary | ICD-10-CM | POA: Diagnosis not present

## 2020-04-12 DIAGNOSIS — Z79899 Other long term (current) drug therapy: Secondary | ICD-10-CM | POA: Diagnosis not present

## 2020-04-12 DIAGNOSIS — Z79891 Long term (current) use of opiate analgesic: Secondary | ICD-10-CM | POA: Diagnosis not present

## 2020-04-12 DIAGNOSIS — M961 Postlaminectomy syndrome, not elsewhere classified: Secondary | ICD-10-CM | POA: Diagnosis not present

## 2020-04-12 MED ORDER — WARFARIN SODIUM 2.5 MG PO TABS: ORAL_TABLET | ORAL | 1 refills | Status: DC

## 2020-04-13 ENCOUNTER — Ambulatory Visit (INDEPENDENT_AMBULATORY_CARE_PROVIDER_SITE_OTHER): Payer: Medicare Other | Admitting: Psychology

## 2020-04-13 DIAGNOSIS — F3181 Bipolar II disorder: Secondary | ICD-10-CM | POA: Diagnosis not present

## 2020-04-13 DIAGNOSIS — J3089 Other allergic rhinitis: Secondary | ICD-10-CM | POA: Diagnosis not present

## 2020-04-13 DIAGNOSIS — T63481A Toxic effect of venom of other arthropod, accidental (unintentional), initial encounter: Secondary | ICD-10-CM | POA: Diagnosis not present

## 2020-04-14 ENCOUNTER — Other Ambulatory Visit: Payer: Self-pay | Admitting: *Deleted

## 2020-04-14 MED ORDER — WARFARIN SODIUM 2.5 MG PO TABS: ORAL_TABLET | ORAL | 0 refills | Status: DC

## 2020-04-14 NOTE — Telephone Encounter (Signed)
Pt called and stated she need a refill on her Warfarin; advised that it was sent on 04/12/20 to [facility]; advised that we got a request from there and sent it in. She asked to remove [facility] and send to [facility] and make this her pharmacy. Removed [facility] and [facility] as she stated and added the [facility] and confirmed the address with her.

## 2020-04-15 DIAGNOSIS — M62571 Muscle wasting and atrophy, not elsewhere classified, right ankle and foot: Secondary | ICD-10-CM | POA: Diagnosis not present

## 2020-04-15 DIAGNOSIS — M25571 Pain in right ankle and joints of right foot: Secondary | ICD-10-CM | POA: Diagnosis not present

## 2020-04-15 DIAGNOSIS — R269 Unspecified abnormalities of gait and mobility: Secondary | ICD-10-CM | POA: Diagnosis not present

## 2020-04-15 DIAGNOSIS — M25674 Stiffness of right foot, not elsewhere classified: Secondary | ICD-10-CM | POA: Diagnosis not present

## 2020-04-18 DIAGNOSIS — M25571 Pain in right ankle and joints of right foot: Secondary | ICD-10-CM | POA: Diagnosis not present

## 2020-04-18 DIAGNOSIS — M25674 Stiffness of right foot, not elsewhere classified: Secondary | ICD-10-CM | POA: Diagnosis not present

## 2020-04-18 DIAGNOSIS — R269 Unspecified abnormalities of gait and mobility: Secondary | ICD-10-CM | POA: Diagnosis not present

## 2020-04-18 DIAGNOSIS — M62571 Muscle wasting and atrophy, not elsewhere classified, right ankle and foot: Secondary | ICD-10-CM | POA: Diagnosis not present

## 2020-04-20 DIAGNOSIS — M62571 Muscle wasting and atrophy, not elsewhere classified, right ankle and foot: Secondary | ICD-10-CM | POA: Diagnosis not present

## 2020-04-20 DIAGNOSIS — R269 Unspecified abnormalities of gait and mobility: Secondary | ICD-10-CM | POA: Diagnosis not present

## 2020-04-20 DIAGNOSIS — M25571 Pain in right ankle and joints of right foot: Secondary | ICD-10-CM | POA: Diagnosis not present

## 2020-04-20 DIAGNOSIS — M25674 Stiffness of right foot, not elsewhere classified: Secondary | ICD-10-CM | POA: Diagnosis not present

## 2020-04-22 DIAGNOSIS — M25571 Pain in right ankle and joints of right foot: Secondary | ICD-10-CM | POA: Diagnosis not present

## 2020-04-22 DIAGNOSIS — M25674 Stiffness of right foot, not elsewhere classified: Secondary | ICD-10-CM | POA: Diagnosis not present

## 2020-04-22 DIAGNOSIS — M62571 Muscle wasting and atrophy, not elsewhere classified, right ankle and foot: Secondary | ICD-10-CM | POA: Diagnosis not present

## 2020-04-22 DIAGNOSIS — R269 Unspecified abnormalities of gait and mobility: Secondary | ICD-10-CM | POA: Diagnosis not present

## 2020-04-26 DIAGNOSIS — M25571 Pain in right ankle and joints of right foot: Secondary | ICD-10-CM | POA: Diagnosis not present

## 2020-04-26 DIAGNOSIS — M62571 Muscle wasting and atrophy, not elsewhere classified, right ankle and foot: Secondary | ICD-10-CM | POA: Diagnosis not present

## 2020-04-26 DIAGNOSIS — R269 Unspecified abnormalities of gait and mobility: Secondary | ICD-10-CM | POA: Diagnosis not present

## 2020-04-26 DIAGNOSIS — M25674 Stiffness of right foot, not elsewhere classified: Secondary | ICD-10-CM | POA: Diagnosis not present

## 2020-04-27 ENCOUNTER — Ambulatory Visit (INDEPENDENT_AMBULATORY_CARE_PROVIDER_SITE_OTHER): Payer: Medicare Other | Admitting: Psychology

## 2020-04-27 DIAGNOSIS — F3181 Bipolar II disorder: Secondary | ICD-10-CM

## 2020-04-28 DIAGNOSIS — M25571 Pain in right ankle and joints of right foot: Secondary | ICD-10-CM | POA: Diagnosis not present

## 2020-04-28 DIAGNOSIS — M25674 Stiffness of right foot, not elsewhere classified: Secondary | ICD-10-CM | POA: Diagnosis not present

## 2020-04-28 DIAGNOSIS — M62571 Muscle wasting and atrophy, not elsewhere classified, right ankle and foot: Secondary | ICD-10-CM | POA: Diagnosis not present

## 2020-04-28 DIAGNOSIS — R269 Unspecified abnormalities of gait and mobility: Secondary | ICD-10-CM | POA: Diagnosis not present

## 2020-04-29 DIAGNOSIS — M62571 Muscle wasting and atrophy, not elsewhere classified, right ankle and foot: Secondary | ICD-10-CM | POA: Diagnosis not present

## 2020-04-29 DIAGNOSIS — M25571 Pain in right ankle and joints of right foot: Secondary | ICD-10-CM | POA: Diagnosis not present

## 2020-04-29 DIAGNOSIS — M25674 Stiffness of right foot, not elsewhere classified: Secondary | ICD-10-CM | POA: Diagnosis not present

## 2020-04-29 DIAGNOSIS — R269 Unspecified abnormalities of gait and mobility: Secondary | ICD-10-CM | POA: Diagnosis not present

## 2020-05-05 DIAGNOSIS — G43719 Chronic migraine without aura, intractable, without status migrainosus: Secondary | ICD-10-CM | POA: Diagnosis not present

## 2020-05-05 DIAGNOSIS — G518 Other disorders of facial nerve: Secondary | ICD-10-CM | POA: Diagnosis not present

## 2020-05-05 DIAGNOSIS — M542 Cervicalgia: Secondary | ICD-10-CM | POA: Diagnosis not present

## 2020-05-05 DIAGNOSIS — M791 Myalgia, unspecified site: Secondary | ICD-10-CM | POA: Diagnosis not present

## 2020-05-10 DIAGNOSIS — G894 Chronic pain syndrome: Secondary | ICD-10-CM | POA: Diagnosis not present

## 2020-05-10 DIAGNOSIS — M961 Postlaminectomy syndrome, not elsewhere classified: Secondary | ICD-10-CM | POA: Diagnosis not present

## 2020-05-10 DIAGNOSIS — T85192A Other mechanical complication of implanted electronic neurostimulator (electrode) of spinal cord, initial encounter: Secondary | ICD-10-CM | POA: Diagnosis not present

## 2020-05-10 DIAGNOSIS — M47816 Spondylosis without myelopathy or radiculopathy, lumbar region: Secondary | ICD-10-CM | POA: Diagnosis not present

## 2020-05-11 ENCOUNTER — Ambulatory Visit (INDEPENDENT_AMBULATORY_CARE_PROVIDER_SITE_OTHER): Payer: Medicare Other | Admitting: Psychology

## 2020-05-11 DIAGNOSIS — F3181 Bipolar II disorder: Secondary | ICD-10-CM

## 2020-05-16 ENCOUNTER — Other Ambulatory Visit: Payer: Self-pay

## 2020-05-16 ENCOUNTER — Ambulatory Visit (INDEPENDENT_AMBULATORY_CARE_PROVIDER_SITE_OTHER): Payer: Medicare Other

## 2020-05-16 DIAGNOSIS — Z5181 Encounter for therapeutic drug level monitoring: Secondary | ICD-10-CM | POA: Diagnosis not present

## 2020-05-16 DIAGNOSIS — I480 Paroxysmal atrial fibrillation: Secondary | ICD-10-CM

## 2020-05-16 LAB — POCT INR: INR: 3.4 — AB (ref 2.0–3.0)

## 2020-05-16 NOTE — Patient Instructions (Signed)
-   since you've already taken your warfarin today, please have a serving of greens tonight,  - take 1 tablet warfarin tomorrow, then - Continue taking same dosage of Warfarin 1.5 tablets daily except for 2 tablets on Mondays.  - Recheck INR in 6 weeks.  Call with any medication changes or if you are scheduled for surgery [facility] [phone] Main [phone]

## 2020-05-18 DIAGNOSIS — J300 Vasomotor rhinitis: Secondary | ICD-10-CM | POA: Diagnosis not present

## 2020-05-18 DIAGNOSIS — M7741 Metatarsalgia, right foot: Secondary | ICD-10-CM | POA: Diagnosis not present

## 2020-05-18 DIAGNOSIS — M2041 Other hammer toe(s) (acquired), right foot: Secondary | ICD-10-CM | POA: Diagnosis not present

## 2020-05-18 DIAGNOSIS — R040 Epistaxis: Secondary | ICD-10-CM | POA: Diagnosis not present

## 2020-05-18 DIAGNOSIS — G7249 Other inflammatory and immune myopathies, not elsewhere classified: Secondary | ICD-10-CM | POA: Diagnosis not present

## 2020-05-18 DIAGNOSIS — J3089 Other allergic rhinitis: Secondary | ICD-10-CM | POA: Diagnosis not present

## 2020-05-18 DIAGNOSIS — T63481D Toxic effect of venom of other arthropod, accidental (unintentional), subsequent encounter: Secondary | ICD-10-CM | POA: Diagnosis not present

## 2020-05-18 DIAGNOSIS — M2011 Hallux valgus (acquired), right foot: Secondary | ICD-10-CM | POA: Diagnosis not present

## 2020-05-24 DIAGNOSIS — F3181 Bipolar II disorder: Secondary | ICD-10-CM | POA: Diagnosis not present

## 2020-05-24 DIAGNOSIS — Z5181 Encounter for therapeutic drug level monitoring: Secondary | ICD-10-CM | POA: Diagnosis not present

## 2020-05-25 ENCOUNTER — Ambulatory Visit: Payer: Medicare Other | Admitting: Psychology

## 2020-05-25 NOTE — Progress Notes (Signed)
Li  0.7

## 2020-05-27 ENCOUNTER — Telehealth: Payer: Self-pay | Admitting: Urology

## 2020-05-27 DIAGNOSIS — Z23 Encounter for immunization: Secondary | ICD-10-CM | POA: Diagnosis not present

## 2020-05-27 NOTE — Telephone Encounter (Signed)
DOS - 06/22/20  BONE BIOPSY --- 20240 METATARSAL OSTEOTOMY 2ND RIGHT --- 51102 HAMMERTOE REPAIR 1ST AND 2ND RIGHT ---11173  NO PRIOR AUTH REQUIRED FOR CPT CODES 56701, 41030 AND 13143.

## 2020-05-30 ENCOUNTER — Telehealth: Payer: Self-pay

## 2020-05-30 NOTE — Telephone Encounter (Signed)
Meredith [name] called to cancel her surgery with Dr. March Rummage on 06/22/2020. I asked the reason for cancellation and she stated she didn't want to give a reason. Canceled the surgery with centralized scheduling and notified Dr. March Rummage

## 2020-05-31 ENCOUNTER — Encounter: Payer: Self-pay | Admitting: Psychiatry

## 2020-05-31 ENCOUNTER — Other Ambulatory Visit (HOSPITAL_COMMUNITY): Payer: Self-pay | Admitting: Orthopedic Surgery

## 2020-05-31 ENCOUNTER — Ambulatory Visit (INDEPENDENT_AMBULATORY_CARE_PROVIDER_SITE_OTHER): Payer: Medicare Other | Admitting: Psychiatry

## 2020-05-31 ENCOUNTER — Other Ambulatory Visit: Payer: Self-pay

## 2020-05-31 DIAGNOSIS — Z79899 Other long term (current) drug therapy: Secondary | ICD-10-CM | POA: Diagnosis not present

## 2020-05-31 DIAGNOSIS — F3131 Bipolar disorder, current episode depressed, mild: Secondary | ICD-10-CM

## 2020-05-31 DIAGNOSIS — F4310 Post-traumatic stress disorder, unspecified: Secondary | ICD-10-CM | POA: Diagnosis not present

## 2020-05-31 DIAGNOSIS — F3181 Bipolar II disorder: Secondary | ICD-10-CM

## 2020-05-31 DIAGNOSIS — G3184 Mild cognitive impairment, so stated: Secondary | ICD-10-CM | POA: Diagnosis not present

## 2020-05-31 DIAGNOSIS — F411 Generalized anxiety disorder: Secondary | ICD-10-CM

## 2020-05-31 DIAGNOSIS — G251 Drug-induced tremor: Secondary | ICD-10-CM | POA: Diagnosis not present

## 2020-05-31 DIAGNOSIS — F4001 Agoraphobia with panic disorder: Secondary | ICD-10-CM

## 2020-05-31 DIAGNOSIS — F5105 Insomnia due to other mental disorder: Secondary | ICD-10-CM

## 2020-05-31 MED ORDER — LAMOTRIGINE 100 MG PO TABS: 100.0000 mg | ORAL_TABLET | Freq: Two times a day (BID) | ORAL | 5 refills | Status: DC

## 2020-05-31 NOTE — Progress Notes (Signed)
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 brother about helping her.   Discussed potential metabolic side effects associated with atypical antipsychotics, as well as potential risk for movement side effects. Advised pt to contact office if movement side effects occur.   Consider increasing lithium but she's prone to toxicity. May 20, 2019 lithium level 0.9 on 450 mg daily and creatinine was 1.1 with normal calcium 07/21/19 lithium 0.4 on 450 mg daily, normal CMP 10/01/19 lithium level 0.8 at [facility] scanned in 12/01/19 Lithium level 1.0 on 450 mg every afternoon. Creatinine 1.1 and calcium 9.8.  DT tremor: Continue lithium to 300 mg only on Sunday, Tuesday, Thursday, and Saturday On Monday, Wednesday, Friday take 300 mg AND 150 mg capsules of lithium.  Hesitate to go any lower in lithium. Encourage exercise of upper body.  For intermittent tremor and jerks avoid propranolol DT DDI.   Using pill box to keep up with meds.  OK Ativan prn for dental procedure 1-2 mg.  No driving after it.  PCP Elyn Peers, Genelle Bal  Continue therapy  with Dr. Cheryln Manly q 2 weeks.  Bring meds to office. No med changes today  Follow-up 3 mos  Lynder Parents MD, DFAPA  Please see After Visit Summary for patient specific instructions.  Future Appointments  Date Time Provider [place]  06/08/2020  1:00 PM Oren Binet, PhD [facility] None  06/22/2020  1:00 PM Oren Binet, PhD [facility] None  06/27/2020  2:15 PM [facility]    No orders of the defined types were placed in this encounter.     -------------------------------

## 2020-06-02 DIAGNOSIS — M542 Cervicalgia: Secondary | ICD-10-CM | POA: Diagnosis not present

## 2020-06-02 DIAGNOSIS — G518 Other disorders of facial nerve: Secondary | ICD-10-CM | POA: Diagnosis not present

## 2020-06-02 DIAGNOSIS — G43719 Chronic migraine without aura, intractable, without status migrainosus: Secondary | ICD-10-CM | POA: Diagnosis not present

## 2020-06-02 DIAGNOSIS — M791 Myalgia, unspecified site: Secondary | ICD-10-CM | POA: Diagnosis not present

## 2020-06-07 ENCOUNTER — Encounter (HOSPITAL_BASED_OUTPATIENT_CLINIC_OR_DEPARTMENT_OTHER): Payer: Self-pay | Admitting: Orthopedic Surgery

## 2020-06-07 ENCOUNTER — Telehealth: Payer: Self-pay | Admitting: Pharmacist

## 2020-06-07 NOTE — Progress Notes (Signed)
Dr. Jillyn Hidden reviewed pt's hx, pt's last cardiology visit on 02/29/2020, pt's medications and pt taking Coumadin.  Dr. Jillyn Hidden informed that pt can hold Coumadin for 5 days without requiring Lovenox bridging per [facility] [place], RPH-CPP.  Dr. Jillyn Hidden agreed with pt having surgery here at [facility] on 06/16/2020.

## 2020-06-07 NOTE — Telephone Encounter (Signed)
Pt left a voicemail on the Coumadin line stating she needs to move her next appt since she has an upcoming procedure, and that she'll also need confirmation that she can hold her warfarin for 5 days prior to her procedure.  She is currently scheduled for joint arthrotomy and biopsy on 06/17/19. She takes warfarin for afib with CHADS2VASc score of 3 (age, sex, DM). Stroke is listed on PMH, however this was added during 10/22/18 hospitalization for AMS; her acute encephalopathy was actually due to lithium toxicity as her MRI revealed no acute infarct. She can hold her warfarin for 5 days without requiring Lovenox bridging. Rescheduled INR appt to 1 week after procedure. Pt aware of plan.

## 2020-06-08 ENCOUNTER — Ambulatory Visit (INDEPENDENT_AMBULATORY_CARE_PROVIDER_SITE_OTHER): Payer: Medicare Other | Admitting: Psychology

## 2020-06-08 DIAGNOSIS — F3181 Bipolar II disorder: Secondary | ICD-10-CM

## 2020-06-09 DIAGNOSIS — M961 Postlaminectomy syndrome, not elsewhere classified: Secondary | ICD-10-CM | POA: Diagnosis not present

## 2020-06-09 DIAGNOSIS — Z79891 Long term (current) use of opiate analgesic: Secondary | ICD-10-CM | POA: Diagnosis not present

## 2020-06-09 DIAGNOSIS — Z79899 Other long term (current) drug therapy: Secondary | ICD-10-CM | POA: Diagnosis not present

## 2020-06-09 DIAGNOSIS — M5459 Other low back pain: Secondary | ICD-10-CM | POA: Diagnosis not present

## 2020-06-09 DIAGNOSIS — G894 Chronic pain syndrome: Secondary | ICD-10-CM | POA: Diagnosis not present

## 2020-06-09 DIAGNOSIS — M47816 Spondylosis without myelopathy or radiculopathy, lumbar region: Secondary | ICD-10-CM | POA: Diagnosis not present

## 2020-06-10 ENCOUNTER — Other Ambulatory Visit: Payer: Self-pay | Admitting: Psychiatry

## 2020-06-10 DIAGNOSIS — F3132 Bipolar disorder, current episode depressed, moderate: Secondary | ICD-10-CM

## 2020-06-13 ENCOUNTER — Other Ambulatory Visit (HOSPITAL_COMMUNITY)
Admission: RE | Admit: 2020-06-13 | Discharge: 2020-06-13 | Disposition: A | Discharge status: Home / Self Care | Payer: Medicare Other | Source: Ambulatory Visit | Attending: Orthopedic Surgery | Admitting: Orthopedic Surgery

## 2020-06-13 DIAGNOSIS — Z01812 Encounter for preprocedural laboratory examination: Secondary | ICD-10-CM | POA: Insufficient documentation

## 2020-06-13 DIAGNOSIS — Z20822 Contact with and (suspected) exposure to covid-19: Secondary | ICD-10-CM | POA: Insufficient documentation

## 2020-06-13 LAB — SARS CORONAVIRUS 2 (TAT 6-24 HRS): SARS Coronavirus 2: NEGATIVE

## 2020-06-14 ENCOUNTER — Other Ambulatory Visit (HOSPITAL_COMMUNITY): Payer: Medicare Other

## 2020-06-15 ENCOUNTER — Encounter (HOSPITAL_BASED_OUTPATIENT_CLINIC_OR_DEPARTMENT_OTHER)
Admission: RE | Admit: 2020-06-15 | Discharge: 2020-06-15 | Disposition: A | Discharge status: Home / Self Care | Payer: Medicare Other | Source: Ambulatory Visit | Attending: Orthopedic Surgery | Admitting: Orthopedic Surgery

## 2020-06-15 DIAGNOSIS — Z7901 Long term (current) use of anticoagulants: Secondary | ICD-10-CM | POA: Diagnosis not present

## 2020-06-15 DIAGNOSIS — Z7982 Long term (current) use of aspirin: Secondary | ICD-10-CM | POA: Diagnosis not present

## 2020-06-15 DIAGNOSIS — Z885 Allergy status to narcotic agent status: Secondary | ICD-10-CM | POA: Diagnosis not present

## 2020-06-15 DIAGNOSIS — Z79890 Hormone replacement therapy (postmenopausal): Secondary | ICD-10-CM | POA: Diagnosis not present

## 2020-06-15 DIAGNOSIS — Z881 Allergy status to other antibiotic agents status: Secondary | ICD-10-CM | POA: Diagnosis not present

## 2020-06-15 DIAGNOSIS — M89571 Osteolysis, right ankle and foot: Secondary | ICD-10-CM | POA: Diagnosis not present

## 2020-06-15 DIAGNOSIS — F319 Bipolar disorder, unspecified: Secondary | ICD-10-CM | POA: Diagnosis not present

## 2020-06-15 DIAGNOSIS — Z882 Allergy status to sulfonamides status: Secondary | ICD-10-CM | POA: Diagnosis not present

## 2020-06-15 DIAGNOSIS — Z880 Allergy status to penicillin: Secondary | ICD-10-CM | POA: Diagnosis not present

## 2020-06-15 DIAGNOSIS — I4891 Unspecified atrial fibrillation: Secondary | ICD-10-CM | POA: Diagnosis not present

## 2020-06-15 DIAGNOSIS — Z96698 Presence of other orthopedic joint implants: Secondary | ICD-10-CM | POA: Diagnosis not present

## 2020-06-15 DIAGNOSIS — Z888 Allergy status to other drugs, medicaments and biological substances status: Secondary | ICD-10-CM | POA: Diagnosis not present

## 2020-06-15 DIAGNOSIS — Z79899 Other long term (current) drug therapy: Secondary | ICD-10-CM | POA: Diagnosis not present

## 2020-06-15 DIAGNOSIS — Z886 Allergy status to analgesic agent status: Secondary | ICD-10-CM | POA: Diagnosis not present

## 2020-06-15 LAB — PROTIME-INR
INR: 1.2 (ref 0.8–1.2)
Prothrombin Time: 15.2 seconds (ref 11.4–15.2)

## 2020-06-15 NOTE — Progress Notes (Signed)
      Enhanced Recovery after Surgery for Orthopedics Enhanced Recovery after Surgery is a protocol used to improve the stress on your body and your recovery after surgery.  Patient Instructions  . The night before surgery:  o No food after midnight. ONLY clear liquids after midnight  . The day of surgery (if you do NOT have diabetes):  o Drink ONE (1) Pre-Surgery Clear Ensure as directed.   o This drink was given to you during your hospital  pre-op appointment visit. o The pre-op nurse will instruct you on the time to drink the  Pre-Surgery Ensure depending on your surgery time. o Finish the drink at the designated time by the pre-op nurse.  o Nothing else to drink after completing the  Pre-Surgery Clear Ensure.  . The day of surgery (if you have diabetes): o Drink ONE (1) Gatorade 2 (G2) as directed. o This drink was given to you during your hospital  pre-op appointment visit.  o The pre-op nurse will instruct you on the time to drink the   Gatorade 2 (G2) depending on your surgery time. o Color of the Gatorade may vary. Red is not allowed. o Nothing else to drink after completing the  Gatorade 2 (G2).         If you have questions, please contact your surgeon's office.  Patient refused ERAS drink, education given on drink and patient verbalized understanding.

## 2020-06-16 ENCOUNTER — Encounter (HOSPITAL_BASED_OUTPATIENT_CLINIC_OR_DEPARTMENT_OTHER): Payer: Self-pay | Admitting: Orthopedic Surgery

## 2020-06-16 ENCOUNTER — Encounter (HOSPITAL_BASED_OUTPATIENT_CLINIC_OR_DEPARTMENT_OTHER)
Admission: RE | Disposition: A | Discharge status: Home / Self Care | Payer: Self-pay | Source: Home / Self Care | Attending: Orthopedic Surgery

## 2020-06-16 ENCOUNTER — Other Ambulatory Visit: Payer: Self-pay

## 2020-06-16 ENCOUNTER — Ambulatory Visit (HOSPITAL_BASED_OUTPATIENT_CLINIC_OR_DEPARTMENT_OTHER)
Admission: RE | Admit: 2020-06-16 | Discharge: 2020-06-16 | Disposition: A | Discharge status: Home / Self Care | Payer: Medicare Other | Attending: Orthopedic Surgery | Admitting: Orthopedic Surgery

## 2020-06-16 ENCOUNTER — Ambulatory Visit (HOSPITAL_BASED_OUTPATIENT_CLINIC_OR_DEPARTMENT_OTHER): Payer: Medicare Other | Admitting: Certified Registered"

## 2020-06-16 DIAGNOSIS — Z885 Allergy status to narcotic agent status: Secondary | ICD-10-CM | POA: Insufficient documentation

## 2020-06-16 DIAGNOSIS — Z96698 Presence of other orthopedic joint implants: Secondary | ICD-10-CM | POA: Diagnosis not present

## 2020-06-16 DIAGNOSIS — M89571 Osteolysis, right ankle and foot: Secondary | ICD-10-CM | POA: Insufficient documentation

## 2020-06-16 DIAGNOSIS — M898X7 Other specified disorders of bone, ankle and foot: Secondary | ICD-10-CM | POA: Diagnosis not present

## 2020-06-16 DIAGNOSIS — Z7982 Long term (current) use of aspirin: Secondary | ICD-10-CM | POA: Insufficient documentation

## 2020-06-16 DIAGNOSIS — Z7901 Long term (current) use of anticoagulants: Secondary | ICD-10-CM | POA: Insufficient documentation

## 2020-06-16 DIAGNOSIS — I4891 Unspecified atrial fibrillation: Secondary | ICD-10-CM | POA: Diagnosis not present

## 2020-06-16 DIAGNOSIS — E7800 Pure hypercholesterolemia, unspecified: Secondary | ICD-10-CM | POA: Diagnosis not present

## 2020-06-16 DIAGNOSIS — Z882 Allergy status to sulfonamides status: Secondary | ICD-10-CM | POA: Insufficient documentation

## 2020-06-16 DIAGNOSIS — F319 Bipolar disorder, unspecified: Secondary | ICD-10-CM | POA: Diagnosis not present

## 2020-06-16 DIAGNOSIS — E559 Vitamin D deficiency, unspecified: Secondary | ICD-10-CM | POA: Diagnosis not present

## 2020-06-16 DIAGNOSIS — M25774 Osteophyte, right foot: Secondary | ICD-10-CM | POA: Diagnosis not present

## 2020-06-16 DIAGNOSIS — Z881 Allergy status to other antibiotic agents status: Secondary | ICD-10-CM | POA: Diagnosis not present

## 2020-06-16 DIAGNOSIS — M79671 Pain in right foot: Secondary | ICD-10-CM

## 2020-06-16 DIAGNOSIS — Z880 Allergy status to penicillin: Secondary | ICD-10-CM | POA: Insufficient documentation

## 2020-06-16 DIAGNOSIS — Z888 Allergy status to other drugs, medicaments and biological substances status: Secondary | ICD-10-CM | POA: Insufficient documentation

## 2020-06-16 DIAGNOSIS — Z886 Allergy status to analgesic agent status: Secondary | ICD-10-CM | POA: Insufficient documentation

## 2020-06-16 DIAGNOSIS — M7989 Other specified soft tissue disorders: Secondary | ICD-10-CM | POA: Diagnosis not present

## 2020-06-16 DIAGNOSIS — Z79890 Hormone replacement therapy (postmenopausal): Secondary | ICD-10-CM | POA: Insufficient documentation

## 2020-06-16 DIAGNOSIS — Z79899 Other long term (current) drug therapy: Secondary | ICD-10-CM | POA: Insufficient documentation

## 2020-06-16 HISTORY — PX: SYNOVIAL BIOPSY: SHX5041

## 2020-06-16 SURGERY — BIOPSY, SYNOVIUM
Anesthesia: Monitor Anesthesia Care | Site: Foot | Laterality: Right

## 2020-06-16 MED ORDER — FENTANYL CITRATE (PF) 100 MCG/2ML IJ SOLN: 25.0000 ug | INTRAMUSCULAR | Status: DC | PRN

## 2020-06-16 MED ORDER — DEXMEDETOMIDINE (PRECEDEX) IN NS 20 MCG/5ML (4 MCG/ML) IV SYRINGE
PREFILLED_SYRINGE | INTRAVENOUS | Status: DC | PRN
  Administered 2020-06-16: 8 ug via INTRAVENOUS

## 2020-06-16 MED ORDER — VANCOMYCIN HCL 500 MG IV SOLR
INTRAVENOUS | Status: AC
  Filled 2020-06-16: qty 500

## 2020-06-16 MED ORDER — MIDAZOLAM HCL 2 MG/2ML IJ SOLN
INTRAMUSCULAR | Status: AC
  Filled 2020-06-16: qty 2

## 2020-06-16 MED ORDER — 0.9 % SODIUM CHLORIDE (POUR BTL) OPTIME
TOPICAL | Status: DC | PRN
  Administered 2020-06-16: 200 mL

## 2020-06-16 MED ORDER — ONDANSETRON HCL 4 MG/2ML IJ SOLN
INTRAMUSCULAR | Status: DC | PRN
  Administered 2020-06-16: 4 mg via INTRAVENOUS

## 2020-06-16 MED ORDER — PROPOFOL 500 MG/50ML IV EMUL
INTRAVENOUS | Status: DC | PRN
  Administered 2020-06-16: 75 ug/kg/min via INTRAVENOUS

## 2020-06-16 MED ORDER — LIDOCAINE-EPINEPHRINE 1 %-1:100000 IJ SOLN
INTRAMUSCULAR | Status: AC
  Filled 2020-06-16: qty 1

## 2020-06-16 MED ORDER — EPHEDRINE SULFATE 50 MG/ML IJ SOLN
INTRAMUSCULAR | Status: DC | PRN
  Administered 2020-06-16: 5 mg via INTRAVENOUS
  Administered 2020-06-16: 10 mg via INTRAVENOUS

## 2020-06-16 MED ORDER — FENTANYL CITRATE (PF) 100 MCG/2ML IJ SOLN
INTRAMUSCULAR | Status: AC
  Filled 2020-06-16: qty 2

## 2020-06-16 MED ORDER — CEFAZOLIN SODIUM-DEXTROSE 2-3 GM-%(50ML) IV SOLR
INTRAVENOUS | Status: DC | PRN
  Administered 2020-06-16: 2 g via INTRAVENOUS

## 2020-06-16 MED ORDER — OXYCODONE HCL 5 MG PO TABS: 5.0000 mg | ORAL_TABLET | Freq: Four times a day (QID) | ORAL | 0 refills | Status: AC | PRN

## 2020-06-16 MED ORDER — LACTATED RINGERS IV SOLN: INTRAVENOUS | Status: DC

## 2020-06-16 MED ORDER — VANCOMYCIN HCL 500 MG IV SOLR
INTRAVENOUS | Status: DC | PRN
  Administered 2020-06-16: 500 mg via TOPICAL

## 2020-06-16 MED ORDER — BUPIVACAINE-EPINEPHRINE (PF) 0.5% -1:200000 IJ SOLN
INTRAMUSCULAR | Status: DC | PRN
  Administered 2020-06-16: 10 mL

## 2020-06-16 MED ORDER — BUPIVACAINE HCL (PF) 0.5 % IJ SOLN
INTRAMUSCULAR | Status: AC
  Filled 2020-06-16: qty 30

## 2020-06-16 MED ORDER — ONDANSETRON HCL 4 MG/2ML IJ SOLN: 4.0000 mg | Freq: Once | INTRAMUSCULAR | Status: DC | PRN

## 2020-06-16 MED ORDER — FENTANYL CITRATE (PF) 100 MCG/2ML IJ SOLN
INTRAMUSCULAR | Status: DC | PRN
  Administered 2020-06-16: 25 ug via INTRAVENOUS

## 2020-06-16 MED ORDER — CEFAZOLIN SODIUM-DEXTROSE 2-4 GM/100ML-% IV SOLN
INTRAVENOUS | Status: AC
  Filled 2020-06-16: qty 100

## 2020-06-16 MED ORDER — LIDOCAINE 2% (20 MG/ML) 5 ML SYRINGE
INTRAMUSCULAR | Status: DC | PRN
  Administered 2020-06-16: 60 mg via INTRAVENOUS

## 2020-06-16 MED ORDER — SODIUM CHLORIDE 0.9 % IV SOLN: INTRAVENOUS | Status: DC

## 2020-06-16 SURGICAL SUPPLY — 69 items
APL PRP STRL LF DISP 70% ISPRP (MISCELLANEOUS) ×1
BANDAGE ESMARK 6X9 LF (GAUZE/BANDAGES/DRESSINGS) IMPLANT
BLADE MINI RND TIP GREEN BEAV (BLADE) IMPLANT
BLADE SURG 15 STRL LF DISP TIS (BLADE) ×2 IMPLANT
BLADE SURG 15 STRL SS (BLADE) ×4
BNDG CMPR 9X4 STRL LF SNTH (GAUZE/BANDAGES/DRESSINGS) ×1
BNDG CMPR 9X6 STRL LF SNTH (GAUZE/BANDAGES/DRESSINGS)
BNDG COHESIVE 4X5 TAN STRL (GAUZE/BANDAGES/DRESSINGS) ×2 IMPLANT
BNDG COHESIVE 6X5 TAN STRL LF (GAUZE/BANDAGES/DRESSINGS) IMPLANT
BNDG CONFORM 3 STRL LF (GAUZE/BANDAGES/DRESSINGS) ×4 IMPLANT
BNDG ESMARK 4X9 LF (GAUZE/BANDAGES/DRESSINGS) ×2 IMPLANT
BNDG ESMARK 6X9 LF (GAUZE/BANDAGES/DRESSINGS)
CHLORAPREP W/TINT 26 (MISCELLANEOUS) ×2 IMPLANT
CNTNR URN SCR LID CUP LEK RST (MISCELLANEOUS) ×2 IMPLANT
CONT SPEC 4OZ STRL OR WHT (MISCELLANEOUS) ×4
COVER BACK TABLE 60X90IN (DRAPES) ×2 IMPLANT
COVER WAND RF STERILE (DRAPES) IMPLANT
CUFF TOURN SGL QUICK 34 (TOURNIQUET CUFF)
CUFF TRNQT CYL 34X4.125X (TOURNIQUET CUFF) IMPLANT
DRAPE EXTREMITY T 121X128X90 (DISPOSABLE) ×2 IMPLANT
DRAPE OEC MINIVIEW 54X84 (DRAPES) ×2 IMPLANT
DRAPE SURG 17X23 STRL (DRAPES) IMPLANT
DRAPE U-SHAPE 47X51 STRL (DRAPES) ×2 IMPLANT
DRSG MEPITEL 4X7.2 (GAUZE/BANDAGES/DRESSINGS) ×2 IMPLANT
DRSG PAD ABDOMINAL 8X10 ST (GAUZE/BANDAGES/DRESSINGS) ×2 IMPLANT
DRSG TEGADERM 4X4.75 (GAUZE/BANDAGES/DRESSINGS) IMPLANT
ELECT REM PT RETURN 9FT ADLT (ELECTROSURGICAL) ×2
ELECTRODE REM PT RTRN 9FT ADLT (ELECTROSURGICAL) ×1 IMPLANT
GAUZE 4X4 16PLY RFD (DISPOSABLE) IMPLANT
GAUZE SPONGE 4X4 12PLY STRL (GAUZE/BANDAGES/DRESSINGS) ×4 IMPLANT
GLOVE SRG 8 PF TXTR STRL LF DI (GLOVE) ×2 IMPLANT
GLOVE SURG ENC MOIS LTX SZ8 (GLOVE) ×4 IMPLANT
GLOVE SURG LTX SZ8 (GLOVE) ×2 IMPLANT
GLOVE SURG POLYISO LF SZ7 (GLOVE) ×2 IMPLANT
GLOVE SURG UNDER POLY LF SZ7 (GLOVE) ×4 IMPLANT
GLOVE SURG UNDER POLY LF SZ8 (GLOVE) ×4
GOWN STRL REUS W/ TWL LRG LVL3 (GOWN DISPOSABLE) ×1 IMPLANT
GOWN STRL REUS W/ TWL XL LVL3 (GOWN DISPOSABLE) ×2 IMPLANT
GOWN STRL REUS W/TWL LRG LVL3 (GOWN DISPOSABLE) ×2
GOWN STRL REUS W/TWL XL LVL3 (GOWN DISPOSABLE) ×4
NEEDLE HYPO 22GX1.5 SAFETY (NEEDLE) IMPLANT
NEEDLE HYPO 25X1 1.5 SAFETY (NEEDLE) ×2 IMPLANT
NS IRRIG 1000ML POUR BTL (IV SOLUTION) ×2 IMPLANT
PACK BASIN DAY SURGERY FS (CUSTOM PROCEDURE TRAY) ×2 IMPLANT
PAD CAST 4YDX4 CTTN HI CHSV (CAST SUPPLIES) ×1 IMPLANT
PADDING CAST COTTON 4X4 STRL (CAST SUPPLIES) ×2
PADDING CAST COTTON 6X4 STRL (CAST SUPPLIES) IMPLANT
PENCIL SMOKE EVACUATOR (MISCELLANEOUS) ×2 IMPLANT
SANITIZER HAND PURELL 535ML FO (MISCELLANEOUS) ×2 IMPLANT
SHEET MEDIUM DRAPE 40X70 STRL (DRAPES) ×2 IMPLANT
SLEEVE SCD COMPRESS KNEE MED (STOCKING) ×2 IMPLANT
SPONGE LAP 18X18 RF (DISPOSABLE) ×2 IMPLANT
STOCKINETTE 6  STRL (DRAPES) ×1
STOCKINETTE 6 STRL (DRAPES) ×1 IMPLANT
STRIP CLOSURE SKIN 1/2X4 (GAUZE/BANDAGES/DRESSINGS) IMPLANT
SUCTION FRAZIER HANDLE 10FR (MISCELLANEOUS) ×1
SUCTION TUBE FRAZIER 10FR DISP (MISCELLANEOUS) ×1 IMPLANT
SUT ETHILON 3 0 PS 1 (SUTURE) ×2 IMPLANT
SUT MNCRL AB 3-0 PS2 18 (SUTURE) ×2 IMPLANT
SUT MNCRL AB 4-0 PS2 18 (SUTURE) IMPLANT
SUT VIC AB 0 SH 27 (SUTURE) IMPLANT
SUT VIC AB 2-0 PS2 27 (SUTURE) IMPLANT
SWAB COLLECTION DEVICE MRSA (MISCELLANEOUS) IMPLANT
SYR BULB EAR ULCER 3OZ GRN STR (SYRINGE) ×2 IMPLANT
SYR CONTROL 10ML LL (SYRINGE) ×2 IMPLANT
TOWEL GREEN STERILE FF (TOWEL DISPOSABLE) ×2 IMPLANT
TUBE CONNECTING 20X1/4 (TUBING) ×2 IMPLANT
UNDERPAD 30X36 HEAVY ABSORB (UNDERPADS AND DIAPERS) ×2 IMPLANT
YANKAUER SUCT BULB TIP NO VENT (SUCTIONS) IMPLANT

## 2020-06-16 NOTE — Anesthesia Preprocedure Evaluation (Signed)
Anesthesia Evaluation  Patient identified by MRN, date of birth, ID band Patient awake    Reviewed: Allergy & Precautions, NPO status , Patient's Chart, lab work & pertinent test results  Airway Mallampati: II  TM Distance: >3 FB Neck ROM: Full    Dental  (+) Edentulous Upper Lower implant posts:   Pulmonary    breath sounds clear to auscultation       Cardiovascular  Rhythm:Regular Rate:Normal     Neuro/Psych    GI/Hepatic   Endo/Other    Renal/GU      Musculoskeletal   Abdominal   Peds  Hematology   Anesthesia Other Findings   Reproductive/Obstetrics                             Anesthesia Physical Anesthesia Plan  ASA: III  Anesthesia Plan: MAC   Post-op Pain Management:    Induction: Intravenous  PONV Risk Score and Plan: Ondansetron and Propofol infusion  Airway Management Planned: Natural Airway and Simple Face Mask  Additional Equipment:   Intra-op Plan:   Post-operative Plan:   Informed Consent: I have reviewed the patients History and Physical, chart, labs and discussed the procedure including the risks, benefits and alternatives for the proposed anesthesia with the patient or authorized representative who has indicated his/her understanding and acceptance.     Dental advisory given  Plan Discussed with: CRNA and Anesthesiologist  Anesthesia Plan Comments: (Plan MAC with digital block)        Anesthesia Quick Evaluation

## 2020-06-16 NOTE — Anesthesia Postprocedure Evaluation (Signed)
Anesthesia Post Note  Patient: Meredith [name]  Procedure(s) Performed: Right hallux interphalangeal joint arthrotomy and biopsy (Right Foot)     Patient location during evaluation: PACU Anesthesia Type: MAC Level of consciousness: awake and alert Pain management: pain level controlled Vital Signs Assessment: post-procedure vital signs reviewed and stable Respiratory status: spontaneous breathing, nonlabored ventilation, respiratory function stable and patient connected to nasal cannula oxygen Cardiovascular status: stable and blood pressure returned to baseline Postop Assessment: no apparent nausea or vomiting Anesthetic complications: no   No complications documented.  Last Vitals:  Vitals:   06/16/20 1000 06/16/20 1024  BP: 132/67   Pulse: 69 74  Resp: 18 20  Temp:  36.5 C  SpO2: 100% 99%    Last Pain:  Vitals:   06/16/20 1024  TempSrc:   PainSc: 0-No pain                 Idara Woodside COKER

## 2020-06-16 NOTE — H&P (Signed)
Meredith [name] is an 70 y.o. female.   Chief Complaint: Right foot pain HPI: The patient is a 70 year old female with a past medical history significant for atrial fibrillation and bipolar disorder.  She has had podiatric surgery to her right forefoot including a joint replacement surgery at the hallux MP joint and debridement of her IP joint with resection arthroplasty.  She presented to me for evaluation and treatment of persistent right foot pain postoperatively.  She has untreated hammertoe deformities that are painful.  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  Differential diagnosis includes low-grade infection as well as gout.  She presents today for right hallux interphalangeal joint arthrotomy and biopsy.  She reports allergies to multiple antibiotics.  She gets a rash with any penicillin.  She has had diarrhea after Keflex.  Sed rate, CRP and CBC were essentially normal.  Past Medical History:  Diagnosis Date  . Anxiety   . Atrial fibrillation ([place])   . Bipolar 2 disorder ([place])   . Depression   . History of cardioversion    x2  . Hyperlipidemia   . Hypothyroidism   . Migraine headache   . Osteoporosis     Past Surgical History:  Procedure Laterality Date  . ABLATION    . BACK SURGERY  2014  . CARDIOVERSION  12/2013, 01/2014   x2  . KNEE SURGERY     ORTHOSCOPIC  . LUNG REMOVAL, PARTIAL Right    BENIGN LUNG CYST  . THORACIC SPINE SURGERY Right   . THUMB ARTHROSCOPY      Family History  Problem Relation Age of Onset  . Arthritis Mother   . Depression Mother   . Diabetes Mother   . Heart disease Mother   . Stroke Mother   . Early death Father   . Heart disease Father   . Atrial fibrillation Brother   . Hyperlipidemia Brother   . Multiple sclerosis Sister   . Alcohol abuse Brother   . Asthma Brother   . Drug abuse Brother   . Hypertension Brother   . Mental illness Brother    Social History:   reports that she has never smoked. She has never used smokeless tobacco. She reports that she does not drink alcohol and does not use drugs.  Allergies:  Allergies  Allergen Reactions  . Codeine Anaphylaxis    Patient states she can take codeine but not percocet   . Adhesive [Tape]     blister  . Celebrex [Celecoxib] Hives  . Erythromycin Other (See Comments)  . Sulfa Antibiotics Hives  . Tricyclic Antidepressants     Doesn't help  . Amoxicillin Rash  . Ampicillin Rash  . Cephalexin Rash  . Penicillins Rash    Did it involve swelling of the face/tongue/throat, SOB, or low BP? Yes Did it involve sudden or severe rash/hives, skin peeling, or any reaction on the inside of your mouth or nose? NO Did you need to seek medical attention at a hospital or doctor's office? NO When did it last happen? childhood If all above answers are "NO", may proceed with cephalosporin use.    Medications Prior to Admission  Medication Sig Dispense Refill  . ARIPiprazole (ABILIFY) 30 MG tablet Take 1 tablet (30 mg total) by mouth daily. 30 tablet 5  . Ascorbic Acid (VITAMIN C) 1000 MG tablet Take 1,000 mg by mouth daily.    [name] aspirin 325 MG tablet Take 325 mg by mouth  daily as needed for headache.     . baclofen (LIORESAL) 10 MG tablet Take 1 tablet by mouth as needed.    . bisacodyl (DULCOLAX) 5 MG EC tablet PRN    . Cholecalciferol (VITAMIN D3) 125 MCG (5000 UT) TABS Take 1 tablet by mouth daily at 12 noon.    . docusate sodium (COLACE) 100 MG capsule 1 capsule as needed    . estradiol (ESTRACE) 1 MG tablet Take 1 mg by mouth daily.    . ferrous sulfate 325 (65 FE) MG tablet Take 325 mg by mouth 2 (two) times daily.    . fluticasone (FLONASE SENSIMIST) 27.5 MCG/SPRAY nasal spray Place 1 spray into the nose daily.    [name] Galcanezumab-gnlm (EMGALITY) 120 MG/ML SOAJ Inject into the skin.    [name] HYDROcodone-acetaminophen (NORCO) 7.5-325 MG tablet Take 1 tablet by mouth every 8 (eight) hours as needed  for moderate pain.    [name] ipratropium (ATROVENT) 0.03 % nasal spray 1-2 sprays in each nostril    . lamoTRIgine (LAMICTAL) 100 MG tablet Take 1 tablet (100 mg total) by mouth 2 (two) times daily. 60 tablet 5  . levocetirizine (XYZAL) 5 MG tablet Take 5 mg by mouth every evening.    [name] levothyroxine (SYNTHROID) 112 MCG tablet Take 112 mcg by mouth daily before breakfast.    . lithium carbonate 150 MG capsule Take 1 capsule (150 mg total) by mouth daily. (Patient taking differently: Take 150 mg by mouth. Takes only on Monday,Wednesday,Friday.) 90 capsule 0  . lithium carbonate 300 MG capsule Take 1 capsule (300 mg total) by mouth at bedtime. 90 capsule 1  . loperamide (IMODIUM) 2 MG capsule Take 1 capsule (2 mg total) by mouth as needed for diarrhea or loose stools. 30 capsule 0  . Melatonin 5 MG TABS Take 20 mg by mouth at bedtime.    . metoprolol succinate (TOPROL-XL) 50 MG 24 hr tablet Take 1 tablet (50 mg total) by mouth daily. Take with or immediately following a meal. 90 tablet 3  . ondansetron (ZOFRAN) 4 MG tablet Take 1 tablet (4 mg total) by mouth every 8 (eight) hours as needed for nausea or vomiting. 20 tablet 0  . pantoprazole (PROTONIX) 40 MG tablet Take 1 tablet (40 mg total) by mouth daily. 90 tablet 0  . pravastatin (PRAVACHOL) 20 MG tablet Take 20 mg by mouth daily.  3  . pregabalin (LYRICA) 150 MG capsule Take 150 mg by mouth 3 (three) times daily.    . QUEtiapine (SEROQUEL) 200 MG tablet TAKE 1 TABLET BY MOUTH AT BEDTIME 90 tablet 0  . senna (SENOKOT) 8.6 MG TABS tablet Take 2 capsules by mouth every other day.    . senna-docusate (PERI-COLACE) 8.6-50 MG tablet Take 2 tablets by mouth 2 (two) times daily. For constipation    . verapamil (VERELAN PM) 120 MG 24 hr capsule Take 1 capsule (120 mg total) by mouth at bedtime. For high blood pressure    . warfarin (COUMADIN) 2.5 MG tablet Take 1.5 - 2 tablets once daily or AS DIRECTED BY COUMADIN CLINIC 150 tablet 0  . clotrimazole  (LOTRIMIN) 1 % cream APPLY CREAM TOPICALLY TO AFFECTED AREA TWICE DAILY    . EPINEPHrine 0.3 mg/0.3 mL IJ SOAJ injection     . Erenumab-aooe (AIMOVIG) 140 MG/ML SOAJ Inject 140 mg into the skin every 30 (thirty) days. (Patient not taking: Reported on 05/31/2020)    . naloxone (NARCAN) 0.4 MG/ML injection     .  polyethylene glycol-electrolytes (NULYTELY) 420 g solution ML (Patient not taking: Reported on 05/31/2020)      Results for orders placed or performed during the hospital encounter of 2020/07/11 (from the past 48 hour(s))  PT- INR at PAT visit (Pre-admission Testing) per protocol     Status: None   Collection Time: 06/15/20 10:24 AM  Result Value Ref Range   Prothrombin Time 15.2 11.4 - 15.2 seconds   INR 1.2 0.8 - 1.2    Comment: (NOTE) INR goal varies based on device and disease states. Performed at [facility], [place] [address], [place], [place] [address]    No results found.  Review of Systems no recent fever, chills, nausea, vomiting or changes in her appetite  Blood pressure (!) 147/81, pulse 74, temperature 98.7 F (37.1 C), temperature source Oral, resp. rate 18, height 5' (1.524 m), weight 80.5 kg, SpO2 100 %. Physical Exam  Well-nourished well-developed woman in no apparent distress.  Alert and oriented.  Normal mood.  Affect is flat.  Extraocular motions are intact.  Respirations are unlabored.  Gait is normal.  Right hallux MP joint is nontender.  The IP joint has some tenderness but no erythema or significant swelling.  Pulses are palpable in the foot.  No lymphadenopathy.  Assessment/Plan Right hallux IP joint progressive osteolysis -to the operating room today for right hallux IP joint arthrotomy and biopsy.  We discussed the risks and benefits of the type of perioperative antibiotic to administer once cultures are obtained.  She has tolerated Ancef and penicillin with only a rash and diarrhea.  I explained that I believe the benefits of Ancef outweigh the potential  risks.  She understands and agrees.  We will hold antibiotics pending her cultures and administer Ancef.  The risks and benefits of the alternative treatment options have been discussed in detail.  The patient wishes to proceed with surgery and specifically understands risks of bleeding, infection, nerve damage, blood clots, need for additional surgery, amputation and death.   [name], MD 11-Jul-2020, 8:22 AM

## 2020-06-16 NOTE — Discharge Instructions (Addendum)
John Hewitt, MD [facility]  Please read the following information regarding your care after surgery.  Medications  You only need a prescription for the narcotic pain medicine (ex. oxycodone, Percocet, Norco).  All of the other medicines listed below are available over the counter. X Aleve 2 pills twice a day for the first 3 days after surgery. X acetominophen (Tylenol) 650 mg every 4-6 hours as you need for minor to moderate pain X oxycodone as prescribed for severe pain  Narcotic pain medicine (ex. oxycodone, Percocet, Vicodin) will cause constipation.  To prevent this problem, take the following medicines while you are taking any pain medicine. X docusate sodium (Colace) 100 mg twice a day X senna (Senokot) 2 tablets twice a day  Weight Bearing X Bear weight only on your operated foot in the post-op shoe.   Cast / Splint / Dressing X Keep your splint, cast or dressing clean and dry.  Don't put anything (coat hanger, pencil, etc) down inside of it.  If it gets damp, use a hair dryer on the cool setting to dry it.  If it gets soaked, call the office to schedule an appointment for a cast change.   After your dressing, cast or splint is removed; you may shower, but do not soak or scrub the wound.  Allow the water to run over it, and then gently pat it dry.  Swelling It is normal for you to have swelling where you had surgery.  To reduce swelling and pain, keep your toes above your nose for at least 3 days after surgery.  It may be necessary to keep your foot or leg elevated for several weeks.  If it hurts, it should be elevated.  Follow Up Call my office at [phone] when you are discharged from the hospital or surgery center to schedule an appointment to be seen two weeks after surgery.  Call my office at [phone] if you develop a fever >101.5 F, nausea, vomiting, bleeding from the surgical site or severe pain.     Post Anesthesia Home Care Instructions  Activity: Get  plenty of rest for the remainder of the day. A responsible individual must stay with you for 24 hours following the procedure.  For the next 24 hours, DO NOT: -Drive a car -Operate machinery -Drink alcoholic beverages -Take any medication unless instructed by your physician -Make any legal decisions or sign important papers.  Meals: Start with liquid foods such as gelatin or soup. Progress to regular foods as tolerated. Avoid greasy, spicy, heavy foods. If nausea and/or vomiting occur, drink only clear liquids until the nausea and/or vomiting subsides. Call your physician if vomiting continues.  Special Instructions/Symptoms: Your throat may feel dry or sore from the anesthesia or the breathing tube placed in your throat during surgery. If this causes discomfort, gargle with warm salt water. The discomfort should disappear within 24 hours.  If you had a scopolamine patch placed behind your ear for the management of post- operative nausea and/or vomiting:  1. The medication in the patch is effective for 72 hours, after which it should be removed.  Wrap patch in a tissue and discard in the trash. Wash hands thoroughly with soap and water. 2. You may remove the patch earlier than 72 hours if you experience unpleasant side effects which may include dry mouth, dizziness or visual disturbances. 3. Avoid touching the patch. Wash your hands with soap and water after contact with the patch.  Regional Anesthesia Blocks  1. Numbness or   the inability to move the "blocked" extremity may last from 3-48 hours after placement. The length of time depends on the medication injected and your individual response to the medication. If the numbness is not going away after 48 hours, call your surgeon.  2. The extremity that is blocked will need to be protected until the numbness is gone and the  Strength has returned. Because you cannot feel it, you will need to take extra care to avoid injury. Because it may be  weak, you may have difficulty moving it or using it. You may not know what position it is in without looking at it while the block is in effect.  3. For blocks in the legs and feet, returning to weight bearing and walking needs to be done carefully. You will need to wait until the numbness is entirely gone and the strength has returned. You should be able to move your leg and foot normally before you try and bear weight or walk. You will need someone to be with you when you first try to ensure you do not fall and possibly risk injury.  4. Bruising and tenderness at the needle site are common side effects and will resolve in a few days.  5. Persistent numbness or new problems with movement should be communicated to the surgeon or the [facility] ([phone])/ [facility] ([phone]).        

## 2020-06-16 NOTE — Op Note (Signed)
06/16/2020  9:11 AM  PATIENT:  Meredith [name]  70 y.o. female  PRE-OPERATIVE DIAGNOSIS:  Osteolytic lesion right hallux IP joint  POST-OPERATIVE DIAGNOSIS:  Same  Procedure(s):  Right hallux interphalangeal joint arthrotomy and biopsy  SURGEON:  [name], MD  ASSISTANT: Mechele Claude, PA-C  ANESTHESIA:   MAC, local  EBL:  minimal   TOURNIQUET:   Total Tourniquet Time Documented: Calf (Right) - 17 minutes Total: Calf (Right) - 17 minutes  COMPLICATIONS:  None apparent  DISPOSITION:  Extubated, awake and stable to recovery.  INDICATION FOR PROCEDURE: The patient is a 70 year old female with past medical history significant for atrial fibrillation and bipolar disorder.  She has a long history of right forefoot pain and has had podiatric surgery to the right hallux including MP joint replacement and IP joint resection arthroplasty.  She presented to me for evaluation of her hammertoe deformities.  Successive radiographs show progressive osteolysis of the hallux IP joint.  CBC, ESR and CRP are essentially negative.  An MRI was equivocal and listed gout as a possible diagnosis.  However the patient denies any history of gout.  The patient presents today for arthrotomy of the right hallux IP joint, biopsy and intraoperative cultures.    The risks and benefits of the alternative treatment options have been discussed in detail.  The patient wishes to proceed with surgery and specifically understands risks of bleeding, infection, nerve damage, blood clots, need for additional surgery, amputation and death.  PROCEDURE IN DETAIL: After preoperative consent was obtained the correct operative site was identified, the patient was brought the operating room and placed upon the operating table.  Preoperative antibiotics were held pending intraoperative cultures.  Surgical timeout was taken.  A digital block was performed after IV sedation was administered.  The right lower extremity was then  prepped and draped in standard sterile fashion.  The foot was exsanguinated and a 4 inch Esmarch tourniquet wrapped around the ankle.  The patient's previous dorsal incision was identified over the hallux.  The distal portion of the incision was made and extended distally to the IP joint.  Dissection was carried sharply down through the subcutaneous tissues.  The extensor houses longus tendon was identified.  Tenolysis was performed allowing the tendon to be mobilized laterally exposing the IP joint.  The IP joint was noted to be grossly unstable.  There was significant erosion of the distal portion of the proximal phalanx.  Curette and rondure were used to remove tissue from within the joint.  Once adequate microbiologic and pathologic specimens were obtained, IV antibiotics were administered.  The wound was irrigated copiously and sprinkled with vancomycin powder.  The skin incision was closed with horizontal mattress sutures of 3-0 nylon.  The specimens were divided and sent to microbiology and pathology.  The tourniquet was released.  Sterile dressings were applied followed by compression wrap.  The patient was awakened from anesthesia and transported to the recovery room in stable condition.   FOLLOW UP PLAN: Weightbearing as tolerated in a flat postop shoe.  Follow-up in the office in 2 weeks for path results, micro results and suture removal.  We will then formulate a plan for treatment of her hammertoes based on what we see from the pathology and culture results.    Mechele Claude PA-C was present and scrubbed for the duration of the operative case. His assistance was essential in positioning the patient, prepping and draping, gaining and maintaining exposure, performing the operation, closing and dressing  the wounds and applying the splint.

## 2020-06-16 NOTE — Transfer of Care (Signed)
Immediate Anesthesia Transfer of Care Note  Patient: Meredith [name]  Procedure(s) Performed: Right hallux interphalangeal joint arthrotomy and biopsy (Right Foot)  Patient Location: PACU  Anesthesia Type:MAC  Level of Consciousness: awake, alert , oriented and patient cooperative  Airway & Oxygen Therapy: Patient Spontanous Breathing and Patient connected to face mask oxygen  Post-op Assessment: Report given to RN and Post -op Vital signs reviewed and stable  Post vital signs: Reviewed and stable  Last Vitals:  Vitals Value Taken Time  BP    Temp    Pulse    Resp 12 06/16/20 0920  SpO2    Vitals shown include unvalidated device data.  Last Pain:  Vitals:   06/16/20 0701  TempSrc: Oral  PainSc: 0-No pain         Complications: No complications documented.

## 2020-06-17 ENCOUNTER — Encounter (HOSPITAL_BASED_OUTPATIENT_CLINIC_OR_DEPARTMENT_OTHER): Payer: Self-pay | Admitting: Orthopedic Surgery

## 2020-06-17 LAB — SURGICAL PATHOLOGY

## 2020-06-21 LAB — AEROBIC/ANAEROBIC CULTURE W GRAM STAIN (SURGICAL/DEEP WOUND)

## 2020-06-22 ENCOUNTER — Ambulatory Visit (INDEPENDENT_AMBULATORY_CARE_PROVIDER_SITE_OTHER): Payer: Medicare Other | Admitting: Psychology

## 2020-06-22 ENCOUNTER — Ambulatory Visit (HOSPITAL_BASED_OUTPATIENT_CLINIC_OR_DEPARTMENT_OTHER): Admit: 2020-06-22 | Payer: Medicare Other | Admitting: Podiatry

## 2020-06-22 ENCOUNTER — Encounter (HOSPITAL_BASED_OUTPATIENT_CLINIC_OR_DEPARTMENT_OTHER): Payer: Self-pay

## 2020-06-22 DIAGNOSIS — F3181 Bipolar II disorder: Secondary | ICD-10-CM

## 2020-06-22 SURGERY — CORRECTION, HAMMER TOE
Anesthesia: Monitor Anesthesia Care | Site: Toe | Laterality: Right

## 2020-06-23 ENCOUNTER — Other Ambulatory Visit: Payer: Self-pay

## 2020-06-23 ENCOUNTER — Ambulatory Visit (INDEPENDENT_AMBULATORY_CARE_PROVIDER_SITE_OTHER): Payer: Medicare Other

## 2020-06-23 DIAGNOSIS — Z5181 Encounter for therapeutic drug level monitoring: Secondary | ICD-10-CM | POA: Diagnosis not present

## 2020-06-23 DIAGNOSIS — I480 Paroxysmal atrial fibrillation: Secondary | ICD-10-CM

## 2020-06-23 LAB — POCT INR: INR: 2 (ref 2.0–3.0)

## 2020-06-23 NOTE — Patient Instructions (Signed)
Description   Continue taking same dosage of Warfarin 1.5 tablets daily except for 2 tablets on Mondays.  Recheck INR in 4 weeks. Call with any medication changes or if you are scheduled for surgery [facility] [phone] Main [phone]

## 2020-06-27 ENCOUNTER — Other Ambulatory Visit: Payer: Self-pay | Admitting: Family Medicine

## 2020-06-27 DIAGNOSIS — Z1231 Encounter for screening mammogram for malignant neoplasm of breast: Secondary | ICD-10-CM

## 2020-06-28 ENCOUNTER — Encounter: Payer: Medicare Other | Admitting: Podiatry

## 2020-06-29 ENCOUNTER — Telehealth: Payer: Self-pay | Admitting: Infectious Disease

## 2020-06-29 NOTE — Telephone Encounter (Signed)
[name] called me this afternoon about this patient who reoperated on and obtained a culture from her toe joint which grew Staphylococcus capitis.  He said that the pathology of bone showed no evidence of osteomyelitis but I told him I thought the patient should be treated with a positive culture.  I can see that she is already been scheduled with me for next Wednesday.  She should be on doxycycline

## 2020-06-29 NOTE — Telephone Encounter (Signed)
Patient already scheduled

## 2020-06-30 ENCOUNTER — Ambulatory Visit: Payer: Medicare Other | Admitting: Psychiatry

## 2020-07-04 DIAGNOSIS — M542 Cervicalgia: Secondary | ICD-10-CM | POA: Diagnosis not present

## 2020-07-04 DIAGNOSIS — M791 Myalgia, unspecified site: Secondary | ICD-10-CM | POA: Diagnosis not present

## 2020-07-04 DIAGNOSIS — G518 Other disorders of facial nerve: Secondary | ICD-10-CM | POA: Diagnosis not present

## 2020-07-04 DIAGNOSIS — G43719 Chronic migraine without aura, intractable, without status migrainosus: Secondary | ICD-10-CM | POA: Diagnosis not present

## 2020-07-06 ENCOUNTER — Ambulatory Visit (INDEPENDENT_AMBULATORY_CARE_PROVIDER_SITE_OTHER): Payer: Medicare Other | Admitting: Psychology

## 2020-07-06 ENCOUNTER — Encounter: Payer: Self-pay | Admitting: Infectious Disease

## 2020-07-06 ENCOUNTER — Ambulatory Visit (INDEPENDENT_AMBULATORY_CARE_PROVIDER_SITE_OTHER): Payer: Medicare Other | Admitting: Infectious Disease

## 2020-07-06 ENCOUNTER — Telehealth: Payer: Self-pay

## 2020-07-06 ENCOUNTER — Other Ambulatory Visit: Payer: Self-pay

## 2020-07-06 VITALS — BP 111/69 | HR 62 | Temp 98.0°F | Wt 183.0 lb

## 2020-07-06 DIAGNOSIS — N183 Chronic kidney disease, stage 3 unspecified: Secondary | ICD-10-CM | POA: Diagnosis not present

## 2020-07-06 DIAGNOSIS — T148XXA Other injury of unspecified body region, initial encounter: Secondary | ICD-10-CM | POA: Diagnosis not present

## 2020-07-06 DIAGNOSIS — I480 Paroxysmal atrial fibrillation: Secondary | ICD-10-CM | POA: Diagnosis not present

## 2020-07-06 DIAGNOSIS — B957 Other staphylococcus as the cause of diseases classified elsewhere: Secondary | ICD-10-CM

## 2020-07-06 DIAGNOSIS — A0472 Enterocolitis due to Clostridium difficile, not specified as recurrent: Secondary | ICD-10-CM

## 2020-07-06 DIAGNOSIS — E118 Type 2 diabetes mellitus with unspecified complications: Secondary | ICD-10-CM | POA: Diagnosis not present

## 2020-07-06 DIAGNOSIS — F3181 Bipolar II disorder: Secondary | ICD-10-CM | POA: Diagnosis not present

## 2020-07-06 DIAGNOSIS — Z7901 Long term (current) use of anticoagulants: Secondary | ICD-10-CM

## 2020-07-06 DIAGNOSIS — M009 Pyogenic arthritis, unspecified: Secondary | ICD-10-CM

## 2020-07-06 HISTORY — DX: Pyogenic arthritis, unspecified: M00.9

## 2020-07-06 HISTORY — DX: Other injury of unspecified body region, initial encounter: T14.8XXA

## 2020-07-06 MED ORDER — DOXYCYCLINE HYCLATE 100 MG PO TABS: 100.0000 mg | ORAL_TABLET | Freq: Two times a day (BID) | ORAL | 1 refills | Status: DC

## 2020-07-06 NOTE — Progress Notes (Signed)
Subjective:   Reason for infectious disease consult: Septic arthritis of right hallux IP joint  Requesting Physician: Dr [name]   Patient ID: Meredith [name], female    DOB: 17-Jul-1950, 70 y.o.   MRN: [id number]  HPI  Meredith [name] is a 70 year old Caucasian female with a history of diabetes mellitus mellitus atrial fibrillation on anticoagulation who had history of right forefoot pain and had podiatric surgery to the right hallux including MP joint replacement and IP joint resection arthroplasty.  She was evaluated by 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  Labs including sed rate and CRP were normal.  An MRI was not clear-cut as far as revealing pathology radiology thought this could be gout but also could potentially be osteomyelitis epic arthritis involving the IP joint.  She was taken to the operating room by [name].  In the operating room he found significant erosion of the distal portion of the proximal phalanx.  Bone specimens were taken for pathology and cultures were taken as well.  Hardware has been removed. Biopsy showed benign fibroelastic tissue.  Cultures did yield a Staph capitis was sensitive to essentially all antistaphylococcal antibiotics.  [name] called me about the patient and we decided to initiate doxycycline and to get her into our clinic.  I think that despite the fact that the patient's pathology result results were reassuring that she did not have a bone infection that her inflammatory markers are reassuring that we still nonetheless left with radiographic findings suggestive of osteomyelitis and a positive culture with him taking an in a sterile setting from the operating room in an area where infection was suspected.  I feel strongly that minimum we should do is give her 6 weeks of systemic IV antibiotics.  She has multiple allergies including to penicillins which cause a rash cephalosporins was largely caused  GI upset and do not cause a rash.  Note she did tolerate cefazolin while she was in the hospital preoperatively as well.  She was in the ER with a relative yesterday and now has significant bruising of her right face.  She does not recall striking against anything but does say that she wore the mask the entire time that her relative was being evaluated in the ER and she wonders the pressure than that could have caused the bruising on her face that is visible today.  She really has minimal pain at her operative site.   Past Medical History:  Diagnosis Date  . Anxiety   . Atrial fibrillation ([place])   . Bipolar 2 disorder ([place])   . Depression   . Hematoma 07/06/2020  . History of cardioversion    x2  . Hyperlipidemia   . Hypothyroidism   . Migraine headache   . Osteoporosis   . Septic arthritis of interphalangeal joint of toe of right foot ([place]) 07/06/2020    Past Surgical History:  Procedure Laterality Date  . ABLATION    . BACK SURGERY  2014  . CARDIOVERSION  12/2013, 01/2014   x2  . KNEE SURGERY     ORTHOSCOPIC  . LUNG REMOVAL, PARTIAL Right    BENIGN LUNG CYST  . SYNOVIAL BIOPSY Right 06/16/2020   Procedure: Right hallux interphalangeal joint arthrotomy and biopsy;  Surgeon: [name], MD;  Location: [facility];  Service: Orthopedics;  Laterality: Right;  . THORACIC SPINE SURGERY Right   . THUMB ARTHROSCOPY      Family History  Problem Relation Age of Onset  . Arthritis Mother   . Depression Mother   . Diabetes Mother   . Heart disease Mother   . Stroke Mother   . Early death Father   . Heart disease Father   . Atrial fibrillation Brother   . Hyperlipidemia Brother   . Multiple sclerosis Sister   . Alcohol abuse Brother   . Asthma Brother   . Drug abuse Brother   . Hypertension Brother   . Mental illness Brother       Social History   Socioeconomic History  . Marital status: Single    Spouse name: Not on file  . Number of children: 3  .  Years of education: Not on file  . Highest education level: Not on file  Occupational History  . Not on file  Tobacco Use  . Smoking status: Never Smoker  . Smokeless tobacco: Never Used  Vaping Use  . Vaping Use: Not on file  Substance and Sexual Activity  . Alcohol use: No  . Drug use: No  . Sexual activity: Not on file  Other Topics Concern  . Not on file  Social History Narrative  . Not on file   Social Determinants of Health   Financial Resource Strain: Not on file  Food Insecurity: Not on file  Transportation Needs: Not on file  Physical Activity: Not on file  Stress: Not on file  Social Connections: Not on file    Allergies  Allergen Reactions  . Codeine Anaphylaxis    Patient states she can take codeine but not percocet   . Adhesive [Tape]     blister  . Celebrex [Celecoxib] Hives  . Erythromycin Other (See Comments)  . Other     Other reaction(s): MAKE HER CRAZY Other reaction(s): rash, high fever,welts Other reaction(s): rash Other reaction(s): severe diarrhea/vomiting Other reaction(s): Unknown  . Penicillamine Diarrhea    Other reaction(s): Vomiting  . Sulfa Antibiotics Hives  . Tricyclic Antidepressants     Doesn't help  . Amoxicillin Rash  . Ampicillin Rash  . Cephalexin Diarrhea  . Penicillins Rash    Did it involve swelling of the face/tongue/throat, SOB, or low BP? Yes Did it involve sudden or severe rash/hives, skin peeling, or any reaction on the inside of your mouth or nose? NO Did you need to seek medical attention at a hospital or doctor's office? NO When did it last happen? childhood If all above answers are "NO", may proceed with cephalosporin use.     Current Outpatient Medications:  .  ARIPiprazole (ABILIFY) 30 MG tablet, Take 1 tablet (30 mg total) by mouth daily., Disp: 30 tablet, Rfl: 5 .  Ascorbic Acid (VITAMIN C) 1000 MG tablet, Take 1,000 mg by mouth daily., Disp: , Rfl:  .  aspirin 325 MG tablet, Take 325 mg by  mouth daily as needed for headache. , Disp: , Rfl:  .  baclofen (LIORESAL) 10 MG tablet, Take 1 tablet by mouth as needed., Disp: , Rfl:  .  bisacodyl (DULCOLAX) 5 MG EC tablet, PRN, Disp: , Rfl:  .  Cholecalciferol (VITAMIN D3) 125 MCG (5000 UT) TABS, Take 1 tablet by mouth daily at 12 noon., Disp: , Rfl:  .  clotrimazole (LOTRIMIN) 1 % cream, APPLY CREAM TOPICALLY TO AFFECTED AREA TWICE DAILY, Disp: , Rfl:  .  docusate sodium (COLACE) 100 MG capsule, 1 capsule as needed, Disp: , Rfl:  .  doxycycline (VIBRA-TABS) 100 MG tablet, Take 1 tablet (100  mg total) by mouth 2 (two) times daily., Disp: 60 tablet, Rfl: 1 .  EPINEPHrine 0.3 mg/0.3 mL IJ SOAJ injection, , Disp: , Rfl:  .  estradiol (ESTRACE) 1 MG tablet, Take 1 mg by mouth daily., Disp: , Rfl:  .  ferrous sulfate 325 (65 FE) MG tablet, Take 325 mg by mouth 2 (two) times daily., Disp: , Rfl:  .  fluticasone (FLONASE SENSIMIST) 27.5 MCG/SPRAY nasal spray, Place 1 spray into the nose daily., Disp: , Rfl:  .  Galcanezumab-gnlm (EMGALITY) 120 MG/ML SOAJ, Inject into the skin., Disp: , Rfl:  .  HYDROcodone-acetaminophen (NORCO) 7.5-325 MG tablet, Take 1 tablet by mouth every 8 (eight) hours as needed for moderate pain., Disp: , Rfl:  .  ipratropium (ATROVENT) 0.03 % nasal spray, 1-2 sprays in each nostril, Disp: , Rfl:  .  lamoTRIgine (LAMICTAL) 100 MG tablet, Take 1 tablet (100 mg total) by mouth 2 (two) times daily., Disp: 60 tablet, Rfl: 5 .  levocetirizine (XYZAL) 5 MG tablet, Take 5 mg by mouth every evening., Disp: , Rfl:  .  levothyroxine (SYNTHROID) 112 MCG tablet, Take 112 mcg by mouth daily before breakfast., Disp: , Rfl:  .  lithium carbonate 150 MG capsule, Take 1 capsule (150 mg total) by mouth daily. (Patient taking differently: Take 150 mg by mouth. Takes only on Monday,Wednesday,Friday.), Disp: 90 capsule, Rfl: 0 .  lithium carbonate 300 MG capsule, Take 1 capsule (300 mg total) by mouth at bedtime., Disp: 90 capsule, Rfl: 1 .   loperamide (IMODIUM) 2 MG capsule, Take 1 capsule (2 mg total) by mouth as needed for diarrhea or loose stools., Disp: 30 capsule, Rfl: 0 .  Melatonin 5 MG TABS, Take 20 mg by mouth at bedtime., Disp: , Rfl:  .  metoprolol succinate (TOPROL-XL) 50 MG 24 hr tablet, Take 1 tablet (50 mg total) by mouth daily. Take with or immediately following a meal., Disp: 90 tablet, Rfl: 3 .  naloxone (NARCAN) 0.4 MG/ML injection, , Disp: , Rfl:  .  ondansetron (ZOFRAN) 4 MG tablet, Take 1 tablet (4 mg total) by mouth every 8 (eight) hours as needed for nausea or vomiting., Disp: 20 tablet, Rfl: 0 .  pantoprazole (PROTONIX) 40 MG tablet, Take 1 tablet (40 mg total) by mouth daily., Disp: 90 tablet, Rfl: 0 .  polyethylene glycol-electrolytes (NULYTELY) 420 g solution, , Disp: , Rfl:  .  pravastatin (PRAVACHOL) 20 MG tablet, Take 20 mg by mouth daily., Disp: , Rfl: 3 .  pregabalin (LYRICA) 150 MG capsule, Take 150 mg by mouth 3 (three) times daily., Disp: , Rfl:  .  QUEtiapine (SEROQUEL) 200 MG tablet, TAKE 1 TABLET BY MOUTH AT BEDTIME, Disp: 90 tablet, Rfl: 0 .  senna (SENOKOT) 8.6 MG TABS tablet, Take 2 capsules by mouth every other day., Disp: , Rfl:  .  senna-docusate (PERI-COLACE) 8.6-50 MG tablet, Take 2 tablets by mouth 2 (two) times daily. For constipation, Disp: , Rfl:  .  verapamil (VERELAN PM) 120 MG 24 hr capsule, Take 1 capsule (120 mg total) by mouth at bedtime. For high blood pressure, Disp: , Rfl:  .  warfarin (COUMADIN) 2.5 MG tablet, Take 1.5 - 2 tablets once daily or AS DIRECTED BY COUMADIN CLINIC, Disp: 150 tablet, Rfl: 0 .  doxycycline (ADOXA) 100 MG tablet, Take 100 mg by mouth 2 (two) times daily., Disp: , Rfl:  .  Erenumab-aooe (AIMOVIG) 140 MG/ML SOAJ, Inject 140 mg into the skin every 30 (thirty) days. (Patient  not taking: No sig reported), Disp: , Rfl:    Review of Systems  Constitutional: Negative for activity change, appetite change, chills, diaphoresis, fatigue, fever and unexpected  weight change.  HENT: Negative for congestion, rhinorrhea, sinus pressure, sneezing, sore throat and trouble swallowing.   Eyes: Negative for photophobia and visual disturbance.  Respiratory: Negative for cough, chest tightness, shortness of breath, wheezing and stridor.   Cardiovascular: Negative for chest pain, palpitations and leg swelling.  Gastrointestinal: Negative for abdominal distention, abdominal pain, anal bleeding, blood in stool, constipation, diarrhea, nausea and vomiting.  Genitourinary: Negative for difficulty urinating, dysuria, flank pain and hematuria.  Musculoskeletal: Negative for arthralgias, back pain, gait problem, joint swelling and myalgias.  Skin: Negative for color change, pallor, rash and wound.  Neurological: Negative for dizziness, tremors, weakness and light-headedness.  Hematological: Negative for adenopathy. Bruises/bleeds easily.  Psychiatric/Behavioral: Negative for agitation, behavioral problems, confusion, decreased concentration, dysphoric mood and sleep disturbance.       Objective:   Physical Exam Constitutional:      General: She is not in acute distress.    Appearance: She is not diaphoretic.  HENT:     Head: Normocephalic and atraumatic.     Right Ear: External ear normal.     Left Ear: External ear normal.     Nose: Nose normal.     Mouth/Throat:     Pharynx: No oropharyngeal exudate.  Eyes:     General: No scleral icterus.    Conjunctiva/sclera: Conjunctivae normal.     Pupils: Pupils are equal, round, and reactive to light.  Cardiovascular:     Rate and Rhythm: Normal rate. Rhythm irregular.     Heart sounds: Normal heart sounds. No murmur heard. No friction rub. No gallop.   Pulmonary:     Effort: Pulmonary effort is normal. No respiratory distress.     Breath sounds: Normal breath sounds. No wheezing or rales.  Abdominal:     General: Bowel sounds are normal. There is no distension.     Palpations: Abdomen is soft.      Tenderness: There is no abdominal tenderness. There is no rebound.  Musculoskeletal:        General: No tenderness. Normal range of motion.     Cervical back: Normal range of motion and neck supple.  Lymphadenopathy:     Cervical: No cervical adenopathy.  Skin:    General: Skin is warm and dry.     Coloration: Skin is not pale.     Findings: No erythema or rash.  Neurological:     General: No focal deficit present.     Mental Status: She is alert and oriented to person, place, and time.     Coordination: Coordination normal.  Psychiatric:        Mood and Affect: Mood normal.        Behavior: Behavior normal.        Thought Content: Thought content normal.        Judgment: Judgment normal.    Bruising on right side of face see picture Jul 06, 2020:     Right foot Jul 06, 2020:        Assessment & Plan:   Septic arthritis of right hallux IP joint where there is also radiographic suggestion of osteomyelitis:  I have placed order for PICC line and also for IV cefazolin x6 weeks.  Facial bruise on anticoagulation: Should continue to monitor the site.  Atrial fibrillation on rate control and anticoagulation.  Given  that we are changing antibiotics yet again her Coumadin clinic will need to be aware of this and to adjust her dosage of Coumadin.  Multiple allergies: Have reviewed these with her.  I have updated the full [place] allergy which is more GI intolerance than anything else.  History of C. difficile colitis: I have counseled her against using drugs such as clindamycin and fluoroquinolones in particular.   I spent greater than 80 minutes with the patient including greater than 50% of time in face to face counsel of the patient the nature of septic arthritis and osteomyelitis, diabetic foot infections anticoagulation and interactions between antibiotics and warfarin, reviewing her radiographic studies personally her laboratory data and in coordination of her care with  radiology home health and Dr Doran Durand.

## 2020-07-06 NOTE — Telephone Encounter (Addendum)
I spoke with Anderson Malta with IR and she has scheduled patient for picc placement on 07/14/20 and arrival time 11:30 am. Anderson Malta informed patient will not need first dose at short stay. Patient has has cefazolin in the past per Dr. Marylou Mccoy verbalized understanding.  I have spoke to [facility] with [facility] to let them know we will be faxing orders to them for the patient. Debbie informed of patient picc placement appointment and patient will not be getting first dose at short stay.  Debbie verbalized understanding.   I have attempted to contact the patient to give her the appointment information. Patient did not answer and no secured voicemail setup.  Minoru Chap T Brooks Sailors

## 2020-07-07 LAB — BASIC METABOLIC PANEL WITH GFR
BUN/Creatinine Ratio: 21 (calc) (ref 6–22)
BUN: 22 mg/dL (ref 7–25)
CO2: 31 mmol/L (ref 20–32)
Calcium: 9.7 mg/dL (ref 8.6–10.4)
Chloride: 104 mmol/L (ref 98–110)
Creat: 1.07 mg/dL — ABNORMAL HIGH (ref 0.60–0.93)
GFR, Est African American: 61 mL/min/{1.73_m2} (ref >=60)
GFR, Est Non African American: 53 mL/min/{1.73_m2} — ABNORMAL LOW (ref >=60)
Glucose, Bld: 94 mg/dL (ref 65–99)
Potassium: 4.1 mmol/L (ref 3.5–5.3)
Sodium: 143 mmol/L (ref 135–146)

## 2020-07-07 LAB — CBC WITH DIFFERENTIAL/PLATELET
Absolute Monocytes: 614 cells/uL (ref 200–950)
Basophils Absolute: 56 cells/uL (ref 0–200)
Basophils Relative: 0.6 %
Eosinophils Absolute: 223 cells/uL (ref 15–500)
Eosinophils Relative: 2.4 %
HCT: 37.1 % (ref 35.0–45.0)
Hemoglobin: 12.4 g/dL (ref 11.7–15.5)
Lymphs Abs: 1739 cells/uL (ref 850–3900)
MCH: 30.6 pg (ref 27.0–33.0)
MCHC: 33.4 g/dL (ref 32.0–36.0)
MCV: 91.6 fL (ref 80.0–100.0)
MPV: 10.8 fL (ref 7.5–12.5)
Monocytes Relative: 6.6 %
Neutro Abs: 6668 cells/uL (ref 1500–7800)
Neutrophils Relative %: 71.7 %
Platelets: 238 10*3/uL (ref 140–400)
RBC: 4.05 10*6/uL (ref 3.80–5.10)
RDW: 12.8 % (ref 11.0–15.0)
Total Lymphocyte: 18.7 %
WBC: 9.3 10*3/uL (ref 3.8–10.8)

## 2020-07-07 LAB — SEDIMENTATION RATE: Sed Rate: 11 mm/h (ref 0–30)

## 2020-07-07 LAB — C-REACTIVE PROTEIN: CRP: 4.1 mg/L (ref <8.0)

## 2020-07-07 NOTE — Telephone Encounter (Signed)
Patient left voicemail requesting copay information. States if cost is too much she would prefer to try oral antibiotic.  Spoke with Jackelyn Poling at [facility] who is still waiting on this information. Will call office once this is available.  Leatrice Jewels, RMA

## 2020-07-08 NOTE — Telephone Encounter (Signed)
Pt is okay with copay cost ($13). Will keep appt for picc line as planned. Leatrice Jewels, RMA

## 2020-07-11 ENCOUNTER — Ambulatory Visit (INDEPENDENT_AMBULATORY_CARE_PROVIDER_SITE_OTHER): Payer: Medicare Other | Admitting: Infectious Disease

## 2020-07-11 ENCOUNTER — Other Ambulatory Visit: Payer: Self-pay

## 2020-07-11 ENCOUNTER — Telehealth: Payer: Self-pay

## 2020-07-11 ENCOUNTER — Encounter: Payer: Self-pay | Admitting: Infectious Disease

## 2020-07-11 DIAGNOSIS — A0472 Enterocolitis due to Clostridium difficile, not specified as recurrent: Secondary | ICD-10-CM | POA: Diagnosis not present

## 2020-07-11 DIAGNOSIS — Z7901 Long term (current) use of anticoagulants: Secondary | ICD-10-CM

## 2020-07-11 DIAGNOSIS — M009 Pyogenic arthritis, unspecified: Secondary | ICD-10-CM | POA: Diagnosis not present

## 2020-07-11 DIAGNOSIS — M2032 Hallux varus (acquired), left foot: Secondary | ICD-10-CM

## 2020-07-11 MED ORDER — DOXYCYCLINE HYCLATE 100 MG PO TABS: 100.0000 mg | ORAL_TABLET | Freq: Two times a day (BID) | ORAL | 5 refills | Status: DC

## 2020-07-11 NOTE — Telephone Encounter (Signed)
Received the following message from Meredith [name] with [facility]. Good morning. Meredith [name] called our team this a.m. to advise she has changed her mind and does not want to pursue PICC/home IV ABX at this time. She is calling your office as well to get further instructions. We will follow and support if needed as you direct.   Thank you.  Meredith [name]~  Scheduled appointment for today at 24 with Dr. Tommy Medal. Spoke with patient regarding message. States she is not comfortable with doing IV antibiotics.  Is also having concerns affording treatment. Meredith [name], RMA

## 2020-07-11 NOTE — Telephone Encounter (Signed)
Call report sent to Dr Megan Salon

## 2020-07-11 NOTE — Progress Notes (Signed)
Virtual Visit via Telephone Note  I connected with Meredith [name] on 07/11/20 at 11:00 AM EDT by telephone and verified that I am speaking with the correct person using two identifiers.  Location: Patient: Home Provider: [facility]   I discussed the limitations, risks, security and privacy concerns of performing an evaluation and management service by telephone and the availability of in person appointments. I also discussed with the patient that there may be a patient responsible charge related to this service. The patient expressed understanding and agreed to proceed.   History of Present Illness:  Meredith [name] is a 70 year old Caucasian female that I saw in the clinic last week.  She has a history of diabetes mellitus mellitus atrial fibrillation on anticoagulation who had history of right forefoot pain and had podiatric surgery to the right hallux including MP joint replacement and IP joint resection arthroplasty.  She was evaluated by 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  Labs including sed rate and CRP were normal.  An MRI was not clear-cut as far as revealing pathology radiology thought this could be gout but also could potentially be osteomyelitis epic arthritis involving the IP joint.  She was taken to the operating room by [name].  In the operating room he found significant erosion of the distal portion of the proximal phalanx.  Bone specimens were taken for pathology and cultures were taken as well.  Hardware has been removed. Biopsy showed benign fibroelastic tissue.  Cultures did yield a Staph capitis was sensitive to essentially all antistaphylococcal antibiotics.  [name] called me about the patient and we decided to initiate doxycycline and to get her into our clinic.  I think that despite the fact that the patient's pathology result results were reassuring that she did not have a bone infection that her inflammatory markers  are reassuring that we still nonetheless left with radiographic findings suggestive of osteomyelitis and a positive culture with him taking an in a sterile setting from the operating room in an area where infection was suspected.  I felt strongly that minimum we should do is give her 6 weeks of systemic IV antibiotics.  However since our visit she has developed several concerns about the IV antibiotics.  1.  She is concerned about the cost and was told that it would be $600 out-of-pocket for the 6 weeks of treatment.  She then mentions something about being charged for a year and that it would be less but I really did not understand what she was saying.  2.  She is concerned about the PICC line getting infected which is certainly a legitimate concern.  3.  We talked about blood clots though she is on anticoagulation with Coumadin.  4.  She is concerned about being able to administer IV antibiotics to herself without help which I think she could do and most of our patients are capable of doing.    #5 she is concerned about being able to take a shower and being able to wrap the PICC line sufficiently to take sleep safely take a shower.    The end of our conversation we ultimately decided to simply stick with oral doxycycline for now and reevaluate her when she comes back to see me in roughly a month's time.    Past Medical History:  Diagnosis Date  . Anxiety   . Atrial fibrillation ([place])   . Bipolar 2 disorder ([place])   . Depression   .  Hematoma 07/06/2020  . History of cardioversion    x2  . Hyperlipidemia   . Hypothyroidism   . Migraine headache   . Osteoporosis   . Septic arthritis of interphalangeal joint of toe of right foot ([place]) 07/06/2020    Past Surgical History:  Procedure Laterality Date  . ABLATION    . BACK SURGERY  2014  . CARDIOVERSION  12/2013, 01/2014   x2  . KNEE SURGERY     ORTHOSCOPIC  . LUNG REMOVAL, PARTIAL Right    BENIGN LUNG CYST  . SYNOVIAL BIOPSY  Right 06/16/2020   Procedure: Right hallux interphalangeal joint arthrotomy and biopsy;  Surgeon: [name], MD;  Location: [facility];  Service: Orthopedics;  Laterality: Right;  . THORACIC SPINE SURGERY Right   . THUMB ARTHROSCOPY      Family History  Problem Relation Age of Onset  . Arthritis Mother   . Depression Mother   . Diabetes Mother   . Heart disease Mother   . Stroke Mother   . Early death Father   . Heart disease Father   . Atrial fibrillation Brother   . Hyperlipidemia Brother   . Multiple sclerosis Sister   . Alcohol abuse Brother   . Asthma Brother   . Drug abuse Brother   . Hypertension Brother   . Mental illness Brother       Social History   Socioeconomic History  . Marital status: Single    Spouse name: Not on file  . Number of children: 3  . Years of education: Not on file  . Highest education level: Not on file  Occupational History  . Not on file  Tobacco Use  . Smoking status: Never Smoker  . Smokeless tobacco: Never Used  Vaping Use  . Vaping Use: Not on file  Substance and Sexual Activity  . Alcohol use: No  . Drug use: No  . Sexual activity: Not on file  Other Topics Concern  . Not on file  Social History Narrative  . Not on file   Social Determinants of Health   Financial Resource Strain: Not on file  Food Insecurity: Not on file  Transportation Needs: Not on file  Physical Activity: Not on file  Stress: Not on file  Social Connections: Not on file    Allergies  Allergen Reactions  . Codeine Anaphylaxis    Patient states she can take codeine but not percocet   . Adhesive [Tape]     blister  . Celebrex [Celecoxib] Hives  . Erythromycin Other (See Comments)  . Other     Other reaction(s): MAKE HER CRAZY Other reaction(s): rash, high fever,welts Other reaction(s): rash Other reaction(s): severe diarrhea/vomiting Other reaction(s): Unknown  . Penicillamine Diarrhea    Other reaction(s): Vomiting  .  Sulfa Antibiotics Hives  . Tricyclic Antidepressants     Doesn't help  . Amoxicillin Rash  . Ampicillin Rash  . Cephalexin Diarrhea  . Penicillins Rash    Did it involve swelling of the face/tongue/throat, SOB, or low BP? Yes Did it involve sudden or severe rash/hives, skin peeling, or any reaction on the inside of your mouth or nose? NO Did you need to seek medical attention at a hospital or doctor's office? NO When did it last happen? childhood If all above answers are "NO", may proceed with cephalosporin use.     Current Outpatient Medications:  .  ARIPiprazole (ABILIFY) 30 MG tablet, Take 1 tablet (30 mg total) by mouth daily.,  Disp: 30 tablet, Rfl: 5 .  Ascorbic Acid (VITAMIN C) 1000 MG tablet, Take 1,000 mg by mouth daily., Disp: , Rfl:  .  aspirin 325 MG tablet, Take 325 mg by mouth daily as needed for headache. , Disp: , Rfl:  .  baclofen (LIORESAL) 10 MG tablet, Take 1 tablet by mouth as needed., Disp: , Rfl:  .  bisacodyl (DULCOLAX) 5 MG EC tablet, PRN, Disp: , Rfl:  .  Cholecalciferol (VITAMIN D3) 125 MCG (5000 UT) TABS, Take 1 tablet by mouth daily at 12 noon., Disp: , Rfl:  .  clotrimazole (LOTRIMIN) 1 % cream, APPLY CREAM TOPICALLY TO AFFECTED AREA TWICE DAILY, Disp: , Rfl:  .  docusate sodium (COLACE) 100 MG capsule, 1 capsule as needed, Disp: , Rfl:  .  doxycycline (VIBRA-TABS) 100 MG tablet, Take 1 tablet (100 mg total) by mouth 2 (two) times daily., Disp: 60 tablet, Rfl: 5 .  EPINEPHrine 0.3 mg/0.3 mL IJ SOAJ injection, , Disp: , Rfl:  .  Erenumab-aooe (AIMOVIG) 140 MG/ML SOAJ, Inject 140 mg into the skin every 30 (thirty) days. (Patient not taking: No sig reported), Disp: , Rfl:  .  estradiol (ESTRACE) 1 MG tablet, Take 1 mg by mouth daily., Disp: , Rfl:  .  ferrous sulfate 325 (65 FE) MG tablet, Take 325 mg by mouth 2 (two) times daily., Disp: , Rfl:  .  fluticasone (FLONASE SENSIMIST) 27.5 MCG/SPRAY nasal spray, Place 1 spray into the nose daily., Disp: , Rfl:   .  Galcanezumab-gnlm (EMGALITY) 120 MG/ML SOAJ, Inject into the skin., Disp: , Rfl:  .  HYDROcodone-acetaminophen (NORCO) 7.5-325 MG tablet, Take 1 tablet by mouth every 8 (eight) hours as needed for moderate pain., Disp: , Rfl:  .  ipratropium (ATROVENT) 0.03 % nasal spray, 1-2 sprays in each nostril, Disp: , Rfl:  .  lamoTRIgine (LAMICTAL) 100 MG tablet, Take 1 tablet (100 mg total) by mouth 2 (two) times daily., Disp: 60 tablet, Rfl: 5 .  levocetirizine (XYZAL) 5 MG tablet, Take 5 mg by mouth every evening., Disp: , Rfl:  .  levothyroxine (SYNTHROID) 112 MCG tablet, Take 112 mcg by mouth daily before breakfast., Disp: , Rfl:  .  lithium carbonate 150 MG capsule, Take 1 capsule (150 mg total) by mouth daily. (Patient taking differently: Take 150 mg by mouth. Takes only on Monday,Wednesday,Friday.), Disp: 90 capsule, Rfl: 0 .  lithium carbonate 300 MG capsule, Take 1 capsule (300 mg total) by mouth at bedtime., Disp: 90 capsule, Rfl: 1 .  loperamide (IMODIUM) 2 MG capsule, Take 1 capsule (2 mg total) by mouth as needed for diarrhea or loose stools., Disp: 30 capsule, Rfl: 0 .  Melatonin 5 MG TABS, Take 20 mg by mouth at bedtime., Disp: , Rfl:  .  metoprolol succinate (TOPROL-XL) 50 MG 24 hr tablet, Take 1 tablet (50 mg total) by mouth daily. Take with or immediately following a meal., Disp: 90 tablet, Rfl: 3 .  naloxone (NARCAN) 0.4 MG/ML injection, , Disp: , Rfl:  .  ondansetron (ZOFRAN) 4 MG tablet, Take 1 tablet (4 mg total) by mouth every 8 (eight) hours as needed for nausea or vomiting., Disp: 20 tablet, Rfl: 0 .  pantoprazole (PROTONIX) 40 MG tablet, Take 1 tablet (40 mg total) by mouth daily., Disp: 90 tablet, Rfl: 0 .  polyethylene glycol-electrolytes (NULYTELY) 420 g solution, , Disp: , Rfl:  .  pravastatin (PRAVACHOL) 20 MG tablet, Take 20 mg by mouth daily., Disp: , Rfl: 3 .  pregabalin (LYRICA) 150 MG capsule, Take 150 mg by mouth 3 (three) times daily., Disp: , Rfl:  .  QUEtiapine  (SEROQUEL) 200 MG tablet, TAKE 1 TABLET BY MOUTH AT BEDTIME, Disp: 90 tablet, Rfl: 0 .  senna (SENOKOT) 8.6 MG TABS tablet, Take 2 capsules by mouth every other day., Disp: , Rfl:  .  senna-docusate (PERI-COLACE) 8.6-50 MG tablet, Take 2 tablets by mouth 2 (two) times daily. For constipation, Disp: , Rfl:  .  verapamil (VERELAN PM) 120 MG 24 hr capsule, Take 1 capsule (120 mg total) by mouth at bedtime. For high blood pressure, Disp: , Rfl:  .  warfarin (COUMADIN) 2.5 MG tablet, Take 1.5 - 2 tablets once daily or AS DIRECTED BY COUMADIN CLINIC, Disp: 150 tablet, Rfl: 0     Observations/Objective:  Meredith [name] appeared to be in no acute distress over the phone  Assessment and Plan:  Septic arthritis of right hallux IP joint where there is also radiographic suggestion of osteomyelitis:  We will as well as plans for PICC line and home IV antibiotics and continue with doxycycline for now and reevaluate her.  If she is making good progress with the oral antibiotics we can then switch to IV antibiotics at that point in time as I suspect she would then be more motivated to proceed with IV antibiotics.   On long-term anticoagulation: Recent bruising on her face will update her cardiologist with regards to the fact that she has not gone to be on Keflex but on doxycycline.  Follow Up Instructions:    I discussed the assessment and treatment plan with the patient. The patient was provided an opportunity to ask questions and all were answered. The patient agreed with the plan and demonstrated an understanding of the instructions.   The patient was advised to call back or seek an in-person evaluation if the symptoms worsen or if the condition fails to improve as anticipated.  I provided  22  minutes of non-face-to-face time during this encounter.   Alcide Evener, MD

## 2020-07-12 ENCOUNTER — Encounter: Payer: Medicare Other | Admitting: Podiatry

## 2020-07-14 ENCOUNTER — Other Ambulatory Visit (HOSPITAL_COMMUNITY): Payer: Medicare Other

## 2020-07-14 DIAGNOSIS — G894 Chronic pain syndrome: Secondary | ICD-10-CM | POA: Diagnosis not present

## 2020-07-14 DIAGNOSIS — M47816 Spondylosis without myelopathy or radiculopathy, lumbar region: Secondary | ICD-10-CM | POA: Diagnosis not present

## 2020-07-14 DIAGNOSIS — M79673 Pain in unspecified foot: Secondary | ICD-10-CM | POA: Diagnosis not present

## 2020-07-14 DIAGNOSIS — M961 Postlaminectomy syndrome, not elsewhere classified: Secondary | ICD-10-CM | POA: Diagnosis not present

## 2020-07-20 ENCOUNTER — Ambulatory Visit (INDEPENDENT_AMBULATORY_CARE_PROVIDER_SITE_OTHER): Payer: Medicare Other | Admitting: Psychology

## 2020-07-20 DIAGNOSIS — F3181 Bipolar II disorder: Secondary | ICD-10-CM

## 2020-07-21 ENCOUNTER — Ambulatory Visit (INDEPENDENT_AMBULATORY_CARE_PROVIDER_SITE_OTHER): Payer: Medicare Other

## 2020-07-21 ENCOUNTER — Other Ambulatory Visit: Payer: Self-pay

## 2020-07-21 DIAGNOSIS — I480 Paroxysmal atrial fibrillation: Secondary | ICD-10-CM | POA: Diagnosis not present

## 2020-07-21 DIAGNOSIS — Z5181 Encounter for therapeutic drug level monitoring: Secondary | ICD-10-CM | POA: Diagnosis not present

## 2020-07-21 LAB — POCT INR: INR: 3.5 — AB (ref 2.0–3.0)

## 2020-07-21 NOTE — Patient Instructions (Signed)
-   since you've already taken your warfarin today, have a serving of greens tonight - tomorrow, only take 1/2 tablet warfarin, then - since you have 3 more weeks, of doxycline, please start taking dosage of Warfarin 1.5 tablets every day - Recheck INR in 2 weeks.  Call with any medication changes or if you are scheduled for surgery [facility] [phone] Main [phone]

## 2020-08-02 DIAGNOSIS — M791 Myalgia, unspecified site: Secondary | ICD-10-CM | POA: Diagnosis not present

## 2020-08-02 DIAGNOSIS — G518 Other disorders of facial nerve: Secondary | ICD-10-CM | POA: Diagnosis not present

## 2020-08-02 DIAGNOSIS — M542 Cervicalgia: Secondary | ICD-10-CM | POA: Diagnosis not present

## 2020-08-02 DIAGNOSIS — G43719 Chronic migraine without aura, intractable, without status migrainosus: Secondary | ICD-10-CM | POA: Diagnosis not present

## 2020-08-03 ENCOUNTER — Ambulatory Visit (INDEPENDENT_AMBULATORY_CARE_PROVIDER_SITE_OTHER): Payer: Medicare Other | Admitting: Psychology

## 2020-08-03 DIAGNOSIS — F3181 Bipolar II disorder: Secondary | ICD-10-CM

## 2020-08-04 ENCOUNTER — Ambulatory Visit (INDEPENDENT_AMBULATORY_CARE_PROVIDER_SITE_OTHER): Payer: Medicare Other | Admitting: *Deleted

## 2020-08-04 ENCOUNTER — Other Ambulatory Visit: Payer: Self-pay

## 2020-08-04 DIAGNOSIS — Z5181 Encounter for therapeutic drug level monitoring: Secondary | ICD-10-CM | POA: Diagnosis not present

## 2020-08-04 DIAGNOSIS — I480 Paroxysmal atrial fibrillation: Secondary | ICD-10-CM | POA: Diagnosis not present

## 2020-08-04 LAB — POCT INR: INR: 2.6 (ref 2.0–3.0)

## 2020-08-04 NOTE — Patient Instructions (Addendum)
Description   Continue taking Warfarin 1.5 tablets everyday (dose while still on doxycycline). Recheck INR in 2 weeks.  Call with any medication changes or if you are scheduled for surgery [facility] [phone] Main [phone]

## 2020-08-05 ENCOUNTER — Other Ambulatory Visit: Payer: Self-pay

## 2020-08-05 ENCOUNTER — Ambulatory Visit (INDEPENDENT_AMBULATORY_CARE_PROVIDER_SITE_OTHER): Payer: Medicare Other | Admitting: Infectious Disease

## 2020-08-05 VITALS — BP 142/78 | HR 65 | Resp 16 | Ht 60.0 in | Wt 179.1 lb

## 2020-08-05 DIAGNOSIS — Z7901 Long term (current) use of anticoagulants: Secondary | ICD-10-CM | POA: Diagnosis not present

## 2020-08-05 DIAGNOSIS — I480 Paroxysmal atrial fibrillation: Secondary | ICD-10-CM | POA: Diagnosis not present

## 2020-08-05 DIAGNOSIS — A0472 Enterocolitis due to Clostridium difficile, not specified as recurrent: Secondary | ICD-10-CM | POA: Diagnosis not present

## 2020-08-05 DIAGNOSIS — M009 Pyogenic arthritis, unspecified: Secondary | ICD-10-CM

## 2020-08-05 NOTE — Progress Notes (Signed)
Subjective:  Chief complaint: Follow-up for osteomyelitis associate with hardware and toe  Patient ID: Meredith [name], female    DOB: 1951/01/18, 70 y.o.   MRN: [id number]  HPI  Meredith [name] is a 70 year old Caucasian female that I saw in the clinic last week.   She has a history of diabetes mellitus mellitus atrial fibrillation on anticoagulation who had history of right forefoot pain and had podiatric surgery to the right hallux including MP joint replacement and IP joint resection arthroplasty.   She was evaluated by 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  Labs including sed rate and CRP were normal.  An MRI was not clear-cut as far as revealing pathology radiology thought this could be gout but also could potentially be osteomyelitis epic arthritis involving the IP joint.   She was taken to the operating room by [name].  In the operating room he found significant erosion of the distal portion of the proximal phalanx.  Bone specimens were taken for pathology and cultures were taken as well.   Hardware has been removed. Biopsy showed benign fibroelastic tissue.   Cultures did yield a Staph capitis was sensitive to essentially all antistaphylococcal antibiotics.   [name] called me about the patient and we decided to initiate doxycycline and to get her into our clinic.   I think that despite the fact that the patient's pathology result results were reassuring that she did not have a bone infection that her inflammatory markers are reassuring that we still nonetheless left with radiographic findings suggestive of osteomyelitis and a positive culture with him taking an in a sterile setting from the operating room in an area where infection was suspected.   I felt strongly that minimum we should do is give her 6 weeks of systemic IV antibiotics.   However since our visit she has developed several concerns about the IV antibiotics.   1.  She is  concerned about the cost and was told that it would be $600 out-of-pocket for the 6 weeks of treatment.   She then mentions something about being charged for a year and that it would be less but I really did not understand what she was saying.   2.  She is concerned about the PICC line getting infected which is certainly a legitimate concern.   3.  We talked about blood clots though she is on anticoagulation with Coumadin.   4.  She is concerned about being able to administer IV antibiotics to herself without help which I think she could do and most of our patients are capable of doing.     #5 she is concerned about being able to take a shower and being able to wrap the PICC line sufficiently to take sleep safely take a shower.     The end of our conversation we ultimately decided to simply stick with oral doxycycline.  She is continued on this and seen [name] for follow-up.  She continues to do clinically well.  Of note she never had pain as a presenting symptom of her infection.  Instead she had change in the alignment of her toes and the podiatrist then ordered an MRI.  I would like to get her through 8 weeks of therapy and reassess her.  I have scheduled her to see me on July 8 and we will reassess her at that visit if she is doing well stop her antibiotics and then observe her  off them for several months.    Past Medical History:  Diagnosis Date   Anxiety    Atrial fibrillation ([place])    Bipolar 2 disorder ([place])    Depression    Hematoma 07/06/2020   History of cardioversion    x2   Hyperlipidemia    Hypothyroidism    Migraine headache    Osteoporosis    Septic arthritis of interphalangeal joint of toe of right foot ([place]) 07/06/2020    Past Surgical History:  Procedure Laterality Date   ABLATION     BACK SURGERY  2014   CARDIOVERSION  12/2013, 01/2014   x2   KNEE SURGERY     ORTHOSCOPIC   LUNG REMOVAL, PARTIAL Right    BENIGN LUNG CYST   SYNOVIAL BIOPSY Right  06/16/2020   Procedure: Right hallux interphalangeal joint arthrotomy and biopsy;  Surgeon: [name], MD;  Location: [facility];  Service: Orthopedics;  Laterality: Right;   THORACIC SPINE SURGERY Right    THUMB ARTHROSCOPY      Family History  Problem Relation Age of Onset   Arthritis Mother    Depression Mother    Diabetes Mother    Heart disease Mother    Stroke Mother    Early death Father    Heart disease Father    Atrial fibrillation Brother    Hyperlipidemia Brother    Multiple sclerosis Sister    Alcohol abuse Brother    Asthma Brother    Drug abuse Brother    Hypertension Brother    Mental illness Brother       Social History   Socioeconomic History   Marital status: Single    Spouse name: Not on file   Number of children: 3   Years of education: Not on file   Highest education level: Not on file  Occupational History   Not on file  Tobacco Use   Smoking status: Never   Smokeless tobacco: Never  Vaping Use   Vaping Use: Not on file  Substance and Sexual Activity   Alcohol use: No   Drug use: No   Sexual activity: Not on file  Other Topics Concern   Not on file  Social History Narrative   Not on file   Social Determinants of Health   Financial Resource Strain: Not on file  Food Insecurity: Not on file  Transportation Needs: Not on file  Physical Activity: Not on file  Stress: Not on file  Social Connections: Not on file    Allergies  Allergen Reactions   Codeine Anaphylaxis    Patient states she can take codeine but not percocet    Adhesive [Tape]     blister   Celebrex [Celecoxib] Hives   Erythromycin Other (See Comments)   Other     Other reaction(s): MAKE HER CRAZY Other reaction(s): rash, high fever,welts Other reaction(s): rash Other reaction(s): severe diarrhea/vomiting Other reaction(s): Unknown   Penicillamine Diarrhea    Other reaction(s): Vomiting   Sulfa Antibiotics Hives   Tricyclic Antidepressants      Doesn't help   Amoxicillin Rash   Ampicillin Rash   Cephalexin Diarrhea   Penicillins Rash    Did it involve swelling of the face/tongue/throat, SOB, or low BP? Yes Did it involve sudden or severe rash/hives, skin peeling, or any reaction on the inside of your mouth or nose? NO Did you need to seek medical attention at a hospital or doctor's office? NO When did it last happen? childhood  If all above answers are "NO", may proceed with cephalosporin use.     Current Outpatient Medications:    ARIPiprazole (ABILIFY) 30 MG tablet, Take 1 tablet (30 mg total) by mouth daily., Disp: 30 tablet, Rfl: 5   Ascorbic Acid (VITAMIN C) 1000 MG tablet, Take 1,000 mg by mouth daily., Disp: , Rfl:    aspirin 325 MG tablet, Take 325 mg by mouth daily as needed for headache. , Disp: , Rfl:    baclofen (LIORESAL) 10 MG tablet, Take 1 tablet by mouth as needed., Disp: , Rfl:    bisacodyl (DULCOLAX) 5 MG EC tablet, PRN, Disp: , Rfl:    Cholecalciferol (VITAMIN D3) 125 MCG (5000 UT) TABS, Take 1 tablet by mouth daily at 12 noon., Disp: , Rfl:    clotrimazole (LOTRIMIN) 1 % cream, APPLY CREAM TOPICALLY TO AFFECTED AREA TWICE DAILY, Disp: , Rfl:    docusate sodium (COLACE) 100 MG capsule, 1 capsule as needed, Disp: , Rfl:    doxycycline (VIBRA-TABS) 100 MG tablet, Take 1 tablet (100 mg total) by mouth 2 (two) times daily., Disp: 60 tablet, Rfl: 5   EPINEPHrine 0.3 mg/0.3 mL IJ SOAJ injection, , Disp: , Rfl:    Erenumab-aooe (AIMOVIG) 140 MG/ML SOAJ, Inject 140 mg into the skin every 30 (thirty) days., Disp: , Rfl:    estradiol (ESTRACE) 1 MG tablet, Take 1 mg by mouth daily., Disp: , Rfl:    ferrous sulfate 325 (65 FE) MG tablet, Take 325 mg by mouth 2 (two) times daily., Disp: , Rfl:    fluticasone (FLONASE SENSIMIST) 27.5 MCG/SPRAY nasal spray, Place 1 spray into the nose daily., Disp: , Rfl:    Galcanezumab-gnlm (EMGALITY) 120 MG/ML SOAJ, Inject into the skin., Disp: , Rfl:    HYDROcodone-acetaminophen  (NORCO) 7.5-325 MG tablet, Take 1 tablet by mouth every 8 (eight) hours as needed for moderate pain., Disp: , Rfl:    ipratropium (ATROVENT) 0.03 % nasal spray, 1-2 sprays in each nostril, Disp: , Rfl:    lamoTRIgine (LAMICTAL) 100 MG tablet, Take 1 tablet (100 mg total) by mouth 2 (two) times daily., Disp: 60 tablet, Rfl: 5   levocetirizine (XYZAL) 5 MG tablet, Take 5 mg by mouth every evening., Disp: , Rfl:    levothyroxine (SYNTHROID) 112 MCG tablet, Take 112 mcg by mouth daily before breakfast., Disp: , Rfl:    lithium carbonate 150 MG capsule, Take 1 capsule (150 mg total) by mouth daily. (Patient taking differently: Take 150 mg by mouth. Takes only on Monday,Wednesday,Friday.), Disp: 90 capsule, Rfl: 0   lithium carbonate 300 MG capsule, Take 1 capsule (300 mg total) by mouth at bedtime., Disp: 90 capsule, Rfl: 1   loperamide (IMODIUM) 2 MG capsule, Take 1 capsule (2 mg total) by mouth as needed for diarrhea or loose stools., Disp: 30 capsule, Rfl: 0   Melatonin 5 MG TABS, Take 20 mg by mouth at bedtime., Disp: , Rfl:    metoprolol succinate (TOPROL-XL) 50 MG 24 hr tablet, Take 1 tablet (50 mg total) by mouth daily. Take with or immediately following a meal., Disp: 90 tablet, Rfl: 3   naloxone (NARCAN) 0.4 MG/ML injection, , Disp: , Rfl:    ondansetron (ZOFRAN) 4 MG tablet, Take 1 tablet (4 mg total) by mouth every 8 (eight) hours as needed for nausea or vomiting., Disp: 20 tablet, Rfl: 0   pantoprazole (PROTONIX) 40 MG tablet, Take 1 tablet (40 mg total) by mouth daily., Disp: 90 tablet, Rfl: 0  polyethylene glycol-electrolytes (NULYTELY) 420 g solution, , Disp: , Rfl:    pravastatin (PRAVACHOL) 20 MG tablet, Take 20 mg by mouth daily., Disp: , Rfl: 3   pregabalin (LYRICA) 150 MG capsule, Take 150 mg by mouth 3 (three) times daily., Disp: , Rfl:    QUEtiapine (SEROQUEL) 200 MG tablet, TAKE 1 TABLET BY MOUTH AT BEDTIME, Disp: 90 tablet, Rfl: 0   senna-docusate (PERI-COLACE) 8.6-50 MG tablet,  Take 2 tablets by mouth 2 (two) times daily. For constipation, Disp: , Rfl:    verapamil (VERELAN PM) 120 MG 24 hr capsule, Take 1 capsule (120 mg total) by mouth at bedtime. For high blood pressure, Disp: , Rfl:    warfarin (COUMADIN) 2.5 MG tablet, Take 1.5 - 2 tablets once daily or AS DIRECTED BY COUMADIN CLINIC (Patient taking differently: Takes 1.5 tablets once daily or AS DIRECTED BY COUMADIN CLINIC), Disp: 150 tablet, Rfl: 0    Review of Systems  Constitutional:  Negative for chills, diaphoresis and fever.  HENT:  Negative for congestion, hearing loss, sore throat and tinnitus.   Eyes:  Negative for photophobia, redness and visual disturbance.  Respiratory:  Negative for cough, shortness of breath and wheezing.   Cardiovascular:  Negative for chest pain, palpitations and leg swelling.  Gastrointestinal:  Negative for abdominal pain, blood in stool, constipation, diarrhea, nausea and vomiting.  Genitourinary:  Negative for dysuria, flank pain and hematuria.  Musculoskeletal:  Negative for back pain and myalgias.  Skin:  Negative for rash.  Neurological:  Negative for dizziness, weakness and headaches.  Hematological:  Does not bruise/bleed easily.  Psychiatric/Behavioral:  Negative for behavioral problems, confusion, decreased concentration, dysphoric mood, hallucinations, self-injury and suicidal ideas. The patient is not nervous/anxious and is not hyperactive.       Objective:   Physical Exam Constitutional:      General: She is not in acute distress.    Appearance: She is well-developed. She is not diaphoretic.  HENT:     Head: Normocephalic and atraumatic.     Mouth/Throat:     Pharynx: No oropharyngeal exudate.  Eyes:     General: No scleral icterus.    Conjunctiva/sclera: Conjunctivae normal.  Cardiovascular:     Rate and Rhythm: Normal rate and regular rhythm.  Pulmonary:     Effort: Pulmonary effort is normal. No respiratory distress.     Breath sounds: No wheezing.   Abdominal:     General: There is no distension.  Musculoskeletal:        General: No tenderness.     Cervical back: Normal range of motion and neck supple.  Skin:    General: Skin is warm and dry.     Coloration: Skin is not pale.     Findings: No erythema or rash.  Neurological:     General: No focal deficit present.     Mental Status: She is alert and oriented to person, place, and time.     Motor: No abnormal muscle tone.     Coordination: Coordination normal.  Psychiatric:        Behavior: Behavior normal.        Thought Content: Thought content normal.        Judgment: Judgment normal.   07/07/2020 visit      08/05/2020:           Assessment & Plan:   Septic arthritis of right hallux IP joint where there is also radiographic suggestion of osteomyelitis:   Complete doxycycline through July and  reassess rechecking labs today as well.  History of C. difficile colitis: Fortunately has not had recurrence and is on a low risk antibiotic with doxycycline.  On anticoagulation she is on Coumadin through cardiology we will send my note to them.   I spent more than 30 minutes with the patient including greater than 50% of time in face to face counseling of the patient personally reviewing radiographs, long with pertinent laboratory microbiological data review of medical records and in coordination of her care.

## 2020-08-06 LAB — BASIC METABOLIC PANEL WITH GFR
BUN/Creatinine Ratio: 17 (calc) (ref 6–22)
BUN: 17 mg/dL (ref 7–25)
CO2: 29 mmol/L (ref 20–32)
Calcium: 9.6 mg/dL (ref 8.6–10.4)
Chloride: 104 mmol/L (ref 98–110)
Creat: 1.02 mg/dL — ABNORMAL HIGH (ref 0.60–0.93)
GFR, Est African American: 65 mL/min/{1.73_m2} (ref >=60)
GFR, Est Non African American: 56 mL/min/{1.73_m2} — ABNORMAL LOW (ref >=60)
Glucose, Bld: 88 mg/dL (ref 65–99)
Potassium: 4.8 mmol/L (ref 3.5–5.3)
Sodium: 141 mmol/L (ref 135–146)

## 2020-08-06 LAB — CBC WITH DIFFERENTIAL/PLATELET
Absolute Monocytes: 593 cells/uL (ref 200–950)
Basophils Absolute: 60 cells/uL (ref 0–200)
Basophils Relative: 0.8 %
Eosinophils Absolute: 210 cells/uL (ref 15–500)
Eosinophils Relative: 2.8 %
HCT: 39.2 % (ref 35.0–45.0)
Hemoglobin: 13 g/dL (ref 11.7–15.5)
Lymphs Abs: 1553 cells/uL (ref 850–3900)
MCH: 30.1 pg (ref 27.0–33.0)
MCHC: 33.2 g/dL (ref 32.0–36.0)
MCV: 90.7 fL (ref 80.0–100.0)
MPV: 10.8 fL (ref 7.5–12.5)
Monocytes Relative: 7.9 %
Neutro Abs: 5085 cells/uL (ref 1500–7800)
Neutrophils Relative %: 67.8 %
Platelets: 229 10*3/uL (ref 140–400)
RBC: 4.32 10*6/uL (ref 3.80–5.10)
RDW: 12.7 % (ref 11.0–15.0)
Total Lymphocyte: 20.7 %
WBC: 7.5 10*3/uL (ref 3.8–10.8)

## 2020-08-06 LAB — SEDIMENTATION RATE: Sed Rate: 2 mm/h (ref 0–30)

## 2020-08-06 LAB — C-REACTIVE PROTEIN: CRP: 1.4 mg/L (ref <8.0)

## 2020-08-11 DIAGNOSIS — M47816 Spondylosis without myelopathy or radiculopathy, lumbar region: Secondary | ICD-10-CM | POA: Diagnosis not present

## 2020-08-11 DIAGNOSIS — M79673 Pain in unspecified foot: Secondary | ICD-10-CM | POA: Diagnosis not present

## 2020-08-11 DIAGNOSIS — M961 Postlaminectomy syndrome, not elsewhere classified: Secondary | ICD-10-CM | POA: Diagnosis not present

## 2020-08-11 DIAGNOSIS — G894 Chronic pain syndrome: Secondary | ICD-10-CM | POA: Diagnosis not present

## 2020-08-11 DIAGNOSIS — Z79891 Long term (current) use of opiate analgesic: Secondary | ICD-10-CM | POA: Diagnosis not present

## 2020-08-11 DIAGNOSIS — Z79899 Other long term (current) drug therapy: Secondary | ICD-10-CM | POA: Diagnosis not present

## 2020-08-16 ENCOUNTER — Emergency Department (HOSPITAL_COMMUNITY): Payer: Medicare Other

## 2020-08-16 ENCOUNTER — Encounter (HOSPITAL_BASED_OUTPATIENT_CLINIC_OR_DEPARTMENT_OTHER): Payer: Self-pay | Admitting: Obstetrics and Gynecology

## 2020-08-16 ENCOUNTER — Emergency Department (HOSPITAL_BASED_OUTPATIENT_CLINIC_OR_DEPARTMENT_OTHER): Payer: Medicare Other

## 2020-08-16 ENCOUNTER — Inpatient Hospital Stay (HOSPITAL_BASED_OUTPATIENT_CLINIC_OR_DEPARTMENT_OTHER)
Admission: EM | Admit: 2020-08-16 | Discharge: 2020-08-23 | DRG: 071 | Disposition: A | Discharge status: Skilled Nursing Facility | Payer: Medicare Other | Attending: Internal Medicine | Admitting: Internal Medicine

## 2020-08-16 ENCOUNTER — Emergency Department (HOSPITAL_BASED_OUTPATIENT_CLINIC_OR_DEPARTMENT_OTHER): Payer: Medicare Other | Admitting: Radiology

## 2020-08-16 ENCOUNTER — Other Ambulatory Visit: Payer: Self-pay

## 2020-08-16 DIAGNOSIS — R296 Repeated falls: Secondary | ICD-10-CM | POA: Diagnosis not present

## 2020-08-16 DIAGNOSIS — G9340 Encephalopathy, unspecified: Secondary | ICD-10-CM | POA: Diagnosis present

## 2020-08-16 DIAGNOSIS — E66812 Obesity, class 2: Secondary | ICD-10-CM

## 2020-08-16 DIAGNOSIS — G43909 Migraine, unspecified, not intractable, without status migrainosus: Secondary | ICD-10-CM | POA: Diagnosis present

## 2020-08-16 DIAGNOSIS — Z7982 Long term (current) use of aspirin: Secondary | ICD-10-CM

## 2020-08-16 DIAGNOSIS — Z8249 Family history of ischemic heart disease and other diseases of the circulatory system: Secondary | ICD-10-CM

## 2020-08-16 DIAGNOSIS — Z66 Do not resuscitate: Secondary | ICD-10-CM | POA: Diagnosis present

## 2020-08-16 DIAGNOSIS — I959 Hypotension, unspecified: Secondary | ICD-10-CM | POA: Diagnosis not present

## 2020-08-16 DIAGNOSIS — E876 Hypokalemia: Secondary | ICD-10-CM | POA: Diagnosis not present

## 2020-08-16 DIAGNOSIS — I480 Paroxysmal atrial fibrillation: Secondary | ICD-10-CM | POA: Diagnosis not present

## 2020-08-16 DIAGNOSIS — Z902 Acquired absence of lung [part of]: Secondary | ICD-10-CM

## 2020-08-16 DIAGNOSIS — Z6836 Body mass index (BMI) 36.0-36.9, adult: Secondary | ICD-10-CM | POA: Diagnosis not present

## 2020-08-16 DIAGNOSIS — F3181 Bipolar II disorder: Secondary | ICD-10-CM | POA: Diagnosis present

## 2020-08-16 DIAGNOSIS — Z8673 Personal history of transient ischemic attack (TIA), and cerebral infarction without residual deficits: Secondary | ICD-10-CM | POA: Diagnosis not present

## 2020-08-16 DIAGNOSIS — Z885 Allergy status to narcotic agent status: Secondary | ICD-10-CM

## 2020-08-16 DIAGNOSIS — G894 Chronic pain syndrome: Secondary | ICD-10-CM | POA: Diagnosis not present

## 2020-08-16 DIAGNOSIS — R1110 Vomiting, unspecified: Secondary | ICD-10-CM | POA: Diagnosis present

## 2020-08-16 DIAGNOSIS — R1111 Vomiting without nausea: Secondary | ICD-10-CM | POA: Diagnosis not present

## 2020-08-16 DIAGNOSIS — Z825 Family history of asthma and other chronic lower respiratory diseases: Secondary | ICD-10-CM

## 2020-08-16 DIAGNOSIS — Z881 Allergy status to other antibiotic agents status: Secondary | ICD-10-CM

## 2020-08-16 DIAGNOSIS — E039 Hypothyroidism, unspecified: Secondary | ICD-10-CM | POA: Diagnosis present

## 2020-08-16 DIAGNOSIS — R4182 Altered mental status, unspecified: Secondary | ICD-10-CM

## 2020-08-16 DIAGNOSIS — G4700 Insomnia, unspecified: Secondary | ICD-10-CM | POA: Diagnosis present

## 2020-08-16 DIAGNOSIS — R531 Weakness: Secondary | ICD-10-CM | POA: Diagnosis not present

## 2020-08-16 DIAGNOSIS — R262 Difficulty in walking, not elsewhere classified: Secondary | ICD-10-CM | POA: Diagnosis not present

## 2020-08-16 DIAGNOSIS — G459 Transient cerebral ischemic attack, unspecified: Secondary | ICD-10-CM | POA: Diagnosis not present

## 2020-08-16 DIAGNOSIS — E6609 Other obesity due to excess calories: Secondary | ICD-10-CM | POA: Diagnosis present

## 2020-08-16 DIAGNOSIS — Z823 Family history of stroke: Secondary | ICD-10-CM

## 2020-08-16 DIAGNOSIS — D649 Anemia, unspecified: Secondary | ICD-10-CM | POA: Diagnosis not present

## 2020-08-16 DIAGNOSIS — R471 Dysarthria and anarthria: Secondary | ICD-10-CM | POA: Diagnosis present

## 2020-08-16 DIAGNOSIS — N898 Other specified noninflammatory disorders of vagina: Secondary | ICD-10-CM | POA: Diagnosis not present

## 2020-08-16 DIAGNOSIS — F4310 Post-traumatic stress disorder, unspecified: Secondary | ICD-10-CM | POA: Diagnosis present

## 2020-08-16 DIAGNOSIS — E785 Hyperlipidemia, unspecified: Secondary | ICD-10-CM | POA: Diagnosis present

## 2020-08-16 DIAGNOSIS — Z90710 Acquired absence of both cervix and uterus: Secondary | ICD-10-CM

## 2020-08-16 DIAGNOSIS — Z833 Family history of diabetes mellitus: Secondary | ICD-10-CM

## 2020-08-16 DIAGNOSIS — G253 Myoclonus: Secondary | ICD-10-CM | POA: Diagnosis present

## 2020-08-16 DIAGNOSIS — G3184 Mild cognitive impairment, so stated: Secondary | ICD-10-CM | POA: Diagnosis present

## 2020-08-16 DIAGNOSIS — Z741 Need for assistance with personal care: Secondary | ICD-10-CM | POA: Diagnosis not present

## 2020-08-16 DIAGNOSIS — Z880 Allergy status to penicillin: Secondary | ICD-10-CM

## 2020-08-16 DIAGNOSIS — Z8261 Family history of arthritis: Secondary | ICD-10-CM

## 2020-08-16 DIAGNOSIS — K219 Gastro-esophageal reflux disease without esophagitis: Secondary | ICD-10-CM | POA: Diagnosis not present

## 2020-08-16 DIAGNOSIS — E538 Deficiency of other specified B group vitamins: Secondary | ICD-10-CM | POA: Diagnosis present

## 2020-08-16 DIAGNOSIS — Z7901 Long term (current) use of anticoagulants: Secondary | ICD-10-CM

## 2020-08-16 DIAGNOSIS — I340 Nonrheumatic mitral (valve) insufficiency: Secondary | ICD-10-CM | POA: Diagnosis not present

## 2020-08-16 DIAGNOSIS — F3164 Bipolar disorder, current episode mixed, severe, with psychotic features: Secondary | ICD-10-CM | POA: Diagnosis not present

## 2020-08-16 DIAGNOSIS — Z820 Family history of epilepsy and other diseases of the nervous system: Secondary | ICD-10-CM

## 2020-08-16 DIAGNOSIS — M810 Age-related osteoporosis without current pathological fracture: Secondary | ICD-10-CM | POA: Diagnosis present

## 2020-08-16 DIAGNOSIS — M009 Pyogenic arthritis, unspecified: Secondary | ICD-10-CM | POA: Diagnosis present

## 2020-08-16 DIAGNOSIS — G9341 Metabolic encephalopathy: Principal | ICD-10-CM | POA: Diagnosis present

## 2020-08-16 DIAGNOSIS — F419 Anxiety disorder, unspecified: Secondary | ICD-10-CM | POA: Diagnosis present

## 2020-08-16 DIAGNOSIS — K5730 Diverticulosis of large intestine without perforation or abscess without bleeding: Secondary | ICD-10-CM | POA: Diagnosis not present

## 2020-08-16 DIAGNOSIS — G8929 Other chronic pain: Secondary | ICD-10-CM | POA: Diagnosis present

## 2020-08-16 DIAGNOSIS — K5900 Constipation, unspecified: Secondary | ICD-10-CM | POA: Diagnosis not present

## 2020-08-16 DIAGNOSIS — Z79890 Hormone replacement therapy (postmenopausal): Secondary | ICD-10-CM

## 2020-08-16 DIAGNOSIS — Z79899 Other long term (current) drug therapy: Secondary | ICD-10-CM

## 2020-08-16 DIAGNOSIS — Z20822 Contact with and (suspected) exposure to covid-19: Secondary | ICD-10-CM | POA: Diagnosis not present

## 2020-08-16 DIAGNOSIS — M6281 Muscle weakness (generalized): Secondary | ICD-10-CM | POA: Diagnosis not present

## 2020-08-16 DIAGNOSIS — Z886 Allergy status to analgesic agent status: Secondary | ICD-10-CM

## 2020-08-16 DIAGNOSIS — I361 Nonrheumatic tricuspid (valve) insufficiency: Secondary | ICD-10-CM | POA: Diagnosis not present

## 2020-08-16 DIAGNOSIS — Z8349 Family history of other endocrine, nutritional and metabolic diseases: Secondary | ICD-10-CM

## 2020-08-16 DIAGNOSIS — Z7401 Bed confinement status: Secondary | ICD-10-CM | POA: Diagnosis not present

## 2020-08-16 DIAGNOSIS — I10 Essential (primary) hypertension: Secondary | ICD-10-CM | POA: Diagnosis not present

## 2020-08-16 DIAGNOSIS — R41841 Cognitive communication deficit: Secondary | ICD-10-CM | POA: Diagnosis not present

## 2020-08-16 LAB — COMPREHENSIVE METABOLIC PANEL
ALT: 10 U/L (ref 0–44)
AST: 14 U/L — ABNORMAL LOW (ref 15–41)
Albumin: 4.5 g/dL (ref 3.5–5.0)
Alkaline Phosphatase: 56 U/L (ref 38–126)
Anion gap: 12 (ref 5–15)
BUN: 11 mg/dL (ref 8–23)
CO2: 26 mmol/L (ref 22–32)
Calcium: 10.1 mg/dL (ref 8.9–10.3)
Chloride: 103 mmol/L (ref 98–111)
Creatinine, Ser: 0.94 mg/dL (ref 0.44–1.00)
GFR, Estimated: >60 mL/min (ref >60)
Glucose, Bld: 103 mg/dL — ABNORMAL HIGH (ref 70–99)
Potassium: 4.1 mmol/L (ref 3.5–5.1)
Sodium: 141 mmol/L (ref 135–145)
Total Bilirubin: 0.8 mg/dL (ref 0.3–1.2)
Total Protein: 7.1 g/dL (ref 6.5–8.1)

## 2020-08-16 LAB — RAPID URINE DRUG SCREEN, HOSP PERFORMED
Amphetamines: NOT DETECTED
Barbiturates: NOT DETECTED
Benzodiazepines: NOT DETECTED
Cocaine: NOT DETECTED
Opiates: POSITIVE — AB
Tetrahydrocannabinol: NOT DETECTED

## 2020-08-16 LAB — URINALYSIS, ROUTINE W REFLEX MICROSCOPIC
Bilirubin Urine: NEGATIVE
Glucose, UA: NEGATIVE mg/dL
Hgb urine dipstick: NEGATIVE
Nitrite: NEGATIVE
Protein, ur: NEGATIVE mg/dL
Specific Gravity, Urine: 1.011 (ref 1.005–1.030)
pH: 7 (ref 5.0–8.0)

## 2020-08-16 LAB — CBC
HCT: 42.6 % (ref 36.0–46.0)
Hemoglobin: 13.9 g/dL (ref 12.0–15.0)
MCH: 29.9 pg (ref 26.0–34.0)
MCHC: 32.6 g/dL (ref 30.0–36.0)
MCV: 91.6 fL (ref 80.0–100.0)
Platelets: 224 10*3/uL (ref 150–400)
RBC: 4.65 MIL/uL (ref 3.87–5.11)
RDW: 13.3 % (ref 11.5–15.5)
WBC: 9.9 10*3/uL (ref 4.0–10.5)
nRBC: 0 % (ref 0.0–0.2)

## 2020-08-16 LAB — AMMONIA: Ammonia: 20 umol/L (ref 9–35)

## 2020-08-16 LAB — MAGNESIUM: Magnesium: 2 mg/dL (ref 1.7–2.4)

## 2020-08-16 LAB — PHOSPHORUS: Phosphorus: 2.7 mg/dL (ref 2.5–4.6)

## 2020-08-16 LAB — PROTIME-INR
INR: 2 — ABNORMAL HIGH (ref 0.8–1.2)
Prothrombin Time: 23 seconds — ABNORMAL HIGH (ref 11.4–15.2)

## 2020-08-16 LAB — LITHIUM LEVEL: Lithium Lvl: 0.38 mmol/L — ABNORMAL LOW (ref 0.60–1.20)

## 2020-08-16 NOTE — ED Notes (Signed)
Patient transported to X-ray 

## 2020-08-16 NOTE — ED Notes (Signed)
Pt incontinent of urine.  

## 2020-08-16 NOTE — ED Notes (Signed)
Per CT tech, pt refused drinking oral contrast for CT.

## 2020-08-16 NOTE — ED Triage Notes (Signed)
Patient reports x1 weeks ago she began having problems communicating and talking appropriately. Patient reports last known normal was the Friday before last.

## 2020-08-16 NOTE — ED Notes (Signed)
ED TO INPATIENT HANDOFF REPORT  ED Nurse Name and Phone #:   S Name/Age/Gender Meredith [name] 70 y.o. female Room/Bed: DB010/DB010  Code Status   Code Status: Prior  Home/SNF/Other Home Patient oriented to: self, place, time and situation Is this baseline? Yes   Triage Complete: Triage complete  Chief Complaint Acute encephalopathy [G93.40]  Triage Note Patient reports x1 weeks ago she began having problems communicating and talking appropriately. Patient reports last known normal was the Friday before last.      Allergies Allergies  Allergen Reactions  . Codeine Anaphylaxis    Patient states she can take codeine but not percocet   . Adhesive [Tape]     blister  . Celebrex [Celecoxib] Hives  . Erythromycin Other (See Comments)  . Other     Other reaction(s): MAKE HER CRAZY Other reaction(s): rash, high fever,welts Other reaction(s): rash Other reaction(s): severe diarrhea/vomiting Other reaction(s): Unknown  . Penicillamine Diarrhea    Other reaction(s): Vomiting  . Sulfa Antibiotics Hives  . Tricyclic Antidepressants     Doesn't help  . Amoxicillin Rash  . Ampicillin Rash  . Cephalexin Diarrhea  . Penicillins Rash    Did it involve swelling of the face/tongue/throat, SOB, or low BP? Yes Did it involve sudden or severe rash/hives, skin peeling, or any reaction on the inside of your mouth or nose? NO Did you need to seek medical attention at a hospital or doctor's office? NO When did it last happen? childhood If all above answers are "NO", may proceed with cephalosporin use.    Level of Care/Admitting Diagnosis ED Disposition    ED Disposition  Admit   Condition  --   Comment  Hospital Area: [facility] [[id number]]  Level of Care: Telemetry [5]  Admit to tele based on following criteria: Other see comments  Comments: acute neurologic condition  Interfacility transfer: Yes  May place patient in observation at [facility]  or [place] if equivalent level of care is available:: Yes  Covid Evaluation: Asymptomatic Screening Protocol (No Symptoms)  Diagnosis: Acute encephalopathy [166063]  Admitting Physician: Vianne Bulls [[id number]]  Attending Physician: Vianne Bulls [[id number]]         B Medical/Surgery History Past Medical History:  Diagnosis Date  . Anxiety   . Atrial fibrillation ([place])   . Bipolar 2 disorder ([place])   . Depression   . Hematoma 07/06/2020  . History of cardioversion    x2  . Hyperlipidemia   . Hypothyroidism   . Migraine headache   . Osteoporosis   . Septic arthritis of interphalangeal joint of toe of right foot ([place]) 07/06/2020   Past Surgical History:  Procedure Laterality Date  . ABLATION    . BACK SURGERY  2014  . CARDIOVERSION  12/2013, 01/2014   x2  . KNEE SURGERY     ORTHOSCOPIC  . LUNG REMOVAL, PARTIAL Right    BENIGN LUNG CYST  . SYNOVIAL BIOPSY Right 06/16/2020   Procedure: Right hallux interphalangeal joint arthrotomy and biopsy;  Surgeon: [name], MD;  Location: [facility];  Service: Orthopedics;  Laterality: Right;  . THORACIC SPINE SURGERY Right   . THUMB ARTHROSCOPY       A IV Location/Drains/Wounds Patient Lines/Drains/Airways Status    Active Line/Drains/Airways    Name Placement date Placement time Site Days   Peripheral IV 08/16/20 20 G Right Antecubital 08/16/20  1455  Antecubital  less than 1   Incision (Closed) 06/16/20 Foot  Right 06/16/20  0922  -- 61          Intake/Output Last 24 hours No intake or output data in the 24 hours ending 08/16/20 2150  Labs/Imaging Results for orders placed or performed during the hospital encounter of 08/16/20 (from the past 48 hour(s))  Comprehensive metabolic panel     Status: Abnormal   Collection Time: 08/16/20  2:51 PM  Result Value Ref Range   Sodium 141 135 - 145 mmol/L   Potassium 4.1 3.5 - 5.1 mmol/L   Chloride 103 98 - 111 mmol/L   CO2 26 22 - 32 mmol/L   Glucose,  Bld 103 (H) 70 - 99 mg/dL    Comment: Glucose reference range applies only to samples taken after fasting for at least 8 hours.   BUN 11 8 - 23 mg/dL   Creatinine, Ser 0.94 0.44 - 1.00 mg/dL   Calcium 10.1 8.9 - 10.3 mg/dL   Total Protein 7.1 6.5 - 8.1 g/dL   Albumin 4.5 3.5 - 5.0 g/dL   AST 14 (L) 15 - 41 U/L   ALT 10 0 - 44 U/L   Alkaline Phosphatase 56 38 - 126 U/L   Total Bilirubin 0.8 0.3 - 1.2 mg/dL   GFR, Estimated >60 >60 mL/min    Comment: (NOTE) Calculated using the CKD-EPI Creatinine Equation (2021)    Anion gap 12 5 - 15    Comment: Performed at [facility], [address], [place], [place] [address]  CBC     Status: None   Collection Time: 08/16/20  2:51 PM  Result Value Ref Range   WBC 9.9 4.0 - 10.5 K/uL   RBC 4.65 3.87 - 5.11 MIL/uL   Hemoglobin 13.9 12.0 - 15.0 g/dL   HCT 42.6 36.0 - 46.0 %   MCV 91.6 80.0 - 100.0 fL   MCH 29.9 26.0 - 34.0 pg   MCHC 32.6 30.0 - 36.0 g/dL   RDW 13.3 11.5 - 15.5 %   Platelets 224 150 - 400 K/uL   nRBC 0.0 0.0 - 0.2 %    Comment: Performed at [facility], [address], [place], [place] [address]  Lithium level     Status: Abnormal   Collection Time: 08/16/20  2:52 PM  Result Value Ref Range   Lithium Lvl 0.38 (L) 0.60 - 1.20 mmol/L    Comment: Performed at [facility], [place] [address] [place], [place] [address]  Protime-INR     Status: Abnormal   Collection Time: 08/16/20  3:56 PM  Result Value Ref Range   Prothrombin Time 23.0 (H) 11.4 - 15.2 seconds   INR 2.0 (H) 0.8 - 1.2    Comment: (NOTE) INR goal varies based on device and disease states. Performed at [facility], [address] [place], [place] [address]   Magnesium     Status: None   Collection Time: 08/16/20  3:56 PM  Result Value Ref Range   Magnesium 2.0 1.7 - 2.4 mg/dL    Comment: Performed at [facility], [address] [place], [place] [address]  Phosphorus      Status: None   Collection Time: 08/16/20  3:56 PM  Result Value Ref Range   Phosphorus 2.7 2.5 - 4.6 mg/dL    Comment: Performed at [facility], [address] [place], [place] [address]  Ammonia     Status: None   Collection Time: 08/16/20  3:56 PM  Result Value Ref  Range   Ammonia 20 9 - 35 umol/L    Comment: Performed at [facility], [place], [place] [address]  Urinalysis, Routine w reflex microscopic Urine, Clean Catch     Status: Abnormal   Collection Time: 08/16/20  6:14 PM  Result Value Ref Range   Color, Urine YELLOW YELLOW   APPearance CLEAR CLEAR   Specific Gravity, Urine 1.011 1.005 - 1.030   pH 7.0 5.0 - 8.0   Glucose, UA NEGATIVE NEGATIVE mg/dL   Hgb urine dipstick NEGATIVE NEGATIVE   Bilirubin Urine NEGATIVE NEGATIVE   Ketones, ur TRACE (A) NEGATIVE mg/dL   Protein, ur NEGATIVE NEGATIVE mg/dL   Nitrite NEGATIVE NEGATIVE   Leukocytes,Ua TRACE (A) NEGATIVE   RBC / HPF 0-5 0 - 5 RBC/hpf   WBC, UA 0-5 0 - 5 WBC/hpf   Squamous Epithelial / LPF 0-5 0 - 5    Comment: Performed at [facility], [address] [place], [place] [address]  Urine rapid drug screen (hosp performed)     Status: Abnormal   Collection Time: 08/16/20  6:14 PM  Result Value Ref Range   Opiates POSITIVE (A) NONE DETECTED   Cocaine NONE DETECTED NONE DETECTED   Benzodiazepines NONE DETECTED NONE DETECTED   Amphetamines NONE DETECTED NONE DETECTED   Tetrahydrocannabinol NONE DETECTED NONE DETECTED   Barbiturates NONE DETECTED NONE DETECTED    Comment: (NOTE) DRUG SCREEN FOR MEDICAL PURPOSES ONLY.  IF CONFIRMATION IS NEEDED FOR ANY PURPOSE, NOTIFY LAB WITHIN 5 DAYS.  LOWEST DETECTABLE LIMITS FOR URINE DRUG SCREEN Drug Class                     Cutoff (ng/mL) Amphetamine and metabolites    1000 Barbiturate and metabolites    200 Benzodiazepine                 263 Tricyclics and metabolites     300 Opiates and  metabolites        300 Cocaine and metabolites        300 THC                            50 Performed at [facility], [address] [place], [place] [address]    CT Abdomen Pelvis Wo Contrast  Result Date: 08/16/2020 CLINICAL DATA:  Abdominal distension, pain, and constipation. Altered mental status. EXAM: CT ABDOMEN AND PELVIS WITHOUT CONTRAST TECHNIQUE: Multidetector CT imaging of the abdomen and pelvis was performed following the standard protocol without IV contrast. COMPARISON:  CT 11/24/2017 FINDINGS: Lower chest: Mild linear lingular atelectasis or scar. No pleural fluid. Normal heart size. Hepatobiliary: Mild motion artifact through the liver and gallbladder. Allowing for this, no evidence of focal hepatic abnormality. Tiny low-density lesions in the liver on prior contrast-enhanced exam are not seen. No gallbladder inflammation or calcified gallstone. No biliary dilatation, although the common bile duct is not well-defined on the current exam. Pancreas: No ductal dilatation or inflammation. Spleen: Normal in size without focal abnormality. Adrenals/Urinary Tract: Normal adrenal glands. Slight prominence of the renal collecting systems felt to be due to prominently distended urinary bladder. No renal calculi. Trace chronic bilateral perinephric edema. Distended urinary bladder without wall thickening. No focal bladder abnormality. Stomach/Bowel: Small hiatal hernia. Physiologically distended stomach. No small bowel obstruction or inflammation. Normal appendix. Small volume of colonic stool, no increased stool burden. No colonic wall thickening. Occasional sigmoid  diverticula without diverticulitis. Vascular/Lymphatic: Aortic atherosclerosis without aneurysm. No portal venous or mesenteric gas. No abdominopelvic adenopathy. Reproductive: Status post hysterectomy. No adnexal masses. Other: No free air, free fluid, or intra-abdominal fluid collection. Musculoskeletal: Surgical  hardware in the thoracic spine is partially included. There are no acute or suspicious osseous abnormalities. IMPRESSION: 1. Distended urinary bladder. Mild prominence of the renal collecting systems, likely secondary to bladder distention. No bladder wall thickening or urinary tract inflammation. Recommend correlation with urinalysis. 2. Minimal sigmoid colonic diverticulosis without diverticulitis. Aortic Atherosclerosis (ICD10-I70.0). Electronically Signed   By: Keith Rake M.D.   On: 08/16/2020 18:09   DG Chest 2 View  Result Date: 08/16/2020 CLINICAL DATA:  Altered mental status EXAM: CHEST - 2 VIEW COMPARISON:  07/15/2019, 10/02/2018 FINDINGS: The heart size and mediastinal contours are within normal limits. Both lungs are clear. Surgical hardware in the midthoracic region. IMPRESSION: No active cardiopulmonary disease. Electronically Signed   By: Donavan Foil M.D.   On: 08/16/2020 16:26   CT Head Wo Contrast  Result Date: 08/16/2020 CLINICAL DATA:  Mental status change EXAM: CT HEAD WITHOUT CONTRAST TECHNIQUE: Contiguous axial images were obtained from the base of the skull through the vertex without intravenous contrast. COMPARISON:  MRI 07/15/2019, CT brain 07/15/2019 FINDINGS: Brain: No acute territorial infarction, hemorrhage or intracranial mass. Mild atrophy. Stable ventricle size and configuration. Vascular: No hyperdense vessels.  No unexpected calcification Skull: Normal. Negative for fracture or focal lesion. Sinuses/Orbits: No acute finding. Other: None IMPRESSION: No CT evidence for acute intracranial abnormality.  Mild atrophy Electronically Signed   By: Donavan Foil M.D.   On: 08/16/2020 16:19    Pending Labs Unresulted Labs (From admission, onward)   None      Vitals/Pain Today's Vitals   08/16/20 1845 08/16/20 1900 08/16/20 2000 08/16/20 2100  BP:  (!) 153/69 (!) 160/71 (!) 143/67  Pulse: 73 76 79 78  Resp: (!) 9 16  16   Temp:      SpO2: 100% 98% 97% 98%   Height:        Isolation Precautions No active isolations  Medications Medications - No data to display  Mobility walks High fall risk   Focused Assessments    R Recommendations: See Admitting Provider Note  Report given to:   Additional Notes:

## 2020-08-16 NOTE — ED Provider Notes (Signed)
[facility] EMERGENCY DEPT Provider Note   CSN: [id number] Arrival date & time: 08/16/20  1441     History Chief Complaint  Patient presents with   Altered Mental Status    Meredith [name] is a 70 y.o. female.  HPI Patient has history of bipolar disorder, paroxysmal atrial fibrillation treated with Coumadin, PTSD, stroke and other medical problems.  Patient moved to the [place] area from [place] 7 years ago.  She lives independently.  Patient's brother checks in on her frequently and talks to her.  He reports that there was a change about a week ago. She started having a lot of difficulty concentrating and cannot seem to put 2 thoughts together per her brother.  They have also noticed that she is exhibiting some increased tremor.  Seems to be of the right lower extremity.  He reports there have been other similar episodes actually much worse than this previously where she had become very confused and had to be hospitalized.  He reports different therapies have been proposed.  Reports at 1 point time was consider lithium toxicity.  Patient and brother agree she does not use any drugs of abuse.  No alcohol use.  Patient denies she has had fevers or chills.  She denies she is felt sick but endorses she has not felt well either.  She has been maintaining her home.  Patient's brother was out of town for the weekend.  He reports that things do seem to get worse when he is gone.  He reports however, that her house is in order.  They are not dirty dishes or disarray of living.  Patient reports at baseline she usually drives a car although she states she has not been driving this week because of not feeling right.  [place] she had falls but that is been a couple years ago.  No known fall or trauma recently.  Patient does take Coumadin for atrial fibrillation.  She denies headaches.  She reports she has been eating and drinking per usual.    Past Medical History:  Diagnosis  Date   Anxiety    Atrial fibrillation ([place])    Bipolar 2 disorder ([place])    Depression    Hematoma 07/06/2020   History of cardioversion    x2   Hyperlipidemia    Hypothyroidism    Migraine headache    Osteoporosis    Septic arthritis of interphalangeal joint of toe of right foot ([place]) 07/06/2020    Patient Active Problem List   Diagnosis Date Noted   Septic arthritis of interphalangeal joint of toe of right foot ([place]) 07/06/2020   Hematoma 07/06/2020   Acquired hallux varus 04/08/2020   Acquired thrombophilia ([place]) 04/08/2020   Allergic rhinitis due to pollen 04/08/2020   Anemia 04/08/2020   Aphasia 04/08/2020   Bipolar 2 disorder ([place]) 04/08/2020   Carpal tunnel syndrome of right wrist 04/08/2020   Constipation 04/08/2020   Irritable bowel syndrome 04/08/2020   Slow transit constipation 04/08/2020   Gastroesophageal reflux disease without esophagitis 04/08/2020   Long term (current) use of anticoagulants 04/08/2020   Menopausal and postmenopausal disorder 04/08/2020   Morbid obesity ([place]) 04/08/2020   Mixed hyperlipidemia 04/08/2020   Migraine without aura, not refractory 04/08/2020   Obesity due to excess calories 04/08/2020   Osteopenia 04/08/2020   Other specified postprocedural states 04/08/2020   Primary insomnia 04/08/2020   Primary osteoarthritis, unspecified ankle and foot 04/08/2020   Pure hypercholesterolemia 04/08/2020   Recurrent falls 04/08/2020  Unsteady gait 04/08/2020   Urinary tract infectious disease 04/08/2020   Vitamin D deficiency 04/08/2020   Allergic rhinitis 03/11/2020   Vasomotor rhinitis 03/11/2020   C. difficile diarrhea 07/15/2019   Chronic kidney disease, stage III (moderate) ([place]) 07/15/2019   Leukocytosis 07/15/2019   Diabetes mellitus type 2, controlled ([place]) 07/15/2019   Lithium toxicity 10/27/2018   Acute encephalopathy    Acute ischemic stroke ([place]) 10/22/2018   Renal insufficiency 10/22/2018   Pain and swelling of right lower  leg 10/22/2018   Hypothyroidism 02/24/2018   Encounter for therapeutic drug monitoring 01/03/2018   PTSD (post-traumatic stress disorder) 11/29/2017   Falls 11/25/2017   Disorientation    Delirium 11/24/2017   Stiffness of finger joint of right hand 06/18/2017   Osteoarthritis of carpometacarpal (CMC) joint of thumb 05/21/2017   Anxiety 11/29/2016   Chronic pain syndrome 07/30/2016   Bipolar disorder, curr episode mixed, severe, with psychotic features ([place]) 07/28/2016   PAF (paroxysmal atrial fibrillation) ([place]) 11/14/2015   Intractable chronic migraine without aura 04/24/2012    Past Surgical History:  Procedure Laterality Date   ABLATION     BACK SURGERY  2014   CARDIOVERSION  12/2013, 01/2014   x2   KNEE SURGERY     ORTHOSCOPIC   LUNG REMOVAL, PARTIAL Right    BENIGN LUNG CYST   SYNOVIAL BIOPSY Right 06/16/2020   Procedure: Right hallux interphalangeal joint arthrotomy and biopsy;  Surgeon: [name], MD;  Location: [facility];  Service: Orthopedics;  Laterality: Right;   THORACIC SPINE SURGERY Right    THUMB ARTHROSCOPY       OB History     Gravida      Para      Term      Preterm      AB      Living  3      SAB      IAB      Ectopic      Multiple      Live Births              Family History  Problem Relation Age of Onset   Arthritis Mother    Depression Mother    Diabetes Mother    Heart disease Mother    Stroke Mother    Early death Father    Heart disease Father    Atrial fibrillation Brother    Hyperlipidemia Brother    Multiple sclerosis Sister    Alcohol abuse Brother    Asthma Brother    Drug abuse Brother    Hypertension Brother    Mental illness Brother     Social History   Tobacco Use   Smoking status: Never   Smokeless tobacco: Never  Substance Use Topics   Alcohol use: No   Drug use: No    Home Medications Prior to Admission medications   Medication Sig Start Date End Date Taking?  Authorizing Provider  Melatonin 5 MG TABS Take 20 mg by mouth at bedtime.   Yes [provider]  senna-docusate (PERI-COLACE) 8.6-50 MG tablet Take 2 tablets by mouth 2 (two) times daily. For constipation 07/31/16  Yes Nwoko, Herbert Pun I, NP  ARIPiprazole (ABILIFY) 30 MG tablet Take 1 tablet (30 mg total) by mouth daily. 03/31/20   Cottle, Billey Co., MD  Ascorbic Acid (VITAMIN C) 1000 MG tablet Take 1,000 mg by mouth daily.    [provider]  aspirin 325 MG tablet Take 325 mg  by mouth daily as needed for headache.     [provider]  baclofen (LIORESAL) 10 MG tablet Take 1 tablet by mouth as needed.    [provider]  bisacodyl (DULCOLAX) 5 MG EC tablet PRN 03/18/20   [provider]  Cholecalciferol (VITAMIN D3) 125 MCG (5000 UT) TABS Take 1 tablet by mouth daily at 12 noon.    [provider]  clotrimazole (LOTRIMIN) 1 % cream APPLY CREAM TOPICALLY TO AFFECTED AREA TWICE DAILY 08/03/19   [provider]  docusate sodium (COLACE) 100 MG capsule 1 capsule as needed    [provider]  doxycycline (VIBRA-TABS) 100 MG tablet Take 1 tablet (100 mg total) by mouth 2 (two) times daily. 07/11/20   Truman Hayward, MD  EPINEPHrine 0.3 mg/0.3 mL IJ SOAJ injection  07/07/19   [provider]  Erenumab-aooe (AIMOVIG) 140 MG/ML SOAJ Inject 140 mg into the skin every 30 (thirty) days.    [provider]  estradiol (ESTRACE) 1 MG tablet Take 1 mg by mouth daily. 01/27/19   [provider]  ferrous sulfate 325 (65 FE) MG tablet Take 325 mg by mouth 2 (two) times daily.    [provider]  fluticasone (FLONASE SENSIMIST) 27.5 MCG/SPRAY nasal spray Place 1 spray into the nose daily.    [provider]  Galcanezumab-gnlm (EMGALITY) 120 MG/ML SOAJ Inject into the skin.    [provider]  HYDROcodone-acetaminophen (NORCO) 7.5-325 MG tablet Take 1 tablet by mouth every 8 (eight) hours as needed  for moderate pain.    [provider]  ipratropium (ATROVENT) 0.03 % nasal spray 1-2 sprays in each nostril 03/08/20   [provider]  lamoTRIgine (LAMICTAL) 100 MG tablet Take 1 tablet (100 mg total) by mouth 2 (two) times daily. 05/31/20   Cottle, Billey Co., MD  levocetirizine (XYZAL) 5 MG tablet Take 5 mg by mouth every evening. 07/07/19   [provider]  levothyroxine (SYNTHROID) 112 MCG tablet Take 112 mcg by mouth daily before breakfast.    [provider]  lithium carbonate 150 MG capsule Take 1 capsule (150 mg total) by mouth daily. Patient taking differently: Take 150 mg by mouth. Takes only on Monday,Wednesday,Friday. 01/28/20   Cottle, Billey Co., MD  lithium carbonate 300 MG capsule Take 1 capsule (300 mg total) by mouth at bedtime. 03/31/20   Cottle, Billey Co., MD  loperamide (IMODIUM) 2 MG capsule Take 1 capsule (2 mg total) by mouth as needed for diarrhea or loose stools. 07/20/19   Nita Sells, MD  metoprolol succinate (TOPROL-XL) 50 MG 24 hr tablet Take 1 tablet (50 mg total) by mouth daily. Take with or immediately following a meal. 02/29/20   [name], Wonda Cheng, MD  naloxone [facility]) 0.4 MG/ML injection  05/05/19   [provider]  ondansetron (ZOFRAN) 4 MG tablet Take 1 tablet (4 mg total) by mouth every 8 (eight) hours as needed for nausea or vomiting. 11/11/19   Evelina Bucy, DPM  pantoprazole (PROTONIX) 40 MG tablet Take 1 tablet (40 mg total) by mouth daily. 07/07/18   [place], Betty G, MD  polyethylene glycol-electrolytes (NULYTELY) 420 g solution  03/18/20   [provider]  pravastatin (PRAVACHOL) 20 MG tablet Take 20 mg by mouth daily. 10/22/17   [provider]  pregabalin (LYRICA) 150 MG capsule Take 150 mg by mouth 3 (three) times daily.    [provider]  QUEtiapine (SEROQUEL) 200  MG tablet TAKE 1 TABLET BY MOUTH AT BEDTIME 06/13/20   Cottle, Billey Co., MD  verapamil (VERELAN PM) 120 MG 24 hr  capsule Take 1 capsule (120 mg total) by mouth at bedtime. For high blood pressure 07/31/16   Lindell Spar I, NP  warfarin (COUMADIN) 2.5 MG tablet Take 1.5 - 2 tablets once daily or AS DIRECTED BY COUMADIN CLINIC Patient taking differently: Takes 1.5 tablets once daily or AS DIRECTED BY COUMADIN CLINIC 04/14/20   [name], Wonda Cheng, MD    Allergies    Codeine, Adhesive [tape], Celebrex [celecoxib], Erythromycin, Other, Penicillamine, Sulfa antibiotics, Tricyclic antidepressants, Amoxicillin, Ampicillin, Cephalexin, and Penicillins  Review of Systems   Review of Systems 10 systems reviewed and negative except as per HPI Physical Exam Updated Vital Signs BP (!) 160/71   Pulse 79   Temp 98.7 F (37.1 C)   Resp 16   Ht 4\' 11"  (1.499 m)   LMP  (LMP Unknown)   SpO2 97%   BMI 36.17 kg/m   Physical Exam Constitutional:      Comments: Patient is awake and alert.  She is not somnolent.  She does seem cognitively slow.  No respiratory distress.  She is well groomed.  HENT:     Head: Normocephalic and atraumatic.     Right Ear: Tympanic membrane normal.     Left Ear: Tympanic membrane normal.     Nose: Nose normal.     Mouth/Throat:     Mouth: Mucous membranes are moist.     Pharynx: Oropharynx is clear.     Comments: Patient is edentulous.  Mucous membranes are pink moist and in good condition.  Posterior oropharynx widely patent. Eyes:     Extraocular Movements: Extraocular movements intact.     Pupils: Pupils are equal, round, and reactive to light.  Cardiovascular:     Rate and Rhythm: Normal rate and regular rhythm.  Pulmonary:     Effort: Pulmonary effort is normal.     Breath sounds: Normal breath sounds.  Abdominal:     General: There is no distension.     Palpations: Abdomen is soft.     Tenderness: There is abdominal tenderness. There is no guarding.     Comments: Abdomen is soft.  Patient endorses discomfort throughout the abdomen.  No guarding.  She reports she had not  perceived abdominal discomfort till I started performing the exam.  Musculoskeletal:        General: No swelling or tenderness. Normal range of motion.     Cervical back: Neck supple.     Right lower leg: No edema.     Left lower leg: No edema.  Skin:    General: Skin is warm and dry.  Neurological:     Comments: Patient is awake and alert.  She is not somnolent.  She does seem to exhibit some degree of expressive aphasia.  She will start to answer questions but then cannot recall the corollary to what she was trying to say.  Speech is clear without slurring.  The words used and sentences are appropriate.  No apparent receptive aphasia.  She can follow commands appropriately.  No focal motor deficits.  Patient has very good grip strength and push pull upper extremities.  Patient can elevate and hold both lower extremities off the bed 10 seconds.  At rest patient is exhibiting a persistent tremoring activity of her right lower extremity.  She does not have any asterixis or tremor with hands held  out right.  Psychiatric:     Comments: Patient is calm.  Affect seems flat.    ED Results / Procedures / Treatments   Labs (all labs ordered are listed, but only abnormal results are displayed) Labs Reviewed  COMPREHENSIVE METABOLIC PANEL - Abnormal; Notable for the following components:      Result Value   Glucose, Bld 103 (*)    AST 14 (*)    All other components within normal limits  LITHIUM LEVEL - Abnormal; Notable for the following components:   Lithium Lvl 0.38 (*)    All other components within normal limits  PROTIME-INR - Abnormal; Notable for the following components:   Prothrombin Time 23.0 (*)    INR 2.0 (*)    All other components within normal limits  URINALYSIS, ROUTINE W REFLEX MICROSCOPIC - Abnormal; Notable for the following components:   Ketones, ur TRACE (*)    Leukocytes,Ua TRACE (*)    All other components within normal limits  RAPID URINE DRUG SCREEN, HOSP PERFORMED -  Abnormal; Notable for the following components:   Opiates POSITIVE (*)    All other components within normal limits  CBC  MAGNESIUM  PHOSPHORUS  AMMONIA    EKG EKG Interpretation  Date/Time:  Tuesday August 16 2020 15:01:58 EDT Ventricular Rate:  79 PR Interval:  182 QRS Duration: 85 QT Interval:  379 QTC Calculation: 435 R Axis:   52 Text Interpretation: Sinus rhythm normal, no ischemic changes. Confirmed by Charlesetta Shanks [phone]) on 08/16/2020 7:16:05 PM  Radiology CT Abdomen Pelvis Wo Contrast  Result Date: 08/16/2020 CLINICAL DATA:  Abdominal distension, pain, and constipation. Altered mental status. EXAM: CT ABDOMEN AND PELVIS WITHOUT CONTRAST TECHNIQUE: Multidetector CT imaging of the abdomen and pelvis was performed following the standard protocol without IV contrast. COMPARISON:  CT 11/24/2017 FINDINGS: Lower chest: Mild linear lingular atelectasis or scar. No pleural fluid. Normal heart size. Hepatobiliary: Mild motion artifact through the liver and gallbladder. Allowing for this, no evidence of focal hepatic abnormality. Tiny low-density lesions in the liver on prior contrast-enhanced exam are not seen. No gallbladder inflammation or calcified gallstone. No biliary dilatation, although the common bile duct is not well-defined on the current exam. Pancreas: No ductal dilatation or inflammation. Spleen: Normal in size without focal abnormality. Adrenals/Urinary Tract: Normal adrenal glands. Slight prominence of the renal collecting systems felt to be due to prominently distended urinary bladder. No renal calculi. Trace chronic bilateral perinephric edema. Distended urinary bladder without wall thickening. No focal bladder abnormality. Stomach/Bowel: Small hiatal hernia. Physiologically distended stomach. No small bowel obstruction or inflammation. Normal appendix. Small volume of colonic stool, no increased stool burden. No colonic wall thickening. Occasional sigmoid diverticula without  diverticulitis. Vascular/Lymphatic: Aortic atherosclerosis without aneurysm. No portal venous or mesenteric gas. No abdominopelvic adenopathy. Reproductive: Status post hysterectomy. No adnexal masses. Other: No free air, free fluid, or intra-abdominal fluid collection. Musculoskeletal: Surgical hardware in the thoracic spine is partially included. There are no acute or suspicious osseous abnormalities. IMPRESSION: 1. Distended urinary bladder. Mild prominence of the renal collecting systems, likely secondary to bladder distention. No bladder wall thickening or urinary tract inflammation. Recommend correlation with urinalysis. 2. Minimal sigmoid colonic diverticulosis without diverticulitis. Aortic Atherosclerosis (ICD10-I70.0). Electronically Signed   By: Keith Rake M.D.   On: 08/16/2020 18:09   DG Chest 2 View  Result Date: 08/16/2020 CLINICAL DATA:  Altered mental status EXAM: CHEST - 2 VIEW COMPARISON:  07/15/2019, 10/02/2018 FINDINGS: The heart size and mediastinal contours are  within normal limits. Both lungs are clear. Surgical hardware in the midthoracic region. IMPRESSION: No active cardiopulmonary disease. Electronically Signed   By: Donavan Foil M.D.   On: 08/16/2020 16:26   CT Head Wo Contrast  Result Date: 08/16/2020 CLINICAL DATA:  Mental status change EXAM: CT HEAD WITHOUT CONTRAST TECHNIQUE: Contiguous axial images were obtained from the base of the skull through the vertex without intravenous contrast. COMPARISON:  MRI 07/15/2019, CT brain 07/15/2019 FINDINGS: Brain: No acute territorial infarction, hemorrhage or intracranial mass. Mild atrophy. Stable ventricle size and configuration. Vascular: No hyperdense vessels.  No unexpected calcification Skull: Normal. Negative for fracture or focal lesion. Sinuses/Orbits: No acute finding. Other: None IMPRESSION: No CT evidence for acute intracranial abnormality.  Mild atrophy Electronically Signed   By: Donavan Foil M.D.   On: 08/16/2020  16:19    Procedures Procedures   Medications Ordered in ED Medications - No data to display  ED Course  I have reviewed the triage vital signs and the nursing notes.  Pertinent labs & imaging results that were available during my care of the patient were reviewed by me and considered in my medical decision making (see chart for details).  Clinical Course as of 08/16/20 2005  Tue Aug 16, 2020  1916 Consult: Reviewed with Dr. Quinn Axe neurology.  No acute strokelike symptoms.  Agrees with admission to medical service and further evaluation with consideration for baclofen or other medication reaction, consult neurology as needed once admitted. [MP]    Clinical Course User Index [MP] Charlesetta Shanks, MD   MDM Rules/Calculators/A&P                          Presents with altered mental status over about a week.  Patient has extremely poor recall and cannot complete thoughts midsentence.  There is not a focal neurologic deficit on exam.  She does exhibit a persistent tremor action of her right lower extremity.  This can extinguish activity.  She does not have tremor of the upper extremities.  At this time no sign of alcohol withdrawal.  Patient does not drink.  Sideration may be for medication effect with baclofen and Seroquel.  Also possibly stroke although atypical presentation and symptoms are at least a week in duration.  Plan for admission for mental status change unclear etiology.  Patient is anticoagulated and therapeutic.  No signs of intracranial bleed, or infectious etiology. Final Clinical Impression(s) / ED Diagnoses Final diagnoses:  Recurrent falls  Altered mental status, unspecified altered mental status type    Rx / DC Orders ED Discharge Orders     None        Charlesetta Shanks, MD 08/16/20 2007

## 2020-08-17 ENCOUNTER — Observation Stay (HOSPITAL_COMMUNITY): Payer: Medicare Other

## 2020-08-17 ENCOUNTER — Ambulatory Visit: Payer: Medicare Other | Admitting: Psychology

## 2020-08-17 ENCOUNTER — Encounter (HOSPITAL_COMMUNITY): Payer: Self-pay | Admitting: Internal Medicine

## 2020-08-17 DIAGNOSIS — G4700 Insomnia, unspecified: Secondary | ICD-10-CM | POA: Diagnosis not present

## 2020-08-17 DIAGNOSIS — R471 Dysarthria and anarthria: Secondary | ICD-10-CM | POA: Diagnosis not present

## 2020-08-17 DIAGNOSIS — E876 Hypokalemia: Secondary | ICD-10-CM | POA: Diagnosis not present

## 2020-08-17 DIAGNOSIS — F3181 Bipolar II disorder: Secondary | ICD-10-CM | POA: Diagnosis not present

## 2020-08-17 DIAGNOSIS — F4310 Post-traumatic stress disorder, unspecified: Secondary | ICD-10-CM | POA: Diagnosis not present

## 2020-08-17 DIAGNOSIS — G9340 Encephalopathy, unspecified: Secondary | ICD-10-CM | POA: Diagnosis not present

## 2020-08-17 DIAGNOSIS — R1110 Vomiting, unspecified: Secondary | ICD-10-CM | POA: Diagnosis present

## 2020-08-17 DIAGNOSIS — E785 Hyperlipidemia, unspecified: Secondary | ICD-10-CM | POA: Diagnosis not present

## 2020-08-17 DIAGNOSIS — F419 Anxiety disorder, unspecified: Secondary | ICD-10-CM | POA: Diagnosis not present

## 2020-08-17 DIAGNOSIS — Z6836 Body mass index (BMI) 36.0-36.9, adult: Secondary | ICD-10-CM | POA: Diagnosis not present

## 2020-08-17 DIAGNOSIS — M009 Pyogenic arthritis, unspecified: Secondary | ICD-10-CM | POA: Diagnosis not present

## 2020-08-17 DIAGNOSIS — Z8673 Personal history of transient ischemic attack (TIA), and cerebral infarction without residual deficits: Secondary | ICD-10-CM | POA: Diagnosis not present

## 2020-08-17 DIAGNOSIS — I480 Paroxysmal atrial fibrillation: Secondary | ICD-10-CM | POA: Diagnosis not present

## 2020-08-17 DIAGNOSIS — G8929 Other chronic pain: Secondary | ICD-10-CM | POA: Diagnosis not present

## 2020-08-17 DIAGNOSIS — E538 Deficiency of other specified B group vitamins: Secondary | ICD-10-CM | POA: Diagnosis not present

## 2020-08-17 DIAGNOSIS — G9341 Metabolic encephalopathy: Secondary | ICD-10-CM | POA: Diagnosis not present

## 2020-08-17 DIAGNOSIS — N898 Other specified noninflammatory disorders of vagina: Secondary | ICD-10-CM | POA: Diagnosis not present

## 2020-08-17 DIAGNOSIS — G459 Transient cerebral ischemic attack, unspecified: Secondary | ICD-10-CM | POA: Diagnosis not present

## 2020-08-17 DIAGNOSIS — M810 Age-related osteoporosis without current pathological fracture: Secondary | ICD-10-CM | POA: Diagnosis not present

## 2020-08-17 DIAGNOSIS — Z20822 Contact with and (suspected) exposure to covid-19: Secondary | ICD-10-CM | POA: Diagnosis not present

## 2020-08-17 DIAGNOSIS — G3184 Mild cognitive impairment, so stated: Secondary | ICD-10-CM | POA: Diagnosis not present

## 2020-08-17 DIAGNOSIS — I959 Hypotension, unspecified: Secondary | ICD-10-CM | POA: Diagnosis not present

## 2020-08-17 DIAGNOSIS — G253 Myoclonus: Secondary | ICD-10-CM | POA: Diagnosis not present

## 2020-08-17 DIAGNOSIS — Z66 Do not resuscitate: Secondary | ICD-10-CM | POA: Diagnosis not present

## 2020-08-17 DIAGNOSIS — E6609 Other obesity due to excess calories: Secondary | ICD-10-CM | POA: Diagnosis not present

## 2020-08-17 DIAGNOSIS — E039 Hypothyroidism, unspecified: Secondary | ICD-10-CM | POA: Diagnosis not present

## 2020-08-17 LAB — RESP PANEL BY RT-PCR (FLU A&B, COVID) ARPGX2
Influenza A by PCR: NEGATIVE
Influenza B by PCR: NEGATIVE
SARS Coronavirus 2 by RT PCR: NEGATIVE

## 2020-08-17 LAB — CBC
HCT: 40.8 % (ref 36.0–46.0)
Hemoglobin: 13.8 g/dL (ref 12.0–15.0)
MCH: 30.4 pg (ref 26.0–34.0)
MCHC: 33.8 g/dL (ref 30.0–36.0)
MCV: 89.9 fL (ref 80.0–100.0)
Platelets: 227 10*3/uL (ref 150–400)
RBC: 4.54 MIL/uL (ref 3.87–5.11)
RDW: 13.1 % (ref 11.5–15.5)
WBC: 11.7 10*3/uL — ABNORMAL HIGH (ref 4.0–10.5)
nRBC: 0 % (ref 0.0–0.2)

## 2020-08-17 LAB — PROTIME-INR
INR: 1.6 — ABNORMAL HIGH (ref 0.8–1.2)
Prothrombin Time: 19 seconds — ABNORMAL HIGH (ref 11.4–15.2)

## 2020-08-17 MED ORDER — PRAVASTATIN SODIUM 10 MG PO TABS
20.0000 mg | ORAL_TABLET | Freq: Every day | ORAL | Status: DC
  Administered 2020-08-17 – 2020-08-22 (×6): 20 mg via ORAL
  Filled 2020-08-17 (×6): qty 2

## 2020-08-17 MED ORDER — SODIUM CHLORIDE 0.9 % IV SOLN
20.0000 mg/kg | Freq: Once | INTRAVENOUS | Status: AC
  Administered 2020-08-17: 1620 mg via INTRAVENOUS
  Filled 2020-08-17: qty 16.2

## 2020-08-17 MED ORDER — QUETIAPINE FUMARATE 100 MG PO TABS
200.0000 mg | ORAL_TABLET | Freq: Every day | ORAL | Status: DC
  Administered 2020-08-17 – 2020-08-22 (×6): 200 mg via ORAL
  Filled 2020-08-17 (×6): qty 2

## 2020-08-17 MED ORDER — PREGABALIN 75 MG PO CAPS: 150.0000 mg | ORAL_CAPSULE | Freq: Three times a day (TID) | ORAL | Status: DC

## 2020-08-17 MED ORDER — ACETAMINOPHEN 160 MG/5ML PO SOLN: 650.0000 mg | ORAL | Status: DC | PRN

## 2020-08-17 MED ORDER — SENNOSIDES-DOCUSATE SODIUM 8.6-50 MG PO TABS: 1.0000 | ORAL_TABLET | Freq: Every evening | ORAL | Status: DC | PRN

## 2020-08-17 MED ORDER — STROKE: EARLY STAGES OF RECOVERY BOOK
Freq: Once | Status: AC
  Filled 2020-08-17: qty 1

## 2020-08-17 MED ORDER — WARFARIN - PHARMACIST DOSING INPATIENT: Freq: Every day | Status: DC

## 2020-08-17 MED ORDER — METOPROLOL SUCCINATE ER 50 MG PO TB24
50.0000 mg | ORAL_TABLET | Freq: Every day | ORAL | Status: DC
  Administered 2020-08-17 – 2020-08-21 (×5): 50 mg via ORAL
  Filled 2020-08-17 (×6): qty 1

## 2020-08-17 MED ORDER — PANTOPRAZOLE SODIUM 40 MG PO TBEC: 40.0000 mg | DELAYED_RELEASE_TABLET | Freq: Every day | ORAL | Status: DC

## 2020-08-17 MED ORDER — ASPIRIN 300 MG RE SUPP: 300.0000 mg | Freq: Every day | RECTAL | Status: DC

## 2020-08-17 MED ORDER — LORAZEPAM 2 MG/ML IJ SOLN
1.0000 mg | Freq: Once | INTRAMUSCULAR | Status: AC
  Administered 2020-08-17: 1 mg via INTRAVENOUS
  Filled 2020-08-17: qty 1

## 2020-08-17 MED ORDER — WARFARIN SODIUM 5 MG PO TABS: 5.0000 mg | ORAL_TABLET | Freq: Once | ORAL | Status: DC

## 2020-08-17 MED ORDER — PANTOPRAZOLE SODIUM 40 MG IV SOLR
40.0000 mg | Freq: Two times a day (BID) | INTRAVENOUS | Status: DC
  Administered 2020-08-17 – 2020-08-22 (×11): 40 mg via INTRAVENOUS
  Filled 2020-08-17 (×11): qty 40

## 2020-08-17 MED ORDER — MELATONIN 5 MG PO TABS
20.0000 mg | ORAL_TABLET | Freq: Every day | ORAL | Status: DC
  Administered 2020-08-17 – 2020-08-22 (×6): 20 mg via ORAL
  Filled 2020-08-17 (×7): qty 4

## 2020-08-17 MED ORDER — DOXYCYCLINE HYCLATE 100 MG PO TABS
100.0000 mg | ORAL_TABLET | Freq: Two times a day (BID) | ORAL | Status: DC
  Administered 2020-08-17 – 2020-08-23 (×13): 100 mg via ORAL
  Filled 2020-08-17 (×13): qty 1

## 2020-08-17 MED ORDER — SODIUM CHLORIDE 0.9 % IV SOLN: INTRAVENOUS | Status: DC

## 2020-08-17 MED ORDER — VERAPAMIL HCL ER 120 MG PO TBCR
120.0000 mg | EXTENDED_RELEASE_TABLET | Freq: Every day | ORAL | Status: DC
Start: 2020-08-17 — End: 2020-08-23
  Administered 2020-08-17 – 2020-08-22 (×6): 120 mg via ORAL
  Filled 2020-08-17 (×7): qty 1

## 2020-08-17 MED ORDER — SENNOSIDES-DOCUSATE SODIUM 8.6-50 MG PO TABS
2.0000 | ORAL_TABLET | Freq: Two times a day (BID) | ORAL | Status: DC
  Administered 2020-08-17 – 2020-08-23 (×8): 2 via ORAL
  Filled 2020-08-17 (×10): qty 2

## 2020-08-17 MED ORDER — ONDANSETRON HCL 4 MG/2ML IJ SOLN
INTRAMUSCULAR | Status: AC
  Administered 2020-08-17: 4 mg via INTRAVENOUS
  Filled 2020-08-17: qty 2

## 2020-08-17 MED ORDER — ACETAMINOPHEN 325 MG PO TABS
650.0000 mg | ORAL_TABLET | ORAL | Status: DC | PRN
  Administered 2020-08-19 (×2): 650 mg via ORAL
  Filled 2020-08-17 (×2): qty 2

## 2020-08-17 MED ORDER — ENOXAPARIN SODIUM 40 MG/0.4ML IJ SOSY
40.0000 mg | PREFILLED_SYRINGE | INTRAMUSCULAR | Status: DC
  Administered 2020-08-17: 40 mg via SUBCUTANEOUS
  Filled 2020-08-17: qty 0.4

## 2020-08-17 MED ORDER — WARFARIN SODIUM 4 MG PO TABS
4.0000 mg | ORAL_TABLET | Freq: Once | ORAL | Status: DC
  Filled 2020-08-17: qty 1

## 2020-08-17 MED ORDER — WARFARIN SODIUM 5 MG PO TABS
5.0000 mg | ORAL_TABLET | Freq: Once | ORAL | Status: AC
  Administered 2020-08-17: 5 mg via ORAL
  Filled 2020-08-17: qty 1

## 2020-08-17 MED ORDER — LEVOTHYROXINE SODIUM 25 MCG PO TABS
112.0000 ug | ORAL_TABLET | Freq: Every day | ORAL | Status: DC
  Administered 2020-08-19 – 2020-08-23 (×5): 112 ug via ORAL
  Filled 2020-08-17 (×6): qty 5

## 2020-08-17 MED ORDER — ASPIRIN 325 MG PO TABS
325.0000 mg | ORAL_TABLET | Freq: Every day | ORAL | Status: DC
  Administered 2020-08-17 – 2020-08-18 (×2): 325 mg via ORAL
  Filled 2020-08-17 (×2): qty 1

## 2020-08-17 MED ORDER — ACETAMINOPHEN 650 MG RE SUPP: 650.0000 mg | RECTAL | Status: DC | PRN

## 2020-08-17 MED ORDER — ONDANSETRON HCL 4 MG/2ML IJ SOLN
4.0000 mg | Freq: Four times a day (QID) | INTRAMUSCULAR | Status: DC | PRN
  Administered 2020-08-18 – 2020-08-20 (×4): 4 mg via INTRAVENOUS
  Filled 2020-08-17 (×4): qty 2

## 2020-08-17 NOTE — Progress Notes (Signed)
Unable to get gastroccult supplies to perform test. MD notified.

## 2020-08-17 NOTE — ED Notes (Signed)
Second attempt to call report was told that RN will call back, number given

## 2020-08-17 NOTE — ED Notes (Signed)
Pt is alert to self, but is unable to give her birthday, year or location. Was tolld in report that Pt. Has periods of confusion. Pt vitals appear baseline and is not in distress

## 2020-08-17 NOTE — ED Notes (Signed)
Report given to [facility] 

## 2020-08-17 NOTE — Progress Notes (Signed)
EEG complete - results pending 

## 2020-08-17 NOTE — ED Notes (Signed)
Visually chesked on pt and she stated that she was till doing ok

## 2020-08-17 NOTE — Progress Notes (Signed)
PT Cancellation Note  Patient Details Name: Meredith [name] MRN: [id number] DOB: [birth year]   Cancelled Treatment:    Reason Eval/Treat Not Completed: Other (comment) - awaiting further workup, possible neurology consult. PT to check back tomorrow.  Stacie Glaze, PT DPT Acute Rehabilitation Services Pager [phone]  Office [phone]    [name] Matte 08/17/2020, 2:20 PM

## 2020-08-17 NOTE — Progress Notes (Signed)
Started cEEG study. Notified [facility] monitoring.  Patient event button tested.

## 2020-08-17 NOTE — ED Notes (Signed)
Pt sneezed several time sna then threw up. Approx. 1 minutes later, Pt had another series of sneezes and thrrew up again. Pt states that she feels ok and vitals at Baseline for this Pt.

## 2020-08-17 NOTE — Progress Notes (Signed)
ANTICOAGULATION CONSULT NOTE - Initial Consult  Pharmacy Consult for warfarin  Indication: atrial fibrillation  Allergies  Allergen Reactions   Codeine Anaphylaxis    Patient states she can take codeine but not percocet    Adhesive [Tape]     blister   Celebrex [Celecoxib] Hives   Erythromycin Other (See Comments)   Other     Other reaction(s): MAKE HER CRAZY Other reaction(s): rash, high fever,welts Other reaction(s): rash Other reaction(s): severe diarrhea/vomiting Other reaction(s): Unknown   Penicillamine Diarrhea    Other reaction(s): Vomiting   Sulfa Antibiotics Hives   Tricyclic Antidepressants     Doesn't help   Amoxicillin Rash   Ampicillin Rash   Cephalexin Diarrhea   Penicillins Rash    Did it involve swelling of the face/tongue/throat, SOB, or low BP? Yes Did it involve sudden or severe rash/hives, skin peeling, or any reaction on the inside of your mouth or nose? NO Did you need to seek medical attention at a hospital or doctor's office? NO When did it last happen? childhood       If all above answers are "NO", may proceed with cephalosporin use.    Patient Measurements: Height: 4\' 11"  (149.9 cm) IBW/kg (Calculated) : 43.2  Vital Signs: Temp: 98.9 F (37.2 C) (06/22 1040) Temp Source: Axillary (06/22 1040) BP: 149/77 (06/22 1040) Pulse Rate: 74 (06/22 1040)  Labs: Recent Labs    08/16/20 1451 08/16/20 1556  HGB 13.9  --   HCT 42.6  --   PLT 224  --   LABPROT  --  23.0*  INR  --  2.0*  CREATININE 0.94  --     Estimated Creatinine Clearance: 51.3 mL/min (by C-G formula based on SCr of 0.94 mg/dL).   Medical History: Past Medical History:  Diagnosis Date   Anxiety    Atrial fibrillation ([facility])    Bipolar 2 disorder ([place])    Depression    Hematoma 07/06/2020   History of cardioversion    x2   Hyperlipidemia    Hypothyroidism    Migraine headache    Osteoporosis    Septic arthritis of interphalangeal joint of toe of right foot ([place])  07/06/2020    Medications:  Scheduled:   doxycycline  100 mg Oral Q12H   levothyroxine  112 mcg Oral Q0600   metoprolol succinate  50 mg Oral Daily   warfarin  4 mg Oral ONCE-1600   Warfarin - Pharmacist Dosing Inpatient   Does not apply q1600    Assessment: 7 yof presenting with AMS - on warfarin PTA for hx Afib. Still waiting to confirm PTA regimen - last appt on 6/9 regimen was 3.75 mg daily. INR on admission is therapeutic at 2.  INR today is subtherapeutic at 1.6. Unclear if took dose prior to coming to ED yesterday. Hgb 13.9, plt 224 yesterday. No s/sx of bleeding.   Goal of Therapy:  INR 2-3 Monitor platelets by anticoagulation protocol: Yes   Plan:  Warfarin 5 mg tonight Monitor daily INR and for s/sx of bleeding   Antonietta Jewel, PharmD, BCCCP Clinical Pharmacist  Phone: [phone] 08/17/2020 12:44 PM  Please check AMION for all [facility] phone numbers After 10:00 PM, call [place] [phone]

## 2020-08-17 NOTE — ED Notes (Signed)
Attempted to call report to [address], no answer, will attempt again

## 2020-08-17 NOTE — Procedures (Addendum)
Patient Name: Naryah Clenney  MRN: [id number]  Epilepsy Attending: Lora Havens  Referring Physician/Provider: Dr Karmen Bongo Date: 08/17/2020 Duration: 27.57 mins  Patient history: 70 y.o. female who presented to the ED with impaired communication. EEG to evaluate for seizure  Level of alertness: Awake  AEDs during EEG study: LEV  Technical aspects: This EEG study was done with scalp electrodes positioned according to the 10-20 International system of electrode placement. Electrical activity was acquired at a sampling rate of 500Hz  and reviewed with a high frequency filter of 70Hz  and a low frequency filter of 1Hz . EEG data were recorded continuously and digitally stored.   Description: The posterior dominant rhythm consists of 6 Hz activity of moderate voltage (25-35 uV) seen predominantly in posterior head regions, symmetric and reactive to eye opening and eye closing. EEG showed continuous generalized 5 to 6 Hz theta as well as intermittent generalized 2-3hz  delta slowing, at times with triphasic morphology. Hyperventilation and photic stimulation were not performed.     ABNORMALITY - Continuous slow, generalized - Background slow  IMPRESSION: This study is suggestive of moderate diffuse encephalopathy, nonspecific etiology. No seizures or definite epileptiform discharges were seen throughout the recording.  Esteen Delpriore Barbra Sarks

## 2020-08-17 NOTE — Progress Notes (Signed)
Pt is currently unavailable for her EEG. Pt is at MRI. Will attempt later when schedule permits

## 2020-08-17 NOTE — Consult Note (Signed)
Neurology Consultation  Reason for Consult: Impaired communication / aphasia Referring Physician: Dr. Lorin Mercy  CC: Impaired communication / aphasia  History is obtained from: Chart Review, Patient's Brother at Bedside  HPI: Meredith [name] is a 70 y.o. female with a medical history significant for atrial fibrillation on coumadin, bipolar disorder, hyperlipidemia, migraine headaches, and hypothyroidism who presented to the ED on 08/16/2020 for evaluation of speech difficulties. Per patient's twin brother at bedside, he last saw his sister 08/05/2020 when he took her for dinner and she was at her baseline mental status. He states he often checks in with her over the telephone but he went out of town last weekend. When he returned home he messaged her via text message without patient reply. Patient's brother states that on Tuesday, June 21st Meredith [name]'s pastor called her brother and let him know that Meredith [name] was unable to communicate and needed to go to the hospital. On hospital arrival, it was noted that Ms. Castille had right lower extremity involuntary myoclonic jerking.  At baseline, Meredith [name] lives independently, manages her own medications, drives herself, and ambulates with a cane. She had at one point ambulated with a walker but did not like to use it so she uses a cane. She is able to manage her own care but her brother states he often looks in on her and helps her get out of the house weekly by taking her to dinner.   LKW: 08/05/2020 tpa given?: no, last known well was approximately 12 days ago IR Thrombectomy? No, last known well was approximately 12 days ago.  Modified Rankin Scale: 3-Moderate disability-requires help but walks WITHOUT assistance  ROS: Unable to obtain due to altered mental status.   Past Medical History:  Diagnosis Date   Anxiety    Atrial fibrillation ([place])    Bipolar 2 disorder ([place])    Depression    Hematoma 07/06/2020   History of cardioversion    x2    Hyperlipidemia    Hypothyroidism    Migraine headache    Osteoporosis    Septic arthritis of interphalangeal joint of toe of right foot ([place]) 07/06/2020   Past Surgical History:  Procedure Laterality Date   ABLATION     BACK SURGERY  2014   CARDIOVERSION  12/2013, 01/2014   x2   KNEE SURGERY     ORTHOSCOPIC   LUNG REMOVAL, PARTIAL Right    BENIGN LUNG CYST   SYNOVIAL BIOPSY Right 06/16/2020   Procedure: Right hallux interphalangeal joint arthrotomy and biopsy;  Surgeon: [name], MD;  Location: [place];  Service: Orthopedics;  Laterality: Right;   THORACIC SPINE SURGERY Right    THUMB ARTHROSCOPY     Family History  Problem Relation Age of Onset   Arthritis Mother    Depression Mother    Diabetes Mother    Heart disease Mother    Stroke Mother    Early death Father    Heart disease Father    Atrial fibrillation Brother    Hyperlipidemia Brother    Multiple sclerosis Sister    Alcohol abuse Brother    Asthma Brother    Drug abuse Brother    Hypertension Brother    Mental illness Brother    Social History:   reports that she has never smoked. She has never used smokeless tobacco. She reports previous alcohol use. She reports that she does not use drugs.  Medications  Current Facility-Administered Medications:    0.9 %  sodium chloride infusion, , Intravenous, Continuous, Karmen Bongo, MD   acetaminophen (TYLENOL) tablet 650 mg, 650 mg, Oral, Q4H PRN **OR** acetaminophen (TYLENOL) 160 MG/5ML solution 650 mg, 650 mg, Per Tube, Q4H PRN **OR** acetaminophen (TYLENOL) suppository 650 mg, 650 mg, Rectal, Q4H PRN, Karmen Bongo, MD   aspirin suppository 300 mg, 300 mg, Rectal, Daily **OR** aspirin tablet 325 mg, 325 mg, Oral, Daily, Karmen Bongo, MD   doxycycline (VIBRA-TABS) tablet 100 mg, 100 mg, Oral, Q12H, Karmen Bongo, MD   enoxaparin (LOVENOX) injection 40 mg, 40 mg, Subcutaneous, Q24H, Karmen Bongo, MD   levothyroxine (SYNTHROID)  tablet 112 mcg, 112 mcg, Oral, Q0600, Karmen Bongo, MD   melatonin tablet 20 mg, 20 mg, Oral, QHS, Karmen Bongo, MD   metoprolol succinate (TOPROL-XL) 24 hr tablet 50 mg, 50 mg, Oral, Daily, Karmen Bongo, MD   Derrill Memo ON 08/18/2020] pantoprazole (PROTONIX) EC tablet 40 mg, 40 mg, Oral, Daily, Karmen Bongo, MD   pravastatin (PRAVACHOL) tablet 20 mg, 20 mg, Oral, QHS, Karmen Bongo, MD   QUEtiapine (SEROQUEL) tablet 200 mg, 200 mg, Oral, QHS, Karmen Bongo, MD   senna-docusate (Senokot-S) tablet 1 tablet, 1 tablet, Oral, QHS PRN, Karmen Bongo, MD   senna-docusate (Senokot-S) tablet 2 tablet, 2 tablet, Oral, BID, Karmen Bongo, MD   verapamil (CALAN-SR) CR tablet 120 mg, 120 mg, Oral, QHS, Karmen Bongo, MD   warfarin (COUMADIN) tablet 5 mg, 5 mg, Oral, ONCE-1600, Karmen Bongo, MD   Warfarin - Pharmacist Dosing Inpatient, , Does not apply, q1600, Karmen Bongo, MD  Exam: Current vital signs: BP (!) 149/77 (BP Location: Left Arm)   Pulse 74   Temp 98.9 F (37.2 C) (Axillary)   Resp 13   Ht 4\' 11"  (1.499 m)   LMP  (LMP Unknown)   SpO2 98%   BMI 36.17 kg/m  Vital signs in last 24 hours: Temp:  [98.7 F (37.1 C)-98.9 F (37.2 C)] 98.9 F (37.2 C) (06/22 1040) Pulse Rate:  [72-86] 74 (06/22 1040) Resp:  [8-19] 13 (06/22 1040) BP: (140-160)/(63-83) 149/77 (06/22 1040) SpO2:  [95 %-100 %] 98 % (06/22 1040)  GENERAL: Awake, alert, laying in bed, in no acute distress Psych: Calm and cooperative with examination Head: Normocephalic and atraumatic EENT: Edentulous, no OP obstruction, normal conjunctivae LUNGS: Normal respiratory effort. Non-labored breathing on room air CV: Irregular rhythm on cardiac monitor, extremities warm without edema ABDOMEN: Soft, non-distended Ext: warm, well perfused, no obvious deformity  NEURO:  Mental Status: Awake, alert, and able to state her first name. She is unable to answer further orientation questions. She states "yes" to  every yes / no question the examiner asks. She states "I do not know" when asked the month.  Speech is dysarthric but patient is edentulous. Naming is not intact with 0/5 objects identified. Repetition is intact. No neglect is noted. Cranial Nerves:  II: PERRL 3 mm/brisk. Visual fields full.  III, IV, VI: EOMI without ptosis V: Sensation is intact to light touch and symmetrical to face. VII: Face is symmetric resting and smiling.   VIII: Hearing is intact to voice IX, X: Palate elevation is symmetric. Phonation normal.  XI: Normal sternocleidomastoid and trapezius muscle strength XII: Tongue protrudes midline without fasciculations.   Motor: 5/5 strength is all muscle groups without vertical drift on assessment Tone is normal. Bulk is normal.  Right lower extremity myoclonic twitching of the ankle noted during examination. No clonus present on the right ankle. Sensation: Intact to light touch bilaterally in all four  extremities.  Coordination: No ataxia noted. DTRs: 2+ and symmetric biceps, 1+ and symmetric patellae Gait: deferred  Labs I have reviewed labs in epic and the results pertinent to this consultation are: CBC    Component Value Date/Time   WBC 9.9 08/16/2020 1451   RBC 4.65 08/16/2020 1451   HGB 13.9 08/16/2020 1451   HCT 42.6 08/16/2020 1451   HCT 36.0 10/23/2018 0414   PLT 224 08/16/2020 1451   MCV 91.6 08/16/2020 1451   MCH 29.9 08/16/2020 1451   MCHC 32.6 08/16/2020 1451   RDW 13.3 08/16/2020 1451   LYMPHSABS 1,553 08/05/2020 1457   MONOABS 0.9 07/15/2019 1150   EOSABS 210 08/05/2020 1457   BASOSABS 60 08/05/2020 1457   CMP     Component Value Date/Time   NA 141 08/16/2020 1451   NA 141 12/07/2019 1022   K 4.1 08/16/2020 1451   CL 103 08/16/2020 1451   CO2 26 08/16/2020 1451   GLUCOSE 103 (H) 08/16/2020 1451   BUN 11 08/16/2020 1451   BUN 15 12/07/2019 1022   CREATININE 0.94 08/16/2020 1451   CREATININE 1.02 (H) 08/05/2020 1457   CALCIUM 10.1  08/16/2020 1451   PROT 7.1 08/16/2020 1451   ALBUMIN 4.5 08/16/2020 1451   AST 14 (L) 08/16/2020 1451   ALT 10 08/16/2020 1451   ALKPHOS 56 08/16/2020 1451   BILITOT 0.8 08/16/2020 1451   GFRNONAA >60 08/16/2020 1451   GFRNONAA 56 (L) 08/05/2020 1457   GFRAA 65 08/05/2020 1457   Lipid Panel     Component Value Date/Time   CHOL 212 (H) 10/23/2018 0416   TRIG 103 10/23/2018 0416   HDL 94 10/23/2018 0416   CHOLHDL 2.3 10/23/2018 0416   VLDL 21 10/23/2018 0416   LDLCALC 97 10/23/2018 0416   Lab Results  Component Value Date   HGBA1C 5.8 (H) 07/16/2019   Imaging I have reviewed the images obtained:  CT-scan of the brain 08/16/2020: No CT evidence for acute intracranial abnormality.  Mild atrophy  MRI examination of the brain 08/17/2020:  MRI: 1. No evidence of acute intracranial abnormality. Specifically, no acute infarct. 2. Similar appearance of the brain in comparison to 2021 prior MRI, including mild ventriculomegaly.   MRA: No large vessel occlusion or proximal hemodynamically significant stenosis.  Assessment: 70 y.o. female with history as above who presented to the ED with impaired communication. Last seen at baseline functional and mental status by patient's twin brother 12 days ago on 08/05/2020.  - Examination reveals patient with word finding difficulties who is not oriented beyond her first name. She also has intermittent myoclonic jerking of the right ankle and right lower extremity below the knee.  - CT imaging is without evidence of acute intracranial abnormality. MRI imaging is without evidence of acute intracranial abnormality, MRA does not show LVO of hemodynamically significant stenosis.  - EEG applied and pending. LTM initiated. Loaded with Keppra 20 mg/kg for possible sharp waves identified on quick review. - DDx includes new onset seizure / status epilepticus induced aphasia versus stroke with aphasia, though less likely with CT and MRI imaging without  evidence of acute stroke.   Recommendations: - Keppra load 20 mg/kg IV - Inpatient seizure precautions - LTM EEG initiated   Pt seen by NP/Neuro and later by MD. Note/plan to be edited by MD as needed.  Anibal Henderson, AGAC-NP [facility] Pager: [phone]  NEUROHOSPITALIST ADDENDUM Performed a face to face diagnostic evaluation.   I have reviewed the  contents of history and physical exam as documented by PA/ARNP/Resident and agree with above documentation.  I have discussed and formulated the above plan as documented. Edits to the note have been made as needed.  Impression/Key exam findings/Plan: 70 y/o F with expressive aphasia. Last seen at her baseline ~12 days ago. MRI Brain is negative. LTM to evaluate for epileptiform discharges and rule out epileptic aphasia. Further workup pending LTM.  Donnetta Simpers, MD [facility] [phone]   If 7pm to 7am, please call on call as listed on AMION.

## 2020-08-17 NOTE — ED Notes (Signed)
Was told at report that Synthroid was not available to [facility]. Previous Rn notified Dr. Myna Hidalgo of same and consulted with Pharm.

## 2020-08-17 NOTE — H&P (Addendum)
History and Physical    Meredith [name] ELF:[id number] DOB: [birth year] DOA: 08/16/2020  PCP: Kristen Loader, FNP Consultants:  Tommy Medal - ID; Hewitt - orthopedics; Brahmhatt - GI; [name] - cardiology; [place] - psychiatry Patient coming from:  Home - lives alone; NOK: Brother, [phone]  Chief Complaint: AMS  HPI: Meredith [name] is a 70 y.o. female with medical history significant of afib on Coumadin; bipolar; HLD; hypothyroidism; and septic arthritis of the R toe presenting with AMS.  Her brother reports that can't put her thoughts together and communicate and her R leg is shaking.  Her brother was away over the weekend and texted and called when he came back without reply.  Her pastor recommended getting her evaluated.  She is alert and able to talk but can't really put thoughts together.  She was not having difficulty caring for herself.  He got a text from her Saturday for Father's Day but he hadn't actually spoken to her in almost 2 weeks.  No one had spoken to her in the interim.  She also has abdominal pain, fairly new, just TTP.  He is uncertain if her bipolar is usually controlled.  RN called to report an episode of emesis that was "dark red."  Patient otherwise hemodynamically stable.    ED Course: [facility] to [facility] transfer, per Dr. Myna Hidalgo: She lives alone, speaks with her brother frequently, and he has become concerned that she has been having trouble speaking and seems confused for the past week. Vitals stable, basic labs unremarkable, ammonia normal, lithium level low, head CT negative. Neuro (Dr. Livia Snellen) recommended admission for encephalopathy workup but does not plan to consult unless we request them to.   Review of Systems: As per HPI; otherwise review of systems reviewed and negative.   Ambulatory Status:  Ambulates with a cane  COVID Vaccine Status:   Complete plus 2 boosters  Past Medical History:  Diagnosis Date   Anxiety    Atrial fibrillation ([place])    Bipolar  2 disorder ([place])    Depression    Hematoma 07/06/2020   History of cardioversion    x2   Hyperlipidemia    Hypothyroidism    Migraine headache    Osteoporosis    Septic arthritis of interphalangeal joint of toe of right foot ([place]) 07/06/2020    Past Surgical History:  Procedure Laterality Date   ABLATION     BACK SURGERY  2014   CARDIOVERSION  12/2013, 01/2014   x2   KNEE SURGERY     ORTHOSCOPIC   LUNG REMOVAL, PARTIAL Right    BENIGN LUNG CYST   SYNOVIAL BIOPSY Right 06/16/2020   Procedure: Right hallux interphalangeal joint arthrotomy and biopsy;  Surgeon: [name], MD;  Location: [facility];  Service: Orthopedics;  Laterality: Right;   THORACIC SPINE SURGERY Right    THUMB ARTHROSCOPY      Social History   Socioeconomic History   Marital status: Single    Spouse name: Not on file   Number of children: 3   Years of education: Not on file   Highest education level: Not on file  Occupational History   Occupation: Retired  Tobacco Use   Smoking status: Never   Smokeless tobacco: Never  Vaping Use   Vaping Use: Not on file  Substance and Sexual Activity   Alcohol use: Not Currently   Drug use: No   Sexual activity: Not Currently  Other Topics Concern   Not on file  Social History Narrative   Not on file   Social Determinants of Health   Financial Resource Strain: Not on file  Food Insecurity: Not on file  Transportation Needs: Not on file  Physical Activity: Not on file  Stress: Not on file  Social Connections: Not on file  Intimate Partner Violence: Not on file    Allergies  Allergen Reactions   Codeine Anaphylaxis    Patient states she can take codeine but not percocet    Adhesive [Tape]     blister   Celebrex [Celecoxib] Hives   Erythromycin Other (See Comments)   Other     Other reaction(s): MAKE HER CRAZY Other reaction(s): rash, high fever,welts Other reaction(s): rash Other reaction(s): severe diarrhea/vomiting Other  reaction(s): Unknown   Penicillamine Diarrhea    Other reaction(s): Vomiting   Sulfa Antibiotics Hives   Tricyclic Antidepressants     Doesn't help   Amoxicillin Rash   Ampicillin Rash   Cephalexin Diarrhea   Macrodantin [Nitrofurantoin] Rash   Penicillins Rash    Did it involve swelling of the face/tongue/throat, SOB, or low BP? Yes Did it involve sudden or severe rash/hives, skin peeling, or any reaction on the inside of your mouth or nose? NO Did you need to seek medical attention at a hospital or doctor's office? NO When did it last happen? childhood       If all above answers are "NO", may proceed with cephalosporin use.    Family History  Problem Relation Age of Onset   Arthritis Mother    Depression Mother    Diabetes Mother    Heart disease Mother    Stroke Mother    Early death Father    Heart disease Father    Atrial fibrillation Brother    Hyperlipidemia Brother    Multiple sclerosis Sister    Alcohol abuse Brother    Asthma Brother    Drug abuse Brother    Hypertension Brother    Mental illness Brother     Prior to Admission medications   Medication Sig Start Date End Date Taking? Authorizing Provider  Melatonin 5 MG TABS Take 20 mg by mouth at bedtime.   Yes [provider]  senna-docusate (PERI-COLACE) 8.6-50 MG tablet Take 2 tablets by mouth 2 (two) times daily. For constipation 07/31/16  Yes Nwoko, Herbert Pun I, NP  ARIPiprazole (ABILIFY) 30 MG tablet Take 1 tablet (30 mg total) by mouth daily. 03/31/20   Cottle, Billey Co., MD  Ascorbic Acid (VITAMIN C) 1000 MG tablet Take 1,000 mg by mouth daily.    [provider]  aspirin 325 MG tablet Take 325 mg by mouth daily as needed for headache.     [provider]  baclofen (LIORESAL) 10 MG tablet Take 1 tablet by mouth as needed.    [provider]  bisacodyl (DULCOLAX) 5 MG EC tablet PRN 03/18/20   [provider]  Cholecalciferol (VITAMIN D3) 125 MCG (5000 UT) TABS Take 1  tablet by mouth daily at 12 noon.    [provider]  clotrimazole (LOTRIMIN) 1 % cream APPLY CREAM TOPICALLY TO AFFECTED AREA TWICE DAILY 08/03/19   [provider]  docusate sodium (COLACE) 100 MG capsule 1 capsule as needed    [provider]  doxycycline (VIBRA-TABS) 100 MG tablet Take 1 tablet (100 mg total) by mouth 2 (two) times daily. 07/11/20   Truman Hayward, MD  EPINEPHrine 0.3 mg/0.3 mL IJ SOAJ injection  07/07/19  [provider]  Erenumab-aooe (AIMOVIG) 140 MG/ML SOAJ Inject 140 mg into the skin every 30 (thirty) days.    [provider]  estradiol (ESTRACE) 1 MG tablet Take 1 mg by mouth daily. 01/27/19   [provider]  ferrous sulfate 325 (65 FE) MG tablet Take 325 mg by mouth 2 (two) times daily.    [provider]  fluticasone (FLONASE SENSIMIST) 27.5 MCG/SPRAY nasal spray Place 1 spray into the nose daily.    [provider]  Galcanezumab-gnlm (EMGALITY) 120 MG/ML SOAJ Inject into the skin.    [provider]  HYDROcodone-acetaminophen (NORCO) 7.5-325 MG tablet Take 1 tablet by mouth every 8 (eight) hours as needed for moderate pain.    [provider]  ipratropium (ATROVENT) 0.03 % nasal spray 1-2 sprays in each nostril 03/08/20   [provider]  lamoTRIgine (LAMICTAL) 100 MG tablet Take 1 tablet (100 mg total) by mouth 2 (two) times daily. 05/31/20   Cottle, Billey Co., MD  levocetirizine (XYZAL) 5 MG tablet Take 5 mg by mouth every evening. 07/07/19   [provider]  levothyroxine (SYNTHROID) 112 MCG tablet Take 112 mcg by mouth daily before breakfast.    [provider]  lithium carbonate 150 MG capsule Take 1 capsule (150 mg total) by mouth daily. Patient taking differently: Take 150 mg by mouth. Takes only on Monday,Wednesday,Friday. 01/28/20   Cottle, Billey Co., MD  lithium carbonate 300 MG capsule Take 1 capsule (300 mg total) by mouth at bedtime. 03/31/20    Cottle, Billey Co., MD  loperamide (IMODIUM) 2 MG capsule Take 1 capsule (2 mg total) by mouth as needed for diarrhea or loose stools. 07/20/19   Nita Sells, MD  metoprolol succinate (TOPROL-XL) 50 MG 24 hr tablet Take 1 tablet (50 mg total) by mouth daily. Take with or immediately following a meal. 02/29/20   [name], Wonda Cheng, MD  naloxone [facility]) 0.4 MG/ML injection  05/05/19   [provider]  ondansetron (ZOFRAN) 4 MG tablet Take 1 tablet (4 mg total) by mouth every 8 (eight) hours as needed for nausea or vomiting. 11/11/19   Evelina Bucy, DPM  pantoprazole (PROTONIX) 40 MG tablet Take 1 tablet (40 mg total) by mouth daily. 07/07/18   [place]ique, Betty G, MD  polyethylene glycol-electrolytes (NULYTELY) 420 g solution  03/18/20   [provider]  pravastatin (PRAVACHOL) 20 MG tablet Take 20 mg by mouth daily. 10/22/17   [provider]  pregabalin (LYRICA) 150 MG capsule Take 150 mg by mouth 3 (three) times daily.    [provider]  QUEtiapine (SEROQUEL) 200 MG tablet TAKE 1 TABLET BY MOUTH AT BEDTIME 06/13/20   Cottle, Billey Co., MD  verapamil (VERELAN PM) 120 MG 24 hr capsule Take 1 capsule (120 mg total) by mouth at bedtime. For high blood pressure 07/31/16   Lindell Spar I, NP  warfarin (COUMADIN) 2.5 MG tablet Take 1.5 - 2 tablets once daily or AS DIRECTED BY COUMADIN CLINIC Patient taking differently: Takes 1.5 tablets once daily or AS DIRECTED BY COUMADIN CLINIC 04/14/20   [name], Wonda Cheng, MD    Physical Exam: Vitals:   08/17/20 0700 08/17/20 0718 08/17/20 1040 08/17/20 1547  BP: (!) 150/66 (!) 150/66 (!) 149/77 (!) 148/66  Pulse: 76 75 74 69  Resp:  15 13 20   Temp:   98.9 F (37.2 C) 98.7 F (37.1 C)  TempSrc:   Axillary Axillary  SpO2: 96%  97% 98%   Height:         General:  Appears calm and comfortable and is in NAD Eyes:  PERRL, EOMI, normal lids, iris ENT:  grossly normal hearing, lips & tongue, mmm; edentulous Neck:  no LAD,  masses or thyromegaly Cardiovascular:  RRR, no m/r/g. No LE edema.  Respiratory:   CTA bilaterally with no wheezes/rales/rhonchi.  Normal respiratory effort. Abdomen:  soft, mildly diffusely TTP primarily in LLQ, ND Skin:  no rash or induration seen on limited exam Musculoskeletal:  grossly normal tone BUE/BLE, good ROM, no bony abnormality; R great toe with mild edema at the MTP joint but minimal erythema and no apparent TTP Psychiatric:  flat mood and affect, speech aphasic and sparse, mostly nonsensical when she is able to put together words Neurologic:  CN 2-12 grossly intact, moves all extremities in coordinated fashion, RLE twitching/tremor periodically throughout evaluation    Radiological Exams on Admission: Independently reviewed - see discussion in A/P where applicable  CT Abdomen Pelvis Wo Contrast  Result Date: 08/16/2020 CLINICAL DATA:  Abdominal distension, pain, and constipation. Altered mental status. EXAM: CT ABDOMEN AND PELVIS WITHOUT CONTRAST TECHNIQUE: Multidetector CT imaging of the abdomen and pelvis was performed following the standard protocol without IV contrast. COMPARISON:  CT 11/24/2017 FINDINGS: Lower chest: Mild linear lingular atelectasis or scar. No pleural fluid. Normal heart size. Hepatobiliary: Mild motion artifact through the liver and gallbladder. Allowing for this, no evidence of focal hepatic abnormality. Tiny low-density lesions in the liver on prior contrast-enhanced exam are not seen. No gallbladder inflammation or calcified gallstone. No biliary dilatation, although the common bile duct is not well-defined on the current exam. Pancreas: No ductal dilatation or inflammation. Spleen: Normal in size without focal abnormality. Adrenals/Urinary Tract: Normal adrenal glands. Slight prominence of the renal collecting systems felt to be due to prominently distended urinary bladder. No renal calculi. Trace chronic bilateral perinephric edema. Distended urinary  bladder without wall thickening. No focal bladder abnormality. Stomach/Bowel: Small hiatal hernia. Physiologically distended stomach. No small bowel obstruction or inflammation. Normal appendix. Small volume of colonic stool, no increased stool burden. No colonic wall thickening. Occasional sigmoid diverticula without diverticulitis. Vascular/Lymphatic: Aortic atherosclerosis without aneurysm. No portal venous or mesenteric gas. No abdominopelvic adenopathy. Reproductive: Status post hysterectomy. No adnexal masses. Other: No free air, free fluid, or intra-abdominal fluid collection. Musculoskeletal: Surgical hardware in the thoracic spine is partially included. There are no acute or suspicious osseous abnormalities. IMPRESSION: 1. Distended urinary bladder. Mild prominence of the renal collecting systems, likely secondary to bladder distention. No bladder wall thickening or urinary tract inflammation. Recommend correlation with urinalysis. 2. Minimal sigmoid colonic diverticulosis without diverticulitis. Aortic Atherosclerosis (ICD10-I70.0). Electronically Signed   By: Keith Rake M.D.   On: 08/16/2020 18:09   DG Chest 2 View  Result Date: 08/16/2020 CLINICAL DATA:  Altered mental status EXAM: CHEST - 2 VIEW COMPARISON:  07/15/2019, 10/02/2018 FINDINGS: The heart size and mediastinal contours are within normal limits. Both lungs are clear. Surgical hardware in the midthoracic region. IMPRESSION: No active cardiopulmonary disease. Electronically Signed   By: Donavan Foil M.D.   On: 08/16/2020 16:26   CT Head Wo Contrast  Result Date: 08/16/2020 CLINICAL DATA:  Mental status change EXAM: CT HEAD WITHOUT CONTRAST TECHNIQUE: Contiguous axial images were obtained from the base of the skull through the vertex without intravenous contrast. COMPARISON:  MRI 07/15/2019, CT brain 07/15/2019 FINDINGS: Brain: No acute territorial infarction, hemorrhage or intracranial mass. Mild atrophy. Stable ventricle  size and  configuration. Vascular: No hyperdense vessels.  No unexpected calcification Skull: Normal. Negative for fracture or focal lesion. Sinuses/Orbits: No acute finding. Other: None IMPRESSION: No CT evidence for acute intracranial abnormality.  Mild atrophy Electronically Signed   By: Donavan Foil M.D.   On: 08/16/2020 16:19   MR ANGIO HEAD WO CONTRAST  Result Date: 08/17/2020 CLINICAL DATA:  Transient ischemic attack. EXAM: MRI HEAD WITHOUT CONTRAST MRA HEAD WITHOUT CONTRAST TECHNIQUE: Multiplanar, multi-echo pulse sequences of the brain and surrounding structures were acquired without intravenous contrast. Angiographic images of the Circle of Willis were acquired using MRA technique without intravenous contrast. COMPARISON: No pertinent prior exam. COMPARISON:  Same day CT head.  MRI Jul 15, 2019. FINDINGS: MRI HEAD FINDINGS Mildly motion limited study.  Within this limitation: Brain: No acute infarction, hemorrhage, hydrocephalus, extra-axial collection or mass lesion. Similar mild ventriculomegaly without rounding of the temporal horns. Vascular: See below. Skull and upper cervical spine: Normal marrow signal. Sinuses/Orbits: Clear sinuses.  Unremarkable orbits. Other: No mastoid effusions. MRA HEAD FINDINGS Anterior circulation: Bilateral intracranial ICAs, MCAs, and ACAs are patent without evidence of proximal hemodynamically significant stenosis. No aneurysm identified. Posterior circulation: The visualized intradural vertebral arteries, basilar artery, and posterior cerebral arteries are patent without evidence of hemodynamically significant proximal stenosis. Left fetal type PCA, anatomic variant. No aneurysm identified. Anatomic variants: See above. IMPRESSION: MRI: 1. No evidence of acute intracranial abnormality. Specifically, no acute infarct. 2. Similar appearance of the brain in comparison to 2021 prior MRI, including mild ventriculomegaly. MRA: No large vessel occlusion or proximal hemodynamically  significant stenosis. Electronically Signed   By: Margaretha Sheffield MD   On: 08/17/2020 16:33   MR BRAIN WO CONTRAST  Result Date: 08/17/2020 CLINICAL DATA:  Transient ischemic attack. EXAM: MRI HEAD WITHOUT CONTRAST MRA HEAD WITHOUT CONTRAST TECHNIQUE: Multiplanar, multi-echo pulse sequences of the brain and surrounding structures were acquired without intravenous contrast. Angiographic images of the Circle of Willis were acquired using MRA technique without intravenous contrast. COMPARISON: No pertinent prior exam. COMPARISON:  Same day CT head.  MRI Jul 15, 2019. FINDINGS: MRI HEAD FINDINGS Mildly motion limited study.  Within this limitation: Brain: No acute infarction, hemorrhage, hydrocephalus, extra-axial collection or mass lesion. Similar mild ventriculomegaly without rounding of the temporal horns. Vascular: See below. Skull and upper cervical spine: Normal marrow signal. Sinuses/Orbits: Clear sinuses.  Unremarkable orbits. Other: No mastoid effusions. MRA HEAD FINDINGS Anterior circulation: Bilateral intracranial ICAs, MCAs, and ACAs are patent without evidence of proximal hemodynamically significant stenosis. No aneurysm identified. Posterior circulation: The visualized intradural vertebral arteries, basilar artery, and posterior cerebral arteries are patent without evidence of hemodynamically significant proximal stenosis. Left fetal type PCA, anatomic variant. No aneurysm identified. Anatomic variants: See above. IMPRESSION: MRI: 1. No evidence of acute intracranial abnormality. Specifically, no acute infarct. 2. Similar appearance of the brain in comparison to 2021 prior MRI, including mild ventriculomegaly. MRA: No large vessel occlusion or proximal hemodynamically significant stenosis. Electronically Signed   By: Margaretha Sheffield MD   On: 08/17/2020 16:33    EKG: Independently reviewed.  NSR with rate 79; no evidence of acute ischemia   Labs on Admission: I have personally reviewed the  available labs and imaging studies at the time of the admission.  Pertinent labs:   Unremarkable CMP Normal CBC Lithium 0.38 INR 2.0 UA unremarkable UDS + opiates COVID/flu negative   Assessment/Plan Principal Problem:   Acute encephalopathy Active Problems:   PAF (paroxysmal atrial fibrillation) ([place])  Bipolar disorder, curr episode mixed, severe, with psychotic features ([place])   Hypothyroidism   Class 2 obesity due to excess calories with body mass index (BMI) of 36.0 to 36.9 in adult   Septic arthritis of interphalangeal joint of toe of right foot ([facility])   Emesis   AMS -Patient presenting with encephalopathy as evidenced by her aphasia -This is a change compared to her usual baseline mental status -Uncertain when was her last normal, since family hasn't spoken to her in about 2 weeks and she lives in independent living -Evaluation thus far unremarkable -No evidence of infection; she has had R great toe infection (see below) but this appears to be stable currently -CVA is a consideration but head CT was negative; will obtain MRI to r/o but this would be an unusual presentation -Seizure is a consideration; she does have myoclonic jerking and tremor of the R foot -Medication misadventure is a consideration -Psychiatric disease is also a consideration -Based on unremarkable evaluation with current ability to protect her airway, will observe for now with IVF hydration and telemetry monitoring -50% of patients with delirium while hospitalized will be institutionalized at 6 months, and these patients have a 25% mortality at 6 months -The family would benefit from being referred to the [facility] and also provided with the [employer] website  -Continue Melatonin  Emesis -Concern for hematemesis by RN -No complaint of abdominal pain but she does have persistent TTP -CBC normal yesterday, will repeat -Will order gastroccult -Protonix IV BID also ordered -If  gastroccult is positive, will need to reverse INR and hold Coumadin  Afib -Rate control with Toprol XL, Verapamil -Continue Coumadin per pharmacy  Bipolar -Hold Abilify for now, continue Seroquel - should not be taking 2 antipsychotics -She takes Lamictal at home - seizure vs. Mood?; this is currently on hold -Hold Lithium - subtherapeutic level noted -Continue Lyrica for now  HLD -Continue Pravachol  Hypothyroidism -Continue Synthroid at current dose for now   Septic arthritis of the R great toe -Continue doxy through July, as per ID -She is taking MAB therapy - ?cause  Obesity -Body mass index is 36.17 kg/m..  -Weight loss should be encouraged -Outpatient PCP/bariatric medicine f/u encouraged      Note: This patient has been tested and is negative for the novel coronavirus COVID-19. The patient has been fully vaccinated against COVID-19.   Level of care: Telemetry DVT prophylaxis: Coumadin Code Status:  DNR - confirmed with patient/family Family Communication: Brother was present throughout evaluation. Disposition Plan:  The patient is from: home  Anticipated d/c is to: be determined  Anticipated d/c date will depend on clinical response to treatment, but possibly as early as tomorrow if she has excellent response to treatment  Patient is currently: acutely ill Consults called: Neurology; Nutrition; PT/OT/ST/TOC team  Admission status:  It is my clinical opinion that referral for OBSERVATION is reasonable and necessary in this patient based on the above information provided. The aforementioned taken together are felt to place the patient at high risk for further clinical deterioration. However it is anticipated that the patient may be medically stable for discharge from the hospital within 24 to 48 hours.    Karmen Bongo MD [facility]   How to contact the [facility] Attending or Consulting provider [place] or covering provider during after hours [place], for  this patient?  Check the care team in [facility] and look for a) attending/consulting [place] provider listed and b)  the [facility] team listed Log into [email or web address] and use [place]'s universal password to access. If you do not have the password, please contact the hospital operator. Locate the [facility] provider you are looking for under [facility] and page to a number that you can be directly reached. If you still have difficulty reaching the provider, please page the [facility] (Director on Call) for the Hospitalists listed on amion for assistance.   08/17/2020, 5:53 PM

## 2020-08-18 ENCOUNTER — Observation Stay (HOSPITAL_COMMUNITY): Payer: Medicare Other

## 2020-08-18 DIAGNOSIS — G253 Myoclonus: Secondary | ICD-10-CM | POA: Diagnosis present

## 2020-08-18 DIAGNOSIS — G894 Chronic pain syndrome: Secondary | ICD-10-CM | POA: Diagnosis not present

## 2020-08-18 DIAGNOSIS — I480 Paroxysmal atrial fibrillation: Secondary | ICD-10-CM | POA: Diagnosis present

## 2020-08-18 DIAGNOSIS — I361 Nonrheumatic tricuspid (valve) insufficiency: Secondary | ICD-10-CM

## 2020-08-18 DIAGNOSIS — I340 Nonrheumatic mitral (valve) insufficiency: Secondary | ICD-10-CM | POA: Diagnosis not present

## 2020-08-18 DIAGNOSIS — Z20822 Contact with and (suspected) exposure to covid-19: Secondary | ICD-10-CM | POA: Diagnosis present

## 2020-08-18 DIAGNOSIS — M810 Age-related osteoporosis without current pathological fracture: Secondary | ICD-10-CM | POA: Diagnosis present

## 2020-08-18 DIAGNOSIS — N898 Other specified noninflammatory disorders of vagina: Secondary | ICD-10-CM | POA: Diagnosis not present

## 2020-08-18 DIAGNOSIS — D649 Anemia, unspecified: Secondary | ICD-10-CM | POA: Diagnosis not present

## 2020-08-18 DIAGNOSIS — E039 Hypothyroidism, unspecified: Secondary | ICD-10-CM

## 2020-08-18 DIAGNOSIS — Z6836 Body mass index (BMI) 36.0-36.9, adult: Secondary | ICD-10-CM | POA: Diagnosis not present

## 2020-08-18 DIAGNOSIS — G4700 Insomnia, unspecified: Secondary | ICD-10-CM | POA: Diagnosis present

## 2020-08-18 DIAGNOSIS — M009 Pyogenic arthritis, unspecified: Secondary | ICD-10-CM | POA: Diagnosis present

## 2020-08-18 DIAGNOSIS — R4182 Altered mental status, unspecified: Secondary | ICD-10-CM | POA: Diagnosis present

## 2020-08-18 DIAGNOSIS — R1110 Vomiting, unspecified: Secondary | ICD-10-CM | POA: Diagnosis present

## 2020-08-18 DIAGNOSIS — R531 Weakness: Secondary | ICD-10-CM | POA: Diagnosis not present

## 2020-08-18 DIAGNOSIS — Z8673 Personal history of transient ischemic attack (TIA), and cerebral infarction without residual deficits: Secondary | ICD-10-CM | POA: Diagnosis not present

## 2020-08-18 DIAGNOSIS — R1111 Vomiting without nausea: Secondary | ICD-10-CM

## 2020-08-18 DIAGNOSIS — R262 Difficulty in walking, not elsewhere classified: Secondary | ICD-10-CM | POA: Diagnosis not present

## 2020-08-18 DIAGNOSIS — E538 Deficiency of other specified B group vitamins: Secondary | ICD-10-CM | POA: Diagnosis present

## 2020-08-18 DIAGNOSIS — G3184 Mild cognitive impairment, so stated: Secondary | ICD-10-CM | POA: Diagnosis present

## 2020-08-18 DIAGNOSIS — E785 Hyperlipidemia, unspecified: Secondary | ICD-10-CM | POA: Diagnosis present

## 2020-08-18 DIAGNOSIS — E876 Hypokalemia: Secondary | ICD-10-CM | POA: Diagnosis not present

## 2020-08-18 DIAGNOSIS — Z66 Do not resuscitate: Secondary | ICD-10-CM | POA: Diagnosis present

## 2020-08-18 DIAGNOSIS — E6609 Other obesity due to excess calories: Secondary | ICD-10-CM | POA: Diagnosis present

## 2020-08-18 DIAGNOSIS — G9340 Encephalopathy, unspecified: Secondary | ICD-10-CM | POA: Diagnosis present

## 2020-08-18 DIAGNOSIS — I959 Hypotension, unspecified: Secondary | ICD-10-CM | POA: Diagnosis not present

## 2020-08-18 DIAGNOSIS — F3181 Bipolar II disorder: Secondary | ICD-10-CM | POA: Diagnosis present

## 2020-08-18 DIAGNOSIS — F4310 Post-traumatic stress disorder, unspecified: Secondary | ICD-10-CM | POA: Diagnosis present

## 2020-08-18 DIAGNOSIS — F419 Anxiety disorder, unspecified: Secondary | ICD-10-CM | POA: Diagnosis present

## 2020-08-18 DIAGNOSIS — G9341 Metabolic encephalopathy: Secondary | ICD-10-CM | POA: Diagnosis present

## 2020-08-18 DIAGNOSIS — F3164 Bipolar disorder, current episode mixed, severe, with psychotic features: Secondary | ICD-10-CM | POA: Diagnosis not present

## 2020-08-18 DIAGNOSIS — Z741 Need for assistance with personal care: Secondary | ICD-10-CM | POA: Diagnosis not present

## 2020-08-18 DIAGNOSIS — K219 Gastro-esophageal reflux disease without esophagitis: Secondary | ICD-10-CM | POA: Diagnosis not present

## 2020-08-18 DIAGNOSIS — M6281 Muscle weakness (generalized): Secondary | ICD-10-CM | POA: Diagnosis not present

## 2020-08-18 DIAGNOSIS — R41841 Cognitive communication deficit: Secondary | ICD-10-CM | POA: Diagnosis not present

## 2020-08-18 DIAGNOSIS — R471 Dysarthria and anarthria: Secondary | ICD-10-CM | POA: Diagnosis present

## 2020-08-18 DIAGNOSIS — Z7401 Bed confinement status: Secondary | ICD-10-CM | POA: Diagnosis not present

## 2020-08-18 DIAGNOSIS — G8929 Other chronic pain: Secondary | ICD-10-CM | POA: Diagnosis present

## 2020-08-18 LAB — CBC
HCT: 39.5 % (ref 36.0–46.0)
Hemoglobin: 13 g/dL (ref 12.0–15.0)
MCH: 30.2 pg (ref 26.0–34.0)
MCHC: 32.9 g/dL (ref 30.0–36.0)
MCV: 91.9 fL (ref 80.0–100.0)
Platelets: 220 10*3/uL (ref 150–400)
RBC: 4.3 MIL/uL (ref 3.87–5.11)
RDW: 13.2 % (ref 11.5–15.5)
WBC: 11.1 10*3/uL — ABNORMAL HIGH (ref 4.0–10.5)
nRBC: 0 % (ref 0.0–0.2)

## 2020-08-18 LAB — LIPID PANEL
Cholesterol: 263 mg/dL — ABNORMAL HIGH (ref 0–200)
HDL: 111 mg/dL (ref >40)
LDL Cholesterol: 131 mg/dL — ABNORMAL HIGH (ref 0–99)
Total CHOL/HDL Ratio: 2.4 RATIO
Triglycerides: 106 mg/dL (ref <150)
VLDL: 21 mg/dL (ref 0–40)

## 2020-08-18 LAB — BASIC METABOLIC PANEL WITH GFR
Anion gap: 10 (ref 5–15)
BUN: 26 mg/dL — ABNORMAL HIGH (ref 8–23)
CO2: 24 mmol/L (ref 22–32)
Calcium: 9.2 mg/dL (ref 8.9–10.3)
Chloride: 106 mmol/L (ref 98–111)
Creatinine, Ser: 1.1 mg/dL — ABNORMAL HIGH (ref 0.44–1.00)
GFR, Estimated: 54 mL/min — ABNORMAL LOW (ref >60)
Glucose, Bld: 120 mg/dL — ABNORMAL HIGH (ref 70–99)
Potassium: 3.9 mmol/L (ref 3.5–5.1)
Sodium: 140 mmol/L (ref 135–145)

## 2020-08-18 LAB — LITHIUM LEVEL: Lithium Lvl: 0.17 mmol/L — ABNORMAL LOW (ref 0.60–1.20)

## 2020-08-18 LAB — RAPID HIV SCREEN (HIV 1/2 AB+AG)
HIV 1/2 Antibodies: NONREACTIVE
HIV-1 P24 Antigen - HIV24: NONREACTIVE

## 2020-08-18 LAB — ETHANOL: Alcohol, Ethyl (B): <10 mg/dL (ref <10)

## 2020-08-18 LAB — PROTIME-INR
INR: 1.7 — ABNORMAL HIGH (ref 0.8–1.2)
Prothrombin Time: 19.8 seconds — ABNORMAL HIGH (ref 11.4–15.2)

## 2020-08-18 LAB — TSH: TSH: 0.539 u[IU]/mL (ref 0.350–4.500)

## 2020-08-18 LAB — ECHOCARDIOGRAM COMPLETE
Height: 59 in
S' Lateral: 2.5 cm

## 2020-08-18 LAB — HEMOGLOBIN A1C
Hgb A1c MFr Bld: 5.9 % — ABNORMAL HIGH (ref 4.8–5.6)
Mean Plasma Glucose: 122.63 mg/dL

## 2020-08-18 LAB — SYPHILIS: RPR W/REFLEX TO RPR TITER AND TREPONEMAL ANTIBODIES, TRADITIONAL SCREENING AND DIAGNOSIS ALGORITHM: RPR Ser Ql: NONREACTIVE

## 2020-08-18 LAB — VITAMIN B12: Vitamin B-12: 167 pg/mL — ABNORMAL LOW (ref 180–914)

## 2020-08-18 LAB — HIV ANTIBODY (ROUTINE TESTING W REFLEX): HIV Screen 4th Generation wRfx: NONREACTIVE

## 2020-08-18 LAB — FOLATE: Folate: 17.4 ng/mL (ref >5.9)

## 2020-08-18 MED ORDER — WARFARIN SODIUM 5 MG PO TABS
5.0000 mg | ORAL_TABLET | Freq: Once | ORAL | Status: AC
  Administered 2020-08-18: 5 mg via ORAL
  Filled 2020-08-18: qty 1

## 2020-08-18 MED ORDER — ADULT MULTIVITAMIN W/MINERALS CH
1.0000 | ORAL_TABLET | Freq: Every day | ORAL | Status: DC
  Administered 2020-08-18 – 2020-08-20 (×3): 1 via ORAL
  Filled 2020-08-18 (×6): qty 1

## 2020-08-18 MED ORDER — CYANOCOBALAMIN 1000 MCG/ML IJ SOLN
1000.0000 ug | Freq: Once | INTRAMUSCULAR | Status: AC
  Administered 2020-08-18: 1000 ug via INTRAMUSCULAR
  Filled 2020-08-18: qty 1

## 2020-08-18 MED ORDER — VITAMIN B-12 1000 MCG PO TABS
1000.0000 ug | ORAL_TABLET | Freq: Every day | ORAL | Status: DC
  Administered 2020-08-19 – 2020-08-23 (×5): 1000 ug via ORAL
  Filled 2020-08-18 (×6): qty 1

## 2020-08-18 MED ORDER — THIAMINE HCL 100 MG/ML IJ SOLN
500.0000 mg | Freq: Three times a day (TID) | INTRAVENOUS | Status: AC
  Administered 2020-08-18 – 2020-08-19 (×6): 500 mg via INTRAVENOUS
  Filled 2020-08-18 (×6): qty 5

## 2020-08-18 MED ORDER — THIAMINE HCL 100 MG/ML IJ SOLN
250.0000 mg | Freq: Every day | INTRAVENOUS | Status: DC
  Administered 2020-08-20 – 2020-08-23 (×4): 250 mg via INTRAVENOUS
  Filled 2020-08-18 (×4): qty 2.5

## 2020-08-18 MED ORDER — THIAMINE HCL 100 MG/ML IJ SOLN: 100.0000 mg | Freq: Every day | INTRAMUSCULAR | Status: DC

## 2020-08-18 MED ORDER — ENSURE ENLIVE PO LIQD
237.0000 mL | Freq: Three times a day (TID) | ORAL | Status: DC
  Administered 2020-08-20 – 2020-08-21 (×3): 237 mL via ORAL

## 2020-08-18 NOTE — Evaluation (Signed)
Occupational Therapy Evaluation Patient Details Name: Meredith [name] MRN: [id number] DOB: [birth year] Today's Date: 08/18/2020    History of Present Illness 70 yo female presents to ED on 6/21 with AMS and aphasia. MRI and CT negative. 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  PMH includes bipolar disorder, paroxysmal atrial fibrillation treated with Coumadin, PTSD, stroke, osteoporosis, depression, DM.   Clinical Impression   PTA, pt was living alone and was independent; using a cane for mobility. Pt currently requiring Mod-Max A for ADLs and Mod A for functional mobility with RW. Pt presenting with poor cognition, balance, strength, and activity tolerance. During session, pt with bowel and bladder incontinence and requiring Max A and cues to complete peri care and bathing. Pt would benefit from further acute OT to facilitate safe dc. Recommend dc to SNF for further OT to optimize safety, independence with ADLs, and return to PLOF.      Follow Up Recommendations  SNF    Equipment Recommendations  None recommended by OT    Recommendations for Other Services PT consult;Speech consult     Precautions / Restrictions Precautions Precautions: Fall      Mobility Bed Mobility Overal bed mobility: Needs Assistance Bed Mobility: Sit to Supine     Supine to sit: Mod assist Sit to supine: Min assist   General bed mobility comments: Pt falling back into bed stating "woah". Min A for repositioning into supine safely.    Transfers Overall transfer level: Needs assistance Equipment used: Straight cane;1 person hand held assist Transfers: Sit to/from [facility] Sit to Stand: Min assist Stand pivot transfers: Min assist       General transfer comment: Min A for power up and gaininnig balance    Balance Overall balance assessment: Needs assistance Sitting-balance support: No upper extremity supported;Feet supported Sitting balance-Leahy Scale: Fair      Standing balance support: Bilateral upper extremity supported Standing balance-Leahy Scale: Poor Standing balance comment: reliant on UE support                           ADL either performed or assessed with clinical judgement   ADL Overall ADL's : Needs assistance/impaired Eating/Feeding: Set up;Sitting   Grooming: Oral care;Maximal assistance;Sitting;Wash/dry face;Standing Grooming Details (indicate cue type and reason): Pt standing at sink with Min Guard-Min A for balance. Max cues and hand over hand for sequencing and completing grooming tasks. Performing oral care while seated. repeating the question "what do I do now" at the end of each step within a task Upper Body Bathing: Moderate assistance;Sitting   Lower Body Bathing: Maximal assistance;Sit to/from stand   Upper Body Dressing : Moderate assistance;Sitting   Lower Body Dressing: Maximal assistance;Sit to/from stand   Toilet Transfer: Minimal assistance;+2 for safety/equipment;Ambulation;RW (simulated to upright chair)   Toileting- Clothing Manipulation and Hygiene: Maximal assistance;Sit to/from stand Toileting - Clothing Manipulation Details (indicate cue type and reason): Max A for peri care after bowel and urine incontenience.     Functional mobility during ADLs: Moderate assistance;Rolling walker (Balance and RW management) General ADL Comments: Pt presenting with poor balance, strength, cogntiion, and activity tolerance.     Vision         Perception     Praxis      Pertinent Vitals/Pain Pain Assessment: Faces Faces Pain Scale: No hurt Pain Intervention(s): Monitored during session     Hand Dominance Right   Extremity/Trunk Assessment Upper Extremity Assessment Upper Extremity Assessment:  Generalized weakness;LUE deficits/detail LUE Deficits / Details: Decreased attention and use of LUE. Also tendency forpushing LUE into extension   Lower Extremity Assessment Lower Extremity  Assessment: Defer to PT evaluation   Cervical / Trunk Assessment Cervical / Trunk Assessment: Kyphotic   Communication Communication Communication: Expressive difficulties (Requring significant time; difficulty ending sentences)   Cognition Arousal/Alertness: Awake/alert Behavior During Therapy: Flat affect Overall Cognitive Status: Impaired/Different from baseline Area of Impairment: Attention;Memory;Following commands;Problem solving                   Current Attention Level: Sustained Memory: Decreased short-term memory Following Commands: Follows one step commands inconsistently;Follows one step commands with increased time     Problem Solving: Slow processing;Decreased initiation;Difficulty sequencing;Requires verbal cues;Requires tactile cues     General Comments  Brother present at begining and end of session. VSS on RA    Exercises     Shoulder Instructions      Home Living Family/patient expects to be discharged to:: Private residence Living Arrangements: Alone Available Help at Discharge: Family;Available 24 hours/day (brother checks in with pt) Type of Home: Apartment Home Access: Level entry     Home Layout: One level     Bathroom Shower/Tub: Teacher, early years/pre: Standard     Home Equipment: Grab bars - tub/shower;Cane - single point;Walker - 2 wheels   Additional Comments: information from prior encounter      Prior Functioning/Environment Level of Independence: Independent with assistive device(s)        Comments: ADLs, IADLs, drives; uses a cane for mobility        OT Problem List: Decreased strength;Decreased range of motion;Decreased activity tolerance;Impaired balance (sitting and/or standing);Decreased cognition;Decreased safety awareness;Decreased knowledge of use of DME or AE      OT Treatment/Interventions: Self-care/ADL training;Therapeutic exercise;Energy conservation;DME and/or AE instruction;Therapeutic  activities;Patient/family education    OT Goals(Current goals can be found in the care plan section) Acute Rehab OT Goals Patient Stated Goal: Return to PLOF OT Goal Formulation: With patient/family Time For Goal Achievement: 09/01/20 Potential to Achieve Goals: Good  OT Frequency: Min 2X/week   Barriers to D/C:            Co-evaluation              AM-PAC OT "6 Clicks" Daily Activity     Outcome Measure Help from another person eating meals?: A Little Help from another person taking care of personal grooming?: A Lot Help from another person toileting, which includes using toliet, bedpan, or urinal?: A Lot Help from another person bathing (including washing, rinsing, drying)?: A Lot Help from another person to put on and taking off regular upper body clothing?: A Lot Help from another person to put on and taking off regular lower body clothing?: A Lot 6 Click Score: 13   End of Session Equipment Utilized During Treatment: Gait belt;Rolling walker Nurse Communication: Mobility status  Activity Tolerance: Patient tolerated treatment well Patient left: in bed;with call bell/phone within reach;with bed alarm set;with family/visitor present  OT Visit Diagnosis: Unsteadiness on feet (R26.81);Other abnormalities of gait and mobility (R26.89);Muscle weakness (generalized) (M62.81)                Time: 8938-1017 OT Time Calculation (min): 35 min Charges:  OT General Charges $OT Visit: 1 Visit OT Evaluation $OT Eval Moderate Complexity: 1 Mod OT Treatments $Self Care/Home Management : 8-22 mins  [place], OTR/L Acute Rehab Pager: [phone] Office: [phone]  Lianne Bushy  M Abriel Geesey 08/18/2020, 4:13 PM

## 2020-08-18 NOTE — Procedures (Signed)
Patient Name: Domitila Stetler  MRN: [id number]  Epilepsy Attending: Lora Havens  Referring Physician/Provider: Dr Donnetta Simpers Duration: 08/17/2020 1851 to 08/18/2020 0902   Patient history: 70 y.o. female who presented to the ED with impaired communication. EEG to evaluate for seizure   Level of alertness: Awake   AEDs during EEG study: LEV   Technical aspects: This EEG study was done with scalp electrodes positioned according to the 10-20 International system of electrode placement. Electrical activity was acquired at a sampling rate of 500Hz  and reviewed with a high frequency filter of 70Hz  and a low frequency filter of 1Hz . EEG data were recorded continuously and digitally stored.   Description: The posterior dominant rhythm consists of 6 Hz activity of moderate voltage (25-35 uV) seen predominantly in posterior head regions, symmetric and reactive to eye opening and eye closing. EEG showed continuous generalized 5 to 6 Hz theta as well as intermittent generalized 2-3hz  delta slowing, at times with triphasic morphology. Hyperventilation and photic stimulation were not performed.      ABNORMALITY - Continuous slow, generalized - Background slow   IMPRESSION: This study is suggestive of moderate diffuse encephalopathy, nonspecific etiology. No seizures or definite epileptiform discharges were seen throughout the recording.   Jemario Poitras Barbra Sarks

## 2020-08-18 NOTE — Evaluation (Signed)
Physical Therapy Evaluation Patient Details Name: Meredith [name] MRN: [id number] DOB: Nov 29, 1950 Today's Date: 08/18/2020   History of Present Illness  70 yo female presents to ED on 6/21 with AMS and aphasia. MRI and CT negative. Neuro work up felt like functional disorder .  PMH includes bipolar disorder, paroxysmal atrial fibrillation treated with Coumadin, PTSD, stroke, osteoporosis, depression, DM.  Clinical Impression  Pt presents to PT with decr mobility due to decr cognition. balance, and strength. Pt lives alone and is unable to care for herself at this time. Recommend SNF. Expect mobility to improve if cognition improves.     Follow Up Recommendations SNF    Equipment Recommendations  Rolling walker with 5" wheels     Recommendations for Other Services       Precautions / Restrictions Precautions Precautions: Fall      Mobility  Bed Mobility Overal bed mobility: Needs Assistance Bed Mobility: Supine to Sit;Sit to Supine     Supine to sit: Mod assist     General bed mobility comments: Assist to initiate legs off of bed and to elevate trunk into sitting    Transfers Overall transfer level: Needs assistance Equipment used: Straight cane;1 person hand held assist Transfers: Sit to/from [facility] Sit to Stand: Min assist Stand pivot transfers: Min assist       General transfer comment: Assist to bring hips up and for balance. Pt incontinent of stool. After pt cleaned performed bed to chair with stand pivot with bilateral shelf arm support  Ambulation/Gait             General Gait Details: Did not attempt after extended standing for pericare and pt fatigued  Stairs            Wheelchair Mobility    Modified Rankin (Stroke Patients Only)       Balance Overall balance assessment: Needs assistance Sitting-balance support: No upper extremity supported;Feet supported Sitting balance-Leahy Scale: Fair     Standing  balance support: Bilateral upper extremity supported Standing balance-Leahy Scale: Poor Standing balance comment: Cane, hand held and min assist                             Pertinent Vitals/Pain Pain Assessment: Faces Faces Pain Scale: No hurt    Home Living Family/patient expects to be discharged to:: Private residence Living Arrangements: Alone Available Help at Discharge: Family;Available 24 hours/day (brother checks in with pt) Type of Home: Apartment Home Access: Level entry     Home Layout: One level Home Equipment: Grab bars - tub/shower;Cane - single point;Walker - 2 wheels Additional Comments: information from prior encounter    Prior Function Level of Independence: Independent with assistive device(s)         Comments: uses cane, drives     Hand Dominance   Dominant Hand: Right    Extremity/Trunk Assessment   Upper Extremity Assessment Upper Extremity Assessment: Defer to OT evaluation    Lower Extremity Assessment Lower Extremity Assessment: Generalized weakness       Communication      Cognition Arousal/Alertness: Awake/alert Behavior During Therapy: Flat affect Overall Cognitive Status: Impaired/Different from baseline Area of Impairment: Attention;Memory;Following commands;Problem solving                   Current Attention Level: Sustained Memory: Decreased short-term memory Following Commands: Follows one step commands inconsistently;Follows one step commands with increased time     Problem  Solving: Slow processing;Decreased initiation;Difficulty sequencing;Requires verbal cues;Requires tactile cues        General Comments      Exercises     Assessment/Plan    PT Assessment Patient needs continued PT services  PT Problem List Decreased strength;Decreased balance;Decreased activity tolerance;Decreased mobility;Decreased cognition       PT Treatment Interventions DME instruction;Gait training;Functional  mobility training;Therapeutic activities;Therapeutic exercise;Balance training;Patient/family education;Cognitive remediation    PT Goals (Current goals can be found in the Care Plan section)  Acute Rehab PT Goals Patient Stated Goal: pt unable PT Goal Formulation: Patient unable to participate in goal setting Time For Goal Achievement: 09/01/20 Potential to Achieve Goals: Good    Frequency Min 3X/week   Barriers to discharge Decreased caregiver support lives alone    Co-evaluation               AM-PAC PT "6 Clicks" Mobility  Outcome Measure Help needed turning from your back to your side while in a flat bed without using bedrails?: A Lot Help needed moving from lying on your back to sitting on the side of a flat bed without using bedrails?: A Lot Help needed moving to and from a bed to a chair (including a wheelchair)?: A Little Help needed standing up from a chair using your arms (e.g., wheelchair or bedside chair)?: A Little Help needed to walk in hospital room?: Total Help needed climbing 3-5 steps with a railing? : Total 6 Click Score: 12    End of Session Equipment Utilized During Treatment: Gait belt Activity Tolerance: Patient limited by fatigue Patient left: in chair;with nursing/sitter in room (nurse techs completing bath) Nurse Communication: Mobility status PT Visit Diagnosis: Other abnormalities of gait and mobility (R26.89)    Time: 8185-6314 PT Time Calculation (min) (ACUTE ONLY): 26 min   Charges:   PT Evaluation $PT Eval Moderate Complexity: 1 Mod PT Treatments $Therapeutic Activity: 8-22 mins        [place] Pager [phone] Office [place] 08/18/2020, 1:58 PM

## 2020-08-18 NOTE — Progress Notes (Signed)
Neurology Progress Note  Brief HPI: 70 y.o. with PMHx AF on coumadin, bipolar disorder, HLD, migraine headaches, and hypothyroidism who presented to the ED 08/16/2020 for aphasia / impaired communication and right lower extremity myoclonic jerking with last known well 12 days prior to admission.   Subjective: No acute overnight events No myoclonic jerking noted on examination this morning Waxing / waning status per brother at bedside EEG overnight suggestive of moderate diffuse encephalopathy, nonspecific etiology without seizures or epileptiform discharges.   Exam: Vitals:   08/18/20 0400 08/18/20 0720  BP: (!) 149/85 128/62  Pulse: 72 66  Resp: 15 15  Temp: 98.9 F (37.2 C) 98.3 F (36.8 C)  SpO2: 96% 96%   Gen: Laying comfortably in bed, in no acute distress Resp: non-labored breathing, no respiratory distress, Spo2 in the 90's on cardiac monitor Abd: soft, non-distended  NEURO: Mental Status: Awake, alert, and able to state her full name when asked twice. Initially she states "my name is" and then does not continue to speak. Per brother, she has been able to state her name without difficulty multiple other times this morning.  She is unable to answer further orientation questions.  When asked how she is feeling she states "it is what it is".  She states "I am having trouble remembering things" When asked her age and the current year she states "I don't know" When asked the month she does not answer but is able to state that it is summer time.  Speech is dysarthric but patient is edentulous.  Naming is not intact with 0/5 objects identified. She states "who knows" when asked to identify examiner's pen. Repetition is intermittently intact and she is able to repeat 1/2 phrases without error. Does not attempt to repeat second phrase.  No neglect is noted. Cranial Nerves: II: PERRL 3 mm/brisk. Visual fields full. III, IV, VI: EOMI without ptosis V: Sensation is intact to light  touch and symmetrical to face. VII: Face is symmetric resting and smiling.   VIII: Hearing is intact to voice IX, X: Palate elevation is symmetric. Phonation normal. XI: Normal sternocleidomastoid and trapezius muscle strength XII: Tongue protrudes midline without fasciculations.   Motor: 5/5 strength is all muscle groups without vertical drift on assessment Tone is normal. Bulk is normal.  No myoclonic jerking noted on examination, voluntary twitching of right ankle noted during lower extremity assessment that resolves with distraction.  Sensation: Intact to light touch bilaterally in all four extremities. DTRs: 2+ and symmetric biceps Gait: Deferred  Pertinent Labs: CBC    Component Value Date/Time   WBC 11.1 (H) 08/18/2020 0010   RBC 4.30 08/18/2020 0010   HGB 13.0 08/18/2020 0010   HCT 39.5 08/18/2020 0010   HCT 36.0 10/23/2018 0414   PLT 220 08/18/2020 0010   MCV 91.9 08/18/2020 0010   MCH 30.2 08/18/2020 0010   MCHC 32.9 08/18/2020 0010   RDW 13.2 08/18/2020 0010   LYMPHSABS 1,553 08/05/2020 1457   MONOABS 0.9 07/15/2019 1150   EOSABS 210 08/05/2020 1457   BASOSABS 60 08/05/2020 1457   CMP     Component Value Date/Time   NA 141 08/16/2020 1451   NA 141 12/07/2019 1022   K 4.1 08/16/2020 1451   CL 103 08/16/2020 1451   CO2 26 08/16/2020 1451   GLUCOSE 103 (H) 08/16/2020 1451   BUN 11 08/16/2020 1451   BUN 15 12/07/2019 1022   CREATININE 0.94 08/16/2020 1451   CREATININE 1.02 (H) 08/05/2020 1457  CALCIUM 10.1 08/16/2020 1451   PROT 7.1 08/16/2020 1451   ALBUMIN 4.5 08/16/2020 1451   AST 14 (L) 08/16/2020 1451   ALT 10 08/16/2020 1451   ALKPHOS 56 08/16/2020 1451   BILITOT 0.8 08/16/2020 1451   GFRNONAA >60 08/16/2020 1451   GFRNONAA 56 (L) 08/05/2020 1457   GFRAA 65 08/05/2020 1457   Urinalysis    Component Value Date/Time   COLORURINE YELLOW 08/16/2020 1814   APPEARANCEUR CLEAR 08/16/2020 1814   LABSPEC 1.011 08/16/2020 1814   PHURINE 7.0 08/16/2020  1814   GLUCOSEU NEGATIVE 08/16/2020 1814   HGBUR NEGATIVE 08/16/2020 1814   BILIRUBINUR NEGATIVE 08/16/2020 1814   KETONESUR TRACE (A) 08/16/2020 1814   PROTEINUR NEGATIVE 08/16/2020 1814   NITRITE NEGATIVE 08/16/2020 1814   LEUKOCYTESUR TRACE (A) 08/16/2020 1814   Lab Results  Component Value Date   TSH 0.341 (L) 07/15/2019   Lab Results  Component Value Date   VITAMINB12 167 (L) 08/18/2020   HIV 1/2 Antibodies 08/18/2020: Non Reactive  Imaging Reviewed:  CT-scan of the brain 08/16/2020: No CT evidence for acute intracranial abnormality.  Mild atrophy   MRI examination of the brain 08/17/2020: MRI: 1. No evidence of acute intracranial abnormality. Specifically, no acute infarct. 2. Similar appearance of the brain in comparison to 2021 prior MRI, including mild ventriculomegaly.   MRA 08/17/2020: No large vessel occlusion or proximal hemodynamically significant stenosis  EEG overnight 08/17/2020 - 08/18/2020: "This study is suggestive of moderate diffuse encephalopathy, nonspecific etiology. No seizures or definite epileptiform discharges were seen throughout the recording."  Assessment: 70 y.o. female with history as above who presented to the ED with impaired communication. Last seen at baseline functional and mental status by patient's twin brother 12 days prior to admission on 08/05/2020. - Examination with continued word finding difficulties with waxing / waning deficits per brother at bedside and without further myoclonic jerking. During assessment of lower extremities, voluntary twitching of the right ankle noted that resolves with distraction.  - CT imaging is without evidence of acute intracranial abnormality. MRI imaging is without evidence of acute intracranial abnormality, MRA does not show LVO of hemodynamically significant stenosis. - Initial EEG review by neurologist was felt to be artifact. Formal EEG reading suggestive of moderate diffuse encephalopathy but without  seizures or epileptiform discharges. No further AED therapy indicated at this time. LTM EEG discontinued.  - encephalopathy Labs with low Vit B12. Thiamine pending.  Recommendations:  1)Cynocobalamin 1000mg  IM today, followed by 1000mg  PO daily 2)Empiric thiamine replacement, high dose. Can switch to PO 100mg  daily at discharge. 3)No further neurologic workup recommended. 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  Anibal Henderson, AGACNP-BC [facility] [phone]  NEUROHOSPITALIST ADDENDUM Performed a face to face diagnostic evaluation.   I have reviewed the contents of history and physical exam as documented by PA/ARNP/Resident and agree with above documentation.  I have discussed and formulated the above plan as documented. Edits to the note have been made as needed.  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 Brother sitting at the bedside, was able to see her talk to him just prior to my arrival. When I asked her, she does not know what his name is. but just a minute later calls him by his name to ask him a question. She I do not think this is aphemia or speech apraxia. She  is able to repeat and able to comprehend speech fine.  Workup with MRI Brain with no acute abnormality, cEEG with no epileptiform discharges, no focal slowing. Notable for moderate diffuse slowing.  Encephalopathy labs with low Vit B12 and started on replacement. Will do empiric high dose thiamine replacement too until levels come back. Can switch her to PO 100mg  daily at discharge.  Likely her current presentation is functional. I do not have a clear organic cause for her presentation at this time. We will signoff. Please feel free to contact us with any questions or concerns.  Discussed with Dr. Ree Kida over  phone.  Donnetta Simpers, MD [facility] [phone]   If 7pm to 7am, please call on call as listed on AMION.

## 2020-08-18 NOTE — Progress Notes (Signed)
PROGRESS NOTE    Meredith [name]  WEX:[id number] DOB: 1950-10-12 DOA: 08/16/2020 PCP: Kristen Loader, FNP   Brief Narrative:  HPI on 08/17/2020 by Dr. Lowell Guitar Meredith [name] is a 70 y.o. female with medical history significant of afib on Coumadin; bipolar; HLD; hypothyroidism; and septic arthritis of the R toe presenting with AMS.  Her brother reports that can't put her thoughts together and communicate and her R leg is shaking.  Her brother was away over the weekend and texted and called when he came back without reply.  Her pastor recommended getting her evaluated.  She is alert and able to talk but can't really put thoughts together.  She was not having difficulty caring for herself.  He got a text from her Saturday for Father's Day but he hadn't actually spoken to her in almost 2 weeks.  No one had spoken to her in the interim.  She also has abdominal pain, fairly new, just TTP.  He is uncertain if her bipolar is usually controlled.   RN called to report an episode of emesis that was "dark red."  Patient otherwise hemodynamically stable.   Interim history Admitted with altered mental status.  CT and MRI unremarkable.  EEG obtained showing no seizure activity.  Neurology feels this may be psychiatric in nature.  Lithium level subtherapeutic.  Psychiatry consulted. Assessment & Plan   Acute metabolic encephalopathy -Patient presented with expressive aphasia.  Brother at bedside and feels that her mental status has mildly improved but not back to baseline. -Patient continues to have expressive aphasia, appears to comprehend well. -CT head unremarkable -MRI brain also unremarkable for acute findings -EEG showed moderate diffuse encephalopathy, nonspecific etiology.  No seizures or definite elliptic form discharges seen during recording. -UA and chest x-ray unremarkable for infection -Patient does have history of septic arthritis of the right great toe for which she is  currently taking doxycycline -Neurology consulted and appreciated-given that there is no organic cause for altered mental status, feels this may be more psychiatric in nature -Psychiatry consulted and appreciated -Lithium level subtherapeutic, 0.17  Emesis -Noted by nursing -Patient currently denies any abdominal pain, nausea or vomiting -Gastroccult was ordered however has yet to be obtained -Continue Protonix  Atrial fibrillation -Continue metoprolol -Continue Coumadin, per pharmacy-currently subtherapeutic  Vitamin B12 deficiency -B12 167, will replace  Bipolar disorder -Abilify and lithium as well as Lamictal currently held -Continue Seroquel and Lyrica -Psychiatry consulted and appreciated  Hyperlipidemia -Continue statin  Hypothyroidism -Continue Synthroid  Right great toe septic arthritis -Appears stable, continue doxycycline  Obesity -BMI of 36.17 -Patient needs to follow-up with her PCP for lifestyle modifications   DVT Prophylaxis Coumadin  Code Status: DNR  Family Communication: Brother at bedside  Disposition Plan:  Status is: Observation  The patient will require care spanning > 2 midnights and should be moved to inpatient because: Altered mental status  Dispo: The patient is from: Home              Anticipated d/c is to: Home              Patient currently is not medically stable to d/c.   Difficult to place patient No   Consultants Neurology  Procedures  EEG Echocardiogram  Antibiotics   Anti-infectives (From admission, onward)    Start     Dose/Rate Route Frequency Ordered Stop   08/17/20 1330  doxycycline (VIBRA-TABS) tablet 100 mg        100 mg  Oral Every 12 hours 08/17/20 1240         Subjective:   Meredith [name] seen and examined today.  Continues to have some expressive aphasia.  Denies current chest pain or shortness of breath, abdominal pain, nausea or vomiting, dizziness or headache.  Patient denies dizziness, chest  pain, shortness of breath, abdominal pain, N/V/D/C, new weakness, numbess, tingling.    Objective:   Vitals:   08/17/20 2348 08/18/20 0000 08/18/20 0400 08/18/20 0720  BP: 129/68 (!) 143/68 (!) 149/85 128/62  Pulse: 71 69 72 66  Resp: 15 13 15 15   Temp: 98.7 F (37.1 C)  98.9 F (37.2 C) 98.3 F (36.8 C)  TempSrc: Axillary  Axillary Oral  SpO2: 96% 96% 96% 96%  Height:        Intake/Output Summary (Last 24 hours) at 08/18/2020 1219 Last data filed at 08/18/2020 0600 Gross per 24 hour  Intake 449.72 ml  Output 200 ml  Net 249.72 ml   There were no vitals filed for this visit.  Exam General: Well developed, well nourished, NAD, appears stated age HEENT: NCAT, mucous membranes moist.  Cardiovascular: S1 S2 auscultated, RRR Respiratory: Clear to auscultation bilaterally  Abdomen: Soft, nontender, nondistended, + bowel sounds Extremities: warm dry without cyanosis clubbing or edema. R Great toe with mild edema at MTP joint Neuro: AAOx3, able to answer simple questions and follow commands, appears to have expressive aphasia otherwise nonfocal Psych: Appropriate   Data Reviewed: I have personally reviewed following labs and imaging studies  CBC: Recent Labs  Lab 08/16/20 1451 08/17/20 1807 08/18/20 0010  WBC 9.9 11.7* 11.1*  HGB 13.9 13.8 13.0  HCT 42.6 40.8 39.5  MCV 91.6 89.9 91.9  PLT 224 227 366   Basic Metabolic Panel: Recent Labs  Lab 08/16/20 1451 08/16/20 1556  NA 141  --   K 4.1  --   CL 103  --   CO2 26  --   GLUCOSE 103*  --   BUN 11  --   CREATININE 0.94  --   CALCIUM 10.1  --   MG  --  2.0  PHOS  --  2.7   GFR: Estimated Creatinine Clearance: 51.3 mL/min (by C-G formula based on SCr of 0.94 mg/dL). Liver Function Tests: Recent Labs  Lab 08/16/20 1451  AST 14*  ALT 10  ALKPHOS 56  BILITOT 0.8  PROT 7.1  ALBUMIN 4.5   No results for input(s): LIPASE, AMYLASE in the last 168 hours. Recent Labs  Lab 08/16/20 1556  AMMONIA 20    Coagulation Profile: Recent Labs  Lab 08/16/20 1556 08/17/20 1315 08/18/20 0010  INR 2.0* 1.6* 1.7*   Cardiac Enzymes: No results for input(s): CKTOTAL, CKMB, CKMBINDEX, TROPONINI in the last 168 hours. BNP (last 3 results) No results for input(s): PROBNP in the last 8760 hours. HbA1C: Recent Labs    08/18/20 0010  HGBA1C 5.9*   CBG: No results for input(s): GLUCAP in the last 168 hours. Lipid Profile: Recent Labs    08/18/20 0010  CHOL 263*  HDL 111  LDLCALC 131*  TRIG 106  CHOLHDL 2.4   Thyroid Function Tests: No results for input(s): TSH, T4TOTAL, FREET4, T3FREE, THYROIDAB in the last 72 hours. Anemia Panel: Recent Labs    08/18/20 0825  VITAMINB12 167*  FOLATE 17.4   Urine analysis:    Component Value Date/Time   COLORURINE YELLOW 08/16/2020 1814   APPEARANCEUR CLEAR 08/16/2020 1814   LABSPEC 1.011 08/16/2020 1814  PHURINE 7.0 08/16/2020 1814   GLUCOSEU NEGATIVE 08/16/2020 1814   HGBUR NEGATIVE 08/16/2020 1814   BILIRUBINUR NEGATIVE 08/16/2020 1814   KETONESUR TRACE (A) 08/16/2020 1814   PROTEINUR NEGATIVE 08/16/2020 1814   NITRITE NEGATIVE 08/16/2020 1814   LEUKOCYTESUR TRACE (A) 08/16/2020 1814   Sepsis Labs: @LABRCNTIP (procalcitonin:4,lacticidven:4)  ) Recent Results (from the past 240 hour(s))  Resp Panel by RT-PCR (Flu A&B, Covid) Nasopharyngeal Swab     Status: None   Collection Time: 08/17/20  4:08 AM   Specimen: Nasopharyngeal Swab; Nasopharyngeal(NP) swabs in vial transport medium  Result Value Ref Range Status   SARS Coronavirus 2 by RT PCR NEGATIVE NEGATIVE Final    Comment: (NOTE) SARS-CoV-2 target nucleic acids are NOT DETECTED.  The SARS-CoV-2 RNA is generally detectable in upper respiratory specimens during the acute phase of infection. The lowest concentration of SARS-CoV-2 viral copies this assay can detect is 138 copies/mL. A negative result does not preclude SARS-Cov-2 infection and should not be used as the sole basis  for treatment or other patient management decisions. A negative result may occur with  improper specimen collection/handling, submission of specimen other than nasopharyngeal swab, presence of viral mutation(s) within the areas targeted by this assay, and inadequate number of viral copies(<138 copies/mL). A negative result must be combined with clinical observations, patient history, and epidemiological information. The expected result is Negative.  Fact Sheet for Patients:  EntrepreneurPulse.com.au  Fact Sheet for Healthcare Providers:  IncredibleEmployment.be  This test is no t yet approved or cleared by the [place] FDA and  has been authorized for detection and/or diagnosis of SARS-CoV-2 by FDA under an Emergency Use Authorization (EUA). This EUA will remain  in effect (meaning this test can be used) for the duration of the COVID-19 declaration under Section 564(b)(1) of the Act, 21 U.S.C.section 360bbb-3(b)(1), unless the authorization is terminated  or revoked sooner.       Influenza A by PCR NEGATIVE NEGATIVE Final   Influenza B by PCR NEGATIVE NEGATIVE Final    Comment: (NOTE) The Xpert Xpress SARS-CoV-2/FLU/RSV plus assay is intended as an aid in the diagnosis of influenza from Nasopharyngeal swab specimens and should not be used as a sole basis for treatment. Nasal washings and aspirates are unacceptable for Xpert Xpress SARS-CoV-2/FLU/RSV testing.  Fact Sheet for Patients: EntrepreneurPulse.com.au  Fact Sheet for Healthcare Providers: IncredibleEmployment.be  This test is not yet approved or cleared by the [place] FDA and has been authorized for detection and/or diagnosis of SARS-CoV-2 by FDA under an Emergency Use Authorization (EUA). This EUA will remain in effect (meaning this test can be used) for the duration of the COVID-19 declaration under Section 564(b)(1) of the Act, 21  U.S.C. section 360bbb-3(b)(1), unless the authorization is terminated or revoked.  Performed at [facility], [address], [place], [place] [address]       Radiology Studies: CT Abdomen Pelvis Wo Contrast  Result Date: 08/16/2020 CLINICAL DATA:  Abdominal distension, pain, and constipation. Altered mental status. EXAM: CT ABDOMEN AND PELVIS WITHOUT CONTRAST TECHNIQUE: Multidetector CT imaging of the abdomen and pelvis was performed following the standard protocol without IV contrast. COMPARISON:  CT 11/24/2017 FINDINGS: Lower chest: Mild linear lingular atelectasis or scar. No pleural fluid. Normal heart size. Hepatobiliary: Mild motion artifact through the liver and gallbladder. Allowing for this, no evidence of focal hepatic abnormality. Tiny low-density lesions in the liver on prior contrast-enhanced exam are not seen. No gallbladder inflammation or calcified gallstone. No biliary dilatation, although  the common bile duct is not well-defined on the current exam. Pancreas: No ductal dilatation or inflammation. Spleen: Normal in size without focal abnormality. Adrenals/Urinary Tract: Normal adrenal glands. Slight prominence of the renal collecting systems felt to be due to prominently distended urinary bladder. No renal calculi. Trace chronic bilateral perinephric edema. Distended urinary bladder without wall thickening. No focal bladder abnormality. Stomach/Bowel: Small hiatal hernia. Physiologically distended stomach. No small bowel obstruction or inflammation. Normal appendix. Small volume of colonic stool, no increased stool burden. No colonic wall thickening. Occasional sigmoid diverticula without diverticulitis. Vascular/Lymphatic: Aortic atherosclerosis without aneurysm. No portal venous or mesenteric gas. No abdominopelvic adenopathy. Reproductive: Status post hysterectomy. No adnexal masses. Other: No free air, free fluid, or intra-abdominal fluid collection.  Musculoskeletal: Surgical hardware in the thoracic spine is partially included. There are no acute or suspicious osseous abnormalities. IMPRESSION: 1. Distended urinary bladder. Mild prominence of the renal collecting systems, likely secondary to bladder distention. No bladder wall thickening or urinary tract inflammation. Recommend correlation with urinalysis. 2. Minimal sigmoid colonic diverticulosis without diverticulitis. Aortic Atherosclerosis (ICD10-I70.0). Electronically Signed   By: Keith Rake M.D.   On: 08/16/2020 18:09   DG Chest 2 View  Result Date: 08/16/2020 CLINICAL DATA:  Altered mental status EXAM: CHEST - 2 VIEW COMPARISON:  07/15/2019, 10/02/2018 FINDINGS: The heart size and mediastinal contours are within normal limits. Both lungs are clear. Surgical hardware in the midthoracic region. IMPRESSION: No active cardiopulmonary disease. Electronically Signed   By: Donavan Foil M.D.   On: 08/16/2020 16:26   CT Head Wo Contrast  Result Date: 08/16/2020 CLINICAL DATA:  Mental status change EXAM: CT HEAD WITHOUT CONTRAST TECHNIQUE: Contiguous axial images were obtained from the base of the skull through the vertex without intravenous contrast. COMPARISON:  MRI 07/15/2019, CT brain 07/15/2019 FINDINGS: Brain: No acute territorial infarction, hemorrhage or intracranial mass. Mild atrophy. Stable ventricle size and configuration. Vascular: No hyperdense vessels.  No unexpected calcification Skull: Normal. Negative for fracture or focal lesion. Sinuses/Orbits: No acute finding. Other: None IMPRESSION: No CT evidence for acute intracranial abnormality.  Mild atrophy Electronically Signed   By: Donavan Foil M.D.   On: 08/16/2020 16:19   MR ANGIO HEAD WO CONTRAST  Result Date: 08/17/2020 CLINICAL DATA:  Transient ischemic attack. EXAM: MRI HEAD WITHOUT CONTRAST MRA HEAD WITHOUT CONTRAST TECHNIQUE: Multiplanar, multi-echo pulse sequences of the brain and surrounding structures were acquired  without intravenous contrast. Angiographic images of the Circle of Willis were acquired using MRA technique without intravenous contrast. COMPARISON: No pertinent prior exam. COMPARISON:  Same day CT head.  MRI Jul 15, 2019. FINDINGS: MRI HEAD FINDINGS Mildly motion limited study.  Within this limitation: Brain: No acute infarction, hemorrhage, hydrocephalus, extra-axial collection or mass lesion. Similar mild ventriculomegaly without rounding of the temporal horns. Vascular: See below. Skull and upper cervical spine: Normal marrow signal. Sinuses/Orbits: Clear sinuses.  Unremarkable orbits. Other: No mastoid effusions. MRA HEAD FINDINGS Anterior circulation: Bilateral intracranial ICAs, MCAs, and ACAs are patent without evidence of proximal hemodynamically significant stenosis. No aneurysm identified. Posterior circulation: The visualized intradural vertebral arteries, basilar artery, and posterior cerebral arteries are patent without evidence of hemodynamically significant proximal stenosis. Left fetal type PCA, anatomic variant. No aneurysm identified. Anatomic variants: See above. IMPRESSION: MRI: 1. No evidence of acute intracranial abnormality. Specifically, no acute infarct. 2. Similar appearance of the brain in comparison to 2021 prior MRI, including mild ventriculomegaly. MRA: No large vessel occlusion or proximal hemodynamically significant stenosis. Electronically Signed  By: Margaretha Sheffield MD   On: 08/17/2020 16:33   MR BRAIN WO CONTRAST  Result Date: 08/17/2020 CLINICAL DATA:  Transient ischemic attack. EXAM: MRI HEAD WITHOUT CONTRAST MRA HEAD WITHOUT CONTRAST TECHNIQUE: Multiplanar, multi-echo pulse sequences of the brain and surrounding structures were acquired without intravenous contrast. Angiographic images of the Circle of Willis were acquired using MRA technique without intravenous contrast. COMPARISON: No pertinent prior exam. COMPARISON:  Same day CT head.  MRI Jul 15, 2019. FINDINGS:  MRI HEAD FINDINGS Mildly motion limited study.  Within this limitation: Brain: No acute infarction, hemorrhage, hydrocephalus, extra-axial collection or mass lesion. Similar mild ventriculomegaly without rounding of the temporal horns. Vascular: See below. Skull and upper cervical spine: Normal marrow signal. Sinuses/Orbits: Clear sinuses.  Unremarkable orbits. Other: No mastoid effusions. MRA HEAD FINDINGS Anterior circulation: Bilateral intracranial ICAs, MCAs, and ACAs are patent without evidence of proximal hemodynamically significant stenosis. No aneurysm identified. Posterior circulation: The visualized intradural vertebral arteries, basilar artery, and posterior cerebral arteries are patent without evidence of hemodynamically significant proximal stenosis. Left fetal type PCA, anatomic variant. No aneurysm identified. Anatomic variants: See above. IMPRESSION: MRI: 1. No evidence of acute intracranial abnormality. Specifically, no acute infarct. 2. Similar appearance of the brain in comparison to 2021 prior MRI, including mild ventriculomegaly. MRA: No large vessel occlusion or proximal hemodynamically significant stenosis. Electronically Signed   By: Margaretha Sheffield MD   On: 08/17/2020 16:33   EEG adult  Result Date: 08/17/2020 Lora Havens, MD     08/18/2020  9:56 AM Patient Name: Meredith [name] MRN: [id number] Epilepsy Attending: Lora Havens Referring Physician/Provider: Dr Karmen Bongo Date: 08/17/2020 Duration: 27.57 mins Patient history: 70 y.o. female who presented to the ED with impaired communication. EEG to evaluate for seizure Level of alertness: Awake AEDs during EEG study: LEV Technical aspects: This EEG study was done with scalp electrodes positioned according to the 10-20 International system of electrode placement. Electrical activity was acquired at a sampling rate of 500Hz  and reviewed with a high frequency filter of 70Hz  and a low frequency filter of 1Hz . EEG data were  recorded continuously and digitally stored. Description: The posterior dominant rhythm consists of 6 Hz activity of moderate voltage (25-35 uV) seen predominantly in posterior head regions, symmetric and reactive to eye opening and eye closing. EEG showed continuous generalized 5 to 6 Hz theta as well as intermittent generalized 2-3hz  delta slowing, at times with triphasic morphology. Hyperventilation and photic stimulation were not performed.   ABNORMALITY - Continuous slow, generalized - Background slow IMPRESSION: This study is suggestive of moderate diffuse encephalopathy, nonspecific etiology. No seizures or definite epileptiform discharges were seen throughout the recording. Priyanka Barbra Sarks   Overnight EEG with video  Result Date: 08/18/2020 Lora Havens, MD     08/18/2020 10:08 AM Patient Name: Meredith [name] MRN: [id number] Epilepsy Attending: Lora Havens Referring Physician/Provider: Dr Donnetta Simpers Duration: 08/17/2020 1851 to 08/18/2020 0902  Patient history: 70 y.o. female who presented to the ED with impaired communication. EEG to evaluate for seizure  Level of alertness: Awake  AEDs during EEG study: LEV  Technical aspects: This EEG study was done with scalp electrodes positioned according to the 10-20 International system of electrode placement. Electrical activity was acquired at a sampling rate of 500Hz  and reviewed with a high frequency filter of 70Hz  and a low frequency filter of 1Hz . EEG data were recorded continuously and digitally stored.  Description: The posterior dominant rhythm consists  of 6 Hz activity of moderate voltage (25-35 uV) seen predominantly in posterior head regions, symmetric and reactive to eye opening and eye closing. EEG showed continuous generalized 5 to 6 Hz theta as well as intermittent generalized 2-3hz  delta slowing, at times with triphasic morphology. Hyperventilation and photic stimulation were not performed.    ABNORMALITY - Continuous slow,  generalized - Background slow  IMPRESSION: This study is suggestive of moderate diffuse encephalopathy, nonspecific etiology. No seizures or definite epileptiform discharges were seen throughout the recording.  Priyanka Barbra Sarks     Scheduled Meds:  aspirin  300 mg Rectal Daily   Or   aspirin  325 mg Oral Daily   doxycycline  100 mg Oral Q12H   levothyroxine  112 mcg Oral Q0600   melatonin  20 mg Oral QHS   metoprolol succinate  50 mg Oral Daily   pantoprazole (PROTONIX) IV  40 mg Intravenous Q12H   pravastatin  20 mg Oral QHS   QUEtiapine  200 mg Oral QHS   senna-docusate  2 tablet Oral BID   [START ON 08/26/2020] thiamine injection  100 mg Intravenous Daily   verapamil  120 mg Oral QHS   warfarin  5 mg Oral ONCE-1600   Warfarin - Pharmacist Dosing Inpatient   Does not apply q1600   Continuous Infusions:  sodium chloride 50 mL/hr at 08/17/20 1836   thiamine injection 500 mg (08/18/20 1004)   Followed by   Derrill Memo ON 08/20/2020] thiamine injection       LOS: 0 days   Time Spent in minutes   45 minutes  Shahiem Bedwell D.O. on 08/18/2020 at 12:19 PM  Between 7am to 7pm - Please see pager noted on [email or web address]  After 7pm go to [email or web address]  And look for the night coverage person covering for me after hours  [facility] Office  [phone]

## 2020-08-18 NOTE — Progress Notes (Signed)
vLTM EEG complete. No skin breakdown seen

## 2020-08-18 NOTE — Progress Notes (Signed)
ANTICOAGULATION CONSULT NOTE - Initial Consult  Pharmacy Consult for warfarin  Indication: atrial fibrillation   Patient Measurements: Height: 4\' 11"  (149.9 cm) IBW/kg (Calculated) : 43.2   Vital Signs: Temp: 98.3 F (36.8 C) (06/23 0720) Temp Source: Oral (06/23 0720) BP: 128/62 (06/23 0720) Pulse Rate: 66 (06/23 0720)   Labs: Recent Labs    08/16/20 1451 08/16/20 1556 08/17/20 1315 08/17/20 1807 08/18/20 0010  HGB 13.9  --   --  13.8 13.0  HCT 42.6  --   --  40.8 39.5  PLT 224  --   --  227 220  LABPROT  --  23.0* 19.0*  --  19.8*  INR  --  2.0* 1.6*  --  1.7*  CREATININE 0.94  --   --   --   --     Estimated Creatinine Clearance: 51.3 mL/min (by C-G formula based on SCr of 0.94 mg/dL).   Medical History: Past Medical History:  Diagnosis Date   Anxiety    Atrial fibrillation (HCC)    Bipolar 2 disorder ([place])    Depression    Hematoma 07/06/2020   History of cardioversion    x2   Hyperlipidemia    Hypothyroidism    Migraine headache    Osteoporosis    Septic arthritis of interphalangeal joint of toe of right foot ([place]) 07/06/2020    Medications:  Scheduled:   aspirin  300 mg Rectal Daily   Or   aspirin  325 mg Oral Daily   doxycycline  100 mg Oral Q12H   enoxaparin (LOVENOX) injection  40 mg Subcutaneous Q24H   levothyroxine  112 mcg Oral Q0600   melatonin  20 mg Oral QHS   metoprolol succinate  50 mg Oral Daily   pantoprazole (PROTONIX) IV  40 mg Intravenous Q12H   pravastatin  20 mg Oral QHS   QUEtiapine  200 mg Oral QHS   senna-docusate  2 tablet Oral BID   [START ON 08/26/2020] thiamine injection  100 mg Intravenous Daily   verapamil  120 mg Oral QHS   Warfarin - Pharmacist Dosing Inpatient   Does not apply q1600    Assessment: 70 year old female presented to [facility] 08/16/20 with AMS. Patient takes warfarin PTA for history of atrial fibrillation. Prior to admission medication history difficult to obtain from patient and family and last dose of  warfarin unknown. However, patient's last warfarin clinic appointment on 08/04/20 shows regimen of warfarin 3.75 mg daily.   Patient's INR was therapeutic at 2 upon admission and given one dose of warfarin 5 mg 08/17/20 for a subtherapeutic INR of 1.6.  Spoke to RN, reports one episode of vomiting yesterday evening at ~1700 with possible blood but unable to test. Hgb stable WNL at 13.0. INR remains subtherapeutic today at 1.7 after warfarin 5 mg last night ~1900. Patient also received one dose of enoxaparin 40 mg last night ~2030, after warfarin. There has been no further episodes of vomiting and no other bleeding reported.   Goal of Therapy:  INR 2-3 Monitor platelets by anticoagulation protocol: Yes   Plan:  - Discontinue enoxaparin 40 mg - Give warfarin 5 mg PO once today at 1600 - Daily INR, CBC and monitor for s/sx's of bleeding     Alonza Knisley L. Devin Going PharmD, [place] PGY2 Pharmacy Resident [phone] 08/18/20      9:23 AM  Please check AMION for all [place] phone numbers After 10:00 PM, call the [place] [phone]

## 2020-08-18 NOTE — Progress Notes (Signed)
Initial Nutrition Assessment  DOCUMENTATION CODES:   Not applicable  INTERVENTION:   -Ensure Enlive po TID, each supplement provides 350 kcal and 20 grams of protein  -Magic cup TID with meals, each supplement provides 290 kcal and 9 grams of protein  -MVI with minerals daily  NUTRITION DIAGNOSIS:   Inadequate oral intake related to poor appetite as evidenced by per patient/family report.  GOAL:   Patient will meet greater than or equal to 90% of their needs  MONITOR:   PO intake, Supplement acceptance, Diet advancement, Labs, Weight trends, Skin, I & O's  REASON FOR ASSESSMENT:   Consult Assessment of nutrition requirement/status  ASSESSMENT:   Meredith [name] is a 70 y.o. female with medical history significant of afib on Coumadin; bipolar; HLD; hypothyroidism; and septic arthritis of the R toe presenting with AMS.  Her brother reports that can't put her thoughts together and communicate and her R leg is shaking.  Her brother was away over the weekend and texted and called when he came back without reply.  Her pastor recommended getting her evaluated.  She is alert and able to talk but can't really put thoughts together.  She was not having difficulty caring for herself.  He got a text from her Saturday for Father's Day but he hadn't actually spoken to her in almost 2 weeks.  No one had spoken to her in the interim.  She also has abdominal pain, fairly new, just TTP.  He is uncertain if her bipolar is usually controlled.  Pt admitted with AMS.   Reviewed I/O's: +250 ml x 24 hours  UOP: 200 ml x 24 hours  Spoke with pt and brother at bedside. Per pt brother, pt generally has a good appetite (Breakfast: cereal or eggs and toast; Lunch: sandwich; Dinner: meat, starch, and vegetable). Pt has not been eating much since being admitted. Per pt, she tried to drink water and eat applesauce, but she threw it up.   Pt denies any weight loss. Brother reports UBW of 160-170#. Pt  typically gets around with a cane and is bale to drive herself.   Discussed importance of good meal and supplement intake to promote healing. She is amenable to supplements.   Medications reviewed and include melatonin, senokot, and thiamine.   Labs reviewed.   NUTRITION - FOCUSED PHYSICAL EXAM:  Flowsheet Row Most Recent Value  Orbital Region No depletion  Upper Arm Region No depletion  Thoracic and Lumbar Region No depletion  Buccal Region No depletion  Temple Region No depletion  Clavicle Bone Region No depletion  Clavicle and Acromion Bone Region No depletion  Scapular Bone Region No depletion  Dorsal Hand No depletion  Patellar Region No depletion  Anterior Thigh Region No depletion  Posterior Calf Region No depletion  Edema (RD Assessment) None  Hair Reviewed  Eyes Reviewed  Mouth Reviewed  Skin Reviewed  Nails Reviewed       Diet Order:   Diet Order             Diet Heart Room service appropriate? Yes; Fluid consistency: Thin  Diet effective ____                   EDUCATION NEEDS:   Education needs have been addressed  Skin:  Skin Assessment: Reviewed RN Assessment  Last BM:  08/18/20  Height:   Ht Readings from Last 1 Encounters:  08/16/20 4\' 11"  (1.499 m)    Weight:   Wt Readings from  Last 1 Encounters:  08/05/20 81.2 kg    Ideal Body Weight:  44.7 kg  BMI:  Body mass index is 36.17 kg/m.  Estimated Nutritional Needs:   Kcal:  1550-1750  Protein:  70-85 grams  Fluid:  > 1.5 L    Loistine Chance, RD, LDN, [place] Registered Dietitian II Certified Diabetes Care and Education Specialist Please refer to [facility] for RD and/or RD on-call/weekend/after hours pager

## 2020-08-18 NOTE — TOC Initial Note (Signed)
Transition of Care [facility]) - Initial/Assessment Note    Patient Details  Name: Meredith [name] MRN: [id number] Date of Birth: 1950/08/13  [facility]) CM/SW Contact:    Benard Halsted, LCSW Phone Number: 08/18/2020, 3:04 PM  Clinical Narrative:                 CSW received consult for possible SNF placement at time of discharge. CSW spoke with patient's brother. He reported that patient lives alone and he is currently unable to care for patient at given patient's current physical needs and fall risk. He expressed understanding of PT recommendation and is agreeable to SNF placement at time of discharge. Patient's brother reports preference for [facility] as patient went there a few years ago. CSW discussed insurance authorization process and provided Medicare SNF ratings list. Patient has received the COVID vaccines and boosters. Pasrr pending. No further questions reported at this time.    Expected Discharge Plan: Skilled Nursing Facility Barriers to Discharge: Continued Medical Work up, SNF Pending bed offer   Patient Goals and CMS Choice Patient states their goals for this hospitalization and ongoing recovery are:: Rehab CMS Medicare.gov Compare Post Acute Care list provided to:: Patient Represenative (must comment) Choice offered to / list presented to : Sibling  Expected Discharge Plan and Services Expected Discharge Plan: [place] In-house Referral: Clinical Social Work   Post Acute Care Choice: [place] Living arrangements for the past 2 months: Apartment                                      Prior Living Arrangements/Services Living arrangements for the past 2 months: Apartment Lives with:: Self Patient language and need for interpreter reviewed:: Yes Do you feel safe going back to the place where you live?: Yes      Need for Family Participation in Patient Care: Yes (Comment) Care giver support system in place?: Yes  (comment)   Criminal Activity/Legal Involvement Pertinent to Current Situation/Hospitalization: No - Comment as needed  Activities of Daily Living      Permission Sought/Granted Permission sought to share information with : Facility Sport and exercise psychologist, Family Supports Permission granted to share information with : No  Share Information with NAME: [name]  Permission granted to share info w AGENCY: SNFs  Permission granted to share info w Relationship: brother  Permission granted to share info w Contact Information: [phone]  Emotional Assessment Appearance:: Appears stated age Attitude/Demeanor/Rapport: Unable to Assess Affect (typically observed): Unable to Assess Orientation: : Oriented to Self Alcohol / Substance Use: Not Applicable Psych Involvement: Yes (comment) (Neuro status consult)  Admission diagnosis:  Acute encephalopathy [G93.40] Recurrent falls [R29.6] Altered mental status, unspecified altered mental status type [R41.82] AMS (altered mental status) [R41.82] Patient Active Problem List   Diagnosis Date Noted   AMS (altered mental status) 08/18/2020   Emesis 08/17/2020   Septic arthritis of interphalangeal joint of toe of right foot ([place]) 07/06/2020   Hematoma 07/06/2020   Acquired hallux varus 04/08/2020   Acquired thrombophilia ([place]) 04/08/2020   Allergic rhinitis due to pollen 04/08/2020   Anemia 04/08/2020   Aphasia 04/08/2020   Bipolar 2 disorder ([place]) 04/08/2020   Carpal tunnel syndrome of right wrist 04/08/2020   Constipation 04/08/2020   Irritable bowel syndrome 04/08/2020   Slow transit constipation 04/08/2020   Gastroesophageal reflux disease without esophagitis 04/08/2020   Long term (current) use  of anticoagulants 04/08/2020   Menopausal and postmenopausal disorder 04/08/2020   Morbid obesity ([place]) 04/08/2020   Mixed hyperlipidemia 04/08/2020   Migraine without aura, not refractory 04/08/2020   Class 2 obesity due to excess calories with  body mass index (BMI) of 36.0 to 36.9 in adult 04/08/2020   Osteopenia 04/08/2020   Other specified postprocedural states 04/08/2020   Primary insomnia 04/08/2020   Primary osteoarthritis, unspecified ankle and foot 04/08/2020   Pure hypercholesterolemia 04/08/2020   Recurrent falls 04/08/2020   Unsteady gait 04/08/2020   Urinary tract infectious disease 04/08/2020   Vitamin D deficiency 04/08/2020   Allergic rhinitis 03/11/2020   Vasomotor rhinitis 03/11/2020   C. difficile diarrhea 07/15/2019   Chronic kidney disease, stage III (moderate) ([place]) 07/15/2019   Leukocytosis 07/15/2019   Diabetes mellitus type 2, controlled ([place]) 07/15/2019   Lithium toxicity 10/27/2018   Acute encephalopathy    Acute ischemic stroke ([place]) 10/22/2018   Renal insufficiency 10/22/2018   Pain and swelling of right lower leg 10/22/2018   Hypothyroidism 02/24/2018   Encounter for therapeutic drug monitoring 01/03/2018   PTSD (post-traumatic stress disorder) 11/29/2017   Falls 11/25/2017   Disorientation    Delirium 11/24/2017   Stiffness of finger joint of right hand 06/18/2017   Osteoarthritis of carpometacarpal (CMC) joint of thumb 05/21/2017   Anxiety 11/29/2016   Chronic pain syndrome 07/30/2016   Bipolar disorder, curr episode mixed, severe, with psychotic features ([place]) 07/28/2016   PAF (paroxysmal atrial fibrillation) ([place]) 11/14/2015   Intractable chronic migraine without aura 04/24/2012   PCP:  Kristen Loader, FNP Pharmacy:   [facility] [address] [place] - [address] [place] [place] [address] Phone: [phone] Fax: [phone]     Social Determinants of Health (SDOH) Interventions    Readmission Risk Interventions Readmission Risk Prevention Plan 08/18/2020  Transportation Screening Complete  PCP or Specialist Appt within 3-5 Days Complete  HRI or [place] Complete  Social Work Consult for [place] Planning/Counseling  Complete  Palliative Care Screening Not Applicable  Medication Review Press photographer) Complete  Some recent data might be hidden

## 2020-08-18 NOTE — Progress Notes (Signed)
OT Cancellation Note  Patient Details Name: Meredith [name] MRN: [id number] DOB: [birth year]   Cancelled Treatment:    Reason Eval/Treat Not Completed: Patient at procedure or test/ unavailable (ECHO. Will return as schedule allows.)  [place], OTR/L Acute Rehab Pager: [phone] Office: [phone] 08/18/2020, 10:11 AM

## 2020-08-18 NOTE — Progress Notes (Signed)
  Echocardiogram 2D Echocardiogram has been performed.  Meredith [name] 08/18/2020, 10:37 AM

## 2020-08-18 NOTE — NC FL2 (Addendum)
[place] LEVEL OF CARE SCREENING TOOL     IDENTIFICATION  Patient Name: Meredith [name] Birthdate: 04-13-1950 Sex: female Admission Date (Current Location): 08/16/2020  [facility] and [place] Number:  Herbalist and Address:  [facility] [place] [place] [address] [place], [place] [address]      Provider Number: [id number]  Attending Physician Name and Address:  Cristal Ford, DO  Relative Name and Phone Number:  [name], brother, [phone]    Current Level of Care: Hospital Recommended Level of Care: [place] Prior Approval Number:    Date Approved/Denied:   PASRR Number: [id number] F expires 10/21/20  Discharge Plan: SNF    Current Diagnoses: Patient Active Problem List   Diagnosis Date Noted   AMS (altered mental status) 08/18/2020   Emesis 08/17/2020   Septic arthritis of interphalangeal joint of toe of right foot ([place]) 07/06/2020   Hematoma 07/06/2020   Acquired hallux varus 04/08/2020   Acquired thrombophilia ([place]) 04/08/2020   Allergic rhinitis due to pollen 04/08/2020   Anemia 04/08/2020   Aphasia 04/08/2020   Bipolar 2 disorder ([place]) 04/08/2020   Carpal tunnel syndrome of right wrist 04/08/2020   Constipation 04/08/2020   Irritable bowel syndrome 04/08/2020   Slow transit constipation 04/08/2020   Gastroesophageal reflux disease without esophagitis 04/08/2020   Long term (current) use of anticoagulants 04/08/2020   Menopausal and postmenopausal disorder 04/08/2020   Morbid obesity ([place]) 04/08/2020   Mixed hyperlipidemia 04/08/2020   Migraine without aura, not refractory 04/08/2020   Class 2 obesity due to excess calories with body mass index (BMI) of 36.0 to 36.9 in adult 04/08/2020   Osteopenia 04/08/2020   Other specified postprocedural states 04/08/2020   Primary insomnia 04/08/2020   Primary osteoarthritis, unspecified ankle and foot 04/08/2020   Pure hypercholesterolemia 04/08/2020    Recurrent falls 04/08/2020   Unsteady gait 04/08/2020   Urinary tract infectious disease 04/08/2020   Vitamin D deficiency 04/08/2020   Allergic rhinitis 03/11/2020   Vasomotor rhinitis 03/11/2020   C. difficile diarrhea 07/15/2019   Chronic kidney disease, stage III (moderate) ([place]) 07/15/2019   Leukocytosis 07/15/2019   Diabetes mellitus type 2, controlled ([place]) 07/15/2019   Lithium toxicity 10/27/2018   Acute encephalopathy    Acute ischemic stroke ([place]) 10/22/2018   Renal insufficiency 10/22/2018   Pain and swelling of right lower leg 10/22/2018   Hypothyroidism 02/24/2018   Encounter for therapeutic drug monitoring 01/03/2018   PTSD (post-traumatic stress disorder) 11/29/2017   Falls 11/25/2017   Disorientation    Delirium 11/24/2017   Stiffness of finger joint of right hand 06/18/2017   Osteoarthritis of carpometacarpal [facility]) joint of thumb 05/21/2017   Anxiety 11/29/2016   Chronic pain syndrome 07/30/2016   Bipolar disorder, curr episode mixed, severe, with psychotic features ([place]) 07/28/2016   PAF (paroxysmal atrial fibrillation) ([place]) 11/14/2015   Intractable chronic migraine without aura 04/24/2012    Orientation RESPIRATION BLADDER Height & Weight     Self, Place, Situation, Time  Normal Incontinent, External catheter Weight:   Height:  4\' 11"  (149.9 cm)  BEHAVIORAL SYMPTOMS/MOOD NEUROLOGICAL BOWEL NUTRITION STATUS      Incontinent Diet (Please see DC summary)  AMBULATORY STATUS COMMUNICATION OF NEEDS Skin   Extensive Assist Verbally Normal                       Personal Care Assistance Level of Assistance  Bathing, Feeding, Dressing Bathing Assistance: Maximum assistance Feeding assistance: Independent  Dressing Assistance: Limited assistance     Functional Limitations Info             SPECIAL CARE FACTORS FREQUENCY  PT (By licensed PT), OT (By licensed OT)     PT Frequency: 5x/week OT Frequency: 5x/week            Contractures  Contractures Info: Not present    Additional Factors Info  Code Status, Allergies, Psychotropic Code Status Info: DNR Allergies Info: Codeine, Adhesive (Tape), Celebrex (Celecoxib), Erythromycin, Other, Penicillamine, Sulfa Antibiotics, Tricyclic Antidepressants, Amoxicillin, Ampicillin, Cephalexin, Macrodantin (Nitrofurantoin), Penicillins Psychotropic Info: Seroquel         Current Medications (08/18/2020):  This is the current hospital active medication list Current Facility-Administered Medications  Medication Dose Route Frequency Provider Last Rate Last Admin   0.9 %  sodium chloride infusion   Intravenous Continuous Karmen Bongo, MD 50 mL/hr at 08/17/20 1836 New Bag at 08/17/20 1836   acetaminophen (TYLENOL) tablet 650 mg  650 mg Oral Q4H PRN Karmen Bongo, MD       Or   acetaminophen (TYLENOL) 160 MG/5ML solution 650 mg  650 mg Per Tube Q4H PRN Karmen Bongo, MD       Or   acetaminophen (TYLENOL) suppository 650 mg  650 mg Rectal Q4H PRN Karmen Bongo, MD       aspirin suppository 300 mg  300 mg Rectal Daily Karmen Bongo, MD       Or   aspirin tablet 325 mg  325 mg Oral Daily Karmen Bongo, MD   325 mg at 08/17/20 1854   cyanocobalamin ((VITAMIN B-12)) injection 1,000 mcg  1,000 mcg Intramuscular Once Donnetta Simpers, MD       doxycycline (VIBRA-TABS) tablet 100 mg  100 mg Oral Lillia [place], MD   100 mg at 08/17/20 2034   levothyroxine (SYNTHROID) tablet 112 mcg  112 mcg Oral Q0600 Karmen Bongo, MD       melatonin tablet 20 mg  20 mg Oral Ivery Quale, MD   20 mg at 08/17/20 2033   metoprolol succinate (TOPROL-XL) 24 hr tablet 50 mg  50 mg Oral Daily Karmen Bongo, MD   50 mg at 08/17/20 1854   ondansetron [facility]) injection 4 mg  4 mg Intravenous Q6H PRN Karmen Bongo, MD   4 mg at 08/18/20 0953   pantoprazole (PROTONIX) injection 40 mg  40 mg Intravenous Lillia Mountain, MD   40 mg at 08/18/20 0954   pravastatin (PRAVACHOL) tablet 20  mg  20 mg Oral Ivery Quale, MD   20 mg at 08/17/20 2034   QUEtiapine (SEROQUEL) tablet 200 mg  200 mg Oral Ivery Quale, MD   200 mg at 08/17/20 2035   senna-docusate (Senokot-S) tablet 1 tablet  1 tablet Oral QHS PRN Karmen Bongo, MD       senna-docusate (Senokot-S) tablet 2 tablet  2 tablet Oral BID Karmen Bongo, MD   2 tablet at 08/17/20 2035   thiamine 500mg  in normal saline (68ml) IVPB  500 mg Intravenous Q8H Donnetta Simpers, MD 100 mL/hr at 08/18/20 1004 500 mg at 08/18/20 1004   Followed by   Derrill Memo ON 08/20/2020] thiamine (B-1) 250 mg in sodium chloride 0.9 % 50 mL IVPB  250 mg Intravenous Daily Donnetta Simpers, MD       Followed by   Derrill Memo ON 08/26/2020] thiamine (B-1) injection 100 mg  100 mg Intravenous Daily Donnetta Simpers, MD       verapamil (CALAN-SR) CR  tablet 120 mg  120 mg Oral QHS Karmen Bongo, MD   120 mg at 08/17/20 2033   [START ON 08/19/2020] vitamin B-12 (CYANOCOBALAMIN) tablet 1,000 mcg  1,000 mcg Oral Daily Donnetta Simpers, MD       warfarin (COUMADIN) tablet 5 mg  5 mg Oral ONCE-1600 Pham, Mimi L, [place]       Warfarin - Pharmacist Dosing Inpatient   Does not apply [id number] Karmen Bongo, MD   Given at 08/17/20 1857     Discharge Medications: Please see discharge summary for a list of discharge medications.  Relevant Imaging Results:  Relevant Lab Results:   Additional Information SSN: [id number]. [place] COVID-19 Vaccine 03/08/2020 , 07/07/2019 , 04/08/2019 , 03/19/2019  Benard Halsted, LCSW

## 2020-08-18 NOTE — Progress Notes (Signed)
RE: Meredith [name] Date of Birth: 11/30/1950 Date: 08/18/20  Please be advised that the above-named patient will require a short-term nursing home stay - anticipated 30 days or less for rehabilitation and strengthening.  The plan is for return home.

## 2020-08-19 LAB — PROTIME-INR
INR: 2.1 — ABNORMAL HIGH (ref 0.8–1.2)
Prothrombin Time: 23.5 seconds — ABNORMAL HIGH (ref 11.4–15.2)

## 2020-08-19 LAB — CBC
HCT: 35.8 % — ABNORMAL LOW (ref 36.0–46.0)
Hemoglobin: 11.6 g/dL — ABNORMAL LOW (ref 12.0–15.0)
MCH: 30 pg (ref 26.0–34.0)
MCHC: 32.4 g/dL (ref 30.0–36.0)
MCV: 92.5 fL (ref 80.0–100.0)
Platelets: 182 10*3/uL (ref 150–400)
RBC: 3.87 MIL/uL (ref 3.87–5.11)
RDW: 13.3 % (ref 11.5–15.5)
WBC: 8.8 10*3/uL (ref 4.0–10.5)
nRBC: 0 % (ref 0.0–0.2)

## 2020-08-19 LAB — HOMOCYSTEINE: Homocysteine: 10.3 umol/L (ref 0.0–17.2)

## 2020-08-19 MED ORDER — WARFARIN SODIUM 3 MG PO TABS
3.0000 mg | ORAL_TABLET | Freq: Once | ORAL | Status: AC
  Administered 2020-08-19: 3 mg via ORAL
  Filled 2020-08-19: qty 1

## 2020-08-19 MED ORDER — HYDROCODONE-ACETAMINOPHEN 7.5-325 MG PO TABS
1.0000 | ORAL_TABLET | Freq: Once | ORAL | Status: AC
  Administered 2020-08-19: 1 via ORAL
  Filled 2020-08-19: qty 1

## 2020-08-19 NOTE — TOC Progression Note (Addendum)
Transition of Care [facility]) - Progression Note    Patient Details  Name: [name] MRN: [id number] Date of Birth: Oct 27, 1950  [facility] of [facility] CM/SW [place], LCSW Phone Number: 08/19/2020, 11:46 AM  Clinical Narrative:    11:46am-Pasrr under level II in person review. Screener, B, [place], requesting psych note prior to evaluation.   [facility] able to accept patient once pasrr is completed pending bed availability.   3:40pm-CSW faxed psych note to [place]. BMardene Sayer is hopeful to have it completed by Monday.    Expected Discharge Plan: Skilled Nursing Facility Barriers to Discharge: Continued Medical Work up, SNF Pending bed offer  Expected Discharge Plan and Services Expected Discharge Plan: [place] In-house Referral: Clinical Social Work   Post Acute Care Choice: [place] Living arrangements for the past 2 months: Apartment                                       Social Determinants of Health (SDOH) Interventions    Readmission Risk Interventions Readmission Risk Prevention Plan 08/18/2020  Transportation Screening Complete  PCP or Specialist Appt within 3-5 Days Complete  HRI or [place] Complete  Social Work Consult for [place] Planning/Counseling Complete  Palliative Care Screening Not Applicable  Medication Review Press photographer) Complete  Some recent data might be hidden

## 2020-08-19 NOTE — Progress Notes (Signed)
ANTICOAGULATION CONSULT NOTE - Consult  Pharmacy Consult for warfarin  Indication: atrial fibrillation   Patient Measurements: Height: 4\' 11"  (149.9 cm) IBW/kg (Calculated) : 43.2   Vital Signs: Temp: 97.7 F (36.5 C) (06/24 0848) Temp Source: Oral (06/24 0848) BP: 118/41 (06/24 0848) Pulse Rate: 68 (06/24 0848)   Labs: Recent Labs    08/16/20 1451 08/16/20 1556 08/17/20 1315 08/17/20 1807 08/18/20 0010 08/19/20 0102  HGB 13.9  --   --  13.8 13.0 11.6*  HCT 42.6  --   --  40.8 39.5 35.8*  PLT 224  --   --  227 220 182  LABPROT  --    < > 19.0*  --  19.8* 23.5*  INR  --    < > 1.6*  --  1.7* 2.1*  CREATININE 0.94  --   --   --  1.10*  --    < > = values in this interval not displayed.    Estimated Creatinine Clearance: 43.9 mL/min (A) (by C-G formula based on SCr of 1.1 mg/dL (H)).   Medical History: Past Medical History:  Diagnosis Date   Anxiety    Atrial fibrillation (HCC)    Bipolar 2 disorder ([place])    Depression    Hematoma 07/06/2020   History of cardioversion    x2   Hyperlipidemia    Hypothyroidism    Migraine headache    Osteoporosis    Septic arthritis of interphalangeal joint of toe of right foot ([place]) 07/06/2020    Medications:  Scheduled:   aspirin  300 mg Rectal Daily   Or   aspirin  325 mg Oral Daily   doxycycline  100 mg Oral Q12H   feeding supplement  237 mL Oral TID BM   levothyroxine  112 mcg Oral Q0600   melatonin  20 mg Oral QHS   metoprolol succinate  50 mg Oral Daily   multivitamin with minerals  1 tablet Oral Daily   pantoprazole (PROTONIX) IV  40 mg Intravenous Q12H   pravastatin  20 mg Oral QHS   QUEtiapine  200 mg Oral QHS   senna-docusate  2 tablet Oral BID   [START ON 08/26/2020] thiamine injection  100 mg Intravenous Daily   verapamil  120 mg Oral QHS   vitamin B-12  1,000 mcg Oral Daily   Warfarin - Pharmacist Dosing Inpatient   Does not apply q1600    Assessment: 70 year old female [facility] with  AMS. Patient takes warfarin PTA for history of atrial fibrillation. PTA med history difficult to obtain from patient and family. Last dose of warfarin unknown. However, patient's last warfarin clinic appointment on 08/04/20 shows regimen of warfarin 3.75 mg daily.   Patient's INR was therapeutic on admission, but trended down. INR today is therapeutic again at 2.1. Pt is currently on Doxycycline and full dose aspirin 325 mg daily. PO intake is low, but may be baseline. No bleeding per RN currently.   Goal of Therapy:  INR 2-3 Monitor platelets by anticoagulation protocol: Yes   Plan:  Give warfarin 3 mg po x 1 Monitor daily INR, CBC, clinical course, s/sx of bleed, PO intake/diet, Drug-Drug Interactions F/u d/c aspirin?   Thank you for allowing [place] to participate in this patients care. Jens Som, PharmD 08/19/2020 9:18 AM  Please check [email or web address] for unit-specific pharmacy phone numbers.

## 2020-08-19 NOTE — Progress Notes (Signed)
PROGRESS NOTE    Meredith [name]  OVF:[id number] DOB: [birth year] DOA: 08/16/2020 PCP: Kristen Loader, FNP   Brief Narrative:  HPI on 08/17/2020 by Dr. Lowell Guitar Meredith [name] is a 70 y.o. female with medical history significant of afib on Coumadin; bipolar; HLD; hypothyroidism; and septic arthritis of the R toe presenting with AMS.  Her brother reports that can't put her thoughts together and communicate and her R leg is shaking.  Her brother was away over the weekend and texted and called when he came back without reply.  Her pastor recommended getting her evaluated.  She is alert and able to talk but can't really put thoughts together.  She was not having difficulty caring for herself.  He got a text from her Saturday for Father's Day but he hadn't actually spoken to her in almost 2 weeks.  No one had spoken to her in the interim.  She also has abdominal pain, fairly new, just TTP.  He is uncertain if her bipolar is usually controlled.   RN called to report an episode of emesis that was "dark red."  Patient otherwise hemodynamically stable.   Interim history Admitted with altered mental status.  CT and MRI unremarkable.  EEG obtained showing no seizure activity.  Neurology feels this may be psychiatric in nature.  Lithium level subtherapeutic.  Psychiatry consulted. Assessment & Plan   Acute metabolic encephalopathy -Patient presented with expressive aphasia.   -Today, mental status as well as expressive aphasia appear to be improving -CT head unremarkable -MRI brain also unremarkable for acute findings -EEG showed moderate diffuse encephalopathy, nonspecific etiology.  No seizures or definite elliptic form discharges seen during recording. -UA and chest x-ray unremarkable for infection -Patient does have history of septic arthritis of the right great toe for which she is currently taking doxycycline -Neurology consulted and appreciated-given that there is no organic cause  for altered mental status, feels this may be more psychiatric in nature -Psychiatry consulted and appreciated- recommended not restarting lithium, Abilify or Lamictal.  Continue Seroquel alone.  Feels this may be polypharmacy.   -Lithium level subtherapeutic, 0.17  Emesis -Noted by nursing on admission, however no further evidence or reports of emesis -Patient currently denies any abdominal pain, nausea or vomiting -Gastroccult was ordered however has yet to be obtained -Continue Protonix  Atrial fibrillation -Continue metoprolol -Continue Coumadin, per pharmacy-currently INR 2.1  Vitamin B12 deficiency -B12 167, will replace  Bipolar disorder -Abilify and lithium as well as Lamictal currently held -Continue Seroquel and Lyrica -Psychiatry consulted and appreciated- see discuss above  Hyperlipidemia -Continue statin  Hypothyroidism -Continue Synthroid  Right great toe septic arthritis -Appears stable, continue doxycycline  Obesity -BMI of 36.17 -Patient needs to follow-up with her PCP for lifestyle modifications  Physical deconditioning -PT recommended SNF -TOC made aware  DVT Prophylaxis Coumadin  Code Status: DNR  Family Communication: None at bedside  Disposition Plan:  Status is: Inpatient  Remains inpatient appropriate because:Altered mental status and Unsafe d/c plan  Dispo: The patient is from: Home              Anticipated d/c is to: SNF              Patient currently is medically stable to d/c.   Difficult to place patient No  Consultants Neurology  Procedures  EEG Echocardiogram  Antibiotics   Anti-infectives (From admission, onward)    Start     Dose/Rate Route Frequency Ordered Stop   08/17/20  1330  doxycycline (VIBRA-TABS) tablet 100 mg        100 mg Oral Every 12 hours 08/17/20 1240         Subjective:   Meredith [name] seen and examined today.  Patient upset that she needs to go to SNF.  Feels that she should be able to go home.   Denies current chest pain or shortness of breath, abdominal pain, nausea or vomiting, diarrhea, constipation, dizziness or headache.     Objective:   Vitals:   08/19/20 0028 08/19/20 0429 08/19/20 0848 08/19/20 1229  BP: (!) 131/54 136/61 (!) 118/41 (!) 149/63  Pulse: 62 (!) 58 68 (!) 55  Resp: 18 18 18 16   Temp: 99.1 F (37.3 C) 99.2 F (37.3 C) 97.7 F (36.5 C) 98.1 F (36.7 C)  TempSrc: Oral Oral Oral Oral  SpO2: 100% 93% 95% 98%  Height:        Intake/Output Summary (Last 24 hours) at 08/19/2020 1353 Last data filed at 08/18/2020 1700 Gross per 24 hour  Intake 660.17 ml  Output --  Net 660.17 ml    There were no vitals filed for this visit.  Exam General: Well developed, chronically ill-appearing, NAD HEENT: NCAT, mucous membranes moist.  Cardiovascular: S1 S2 auscultated, RRR Respiratory: Clear to auscultation bilaterally  Abdomen: Soft, nontender, nondistended, + bowel sounds Extremities: warm dry without cyanosis clubbing or edema. R Great toe with mild edema at MTP joint Neuro: AAOx3, able to answer simple questions and follow commands, appears to have expressive aphasia otherwise nonfocal Psych: flat however appropriate   Data Reviewed: I have personally reviewed following labs and imaging studies  CBC: Recent Labs  Lab 08/16/20 1451 08/17/20 1807 08/18/20 0010 08/19/20 0102  WBC 9.9 11.7* 11.1* 8.8  HGB 13.9 13.8 13.0 11.6*  HCT 42.6 40.8 39.5 35.8*  MCV 91.6 89.9 91.9 92.5  PLT 224 227 220 035    Basic Metabolic Panel: Recent Labs  Lab 08/16/20 1451 08/16/20 1556 08/18/20 0010  NA 141  --  140  K 4.1  --  3.9  CL 103  --  106  CO2 26  --  24  GLUCOSE 103*  --  120*  BUN 11  --  26*  CREATININE 0.94  --  1.10*  CALCIUM 10.1  --  9.2  MG  --  2.0  --   PHOS  --  2.7  --     GFR: Estimated Creatinine Clearance: 43.9 mL/min (A) (by C-G formula based on SCr of 1.1 mg/dL (H)). Liver Function Tests: Recent Labs  Lab 08/16/20 1451  AST  14*  ALT 10  ALKPHOS 56  BILITOT 0.8  PROT 7.1  ALBUMIN 4.5    No results for input(s): LIPASE, AMYLASE in the last 168 hours. Recent Labs  Lab 08/16/20 1556  AMMONIA 20    Coagulation Profile: Recent Labs  Lab 08/16/20 1556 08/17/20 1315 08/18/20 0010 08/19/20 0102  INR 2.0* 1.6* 1.7* 2.1*    Cardiac Enzymes: No results for input(s): CKTOTAL, CKMB, CKMBINDEX, TROPONINI in the last 168 hours. BNP (last 3 results) No results for input(s): PROBNP in the last 8760 hours. HbA1C: Recent Labs    08/18/20 0010  HGBA1C 5.9*    CBG: No results for input(s): GLUCAP in the last 168 hours. Lipid Profile: Recent Labs    08/18/20 0010  CHOL 263*  HDL 111  LDLCALC 131*  TRIG 106  CHOLHDL 2.4    Thyroid Function Tests: Recent Labs  08/18/20 0825  TSH 0.539   Anemia Panel: Recent Labs    08/18/20 0825  VITAMINB12 167*  FOLATE 17.4    Urine analysis:    Component Value Date/Time   COLORURINE YELLOW 08/16/2020 1814   APPEARANCEUR CLEAR 08/16/2020 1814   LABSPEC 1.011 08/16/2020 1814   PHURINE 7.0 08/16/2020 1814   GLUCOSEU NEGATIVE 08/16/2020 1814   HGBUR NEGATIVE 08/16/2020 1814   BILIRUBINUR NEGATIVE 08/16/2020 1814   KETONESUR TRACE (A) 08/16/2020 1814   PROTEINUR NEGATIVE 08/16/2020 1814   NITRITE NEGATIVE 08/16/2020 1814   LEUKOCYTESUR TRACE (A) 08/16/2020 1814   Sepsis Labs: @LABRCNTIP (procalcitonin:4,lacticidven:4)  ) Recent Results (from the past 240 hour(s))  Resp Panel by RT-PCR (Flu A&B, Covid) Nasopharyngeal Swab     Status: None   Collection Time: 08/17/20  4:08 AM   Specimen: Nasopharyngeal Swab; Nasopharyngeal(NP) swabs in vial transport medium  Result Value Ref Range Status   SARS Coronavirus 2 by RT PCR NEGATIVE NEGATIVE Final    Comment: (NOTE) SARS-CoV-2 target nucleic acids are NOT DETECTED.  The SARS-CoV-2 RNA is generally detectable in upper respiratory specimens during the acute phase of infection. The  lowest concentration of SARS-CoV-2 viral copies this assay can detect is 138 copies/mL. A negative result does not preclude SARS-Cov-2 infection and should not be used as the sole basis for treatment or other patient management decisions. A negative result may occur with  improper specimen collection/handling, submission of specimen other than nasopharyngeal swab, presence of viral mutation(s) within the areas targeted by this assay, and inadequate number of viral copies(<138 copies/mL). A negative result must be combined with clinical observations, patient history, and epidemiological information. The expected result is Negative.  Fact Sheet for Patients:  EntrepreneurPulse.com.au  Fact Sheet for Healthcare Providers:  IncredibleEmployment.be  This test is no t yet approved or cleared by the [place] FDA and  has been authorized for detection and/or diagnosis of SARS-CoV-2 by FDA under an Emergency Use Authorization (EUA). This EUA will remain  in effect (meaning this test can be used) for the duration of the COVID-19 declaration under Section 564(b)(1) of the Act, 21 U.S.C.section 360bbb-3(b)(1), unless the authorization is terminated  or revoked sooner.       Influenza A by PCR NEGATIVE NEGATIVE Final   Influenza B by PCR NEGATIVE NEGATIVE Final    Comment: (NOTE) The Xpert Xpress SARS-CoV-2/FLU/RSV plus assay is intended as an aid in the diagnosis of influenza from Nasopharyngeal swab specimens and should not be used as a sole basis for treatment. Nasal washings and aspirates are unacceptable for Xpert Xpress SARS-CoV-2/FLU/RSV testing.  Fact Sheet for Patients: EntrepreneurPulse.com.au  Fact Sheet for Healthcare Providers: IncredibleEmployment.be  This test is not yet approved or cleared by the [place] FDA and has been authorized for detection and/or diagnosis of SARS-CoV-2 by FDA under  an Emergency Use Authorization (EUA). This EUA will remain in effect (meaning this test can be used) for the duration of the COVID-19 declaration under Section 564(b)(1) of the Act, 21 U.S.C. section 360bbb-3(b)(1), unless the authorization is terminated or revoked.  Performed at [facility], [address] [place], [place] [address]       Radiology Studies: MR ANGIO HEAD WO CONTRAST  Result Date: 08/17/2020 CLINICAL DATA:  Transient ischemic attack. EXAM: MRI HEAD WITHOUT CONTRAST MRA HEAD WITHOUT CONTRAST TECHNIQUE: Multiplanar, multi-echo pulse sequences of the brain and surrounding structures were acquired without intravenous contrast. Angiographic images of the Circle of Willis were acquired using MRA technique  without intravenous contrast. COMPARISON: No pertinent prior exam. COMPARISON:  Same day CT head.  MRI Jul 15, 2019. FINDINGS: MRI HEAD FINDINGS Mildly motion limited study.  Within this limitation: Brain: No acute infarction, hemorrhage, hydrocephalus, extra-axial collection or mass lesion. Similar mild ventriculomegaly without rounding of the temporal horns. Vascular: See below. Skull and upper cervical spine: Normal marrow signal. Sinuses/Orbits: Clear sinuses.  Unremarkable orbits. Other: No mastoid effusions. MRA HEAD FINDINGS Anterior circulation: Bilateral intracranial ICAs, MCAs, and ACAs are patent without evidence of proximal hemodynamically significant stenosis. No aneurysm identified. Posterior circulation: The visualized intradural vertebral arteries, basilar artery, and posterior cerebral arteries are patent without evidence of hemodynamically significant proximal stenosis. Left fetal type PCA, anatomic variant. No aneurysm identified. Anatomic variants: See above. IMPRESSION: MRI: 1. No evidence of acute intracranial abnormality. Specifically, no acute infarct. 2. Similar appearance of the brain in comparison to 2021 prior MRI, including mild  ventriculomegaly. MRA: No large vessel occlusion or proximal hemodynamically significant stenosis. Electronically Signed   By: Margaretha Sheffield MD   On: 08/17/2020 16:33   MR BRAIN WO CONTRAST  Result Date: 08/17/2020 CLINICAL DATA:  Transient ischemic attack. EXAM: MRI HEAD WITHOUT CONTRAST MRA HEAD WITHOUT CONTRAST TECHNIQUE: Multiplanar, multi-echo pulse sequences of the brain and surrounding structures were acquired without intravenous contrast. Angiographic images of the Circle of Willis were acquired using MRA technique without intravenous contrast. COMPARISON: No pertinent prior exam. COMPARISON:  Same day CT head.  MRI Jul 15, 2019. FINDINGS: MRI HEAD FINDINGS Mildly motion limited study.  Within this limitation: Brain: No acute infarction, hemorrhage, hydrocephalus, extra-axial collection or mass lesion. Similar mild ventriculomegaly without rounding of the temporal horns. Vascular: See below. Skull and upper cervical spine: Normal marrow signal. Sinuses/Orbits: Clear sinuses.  Unremarkable orbits. Other: No mastoid effusions. MRA HEAD FINDINGS Anterior circulation: Bilateral intracranial ICAs, MCAs, and ACAs are patent without evidence of proximal hemodynamically significant stenosis. No aneurysm identified. Posterior circulation: The visualized intradural vertebral arteries, basilar artery, and posterior cerebral arteries are patent without evidence of hemodynamically significant proximal stenosis. Left fetal type PCA, anatomic variant. No aneurysm identified. Anatomic variants: See above. IMPRESSION: MRI: 1. No evidence of acute intracranial abnormality. Specifically, no acute infarct. 2. Similar appearance of the brain in comparison to 2021 prior MRI, including mild ventriculomegaly. MRA: No large vessel occlusion or proximal hemodynamically significant stenosis. Electronically Signed   By: Margaretha Sheffield MD   On: 08/17/2020 16:33   EEG adult  Result Date: 08/17/2020 Lora Havens, MD      08/18/2020  9:56 AM Patient Name: Kaci Dillie MRN: [id number] Epilepsy Attending: Lora Havens Referring Physician/Provider: Dr Karmen Bongo Date: 08/17/2020 Duration: 27.57 mins Patient history: 70 y.o. female who presented to the ED with impaired communication. EEG to evaluate for seizure Level of alertness: Awake AEDs during EEG study: LEV Technical aspects: This EEG study was done with scalp electrodes positioned according to the 10-20 International system of electrode placement. Electrical activity was acquired at a sampling rate of 500Hz  and reviewed with a high frequency filter of 70Hz  and a low frequency filter of 1Hz . EEG data were recorded continuously and digitally stored. Description: The posterior dominant rhythm consists of 6 Hz activity of moderate voltage (25-35 uV) seen predominantly in posterior head regions, symmetric and reactive to eye opening and eye closing. EEG showed continuous generalized 5 to 6 Hz theta as well as intermittent generalized 2-3hz  delta slowing, at times with triphasic morphology. Hyperventilation and photic stimulation were not  performed.   ABNORMALITY - Continuous slow, generalized - Background slow IMPRESSION: This study is suggestive of moderate diffuse encephalopathy, nonspecific etiology. No seizures or definite epileptiform discharges were seen throughout the recording. Priyanka Barbra Sarks   Overnight EEG with video  Result Date: 08/18/2020 Lora Havens, MD     08/18/2020 10:08 AM Patient Name: Denisa Enterline MRN: [id number] Epilepsy Attending: Lora Havens Referring Physician/Provider: Dr Donnetta Simpers Duration: 08/17/2020 1851 to 08/18/2020 0902  Patient history: 70 y.o. female who presented to the ED with impaired communication. EEG to evaluate for seizure  Level of alertness: Awake  AEDs during EEG study: LEV  Technical aspects: This EEG study was done with scalp electrodes positioned according to the 10-20 International system of  electrode placement. Electrical activity was acquired at a sampling rate of 500Hz  and reviewed with a high frequency filter of 70Hz  and a low frequency filter of 1Hz . EEG data were recorded continuously and digitally stored.  Description: The posterior dominant rhythm consists of 6 Hz activity of moderate voltage (25-35 uV) seen predominantly in posterior head regions, symmetric and reactive to eye opening and eye closing. EEG showed continuous generalized 5 to 6 Hz theta as well as intermittent generalized 2-3hz  delta slowing, at times with triphasic morphology. Hyperventilation and photic stimulation were not performed.    ABNORMALITY - Continuous slow, generalized - Background slow  IMPRESSION: This study is suggestive of moderate diffuse encephalopathy, nonspecific etiology. No seizures or definite epileptiform discharges were seen throughout the recording.  Lora Havens   ECHOCARDIOGRAM COMPLETE  Result Date: 08/18/2020    ECHOCARDIOGRAM REPORT   Patient Name:   LAKEYA MULKA Date of Exam: 08/18/2020 Medical Rec #:  [id number]            Height:       59.0 in Accession #:    [id number]           Weight:       179.1 lb Date of Birth:  1950/11/19             BSA:          1.760 m Patient Age:    37 years             BP:           128/62 mmHg Patient Gender: F                    HR:           70 bpm. Exam Location:  Inpatient Procedure: 2D Echo Indications:    TIA  History:        Patient has prior history of Echocardiogram examinations, most                 recent 10/23/2018. Arrythmias:Atrial Fibrillation;                 Signs/Symptoms:Altered Mental Status.  Sonographer:    Johny Chess Referring Phys: [id number] JENNIFER YATES  Sonographer Comments: Suboptimal subcostal window. IMPRESSIONS  1. Left ventricular ejection fraction, by estimation, is 60 to 65%. The left ventricle has normal function. The left ventricle has no regional wall motion abnormalities. Left ventricular diastolic function could  not be evaluated.  2. Right ventricular systolic function is normal. The right ventricular size is normal. There is mildly elevated pulmonary artery systolic pressure.  3. Left atrial size was mildly dilated.  4. The mitral valve is normal in structure. Mild mitral  valve regurgitation. No evidence of mitral stenosis.  5. The aortic valve is tricuspid. Aortic valve regurgitation is mild. No aortic stenosis is present. FINDINGS  Left Ventricle: Left ventricular ejection fraction, by estimation, is 60 to 65%. The left ventricle has normal function. The left ventricle has no regional wall motion abnormalities. The left ventricular internal cavity size was normal in size. There is  no left ventricular hypertrophy. Left ventricular diastolic function could not be evaluated due to atrial fibrillation. Left ventricular diastolic function could not be evaluated. Right Ventricle: The right ventricular size is normal.Right ventricular systolic function is normal. There is mildly elevated pulmonary artery systolic pressure. The tricuspid regurgitant velocity is 3.08 m/s, and with an assumed right atrial pressure of  3 mmHg, the estimated right ventricular systolic pressure is 02.5 mmHg. Left Atrium: Left atrial size was mildly dilated. Right Atrium: Right atrial size was normal in size. Pericardium: There is no evidence of pericardial effusion. Mitral Valve: The mitral valve is normal in structure. Mild mitral valve regurgitation. No evidence of mitral valve stenosis. Tricuspid Valve: The tricuspid valve is normal in structure. Tricuspid valve regurgitation is mild . No evidence of tricuspid stenosis. Aortic Valve: The aortic valve is tricuspid. Aortic valve regurgitation is mild. No aortic stenosis is present. Pulmonic Valve: The pulmonic valve was normal in structure. Pulmonic valve regurgitation is not visualized. No evidence of pulmonic stenosis. Aorta: The aortic root is normal in size and structure. Venous: The inferior  vena cava was not well visualized. IAS/Shunts: The interatrial septum was not well visualized.  LEFT VENTRICLE PLAX 2D LVIDd:         3.90 cm LVIDs:         2.50 cm LV PW:         0.90 cm LV IVS:        1.00 cm LVOT diam:     1.90 cm LV SV:         68 LV SV Index:   39 LVOT Area:     2.84 cm  RIGHT VENTRICLE RV S prime:     16.20 cm/s LEFT ATRIUM             Index       RIGHT ATRIUM           Index LA diam:        3.40 cm 1.93 cm/m  RA Area:     11.80 cm LA Vol (A2C):   59.2 ml 33.64 ml/m RA Volume:   25.20 ml  14.32 ml/m LA Vol (A4C):   59.8 ml 33.98 ml/m LA Biplane Vol: 60.5 ml 34.38 ml/m  AORTIC VALVE LVOT Vmax:   113.00 cm/s LVOT Vmean:  75.700 cm/s LVOT VTI:    0.241 m  AORTA Ao Root diam: 3.00 cm Ao Asc diam:  3.10 cm TRICUSPID VALVE TR Peak grad:   37.9 mmHg TR Vmax:        308.00 cm/s  SHUNTS Systemic VTI:  0.24 m Systemic Diam: 1.90 cm Kirk Ruths MD Electronically signed by Kirk Ruths MD Signature Date/Time: 08/18/2020/12:26:29 PM    Final      Scheduled Meds:  doxycycline  100 mg Oral Q12H   feeding supplement  237 mL Oral TID BM   levothyroxine  112 mcg Oral Q0600   melatonin  20 mg Oral QHS   metoprolol succinate  50 mg Oral Daily   multivitamin with minerals  1 tablet Oral Daily   pantoprazole (PROTONIX) IV  40  mg Intravenous Q12H   pravastatin  20 mg Oral QHS   QUEtiapine  200 mg Oral QHS   senna-docusate  2 tablet Oral BID   [START ON 08/26/2020] thiamine injection  100 mg Intravenous Daily   verapamil  120 mg Oral QHS   vitamin B-12  1,000 mcg Oral Daily   warfarin  3 mg Oral ONCE-1600   Warfarin - Pharmacist Dosing Inpatient   Does not apply q1600   Continuous Infusions:  sodium chloride 50 mL/hr at 08/18/20 1607   thiamine injection 500 mg (08/19/20 0544)   Followed by   Derrill Memo ON 08/20/2020] thiamine injection       LOS: 1 day   Time Spent in minutes   45 minutes  Mahnoor Mathisen D.O. on 08/19/2020 at 1:53 PM  Between 7am to 7pm - Please see pager noted  on [email or web address]  After 7pm go to [email or web address]  And look for the night coverage person covering for me after hours  [facility] Office  [phone]

## 2020-08-19 NOTE — Progress Notes (Signed)
Physical Therapy Treatment Patient Details Name: Meredith [name] MRN: [id number] DOB: Jul 07, 1950 Today's Date: 08/19/2020    History of Present Illness 70 yo female presents to ED on 6/21 with AMS and aphasia. MRI and CT negative. 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  PMH includes bipolar disorder, paroxysmal atrial fibrillation treated with Coumadin, PTSD, stroke, osteoporosis, depression, DM.    PT Comments    Pt reporting headache-type pain at start of session, has received pain meds that "haven't worked" per pt. Pt ambulatory for x2 short hallway distances, requiring seated rest break to recover fatigue. Pt declines LE exercises, states she is too tired. Will continue to follow, continuing to recommend SNF given pt requiring min-mod assist for mobility, remains high fall risk, and has markedly decreased activity tolerance.    Follow Up Recommendations  SNF     Equipment Recommendations  Rolling walker with 5" wheels    Recommendations for Other Services       Precautions / Restrictions Precautions Precautions: Fall Restrictions Weight Bearing Restrictions: No    Mobility  Bed Mobility Overal bed mobility: Needs Assistance Bed Mobility: Sit to Supine;Supine to Sit     Supine to sit: Min assist Sit to supine: Mod assist   General bed mobility comments: min-mod assist for trunk and LE management, repositioning assist in return to supine.    Transfers Overall transfer level: Needs assistance Equipment used: Rolling walker (2 wheeled) Transfers: Sit to/from Stand Sit to Stand: Min assist         General transfer comment: min assist for rise and steady, STS x2.  Ambulation/Gait Ambulation/Gait assistance: Min assist Gait Distance (Feet): 35 Feet (x2) Assistive device: Rolling walker (2 wheeled) Gait Pattern/deviations: Step-through pattern;Decreased stride length;Trunk flexed;Drifts right/left Gait velocity: decr   General Gait Details: min assisst  to steady, verbal cuing for hallway navigation, maintaining straight trajectory in hallway. Seated rest break x2 minutes in hallway   Stairs             Wheelchair Mobility    Modified Rankin (Stroke Patients Only)       Balance Overall balance assessment: Needs assistance Sitting-balance support: No upper extremity supported;Feet supported Sitting balance-Leahy Scale: Fair     Standing balance support: Bilateral upper extremity supported Standing balance-Leahy Scale: Poor Standing balance comment: reliant on UE support                            Cognition Arousal/Alertness: Awake/alert Behavior During Therapy: Flat affect Overall Cognitive Status: Impaired/Different from baseline Area of Impairment: Attention;Memory;Following commands;Problem solving                   Current Attention Level: Selective Memory: Decreased short-term memory Following Commands: Follows one step commands inconsistently;Follows one step commands with increased time     Problem Solving: Slow processing;Decreased initiation;Difficulty sequencing;Requires verbal cues;Requires tactile cues        Exercises      General Comments        Pertinent Vitals/Pain Pain Assessment: Faces Faces Pain Scale: Hurts little more Pain Location: head Pain Descriptors / Indicators: Headache Pain Intervention(s): Limited activity within patient's tolerance;Monitored during session    Home Living                      Prior Function            PT Goals (current goals can now be found in the care  plan section) Acute Rehab PT Goals Patient Stated Goal: Return to PLOF PT Goal Formulation: With patient Time For Goal Achievement: 09/01/20 Potential to Achieve Goals: Good Progress towards PT goals: Progressing toward goals    Frequency    Min 2X/week      PT Plan Current plan remains appropriate    Co-evaluation              AM-PAC PT "6 Clicks"  Mobility   Outcome Measure  Help needed turning from your back to your side while in a flat bed without using bedrails?: A Little Help needed moving from lying on your back to sitting on the side of a flat bed without using bedrails?: A Lot Help needed moving to and from a bed to a chair (including a wheelchair)?: A Lot Help needed standing up from a chair using your arms (e.g., wheelchair or bedside chair)?: A Little Help needed to walk in hospital room?: A Little Help needed climbing 3-5 steps with a railing? : A Lot 6 Click Score: 15    End of Session Equipment Utilized During Treatment: Gait belt Activity Tolerance: Patient tolerated treatment well;Patient limited by fatigue Patient left: in bed;with bed alarm set;with call bell/phone within reach Nurse Communication: Mobility status PT Visit Diagnosis: Other abnormalities of gait and mobility (R26.89)     Time: 1994-1290 PT Time Calculation (min) (ACUTE ONLY): 18 min  Charges:  $Gait Training: 8-22 mins                     Stacie Glaze, PT DPT Acute Rehabilitation Services Pager [phone]  Office [phone]    [name] Matte 08/19/2020, 4:47 PM

## 2020-08-19 NOTE — Consult Note (Signed)
[facility] Consult   Reason for Consult:  Li administration Referring Physician: Cristal Ford, DO Patient Identification: Meredith [name] MRN:  [id number] Principal Diagnosis: Acute encephalopathy Diagnosis:  Principal Problem:   Acute encephalopathy Active Problems:   PAF (paroxysmal atrial fibrillation) (HCC)   Bipolar disorder, curr episode mixed, severe, with psychotic features ([place])   Hypothyroidism   Class 2 obesity due to excess calories with body mass index (BMI) of 36.0 to 36.9 in adult   Septic arthritis of interphalangeal joint of toe of right foot ([place])   Emesis   AMS (altered mental status)   Total Time spent with patient: 45 minutes  Subjective:   Meredith [name] is a 70 y.o. female patient admitted with AMS. Patient has a hx of Bipolar disorder, GAD, PTSD, Panic disorder w/ agoraphobia, mild cognitive impairment, Li toxicity, afib on Coumadin; bipolar; HLD; hypothyroidism; and septic arthritis of the R toe presenting with AMS.  HPI:  On assessment this AM patient's twin brother is with her. Patient is alert and oriented to self, situation, and place not to time. Patient reports that she was taking her medications as prescribed. Patient reports that prior to hospitalization she was sleeping well, had no feelings of worthlessness/guilt/hopelessness, and good energy level, and was eating well. However, patient and brother noted that patient was not doing much with her days and her concentration has been poorer lately. Patient denied SI, HI, and AVH. Patient did endorse hx of manic episodes in her distant past (days without sleep and impulsive behaviors) ; however patient reports she has not had a manic episode in a long time. Patient denies excessive feelings of anxiety and had did not report any hx of trauma which her brother did not disagree.   Twin brother [name] reports that he is concerned that patient has too many medications and that they are  contributing to patient's AMS. He reports that this has happened before and when she is inpatient she gets better when some of her medications were stopped but then she begins them again once outpatient. He reports "her medicines are like her children to her" and is concerned; however he reports that he feels that patient is close to her baseline today.   Past Psychiatric History:  See's Dr. Clovis Pu OP. He reports that patient has a hx of multiple failures on single antipsychotic regimen's "including:   risperidone, Aripiprazole 10 NR Geodon which made her more talkative, Depakote which caused some side effects, Seroquel 600,   lamotrigine 200, lithium 600, carbamazepine, and risperidone insomnia, buspirone. Paxil was sedating. venlafaxine, No lexapro, celexa.   Propranolol NR Buspirone 15 BID "   Per patient and brother patient has had 1-2 psych hospitalizations in her past.   Risk to Self:   NO Risk to Others:   NO Prior Inpatient Therapy:   YES Prior Outpatient Therapy:   YES  Past Medical History:  Past Medical History:  Diagnosis Date   Anxiety    Atrial fibrillation ([place])    Bipolar 2 disorder ([place])    Depression    Hematoma 07/06/2020   History of cardioversion    x2   Hyperlipidemia    Hypothyroidism    Migraine headache    Osteoporosis    Septic arthritis of interphalangeal joint of toe of right foot ([place]) 07/06/2020    Past Surgical History:  Procedure Laterality Date   ABLATION     BACK SURGERY  2014   CARDIOVERSION  12/2013, 01/2014  x2   KNEE SURGERY     ORTHOSCOPIC   LUNG REMOVAL, PARTIAL Right    BENIGN LUNG CYST   SYNOVIAL BIOPSY Right 06/16/2020   Procedure: Right hallux interphalangeal joint arthrotomy and biopsy;  Surgeon: [name], MD;  Location: [facility];  Service: Orthopedics;  Laterality: Right;   THORACIC SPINE SURGERY Right    THUMB ARTHROSCOPY     Family History:  Family History  Problem Relation Age of Onset    Arthritis Mother    Depression Mother    Diabetes Mother    Heart disease Mother    Stroke Mother    Early death Father    Heart disease Father    Atrial fibrillation Brother    Hyperlipidemia Brother    Multiple sclerosis Sister    Alcohol abuse Brother    Asthma Brother    Drug abuse Brother    Hypertension Brother    Mental illness Brother    Family Psychiatric  History: None Social History:  Social History   Substance and Sexual Activity  Alcohol Use Not Currently     Social History   Substance and Sexual Activity  Drug Use No    Social History   Socioeconomic History   Marital status: Single    Spouse name: Not on file   Number of children: 3   Years of education: Not on file   Highest education level: Not on file  Occupational History   Occupation: Retired  Tobacco Use   Smoking status: Never   Smokeless tobacco: Never  Scientific laboratory technician Use: Not on file  Substance and Sexual Activity   Alcohol use: Not Currently   Drug use: No   Sexual activity: Not Currently  Other Topics Concern   Not on file  Social History Narrative   Not on file   Social Determinants of Health   Financial Resource Strain: Not on file  Food Insecurity: Not on file  Transportation Needs: Not on file  Physical Activity: Not on file  Stress: Not on file  Social Connections: Not on file   Additional Social History:    Allergies:   Allergies  Allergen Reactions   Codeine Anaphylaxis    Patient states she can take codeine but not percocet    Adhesive [Tape]     blister   Celebrex [Celecoxib] Hives   Erythromycin Other (See Comments)   Other     Other reaction(s): MAKE HER CRAZY Other reaction(s): rash, high fever,welts Other reaction(s): rash Other reaction(s): severe diarrhea/vomiting Other reaction(s): Unknown   Penicillamine Diarrhea    Other reaction(s): Vomiting   Sulfa Antibiotics Hives   Tricyclic Antidepressants     Doesn't help   Amoxicillin Rash    Ampicillin Rash   Cephalexin Diarrhea   Macrodantin [Nitrofurantoin] Rash   Penicillins Rash    Did it involve swelling of the face/tongue/throat, SOB, or low BP? Yes Did it involve sudden or severe rash/hives, skin peeling, or any reaction on the inside of your mouth or nose? NO Did you need to seek medical attention at a hospital or doctor's office? NO When did it last happen? childhood       If all above answers are "NO", may proceed with cephalosporin use.    Labs:  Results for orders placed or performed during the hospital encounter of 08/16/20 (from the past 48 hour(s))  CBC     Status: Abnormal   Collection Time: 08/17/20  6:07 PM  Result  Value Ref Range   WBC 11.7 (H) 4.0 - 10.5 K/uL   RBC 4.54 3.87 - 5.11 MIL/uL   Hemoglobin 13.8 12.0 - 15.0 g/dL   HCT 40.8 36.0 - 46.0 %   MCV 89.9 80.0 - 100.0 fL   MCH 30.4 26.0 - 34.0 pg   MCHC 33.8 30.0 - 36.0 g/dL   RDW 13.1 11.5 - 15.5 %   Platelets 227 150 - 400 K/uL   nRBC 0.0 0.0 - 0.2 %    Comment: Performed at [facility], [place] [address] [place], [place] [address]  Protime-INR     Status: Abnormal   Collection Time: 08/18/20 12:10 AM  Result Value Ref Range   Prothrombin Time 19.8 (H) 11.4 - 15.2 seconds   INR 1.7 (H) 0.8 - 1.2    Comment: (NOTE) INR goal varies based on device and disease states. Performed at [facility], [place] [address], [place], [place] [address]   CBC     Status: Abnormal   Collection Time: 08/18/20 12:10 AM  Result Value Ref Range   WBC 11.1 (H) 4.0 - 10.5 K/uL   RBC 4.30 3.87 - 5.11 MIL/uL   Hemoglobin 13.0 12.0 - 15.0 g/dL   HCT 39.5 36.0 - 46.0 %   MCV 91.9 80.0 - 100.0 fL   MCH 30.2 26.0 - 34.0 pg   MCHC 32.9 30.0 - 36.0 g/dL   RDW 13.2 11.5 - 15.5 %   Platelets 220 150 - 400 K/uL   nRBC 0.0 0.0 - 0.2 %    Comment: Performed at [facility], [place] [address] [place], [place] [address]  HIV Antibody (routine testing w rflx)     Status: None   Collection Time: 08/18/20  12:10 AM  Result Value Ref Range   HIV Screen 4th Generation wRfx Non Reactive Non Reactive    Comment: Performed at [facility], [place] [address] [place], [place] [address]  Hemoglobin A1c     Status: Abnormal   Collection Time: 08/18/20 12:10 AM  Result Value Ref Range   Hgb A1c MFr Bld 5.9 (H) 4.8 - 5.6 %    Comment: (NOTE) Pre diabetes:          5.7%-6.4%  Diabetes:              >6.4%  Glycemic control for   <7.0% adults with diabetes    Mean Plasma Glucose 122.63 mg/dL    Comment: Performed at [facility] [address], [place], [place] [address]  Lipid panel     Status: Abnormal   Collection Time: 08/18/20 12:10 AM  Result Value Ref Range   Cholesterol 263 (H) 0 - 200 mg/dL   Triglycerides 106 <150 mg/dL   HDL 111 >40 mg/dL   Total CHOL/HDL Ratio 2.4 RATIO   VLDL 21 0 - 40 mg/dL   LDL Cholesterol 131 (H) 0 - 99 mg/dL    Comment:        Total Cholesterol/HDL:CHD Risk Coronary Heart Disease Risk Table                     Men   Women  1/2 Average Risk   3.4   3.3  Average Risk       5.0   4.4  2 X Average Risk   9.6   7.1  3 X Average Risk  23.4   11.0  Use the calculated Patient Ratio above and the CHD Risk Table to determine the patient's CHD Risk.        ATP III CLASSIFICATION (LDL):  <100     mg/dL   Optimal  100-129  mg/dL   Near or Above                    Optimal  130-159  mg/dL   Borderline  160-189  mg/dL   High  >190     mg/dL   Very High Performed at [facility] [address] [place], [place] [address]   Basic metabolic panel     Status: Abnormal   Collection Time: 08/18/20 12:10 AM  Result Value Ref Range   Sodium 140 135 - 145 mmol/L   Potassium 3.9 3.5 - 5.1 mmol/L   Chloride 106 98 - 111 mmol/L   CO2 24 22 - 32 mmol/L   Glucose, Bld 120 (H) 70 - 99 mg/dL    Comment: Glucose reference range applies only to samples taken after fasting for at least 8 hours.   BUN 26 (H) 8 - 23 mg/dL   Creatinine, Ser 1.10 (H)  0.44 - 1.00 mg/dL   Calcium 9.2 8.9 - 10.3 mg/dL   GFR, Estimated 54 (L) >60 mL/min    Comment: (NOTE) Calculated using the CKD-EPI Creatinine Equation (2021)    Anion gap 10 5 - 15    Comment: Performed at [facility] [address] [place], [place] [address]  Ethanol     Status: None   Collection Time: 08/18/20  8:25 AM  Result Value Ref Range   Alcohol, Ethyl (B) <10 <10 mg/dL    Comment: (NOTE) Lowest detectable limit for serum alcohol is 10 mg/dL.  For medical purposes only. Performed at [facility], [place] [address], [place], [place] [address]   Folate     Status: None   Collection Time: 08/18/20  8:25 AM  Result Value Ref Range   Folate 17.4 >5.9 ng/mL    Comment: Performed at [facility] [address] [place], [place] [address]  Rapid HIV screen (HIV 1/2 Ab+Ag)     Status: None   Collection Time: 08/18/20  8:25 AM  Result Value Ref Range   HIV-1 P24 Antigen - HIV24 NON REACTIVE NON REACTIVE    Comment: (NOTE) Detection of p24 may be inhibited by biotin in the sample, causing false negative results in acute infection.    HIV 1/2 Antibodies NON REACTIVE NON REACTIVE   Interpretation (HIV Ag Ab)      A non reactive test result means that HIV 1 or HIV 2 antibodies and HIV 1 p24 antigen were not detected in the specimen.    Comment: Performed at [facility], [place] [address], [place], [place] [address]  RPR     Status: None   Collection Time: 08/18/20  8:25 AM  Result Value Ref Range   RPR Ser Ql NON REACTIVE NON REACTIVE    Comment: Performed at [facility], [place] [address], [place], [place] [address]  Vitamin B12     Status: Abnormal   Collection Time: 08/18/20  8:25 AM  Result Value Ref Range   Vitamin B-12 167 (L) 180 - 914 pg/mL    Comment: (NOTE) This assay is not validated for testing neonatal or myeloproliferative syndrome specimens for Vitamin B12 levels. Performed at [facility], [place]  [address] [place],  [place] [address]   TSH     Status: None   Collection Time: 08/18/20  8:25 AM  Result Value Ref Range   TSH 0.539 0.350 - 4.500 uIU/mL    Comment: Performed by a 3rd Generation assay with a functional sensitivity of <=0.01 uIU/mL. Performed at [facility], [place] [address], [place], [place] [address]   Lithium level     Status: Abnormal   Collection Time: 08/18/20  8:25 AM  Result Value Ref Range   Lithium Lvl 0.17 (L) 0.60 - 1.20 mmol/L    Comment: Performed at [facility] [address], [place], [place] [address]  Protime-INR     Status: Abnormal   Collection Time: 08/19/20  1:02 AM  Result Value Ref Range   Prothrombin Time 23.5 (H) 11.4 - 15.2 seconds   INR 2.1 (H) 0.8 - 1.2    Comment: (NOTE) INR goal varies based on device and disease states. Performed at [facility], [place] [address] [place], [place] [address]   CBC     Status: Abnormal   Collection Time: 08/19/20  1:02 AM  Result Value Ref Range   WBC 8.8 4.0 - 10.5 K/uL   RBC 3.87 3.87 - 5.11 MIL/uL   Hemoglobin 11.6 (L) 12.0 - 15.0 g/dL   HCT 35.8 (L) 36.0 - 46.0 %   MCV 92.5 80.0 - 100.0 fL   MCH 30.0 26.0 - 34.0 pg   MCHC 32.4 30.0 - 36.0 g/dL   RDW 13.3 11.5 - 15.5 %   Platelets 182 150 - 400 K/uL   nRBC 0.0 0.0 - 0.2 %    Comment: Performed at [facility], [place] [address] [place], [place] [address]    Current Facility-Administered Medications  Medication Dose Route Frequency Provider Last Rate Last Admin   0.9 %  sodium chloride infusion   Intravenous Continuous Karmen Bongo, MD 50 mL/hr at 08/18/20 1607 New Bag at 08/18/20 1607   acetaminophen (TYLENOL) tablet 650 mg  650 mg Oral Q4H PRN Karmen Bongo, MD   650 mg at 08/19/20 4627   Or   acetaminophen (TYLENOL) 160 MG/5ML solution 650 mg  650 mg Per Tube Q4H PRN Karmen Bongo, MD       Or   acetaminophen (TYLENOL) suppository 650 mg  650 mg Rectal Q4H PRN Karmen Bongo, MD       doxycycline (VIBRA-TABS) tablet 100 mg  100 mg  Oral Lillia Mountain, MD   100 mg at 08/19/20 0953   feeding supplement (ENSURE ENLIVE / ENSURE PLUS) liquid 237 mL  237 mL Oral TID BM Cristal Ford, DO       levothyroxine (SYNTHROID) tablet 112 mcg  112 mcg Oral Q0600 Karmen Bongo, MD   112 mcg at 08/19/20 0539   melatonin tablet 20 mg  20 mg Oral Ivery Quale, MD   20 mg at 08/18/20 2125   metoprolol succinate (TOPROL-XL) 24 hr tablet 50 mg  50 mg Oral Daily Karmen Bongo, MD   50 mg at 08/19/20 0350   multivitamin with minerals tablet 1 tablet  1 tablet Oral Daily Cristal Ford, DO   1 tablet at 08/19/20 0954   ondansetron [facility]) injection 4 mg  4 mg Intravenous Q6H PRN Karmen Bongo, MD   4 mg at 08/19/20 0817   pantoprazole (PROTONIX) injection 40 mg  40 mg Intravenous Lillia Mountain, MD   40 mg at 08/19/20 [phone]  pravastatin (PRAVACHOL) tablet 20 mg  20 mg Oral QHS Karmen Bongo, MD   20 mg at 08/18/20 2125   QUEtiapine (SEROQUEL) tablet 200 mg  200 mg Oral Ivery Quale, MD   200 mg at 08/18/20 2125   senna-docusate (Senokot-S) tablet 1 tablet  1 tablet Oral QHS PRN Karmen Bongo, MD       senna-docusate (Senokot-S) tablet 2 tablet  2 tablet Oral BID Karmen Bongo, MD   2 tablet at 08/19/20 1601   thiamine 500mg  in normal saline (27ml) IVPB  500 mg Intravenous Artist Beach, MD 100 mL/hr at 08/19/20 0544 500 mg at 08/19/20 0544   Followed by   Derrill Memo ON 08/20/2020] thiamine (B-1) 250 mg in sodium chloride 0.9 % 50 mL IVPB  250 mg Intravenous Daily Donnetta Simpers, MD       Followed by   Derrill Memo ON 08/26/2020] thiamine (B-1) injection 100 mg  100 mg Intravenous Daily Donnetta Simpers, MD       verapamil (CALAN-SR) CR tablet 120 mg  120 mg Oral Ivery Quale, MD   120 mg at 08/18/20 2145   vitamin B-12 (CYANOCOBALAMIN) tablet 1,000 mcg  1,000 mcg Oral Daily Donnetta Simpers, MD   1,000 mcg at 08/19/20 0932   warfarin (COUMADIN) tablet 3 mg  3 mg Oral ONCE-1600 Cristal Ford,  DO       Warfarin - Pharmacist Dosing Inpatient   Does not apply [id number] Karmen Bongo, MD   Given at 08/18/20 1617    Musculoskeletal: Strength & Muscle Tone: within normal limits Gait & Station:  remains in bed on exam Patient leans: N/A            Psychiatric Specialty Exam:  Presentation  General Appearance: Appropriate for Environment  Eye Contact:Good  Speech:Clear and Coherent  Speech Volume:Normal  Handedness: No data recorded  Mood and Affect  Mood:Euthymic  Affect:Appropriate   Thought Process  Thought Processes:Goal Directed And some thought blocking  Descriptions of Associations:Circumstantial  Orientation:-- (oriented to person, situation and place but not time)  Thought Content:Logical  History of Schizophrenia/Schizoaffective disorder:No data recorded Duration of Psychotic Symptoms:No data recorded Hallucinations:Hallucinations: None  Ideas of Reference:None  Suicidal Thoughts:Suicidal Thoughts: No  Homicidal Thoughts:Homicidal Thoughts: No   Sensorium  Memory:Immediate Poor; Recent Fair; Remote Fair  Judgment:-- (Improving)  Insight:None   Executive Functions  Concentration:Fair  Attention Span:Fair  [place]  Language:Good   Psychomotor Activity  Psychomotor Activity:Psychomotor Activity: Normal   Assets  Assets:Communication Skills; Desire for Improvement; Social Support; Resilience   Sleep  Sleep:Sleep: Good   Physical Exam: Physical Exam Constitutional:      Appearance: Normal appearance.  HENT:     Head: Normocephalic and atraumatic.  Eyes:     Extraocular Movements: Extraocular movements intact.     Conjunctiva/sclera: Conjunctivae normal.  Cardiovascular:     Rate and Rhythm: Normal rate.  Pulmonary:     Effort: Pulmonary effort is normal.     Breath sounds: Normal breath sounds.  Abdominal:     General: Abdomen is flat.  Musculoskeletal:        General:  Normal range of motion.  Skin:    General: Skin is warm and dry.  Neurological:     Mental Status: She is alert. She is disoriented.   Review of Systems  Constitutional:  Negative for chills and fever.  HENT:  Negative for hearing loss.   Eyes:  Negative for blurred vision.  Respiratory:  Negative  for cough and wheezing.   Cardiovascular:  Negative for chest pain.  Gastrointestinal:  Negative for abdominal pain.  Neurological:  Negative for dizziness.  Psychiatric/Behavioral:  Positive for memory loss. Negative for hallucinations and suicidal ideas.   Blood pressure (!) 149/63, pulse (!) 55, temperature 98.1 F (36.7 C), temperature source Oral, resp. rate 16, height 4\' 11"  (1.499 m), SpO2 98 %. Body mass index is 36.17 kg/m.  Treatment Plan Summary: Medication management AMS Bipolar disorder, remission PTSD GAD Patient appears to be stable at this time although she is still disoriented a bit, but her brother noted that with her MCI she has been having difficulty with this prior to hospitalization. 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 Patient's Li level was subtherapeutic (0.17) on admission and TSH was normal, patient claims to be taking her medicine correctly however with her memory issues and hx of accidental toxicity I find this unlikely and do not recommend continuing this medication. Patient also has CKDIII again another reason to not restart Li. Patient appears to have polypharmacy and at her age and with her MCI it would be best to decrease the medication use especially as her hospitalization has decreased some of her home meds and she is getting better. Patient appears stable on Seroquel alone. Would recommend that patient Abilify and Lamictal not be continued at discharge as there does not appear to be an indication for 2 antipsychotics at this  time and patient appears to be able to use Seroquel alone for maintenance therapy. - Continue Seroquel 200mg  QHS - Do not recommend restarting Li, Abilify, or Lamictal   Patient is determined to be psychiatrically stable at this time. Psychiatry will sign off. Please do not hesitate to call back if questions arise. Thank you for this consult.   Disposition:  Per primary team. Recommend patient f/u with Dr. Clovis Pu OP.  PGY-1 Freida Busman, MD 08/19/2020 1:49 PM

## 2020-08-20 LAB — CBC
HCT: 35.2 % — ABNORMAL LOW (ref 36.0–46.0)
Hemoglobin: 11.9 g/dL — ABNORMAL LOW (ref 12.0–15.0)
MCH: 30.5 pg (ref 26.0–34.0)
MCHC: 33.8 g/dL (ref 30.0–36.0)
MCV: 90.3 fL (ref 80.0–100.0)
Platelets: 153 10*3/uL (ref 150–400)
RBC: 3.9 MIL/uL (ref 3.87–5.11)
RDW: 12.8 % (ref 11.5–15.5)
WBC: 10.7 10*3/uL — ABNORMAL HIGH (ref 4.0–10.5)
nRBC: 0 % (ref 0.0–0.2)

## 2020-08-20 LAB — PROTIME-INR
INR: 2.6 — ABNORMAL HIGH (ref 0.8–1.2)
Prothrombin Time: 28 seconds — ABNORMAL HIGH (ref 11.4–15.2)

## 2020-08-20 MED ORDER — WARFARIN SODIUM 3 MG PO TABS
3.0000 mg | ORAL_TABLET | Freq: Once | ORAL | Status: AC
  Administered 2020-08-20: 3 mg via ORAL
  Filled 2020-08-20: qty 1

## 2020-08-20 MED ORDER — PROCHLORPERAZINE EDISYLATE 10 MG/2ML IJ SOLN
7.5000 mg | Freq: Once | INTRAMUSCULAR | Status: AC
  Administered 2020-08-20: 7.5 mg via INTRAVENOUS
  Filled 2020-08-20: qty 2

## 2020-08-20 NOTE — Plan of Care (Signed)
  Problem: Education: Goal: Knowledge of General Education information will improve Description Including pain rating scale, medication(s)/side effects and non-pharmacologic comfort measures Outcome: Progressing   Problem: Health Behavior/Discharge Planning: Goal: Ability to manage health-related needs will improve Outcome: Progressing   

## 2020-08-20 NOTE — Progress Notes (Signed)
ANTICOAGULATION CONSULT NOTE - Consult  Pharmacy Consult for warfarin  Indication: atrial fibrillation   Patient Measurements: Height: 4\' 11"  (149.9 cm) IBW/kg (Calculated) : 43.2   Vital Signs: Temp: 97.7 F (36.5 C) (06/25 0801) Temp Source: Oral (06/25 0801) BP: 106/60 (06/25 0801) Pulse Rate: 60 (06/25 0801)   Labs: Recent Labs    08/18/20 0010 08/19/20 0102 08/20/20 0049  HGB 13.0 11.6* 11.9*  HCT 39.5 35.8* 35.2*  PLT 220 182 153  LABPROT 19.8* 23.5* 28.0*  INR 1.7* 2.1* 2.6*  CREATININE 1.10*  --   --     CrCl cannot be calculated (Unknown ideal weight.).   Medical History: Past Medical History:  Diagnosis Date   Anxiety    Atrial fibrillation (HCC)    Bipolar 2 disorder ([place])    Depression    Hematoma 07/06/2020   History of cardioversion    x2   Hyperlipidemia    Hypothyroidism    Migraine headache    Osteoporosis    Septic arthritis of interphalangeal joint of toe of right foot ([place]) 07/06/2020    Medications:  Scheduled:   doxycycline  100 mg Oral Q12H   feeding supplement  237 mL Oral TID BM   levothyroxine  112 mcg Oral Q0600   melatonin  20 mg Oral QHS   metoprolol succinate  50 mg Oral Daily   multivitamin with minerals  1 tablet Oral Daily   pantoprazole (PROTONIX) IV  40 mg Intravenous Q12H   pravastatin  20 mg Oral QHS   QUEtiapine  200 mg Oral QHS   senna-docusate  2 tablet Oral BID   [START ON 08/26/2020] thiamine injection  100 mg Intravenous Daily   verapamil  120 mg Oral QHS   vitamin B-12  1,000 mcg Oral Daily   Warfarin - Pharmacist Dosing Inpatient   Does not apply q1600    Assessment: 70 year old female presented to [facility]/16/20 with AMS. Patient takes warfarin PTA for history of atrial fibrillation. PTA med history difficult to obtain from patient and family. Last dose of warfarin unknown. However, patient's last warfarin clinic appointment on 08/04/20 shows regimen of warfarin 3.75 mg daily.   Patient's INR was  therapeutic on admission, but trended down. INR today is therapeutic at 2.6. Pt is currently on Doxycycline. PO intake is low, but may be baseline. No bleeding noted. CBC stable.   Goal of Therapy:  INR 2-3 Monitor platelets by anticoagulation protocol: Yes   Plan:  Give warfarin 3 mg po x 1 Monitor daily INR, CBC, clinical course, s/sx of bleed, PO intake/diet, Drug-Drug Interactions   Thank you for allowing [place] to participate in this patients care. Jens Som, PharmD 08/20/2020 11:45 AM  Please check [email or web address] for unit-specific pharmacy phone numbers.

## 2020-08-20 NOTE — Progress Notes (Signed)
SLP Cancellation Note  Patient Details Name: Meredith [name] MRN: [id number] DOB: [birth year]   Cancelled treatment:       Reason Eval/Treat Not Completed: Patient declined eval at this time. SLP offered cognitive-linguistic evaluation. Pt and her brother both report improvements in communication since admission and prefer to defer testing acutely. Note that MRI was also negative. Plan is for d/c to SNF - pt and brother also aware that testing could be done at SNF if any symptoms persist. Will s/o for now.    Osie Bond., M.A. [facility] Pager [phone] Office [phone]  08/20/2020, 10:29 AM

## 2020-08-20 NOTE — Progress Notes (Signed)
PROGRESS NOTE    Meredith [name]  JJK:[id number] DOB: March 19, 1950 DOA: 08/16/2020 PCP: Kristen Loader, FNP   Brief Narrative:  HPI on 08/17/2020 by Dr. Lowell Guitar Meredith [name] is a 70 y.o. female with medical history significant of afib on Coumadin; bipolar; HLD; hypothyroidism; and septic arthritis of the R toe presenting with AMS.  Her brother reports that can't put her thoughts together and communicate and her R leg is shaking.  Her brother was away over the weekend and texted and called when he came back without reply.  Her pastor recommended getting her evaluated.  She is alert and able to talk but can't really put thoughts together.  She was not having difficulty caring for herself.  He got a text from her Saturday for Father's Day but he hadn't actually spoken to her in almost 2 weeks.  No one had spoken to her in the interim.  She also has abdominal pain, fairly new, just TTP.  He is uncertain if her bipolar is usually controlled.   RN called to report an episode of emesis that was "dark red."  Patient otherwise hemodynamically stable.   Interim history Admitted with altered mental status.  CT and MRI unremarkable.  EEG obtained showing no seizure activity.  Neurology feels this may be psychiatric in nature.  Lithium level subtherapeutic.  Psychiatry consulted. Assessment & Plan   Acute metabolic encephalopathy -Patient presented with expressive aphasia.   -Today, mental status as well as expressive aphasia appear to be improving -CT head unremarkable -MRI brain also unremarkable for acute findings -EEG showed moderate diffuse encephalopathy, nonspecific etiology.  No seizures or definite elliptic form discharges seen during recording. -UA and chest x-ray unremarkable for infection -Patient does have history of septic arthritis of the right great toe for which she is currently taking doxycycline -Neurology consulted and appreciated-given that there is no organic cause  for altered mental status, feels this may be more psychiatric in nature -Psychiatry consulted and appreciated- recommended not restarting lithium, Abilify or Lamictal.  Continue Seroquel alone.  Feels this may be polypharmacy.   -Lithium level subtherapeutic, 0.17  Emesis -Noted by nursing on admission, however no further evidence or reports of emesis -Patient currently denies any abdominal pain, nausea or vomiting -Gastroccult was ordered however has yet to be obtained -Continue Protonix  Atrial fibrillation -Continue metoprolol -Continue Coumadin, per pharmacy-currently INR 2.6  Vitamin B12 deficiency -B12 167, will replace  Bipolar disorder -Abilify and lithium as well as Lamictal currently held -Continue Seroquel and Lyrica -Psychiatry consulted and appreciated- see discuss above  Hyperlipidemia -Continue statin  Hypothyroidism -Continue Synthroid  Right great toe septic arthritis -Appears stable, continue doxycycline  Obesity -BMI of 36.17 -Patient needs to follow-up with her PCP for lifestyle modifications  Physical deconditioning -PT recommended SNF -TOC made aware  DVT Prophylaxis Coumadin  Code Status: DNR  Family Communication: None at bedside  Disposition Plan:  Status is: Inpatient  Remains inpatient appropriate because:Altered mental status and Unsafe d/c plan  Dispo: The patient is from: Home              Anticipated d/c is to: SNF              Patient currently is medically stable to d/c.   Difficult to place patient No  Consultants Neurology  Procedures  EEG Echocardiogram  Antibiotics   Anti-infectives (From admission, onward)    Start     Dose/Rate Route Frequency Ordered Stop   08/17/20  1330  doxycycline (VIBRA-TABS) tablet 100 mg        100 mg Oral Every 12 hours 08/17/20 1240         Subjective:   Meredith [name] seen and examined today.  Patient wants to go home and take a shower. She denies chest pain, shortness of  breath, abdominal pain, N/V/C/D, dizziness,headache.   Objective:   Vitals:   08/20/20 0013 08/20/20 0426 08/20/20 0801 08/20/20 1253  BP: 124/70 (!) 117/53 106/60 (!) 102/56  Pulse: 65 63 60 65  Resp: 18 18 16 16   Temp: 98.9 F (37.2 C) 98.7 F (37.1 C) 97.7 F (36.5 C)   TempSrc: Oral Oral Oral   SpO2: 94% 93% 95% 93%  Height:        Intake/Output Summary (Last 24 hours) at 08/20/2020 1353 Last data filed at 08/20/2020 0023 Gross per 24 hour  Intake 240 ml  Output 700 ml  Net -460 ml    There were no vitals filed for this visit.  Exam General: Well developed, chronically ill-appearing, NAD HEENT: NCAT, mucous membranes moist.  Cardiovascular: S1 S2 auscultated, RRR Respiratory: Clear to auscultation bilaterally  Abdomen: Soft, nontender, nondistended, + bowel sounds Extremities: warm dry without cyanosis clubbing or edema. R Great toe with mild edema at MTP joint Neuro: AAOx3, nonfocal, aphasia resolved  Psych: Appropriate mood and affect   Data Reviewed: I have personally reviewed following labs and imaging studies  CBC: Recent Labs  Lab 08/16/20 1451 08/17/20 1807 08/18/20 0010 08/19/20 0102 08/20/20 0049  WBC 9.9 11.7* 11.1* 8.8 10.7*  HGB 13.9 13.8 13.0 11.6* 11.9*  HCT 42.6 40.8 39.5 35.8* 35.2*  MCV 91.6 89.9 91.9 92.5 90.3  PLT 224 227 220 182 242    Basic Metabolic Panel: Recent Labs  Lab 08/16/20 1451 08/16/20 1556 08/18/20 0010  NA 141  --  140  K 4.1  --  3.9  CL 103  --  106  CO2 26  --  24  GLUCOSE 103*  --  120*  BUN 11  --  26*  CREATININE 0.94  --  1.10*  CALCIUM 10.1  --  9.2  MG  --  2.0  --   PHOS  --  2.7  --     GFR: CrCl cannot be calculated (Unknown ideal weight.). Liver Function Tests: Recent Labs  Lab 08/16/20 1451  AST 14*  ALT 10  ALKPHOS 56  BILITOT 0.8  PROT 7.1  ALBUMIN 4.5    No results for input(s): LIPASE, AMYLASE in the last 168 hours. Recent Labs  Lab 08/16/20 1556  AMMONIA 20     Coagulation Profile: Recent Labs  Lab 08/16/20 1556 08/17/20 1315 08/18/20 0010 08/19/20 0102 08/20/20 0049  INR 2.0* 1.6* 1.7* 2.1* 2.6*    Cardiac Enzymes: No results for input(s): CKTOTAL, CKMB, CKMBINDEX, TROPONINI in the last 168 hours. BNP (last 3 results) No results for input(s): PROBNP in the last 8760 hours. HbA1C: Recent Labs    08/18/20 0010  HGBA1C 5.9*    CBG: No results for input(s): GLUCAP in the last 168 hours. Lipid Profile: Recent Labs    08/18/20 0010  CHOL 263*  HDL 111  LDLCALC 131*  TRIG 106  CHOLHDL 2.4    Thyroid Function Tests: Recent Labs    08/18/20 0825  TSH 0.539    Anemia Panel: Recent Labs    08/18/20 0825  VITAMINB12 167*  FOLATE 17.4    Urine analysis:    Component  Value Date/Time   COLORURINE YELLOW 08/16/2020 1814   APPEARANCEUR CLEAR 08/16/2020 1814   LABSPEC 1.011 08/16/2020 1814   PHURINE 7.0 08/16/2020 1814   GLUCOSEU NEGATIVE 08/16/2020 1814   HGBUR NEGATIVE 08/16/2020 1814   BILIRUBINUR NEGATIVE 08/16/2020 1814   KETONESUR TRACE (A) 08/16/2020 1814   PROTEINUR NEGATIVE 08/16/2020 1814   NITRITE NEGATIVE 08/16/2020 1814   LEUKOCYTESUR TRACE (A) 08/16/2020 1814   Sepsis Labs: @LABRCNTIP (procalcitonin:4,lacticidven:4)  ) Recent Results (from the past 240 hour(s))  Resp Panel by RT-PCR (Flu A&B, Covid) Nasopharyngeal Swab     Status: None   Collection Time: 08/17/20  4:08 AM   Specimen: Nasopharyngeal Swab; Nasopharyngeal(NP) swabs in vial transport medium  Result Value Ref Range Status   SARS Coronavirus 2 by RT PCR NEGATIVE NEGATIVE Final    Comment: (NOTE) SARS-CoV-2 target nucleic acids are NOT DETECTED.  The SARS-CoV-2 RNA is generally detectable in upper respiratory specimens during the acute phase of infection. The lowest concentration of SARS-CoV-2 viral copies this assay can detect is 138 copies/mL. A negative result does not preclude SARS-Cov-2 infection and should not be used as the  sole basis for treatment or other patient management decisions. A negative result may occur with  improper specimen collection/handling, submission of specimen other than nasopharyngeal swab, presence of viral mutation(s) within the areas targeted by this assay, and inadequate number of viral copies(<138 copies/mL). A negative result must be combined with clinical observations, patient history, and epidemiological information. The expected result is Negative.  Fact Sheet for Patients:  EntrepreneurPulse.com.au  Fact Sheet for Healthcare Providers:  IncredibleEmployment.be  This test is no t yet approved or cleared by the [place] FDA and  has been authorized for detection and/or diagnosis of SARS-CoV-2 by FDA under an Emergency Use Authorization (EUA). This EUA will remain  in effect (meaning this test can be used) for the duration of the COVID-19 declaration under Section 564(b)(1) of the Act, 21 U.S.C.section 360bbb-3(b)(1), unless the authorization is terminated  or revoked sooner.       Influenza A by PCR NEGATIVE NEGATIVE Final   Influenza B by PCR NEGATIVE NEGATIVE Final    Comment: (NOTE) The Xpert Xpress SARS-CoV-2/FLU/RSV plus assay is intended as an aid in the diagnosis of influenza from Nasopharyngeal swab specimens and should not be used as a sole basis for treatment. Nasal washings and aspirates are unacceptable for Xpert Xpress SARS-CoV-2/FLU/RSV testing.  Fact Sheet for Patients: EntrepreneurPulse.com.au  Fact Sheet for Healthcare Providers: IncredibleEmployment.be  This test is not yet approved or cleared by the [place] FDA and has been authorized for detection and/or diagnosis of SARS-CoV-2 by FDA under an Emergency Use Authorization (EUA). This EUA will remain in effect (meaning this test can be used) for the duration of the COVID-19 declaration under Section 564(b)(1) of the  Act, 21 U.S.C. section 360bbb-3(b)(1), unless the authorization is terminated or revoked.  Performed at [facility], [address] [place], [place] [address]       Radiology Studies: No results found.   Scheduled Meds:  doxycycline  100 mg Oral Q12H   feeding supplement  237 mL Oral TID BM   levothyroxine  112 mcg Oral Q0600   melatonin  20 mg Oral QHS   metoprolol succinate  50 mg Oral Daily   multivitamin with minerals  1 tablet Oral Daily   pantoprazole (PROTONIX) IV  40 mg Intravenous Q12H   pravastatin  20 mg Oral QHS   QUEtiapine  200 mg  Oral QHS   senna-docusate  2 tablet Oral BID   [START ON 08/26/2020] thiamine injection  100 mg Intravenous Daily   verapamil  120 mg Oral QHS   vitamin B-12  1,000 mcg Oral Daily   warfarin  3 mg Oral ONCE-1600   Warfarin - Pharmacist Dosing Inpatient   Does not apply q1600   Continuous Infusions:  thiamine injection 250 mg (08/20/20 1056)     LOS: 2 days   Time Spent in minutes   30 minutes  Marilu Rylander D.O. on 08/20/2020 at 1:53 PM  Between 7am to 7pm - Please see pager noted on [email or web address]  After 7pm go to [email or web address]  And look for the night coverage person covering for me after hours  [facility] Office  [phone]

## 2020-08-21 DIAGNOSIS — R4182 Altered mental status, unspecified: Secondary | ICD-10-CM

## 2020-08-21 LAB — PROTIME-INR
INR: 2.4 — ABNORMAL HIGH (ref 0.8–1.2)
Prothrombin Time: 26.4 seconds — ABNORMAL HIGH (ref 11.4–15.2)

## 2020-08-21 MED ORDER — WITCH HAZEL-GLYCERIN EX PADS
MEDICATED_PAD | CUTANEOUS | Status: DC | PRN
  Filled 2020-08-21 (×2): qty 100

## 2020-08-21 MED ORDER — SODIUM CHLORIDE 0.9 % IV SOLN
12.5000 mg | Freq: Three times a day (TID) | INTRAVENOUS | Status: DC | PRN
  Administered 2020-08-21: 12.5 mg via INTRAVENOUS
  Filled 2020-08-21 (×2): qty 0.5

## 2020-08-21 MED ORDER — WARFARIN SODIUM 3 MG PO TABS
3.0000 mg | ORAL_TABLET | Freq: Once | ORAL | Status: AC
  Administered 2020-08-21: 3 mg via ORAL
  Filled 2020-08-21: qty 1

## 2020-08-21 MED ORDER — FLUCONAZOLE 150 MG PO TABS
150.0000 mg | ORAL_TABLET | Freq: Every day | ORAL | Status: DC
  Administered 2020-08-21: 150 mg via ORAL
  Filled 2020-08-21 (×2): qty 1

## 2020-08-21 MED ORDER — METOCLOPRAMIDE HCL 5 MG/ML IJ SOLN
5.0000 mg | Freq: Once | INTRAMUSCULAR | Status: AC
  Administered 2020-08-21: 5 mg via INTRAVENOUS
  Filled 2020-08-21: qty 2

## 2020-08-21 MED ORDER — PREGABALIN 75 MG PO CAPS
150.0000 mg | ORAL_CAPSULE | Freq: Three times a day (TID) | ORAL | Status: DC
  Administered 2020-08-21 (×3): 150 mg via ORAL
  Filled 2020-08-21 (×4): qty 2

## 2020-08-21 MED ORDER — HYDROCODONE-ACETAMINOPHEN 7.5-325 MG PO TABS
1.0000 | ORAL_TABLET | Freq: Three times a day (TID) | ORAL | Status: DC | PRN
  Administered 2020-08-22 – 2020-08-23 (×2): 1 via ORAL
  Filled 2020-08-21 (×2): qty 1

## 2020-08-21 NOTE — Plan of Care (Signed)
  Problem: Education: Goal: Knowledge of General Education information will improve Description Including pain rating scale, medication(s)/side effects and non-pharmacologic comfort measures Outcome: Progressing   Problem: Health Behavior/Discharge Planning: Goal: Ability to manage health-related needs will improve Outcome: Progressing   

## 2020-08-21 NOTE — Progress Notes (Signed)
PROGRESS NOTE    Davetta Olliff  DJM:[id number] DOB: 29-Oct-1950 DOA: 08/16/2020 PCP: Kristen Loader, FNP   Brief Narrative:  HPI on 08/17/2020 by Dr. Lowell Guitar Meredith [name] is a 70 y.o. female with medical history significant of afib on Coumadin; bipolar; HLD; hypothyroidism; and septic arthritis of the R toe presenting with AMS.  Her brother reports that can't put her thoughts together and communicate and her R leg is shaking.  Her brother was away over the weekend and texted and called when he came back without reply.  Her pastor recommended getting her evaluated.  She is alert and able to talk but can't really put thoughts together.  She was not having difficulty caring for herself.  He got a text from her Saturday for Father's Day but he hadn't actually spoken to her in almost 2 weeks.  No one had spoken to her in the interim.  She also has abdominal pain, fairly new, just TTP.  He is uncertain if her bipolar is usually controlled.   RN called to report an episode of emesis that was "dark red."  Patient otherwise hemodynamically stable.   Interim history Admitted with altered mental status.  CT and MRI unremarkable.  EEG obtained showing no seizure activity.  Neurology feels this may be psychiatric in nature.  Lithium level subtherapeutic.  Psychiatry consulted. Assessment & Plan   Acute metabolic encephalopathy -Patient presented with expressive aphasia.   -Today, mental status as well as expressive aphasia appear to be improving -CT head unremarkable -MRI brain also unremarkable for acute findings -EEG showed moderate diffuse encephalopathy, nonspecific etiology.  No seizures or definite elliptic form discharges seen during recording. -UA and chest x-ray unremarkable for infection -Patient does have history of septic arthritis of the right great toe for which she is currently taking doxycycline -Neurology consulted and appreciated-given that there is no organic cause  for altered mental status, feels this may be more psychiatric in nature -Psychiatry consulted and appreciated- recommended not restarting lithium, Abilify or Lamictal.  Continue Seroquel alone.  Feels this may be polypharmacy.   -Lithium level subtherapeutic, 0.17  Emesis -Noted by nursing on admission, however no further evidence or reports of emesis -Patient stated she had nausea last night followed by multiple episodes of vomiting -Currently she denies abdominal pain further nausea or vomiting -Continue Protonix -Continue antiemetics as needed  Atrial fibrillation -Continue metoprolol -Continue Coumadin, per pharmacy-currently INR 2.4  Vitamin B12 deficiency -B12 167, continue supplementation  Bipolar disorder -Abilify and lithium as well as Lamictal currently held -Continue Seroquel and Lyrica -Psychiatry consulted and appreciated- see discuss above  Hyperlipidemia -Continue statin  Hypothyroidism -Continue Synthroid  Chronic pain -Continue Lyrica and hydrocodone  Vaginal irritation -will order one dose of diflucan   Right great toe septic arthritis -Appears stable, continue doxycycline  Obesity -BMI of 36.17 -Patient needs to follow-up with her PCP for lifestyle modifications  Physical deconditioning -PT recommended SNF -TOC made aware  DVT Prophylaxis Coumadin  Code Status: DNR  Family Communication: None at bedside  Disposition Plan:  Status is: Inpatient  Remains inpatient appropriate because:Altered mental status and Unsafe d/c plan  Dispo: The patient is from: Home              Anticipated d/c is to: SNF              Patient currently is medically stable to d/c.   Difficult to place patient No  Consultants Neurology  Procedures  EEG  Echocardiogram  Antibiotics   Anti-infectives (From admission, onward)    Start     Dose/Rate Route Frequency Ordered Stop   08/21/20 1000  fluconazole (DIFLUCAN) tablet 150 mg        150 mg Oral Daily  08/21/20 0901     08/17/20 1330  doxycycline (VIBRA-TABS) tablet 100 mg        100 mg Oral Every 12 hours 08/17/20 1240         Subjective:   Inda Castle seen and examined today.  Complains of weakness.  Also would like to know why her home medications for pain have not been restarted.  States she also had nausea with vomiting for several hours overnight.  Denies current chest pain, shortness of breath, abdominal pain, nausea or vomiting at this time, dizziness or headache.   Objective:   Vitals:   08/20/20 2326 08/21/20 0400 08/21/20 0746 08/21/20 1258  BP: (!) 129/50 (!) 110/58 (!) 121/59 (!) 97/42  Pulse: 65 60 60 97  Resp: 18 17 18 17   Temp: 99 F (37.2 C) 98.9 F (37.2 C) 98.9 F (37.2 C) (!) 97.5 F (36.4 C)  TempSrc: Oral  Oral Oral  SpO2: 98% 98% 97% 97%  Height:       No intake or output data in the 24 hours ending 08/21/20 1332  There were no vitals filed for this visit.  Exam General: Well developed, chronically ill-appearing, NAD HEENT: NCAT, mucous membranes moist.  Cardiovascular: S1 S2 auscultated, RRR Respiratory: Clear to auscultation bilaterally  Abdomen: Soft, nontender, nondistended, + bowel sounds Extremities: warm dry without cyanosis clubbing or edema. R Great toe with mild edema at MTP joint Neuro: AAOx3, nonfocal, aphasia resolved  Psych: Appropriate mood and affect, pleasant    Data Reviewed: I have personally reviewed following labs and imaging studies  CBC: Recent Labs  Lab 08/16/20 1451 08/17/20 1807 08/18/20 0010 08/19/20 0102 08/20/20 0049  WBC 9.9 11.7* 11.1* 8.8 10.7*  HGB 13.9 13.8 13.0 11.6* 11.9*  HCT 42.6 40.8 39.5 35.8* 35.2*  MCV 91.6 89.9 91.9 92.5 90.3  PLT 224 227 220 182 683    Basic Metabolic Panel: Recent Labs  Lab 08/16/20 1451 08/16/20 1556 08/18/20 0010  NA 141  --  140  K 4.1  --  3.9  CL 103  --  106  CO2 26  --  24  GLUCOSE 103*  --  120*  BUN 11  --  26*  CREATININE 0.94  --  1.10*  CALCIUM  10.1  --  9.2  MG  --  2.0  --   PHOS  --  2.7  --     GFR: CrCl cannot be calculated (Unknown ideal weight.). Liver Function Tests: Recent Labs  Lab 08/16/20 1451  AST 14*  ALT 10  ALKPHOS 56  BILITOT 0.8  PROT 7.1  ALBUMIN 4.5    No results for input(s): LIPASE, AMYLASE in the last 168 hours. Recent Labs  Lab 08/16/20 1556  AMMONIA 20    Coagulation Profile: Recent Labs  Lab 08/17/20 1315 08/18/20 0010 08/19/20 0102 08/20/20 0049 08/21/20 0029  INR 1.6* 1.7* 2.1* 2.6* 2.4*    Cardiac Enzymes: No results for input(s): CKTOTAL, CKMB, CKMBINDEX, TROPONINI in the last 168 hours. BNP (last 3 results) No results for input(s): PROBNP in the last 8760 hours. HbA1C: No results for input(s): HGBA1C in the last 72 hours.  CBG: No results for input(s): GLUCAP in the last 168 hours. Lipid Profile:  No results for input(s): CHOL, HDL, LDLCALC, TRIG, CHOLHDL, LDLDIRECT in the last 72 hours.  Thyroid Function Tests: No results for input(s): TSH, T4TOTAL, FREET4, T3FREE, THYROIDAB in the last 72 hours.  Anemia Panel: No results for input(s): VITAMINB12, FOLATE, FERRITIN, TIBC, IRON, RETICCTPCT in the last 72 hours.  Urine analysis:    Component Value Date/Time   COLORURINE YELLOW 08/16/2020 1814   APPEARANCEUR CLEAR 08/16/2020 1814   LABSPEC 1.011 08/16/2020 1814   PHURINE 7.0 08/16/2020 1814   GLUCOSEU NEGATIVE 08/16/2020 1814   HGBUR NEGATIVE 08/16/2020 1814   BILIRUBINUR NEGATIVE 08/16/2020 1814   KETONESUR TRACE (A) 08/16/2020 1814   PROTEINUR NEGATIVE 08/16/2020 1814   NITRITE NEGATIVE 08/16/2020 1814   LEUKOCYTESUR TRACE (A) 08/16/2020 1814   Sepsis Labs: @LABRCNTIP (procalcitonin:4,lacticidven:4)  ) Recent Results (from the past 240 hour(s))  Resp Panel by RT-PCR (Flu A&B, Covid) Nasopharyngeal Swab     Status: None   Collection Time: 08/17/20  4:08 AM   Specimen: Nasopharyngeal Swab; Nasopharyngeal(NP) swabs in vial transport medium  Result Value  Ref Range Status   SARS Coronavirus 2 by RT PCR NEGATIVE NEGATIVE Final    Comment: (NOTE) SARS-CoV-2 target nucleic acids are NOT DETECTED.  The SARS-CoV-2 RNA is generally detectable in upper respiratory specimens during the acute phase of infection. The lowest concentration of SARS-CoV-2 viral copies this assay can detect is 138 copies/mL. A negative result does not preclude SARS-Cov-2 infection and should not be used as the sole basis for treatment or other patient management decisions. A negative result may occur with  improper specimen collection/handling, submission of specimen other than nasopharyngeal swab, presence of viral mutation(s) within the areas targeted by this assay, and inadequate number of viral copies(<138 copies/mL). A negative result must be combined with clinical observations, patient history, and epidemiological information. The expected result is Negative.  Fact Sheet for Patients:  EntrepreneurPulse.com.au  Fact Sheet for Healthcare Providers:  IncredibleEmployment.be  This test is no t yet approved or cleared by the [place] FDA and  has been authorized for detection and/or diagnosis of SARS-CoV-2 by FDA under an Emergency Use Authorization (EUA). This EUA will remain  in effect (meaning this test can be used) for the duration of the COVID-19 declaration under Section 564(b)(1) of the Act, 21 U.S.C.section 360bbb-3(b)(1), unless the authorization is terminated  or revoked sooner.       Influenza A by PCR NEGATIVE NEGATIVE Final   Influenza B by PCR NEGATIVE NEGATIVE Final    Comment: (NOTE) The Xpert Xpress SARS-CoV-2/FLU/RSV plus assay is intended as an aid in the diagnosis of influenza from Nasopharyngeal swab specimens and should not be used as a sole basis for treatment. Nasal washings and aspirates are unacceptable for Xpert Xpress SARS-CoV-2/FLU/RSV testing.  Fact Sheet for  Patients: EntrepreneurPulse.com.au  Fact Sheet for Healthcare Providers: IncredibleEmployment.be  This test is not yet approved or cleared by the [place] FDA and has been authorized for detection and/or diagnosis of SARS-CoV-2 by FDA under an Emergency Use Authorization (EUA). This EUA will remain in effect (meaning this test can be used) for the duration of the COVID-19 declaration under Section 564(b)(1) of the Act, 21 U.S.C. section 360bbb-3(b)(1), unless the authorization is terminated or revoked.  Performed at [facility], [address], [place], [place] [address]       Radiology Studies: No results found.   Scheduled Meds:  doxycycline  100 mg Oral Q12H   feeding supplement  237 mL Oral TID BM  fluconazole  150 mg Oral Daily   levothyroxine  112 mcg Oral Q0600   melatonin  20 mg Oral QHS   metoprolol succinate  50 mg Oral Daily   multivitamin with minerals  1 tablet Oral Daily   pantoprazole (PROTONIX) IV  40 mg Intravenous Q12H   pravastatin  20 mg Oral QHS   pregabalin  150 mg Oral TID   QUEtiapine  200 mg Oral QHS   senna-docusate  2 tablet Oral BID   [START ON 08/26/2020] thiamine injection  100 mg Intravenous Daily   verapamil  120 mg Oral QHS   vitamin B-12  1,000 mcg Oral Daily   warfarin  3 mg Oral ONCE-1600   Warfarin - Pharmacist Dosing Inpatient   Does not apply q1600   Continuous Infusions:  promethazine (PHENERGAN) injection (IM or IVPB)     thiamine injection 250 mg (08/21/20 1018)     LOS: 3 days   Time Spent in minutes   30 minutes  Brieanna Nau D.O. on 08/21/2020 at 1:32 PM  Between 7am to 7pm - Please see pager noted on [email or web address]  After 7pm go to [email or web address]  And look for the night coverage person covering for me after hours  [facility] Office  [phone]

## 2020-08-21 NOTE — Progress Notes (Signed)
ANTICOAGULATION CONSULT NOTE - Consult  Pharmacy Consult for warfarin  Indication: atrial fibrillation   Patient Measurements: Height: 4\' 11"  (149.9 cm) IBW/kg (Calculated) : 43.2   Vital Signs: Temp: 98.9 F (37.2 C) (06/26 0746) Temp Source: Oral (06/26 0746) BP: 121/59 (06/26 0746) Pulse Rate: 60 (06/26 0746)   Labs: Recent Labs    08/19/20 0102 08/20/20 0049 08/21/20 0029  HGB 11.6* 11.9*  --   HCT 35.8* 35.2*  --   PLT 182 153  --   LABPROT 23.5* 28.0* 26.4*  INR 2.1* 2.6* 2.4*    CrCl cannot be calculated (Unknown ideal weight.).   Medical History: Past Medical History:  Diagnosis Date   Anxiety    Atrial fibrillation (HCC)    Bipolar 2 disorder ([place])    Depression    Hematoma 07/06/2020   History of cardioversion    x2   Hyperlipidemia    Hypothyroidism    Migraine headache    Osteoporosis    Septic arthritis of interphalangeal joint of toe of right foot ([place]) 07/06/2020    Medications:  Scheduled:   doxycycline  100 mg Oral Q12H   feeding supplement  237 mL Oral TID BM   fluconazole  150 mg Oral Daily   levothyroxine  112 mcg Oral Q0600   melatonin  20 mg Oral QHS   metoprolol succinate  50 mg Oral Daily   multivitamin with minerals  1 tablet Oral Daily   pantoprazole (PROTONIX) IV  40 mg Intravenous Q12H   pravastatin  20 mg Oral QHS   pregabalin  150 mg Oral TID   QUEtiapine  200 mg Oral QHS   senna-docusate  2 tablet Oral BID   [START ON 08/26/2020] thiamine injection  100 mg Intravenous Daily   verapamil  120 mg Oral QHS   vitamin B-12  1,000 mcg Oral Daily   Warfarin - Pharmacist Dosing Inpatient   Does not apply q1600    Assessment: 70 year old female presented to [facility] 08/16/20 with AMS. Patient takes warfarin PTA for history of atrial fibrillation. PTA med history difficult to obtain from patient and family. Last dose of warfarin unknown. However, patient's last warfarin clinic appointment on 08/04/20 shows regimen of warfarin 3.75 mg  daily.   INR remains therapeutic today at 2.4. Hgb stable with not bleeding reported.   Goal of Therapy:  INR 2-3 Monitor platelets by anticoagulation protocol: Yes   Plan:  - Give warfarin 3 mg po x 1 - Monitor daily INR, CBC, clinical course, s/sx of bleed, PO intake/diet, Drug-Drug Interactions     Caral Whan L. Devin Going, PharmD, [place] PGY2 Pharmacy Resident [phone] 08/21/20     9:12 AM  Please check AMION for all [place] phone numbers After 10:00 PM, call the [place] [phone]

## 2020-08-22 LAB — BASIC METABOLIC PANEL
Anion gap: 8 (ref 5–15)
BUN: 19 mg/dL (ref 8–23)
CO2: 25 mmol/L (ref 22–32)
Calcium: 8.6 mg/dL — ABNORMAL LOW (ref 8.9–10.3)
Chloride: 103 mmol/L (ref 98–111)
Creatinine, Ser: 1.03 mg/dL — ABNORMAL HIGH (ref 0.44–1.00)
GFR, Estimated: 58 mL/min — ABNORMAL LOW (ref >60)
Glucose, Bld: 113 mg/dL — ABNORMAL HIGH (ref 70–99)
Potassium: 2.6 mmol/L — CL (ref 3.5–5.1)
Sodium: 136 mmol/L (ref 135–145)

## 2020-08-22 LAB — DRUG SCREEN 10 W/CONF, SERUM
Amphetamines, IA: NEGATIVE ng/mL
Barbiturates, IA: NEGATIVE ug/mL
Benzodiazepines, IA: NEGATIVE ng/mL
Cocaine & Metabolite, IA: NEGATIVE ng/mL
Methadone, IA: NEGATIVE ng/mL
Opiates, IA: NEGATIVE ng/mL
Oxycodones, IA: NEGATIVE ng/mL
Phencyclidine, IA: NEGATIVE ng/mL
Propoxyphene, IA: NEGATIVE ng/mL
THC(Marijuana) Metabolite, IA: NEGATIVE ng/mL

## 2020-08-22 LAB — PROTIME-INR
INR: 2.3 — ABNORMAL HIGH (ref 0.8–1.2)
Prothrombin Time: 24.9 seconds — ABNORMAL HIGH (ref 11.4–15.2)

## 2020-08-22 LAB — MAGNESIUM: Magnesium: 2 mg/dL (ref 1.7–2.4)

## 2020-08-22 LAB — SARS CORONAVIRUS 2 (TAT 6-24 HRS): SARS Coronavirus 2: NEGATIVE

## 2020-08-22 LAB — PHOSPHORUS: Phosphorus: 3.3 mg/dL (ref 2.5–4.6)

## 2020-08-22 LAB — METHYLMALONIC ACID, SERUM: Methylmalonic Acid, Quantitative: 132 nmol/L (ref 0–378)

## 2020-08-22 MED ORDER — MAGNESIUM SULFATE 2 GM/50ML IV SOLN
INTRAVENOUS | Status: AC
  Filled 2020-08-22: qty 50

## 2020-08-22 MED ORDER — PREGABALIN 75 MG PO CAPS
150.0000 mg | ORAL_CAPSULE | Freq: Two times a day (BID) | ORAL | Status: DC
  Administered 2020-08-22 – 2020-08-23 (×2): 150 mg via ORAL
  Filled 2020-08-22 (×3): qty 2

## 2020-08-22 MED ORDER — POTASSIUM CHLORIDE CRYS ER 20 MEQ PO TBCR
40.0000 meq | EXTENDED_RELEASE_TABLET | ORAL | Status: AC
  Administered 2020-08-22 (×2): 40 meq via ORAL
  Filled 2020-08-22 (×2): qty 2

## 2020-08-22 MED ORDER — WARFARIN SODIUM 2.5 MG PO TABS
3.7500 mg | ORAL_TABLET | Freq: Every day | ORAL | Status: DC
  Administered 2020-08-22: 3.75 mg via ORAL
  Filled 2020-08-22: qty 1

## 2020-08-22 MED ORDER — PANTOPRAZOLE SODIUM 40 MG PO TBEC
40.0000 mg | DELAYED_RELEASE_TABLET | Freq: Two times a day (BID) | ORAL | Status: DC
  Administered 2020-08-22 – 2020-08-23 (×2): 40 mg via ORAL
  Filled 2020-08-22 (×2): qty 1

## 2020-08-22 MED ORDER — MAGNESIUM SULFATE 2 GM/50ML IV SOLN
2.0000 g | Freq: Once | INTRAVENOUS | Status: AC
  Administered 2020-08-22: 2 g via INTRAVENOUS
  Filled 2020-08-22: qty 50

## 2020-08-22 MED ORDER — SODIUM CHLORIDE 0.9 % IV SOLN: INTRAVENOUS | Status: DC

## 2020-08-22 MED ORDER — SODIUM CHLORIDE 0.9 % IV BOLUS
500.0000 mL | Freq: Once | INTRAVENOUS | Status: AC
  Administered 2020-08-22: 500 mL via INTRAVENOUS

## 2020-08-22 NOTE — Progress Notes (Signed)
TRH night shift telemetry coverage note.  The nursing staff reported that the patient's potassium was 2.6 mmol/L.  K-Lor 40 mEq every 4 hours x 2 doses and magnesium sulfate 2 g IVPB were ordered.  Magnesium and phosphorus were added to previous labs.  Tennis Must, MD.

## 2020-08-22 NOTE — Progress Notes (Signed)
I have sent Dr. Ree Kida a secure chat and paged her regarding room 504 and her current BP status and symptoms. Awaiting response. Will cont to monitor pt.

## 2020-08-22 NOTE — TOC Progression Note (Signed)
Transition of Care [facility]) - Progression Note    Patient Details  Name: Meredith [name] MRN: [id number] Date of Birth: 08-12-1950  Transition of [facility]) CM/SW [place], LCSW Phone Number: 08/22/2020, 9:43 AM  Clinical Narrative:    [facility] able to accept patient tomorrow if medically ready. Patient's brother will complete paperwork there at 10am tomorrow. Requested COVID test from MD.    Expected Discharge Plan: [place] Barriers to Discharge: Continued Medical Work up, SNF Pending bed offer  Expected Discharge Plan and Services Expected Discharge Plan: [place] In-house Referral: Clinical Social Work   Post Acute Care Choice: [place] Living arrangements for the past 2 months: Apartment                                       Social Determinants of Health (SDOH) Interventions    Readmission Risk Interventions Readmission Risk Prevention Plan 08/18/2020  Transportation Screening Complete  PCP or Specialist Appt within 3-5 Days Complete  HRI or [place] Complete  Social Work Consult for [place] Planning/Counseling Complete  Palliative Care Screening Not Applicable  Medication Review Press photographer) Complete  Some recent data might be hidden

## 2020-08-22 NOTE — Progress Notes (Signed)
ANTICOAGULATION CONSULT NOTE - Consult  Pharmacy Consult for warfarin  Indication: atrial fibrillation   Patient Measurements: Height: 4\' 11"  (149.9 cm) IBW/kg (Calculated) : 43.2   Vital Signs: Temp: 97.6 F (36.4 C) (06/27 0833) Temp Source: Oral (06/27 0833) BP: 98/51 (06/27 0849) Pulse Rate: 60 (06/27 0833)   Labs: Recent Labs    08/20/20 0049 08/21/20 0029 08/22/20 0034  HGB 11.9*  --   --   HCT 35.2*  --   --   PLT 153  --   --   LABPROT 28.0* 26.4* 24.9*  INR 2.6* 2.4* 2.3*  CREATININE  --   --  1.03*    CrCl cannot be calculated (Unknown ideal weight.).   Medical History: Past Medical History:  Diagnosis Date   Anxiety    Atrial fibrillation (HCC)    Bipolar 2 disorder ([place])    Depression    Hematoma 07/06/2020   History of cardioversion    x2   Hyperlipidemia    Hypothyroidism    Migraine headache    Osteoporosis    Septic arthritis of interphalangeal joint of toe of right foot ([place]) 07/06/2020    Medications:  Scheduled:   doxycycline  100 mg Oral Q12H   feeding supplement  237 mL Oral TID BM   levothyroxine  112 mcg Oral Q0600   melatonin  20 mg Oral QHS   metoprolol succinate  50 mg Oral Daily   multivitamin with minerals  1 tablet Oral Daily   pantoprazole (PROTONIX) IV  40 mg Intravenous Q12H   potassium chloride  40 mEq Oral Q4H   pravastatin  20 mg Oral QHS   pregabalin  150 mg Oral TID   QUEtiapine  200 mg Oral QHS   senna-docusate  2 tablet Oral BID   [START ON 08/26/2020] thiamine injection  100 mg Intravenous Daily   verapamil  120 mg Oral QHS   vitamin B-12  1,000 mcg Oral Daily   Warfarin - Pharmacist Dosing Inpatient   Does not apply q1600    Assessment: 70 year old female presented to [facility] 08/16/20 with AMS. Patient takes warfarin PTA for history of atrial fibrillation. PTA med history difficult to obtain from patient and family. Last dose of warfarin unknown. However, patient's last warfarin clinic appointment on 08/04/20  shows regimen of warfarin 3.75 mg daily.   INR therapeutic at 2.3. She did get 1 dose of fluconazole yesterday. We will try to resume her home coumadin dose and daily INR for now.  Goal of Therapy:  INR 2-3 Monitor platelets by anticoagulation protocol: Yes   Plan:  - Resume coumadin 3.75mg  PO qday - Monitor daily INR, CBC, clinical course, s/sx of bleed, PO intake/diet, Drug-Drug Interactions  Onnie Boer, PharmD, [place], AAHIVP, CPP Infectious Disease Pharmacist 08/22/2020 8:52 AM

## 2020-08-22 NOTE — Progress Notes (Addendum)
PROGRESS NOTE    Kortney Potvin  SEG:[id number] DOB: [birth year] DOA: 08/16/2020 PCP: Kristen Loader, FNP   Brief Narrative:  HPI on 08/17/2020 by Dr. Lowell Guitar Idabelle Mcpeters is a 70 y.o. female with medical history significant of afib on Coumadin; bipolar; HLD; hypothyroidism; and septic arthritis of the R toe presenting with AMS.  Her brother reports that can't put her thoughts together and communicate and her R leg is shaking.  Her brother was away over the weekend and texted and called when he came back without reply.  Her pastor recommended getting her evaluated.  She is alert and able to talk but can't really put thoughts together.  She was not having difficulty caring for herself.  He got a text from her Saturday for Father's Day but he hadn't actually spoken to her in almost 2 weeks.  No one had spoken to her in the interim.  She also has abdominal pain, fairly new, just TTP.  He is uncertain if her bipolar is usually controlled.   RN called to report an episode of emesis that was "dark red."  Patient otherwise hemodynamically stable.   Interim history Admitted with altered mental status.  CT and MRI unremarkable.  EEG obtained showing no seizure activity.  Neurology feels this may be psychiatric in nature.  Lithium level subtherapeutic.  Psychiatry consulted. Hypotensive today.  Assessment & Plan   Acute metabolic encephalopathy -Patient presented with expressive aphasia.   -Today, mental status as well as expressive aphasia appear to be improving -CT head unremarkable -MRI brain also unremarkable for acute findings -EEG showed moderate diffuse encephalopathy, nonspecific etiology.  No seizures or definite elliptic form discharges seen during recording. -UA and chest x-ray unremarkable for infection -Patient does have history of septic arthritis of the right great toe for which she is currently taking doxycycline -Neurology consulted and appreciated-given that there  is no organic cause for altered mental status, feels this may be more psychiatric in nature -Psychiatry consulted and appreciated- recommended not restarting lithium, Abilify or Lamictal.  Continue Seroquel alone.  Feels this may be polypharmacy.   -Lithium level subtherapeutic, 0.17  Symptomatic hypotension -patient noted have SBP in the 70s this morning and feeling lightheaded -Fluid bolus ordered, followed by NS 75cc/hr -will place holding parameters on metoprolol and verapamil   Emesis -Noted by nursing on admission, however no further evidence or reports of emesis -Patient stated she had nausea last night followed by multiple episodes of vomiting -Currently she denies abdominal pain further nausea or vomiting -Continue Protonix -Continue antiemetics as needed  Atrial fibrillation -Continue metoprolol -Continue Coumadin, per pharmacy-currently INR 2.4  Vitamin B12 deficiency -B12 167, continue supplementation  Bipolar disorder -Abilify and lithium as well as Lamictal currently held -Continue Seroquel and Lyrica -Psychiatry consulted and appreciated- see discuss above  Hyperlipidemia -Continue statin  Hypothyroidism -Continue Synthroid  Chronic pain -Continue Lyrica and hydrocodone  Vaginal irritation -given one dose diflucan   Right great toe septic arthritis -Appears stable, continue doxycycline  Obesity -BMI of 36.17 -Patient needs to follow-up with her PCP for lifestyle modifications  Physical deconditioning -PT recommended SNF -TOC made aware  Hypokalemia -replace and continue to monitor   DVT Prophylaxis Coumadin  Code Status: DNR  Family Communication: None at bedside  Disposition Plan:  Status is: Inpatient  Remains inpatient appropriate because:Altered mental status and Unsafe d/c plan  Dispo: The patient is from: Home  Anticipated d/c is to: SNF              Patient currently is medically stable to d/c.   Difficult to place  patient No  Consultants Neurology  Procedures  EEG Echocardiogram  Antibiotics   Anti-infectives (From admission, onward)    Start     Dose/Rate Route Frequency Ordered Stop   08/21/20 1000  fluconazole (DIFLUCAN) tablet 150 mg  Status:  Discontinued        150 mg Oral Daily 08/21/20 0901 08/22/20 0845   08/17/20 1330  doxycycline (VIBRA-TABS) tablet 100 mg        100 mg Oral Every 12 hours 08/17/20 1240         Subjective:   Inda Castle seen and examined today.  Complains of feeling lightheaded and dizziness. Denies current chest pain, shortness of breath, abdominal pain. Had nausea yesterday evening.  Objective:   Vitals:   08/22/20 0335 08/22/20 0733 08/22/20 0833 08/22/20 0849  BP: (!) 121/59 (!) 78/43 (!) 96/44 (!) 98/51  Pulse: 60 (!) 56 60   Resp: 18 19 20    Temp: 98.3 F (36.8 C) 97.6 F (36.4 C) 97.6 F (36.4 C)   TempSrc:  Oral Oral   SpO2: 98% 96% 98%   Height:        Intake/Output Summary (Last 24 hours) at 08/22/2020 0859 Last data filed at 08/22/2020 0524 Gross per 24 hour  Intake 288.87 ml  Output 300 ml  Net -11.13 ml    There were no vitals filed for this visit.  Exam General: Well developed, chronically ill-appearing, NAD HEENT: NCAT, mucous membranes moist.  Cardiovascular: S1 S2 auscultated, RRR Respiratory: Clear to auscultation bilaterally  Abdomen: Soft, nontender, nondistended, + bowel sounds Extremities: warm dry without cyanosis clubbing or edema. Neuro: AAOx3, nonfocal, aphasia resolved  Psych: Appropriate mood and affect, pleasant    Data Reviewed: I have personally reviewed following labs and imaging studies  CBC: Recent Labs  Lab 08/16/20 1451 08/17/20 1807 08/18/20 0010 08/19/20 0102 08/20/20 0049  WBC 9.9 11.7* 11.1* 8.8 10.7*  HGB 13.9 13.8 13.0 11.6* 11.9*  HCT 42.6 40.8 39.5 35.8* 35.2*  MCV 91.6 89.9 91.9 92.5 90.3  PLT 224 227 220 182 834    Basic Metabolic Panel: Recent Labs  Lab 08/16/20 1451  08/16/20 1556 08/18/20 0010 08/22/20 0034  NA 141  --  140 136  K 4.1  --  3.9 2.6*  CL 103  --  106 103  CO2 26  --  24 25  GLUCOSE 103*  --  120* 113*  BUN 11  --  26* 19  CREATININE 0.94  --  1.10* 1.03*  CALCIUM 10.1  --  9.2 8.6*  MG  --  2.0  --  2.0  PHOS  --  2.7  --  3.3    GFR: CrCl cannot be calculated (Unknown ideal weight.). Liver Function Tests: Recent Labs  Lab 08/16/20 1451  AST 14*  ALT 10  ALKPHOS 56  BILITOT 0.8  PROT 7.1  ALBUMIN 4.5    No results for input(s): LIPASE, AMYLASE in the last 168 hours. Recent Labs  Lab 08/16/20 1556  AMMONIA 20    Coagulation Profile: Recent Labs  Lab 08/18/20 0010 08/19/20 0102 08/20/20 0049 08/21/20 0029 08/22/20 0034  INR 1.7* 2.1* 2.6* 2.4* 2.3*    Cardiac Enzymes: No results for input(s): CKTOTAL, CKMB, CKMBINDEX, TROPONINI in the last 168 hours. BNP (last 3 results) No results for  input(s): PROBNP in the last 8760 hours. HbA1C: No results for input(s): HGBA1C in the last 72 hours.  CBG: No results for input(s): GLUCAP in the last 168 hours. Lipid Profile: No results for input(s): CHOL, HDL, LDLCALC, TRIG, CHOLHDL, LDLDIRECT in the last 72 hours.  Thyroid Function Tests: No results for input(s): TSH, T4TOTAL, FREET4, T3FREE, THYROIDAB in the last 72 hours.  Anemia Panel: No results for input(s): VITAMINB12, FOLATE, FERRITIN, TIBC, IRON, RETICCTPCT in the last 72 hours.  Urine analysis:    Component Value Date/Time   COLORURINE YELLOW 08/16/2020 1814   APPEARANCEUR CLEAR 08/16/2020 1814   LABSPEC 1.011 08/16/2020 1814   PHURINE 7.0 08/16/2020 1814   GLUCOSEU NEGATIVE 08/16/2020 1814   HGBUR NEGATIVE 08/16/2020 1814   BILIRUBINUR NEGATIVE 08/16/2020 1814   KETONESUR TRACE (A) 08/16/2020 1814   PROTEINUR NEGATIVE 08/16/2020 1814   NITRITE NEGATIVE 08/16/2020 1814   LEUKOCYTESUR TRACE (A) 08/16/2020 1814   Sepsis Labs: @LABRCNTIP (procalcitonin:4,lacticidven:4)  ) Recent Results  (from the past 240 hour(s))  Resp Panel by RT-PCR (Flu A&B, Covid) Nasopharyngeal Swab     Status: None   Collection Time: 08/17/20  4:08 AM   Specimen: Nasopharyngeal Swab; Nasopharyngeal(NP) swabs in vial transport medium  Result Value Ref Range Status   SARS Coronavirus 2 by RT PCR NEGATIVE NEGATIVE Final    Comment: (NOTE) SARS-CoV-2 target nucleic acids are NOT DETECTED.  The SARS-CoV-2 RNA is generally detectable in upper respiratory specimens during the acute phase of infection. The lowest concentration of SARS-CoV-2 viral copies this assay can detect is 138 copies/mL. A negative result does not preclude SARS-Cov-2 infection and should not be used as the sole basis for treatment or other patient management decisions. A negative result may occur with  improper specimen collection/handling, submission of specimen other than nasopharyngeal swab, presence of viral mutation(s) within the areas targeted by this assay, and inadequate number of viral copies(<138 copies/mL). A negative result must be combined with clinical observations, patient history, and epidemiological information. The expected result is Negative.  Fact Sheet for Patients:  EntrepreneurPulse.com.au  Fact Sheet for Healthcare Providers:  IncredibleEmployment.be  This test is no t yet approved or cleared by the [place] FDA and  has been authorized for detection and/or diagnosis of SARS-CoV-2 by FDA under an Emergency Use Authorization (EUA). This EUA will remain  in effect (meaning this test can be used) for the duration of the COVID-19 declaration under Section 564(b)(1) of the Act, 21 U.S.C.section 360bbb-3(b)(1), unless the authorization is terminated  or revoked sooner.       Influenza A by PCR NEGATIVE NEGATIVE Final   Influenza B by PCR NEGATIVE NEGATIVE Final    Comment: (NOTE) The Xpert Xpress SARS-CoV-2/FLU/RSV plus assay is intended as an aid in the  diagnosis of influenza from Nasopharyngeal swab specimens and should not be used as a sole basis for treatment. Nasal washings and aspirates are unacceptable for Xpert Xpress SARS-CoV-2/FLU/RSV testing.  Fact Sheet for Patients: EntrepreneurPulse.com.au  Fact Sheet for Healthcare Providers: IncredibleEmployment.be  This test is not yet approved or cleared by the [place] FDA and has been authorized for detection and/or diagnosis of SARS-CoV-2 by FDA under an Emergency Use Authorization (EUA). This EUA will remain in effect (meaning this test can be used) for the duration of the COVID-19 declaration under Section 564(b)(1) of the Act, 21 U.S.C. section 360bbb-3(b)(1), unless the authorization is terminated or revoked.  Performed at [facility], [address] [place], [place] [address]  Radiology Studies: No results found.   Scheduled Meds:  doxycycline  100 mg Oral Q12H   feeding supplement  237 mL Oral TID BM   levothyroxine  112 mcg Oral Q0600   melatonin  20 mg Oral QHS   metoprolol succinate  50 mg Oral Daily   multivitamin with minerals  1 tablet Oral Daily   pantoprazole  40 mg Oral BID   pravastatin  20 mg Oral QHS   pregabalin  150 mg Oral TID   QUEtiapine  200 mg Oral QHS   senna-docusate  2 tablet Oral BID   [START ON 08/26/2020] thiamine injection  100 mg Intravenous Daily   verapamil  120 mg Oral QHS   vitamin B-12  1,000 mcg Oral Daily   warfarin  3.75 mg Oral q1600   Warfarin - Pharmacist Dosing Inpatient   Does not apply q1600   Continuous Infusions:  sodium chloride     magnesium sulfate     promethazine (PHENERGAN) injection (IM or IVPB) 12.5 mg (08/21/20 1618)   thiamine injection 250 mg (08/21/20 1018)     LOS: 4 days   Time Spent in minutes   45 minutes  Izek Corvino D.O. on 08/22/2020 at 8:59 AM  Between 7am to 7pm - Please see pager noted on [email or web address]  After 7pm go to  [email or web address]  And look for the night coverage person covering for me after hours  [facility] Office  [phone]

## 2020-08-23 ENCOUNTER — Inpatient Hospital Stay: Admission: RE | Admit: 2020-08-23 | Payer: Medicare Other | Source: Ambulatory Visit

## 2020-08-23 DIAGNOSIS — Z741 Need for assistance with personal care: Secondary | ICD-10-CM | POA: Diagnosis not present

## 2020-08-23 DIAGNOSIS — R531 Weakness: Secondary | ICD-10-CM | POA: Diagnosis not present

## 2020-08-23 DIAGNOSIS — G9349 Other encephalopathy: Secondary | ICD-10-CM | POA: Diagnosis not present

## 2020-08-23 DIAGNOSIS — R2689 Other abnormalities of gait and mobility: Secondary | ICD-10-CM | POA: Diagnosis not present

## 2020-08-23 DIAGNOSIS — G43809 Other migraine, not intractable, without status migrainosus: Secondary | ICD-10-CM | POA: Diagnosis not present

## 2020-08-23 DIAGNOSIS — A0472 Enterocolitis due to Clostridium difficile, not specified as recurrent: Secondary | ICD-10-CM | POA: Diagnosis not present

## 2020-08-23 DIAGNOSIS — F4001 Agoraphobia with panic disorder: Secondary | ICD-10-CM | POA: Diagnosis not present

## 2020-08-23 DIAGNOSIS — Z7901 Long term (current) use of anticoagulants: Secondary | ICD-10-CM | POA: Diagnosis not present

## 2020-08-23 DIAGNOSIS — G894 Chronic pain syndrome: Secondary | ICD-10-CM | POA: Diagnosis not present

## 2020-08-23 DIAGNOSIS — Z6836 Body mass index (BMI) 36.0-36.9, adult: Secondary | ICD-10-CM | POA: Diagnosis not present

## 2020-08-23 DIAGNOSIS — R41841 Cognitive communication deficit: Secondary | ICD-10-CM | POA: Diagnosis not present

## 2020-08-23 DIAGNOSIS — E669 Obesity, unspecified: Secondary | ICD-10-CM | POA: Diagnosis not present

## 2020-08-23 DIAGNOSIS — I4891 Unspecified atrial fibrillation: Secondary | ICD-10-CM | POA: Diagnosis not present

## 2020-08-23 DIAGNOSIS — D508 Other iron deficiency anemias: Secondary | ICD-10-CM | POA: Diagnosis not present

## 2020-08-23 DIAGNOSIS — D649 Anemia, unspecified: Secondary | ICD-10-CM | POA: Diagnosis not present

## 2020-08-23 DIAGNOSIS — E7849 Other hyperlipidemia: Secondary | ICD-10-CM | POA: Diagnosis not present

## 2020-08-23 DIAGNOSIS — I10 Essential (primary) hypertension: Secondary | ICD-10-CM | POA: Diagnosis not present

## 2020-08-23 DIAGNOSIS — F319 Bipolar disorder, unspecified: Secondary | ICD-10-CM | POA: Diagnosis not present

## 2020-08-23 DIAGNOSIS — Z7401 Bed confinement status: Secondary | ICD-10-CM | POA: Diagnosis not present

## 2020-08-23 DIAGNOSIS — F3164 Bipolar disorder, current episode mixed, severe, with psychotic features: Secondary | ICD-10-CM | POA: Diagnosis not present

## 2020-08-23 DIAGNOSIS — F3132 Bipolar disorder, current episode depressed, moderate: Secondary | ICD-10-CM | POA: Diagnosis not present

## 2020-08-23 DIAGNOSIS — E039 Hypothyroidism, unspecified: Secondary | ICD-10-CM | POA: Diagnosis not present

## 2020-08-23 DIAGNOSIS — N289 Disorder of kidney and ureter, unspecified: Secondary | ICD-10-CM | POA: Diagnosis not present

## 2020-08-23 DIAGNOSIS — I480 Paroxysmal atrial fibrillation: Secondary | ICD-10-CM | POA: Diagnosis not present

## 2020-08-23 DIAGNOSIS — M009 Pyogenic arthritis, unspecified: Secondary | ICD-10-CM | POA: Diagnosis not present

## 2020-08-23 DIAGNOSIS — E538 Deficiency of other specified B group vitamins: Secondary | ICD-10-CM | POA: Diagnosis not present

## 2020-08-23 DIAGNOSIS — E119 Type 2 diabetes mellitus without complications: Secondary | ICD-10-CM | POA: Diagnosis not present

## 2020-08-23 DIAGNOSIS — G9340 Encephalopathy, unspecified: Secondary | ICD-10-CM | POA: Diagnosis not present

## 2020-08-23 DIAGNOSIS — F411 Generalized anxiety disorder: Secondary | ICD-10-CM | POA: Diagnosis not present

## 2020-08-23 DIAGNOSIS — K219 Gastro-esophageal reflux disease without esophagitis: Secondary | ICD-10-CM | POA: Diagnosis not present

## 2020-08-23 DIAGNOSIS — G3184 Mild cognitive impairment, so stated: Secondary | ICD-10-CM | POA: Diagnosis not present

## 2020-08-23 DIAGNOSIS — R262 Difficulty in walking, not elsewhere classified: Secondary | ICD-10-CM | POA: Diagnosis not present

## 2020-08-23 DIAGNOSIS — F3181 Bipolar II disorder: Secondary | ICD-10-CM | POA: Diagnosis not present

## 2020-08-23 DIAGNOSIS — R1111 Vomiting without nausea: Secondary | ICD-10-CM | POA: Diagnosis not present

## 2020-08-23 DIAGNOSIS — F4310 Post-traumatic stress disorder, unspecified: Secondary | ICD-10-CM | POA: Diagnosis not present

## 2020-08-23 DIAGNOSIS — F419 Anxiety disorder, unspecified: Secondary | ICD-10-CM | POA: Diagnosis not present

## 2020-08-23 DIAGNOSIS — M6281 Muscle weakness (generalized): Secondary | ICD-10-CM | POA: Diagnosis not present

## 2020-08-23 DIAGNOSIS — R278 Other lack of coordination: Secondary | ICD-10-CM | POA: Diagnosis not present

## 2020-08-23 DIAGNOSIS — F5105 Insomnia due to other mental disorder: Secondary | ICD-10-CM | POA: Diagnosis not present

## 2020-08-23 DIAGNOSIS — R4182 Altered mental status, unspecified: Secondary | ICD-10-CM | POA: Diagnosis not present

## 2020-08-23 DIAGNOSIS — G8929 Other chronic pain: Secondary | ICD-10-CM | POA: Diagnosis not present

## 2020-08-23 LAB — PROTIME-INR
INR: 2.8 — ABNORMAL HIGH (ref 0.8–1.2)
Prothrombin Time: 29.6 seconds — ABNORMAL HIGH (ref 11.4–15.2)

## 2020-08-23 LAB — BASIC METABOLIC PANEL
Anion gap: 7 (ref 5–15)
BUN: 21 mg/dL (ref 8–23)
CO2: 23 mmol/L (ref 22–32)
Calcium: 8.1 mg/dL — ABNORMAL LOW (ref 8.9–10.3)
Chloride: 108 mmol/L (ref 98–111)
Creatinine, Ser: 1.02 mg/dL — ABNORMAL HIGH (ref 0.44–1.00)
GFR, Estimated: 59 mL/min — ABNORMAL LOW (ref >60)
Glucose, Bld: 99 mg/dL (ref 70–99)
Potassium: 3.4 mmol/L — ABNORMAL LOW (ref 3.5–5.1)
Sodium: 138 mmol/L (ref 135–145)

## 2020-08-23 MED ORDER — ADULT MULTIVITAMIN W/MINERALS CH: 1.0000 | ORAL_TABLET | Freq: Every day | ORAL | Status: AC

## 2020-08-23 MED ORDER — METOPROLOL SUCCINATE ER 50 MG PO TB24
50.0000 mg | ORAL_TABLET | Freq: Every day | ORAL | 3 refills | Status: DC
Start: 2020-08-23 — End: 2021-03-24

## 2020-08-23 MED ORDER — CYANOCOBALAMIN 1000 MCG PO TABS: 1000.0000 ug | ORAL_TABLET | Freq: Every day | ORAL | Status: AC

## 2020-08-23 MED ORDER — WARFARIN SODIUM 2.5 MG PO TABS: 2.5000 mg | ORAL_TABLET | Freq: Once | ORAL | Status: DC

## 2020-08-23 MED ORDER — POTASSIUM CHLORIDE CRYS ER 20 MEQ PO TBCR
40.0000 meq | EXTENDED_RELEASE_TABLET | Freq: Once | ORAL | Status: AC
  Administered 2020-08-23: 40 meq via ORAL
  Filled 2020-08-23: qty 2

## 2020-08-23 MED ORDER — VERAPAMIL HCL ER 120 MG PO CP24: 120.0000 mg | ORAL_CAPSULE | Freq: Every day | ORAL | Status: DC

## 2020-08-23 MED ORDER — HYDROCODONE-ACETAMINOPHEN 7.5-325 MG PO TABS: 1.0000 | ORAL_TABLET | Freq: Three times a day (TID) | ORAL | 0 refills | Status: DC | PRN

## 2020-08-23 MED ORDER — ENSURE ENLIVE PO LIQD: 237.0000 mL | Freq: Three times a day (TID) | ORAL | Status: DC

## 2020-08-23 MED ORDER — PREGABALIN 150 MG PO CAPS: 150.0000 mg | ORAL_CAPSULE | Freq: Two times a day (BID) | ORAL | Status: DC

## 2020-08-23 NOTE — TOC Transition Note (Signed)
Transition of Care [facility]) - CM/SW Discharge Note   Patient Details  Name: Meredith [name] MRN: [id number] Date of Birth: [birth year]  Transition of Care [facility]) CM/SW Contact:  Benard Halsted, LCSW Phone Number: 08/23/2020, 11:51 AM   Clinical Narrative:    Patient will DC to: [facility] Anticipated DC date: 08/23/20 Family notified: Brother, [name] Transport by: Veleta Miners   Per MD patient ready for DC to [facility]. RN to call report prior to discharge [phone] Room 3238). RN, patient, patient's family, and facility notified of DC. Discharge Summary and FL2 sent to facility. DC packet on chart with signed script and DNR. Ambulance transport requested for patient.   CSW will sign off for now as social work intervention is no longer needed. Please consult [place] again if new needs arise.     Final next level of care: Skilled Nursing Facility Barriers to Discharge: Barriers Resolved   Patient Goals and CMS Choice Patient states their goals for this hospitalization and ongoing recovery are:: Rehab CMS Medicare.gov Compare Post Acute Care list provided to:: Patient Represenative (must comment) Choice offered to / list presented to : Sibling  Discharge Placement PASRR number recieved: 08/22/20            Patient [facility] bed at: [facility] Patient to be transferred to facility by: [place] Name of family member notified: Brother Patient and family notified of of transfer: 08/23/20  Discharge Plan and Services In-house Referral: Clinical Social Work   Post Acute Care Choice: [place]                               Social Determinants of Health (SDOH) Interventions     Readmission Risk Interventions Readmission Risk Prevention Plan 08/18/2020  Transportation Screening Complete  PCP or Specialist Appt within 3-5 Days Complete  HRI or [place] Complete  Social Work Consult for [place] Planning/Counseling Complete   Palliative Care Screening Not Applicable  Medication Review Press photographer) Complete  Some recent data might be hidden

## 2020-08-23 NOTE — Progress Notes (Signed)
ANTICOAGULATION CONSULT NOTE - Consult  Pharmacy Consult for warfarin  Indication: atrial fibrillation   Patient Measurements: Height: 4\' 11"  (149.9 cm) Weight: 81.2 kg (179 lb) IBW/kg (Calculated) : 43.2   Vital Signs: Temp: 98 F (36.7 C) (06/28 0743) Temp Source: Oral (06/28 0743) BP: 113/49 (06/28 0743) Pulse Rate: 70 (06/28 0743)   Labs: Recent Labs    08/21/20 0029 08/22/20 0034 08/23/20 0055  LABPROT 26.4* 24.9* 29.6*  INR 2.4* 2.3* 2.8*  CREATININE  --  1.03* 1.02*    Estimated Creatinine Clearance: 47.3 mL/min (A) (by C-G formula based on SCr of 1.02 mg/dL (H)).   Medical History: Past Medical History:  Diagnosis Date   Anxiety    Atrial fibrillation (HCC)    Bipolar 2 disorder ([place])    Depression    Hematoma 07/06/2020   History of cardioversion    x2   Hyperlipidemia    Hypothyroidism    Migraine headache    Osteoporosis    Septic arthritis of interphalangeal joint of toe of right foot ([place]) 07/06/2020    Medications:  Scheduled:   doxycycline  100 mg Oral Q12H   feeding supplement  237 mL Oral TID BM   levothyroxine  112 mcg Oral Q0600   melatonin  20 mg Oral QHS   metoprolol succinate  50 mg Oral Daily   multivitamin with minerals  1 tablet Oral Daily   pantoprazole  40 mg Oral BID   potassium chloride  40 mEq Oral Once   pravastatin  20 mg Oral QHS   pregabalin  150 mg Oral BID   QUEtiapine  200 mg Oral QHS   senna-docusate  2 tablet Oral BID   [START ON 08/26/2020] thiamine injection  100 mg Intravenous Daily   verapamil  120 mg Oral QHS   vitamin B-12  1,000 mcg Oral Daily   warfarin  3.75 mg Oral q1600   Warfarin - Pharmacist Dosing Inpatient   Does not apply q1600    Assessment: 70 year old female presented to [facility] 08/16/20 with AMS. Patient takes warfarin PTA for history of atrial fibrillation. PTA med history difficult to obtain from patient and family. Last dose of warfarin unknown. However, patient's last warfarin clinic  appointment on 08/04/20 shows regimen of warfarin 3.75 mg daily.   INR up to 2.8 today. She did get 1 dose of fluconazole. The increase could have caused by the fluconazole. We will reduce the dose today.   Goal of Therapy:  INR 2-3 Monitor platelets by anticoagulation protocol: Yes   Plan:  - Coumadin 2.5mg  PO x1 - Monitor daily INR, CBC, clinical course, s/sx of bleed, PO intake/diet, Drug-Drug Interactions  Onnie Boer, PharmD, [place], AAHIVP, CPP Infectious Disease Pharmacist 08/23/2020 8:40 AM

## 2020-08-23 NOTE — Progress Notes (Signed)
OT Cancellation Note  Patient Details Name: Meredith [name] MRN: [id number] DOB: 1950/08/31   Cancelled Treatment:    Reason Eval/Treat Not Completed: Other (comment) (Pt eating lunch while sitting in the recliner. WIll return as schedule allows. Thank you.)  [place], OTR/L Acute Rehab Pager: [phone] Office: [phone] 08/23/2020, 11:44 AM

## 2020-08-23 NOTE — Progress Notes (Signed)
PT Cancellation Note  Patient Details Name: Meredith [name] MRN: [id number] DOB: 29-Sep-1950   Cancelled Treatment:    Reason Eval/Treat Not Completed: Other (comment) per chart, leaving for SNF today. Holding per dept policy. Thank you for the opportunity to participate in her care!   Windell Norfolk, DPT, PN1   Supplemental Physical Therapist [facility]    Pager [phone] Acute Rehab Office [phone]

## 2020-08-23 NOTE — Discharge Summary (Signed)
Physician Discharge Summary  Meredith [name] AST:[id number] DOB: 1950/05/23 DOA: 08/16/2020  PCP: Kristen Loader, FNP  Admit date: 08/16/2020 Discharge date: 08/23/2020  Time spent: 45 minutes  Recommendations for Outpatient Follow-up:  Patient will be discharged to skilled nursing facility, continue physical and occupational therapy .  Patient will need to follow up with primary care provider within one week of discharge, repeat BMP.  Follow-up with outpatient psychiatry and infectious disease.  Patient should continue medications as prescribed.  Patient should follow a regular diet.   Discharge Diagnoses:  Acute metabolic encephalopathy Symptomatic hypotension Emesis Atrial fibrillation Vitamin B12 deficiency Bipolar disorder Hyperlipidemia Hypothyroidism Chronic pain Vaginal irritation Right great toe septic arthritis Obesity Physical deconditioning Hypokalemia  Discharge Condition: stable  Diet recommendation: regular  Filed Weights   08/22/20 1400  Weight: 81.2 kg    History of present illness:  on 08/17/2020 by Dr. Lowell Guitar Meredith [name] is a 70 y.o. female with medical history significant of afib on Coumadin; bipolar; HLD; hypothyroidism; and septic arthritis of the R toe presenting with AMS.  Her brother reports that can't put her thoughts together and communicate and her R leg is shaking.  Her brother was away over the weekend and texted and called when he came back without reply.  Her pastor recommended getting her evaluated.  She is alert and able to talk but can't really put thoughts together.  She was not having difficulty caring for herself.  He got a text from her Saturday for Father's Day but he hadn't actually spoken to her in almost 2 weeks.  No one had spoken to her in the interim.  She also has abdominal pain, fairly new, just TTP.  He is uncertain if her bipolar is usually controlled.   RN called to report an episode of emesis that was  "dark red."  Patient otherwise hemodynamically stable.  Hospital Course:  Acute metabolic encephalopathy -resolved, mental status back to baseline, patient alert and oriented x4, aphasia also resolved -Patient presented with expressive aphasia.   -CT head unremarkable -MRI brain also unremarkable for acute findings -EEG showed moderate diffuse encephalopathy, nonspecific etiology.  No seizures or definite elliptic form discharges seen during recording. -UA and chest x-ray unremarkable for infection -Patient does have history of septic arthritis of the right great toe for which she is currently taking doxycycline -Neurology consulted and appreciated-given that there is no organic cause for altered mental status, feels this may be more psychiatric in nature -Psychiatry consulted and appreciated- recommended not restarting lithium, Abilify or Lamictal.  Continue Seroquel alone.  Feels this may be polypharmacy.   -Lithium level subtherapeutic, 0.17   Symptomatic hypotension -Resolved -patient noted have SBP in the 70s this morning and feeling lightheaded -Was given fluid bolus, followed by NS 75cc/hr.  -IV fluids discontinued today, blood pressure appears to be stable -Continue holding parameters on verapamil and metoprolol   Emesis -Noted by nursing on admission, however no further evidence or reports of emesis -Patient stated she had nausea last night followed by multiple episodes of vomiting -Currently she denies abdominal pain further nausea or vomiting -Continue Protonix -Continue antiemetics as needed   Migraine headaches -Continue verapamil with holding parameters -continue aimovig and emgality  Atrial fibrillation -Continue metoprolol -Continue Coumadin, currently INR 2.8   Vitamin B12 deficiency -B12 167, continue supplementation   Bipolar disorder -Abilify and lithium as well as Lamictal currently held -Continue Seroquel and Lyrica -Psychiatry consulted and  appreciated- see discuss above  Hyperlipidemia -Continue statin   Hypothyroidism -Continue Synthroid   Chronic pain -Continue Lyrica and hydrocodone   Vaginal irritation -given one dose diflucan   Right great toe septic arthritis -Appears stable, continue doxycycline   Obesity -BMI of 36.17 -Patient needs to follow-up with her PCP for lifestyle modifications   Physical deconditioning -PT recommended SNF -TOC made aware   Hypokalemia -replace and continue to monitor   Consultants Neurology   Procedures EEG Echocardiogram    Discharge Exam: Vitals:   08/23/20 0347 08/23/20 0743  BP: (!) 94/52 (!) 113/49  Pulse: 65 70  Resp: 19 18  Temp: 98 F (36.7 C) 98 F (36.7 C)  SpO2: 94%     General: Well developed, chronically ill-appearing, NAD HEENT: NCAT, mucous membranes moist. Cardiovascular: S1 S2 auscultated, RRR Respiratory: Clear to auscultation bilaterally with equal chest rise Abdomen: Soft, nontender, nondistended, + bowel sounds Extremities: warm dry without cyanosis clubbing or edema Neuro: AAOx3, nonfocal Psych: pleasant, appropriate mood and affect  Discharge Instructions Discharge Instructions     Diet general   Complete by: As directed    Discharge instructions   Complete by: As directed    Patient will be discharged to skilled nursing facility, continue physical and occupational therapy.  Patient will need to follow up with primary care provider within one week of discharge, repeat BMP.  Follow-up with outpatient psychiatry and infectious disease.  Patient should continue medications as prescribed.  Patient should follow a regular diet.   Increase activity slowly   Complete by: As directed       Allergies as of 08/23/2020       Reactions   Codeine Anaphylaxis   Patient states she can take codeine but not percocet   Adhesive [tape]    blister   Celebrex [celecoxib] Hives   Erythromycin Other (See Comments)   Other    Other  reaction(s): MAKE HER CRAZY Other reaction(s): rash, high fever,welts Other reaction(s): rash Other reaction(s): severe diarrhea/vomiting Other reaction(s): Unknown   Penicillamine Diarrhea   Other reaction(s): Vomiting   Sulfa Antibiotics Hives   Tricyclic Antidepressants    Doesn't help   Amoxicillin Rash   Ampicillin Rash   Cephalexin Diarrhea   Macrodantin [nitrofurantoin] Rash   Penicillins Rash   Did it involve swelling of the face/tongue/throat, SOB, or low BP? Yes Did it involve sudden or severe rash/hives, skin peeling, or any reaction on the inside of your mouth or nose? NO Did you need to seek medical attention at a hospital or doctor's office? NO When did it last happen? childhood       If all above answers are "NO", may proceed with cephalosporin use.        Medication List     STOP taking these medications    ARIPiprazole 30 MG tablet Commonly known as: Abilify   lamoTRIgine 100 MG tablet Commonly known as: LAMICTAL   lithium carbonate 150 MG capsule   lithium carbonate 300 MG capsule       TAKE these medications    Aimovig 140 MG/ML Soaj Generic drug: Erenumab-aooe Inject 140 mg into the skin every 30 (thirty) days.   aspirin 325 MG tablet Take 325 mg by mouth daily as needed for headache.   baclofen 10 MG tablet Commonly known as: LIORESAL Take 1 tablet by mouth as needed.   clotrimazole 1 % cream Commonly known as: LOTRIMIN APPLY CREAM TOPICALLY TO AFFECTED AREA TWICE DAILY   cyanocobalamin 1000 MCG tablet Take 1 tablet (  1,000 mcg total) by mouth daily. Start taking on: August 24, 2020   docusate sodium 100 MG capsule Commonly known as: COLACE 1 capsule as needed   doxycycline 100 MG tablet Commonly known as: VIBRA-TABS Take 1 tablet (100 mg total) by mouth 2 (two) times daily.   Dulcolax 5 MG EC tablet Generic drug: bisacodyl PRN   Emgality 120 MG/ML Soaj Generic drug: Galcanezumab-gnlm Inject into the skin.   EPINEPHrine  0.3 mg/0.3 mL Soaj injection Commonly known as: EPI-PEN   estradiol 1 MG tablet Commonly known as: ESTRACE Take 1 mg by mouth daily.   feeding supplement Liqd Take 237 mLs by mouth 3 (three) times daily between meals.   ferrous sulfate 325 (65 FE) MG tablet Take 325 mg by mouth 2 (two) times daily.   Flonase Sensimist 27.5 MCG/SPRAY nasal spray Generic drug: fluticasone Place 1 spray into the nose daily.   HYDROcodone-acetaminophen 7.5-325 MG tablet Commonly known as: NORCO Take 1 tablet by mouth every 8 (eight) hours as needed for moderate pain.   ipratropium 0.03 % nasal spray Commonly known as: ATROVENT 1-2 sprays in each nostril   levocetirizine 5 MG tablet Commonly known as: XYZAL Take 5 mg by mouth every evening.   levothyroxine 112 MCG tablet Commonly known as: SYNTHROID Take 112 mcg by mouth daily before breakfast.   loperamide 2 MG capsule Commonly known as: IMODIUM Take 1 capsule (2 mg total) by mouth as needed for diarrhea or loose stools.   melatonin 5 MG Tabs Take 20 mg by mouth at bedtime.   metoprolol succinate 50 MG 24 hr tablet Commonly known as: TOPROL-XL Take 1 tablet (50 mg total) by mouth daily. Take with or immediately following a meal. Hold for low blood pressure or heart rate less than 60. What changed: additional instructions   multivitamin with minerals Tabs tablet Take 1 tablet by mouth daily. Start taking on: August 24, 2020   naloxone 0.4 MG/ML injection Commonly known as: NARCAN   ondansetron 4 MG tablet Commonly known as: Zofran Take 1 tablet (4 mg total) by mouth every 8 (eight) hours as needed for nausea or vomiting.   pantoprazole 40 MG tablet Commonly known as: PROTONIX Take 1 tablet (40 mg total) by mouth daily.   pravastatin 20 MG tablet Commonly known as: PRAVACHOL Take 20 mg by mouth daily.   pregabalin 150 MG capsule Commonly known as: LYRICA Take 1 capsule (150 mg total) by mouth 2 (two) times daily. What  changed: when to take this   QUEtiapine 200 MG tablet Commonly known as: SEROQUEL TAKE 1 TABLET BY MOUTH AT BEDTIME   senna-docusate 8.6-50 MG tablet Commonly known as: Peri-Colace Take 2 tablets by mouth 2 (two) times daily. For constipation   verapamil 120 MG 24 hr capsule Commonly known as: VERELAN PM Take 1 capsule (120 mg total) by mouth at bedtime. Hold for low blood pressures What changed: additional instructions   vitamin C 1000 MG tablet Take 1,000 mg by mouth daily.   Vitamin D3 125 MCG (5000 UT) Tabs Take 1 tablet by mouth daily at 12 noon.   warfarin 2.5 MG tablet Commonly known as: COUMADIN Take as directed. If you are unsure how to take this medication, talk to your nurse or doctor. Original instructions: Take 1.5 - 2 tablets once daily or AS DIRECTED BY COUMADIN CLINIC What changed: additional instructions       Allergies  Allergen Reactions   Codeine Anaphylaxis    Patient states she  can take codeine but not percocet    Adhesive [Tape]     blister   Celebrex [Celecoxib] Hives   Erythromycin Other (See Comments)   Other     Other reaction(s): MAKE HER CRAZY Other reaction(s): rash, high fever,welts Other reaction(s): rash Other reaction(s): severe diarrhea/vomiting Other reaction(s): Unknown   Penicillamine Diarrhea    Other reaction(s): Vomiting   Sulfa Antibiotics Hives   Tricyclic Antidepressants     Doesn't help   Amoxicillin Rash   Ampicillin Rash   Cephalexin Diarrhea   Macrodantin [Nitrofurantoin] Rash   Penicillins Rash    Did it involve swelling of the face/tongue/throat, SOB, or low BP? Yes Did it involve sudden or severe rash/hives, skin peeling, or any reaction on the inside of your mouth or nose? NO Did you need to seek medical attention at a hospital or doctor's office? NO When did it last happen? childhood       If all above answers are "NO", may proceed with cephalosporin use.    Contact information for follow-up providers      Kristen Loader, FNP. Schedule an appointment as soon as possible for a visit in 1 week(s).   Specialty: Family Medicine Why: Hospital follow-up Contact information: [place] [address] [phone]         [name], Wonda Cheng, MD .   Specialty: Cardiology Contact information: [place] [address] [phone]              Contact information for after-discharge care     Destination     [facility] Preferred SNF .   Service: Skilled Nursing Contact information: [place] [phone]                      The results of significant diagnostics from this hospitalization (including imaging, microbiology, ancillary and laboratory) are listed below for reference.    Significant Diagnostic Studies: CT Abdomen Pelvis Wo Contrast  Result Date: 08/16/2020 CLINICAL DATA:  Abdominal distension, pain, and constipation. Altered mental status. EXAM: CT ABDOMEN AND PELVIS WITHOUT CONTRAST TECHNIQUE: Multidetector CT imaging of the abdomen and pelvis was performed following the standard protocol without IV contrast. COMPARISON:  CT 11/24/2017 FINDINGS: Lower chest: Mild linear lingular atelectasis or scar. No pleural fluid. Normal heart size. Hepatobiliary: Mild motion artifact through the liver and gallbladder. Allowing for this, no evidence of focal hepatic abnormality. Tiny low-density lesions in the liver on prior contrast-enhanced exam are not seen. No gallbladder inflammation or calcified gallstone. No biliary dilatation, although the common bile duct is not well-defined on the current exam. Pancreas: No ductal dilatation or inflammation. Spleen: Normal in size without focal abnormality. Adrenals/Urinary Tract: Normal adrenal glands. Slight prominence of the renal collecting systems felt to be due to prominently distended urinary bladder. No renal calculi. Trace  chronic bilateral perinephric edema. Distended urinary bladder without wall thickening. No focal bladder abnormality. Stomach/Bowel: Small hiatal hernia. Physiologically distended stomach. No small bowel obstruction or inflammation. Normal appendix. Small volume of colonic stool, no increased stool burden. No colonic wall thickening. Occasional sigmoid diverticula without diverticulitis. Vascular/Lymphatic: Aortic atherosclerosis without aneurysm. No portal venous or mesenteric gas. No abdominopelvic adenopathy. Reproductive: Status post hysterectomy. No adnexal masses. Other: No free air, free fluid, or intra-abdominal fluid collection. Musculoskeletal: Surgical hardware in the thoracic spine is partially included. There are no acute or suspicious osseous abnormalities. IMPRESSION: 1. Distended urinary bladder.  Mild prominence of the renal collecting systems, likely secondary to bladder distention. No bladder wall thickening or urinary tract inflammation. Recommend correlation with urinalysis. 2. Minimal sigmoid colonic diverticulosis without diverticulitis. Aortic Atherosclerosis (ICD10-I70.0). Electronically Signed   By: Keith Rake M.D.   On: 08/16/2020 18:09   DG Chest 2 View  Result Date: 08/16/2020 CLINICAL DATA:  Altered mental status EXAM: CHEST - 2 VIEW COMPARISON:  07/15/2019, 10/02/2018 FINDINGS: The heart size and mediastinal contours are within normal limits. Both lungs are clear. Surgical hardware in the midthoracic region. IMPRESSION: No active cardiopulmonary disease. Electronically Signed   By: Donavan Foil M.D.   On: 08/16/2020 16:26   CT Head Wo Contrast  Result Date: 08/16/2020 CLINICAL DATA:  Mental status change EXAM: CT HEAD WITHOUT CONTRAST TECHNIQUE: Contiguous axial images were obtained from the base of the skull through the vertex without intravenous contrast. COMPARISON:  MRI 07/15/2019, CT brain 07/15/2019 FINDINGS: Brain: No acute territorial infarction, hemorrhage or  intracranial mass. Mild atrophy. Stable ventricle size and configuration. Vascular: No hyperdense vessels.  No unexpected calcification Skull: Normal. Negative for fracture or focal lesion. Sinuses/Orbits: No acute finding. Other: None IMPRESSION: No CT evidence for acute intracranial abnormality.  Mild atrophy Electronically Signed   By: Donavan Foil M.D.   On: 08/16/2020 16:19   MR ANGIO HEAD WO CONTRAST  Result Date: 08/17/2020 CLINICAL DATA:  Transient ischemic attack. EXAM: MRI HEAD WITHOUT CONTRAST MRA HEAD WITHOUT CONTRAST TECHNIQUE: Multiplanar, multi-echo pulse sequences of the brain and surrounding structures were acquired without intravenous contrast. Angiographic images of the Circle of Willis were acquired using MRA technique without intravenous contrast. COMPARISON: No pertinent prior exam. COMPARISON:  Same day CT head.  MRI Jul 15, 2019. FINDINGS: MRI HEAD FINDINGS Mildly motion limited study.  Within this limitation: Brain: No acute infarction, hemorrhage, hydrocephalus, extra-axial collection or mass lesion. Similar mild ventriculomegaly without rounding of the temporal horns. Vascular: See below. Skull and upper cervical spine: Normal marrow signal. Sinuses/Orbits: Clear sinuses.  Unremarkable orbits. Other: No mastoid effusions. MRA HEAD FINDINGS Anterior circulation: Bilateral intracranial ICAs, MCAs, and ACAs are patent without evidence of proximal hemodynamically significant stenosis. No aneurysm identified. Posterior circulation: The visualized intradural vertebral arteries, basilar artery, and posterior cerebral arteries are patent without evidence of hemodynamically significant proximal stenosis. Left fetal type PCA, anatomic variant. No aneurysm identified. Anatomic variants: See above. IMPRESSION: MRI: 1. No evidence of acute intracranial abnormality. Specifically, no acute infarct. 2. Similar appearance of the brain in comparison to 2021 prior MRI, including mild ventriculomegaly.  MRA: No large vessel occlusion or proximal hemodynamically significant stenosis. Electronically Signed   By: Margaretha Sheffield MD   On: 08/17/2020 16:33   MR BRAIN WO CONTRAST  Result Date: 08/17/2020 CLINICAL DATA:  Transient ischemic attack. EXAM: MRI HEAD WITHOUT CONTRAST MRA HEAD WITHOUT CONTRAST TECHNIQUE: Multiplanar, multi-echo pulse sequences of the brain and surrounding structures were acquired without intravenous contrast. Angiographic images of the Circle of Willis were acquired using MRA technique without intravenous contrast. COMPARISON: No pertinent prior exam. COMPARISON:  Same day CT head.  MRI Jul 15, 2019. FINDINGS: MRI HEAD FINDINGS Mildly motion limited study.  Within this limitation: Brain: No acute infarction, hemorrhage, hydrocephalus, extra-axial collection or mass lesion. Similar mild ventriculomegaly without rounding of the temporal horns. Vascular: See below. Skull and upper cervical spine: Normal marrow signal. Sinuses/Orbits: Clear sinuses.  Unremarkable orbits. Other: No mastoid effusions. MRA HEAD FINDINGS Anterior circulation: Bilateral intracranial ICAs, MCAs, and ACAs are patent without  evidence of proximal hemodynamically significant stenosis. No aneurysm identified. Posterior circulation: The visualized intradural vertebral arteries, basilar artery, and posterior cerebral arteries are patent without evidence of hemodynamically significant proximal stenosis. Left fetal type PCA, anatomic variant. No aneurysm identified. Anatomic variants: See above. IMPRESSION: MRI: 1. No evidence of acute intracranial abnormality. Specifically, no acute infarct. 2. Similar appearance of the brain in comparison to 2021 prior MRI, including mild ventriculomegaly. MRA: No large vessel occlusion or proximal hemodynamically significant stenosis. Electronically Signed   By: Margaretha Sheffield MD   On: 08/17/2020 16:33   EEG adult  Result Date: 08/17/2020 Lora Havens, MD     08/18/2020  9:56  AM Patient Name: Meredith [name] MRN: [id number] Epilepsy Attending: Lora Havens Referring Physician/Provider: Dr Karmen Bongo Date: 08/17/2020 Duration: 27.57 mins Patient history: 70 y.o. female who presented to the ED with impaired communication. EEG to evaluate for seizure Level of alertness: Awake AEDs during EEG study: LEV Technical aspects: This EEG study was done with scalp electrodes positioned according to the 10-20 International system of electrode placement. Electrical activity was acquired at a sampling rate of 500Hz  and reviewed with a high frequency filter of 70Hz  and a low frequency filter of 1Hz . EEG data were recorded continuously and digitally stored. Description: The posterior dominant rhythm consists of 6 Hz activity of moderate voltage (25-35 uV) seen predominantly in posterior head regions, symmetric and reactive to eye opening and eye closing. EEG showed continuous generalized 5 to 6 Hz theta as well as intermittent generalized 2-3hz  delta slowing, at times with triphasic morphology. Hyperventilation and photic stimulation were not performed.   ABNORMALITY - Continuous slow, generalized - Background slow IMPRESSION: This study is suggestive of moderate diffuse encephalopathy, nonspecific etiology. No seizures or definite epileptiform discharges were seen throughout the recording. Priyanka Barbra Sarks   Overnight EEG with video  Result Date: 08/18/2020 Lora Havens, MD     08/18/2020 10:08 AM Patient Name: Meredith [name] MRN: [id number] Epilepsy Attending: Lora Havens Referring Physician/Provider: Dr Donnetta Simpers Duration: 08/17/2020 1851 to 08/18/2020 0902  Patient history: 71 y.o. female who presented to the ED with impaired communication. EEG to evaluate for seizure  Level of alertness: Awake  AEDs during EEG study: LEV  Technical aspects: This EEG study was done with scalp electrodes positioned according to the 10-20 International system of electrode placement.  Electrical activity was acquired at a sampling rate of 500Hz  and reviewed with a high frequency filter of 70Hz  and a low frequency filter of 1Hz . EEG data were recorded continuously and digitally stored.  Description: The posterior dominant rhythm consists of 6 Hz activity of moderate voltage (25-35 uV) seen predominantly in posterior head regions, symmetric and reactive to eye opening and eye closing. EEG showed continuous generalized 5 to 6 Hz theta as well as intermittent generalized 2-3hz  delta slowing, at times with triphasic morphology. Hyperventilation and photic stimulation were not performed.    ABNORMALITY - Continuous slow, generalized - Background slow  IMPRESSION: This study is suggestive of moderate diffuse encephalopathy, nonspecific etiology. No seizures or definite epileptiform discharges were seen throughout the recording.  Lora Havens   ECHOCARDIOGRAM COMPLETE  Result Date: 08/18/2020    ECHOCARDIOGRAM REPORT   Patient Name:   Meredith [name] Date of Exam: 08/18/2020 Medical Rec #:  [id number]            Height:       59.0 in Accession #:    [id number]  Weight:       179.1 lb Date of Birth:  04-21-1950             BSA:          1.760 m Patient Age:    31 years             BP:           128/62 mmHg Patient Gender: F                    HR:           70 bpm. Exam Location:  Inpatient Procedure: 2D Echo Indications:    TIA  History:        Patient has prior history of Echocardiogram examinations, most                 recent 10/23/2018. Arrythmias:Atrial Fibrillation;                 Signs/Symptoms:Altered Mental Status.  Sonographer:    Johny Chess Referring Phys: [id number] JENNIFER YATES  Sonographer Comments: Suboptimal subcostal window. IMPRESSIONS  1. Left ventricular ejection fraction, by estimation, is 60 to 65%. The left ventricle has normal function. The left ventricle has no regional wall motion abnormalities. Left ventricular diastolic function could not be evaluated.  2.  Right ventricular systolic function is normal. The right ventricular size is normal. There is mildly elevated pulmonary artery systolic pressure.  3. Left atrial size was mildly dilated.  4. The mitral valve is normal in structure. Mild mitral valve regurgitation. No evidence of mitral stenosis.  5. The aortic valve is tricuspid. Aortic valve regurgitation is mild. No aortic stenosis is present. FINDINGS  Left Ventricle: Left ventricular ejection fraction, by estimation, is 60 to 65%. The left ventricle has normal function. The left ventricle has no regional wall motion abnormalities. The left ventricular internal cavity size was normal in size. There is  no left ventricular hypertrophy. Left ventricular diastolic function could not be evaluated due to atrial fibrillation. Left ventricular diastolic function could not be evaluated. Right Ventricle: The right ventricular size is normal.Right ventricular systolic function is normal. There is mildly elevated pulmonary artery systolic pressure. The tricuspid regurgitant velocity is 3.08 m/s, and with an assumed right atrial pressure of  3 mmHg, the estimated right ventricular systolic pressure is 27.7 mmHg. Left Atrium: Left atrial size was mildly dilated. Right Atrium: Right atrial size was normal in size. Pericardium: There is no evidence of pericardial effusion. Mitral Valve: The mitral valve is normal in structure. Mild mitral valve regurgitation. No evidence of mitral valve stenosis. Tricuspid Valve: The tricuspid valve is normal in structure. Tricuspid valve regurgitation is mild . No evidence of tricuspid stenosis. Aortic Valve: The aortic valve is tricuspid. Aortic valve regurgitation is mild. No aortic stenosis is present. Pulmonic Valve: The pulmonic valve was normal in structure. Pulmonic valve regurgitation is not visualized. No evidence of pulmonic stenosis. Aorta: The aortic root is normal in size and structure. Venous: The inferior vena cava was not well  visualized. IAS/Shunts: The interatrial septum was not well visualized.  LEFT VENTRICLE PLAX 2D LVIDd:         3.90 cm LVIDs:         2.50 cm LV PW:         0.90 cm LV IVS:        1.00 cm LVOT diam:     1.90 cm LV SV:  68 LV SV Index:   39 LVOT Area:     2.84 cm  RIGHT VENTRICLE RV S prime:     16.20 cm/s LEFT ATRIUM             Index       RIGHT ATRIUM           Index LA diam:        3.40 cm 1.93 cm/m  RA Area:     11.80 cm LA Vol (A2C):   59.2 ml 33.64 ml/m RA Volume:   25.20 ml  14.32 ml/m LA Vol (A4C):   59.8 ml 33.98 ml/m LA Biplane Vol: 60.5 ml 34.38 ml/m  AORTIC VALVE LVOT Vmax:   113.00 cm/s LVOT Vmean:  75.700 cm/s LVOT VTI:    0.241 m  AORTA Ao Root diam: 3.00 cm Ao Asc diam:  3.10 cm TRICUSPID VALVE TR Peak grad:   37.9 mmHg TR Vmax:        308.00 cm/s  SHUNTS Systemic VTI:  0.24 m Systemic Diam: 1.90 cm Kirk Ruths MD Electronically signed by Kirk Ruths MD Signature Date/Time: 08/18/2020/12:26:29 PM    Final     Microbiology: Recent Results (from the past 240 hour(s))  Resp Panel by RT-PCR (Flu A&B, Covid) Nasopharyngeal Swab     Status: None   Collection Time: 08/17/20  4:08 AM   Specimen: Nasopharyngeal Swab; Nasopharyngeal(NP) swabs in vial transport medium  Result Value Ref Range Status   SARS Coronavirus 2 by RT PCR NEGATIVE NEGATIVE Final    Comment: (NOTE) SARS-CoV-2 target nucleic acids are NOT DETECTED.  The SARS-CoV-2 RNA is generally detectable in upper respiratory specimens during the acute phase of infection. The lowest concentration of SARS-CoV-2 viral copies this assay can detect is 138 copies/mL. A negative result does not preclude SARS-Cov-2 infection and should not be used as the sole basis for treatment or other patient management decisions. A negative result may occur with  improper specimen collection/handling, submission of specimen other than nasopharyngeal swab, presence of viral mutation(s) within the areas targeted by this assay, and  inadequate number of viral copies(<138 copies/mL). A negative result must be combined with clinical observations, patient history, and epidemiological information. The expected result is Negative.  Fact Sheet for Patients:  EntrepreneurPulse.com.au  Fact Sheet for Healthcare Providers:  IncredibleEmployment.be  This test is no t yet approved or cleared by the [place] FDA and  has been authorized for detection and/or diagnosis of SARS-CoV-2 by FDA under an Emergency Use Authorization (EUA). This EUA will remain  in effect (meaning this test can be used) for the duration of the COVID-19 declaration under Section 564(b)(1) of the Act, 21 U.S.C.section 360bbb-3(b)(1), unless the authorization is terminated  or revoked sooner.       Influenza A by PCR NEGATIVE NEGATIVE Final   Influenza B by PCR NEGATIVE NEGATIVE Final    Comment: (NOTE) The Xpert Xpress SARS-CoV-2/FLU/RSV plus assay is intended as an aid in the diagnosis of influenza from Nasopharyngeal swab specimens and should not be used as a sole basis for treatment. Nasal washings and aspirates are unacceptable for Xpert Xpress SARS-CoV-2/FLU/RSV testing.  Fact Sheet for Patients: EntrepreneurPulse.com.au  Fact Sheet for Healthcare Providers: IncredibleEmployment.be  This test is not yet approved or cleared by the [place] FDA and has been authorized for detection and/or diagnosis of SARS-CoV-2 by FDA under an Emergency Use Authorization (EUA). This EUA will remain in effect (meaning this test can be used) for the  duration of the COVID-19 declaration under Section 564(b)(1) of the Act, 21 U.S.C. section 360bbb-3(b)(1), unless the authorization is terminated or revoked.  Performed at [facility], [place], [place] [address]   SARS CORONAVIRUS 2 (TAT 6-24 HRS) Nasopharyngeal Nasopharyngeal Swab     Status:  None   Collection Time: 08/22/20  9:11 AM   Specimen: Nasopharyngeal Swab  Result Value Ref Range Status   SARS Coronavirus 2 NEGATIVE NEGATIVE Final    Comment: (NOTE) SARS-CoV-2 target nucleic acids are NOT DETECTED.  The SARS-CoV-2 RNA is generally detectable in upper and lower respiratory specimens during the acute phase of infection. Negative results do not preclude SARS-CoV-2 infection, do not rule out co-infections with other pathogens, and should not be used as the sole basis for treatment or other patient management decisions. Negative results must be combined with clinical observations, patient history, and epidemiological information. The expected result is Negative.  Fact Sheet for Patients: SugarRoll.be  Fact Sheet for Healthcare Providers: https://www.woods-mathews.com/  This test is not yet approved or cleared by the [place] FDA and  has been authorized for detection and/or diagnosis of SARS-CoV-2 by FDA under an Emergency Use Authorization (EUA). This EUA will remain  in effect (meaning this test can be used) for the duration of the COVID-19 declaration under Se ction 564(b)(1) of the Act, 21 U.S.C. section 360bbb-3(b)(1), unless the authorization is terminated or revoked sooner.  Performed at [facility], [place] [address] [place], [place] [address]      Labs: Basic Metabolic Panel: Recent Labs  Lab 08/16/20 1451 08/16/20 1556 08/18/20 0010 08/22/20 0034 08/23/20 0055  NA 141  --  140 136 138  K 4.1  --  3.9 2.6* 3.4*  CL 103  --  106 103 108  CO2 26  --  24 25 23   GLUCOSE 103*  --  120* 113* 99  BUN 11  --  26* 19 21  CREATININE 0.94  --  1.10* 1.03* 1.02*  CALCIUM 10.1  --  9.2 8.6* 8.1*  MG  --  2.0  --  2.0  --   PHOS  --  2.7  --  3.3  --    Liver Function Tests: Recent Labs  Lab 08/16/20 1451  AST 14*  ALT 10  ALKPHOS 56  BILITOT 0.8  PROT 7.1  ALBUMIN 4.5   No results for  input(s): LIPASE, AMYLASE in the last 168 hours. Recent Labs  Lab 08/16/20 1556  AMMONIA 20   CBC: Recent Labs  Lab 08/16/20 1451 08/17/20 1807 08/18/20 0010 08/19/20 0102 08/20/20 0049  WBC 9.9 11.7* 11.1* 8.8 10.7*  HGB 13.9 13.8 13.0 11.6* 11.9*  HCT 42.6 40.8 39.5 35.8* 35.2*  MCV 91.6 89.9 91.9 92.5 90.3  PLT 224 227 220 182 153   Cardiac Enzymes: No results for input(s): CKTOTAL, CKMB, CKMBINDEX, TROPONINI in the last 168 hours. BNP: BNP (last 3 results) No results for input(s): BNP in the last 8760 hours.  ProBNP (last 3 results) No results for input(s): PROBNP in the last 8760 hours.  CBG: No results for input(s): GLUCAP in the last 168 hours.     Signed:  Cristal Ford  [facility] 08/23/2020, 9:42 AM

## 2020-08-23 NOTE — Care Management Important Message (Signed)
Important Message  Patient Details  Name: Meredith [name] MRN: [id number] Date of Birth: [birth year]   Medicare Important Message Given:  Yes - Important Message mailed due to current National Emergency   Verbal consent obtained due to current National Emergency  Relationship to patient: Self Contact Name: Robie Call Date: 08/23/20  Time: 1314 Phone: [phone] Outcome: No Answer/Busy Important Message mailed to: Patient address on file    Delorse Lek 08/23/2020, 1:15 PM

## 2020-08-24 DIAGNOSIS — G9340 Encephalopathy, unspecified: Secondary | ICD-10-CM | POA: Diagnosis not present

## 2020-08-24 DIAGNOSIS — I480 Paroxysmal atrial fibrillation: Secondary | ICD-10-CM | POA: Diagnosis not present

## 2020-08-24 DIAGNOSIS — F319 Bipolar disorder, unspecified: Secondary | ICD-10-CM | POA: Diagnosis not present

## 2020-08-24 DIAGNOSIS — E538 Deficiency of other specified B group vitamins: Secondary | ICD-10-CM | POA: Diagnosis not present

## 2020-08-25 DIAGNOSIS — D508 Other iron deficiency anemias: Secondary | ICD-10-CM | POA: Diagnosis not present

## 2020-08-25 DIAGNOSIS — G8929 Other chronic pain: Secondary | ICD-10-CM | POA: Diagnosis not present

## 2020-08-25 DIAGNOSIS — M6281 Muscle weakness (generalized): Secondary | ICD-10-CM | POA: Diagnosis not present

## 2020-08-25 DIAGNOSIS — E119 Type 2 diabetes mellitus without complications: Secondary | ICD-10-CM | POA: Diagnosis not present

## 2020-08-25 DIAGNOSIS — I10 Essential (primary) hypertension: Secondary | ICD-10-CM | POA: Diagnosis not present

## 2020-08-25 DIAGNOSIS — E039 Hypothyroidism, unspecified: Secondary | ICD-10-CM | POA: Diagnosis not present

## 2020-08-25 DIAGNOSIS — G9349 Other encephalopathy: Secondary | ICD-10-CM | POA: Diagnosis not present

## 2020-08-25 DIAGNOSIS — F419 Anxiety disorder, unspecified: Secondary | ICD-10-CM | POA: Diagnosis not present

## 2020-08-25 DIAGNOSIS — F319 Bipolar disorder, unspecified: Secondary | ICD-10-CM | POA: Diagnosis not present

## 2020-08-25 DIAGNOSIS — I480 Paroxysmal atrial fibrillation: Secondary | ICD-10-CM | POA: Diagnosis not present

## 2020-08-25 DIAGNOSIS — G43809 Other migraine, not intractable, without status migrainosus: Secondary | ICD-10-CM | POA: Diagnosis not present

## 2020-08-25 DIAGNOSIS — E7849 Other hyperlipidemia: Secondary | ICD-10-CM | POA: Diagnosis not present

## 2020-08-25 LAB — VITAMIN B1

## 2020-08-31 DIAGNOSIS — F319 Bipolar disorder, unspecified: Secondary | ICD-10-CM | POA: Diagnosis not present

## 2020-08-31 DIAGNOSIS — Z7901 Long term (current) use of anticoagulants: Secondary | ICD-10-CM | POA: Diagnosis not present

## 2020-08-31 DIAGNOSIS — I480 Paroxysmal atrial fibrillation: Secondary | ICD-10-CM | POA: Diagnosis not present

## 2020-08-31 DIAGNOSIS — G8929 Other chronic pain: Secondary | ICD-10-CM | POA: Diagnosis not present

## 2020-09-01 ENCOUNTER — Telehealth: Payer: Self-pay | Admitting: *Deleted

## 2020-09-01 ENCOUNTER — Encounter: Payer: Self-pay | Admitting: Psychiatry

## 2020-09-01 ENCOUNTER — Ambulatory Visit (INDEPENDENT_AMBULATORY_CARE_PROVIDER_SITE_OTHER): Payer: Medicare Other | Admitting: Psychiatry

## 2020-09-01 ENCOUNTER — Other Ambulatory Visit: Payer: Self-pay

## 2020-09-01 DIAGNOSIS — F411 Generalized anxiety disorder: Secondary | ICD-10-CM | POA: Diagnosis not present

## 2020-09-01 DIAGNOSIS — F3132 Bipolar disorder, current episode depressed, moderate: Secondary | ICD-10-CM | POA: Diagnosis not present

## 2020-09-01 DIAGNOSIS — F5105 Insomnia due to other mental disorder: Secondary | ICD-10-CM

## 2020-09-01 DIAGNOSIS — F3181 Bipolar II disorder: Secondary | ICD-10-CM | POA: Diagnosis not present

## 2020-09-01 DIAGNOSIS — G3184 Mild cognitive impairment, so stated: Secondary | ICD-10-CM | POA: Diagnosis not present

## 2020-09-01 DIAGNOSIS — G9340 Encephalopathy, unspecified: Secondary | ICD-10-CM

## 2020-09-01 DIAGNOSIS — F4001 Agoraphobia with panic disorder: Secondary | ICD-10-CM | POA: Diagnosis not present

## 2020-09-01 DIAGNOSIS — F4310 Post-traumatic stress disorder, unspecified: Secondary | ICD-10-CM | POA: Diagnosis not present

## 2020-09-01 MED ORDER — QUETIAPINE FUMARATE 200 MG PO TABS: 200.0000 mg | ORAL_TABLET | Freq: Every day | ORAL | 0 refills | Status: DC

## 2020-09-01 NOTE — Telephone Encounter (Signed)
Pt in the overdue list to have INR checked. Per chart pt was in the hospital from 6/21-6/28 and was going to be D/C to [facility].   Called pt and LMOM  for pt to call coumadin clinic back. Once pt is d/c from SNF pt will need appointment scheduled for her to have her INR checked.

## 2020-09-01 NOTE — Progress Notes (Signed)
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 medications to prevent that at this time given the uncertainty of the cause of the encephalopathy.  Overall more stressed than depressed over her health situation.  Using pillbox to help compliance.  Disc danger of mixing up or doubling up meds.  She fills box herself.  Rec she get help with this. Cannot afford branded meds which prevents [place] from using some of the bipolar depression meds like Latuda, Vraylar.  continue quetiapine to 200 mg at bedtime  She might have unrealistic expectations to sleep 9 hours.  Disc tolerance.  Push fluids.  Has to remind herself..  Disc ways to talk to brother about helping her.   Discussed potential metabolic side effects associated with atypical antipsychotics, as well as potential risk for movement side effects. Advised pt to contact office if movement side effects occur.   Hold lithium, Abilify, lamotrigine for now.   Continue Seroquel 200 mg HS. This has not been suffiecient to maintan mood  Using pill box to keep up with meds.  OK Ativan prn for  dental procedure 1-2 mg.  No driving after it.  PCP Elyn Peers, Genelle Bal  Continue therapy with Dr. Cheryln Manly q 2 weeks.  Bring meds to office. No med changes today  Follow-up 3 mos  Lynder Parents MD, DFAPA  Please see After Visit Summary for patient specific instructions.  Future Appointments  Date Time Provider [place]  09/02/2020  3:30 PM Tommy Medal, Lavell Islam, MD [facility]  09/06/2020  3:00 PM Oren Binet, PhD [facility] None  09/14/2020  1:00 PM Oren Binet, PhD [facility] None    No orders of the defined types were placed in this encounter.     -------------------------------

## 2020-09-02 ENCOUNTER — Ambulatory Visit (INDEPENDENT_AMBULATORY_CARE_PROVIDER_SITE_OTHER): Payer: Medicare Other | Admitting: Infectious Disease

## 2020-09-02 ENCOUNTER — Other Ambulatory Visit: Payer: Self-pay

## 2020-09-02 ENCOUNTER — Telehealth: Payer: Self-pay

## 2020-09-02 ENCOUNTER — Encounter: Payer: Self-pay | Admitting: Infectious Disease

## 2020-09-02 VITALS — BP 144/77 | HR 66 | Temp 98.3°F | Wt 173.0 lb

## 2020-09-02 DIAGNOSIS — R4182 Altered mental status, unspecified: Secondary | ICD-10-CM | POA: Diagnosis not present

## 2020-09-02 DIAGNOSIS — A0472 Enterocolitis due to Clostridium difficile, not specified as recurrent: Secondary | ICD-10-CM

## 2020-09-02 DIAGNOSIS — I480 Paroxysmal atrial fibrillation: Secondary | ICD-10-CM | POA: Diagnosis not present

## 2020-09-02 DIAGNOSIS — F3181 Bipolar II disorder: Secondary | ICD-10-CM

## 2020-09-02 DIAGNOSIS — M009 Pyogenic arthritis, unspecified: Secondary | ICD-10-CM | POA: Diagnosis not present

## 2020-09-02 DIAGNOSIS — G9340 Encephalopathy, unspecified: Secondary | ICD-10-CM | POA: Diagnosis not present

## 2020-09-02 DIAGNOSIS — F3164 Bipolar disorder, current episode mixed, severe, with psychotic features: Secondary | ICD-10-CM | POA: Diagnosis not present

## 2020-09-02 NOTE — Telephone Encounter (Signed)
Left voice message for nursing at [facility] to inform them of verbal orders per Dr.Van Dam - Patient is to stop Doxycyline and follow up at our office in 2 months on 11/11/2020 at 10:45 AM. Requested a call back to verify they received verbal orders.    [place], CMA

## 2020-09-02 NOTE — Progress Notes (Signed)
Subjective:  Chief complaint: Follow-up for osteomyelitis associate with hardware and toe  Patient ID: Meredith [name], female    DOB: [birth year], 70 y.o.   MRN: [id number]  HPI  Meredith [name] is a 70 year old Caucasian female that I saw in the clinic l in early May 2022   She has a history of diabetes mellitus mellitus atrial fibrillation on anticoagulation who had history of right forefoot pain and had podiatric surgery to the right hallux including MP joint replacement and IP joint resection arthroplasty.   She was evaluated by 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  Labs including sed rate and CRP were normal.  An MRI was not clear-cut as far as revealing pathology radiology thought this could be gout but also could potentially be osteomyelitis epic arthritis involving the IP joint.   She was taken to the operating room by [name].  In the operating room he found significant erosion of the distal portion of the proximal phalanx.  Bone specimens were taken for pathology and cultures were taken as well.   Hardware has been removed. Biopsy showed benign fibroelastic tissue.   Cultures did yield a Staph capitis was sensitive to essentially all antistaphylococcal antibiotics.   [name] called me about the patient and we decided to initiate doxycycline and to get her into our clinic.   I think that despite the fact that the patient's pathology result results were reassuring that she did not have a bone infection that her inflammatory markers are reassuring that we still nonetheless left with radiographic findings suggestive of osteomyelitis and a positive culture with him taking an in a sterile setting from the operating room in an area where infection was suspected.   I felt strongly that minimum we should do is give her 6 weeks of systemic IV antibiotics.   However since our visit she had  developed several concerns about the IV antibiotics.   1.   She is concerned about the cost and was told that it would be $600 out-of-pocket for the 6 weeks of treatment.   She then mentions something about being charged for a year and that it would be less but I really did not understand what she was saying.   2.  She is concerned about the PICC line getting infected which is certainly a legitimate concern.   3.  We talked about blood clots though she is on anticoagulation with Coumadin.   4.  She is concerned about being able to administer IV antibiotics to herself without help which I think she could do and most of our patients are capable of doing.     #5 she is concerned about being able to take a shower and being able to wrap the PICC line sufficiently to take sleep safely take a shower.     The end of our conversation we ultimately decided to simply stick with oral doxycycline.  She is continued on this and seen [name] for follow-up.  She continued to do clinically well.  Of note she never had pain as a presenting symptom of her infection.  Instead she had change in the alignment of her toes and the podiatrist then ordered an MRI.  She is now through 8 weeks if not more of doxycycline her inflammatory markers were normal last time I checked them.  She was admitted to [facility] with apparently a psychotic break it seems.  She attributes it to something  being given wrong as far as her medications including her lithium.  She continues to have pain at the site she has pain of the ball of her foot but attributes this to walking more than usual at to try to rehabilitate.  She says there is other surgeries that need to be done in other toes by [name].      Past Medical History:  Diagnosis Date   Anxiety    Atrial fibrillation ([place])    Bipolar 2 disorder ([place])    Depression    Hematoma 07/06/2020   History of cardioversion    x2   Hyperlipidemia    Hypothyroidism    Migraine headache    Osteoporosis    Septic arthritis of  interphalangeal joint of toe of right foot ([place]) 07/06/2020    Past Surgical History:  Procedure Laterality Date   ABLATION     BACK SURGERY  2014   CARDIOVERSION  12/2013, 01/2014   x2   KNEE SURGERY     ORTHOSCOPIC   LUNG REMOVAL, PARTIAL Right    BENIGN LUNG CYST   SYNOVIAL BIOPSY Right 06/16/2020   Procedure: Right hallux interphalangeal joint arthrotomy and biopsy;  Surgeon: [name], MD;  Location: [place];  Service: Orthopedics;  Laterality: Right;   THORACIC SPINE SURGERY Right    THUMB ARTHROSCOPY      Family History  Problem Relation Age of Onset   Arthritis Mother    Depression Mother    Diabetes Mother    Heart disease Mother    Stroke Mother    Early death Father    Heart disease Father    Atrial fibrillation Brother    Hyperlipidemia Brother    Multiple sclerosis Sister    Alcohol abuse Brother    Asthma Brother    Drug abuse Brother    Hypertension Brother    Mental illness Brother       Social History   Socioeconomic History   Marital status: Single    Spouse name: Not on file   Number of children: 3   Years of education: Not on file   Highest education level: Not on file  Occupational History   Occupation: Retired  Tobacco Use   Smoking status: Never   Smokeless tobacco: Never  Scientific laboratory technician Use: Not on file  Substance and Sexual Activity   Alcohol use: Not Currently   Drug use: No   Sexual activity: Not Currently  Other Topics Concern   Not on file  Social History Narrative   Not on file   Social Determinants of Health   Financial Resource Strain: Not on file  Food Insecurity: Not on file  Transportation Needs: Not on file  Physical Activity: Not on file  Stress: Not on file  Social Connections: Not on file    Allergies  Allergen Reactions   Codeine Anaphylaxis    Patient states she can take codeine but not percocet    Adhesive [Tape]     blister   Celebrex [Celecoxib] Hives   Erythromycin  Other (See Comments)   Other     Other reaction(s): MAKE HER CRAZY Other reaction(s): rash, high fever,welts Other reaction(s): rash Other reaction(s): severe diarrhea/vomiting Other reaction(s): Unknown   Penicillamine Diarrhea    Other reaction(s): Vomiting   Sulfa Antibiotics Hives   Tricyclic Antidepressants     Doesn't help   Amoxicillin Rash   Ampicillin Rash   Cephalexin Diarrhea   Macrodantin [Nitrofurantoin]  Rash   Penicillins Rash    Did it involve swelling of the face/tongue/throat, SOB, or low BP? Yes Did it involve sudden or severe rash/hives, skin peeling, or any reaction on the inside of your mouth or nose? NO Did you need to seek medical attention at a hospital or doctor's office? NO When did it last happen? childhood       If all above answers are "NO", may proceed with cephalosporin use.     Current Outpatient Medications:    Ascorbic Acid (VITAMIN C) 1000 MG tablet, Take 1,000 mg by mouth daily., Disp: , Rfl:    aspirin 325 MG tablet, Take 325 mg by mouth daily as needed for headache. , Disp: , Rfl:    baclofen (LIORESAL) 10 MG tablet, Take 1 tablet by mouth as needed., Disp: , Rfl:    bisacodyl (DULCOLAX) 5 MG EC tablet, PRN, Disp: , Rfl:    Cholecalciferol (VITAMIN D3) 125 MCG (5000 UT) TABS, Take 1 tablet by mouth daily at 12 noon., Disp: , Rfl:    clotrimazole (LOTRIMIN) 1 % cream, APPLY CREAM TOPICALLY TO AFFECTED AREA TWICE DAILY, Disp: , Rfl:    docusate sodium (COLACE) 100 MG capsule, 1 capsule as needed, Disp: , Rfl:    doxycycline (VIBRA-TABS) 100 MG tablet, Take 1 tablet (100 mg total) by mouth 2 (two) times daily., Disp: 60 tablet, Rfl: 5   EPINEPHrine 0.3 mg/0.3 mL IJ SOAJ injection, , Disp: , Rfl:    estradiol (ESTRACE) 1 MG tablet, Take 1 mg by mouth daily., Disp: , Rfl:    ferrous sulfate 325 (65 FE) MG tablet, Take 325 mg by mouth 2 (two) times daily., Disp: , Rfl:    fluticasone (FLONASE SENSIMIST) 27.5 MCG/SPRAY nasal spray, Place 1 spray  into the nose daily., Disp: , Rfl:    Galcanezumab-gnlm (EMGALITY) 120 MG/ML SOAJ, Inject into the skin., Disp: , Rfl:    HYDROcodone-acetaminophen (NORCO) 7.5-325 MG tablet, Take 1 tablet by mouth every 8 (eight) hours as needed for moderate pain., Disp: 15 tablet, Rfl: 0   ipratropium (ATROVENT) 0.03 % nasal spray, 1-2 sprays in each nostril, Disp: , Rfl:    levocetirizine (XYZAL) 5 MG tablet, Take 5 mg by mouth every evening., Disp: , Rfl:    levothyroxine (SYNTHROID) 112 MCG tablet, Take 112 mcg by mouth daily before breakfast., Disp: , Rfl:    loperamide (IMODIUM) 2 MG capsule, Take 1 capsule (2 mg total) by mouth as needed for diarrhea or loose stools., Disp: 30 capsule, Rfl: 0   Melatonin 5 MG TABS, Take 20 mg by mouth at bedtime., Disp: , Rfl:    metoprolol succinate (TOPROL-XL) 50 MG 24 hr tablet, Take 1 tablet (50 mg total) by mouth daily. Take with or immediately following a meal. Hold for low blood pressure or heart rate less than 60., Disp: , Rfl: 3   Multiple Vitamin (MULTIVITAMIN WITH MINERALS) TABS tablet, Take 1 tablet by mouth daily., Disp: , Rfl:    naloxone (NARCAN) 0.4 MG/ML injection, , Disp: , Rfl:    ondansetron (ZOFRAN) 4 MG tablet, Take 1 tablet (4 mg total) by mouth every 8 (eight) hours as needed for nausea or vomiting., Disp: 20 tablet, Rfl: 0   pantoprazole (PROTONIX) 40 MG tablet, Take 1 tablet (40 mg total) by mouth daily., Disp: 90 tablet, Rfl: 0   pravastatin (PRAVACHOL) 20 MG tablet, Take 20 mg by mouth daily., Disp: , Rfl: 3   pregabalin (LYRICA) 150 MG capsule, Take  1 capsule (150 mg total) by mouth 2 (two) times daily. (Patient taking differently: Take 150 mg by mouth 2 (two) times daily. 3 tabs daily), Disp: , Rfl:    QUEtiapine (SEROQUEL) 200 MG tablet, Take 1 tablet (200 mg total) by mouth at bedtime., Disp: 90 tablet, Rfl: 0   senna-docusate (PERI-COLACE) 8.6-50 MG tablet, Take 2 tablets by mouth 2 (two) times daily. For constipation, Disp: , Rfl:     verapamil (VERELAN PM) 120 MG 24 hr capsule, Take 1 capsule (120 mg total) by mouth at bedtime. Hold for low blood pressures, Disp: , Rfl:    vitamin B-12 1000 MCG tablet, Take 1 tablet (1,000 mcg total) by mouth daily., Disp: , Rfl:    warfarin (COUMADIN) 2.5 MG tablet, Take 1.5 - 2 tablets once daily or AS DIRECTED BY COUMADIN CLINIC, Disp: 150 tablet, Rfl: 0    Review of Systems  Constitutional:  Negative for chills, diaphoresis and fever.  HENT:  Negative for congestion, hearing loss, sore throat and tinnitus.   Eyes:  Negative for photophobia, redness and visual disturbance.  Respiratory:  Negative for cough, shortness of breath and wheezing.   Cardiovascular:  Negative for chest pain, palpitations and leg swelling.  Gastrointestinal:  Negative for abdominal pain, blood in stool, constipation, diarrhea, nausea and vomiting.  Genitourinary:  Negative for dysuria, flank pain and hematuria.  Musculoskeletal:  Negative for back pain and myalgias.  Skin:  Negative for rash.  Neurological:  Negative for dizziness, weakness and headaches.  Hematological:  Does not bruise/bleed easily.  Psychiatric/Behavioral:  Positive for confusion. Negative for behavioral problems, decreased concentration, dysphoric mood, hallucinations, self-injury and suicidal ideas. The patient is not nervous/anxious and is not hyperactive.       Objective:   Physical Exam Constitutional:      General: She is not in acute distress.    Appearance: She is well-developed. She is not diaphoretic.  HENT:     Head: Normocephalic and atraumatic.     Mouth/Throat:     Pharynx: No oropharyngeal exudate.  Eyes:     General: No scleral icterus.    Conjunctiva/sclera: Conjunctivae normal.  Cardiovascular:     Rate and Rhythm: Normal rate and regular rhythm.  Pulmonary:     Effort: Pulmonary effort is normal. No respiratory distress.     Breath sounds: No wheezing.  Abdominal:     General: There is no distension.   Musculoskeletal:        General: No tenderness.     Cervical back: Normal range of motion and neck supple.  Skin:    General: Skin is warm and dry.     Coloration: Skin is not pale.     Findings: No erythema or rash.  Neurological:     General: No focal deficit present.     Mental Status: She is alert and oriented to person, place, and time.     Motor: No abnormal muscle tone.     Coordination: Coordination normal.  Psychiatric:        Attention and Perception: Attention and perception normal.        Mood and Affect: Mood normal.        Speech: Speech normal.        Behavior: Behavior normal.        Thought Content: Thought content normal.        Judgment: Judgment normal.   07/07/2020 visit      08/05/2020:      09/02/2020:  Ball of her foot where she has some pain and also tenderness:        Assessment & Plan:   Septic arthritis of right hallux IP joint where there is also radiographic suggestion of osteomyelitis:   She has had sufficient doxycycline at this point in time.  We will check inflammatory markers CBC and metabolic panel.  SHE IS TO STOP HER DOXYCYLINE AND RTC TO SEE ME OFF ANTIBIOTICS IN 2 MONTHS  History of C. difficile colitis: Fortunately has not had recurrence and is on a low risk antibiotic with doxycycline.  On anticoagulation she is on Coumadin through cardiology   Bipolar disorder with psychotic break: Seems to have resolved now.

## 2020-09-03 LAB — BASIC METABOLIC PANEL WITH GFR
BUN/Creatinine Ratio: 18 (calc) (ref 6–22)
BUN: 17 mg/dL (ref 7–25)
CO2: 30 mmol/L (ref 20–32)
Calcium: 9.6 mg/dL (ref 8.6–10.4)
Chloride: 104 mmol/L (ref 98–110)
Creat: 0.94 mg/dL — ABNORMAL HIGH (ref 0.60–0.93)
GFR, Est African American: 71 mL/min/{1.73_m2} (ref >=60)
GFR, Est Non African American: 61 mL/min/{1.73_m2} (ref >=60)
Glucose, Bld: 90 mg/dL (ref 65–99)
Potassium: 4.4 mmol/L (ref 3.5–5.3)
Sodium: 141 mmol/L (ref 135–146)

## 2020-09-03 LAB — CBC WITH DIFFERENTIAL/PLATELET
Absolute Monocytes: 513 cells/uL (ref 200–950)
Basophils Absolute: 51 cells/uL (ref 0–200)
Basophils Relative: 0.9 %
Eosinophils Absolute: 80 cells/uL (ref 15–500)
Eosinophils Relative: 1.4 %
HCT: 38.3 % (ref 35.0–45.0)
Hemoglobin: 12.4 g/dL (ref 11.7–15.5)
Lymphs Abs: 1664 cells/uL (ref 850–3900)
MCH: 29.5 pg (ref 27.0–33.0)
MCHC: 32.4 g/dL (ref 32.0–36.0)
MCV: 91.2 fL (ref 80.0–100.0)
MPV: 9.9 fL (ref 7.5–12.5)
Monocytes Relative: 9 %
Neutro Abs: 3392 cells/uL (ref 1500–7800)
Neutrophils Relative %: 59.5 %
Platelets: 288 10*3/uL (ref 140–400)
RBC: 4.2 10*6/uL (ref 3.80–5.10)
RDW: 12.7 % (ref 11.0–15.0)
Total Lymphocyte: 29.2 %
WBC: 5.7 10*3/uL (ref 3.8–10.8)

## 2020-09-03 LAB — C-REACTIVE PROTEIN: CRP: 5.8 mg/L (ref <8.0)

## 2020-09-03 LAB — SEDIMENTATION RATE: Sed Rate: 29 mm/h (ref 0–30)

## 2020-09-06 ENCOUNTER — Ambulatory Visit (INDEPENDENT_AMBULATORY_CARE_PROVIDER_SITE_OTHER): Payer: Medicare Other | Admitting: Psychology

## 2020-09-06 DIAGNOSIS — F3181 Bipolar II disorder: Secondary | ICD-10-CM | POA: Diagnosis not present

## 2020-09-07 DIAGNOSIS — R2689 Other abnormalities of gait and mobility: Secondary | ICD-10-CM | POA: Diagnosis not present

## 2020-09-07 DIAGNOSIS — N289 Disorder of kidney and ureter, unspecified: Secondary | ICD-10-CM | POA: Diagnosis not present

## 2020-09-07 DIAGNOSIS — G8929 Other chronic pain: Secondary | ICD-10-CM | POA: Diagnosis not present

## 2020-09-07 DIAGNOSIS — I4891 Unspecified atrial fibrillation: Secondary | ICD-10-CM | POA: Diagnosis not present

## 2020-09-07 DIAGNOSIS — Z7901 Long term (current) use of anticoagulants: Secondary | ICD-10-CM | POA: Diagnosis not present

## 2020-09-07 DIAGNOSIS — E669 Obesity, unspecified: Secondary | ICD-10-CM | POA: Diagnosis not present

## 2020-09-07 DIAGNOSIS — G894 Chronic pain syndrome: Secondary | ICD-10-CM | POA: Diagnosis not present

## 2020-09-07 DIAGNOSIS — M6281 Muscle weakness (generalized): Secondary | ICD-10-CM | POA: Diagnosis not present

## 2020-09-07 DIAGNOSIS — F3164 Bipolar disorder, current episode mixed, severe, with psychotic features: Secondary | ICD-10-CM | POA: Diagnosis not present

## 2020-09-07 DIAGNOSIS — I480 Paroxysmal atrial fibrillation: Secondary | ICD-10-CM | POA: Diagnosis not present

## 2020-09-07 DIAGNOSIS — Z6836 Body mass index (BMI) 36.0-36.9, adult: Secondary | ICD-10-CM | POA: Diagnosis not present

## 2020-09-07 DIAGNOSIS — F319 Bipolar disorder, unspecified: Secondary | ICD-10-CM | POA: Diagnosis not present

## 2020-09-07 DIAGNOSIS — G9340 Encephalopathy, unspecified: Secondary | ICD-10-CM | POA: Diagnosis not present

## 2020-09-07 DIAGNOSIS — R41841 Cognitive communication deficit: Secondary | ICD-10-CM | POA: Diagnosis not present

## 2020-09-07 DIAGNOSIS — R278 Other lack of coordination: Secondary | ICD-10-CM | POA: Diagnosis not present

## 2020-09-12 DIAGNOSIS — R2689 Other abnormalities of gait and mobility: Secondary | ICD-10-CM | POA: Diagnosis not present

## 2020-09-12 DIAGNOSIS — I4891 Unspecified atrial fibrillation: Secondary | ICD-10-CM | POA: Diagnosis not present

## 2020-09-12 DIAGNOSIS — N289 Disorder of kidney and ureter, unspecified: Secondary | ICD-10-CM | POA: Diagnosis not present

## 2020-09-12 DIAGNOSIS — R41841 Cognitive communication deficit: Secondary | ICD-10-CM | POA: Diagnosis not present

## 2020-09-12 DIAGNOSIS — R278 Other lack of coordination: Secondary | ICD-10-CM | POA: Diagnosis not present

## 2020-09-12 DIAGNOSIS — G9340 Encephalopathy, unspecified: Secondary | ICD-10-CM | POA: Diagnosis not present

## 2020-09-13 DIAGNOSIS — R5383 Other fatigue: Secondary | ICD-10-CM | POA: Diagnosis not present

## 2020-09-13 DIAGNOSIS — Z09 Encounter for follow-up examination after completed treatment for conditions other than malignant neoplasm: Secondary | ICD-10-CM | POA: Diagnosis not present

## 2020-09-13 DIAGNOSIS — I700 Atherosclerosis of aorta: Secondary | ICD-10-CM | POA: Diagnosis not present

## 2020-09-13 DIAGNOSIS — N183 Chronic kidney disease, stage 3 unspecified: Secondary | ICD-10-CM | POA: Diagnosis not present

## 2020-09-13 DIAGNOSIS — E876 Hypokalemia: Secondary | ICD-10-CM | POA: Diagnosis not present

## 2020-09-13 DIAGNOSIS — Z6835 Body mass index (BMI) 35.0-35.9, adult: Secondary | ICD-10-CM | POA: Diagnosis not present

## 2020-09-13 DIAGNOSIS — E538 Deficiency of other specified B group vitamins: Secondary | ICD-10-CM | POA: Diagnosis not present

## 2020-09-13 DIAGNOSIS — E559 Vitamin D deficiency, unspecified: Secondary | ICD-10-CM | POA: Diagnosis not present

## 2020-09-13 DIAGNOSIS — R296 Repeated falls: Secondary | ICD-10-CM | POA: Diagnosis not present

## 2020-09-14 ENCOUNTER — Ambulatory Visit (INDEPENDENT_AMBULATORY_CARE_PROVIDER_SITE_OTHER): Payer: Medicare Other | Admitting: Psychology

## 2020-09-14 DIAGNOSIS — F3181 Bipolar II disorder: Secondary | ICD-10-CM | POA: Diagnosis not present

## 2020-09-15 ENCOUNTER — Other Ambulatory Visit: Payer: Self-pay

## 2020-09-15 ENCOUNTER — Ambulatory Visit (INDEPENDENT_AMBULATORY_CARE_PROVIDER_SITE_OTHER): Payer: Medicare Other | Admitting: *Deleted

## 2020-09-15 DIAGNOSIS — Z5181 Encounter for therapeutic drug level monitoring: Secondary | ICD-10-CM | POA: Diagnosis not present

## 2020-09-15 DIAGNOSIS — I480 Paroxysmal atrial fibrillation: Secondary | ICD-10-CM | POA: Diagnosis not present

## 2020-09-15 LAB — POCT INR: INR: 3.1 — AB (ref 2.0–3.0)

## 2020-09-15 NOTE — Patient Instructions (Addendum)
Description   Tomorrow take 1 tablet of Warfarin then continue taking Warfarin 1.5 tablets everyday. Add one more green leafy vegetable to your diet. Recheck INR in 2 weeks. Call with any medication changes or if you are scheduled for surgery [facility] [phone] Main [phone]

## 2020-09-21 DIAGNOSIS — I4891 Unspecified atrial fibrillation: Secondary | ICD-10-CM | POA: Diagnosis not present

## 2020-09-21 DIAGNOSIS — R278 Other lack of coordination: Secondary | ICD-10-CM | POA: Diagnosis not present

## 2020-09-21 DIAGNOSIS — R2689 Other abnormalities of gait and mobility: Secondary | ICD-10-CM | POA: Diagnosis not present

## 2020-09-21 DIAGNOSIS — R41841 Cognitive communication deficit: Secondary | ICD-10-CM | POA: Diagnosis not present

## 2020-09-21 DIAGNOSIS — N289 Disorder of kidney and ureter, unspecified: Secondary | ICD-10-CM | POA: Diagnosis not present

## 2020-09-21 DIAGNOSIS — G9340 Encephalopathy, unspecified: Secondary | ICD-10-CM | POA: Diagnosis not present

## 2020-09-22 DIAGNOSIS — M5459 Other low back pain: Secondary | ICD-10-CM | POA: Diagnosis not present

## 2020-09-22 DIAGNOSIS — G894 Chronic pain syndrome: Secondary | ICD-10-CM | POA: Diagnosis not present

## 2020-09-22 DIAGNOSIS — T85192A Other mechanical complication of implanted electronic neurostimulator (electrode) of spinal cord, initial encounter: Secondary | ICD-10-CM | POA: Diagnosis not present

## 2020-09-22 DIAGNOSIS — M47816 Spondylosis without myelopathy or radiculopathy, lumbar region: Secondary | ICD-10-CM | POA: Diagnosis not present

## 2020-09-22 DIAGNOSIS — M79673 Pain in unspecified foot: Secondary | ICD-10-CM | POA: Diagnosis not present

## 2020-09-22 DIAGNOSIS — M961 Postlaminectomy syndrome, not elsewhere classified: Secondary | ICD-10-CM | POA: Diagnosis not present

## 2020-09-22 DIAGNOSIS — M25559 Pain in unspecified hip: Secondary | ICD-10-CM | POA: Diagnosis not present

## 2020-09-27 DIAGNOSIS — R278 Other lack of coordination: Secondary | ICD-10-CM | POA: Diagnosis not present

## 2020-09-27 DIAGNOSIS — N289 Disorder of kidney and ureter, unspecified: Secondary | ICD-10-CM | POA: Diagnosis not present

## 2020-09-27 DIAGNOSIS — R41841 Cognitive communication deficit: Secondary | ICD-10-CM | POA: Diagnosis not present

## 2020-09-27 DIAGNOSIS — R2689 Other abnormalities of gait and mobility: Secondary | ICD-10-CM | POA: Diagnosis not present

## 2020-09-27 DIAGNOSIS — I4891 Unspecified atrial fibrillation: Secondary | ICD-10-CM | POA: Diagnosis not present

## 2020-09-27 DIAGNOSIS — G9340 Encephalopathy, unspecified: Secondary | ICD-10-CM | POA: Diagnosis not present

## 2020-09-28 ENCOUNTER — Ambulatory Visit (INDEPENDENT_AMBULATORY_CARE_PROVIDER_SITE_OTHER): Payer: Medicare Other | Admitting: Psychology

## 2020-09-28 DIAGNOSIS — F3181 Bipolar II disorder: Secondary | ICD-10-CM | POA: Diagnosis not present

## 2020-09-29 ENCOUNTER — Ambulatory Visit (INDEPENDENT_AMBULATORY_CARE_PROVIDER_SITE_OTHER): Payer: Medicare Other

## 2020-09-29 ENCOUNTER — Other Ambulatory Visit: Payer: Self-pay

## 2020-09-29 DIAGNOSIS — Z5181 Encounter for therapeutic drug level monitoring: Secondary | ICD-10-CM | POA: Diagnosis not present

## 2020-09-29 DIAGNOSIS — I480 Paroxysmal atrial fibrillation: Secondary | ICD-10-CM | POA: Diagnosis not present

## 2020-09-29 LAB — POCT INR: INR: 1.9 — AB (ref 2.0–3.0)

## 2020-09-29 NOTE — Patient Instructions (Signed)
Description   Take 2 tables tomorrow and then continue taking Warfarin 1.5 tablets everyday. Add one more green leafy vegetable to your diet. Recheck INR in 2 weeks. Call with any medication changes or if you are scheduled for surgery [facility] [phone] Main [phone]

## 2020-10-03 ENCOUNTER — Telehealth: Payer: Self-pay

## 2020-10-03 DIAGNOSIS — M791 Myalgia, unspecified site: Secondary | ICD-10-CM | POA: Diagnosis not present

## 2020-10-03 DIAGNOSIS — G43719 Chronic migraine without aura, intractable, without status migrainosus: Secondary | ICD-10-CM | POA: Diagnosis not present

## 2020-10-03 DIAGNOSIS — M542 Cervicalgia: Secondary | ICD-10-CM | POA: Diagnosis not present

## 2020-10-03 DIAGNOSIS — G518 Other disorders of facial nerve: Secondary | ICD-10-CM | POA: Diagnosis not present

## 2020-10-03 NOTE — Telephone Encounter (Signed)
Patient called office today to inform MD that she restarted her Doxycyline. States that on Friday she cut her ingrown toenail on her right big toe. Noticed that her toe was hurting and sensitive to the touch. Has resolved this past weekend, has only notices some soreness. Patent would like to know if it is okay to continue taking the doxycyline and if she should move her appointment to earlier date/time to be evaluated.  Denies any drainage, redness, swelling, or toe feeling hot to the touch. Leatrice Jewels, RMA

## 2020-10-04 DIAGNOSIS — R2689 Other abnormalities of gait and mobility: Secondary | ICD-10-CM | POA: Diagnosis not present

## 2020-10-04 DIAGNOSIS — R41841 Cognitive communication deficit: Secondary | ICD-10-CM | POA: Diagnosis not present

## 2020-10-04 DIAGNOSIS — G9340 Encephalopathy, unspecified: Secondary | ICD-10-CM | POA: Diagnosis not present

## 2020-10-04 DIAGNOSIS — N289 Disorder of kidney and ureter, unspecified: Secondary | ICD-10-CM | POA: Diagnosis not present

## 2020-10-04 DIAGNOSIS — I4891 Unspecified atrial fibrillation: Secondary | ICD-10-CM | POA: Diagnosis not present

## 2020-10-04 DIAGNOSIS — R278 Other lack of coordination: Secondary | ICD-10-CM | POA: Diagnosis not present

## 2020-10-04 NOTE — Telephone Encounter (Signed)
Called patient to offer earlier appointment, no answer. Left HIPAA compliant voicemail requesting callback.   Beryle Flock, RN

## 2020-10-04 NOTE — Telephone Encounter (Addendum)
Patient returned call, scheduled for earlier appointment on 10/10/20. She states the ingrown nail is on the same toe that was being treated for infection. Relayed that okay to continue doxycycline until seen per Dr. Tommy Medal. Patient verbalized understanding and has no further questions.   Beryle Flock, RN

## 2020-10-07 DIAGNOSIS — G894 Chronic pain syndrome: Secondary | ICD-10-CM | POA: Diagnosis not present

## 2020-10-07 DIAGNOSIS — N289 Disorder of kidney and ureter, unspecified: Secondary | ICD-10-CM | POA: Diagnosis not present

## 2020-10-07 DIAGNOSIS — R2689 Other abnormalities of gait and mobility: Secondary | ICD-10-CM | POA: Diagnosis not present

## 2020-10-07 DIAGNOSIS — R278 Other lack of coordination: Secondary | ICD-10-CM | POA: Diagnosis not present

## 2020-10-07 DIAGNOSIS — I4891 Unspecified atrial fibrillation: Secondary | ICD-10-CM | POA: Diagnosis not present

## 2020-10-07 DIAGNOSIS — F3164 Bipolar disorder, current episode mixed, severe, with psychotic features: Secondary | ICD-10-CM | POA: Diagnosis not present

## 2020-10-07 DIAGNOSIS — G9340 Encephalopathy, unspecified: Secondary | ICD-10-CM | POA: Diagnosis not present

## 2020-10-07 DIAGNOSIS — Z6836 Body mass index (BMI) 36.0-36.9, adult: Secondary | ICD-10-CM | POA: Diagnosis not present

## 2020-10-07 DIAGNOSIS — R41841 Cognitive communication deficit: Secondary | ICD-10-CM | POA: Diagnosis not present

## 2020-10-07 DIAGNOSIS — E669 Obesity, unspecified: Secondary | ICD-10-CM | POA: Diagnosis not present

## 2020-10-10 ENCOUNTER — Encounter: Payer: Self-pay | Admitting: Infectious Disease

## 2020-10-10 ENCOUNTER — Ambulatory Visit (INDEPENDENT_AMBULATORY_CARE_PROVIDER_SITE_OTHER): Payer: Medicare Other | Admitting: Infectious Disease

## 2020-10-10 ENCOUNTER — Other Ambulatory Visit: Payer: Self-pay

## 2020-10-10 VITALS — BP 119/75 | HR 68 | Temp 98.0°F | Resp 16 | Ht 59.0 in | Wt 178.0 lb

## 2020-10-10 DIAGNOSIS — R2689 Other abnormalities of gait and mobility: Secondary | ICD-10-CM | POA: Diagnosis not present

## 2020-10-10 DIAGNOSIS — R41841 Cognitive communication deficit: Secondary | ICD-10-CM | POA: Diagnosis not present

## 2020-10-10 DIAGNOSIS — I480 Paroxysmal atrial fibrillation: Secondary | ICD-10-CM | POA: Diagnosis not present

## 2020-10-10 DIAGNOSIS — N289 Disorder of kidney and ureter, unspecified: Secondary | ICD-10-CM | POA: Diagnosis not present

## 2020-10-10 DIAGNOSIS — M009 Pyogenic arthritis, unspecified: Secondary | ICD-10-CM

## 2020-10-10 DIAGNOSIS — G9340 Encephalopathy, unspecified: Secondary | ICD-10-CM | POA: Diagnosis not present

## 2020-10-10 DIAGNOSIS — L600 Ingrowing nail: Secondary | ICD-10-CM

## 2020-10-10 DIAGNOSIS — R278 Other lack of coordination: Secondary | ICD-10-CM | POA: Diagnosis not present

## 2020-10-10 DIAGNOSIS — I4891 Unspecified atrial fibrillation: Secondary | ICD-10-CM | POA: Diagnosis not present

## 2020-10-10 DIAGNOSIS — A0472 Enterocolitis due to Clostridium difficile, not specified as recurrent: Secondary | ICD-10-CM

## 2020-10-10 HISTORY — DX: Ingrowing nail: L60.0

## 2020-10-10 NOTE — Progress Notes (Signed)
r  Subjective:  Chief complaint: Follow-up for osteomyelitis associate with hardware and toe now w recent ingrown toenail   Patient ID: Meredith [name], female    DOB: 11-01-1950, 70 y.o.   MRN: [id number]  HPI  Meredith [name] is a 70 year old Caucasian female that I saw in the clinic l in early May 2022   She has a history of diabetes mellitus mellitus atrial fibrillation on anticoagulation who had history of right forefoot pain and had podiatric surgery to the right hallux including MP joint replacement and IP joint resection arthroplasty.   She was evaluated by 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  Labs including sed rate and CRP were normal.  An MRI was not clear-cut as far as revealing pathology radiology thought this could be gout but also could potentially be osteomyelitis epic arthritis involving the IP joint.   She was taken to the operating room by [name].  In the operating room he found significant erosion of the distal portion of the proximal phalanx.  Bone specimens were taken for pathology and cultures were taken as well.   Hardware has been removed. Biopsy showed benign fibroelastic tissue.   Cultures did yield a Staph capitis was sensitive to essentially all antistaphylococcal antibiotics.   [name] called me about the patient and we decided to initiate doxycycline and to get her into our clinic.   I think that despite the fact that the patient's pathology result results were reassuring that she did not have a bone infection that her inflammatory markers are reassuring that we still nonetheless left with radiographic findings suggestive of osteomyelitis and a positive culture with him taking an in a sterile setting from the operating room in an area where infection was suspected.   I felt strongly that minimum we should do is give her 6 weeks of systemic IV antibiotics.   However she had  developed several concerns about the IV  antibiotics.   1.  She was concerned about the cost and was told that it would be $600 out-of-pocket for the 6 weeks of treatment.   .   2.  She was concerned about the PICC line getting infected which is certainly a legitimate concern.   3.  We talked about blood clots though she is on anticoagulation with Coumadin.   4.  She was concerned about being able to administer IV antibiotics to herself without help which I think she could do and most of our patients are capable of doing.     #5 she was concerned about being able to take a shower and being able to wrap the PICC line sufficiently to take sleep safely take a shower.     The end of our conversation we ultimately decided to simply stick with oral doxycycline.  She saw [name] for follow-up.  She continued to do clinically well.  Of note she never had pain as a presenting symptom of her infection.  Instead she had change in the alignment of her toes and the podiatrist then ordered an MRI.  She is sp  8 weeks if not more of doxycycline her inflammatory markers were normal last time I checked them.  She was admitted to [facility] with apparently a psychotic break it seems prior to my last visit with her.   We were observing her off of antibiotics but she then had area of pain in toe that was operated on and she started  doxycycline again for what she thought was an ingrown toenail.  That pain has subsided and she finished 10 days of doxycyline.  She does not have pain in the toe joint itself.  I was concerned upon hearing her phone call re her toe hurting that she might have recurrent infection.       Past Medical History:  Diagnosis Date   Anxiety    Atrial fibrillation ([place])    Bipolar 2 disorder ([place])    Depression    Hematoma 07/06/2020   History of cardioversion    x2   Hyperlipidemia    Hypothyroidism    Migraine headache    Osteoporosis    Septic arthritis of interphalangeal joint of toe of right foot  ([place]) 07/06/2020    Past Surgical History:  Procedure Laterality Date   ABLATION     BACK SURGERY  2014   CARDIOVERSION  12/2013, 01/2014   x2   KNEE SURGERY     ORTHOSCOPIC   LUNG REMOVAL, PARTIAL Right    BENIGN LUNG CYST   SYNOVIAL BIOPSY Right 06/16/2020   Procedure: Right hallux interphalangeal joint arthrotomy and biopsy;  Surgeon: [name], MD;  Location: [facility];  Service: Orthopedics;  Laterality: Right;   THORACIC SPINE SURGERY Right    THUMB ARTHROSCOPY      Family History  Problem Relation Age of Onset   Arthritis Mother    Depression Mother    Diabetes Mother    Heart disease Mother    Stroke Mother    Early death Father    Heart disease Father    Atrial fibrillation Brother    Hyperlipidemia Brother    Multiple sclerosis Sister    Alcohol abuse Brother    Asthma Brother    Drug abuse Brother    Hypertension Brother    Mental illness Brother       Social History   Socioeconomic History   Marital status: Single    Spouse name: Not on file   Number of children: 3   Years of education: Not on file   Highest education level: Not on file  Occupational History   Occupation: Retired  Tobacco Use   Smoking status: Never   Smokeless tobacco: Never  Scientific laboratory technician Use: Not on file  Substance and Sexual Activity   Alcohol use: Not Currently   Drug use: No   Sexual activity: Not Currently  Other Topics Concern   Not on file  Social History Narrative   Not on file   Social Determinants of Health   Financial Resource Strain: Not on file  Food Insecurity: Not on file  Transportation Needs: Not on file  Physical Activity: Not on file  Stress: Not on file  Social Connections: Not on file    Allergies  Allergen Reactions   Codeine Anaphylaxis    Patient states she can take codeine but not percocet    Adhesive [Tape]     blister   Celebrex [Celecoxib] Hives   Erythromycin Other (See Comments)   Other     Other  reaction(s): MAKE HER CRAZY Other reaction(s): rash, high fever,welts Other reaction(s): rash Other reaction(s): severe diarrhea/vomiting Other reaction(s): Unknown   Penicillamine Diarrhea    Other reaction(s): Vomiting   Sulfa Antibiotics Hives   Tricyclic Antidepressants     Doesn't help   Amoxicillin Rash   Ampicillin Rash   Cephalexin Diarrhea   Macrodantin [Nitrofurantoin] Rash   Penicillins Rash  Did it involve swelling of the face/tongue/throat, SOB, or low BP? Yes Did it involve sudden or severe rash/hives, skin peeling, or any reaction on the inside of your mouth or nose? NO Did you need to seek medical attention at a hospital or doctor's office? NO When did it last happen? childhood       If all above answers are "NO", may proceed with cephalosporin use.     Current Outpatient Medications:    Ascorbic Acid (VITAMIN C) 1000 MG tablet, Take 1,000 mg by mouth daily., Disp: , Rfl:    aspirin 325 MG tablet, Take 325 mg by mouth daily as needed for headache. , Disp: , Rfl:    baclofen (LIORESAL) 10 MG tablet, Take 1 tablet by mouth as needed., Disp: , Rfl:    bisacodyl (DULCOLAX) 5 MG EC tablet, PRN, Disp: , Rfl:    Cholecalciferol (VITAMIN D3) 125 MCG (5000 UT) TABS, Take 1 tablet by mouth daily at 12 noon., Disp: , Rfl:    clotrimazole (LOTRIMIN) 1 % cream, APPLY CREAM TOPICALLY TO AFFECTED AREA TWICE DAILY, Disp: , Rfl:    docusate sodium (COLACE) 100 MG capsule, 1 capsule as needed, Disp: , Rfl:    EPINEPHrine 0.3 mg/0.3 mL IJ SOAJ injection, , Disp: , Rfl:    estradiol (ESTRACE) 1 MG tablet, Take 1 mg by mouth daily., Disp: , Rfl:    ferrous sulfate 325 (65 FE) MG tablet, Take 325 mg by mouth 2 (two) times daily., Disp: , Rfl:    Galcanezumab-gnlm (EMGALITY) 120 MG/ML SOAJ, Inject into the skin., Disp: , Rfl:    HYDROcodone-acetaminophen (NORCO) 7.5-325 MG tablet, Take 1 tablet by mouth every 8 (eight) hours as needed for moderate pain., Disp: 15 tablet, Rfl: 0    ipratropium (ATROVENT) 0.03 % nasal spray, 1-2 sprays in each nostril, Disp: , Rfl:    levocetirizine (XYZAL) 5 MG tablet, Take 5 mg by mouth every evening., Disp: , Rfl:    levothyroxine (SYNTHROID) 112 MCG tablet, Take 112 mcg by mouth daily before breakfast., Disp: , Rfl:    loperamide (IMODIUM) 2 MG capsule, Take 1 capsule (2 mg total) by mouth as needed for diarrhea or loose stools., Disp: 30 capsule, Rfl: 0   Melatonin 5 MG TABS, Take 20 mg by mouth at bedtime., Disp: , Rfl:    metoprolol succinate (TOPROL-XL) 50 MG 24 hr tablet, Take 1 tablet (50 mg total) by mouth daily. Take with or immediately following a meal. Hold for low blood pressure or heart rate less than 60., Disp: , Rfl: 3   Multiple Vitamin (MULTIVITAMIN WITH MINERALS) TABS tablet, Take 1 tablet by mouth daily., Disp: , Rfl:    naloxone (NARCAN) 0.4 MG/ML injection, , Disp: , Rfl:    ondansetron (ZOFRAN) 4 MG tablet, Take 1 tablet (4 mg total) by mouth every 8 (eight) hours as needed for nausea or vomiting., Disp: 20 tablet, Rfl: 0   pantoprazole (PROTONIX) 40 MG tablet, Take 1 tablet (40 mg total) by mouth daily., Disp: 90 tablet, Rfl: 0   pravastatin (PRAVACHOL) 20 MG tablet, Take 20 mg by mouth daily., Disp: , Rfl: 3   pregabalin (LYRICA) 150 MG capsule, Take 1 capsule (150 mg total) by mouth 2 (two) times daily. (Patient taking differently: Take 150 mg by mouth 2 (two) times daily. 3 tabs daily), Disp: , Rfl:    QUEtiapine (SEROQUEL) 200 MG tablet, Take 1 tablet (200 mg total) by mouth at bedtime., Disp: 90 tablet, Rfl: 0  verapamil (VERELAN PM) 120 MG 24 hr capsule, Take 1 capsule (120 mg total) by mouth at bedtime. Hold for low blood pressures, Disp: , Rfl:    vitamin B-12 1000 MCG tablet, Take 1 tablet (1,000 mcg total) by mouth daily., Disp: , Rfl:    warfarin (COUMADIN) 2.5 MG tablet, Take 1.5 - 2 tablets once daily or AS DIRECTED BY COUMADIN CLINIC, Disp: 150 tablet, Rfl: 0    Review of Systems  Constitutional:   Negative for activity change, appetite change, chills, diaphoresis, fatigue, fever and unexpected weight change.  HENT:  Negative for congestion, rhinorrhea, sinus pressure, sneezing, sore throat and trouble swallowing.   Eyes:  Negative for photophobia and visual disturbance.  Respiratory:  Negative for cough, chest tightness, shortness of breath, wheezing and stridor.   Cardiovascular:  Negative for chest pain, palpitations and leg swelling.  Gastrointestinal:  Negative for abdominal distention, abdominal pain, anal bleeding, blood in stool, constipation, diarrhea, nausea and vomiting.  Genitourinary:  Negative for difficulty urinating, dysuria, flank pain and hematuria.  Musculoskeletal:  Negative for arthralgias, back pain, gait problem, joint swelling and myalgias.  Skin:  Positive for color change. Negative for pallor, rash and wound.  Neurological:  Negative for dizziness, tremors, weakness and light-headedness.  Hematological:  Negative for adenopathy. Does not bruise/bleed easily.  Psychiatric/Behavioral:  Negative for agitation, behavioral problems, confusion, decreased concentration, dysphoric mood and sleep disturbance.       Objective:   Physical Exam Constitutional:      General: She is not in acute distress.    Appearance: Normal appearance. She is well-developed. She is not ill-appearing or diaphoretic.  HENT:     Head: Normocephalic and atraumatic.     Right Ear: Hearing and external ear normal.     Left Ear: Hearing and external ear normal.     Nose: No nasal deformity or rhinorrhea.  Eyes:     General: No scleral icterus.    Conjunctiva/sclera: Conjunctivae normal.     Right eye: Right conjunctiva is not injected.     Left eye: Left conjunctiva is not injected.     Pupils: Pupils are equal, round, and reactive to light.  Neck:     Vascular: No JVD.  Cardiovascular:     Rate and Rhythm: Normal rate and regular rhythm.     Heart sounds: S1 normal and S2 normal.   Pulmonary:     Effort: Pulmonary effort is normal. No respiratory distress.     Breath sounds: No wheezing.  Abdominal:     General: Bowel sounds are normal. There is no distension.     Palpations: Abdomen is soft.     Tenderness: There is no abdominal tenderness.  Musculoskeletal:        General: Normal range of motion.     Right shoulder: Normal.     Left shoulder: Normal.     Cervical back: Normal range of motion and neck supple.     Right hip: Normal.     Left hip: Normal.     Right knee: Normal.     Left knee: Normal.  Lymphadenopathy:     Head:     Right side of head: No submandibular, preauricular or posterior auricular adenopathy.     Left side of head: No submandibular, preauricular or posterior auricular adenopathy.     Cervical: No cervical adenopathy.     Right cervical: No superficial or deep cervical adenopathy.    Left cervical: No superficial or deep cervical adenopathy.  Skin:    General: Skin is warm and dry.     Coloration: Skin is not pale.     Findings: No abrasion, bruising, ecchymosis, erythema, lesion or rash.     Nails: There is no clubbing.  Neurological:     General: No focal deficit present.     Mental Status: She is alert and oriented to person, place, and time.     Sensory: No sensory deficit.     Coordination: Coordination normal.     Gait: Gait normal.  Psychiatric:        Attention and Perception: She is attentive.        Mood and Affect: Mood normal.        Speech: Speech normal.        Behavior: Behavior normal. Behavior is cooperative.        Thought Content: Thought content normal.        Judgment: Judgment normal.   07/07/2020 visit      08/05/2020:      09/02/2020:     Toe 10/10/2020 where she had ingrown nail:       Assessment & Plan:   Ingrown nail; the erythema has resolved. She can stop her doxycyline  Septic arthritis of right hallux IP joint and suggestion of osteomyelitis  sp greater than 8 weeks of  doxycyline  We will see her again in September and recheck her toe and inflammatory markers   Hx of  C difficile: has not re-emerged  PAF on coumadin

## 2020-10-11 DIAGNOSIS — G9340 Encephalopathy, unspecified: Secondary | ICD-10-CM | POA: Diagnosis not present

## 2020-10-11 DIAGNOSIS — I4891 Unspecified atrial fibrillation: Secondary | ICD-10-CM | POA: Diagnosis not present

## 2020-10-11 DIAGNOSIS — R41841 Cognitive communication deficit: Secondary | ICD-10-CM | POA: Diagnosis not present

## 2020-10-11 DIAGNOSIS — R2689 Other abnormalities of gait and mobility: Secondary | ICD-10-CM | POA: Diagnosis not present

## 2020-10-11 DIAGNOSIS — R278 Other lack of coordination: Secondary | ICD-10-CM | POA: Diagnosis not present

## 2020-10-11 DIAGNOSIS — N289 Disorder of kidney and ureter, unspecified: Secondary | ICD-10-CM | POA: Diagnosis not present

## 2020-10-12 ENCOUNTER — Ambulatory Visit (INDEPENDENT_AMBULATORY_CARE_PROVIDER_SITE_OTHER): Payer: Medicare Other | Admitting: Psychology

## 2020-10-12 DIAGNOSIS — F3181 Bipolar II disorder: Secondary | ICD-10-CM

## 2020-10-13 ENCOUNTER — Ambulatory Visit (INDEPENDENT_AMBULATORY_CARE_PROVIDER_SITE_OTHER): Payer: Medicare Other | Admitting: *Deleted

## 2020-10-13 ENCOUNTER — Other Ambulatory Visit: Payer: Self-pay

## 2020-10-13 DIAGNOSIS — I480 Paroxysmal atrial fibrillation: Secondary | ICD-10-CM | POA: Diagnosis not present

## 2020-10-13 DIAGNOSIS — Z5181 Encounter for therapeutic drug level monitoring: Secondary | ICD-10-CM | POA: Diagnosis not present

## 2020-10-13 LAB — POCT INR: INR: 2.4 (ref 2.0–3.0)

## 2020-10-13 NOTE — Patient Instructions (Signed)
Description   Continue taking Warfarin 1.5 tablets everyday. Recheck INR in 3 weeks. Call with any medication changes or if you are scheduled for surgery [facility] [phone] Main [phone]

## 2020-10-18 DIAGNOSIS — I4891 Unspecified atrial fibrillation: Secondary | ICD-10-CM | POA: Diagnosis not present

## 2020-10-18 DIAGNOSIS — G9340 Encephalopathy, unspecified: Secondary | ICD-10-CM | POA: Diagnosis not present

## 2020-10-18 DIAGNOSIS — R41841 Cognitive communication deficit: Secondary | ICD-10-CM | POA: Diagnosis not present

## 2020-10-18 DIAGNOSIS — N289 Disorder of kidney and ureter, unspecified: Secondary | ICD-10-CM | POA: Diagnosis not present

## 2020-10-18 DIAGNOSIS — R278 Other lack of coordination: Secondary | ICD-10-CM | POA: Diagnosis not present

## 2020-10-18 DIAGNOSIS — R2689 Other abnormalities of gait and mobility: Secondary | ICD-10-CM | POA: Diagnosis not present

## 2020-10-26 ENCOUNTER — Ambulatory Visit (INDEPENDENT_AMBULATORY_CARE_PROVIDER_SITE_OTHER): Payer: Medicare Other | Admitting: Psychology

## 2020-10-26 DIAGNOSIS — F3181 Bipolar II disorder: Secondary | ICD-10-CM | POA: Diagnosis not present

## 2020-11-01 ENCOUNTER — Other Ambulatory Visit: Payer: Self-pay

## 2020-11-01 ENCOUNTER — Ambulatory Visit (INDEPENDENT_AMBULATORY_CARE_PROVIDER_SITE_OTHER): Payer: Medicare Other

## 2020-11-01 DIAGNOSIS — Z5181 Encounter for therapeutic drug level monitoring: Secondary | ICD-10-CM | POA: Diagnosis not present

## 2020-11-01 DIAGNOSIS — I480 Paroxysmal atrial fibrillation: Secondary | ICD-10-CM

## 2020-11-01 LAB — POCT INR: INR: 2.6 (ref 2.0–3.0)

## 2020-11-01 NOTE — Patient Instructions (Signed)
Description   Continue taking Warfarin 1.5 tablets everyday. Recheck INR in 4 weeks. Call with any medication changes or if you are scheduled for surgery [facility] [phone] Main [phone]

## 2020-11-02 ENCOUNTER — Ambulatory Visit
Admission: RE | Admit: 2020-11-02 | Discharge: 2020-11-02 | Disposition: A | Discharge status: Home / Self Care | Payer: Medicare Other | Source: Ambulatory Visit | Attending: Family Medicine | Admitting: Family Medicine

## 2020-11-02 DIAGNOSIS — Z1231 Encounter for screening mammogram for malignant neoplasm of breast: Secondary | ICD-10-CM

## 2020-11-03 DIAGNOSIS — G894 Chronic pain syndrome: Secondary | ICD-10-CM | POA: Diagnosis not present

## 2020-11-03 DIAGNOSIS — M47816 Spondylosis without myelopathy or radiculopathy, lumbar region: Secondary | ICD-10-CM | POA: Diagnosis not present

## 2020-11-03 DIAGNOSIS — M79673 Pain in unspecified foot: Secondary | ICD-10-CM | POA: Diagnosis not present

## 2020-11-03 DIAGNOSIS — Z23 Encounter for immunization: Secondary | ICD-10-CM | POA: Diagnosis not present

## 2020-11-03 DIAGNOSIS — M25559 Pain in unspecified hip: Secondary | ICD-10-CM | POA: Diagnosis not present

## 2020-11-07 DIAGNOSIS — G518 Other disorders of facial nerve: Secondary | ICD-10-CM | POA: Diagnosis not present

## 2020-11-07 DIAGNOSIS — M542 Cervicalgia: Secondary | ICD-10-CM | POA: Diagnosis not present

## 2020-11-07 DIAGNOSIS — M791 Myalgia, unspecified site: Secondary | ICD-10-CM | POA: Diagnosis not present

## 2020-11-07 DIAGNOSIS — G43719 Chronic migraine without aura, intractable, without status migrainosus: Secondary | ICD-10-CM | POA: Diagnosis not present

## 2020-11-09 ENCOUNTER — Ambulatory Visit (INDEPENDENT_AMBULATORY_CARE_PROVIDER_SITE_OTHER): Payer: Medicare Other | Admitting: Psychology

## 2020-11-09 DIAGNOSIS — F3181 Bipolar II disorder: Secondary | ICD-10-CM

## 2020-11-11 ENCOUNTER — Ambulatory Visit (INDEPENDENT_AMBULATORY_CARE_PROVIDER_SITE_OTHER): Payer: Medicare Other | Admitting: Infectious Disease

## 2020-11-11 ENCOUNTER — Other Ambulatory Visit: Payer: Self-pay

## 2020-11-11 ENCOUNTER — Encounter: Payer: Self-pay | Admitting: Infectious Disease

## 2020-11-11 VITALS — BP 109/73 | HR 69 | Temp 98.4°F | Wt 176.0 lb

## 2020-11-11 DIAGNOSIS — M86271 Subacute osteomyelitis, right ankle and foot: Secondary | ICD-10-CM

## 2020-11-11 DIAGNOSIS — M869 Osteomyelitis, unspecified: Secondary | ICD-10-CM | POA: Insufficient documentation

## 2020-11-11 DIAGNOSIS — M009 Pyogenic arthritis, unspecified: Secondary | ICD-10-CM | POA: Diagnosis not present

## 2020-11-11 DIAGNOSIS — A0472 Enterocolitis due to Clostridium difficile, not specified as recurrent: Secondary | ICD-10-CM | POA: Diagnosis not present

## 2020-11-11 HISTORY — DX: Osteomyelitis, unspecified: M86.9

## 2020-11-11 NOTE — Progress Notes (Signed)
r  Subjective:  Chief complaint: Follow-up for osteomyelitis now off antibiotics altogether   Patient ID: Meredith [name], female    DOB: 18-Jan-1951, 70 y.o.   MRN: [id number]  HPI  Meredith [name] is a 70 year old Caucasian female that I saw in the clinic l in early May 2022   She has a history of diabetes mellitus mellitus atrial fibrillation on anticoagulation who had history of right forefoot pain and had podiatric surgery to the right hallux including MP joint replacement and IP joint resection arthroplasty.   She was evaluated by 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  Labs including sed rate and CRP were normal.  An MRI was not clear-cut as far as revealing pathology radiology thought this could be gout but also could potentially be osteomyelitis epic arthritis involving the IP joint.   She was taken to the operating room by [name].  In the operating room he found significant erosion of the distal portion of the proximal phalanx.  Bone specimens were taken for pathology and cultures were taken as well.   Hardware has been removed. Biopsy showed benign fibroelastic tissue.   Cultures did yield a Staph capitis was sensitive to essentially all antistaphylococcal antibiotics.   [name] called me about the patient and we decided to initiate doxycycline and to get her into our clinic.   I think that despite the fact that the patient's pathology result results were reassuring that she did not have a bone infection that her inflammatory markers are reassuring that we still nonetheless left with radiographic findings suggestive of osteomyelitis and a positive culture with him taking an in a sterile setting from the operating room in an area where infection was suspected.   I felt strongly that minimum we should do is give her 6 weeks of systemic IV antibiotics.   However she had  developed several concerns about the IV antibiotics.   1.  She was  concerned about the cost and was told that it would be $600 out-of-pocket for the 6 weeks of treatment.   .   2.  She was concerned about the PICC line getting infected which is certainly a legitimate concern.   3.  We talked about blood clots though she is on anticoagulation with Coumadin.   4.  She was concerned about being able to administer IV antibiotics to herself without help which I think she could do and most of our patients are capable of doing.     #5 she was concerned about being able to take a shower and being able to wrap the PICC line sufficiently to take sleep safely take a shower.     The end of our conversation we ultimately decided to simply stick with oral doxycycline.  She saw [name] for follow-up.  She continued to do clinically well.  Of note she never had pain as a presenting symptom of her infection.  Instead she had change in the alignment of her toes and the podiatrist then ordered an MRI.  She is sp  8 weeks if not more of doxycycline her inflammatory markers were normal last time I checked them.  She was admitted to [facility] with apparently a psychotic break it seems prior to my last visit with her.   We were observing her off of antibiotics but she then had area of pain in toe that was operated on and she started doxycycline again for what she thought  was an ingrown toenail.  That pain has subsided and she finished 10 days of doxycyline.  She does not have pain in the toe joint itself.  I was concerned upon hearing her phone call re her toe hurting that she might have recurrent infection.   When I saw her in clinic however I was reassured.  She returns to clinic today off antibiotics yet again for repeat check of her condition and inflammatory markers she still has some discomfort with dorsiflexion but this sounds to be more of a muscular tendon issue and she does not have much evidence to suggest deep infection on exam.        Past Medical  History:  Diagnosis Date   Anxiety    Atrial fibrillation ([place])    Bipolar 2 disorder ([place])    Depression    Hematoma 07/06/2020   History of cardioversion    x2   Hyperlipidemia    Hypothyroidism    Ingrown toenail 10/10/2020   Migraine headache    Osteoporosis    Septic arthritis of interphalangeal joint of toe of right foot ([place]) 07/06/2020    Past Surgical History:  Procedure Laterality Date   ABLATION     BACK SURGERY  2014   CARDIOVERSION  12/2013, 01/2014   x2   KNEE SURGERY     ORTHOSCOPIC   LUNG REMOVAL, PARTIAL Right    BENIGN LUNG CYST   SYNOVIAL BIOPSY Right 06/16/2020   Procedure: Right hallux interphalangeal joint arthrotomy and biopsy;  Surgeon: [name], MD;  Location: [facility];  Service: Orthopedics;  Laterality: Right;   THORACIC SPINE SURGERY Right    THUMB ARTHROSCOPY      Family History  Problem Relation Age of Onset   Arthritis Mother    Depression Mother    Diabetes Mother    Heart disease Mother    Stroke Mother    Early death Father    Heart disease Father    Atrial fibrillation Brother    Hyperlipidemia Brother    Multiple sclerosis Sister    Alcohol abuse Brother    Asthma Brother    Drug abuse Brother    Hypertension Brother    Mental illness Brother       Social History   Socioeconomic History   Marital status: Single    Spouse name: Not on file   Number of children: 3   Years of education: Not on file   Highest education level: Not on file  Occupational History   Occupation: Retired  Tobacco Use   Smoking status: Never   Smokeless tobacco: Never  Scientific laboratory technician Use: Not on file  Substance and Sexual Activity   Alcohol use: Not Currently   Drug use: No   Sexual activity: Not Currently  Other Topics Concern   Not on file  Social History Narrative   Not on file   Social Determinants of Health   Financial Resource Strain: Not on file  Food Insecurity: Not on file  Transportation Needs: Not  on file  Physical Activity: Not on file  Stress: Not on file  Social Connections: Not on file    Allergies  Allergen Reactions   Codeine Anaphylaxis    Patient states she can take codeine but not percocet    Adhesive [Tape]     blister   Celebrex [Celecoxib] Hives   Erythromycin Other (See Comments)   Other     Other reaction(s): MAKE HER CRAZY Other  reaction(s): rash, high fever,welts Other reaction(s): rash Other reaction(s): severe diarrhea/vomiting Other reaction(s): Unknown   Penicillamine Diarrhea    Other reaction(s): Vomiting   Sulfa Antibiotics Hives   Tricyclic Antidepressants     Doesn't help   Amoxicillin Rash   Ampicillin Rash   Cephalexin Diarrhea   Macrodantin [Nitrofurantoin] Rash   Penicillins Rash    Did it involve swelling of the face/tongue/throat, SOB, or low BP? Yes Did it involve sudden or severe rash/hives, skin peeling, or any reaction on the inside of your mouth or nose? NO Did you need to seek medical attention at a hospital or doctor's office? NO When did it last happen? childhood       If all above answers are "NO", may proceed with cephalosporin use.     Current Outpatient Medications:    Ascorbic Acid (VITAMIN C) 1000 MG tablet, Take 1,000 mg by mouth daily., Disp: , Rfl:    aspirin 325 MG tablet, Take 325 mg by mouth daily as needed for headache. , Disp: , Rfl:    baclofen (LIORESAL) 10 MG tablet, Take 1 tablet by mouth as needed., Disp: , Rfl:    bisacodyl (DULCOLAX) 5 MG EC tablet, PRN, Disp: , Rfl:    Cholecalciferol (VITAMIN D3) 125 MCG (5000 UT) TABS, Take 1 tablet by mouth daily at 12 noon., Disp: , Rfl:    clotrimazole (LOTRIMIN) 1 % cream, APPLY CREAM TOPICALLY TO AFFECTED AREA TWICE DAILY, Disp: , Rfl:    docusate sodium (COLACE) 100 MG capsule, 1 capsule as needed, Disp: , Rfl:    EPINEPHrine 0.3 mg/0.3 mL IJ SOAJ injection, , Disp: , Rfl:    estradiol (ESTRACE) 1 MG tablet, Take 1 mg by mouth daily., Disp: , Rfl:    ferrous  sulfate 325 (65 FE) MG tablet, Take 325 mg by mouth 2 (two) times daily., Disp: , Rfl:    Galcanezumab-gnlm (EMGALITY) 120 MG/ML SOAJ, Inject into the skin., Disp: , Rfl:    HYDROcodone-acetaminophen (NORCO) 7.5-325 MG tablet, Take 1 tablet by mouth every 8 (eight) hours as needed for moderate pain., Disp: 15 tablet, Rfl: 0   ipratropium (ATROVENT) 0.03 % nasal spray, 1-2 sprays in each nostril, Disp: , Rfl:    levocetirizine (XYZAL) 5 MG tablet, Take 5 mg by mouth every evening., Disp: , Rfl:    levothyroxine (SYNTHROID) 112 MCG tablet, Take 112 mcg by mouth daily before breakfast., Disp: , Rfl:    loperamide (IMODIUM) 2 MG capsule, Take 1 capsule (2 mg total) by mouth as needed for diarrhea or loose stools., Disp: 30 capsule, Rfl: 0   Melatonin 5 MG TABS, Take 20 mg by mouth at bedtime., Disp: , Rfl:    metoprolol succinate (TOPROL-XL) 50 MG 24 hr tablet, Take 1 tablet (50 mg total) by mouth daily. Take with or immediately following a meal. Hold for low blood pressure or heart rate less than 60., Disp: , Rfl: 3   Multiple Vitamin (MULTIVITAMIN WITH MINERALS) TABS tablet, Take 1 tablet by mouth daily., Disp: , Rfl:    naloxone (NARCAN) 0.4 MG/ML injection, , Disp: , Rfl:    ondansetron (ZOFRAN) 4 MG tablet, Take 1 tablet (4 mg total) by mouth every 8 (eight) hours as needed for nausea or vomiting., Disp: 20 tablet, Rfl: 0   pantoprazole (PROTONIX) 40 MG tablet, Take 1 tablet (40 mg total) by mouth daily., Disp: 90 tablet, Rfl: 0   pravastatin (PRAVACHOL) 20 MG tablet, Take 20 mg by mouth daily., Disp: ,  Rfl: 3   pregabalin (LYRICA) 150 MG capsule, Take 1 capsule (150 mg total) by mouth 2 (two) times daily. (Patient taking differently: Take 150 mg by mouth 2 (two) times daily. 3 tabs daily), Disp: , Rfl:    QUEtiapine (SEROQUEL) 200 MG tablet, Take 1 tablet (200 mg total) by mouth at bedtime., Disp: 90 tablet, Rfl: 0   verapamil (VERELAN PM) 120 MG 24 hr capsule, Take 1 capsule (120 mg total) by  mouth at bedtime. Hold for low blood pressures, Disp: , Rfl:    vitamin B-12 1000 MCG tablet, Take 1 tablet (1,000 mcg total) by mouth daily., Disp: , Rfl:    warfarin (COUMADIN) 2.5 MG tablet, Take 1.5 - 2 tablets once daily or AS DIRECTED BY COUMADIN CLINIC, Disp: 150 tablet, Rfl: 0    Review of Systems  Constitutional:  Negative for activity change, appetite change, chills, diaphoresis, fatigue, fever and unexpected weight change.  HENT:  Negative for congestion, rhinorrhea, sinus pressure, sneezing, sore throat and trouble swallowing.   Eyes:  Negative for photophobia and visual disturbance.  Respiratory:  Negative for cough, chest tightness, shortness of breath, wheezing and stridor.   Cardiovascular:  Negative for chest pain, palpitations and leg swelling.  Gastrointestinal:  Negative for abdominal distention, abdominal pain, anal bleeding, blood in stool, constipation, diarrhea, nausea and vomiting.  Genitourinary:  Negative for difficulty urinating, dysuria, flank pain and hematuria.  Musculoskeletal:  Negative for arthralgias, back pain, gait problem, joint swelling and myalgias.  Skin:  Negative for color change, pallor, rash and wound.  Neurological:  Negative for dizziness, tremors, weakness and light-headedness.  Hematological:  Negative for adenopathy. Does not bruise/bleed easily.  Psychiatric/Behavioral:  Negative for agitation, behavioral problems, confusion, decreased concentration, dysphoric mood and sleep disturbance.       Objective:   Physical Exam Constitutional:      General: She is not in acute distress.    Appearance: Normal appearance. She is well-developed. She is not ill-appearing or diaphoretic.  HENT:     Head: Normocephalic and atraumatic.     Right Ear: Hearing and external ear normal.     Left Ear: Hearing and external ear normal.     Nose: Nose normal. No nasal deformity or rhinorrhea.     Mouth/Throat:     Pharynx: No oropharyngeal exudate.  Eyes:      General: No scleral icterus.       Right eye: No discharge.        Left eye: No discharge.     Extraocular Movements: Extraocular movements intact.     Conjunctiva/sclera: Conjunctivae normal.     Right eye: Right conjunctiva is not injected.     Left eye: Left conjunctiva is not injected.     Pupils: Pupils are equal, round, and reactive to light.  Neck:     Vascular: No JVD.  Cardiovascular:     Rate and Rhythm: Normal rate and regular rhythm.     Heart sounds: Normal heart sounds, S1 normal and S2 normal.    No friction rub.  Pulmonary:     Effort: Pulmonary effort is normal. No respiratory distress.     Breath sounds: No wheezing.  Abdominal:     General: There is no distension.     Palpations: Abdomen is soft.     Tenderness: There is no rebound.  Musculoskeletal:        General: No tenderness. Normal range of motion.     Right shoulder: Normal.  Left shoulder: Normal.     Cervical back: Normal range of motion and neck supple.     Right hip: Normal.     Left hip: Normal.     Right knee: Normal.     Left knee: Normal.  Lymphadenopathy:     Head:     Right side of head: No submandibular, preauricular or posterior auricular adenopathy.     Left side of head: No submandibular, preauricular or posterior auricular adenopathy.     Cervical: No cervical adenopathy.     Right cervical: No superficial or deep cervical adenopathy.    Left cervical: No superficial or deep cervical adenopathy.  Skin:    General: Skin is warm and dry.     Coloration: Skin is not jaundiced or pale.     Findings: No abrasion, bruising, ecchymosis, erythema, lesion or rash.     Nails: There is no clubbing.  Neurological:     General: No focal deficit present.     Mental Status: She is alert and oriented to person, place, and time.     Sensory: No sensory deficit.     Coordination: Coordination normal.     Gait: Gait normal.  Psychiatric:        Attention and Perception: She is attentive.         Mood and Affect: Mood normal.        Speech: Speech normal.        Behavior: Behavior normal. Behavior is cooperative.        Thought Content: Thought content normal.        Judgment: Judgment normal.   07/07/2020 visit      08/05/2020:      09/02/2020:     Toe 10/10/2020 where she had ingrown nail:     Right  foot 11/11/2020:         Assessment & Plan:   Septic arthritis of right hallux IP joint with suggestion of osteomyelitis status post greater than 8 weeks of doxycycline  She has not been on antibiotics for an additional 2 months  We will check her sed rate CRP CBC and BMP  Of these were all normal she can return to clinic as needed  History of close C. difficile: This is not reemerged  Paroxysmal atrial fibrillation on Coumadin

## 2020-11-12 LAB — CBC WITH DIFFERENTIAL/PLATELET
Absolute Monocytes: 713 cells/uL (ref 200–950)
Basophils Absolute: 63 cells/uL (ref 0–200)
Basophils Relative: 1.1 %
Eosinophils Absolute: 479 cells/uL (ref 15–500)
Eosinophils Relative: 8.4 %
HCT: 40.2 % (ref 35.0–45.0)
Hemoglobin: 13.2 g/dL (ref 11.7–15.5)
Lymphs Abs: 1448 cells/uL (ref 850–3900)
MCH: 29.7 pg (ref 27.0–33.0)
MCHC: 32.8 g/dL (ref 32.0–36.0)
MCV: 90.3 fL (ref 80.0–100.0)
MPV: 10.4 fL (ref 7.5–12.5)
Monocytes Relative: 12.5 %
Neutro Abs: 2998 cells/uL (ref 1500–7800)
Neutrophils Relative %: 52.6 %
Platelets: 208 10*3/uL (ref 140–400)
RBC: 4.45 10*6/uL (ref 3.80–5.10)
RDW: 13 % (ref 11.0–15.0)
Total Lymphocyte: 25.4 %
WBC: 5.7 10*3/uL (ref 3.8–10.8)

## 2020-11-12 LAB — BASIC METABOLIC PANEL WITH GFR
BUN/Creatinine Ratio: 19 (calc) (ref 6–22)
BUN: 19 mg/dL (ref 7–25)
CO2: 30 mmol/L (ref 20–32)
Calcium: 9.5 mg/dL (ref 8.6–10.4)
Chloride: 104 mmol/L (ref 98–110)
Creat: 1.02 mg/dL — ABNORMAL HIGH (ref 0.60–1.00)
Glucose, Bld: 80 mg/dL (ref 65–99)
Potassium: 4.7 mmol/L (ref 3.5–5.3)
Sodium: 140 mmol/L (ref 135–146)
eGFR: 59 mL/min/{1.73_m2} — ABNORMAL LOW (ref >=60)

## 2020-11-12 LAB — SEDIMENTATION RATE: Sed Rate: 11 mm/h (ref 0–30)

## 2020-11-12 LAB — C-REACTIVE PROTEIN: CRP: 13.8 mg/L — ABNORMAL HIGH

## 2020-11-14 DIAGNOSIS — M7741 Metatarsalgia, right foot: Secondary | ICD-10-CM | POA: Diagnosis not present

## 2020-11-14 DIAGNOSIS — M2041 Other hammer toe(s) (acquired), right foot: Secondary | ICD-10-CM | POA: Diagnosis not present

## 2020-11-15 ENCOUNTER — Telehealth: Payer: Self-pay | Admitting: *Deleted

## 2020-11-15 NOTE — Telephone Encounter (Signed)
   [facility] Pre-operative Risk Assessment    Patient Name: Meredith [name]  DOB: 08-05-1950 MRN: [id number]  HEARTCARE STAFF:  - IMPORTANT!!!!!! Under Visit Info/Reason for Call, type in Other and utilize the format Clearance MM/DD/YY or Clearance TBD. Do not use dashes or single digits. - Please review there is not already an duplicate clearance open for this procedure. - If request is for dental extraction, please clarify the # of teeth to be extracted. - If the patient is currently at the dentist's office, call Pre-Op Callback Staff (MA/nurse) to input urgent request.  - If the patient is not currently in the dentist office, please route to the Pre-Op pool.  Request for surgical clearance:  What type of surgery is being performed?  RT FOOT MT WEIL OSTEOTOMY, H[facility]MERTOE COR  When is this surgery scheduled?  TBD  What type of clearance is required (medical clearance vs. Pharmacy clearance to hold med vs. Both)?  BOTH  Are there any medications that need to be held prior to surgery and how long?  COUMADIN   Practice name and name of physician performing surgery?  [facility]  What is the office phone number?  [phone]   7.   What is the office fax number?  [phone]  8.   Anesthesia type (None, local, MAC, general) ?  CHOICE   Jeanann Lewandowsky 11/15/2020, 2:37 PM  _________________________________________________________________   (provider comments below)'

## 2020-11-16 NOTE — Telephone Encounter (Signed)
Reached out to patient but got VM. LMTCB.

## 2020-11-16 NOTE — Telephone Encounter (Signed)
Patient with diagnosis of afib on warfarin for anticoagulation.    Procedure: RT FOOT MT WEIL OSTEOTOMY, HAMMERTOE COR Date of procedure: TBD  Stroke is listed on PMH, however this was added during 10/22/18 hospitalization for AMS; her acute encephalopathy was actually due to lithium toxicity as her MRI revealed no acute infarct.   CHA2DS2-VASc Score = 3   This indicates a 3.2% annual risk of stroke. The patient's score is based upon: CHF History: 0 HTN History: 0 Diabetes History: 1 Stroke History: 0 Vascular Disease History: 0 Age Score: 1 Gender Score: 1     Per office protocol, patient can hold warfarin for 5 days prior to procedure.    Patient will NOT need bridging with Lovenox (enoxaparin) around procedure.

## 2020-11-16 NOTE — Telephone Encounter (Signed)
Covering preop today. Last OV 02/2020 reviewed, cleared to proceed with unrelated procedure at that time. Will route to pharm then patient will need call.

## 2020-11-18 NOTE — Telephone Encounter (Signed)
Patient returning call.

## 2020-11-21 NOTE — Telephone Encounter (Addendum)
I returned patient's call, but got VM. LMTCB again. Of note, RCRI 0.4%. Returned call again but always goes to VM. LM to call back and provide alternative number please.

## 2020-11-22 NOTE — Telephone Encounter (Signed)
Patient returning a call from Pre-op

## 2020-11-23 ENCOUNTER — Ambulatory Visit (INDEPENDENT_AMBULATORY_CARE_PROVIDER_SITE_OTHER): Payer: Medicare Other | Admitting: Psychology

## 2020-11-23 ENCOUNTER — Other Ambulatory Visit (HOSPITAL_COMMUNITY): Payer: Self-pay | Admitting: Orthopedic Surgery

## 2020-11-23 DIAGNOSIS — F3181 Bipolar II disorder: Secondary | ICD-10-CM

## 2020-11-23 NOTE — Telephone Encounter (Signed)
Follow up:     Patient calling back to check the status of her last message. Message never routed.

## 2020-11-23 NOTE — Telephone Encounter (Signed)
   Name: Meredith [name]  DOB: August 10, 1950  MRN: [id number]   Primary Cardiologist: Mertie Moores, MD  Chart reviewed as part of pre-operative protocol coverage. Patient was contacted 11/23/2020 in reference to pre-operative risk assessment for pending surgery as outlined below.  Meredith [name] was last seen on 02/29/20 by Dr. Acie Fredrickson.  Since that day, Meredith [name] has done well from a cardiac standpoint.  She can easily complete 4 METS without anginal complaints.  Therefore, based on ACC/AHA guidelines, the patient would be at acceptable risk for the planned procedure without further cardiovascular testing.   The patient was advised that if she develops new symptoms prior to surgery to contact our office to arrange for a follow-up visit, and she verbalized understanding.  Per pharmacy recommendations, patient can hold coumadin 5 days prior to her upcoming foot surgery with plans to restart as soon as she is cleared to do so by her surgeon.  I will route this recommendation to the requesting party via Epic fax function and remove from pre-op pool. Please call with questions.  Abigail Butts, PA-C 11/23/2020, 2:15 PM

## 2020-11-24 ENCOUNTER — Other Ambulatory Visit: Payer: Self-pay | Admitting: Cardiovascular Disease

## 2020-11-24 ENCOUNTER — Other Ambulatory Visit: Payer: Self-pay

## 2020-11-24 ENCOUNTER — Encounter: Payer: Self-pay | Admitting: Psychiatry

## 2020-11-24 ENCOUNTER — Ambulatory Visit (INDEPENDENT_AMBULATORY_CARE_PROVIDER_SITE_OTHER): Payer: Medicare Other | Admitting: Psychiatry

## 2020-11-24 DIAGNOSIS — F4310 Post-traumatic stress disorder, unspecified: Secondary | ICD-10-CM

## 2020-11-24 DIAGNOSIS — G3184 Mild cognitive impairment, so stated: Secondary | ICD-10-CM

## 2020-11-24 DIAGNOSIS — F5105 Insomnia due to other mental disorder: Secondary | ICD-10-CM

## 2020-11-24 DIAGNOSIS — F3181 Bipolar II disorder: Secondary | ICD-10-CM | POA: Diagnosis not present

## 2020-11-24 DIAGNOSIS — F411 Generalized anxiety disorder: Secondary | ICD-10-CM | POA: Diagnosis not present

## 2020-11-24 DIAGNOSIS — F4001 Agoraphobia with panic disorder: Secondary | ICD-10-CM

## 2020-11-24 MED ORDER — ARIPIPRAZOLE 20 MG PO TABS: ORAL_TABLET | ORAL | 1 refills | Status: DC

## 2020-11-24 NOTE — Progress Notes (Signed)
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 depression or mania or mixed episode.  However we will not restart any medications to prevent that at this time given the uncertainty of the cause of the encephalopathy.  Overall more stressed than depressed over her health situation.  Using pillbox to help compliance.  Disc danger of mixing up or doubling up meds.  She fills box herself.  Rec she get help with this. Cannot afford branded meds which prevents [place] from using some of the bipolar depression meds like Latuda, Vraylar.  continue quetiapine to 200 mg at bedtime  She might have unrealistic expectations to sleep 9 hours.  Disc tolerance.  Push fluids.  Has to remind herself..  Disc ways to talk to brother about helping her.   Discussed potential metabolic side effects associated with atypical antipsychotics, as well as potential risk for movement side effects. Advised pt to contact office if movement side effects occur.   Restart Abilify for rapid cycling mixed bipolar 10 mg for 1 week then 20 mg daily. Continue Seroquel 200 mg HS. This has not been suffiecient  to maintan mood  Using pill box to keep up with meds.  OK Ativan prn for dental procedure 1-2 mg.  No driving after it.  PCP Elyn Peers, Genelle Bal  Continue therapy with Dr. Cheryln Manly q 2 weeks. Consider better medication coverage when gets new insurance  Bring meds to office.  Follow-up 2 mos  Lynder Parents MD, DFAPA  Please see After Visit Summary for patient specific instructions.  Future Appointments  Date Time Provider [place]  11/29/2020  3:45 PM [facility]  12/07/2020  1:00 PM Oren Binet, PhD [facility] None  12/21/2020  1:00 PM Oren Binet, PhD [facility] None  03/03/2021  3:00 PM Werner Lean, MD [facility]    No orders of the defined  types were placed in this encounter.     -------------------------------

## 2020-11-29 ENCOUNTER — Ambulatory Visit (INDEPENDENT_AMBULATORY_CARE_PROVIDER_SITE_OTHER): Payer: Medicare Other

## 2020-11-29 ENCOUNTER — Other Ambulatory Visit: Payer: Self-pay

## 2020-11-29 DIAGNOSIS — E782 Mixed hyperlipidemia: Secondary | ICD-10-CM | POA: Diagnosis not present

## 2020-11-29 DIAGNOSIS — I480 Paroxysmal atrial fibrillation: Secondary | ICD-10-CM | POA: Diagnosis not present

## 2020-11-29 DIAGNOSIS — E039 Hypothyroidism, unspecified: Secondary | ICD-10-CM | POA: Diagnosis not present

## 2020-11-29 DIAGNOSIS — K219 Gastro-esophageal reflux disease without esophagitis: Secondary | ICD-10-CM | POA: Diagnosis not present

## 2020-11-29 DIAGNOSIS — Z5181 Encounter for therapeutic drug level monitoring: Secondary | ICD-10-CM

## 2020-11-29 DIAGNOSIS — F3181 Bipolar II disorder: Secondary | ICD-10-CM | POA: Diagnosis not present

## 2020-11-29 DIAGNOSIS — M810 Age-related osteoporosis without current pathological fracture: Secondary | ICD-10-CM | POA: Diagnosis not present

## 2020-11-29 DIAGNOSIS — F323 Major depressive disorder, single episode, severe with psychotic features: Secondary | ICD-10-CM | POA: Diagnosis not present

## 2020-11-29 DIAGNOSIS — D649 Anemia, unspecified: Secondary | ICD-10-CM | POA: Diagnosis not present

## 2020-11-29 DIAGNOSIS — G43009 Migraine without aura, not intractable, without status migrainosus: Secondary | ICD-10-CM | POA: Diagnosis not present

## 2020-11-29 DIAGNOSIS — M8580 Other specified disorders of bone density and structure, unspecified site: Secondary | ICD-10-CM | POA: Diagnosis not present

## 2020-11-29 DIAGNOSIS — E7800 Pure hypercholesterolemia, unspecified: Secondary | ICD-10-CM | POA: Diagnosis not present

## 2020-11-29 DIAGNOSIS — N183 Chronic kidney disease, stage 3 unspecified: Secondary | ICD-10-CM | POA: Diagnosis not present

## 2020-11-29 LAB — POCT INR: INR: 1.9 — AB (ref 2.0–3.0)

## 2020-11-29 NOTE — Patient Instructions (Signed)
Description   Take an extra 1/2 tablet today, then resume same dosage of Warfarin 1.5 tablets everyday.  Take your last dosage of Warfarin on 12/09/20 prior to surgery on 12/15/20.  Resume Warfarin day of surgery if surgeon states of to do so, take 2 tablets x 2 dosages, then resume same dosage 1.5 tablets of Warfarin daily.   Recheck INR in 1 week after surgery. Call with any medication changes or if you are scheduled for surgery [facility] [phone] Main [phone]

## 2020-12-05 DIAGNOSIS — M542 Cervicalgia: Secondary | ICD-10-CM | POA: Diagnosis not present

## 2020-12-05 DIAGNOSIS — M791 Myalgia, unspecified site: Secondary | ICD-10-CM | POA: Diagnosis not present

## 2020-12-05 DIAGNOSIS — G518 Other disorders of facial nerve: Secondary | ICD-10-CM | POA: Diagnosis not present

## 2020-12-05 DIAGNOSIS — G43719 Chronic migraine without aura, intractable, without status migrainosus: Secondary | ICD-10-CM | POA: Diagnosis not present

## 2020-12-07 ENCOUNTER — Ambulatory Visit (INDEPENDENT_AMBULATORY_CARE_PROVIDER_SITE_OTHER): Payer: Medicare Other | Admitting: Psychology

## 2020-12-07 DIAGNOSIS — F3181 Bipolar II disorder: Secondary | ICD-10-CM | POA: Diagnosis not present

## 2020-12-08 ENCOUNTER — Encounter (HOSPITAL_BASED_OUTPATIENT_CLINIC_OR_DEPARTMENT_OTHER): Payer: Self-pay | Admitting: Orthopedic Surgery

## 2020-12-08 ENCOUNTER — Other Ambulatory Visit: Payer: Self-pay

## 2020-12-11 DIAGNOSIS — L243 Irritant contact dermatitis due to cosmetics: Secondary | ICD-10-CM | POA: Diagnosis not present

## 2020-12-12 ENCOUNTER — Encounter (HOSPITAL_BASED_OUTPATIENT_CLINIC_OR_DEPARTMENT_OTHER)
Admission: RE | Admit: 2020-12-12 | Discharge: 2020-12-12 | Disposition: A | Discharge status: Home / Self Care | Payer: Medicare Other | Source: Ambulatory Visit | Attending: Orthopedic Surgery | Admitting: Orthopedic Surgery

## 2020-12-12 DIAGNOSIS — Z885 Allergy status to narcotic agent status: Secondary | ICD-10-CM | POA: Diagnosis not present

## 2020-12-12 DIAGNOSIS — Z01812 Encounter for preprocedural laboratory examination: Secondary | ICD-10-CM | POA: Diagnosis not present

## 2020-12-12 DIAGNOSIS — Z7901 Long term (current) use of anticoagulants: Secondary | ICD-10-CM | POA: Diagnosis not present

## 2020-12-12 DIAGNOSIS — E1122 Type 2 diabetes mellitus with diabetic chronic kidney disease: Secondary | ICD-10-CM | POA: Diagnosis not present

## 2020-12-12 DIAGNOSIS — Z881 Allergy status to other antibiotic agents status: Secondary | ICD-10-CM | POA: Diagnosis not present

## 2020-12-12 DIAGNOSIS — Z79899 Other long term (current) drug therapy: Secondary | ICD-10-CM | POA: Diagnosis not present

## 2020-12-12 DIAGNOSIS — Z888 Allergy status to other drugs, medicaments and biological substances status: Secondary | ICD-10-CM | POA: Diagnosis not present

## 2020-12-12 DIAGNOSIS — M2041 Other hammer toe(s) (acquired), right foot: Secondary | ICD-10-CM | POA: Diagnosis not present

## 2020-12-12 DIAGNOSIS — M7741 Metatarsalgia, right foot: Secondary | ICD-10-CM | POA: Diagnosis not present

## 2020-12-12 DIAGNOSIS — Z886 Allergy status to analgesic agent status: Secondary | ICD-10-CM | POA: Diagnosis not present

## 2020-12-12 DIAGNOSIS — N189 Chronic kidney disease, unspecified: Secondary | ICD-10-CM | POA: Diagnosis not present

## 2020-12-12 DIAGNOSIS — Z882 Allergy status to sulfonamides status: Secondary | ICD-10-CM | POA: Diagnosis not present

## 2020-12-12 DIAGNOSIS — Z87892 Personal history of anaphylaxis: Secondary | ICD-10-CM | POA: Diagnosis not present

## 2020-12-12 DIAGNOSIS — Z79890 Hormone replacement therapy (postmenopausal): Secondary | ICD-10-CM | POA: Diagnosis not present

## 2020-12-12 DIAGNOSIS — Z880 Allergy status to penicillin: Secondary | ICD-10-CM | POA: Diagnosis not present

## 2020-12-12 LAB — BASIC METABOLIC PANEL
Anion gap: 7 (ref 5–15)
BUN: 14 mg/dL (ref 8–23)
CO2: 26 mmol/L (ref 22–32)
Calcium: 9.5 mg/dL (ref 8.9–10.3)
Chloride: 107 mmol/L (ref 98–111)
Creatinine, Ser: 0.99 mg/dL (ref 0.44–1.00)
GFR, Estimated: >60 mL/min (ref >60)
Glucose, Bld: 94 mg/dL (ref 70–99)
Potassium: 4.5 mmol/L (ref 3.5–5.1)
Sodium: 140 mmol/L (ref 135–145)

## 2020-12-14 DIAGNOSIS — M79673 Pain in unspecified foot: Secondary | ICD-10-CM | POA: Diagnosis not present

## 2020-12-14 DIAGNOSIS — M7918 Myalgia, other site: Secondary | ICD-10-CM | POA: Diagnosis not present

## 2020-12-14 DIAGNOSIS — M25559 Pain in unspecified hip: Secondary | ICD-10-CM | POA: Diagnosis not present

## 2020-12-14 DIAGNOSIS — G894 Chronic pain syndrome: Secondary | ICD-10-CM | POA: Diagnosis not present

## 2020-12-15 ENCOUNTER — Other Ambulatory Visit: Payer: Self-pay

## 2020-12-15 ENCOUNTER — Ambulatory Visit (HOSPITAL_BASED_OUTPATIENT_CLINIC_OR_DEPARTMENT_OTHER): Payer: Medicare Other | Admitting: Anesthesiology

## 2020-12-15 ENCOUNTER — Ambulatory Visit (HOSPITAL_BASED_OUTPATIENT_CLINIC_OR_DEPARTMENT_OTHER)
Admission: RE | Admit: 2020-12-15 | Discharge: 2020-12-15 | Disposition: A | Discharge status: Home / Self Care | Payer: Medicare Other | Attending: Orthopedic Surgery | Admitting: Orthopedic Surgery

## 2020-12-15 ENCOUNTER — Encounter (HOSPITAL_BASED_OUTPATIENT_CLINIC_OR_DEPARTMENT_OTHER)
Admission: RE | Disposition: A | Discharge status: Home / Self Care | Payer: Self-pay | Source: Home / Self Care | Attending: Orthopedic Surgery

## 2020-12-15 ENCOUNTER — Encounter (HOSPITAL_BASED_OUTPATIENT_CLINIC_OR_DEPARTMENT_OTHER): Payer: Self-pay | Admitting: Orthopedic Surgery

## 2020-12-15 ENCOUNTER — Ambulatory Visit (HOSPITAL_BASED_OUTPATIENT_CLINIC_OR_DEPARTMENT_OTHER): Payer: Medicare Other

## 2020-12-15 DIAGNOSIS — Z7901 Long term (current) use of anticoagulants: Secondary | ICD-10-CM | POA: Diagnosis not present

## 2020-12-15 DIAGNOSIS — M2041 Other hammer toe(s) (acquired), right foot: Secondary | ICD-10-CM | POA: Insufficient documentation

## 2020-12-15 DIAGNOSIS — Z882 Allergy status to sulfonamides status: Secondary | ICD-10-CM | POA: Insufficient documentation

## 2020-12-15 DIAGNOSIS — Z885 Allergy status to narcotic agent status: Secondary | ICD-10-CM | POA: Diagnosis not present

## 2020-12-15 DIAGNOSIS — Z888 Allergy status to other drugs, medicaments and biological substances status: Secondary | ICD-10-CM | POA: Insufficient documentation

## 2020-12-15 DIAGNOSIS — I480 Paroxysmal atrial fibrillation: Secondary | ICD-10-CM | POA: Diagnosis not present

## 2020-12-15 DIAGNOSIS — Z87892 Personal history of anaphylaxis: Secondary | ICD-10-CM | POA: Diagnosis not present

## 2020-12-15 DIAGNOSIS — Z79899 Other long term (current) drug therapy: Secondary | ICD-10-CM | POA: Diagnosis not present

## 2020-12-15 DIAGNOSIS — M7741 Metatarsalgia, right foot: Secondary | ICD-10-CM | POA: Insufficient documentation

## 2020-12-15 DIAGNOSIS — G8918 Other acute postprocedural pain: Secondary | ICD-10-CM | POA: Diagnosis not present

## 2020-12-15 DIAGNOSIS — Z881 Allergy status to other antibiotic agents status: Secondary | ICD-10-CM | POA: Diagnosis not present

## 2020-12-15 DIAGNOSIS — E1122 Type 2 diabetes mellitus with diabetic chronic kidney disease: Secondary | ICD-10-CM | POA: Insufficient documentation

## 2020-12-15 DIAGNOSIS — Z79890 Hormone replacement therapy (postmenopausal): Secondary | ICD-10-CM | POA: Diagnosis not present

## 2020-12-15 DIAGNOSIS — N189 Chronic kidney disease, unspecified: Secondary | ICD-10-CM | POA: Diagnosis not present

## 2020-12-15 DIAGNOSIS — M2031 Hallux varus (acquired), right foot: Secondary | ICD-10-CM

## 2020-12-15 DIAGNOSIS — Z880 Allergy status to penicillin: Secondary | ICD-10-CM | POA: Diagnosis not present

## 2020-12-15 DIAGNOSIS — Z886 Allergy status to analgesic agent status: Secondary | ICD-10-CM | POA: Insufficient documentation

## 2020-12-15 HISTORY — DX: Gastro-esophageal reflux disease without esophagitis: K21.9

## 2020-12-15 HISTORY — DX: Chronic kidney disease, unspecified: N18.9

## 2020-12-15 HISTORY — PX: HAMMERTOE RECONSTRUCTION WITH WEIL OSTEOTOMY: SHX5631

## 2020-12-15 HISTORY — DX: Prediabetes: R73.03

## 2020-12-15 SURGERY — HAMMERTOE RECONSTRUCTION WITH WEIL OSTEOTOMY
Anesthesia: Regional | Site: Foot | Laterality: Right

## 2020-12-15 MED ORDER — ONDANSETRON HCL 4 MG/2ML IJ SOLN
INTRAMUSCULAR | Status: DC | PRN
  Administered 2020-12-15: 4 mg via INTRAVENOUS

## 2020-12-15 MED ORDER — FENTANYL CITRATE (PF) 100 MCG/2ML IJ SOLN
INTRAMUSCULAR | Status: AC
  Filled 2020-12-15: qty 2

## 2020-12-15 MED ORDER — EPHEDRINE SULFATE 50 MG/ML IJ SOLN
INTRAMUSCULAR | Status: DC | PRN
  Administered 2020-12-15 (×4): 10 mg via INTRAVENOUS

## 2020-12-15 MED ORDER — CLONIDINE HCL (ANALGESIA) 100 MCG/ML EP SOLN
EPIDURAL | Status: DC | PRN
  Administered 2020-12-15: 50 ug

## 2020-12-15 MED ORDER — FENTANYL CITRATE (PF) 100 MCG/2ML IJ SOLN
50.0000 ug | Freq: Once | INTRAMUSCULAR | Status: AC
  Administered 2020-12-15: 50 ug via INTRAVENOUS

## 2020-12-15 MED ORDER — HYDROMORPHONE HCL 2 MG PO TABS: 2.0000 mg | ORAL_TABLET | Freq: Four times a day (QID) | ORAL | 0 refills | Status: AC | PRN

## 2020-12-15 MED ORDER — SODIUM CHLORIDE 0.9 % IV SOLN: INTRAVENOUS | Status: DC

## 2020-12-15 MED ORDER — LIDOCAINE 2% (20 MG/ML) 5 ML SYRINGE
INTRAMUSCULAR | Status: DC | PRN
  Administered 2020-12-15: 30 mg via INTRAVENOUS

## 2020-12-15 MED ORDER — LACTATED RINGERS IV SOLN: INTRAVENOUS | Status: DC

## 2020-12-15 MED ORDER — CEFAZOLIN SODIUM-DEXTROSE 2-4 GM/100ML-% IV SOLN
INTRAVENOUS | Status: AC
  Filled 2020-12-15: qty 100

## 2020-12-15 MED ORDER — CEFAZOLIN SODIUM-DEXTROSE 2-4 GM/100ML-% IV SOLN
2.0000 g | INTRAVENOUS | Status: AC
  Administered 2020-12-15: 2 g via INTRAVENOUS

## 2020-12-15 MED ORDER — PROPOFOL 10 MG/ML IV BOLUS
INTRAVENOUS | Status: DC | PRN
  Administered 2020-12-15: 150 mg via INTRAVENOUS

## 2020-12-15 MED ORDER — PROPOFOL 500 MG/50ML IV EMUL
INTRAVENOUS | Status: DC | PRN
  Administered 2020-12-15: 25 ug/kg/min via INTRAVENOUS

## 2020-12-15 MED ORDER — VANCOMYCIN HCL 500 MG IV SOLR
INTRAVENOUS | Status: AC
  Filled 2020-12-15: qty 10

## 2020-12-15 MED ORDER — MIDAZOLAM HCL 2 MG/2ML IJ SOLN
1.0000 mg | Freq: Once | INTRAMUSCULAR | Status: AC
  Administered 2020-12-15: 1 mg via INTRAVENOUS

## 2020-12-15 MED ORDER — ROPIVACAINE HCL 5 MG/ML IJ SOLN
INTRAMUSCULAR | Status: DC | PRN
  Administered 2020-12-15: 25 mL via PERINEURAL

## 2020-12-15 MED ORDER — 0.9 % SODIUM CHLORIDE (POUR BTL) OPTIME
TOPICAL | Status: DC | PRN
  Administered 2020-12-15: 200 mL

## 2020-12-15 MED ORDER — DEXAMETHASONE SODIUM PHOSPHATE 10 MG/ML IJ SOLN
INTRAMUSCULAR | Status: DC | PRN
  Administered 2020-12-15: 4 mg via INTRAVENOUS

## 2020-12-15 MED ORDER — MIDAZOLAM HCL 2 MG/2ML IJ SOLN
INTRAMUSCULAR | Status: AC
  Filled 2020-12-15: qty 2

## 2020-12-15 MED ORDER — VANCOMYCIN HCL 500 MG IV SOLR
INTRAVENOUS | Status: DC | PRN
  Administered 2020-12-15: 500 mg via TOPICAL

## 2020-12-15 SURGICAL SUPPLY — 72 items
APL PRP STRL LF DISP 70% ISPRP (MISCELLANEOUS) ×1
BANDAGE ESMARK 6X9 LF (GAUZE/BANDAGES/DRESSINGS) IMPLANT
BIT DRILL CANN 2.4 (BIT) ×2
BIT DRILL CANN MAX VPC 2.4 (BIT) ×1 IMPLANT
BLADE AVERAGE 25X9 (BLADE) IMPLANT
BLADE LONG MED 25X9 (BLADE) ×2 IMPLANT
BLADE OSC/SAG .038X5.5 CUT EDG (BLADE) IMPLANT
BLADE SURG 15 STRL LF DISP TIS (BLADE) ×2 IMPLANT
BLADE SURG 15 STRL SS (BLADE) ×4
BNDG CMPR 9X4 STRL LF SNTH (GAUZE/BANDAGES/DRESSINGS)
BNDG CMPR 9X6 STRL LF SNTH (GAUZE/BANDAGES/DRESSINGS)
BNDG COHESIVE 4X5 TAN ST LF (GAUZE/BANDAGES/DRESSINGS) ×2 IMPLANT
BNDG CONFORM 2 STRL LF (GAUZE/BANDAGES/DRESSINGS) IMPLANT
BNDG CONFORM 3 STRL LF (GAUZE/BANDAGES/DRESSINGS) ×2 IMPLANT
BNDG ESMARK 4X9 LF (GAUZE/BANDAGES/DRESSINGS) IMPLANT
BNDG ESMARK 6X9 LF (GAUZE/BANDAGES/DRESSINGS)
CAP PIN PROTECTOR ORTHO WHT (CAP) IMPLANT
CHLORAPREP W/TINT 26 (MISCELLANEOUS) ×2 IMPLANT
COVER BACK TABLE 60X90IN (DRAPES) ×2 IMPLANT
CUFF TOURN SGL QUICK 34 (TOURNIQUET CUFF)
CUFF TRNQT CYL 34X4.125X (TOURNIQUET CUFF) IMPLANT
DRAPE EXTREMITY T 121X128X90 (DISPOSABLE) ×2 IMPLANT
DRAPE OEC MINIVIEW 54X84 (DRAPES) ×2 IMPLANT
DRAPE U-SHAPE 47X51 STRL (DRAPES) ×2 IMPLANT
DRSG MEPITEL 4X7.2 (GAUZE/BANDAGES/DRESSINGS) ×2 IMPLANT
DRSG PAD ABDOMINAL 8X10 ST (GAUZE/BANDAGES/DRESSINGS) ×2 IMPLANT
ELECT REM PT RETURN 9FT ADLT (ELECTROSURGICAL) ×2
ELECTRODE REM PT RTRN 9FT ADLT (ELECTROSURGICAL) ×1 IMPLANT
GAUZE SPONGE 4X4 12PLY STRL (GAUZE/BANDAGES/DRESSINGS) ×2 IMPLANT
GLOVE SRG 8 PF TXTR STRL LF DI (GLOVE) ×2 IMPLANT
GLOVE SURG ENC MOIS LTX SZ8 (GLOVE) ×2 IMPLANT
GLOVE SURG LTX SZ8 (GLOVE) ×2 IMPLANT
GLOVE SURG POLYISO LF SZ7 (GLOVE) ×2 IMPLANT
GLOVE SURG UNDER POLY LF SZ7 (GLOVE) ×4 IMPLANT
GLOVE SURG UNDER POLY LF SZ8 (GLOVE) ×4
GOWN STRL REUS W/ TWL LRG LVL3 (GOWN DISPOSABLE) ×1 IMPLANT
GOWN STRL REUS W/ TWL XL LVL3 (GOWN DISPOSABLE) ×2 IMPLANT
GOWN STRL REUS W/TWL LRG LVL3 (GOWN DISPOSABLE) ×2
GOWN STRL REUS W/TWL XL LVL3 (GOWN DISPOSABLE) ×4
K-WIRE .054X4 (WIRE) IMPLANT
K-WIRE COCR 1.1X105 (WIRE) ×2
KWIRE COCR 1.1X105 (WIRE) ×1 IMPLANT
NEEDLE HYPO 22GX1.5 SAFETY (NEEDLE) IMPLANT
NS IRRIG 1000ML POUR BTL (IV SOLUTION) ×2 IMPLANT
PACK BASIN DAY SURGERY FS (CUSTOM PROCEDURE TRAY) ×2 IMPLANT
PAD CAST 4YDX4 CTTN HI CHSV (CAST SUPPLIES) ×1 IMPLANT
PADDING CAST ABS 4INX4YD NS (CAST SUPPLIES)
PADDING CAST ABS COTTON 4X4 ST (CAST SUPPLIES) IMPLANT
PADDING CAST COTTON 4X4 STRL (CAST SUPPLIES) ×2
PASSER SUT SWANSON 36MM LOOP (INSTRUMENTS) IMPLANT
PENCIL SMOKE EVACUATOR (MISCELLANEOUS) ×2 IMPLANT
SANITIZER HAND PURELL 535ML FO (MISCELLANEOUS) ×2 IMPLANT
SCREW HCS TWIST-OFF 2.0X12MM (Screw) ×2 IMPLANT
SCREW VPC 3.4X16 (Screw) ×2 IMPLANT
SHEET MEDIUM DRAPE 40X70 STRL (DRAPES) ×2 IMPLANT
SLEEVE SCD COMPRESS KNEE MED (STOCKING) ×2 IMPLANT
SPONGE T-LAP 18X18 ~~LOC~~+RFID (SPONGE) ×2 IMPLANT
STOCKINETTE 6  STRL (DRAPES) ×1
STOCKINETTE 6 STRL (DRAPES) ×1 IMPLANT
SUCTION FRAZIER HANDLE 10FR (MISCELLANEOUS) ×1
SUCTION TUBE FRAZIER 10FR DISP (MISCELLANEOUS) ×1 IMPLANT
SUT ETHILON 3 0 PS 1 (SUTURE) ×2 IMPLANT
SUT MNCRL AB 3-0 PS2 18 (SUTURE) ×2 IMPLANT
SUT VIC AB 2-0 SH 27 (SUTURE) ×2
SUT VIC AB 2-0 SH 27XBRD (SUTURE) ×1 IMPLANT
SUT VICRYL 0 UR6 27IN ABS (SUTURE) IMPLANT
SYR BULB EAR ULCER 3OZ GRN STR (SYRINGE) ×2 IMPLANT
SYR CONTROL 10ML LL (SYRINGE) IMPLANT
TOWEL GREEN STERILE FF (TOWEL DISPOSABLE) ×2 IMPLANT
TUBE CONNECTING 20X1/4 (TUBING) ×2 IMPLANT
UNDERPAD 30X36 HEAVY ABSORB (UNDERPADS AND DIAPERS) ×2 IMPLANT
YANKAUER SUCT BULB TIP NO VENT (SUCTIONS) IMPLANT

## 2020-12-15 NOTE — H&P (Signed)
Meredith [name] is an 70 y.o. female.   Chief Complaint: Right foot pain HPI: 70 year old female with past medical history significant for prediabetes and atrial fibrillation has a long history of right forefoot pain due to a prominent second hammertoe.  She appears to have cleared her hallux infection.  She presents now for operative treatment of the second hammertoe.  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   Osteoporosis    Pre-diabetes    Septic arthritis of interphalangeal joint of toe of right foot ([place]) 07/06/2020    Past Surgical History:  Procedure Laterality Date   ABLATION     BACK SURGERY  2014   CARDIOVERSION  12/2013, 01/2014   x2   KNEE SURGERY     ORTHOSCOPIC   LUNG REMOVAL, PARTIAL Right    BENIGN LUNG CYST   SYNOVIAL BIOPSY Right 06/16/2020   Procedure: Right hallux interphalangeal joint arthrotomy and biopsy;  Surgeon: [name], MD;  Location: [place];  Service: Orthopedics;  Laterality: Right;   THORACIC SPINE SURGERY Right    THUMB ARTHROSCOPY      Family History  Problem Relation Age of Onset   Arthritis Mother    Depression Mother    Diabetes Mother    Heart disease Mother    Stroke Mother    Early death Father    Heart disease Father    Atrial fibrillation Brother    Hyperlipidemia Brother    Multiple sclerosis Sister    Alcohol abuse Brother    Asthma Brother    Drug abuse Brother    Hypertension Brother    Mental illness Brother    Social History:  reports that she has never smoked. She has never used smokeless tobacco. She  reports that she does not currently use alcohol. She reports that she does not use drugs.  Allergies:  Allergies  Allergen Reactions   Codeine Anaphylaxis    Patient states she can take codeine but not percocet    Adhesive [Tape]     blister   Celebrex [Celecoxib] Hives   Erythromycin Other (See Comments)   Other     Other reaction(s): MAKE HER CRAZY Other reaction(s): rash, high fever,welts Other reaction(s): rash Other reaction(s): severe diarrhea/vomiting Other reaction(s): Unknown   Penicillamine Diarrhea    Other reaction(s): Vomiting   Sulfa Antibiotics Hives   Tricyclic Antidepressants     Doesn't help   Amoxicillin Rash   Ampicillin Rash   Cephalexin Diarrhea   Macrodantin [Nitrofurantoin] Rash   Penicillins Rash    Did it involve swelling of the face/tongue/throat, SOB, or low BP? Yes Did it involve sudden or severe rash/hives, skin peeling, or any reaction on the inside of your mouth or nose? NO Did you need to seek medical attention at a hospital or doctor's office? NO When did it last happen? childhood       If all above answers are "NO", may proceed with cephalosporin use.    Medications Prior to Admission  Medication  Sig Dispense Refill   ARIPiprazole (ABILIFY) 20 MG tablet 1/2 tablet in AM for 1 week then 1 tablet each AM 30 tablet 1   Ascorbic Acid (VITAMIN C) 1000 MG tablet Take 1,000 mg by mouth daily.     aspirin 325 MG tablet Take 325 mg by mouth daily as needed for headache.      baclofen (LIORESAL) 10 MG tablet Take 1 tablet by mouth as needed.     bisacodyl (DULCOLAX) 5 MG EC tablet PRN     Cholecalciferol (VITAMIN D3) 125 MCG (5000 UT) TABS Take 1 tablet by mouth daily at 12 noon.     clotrimazole (LOTRIMIN) 1 % cream APPLY CREAM TOPICALLY TO AFFECTED AREA TWICE DAILY     estradiol (ESTRACE) 1 MG tablet Take 1 mg by mouth daily.     ferrous sulfate 325 (65 FE) MG tablet Take 325 mg by mouth 2 (two) times daily.     Galcanezumab-gnlm (EMGALITY) 120  MG/ML SOAJ Inject into the skin.     HYDROcodone-acetaminophen (NORCO) 7.5-325 MG tablet Take 1 tablet by mouth every 8 (eight) hours as needed for moderate pain. 15 tablet 0   ipratropium (ATROVENT) 0.03 % nasal spray 1-2 sprays in each nostril     levocetirizine (XYZAL) 5 MG tablet Take 5 mg by mouth every evening.     levothyroxine (SYNTHROID) 112 MCG tablet Take 112 mcg by mouth daily before breakfast.     loperamide (IMODIUM) 2 MG capsule Take 1 capsule (2 mg total) by mouth as needed for diarrhea or loose stools. 30 capsule 0   Melatonin 5 MG TABS Take 20 mg by mouth at bedtime.     metoprolol succinate (TOPROL-XL) 50 MG 24 hr tablet Take 1 tablet (50 mg total) by mouth daily. Take with or immediately following a meal. Hold for low blood pressure or heart rate less than 60.  3   Multiple Vitamin (MULTIVITAMIN WITH MINERALS) TABS tablet Take 1 tablet by mouth daily.     ondansetron (ZOFRAN) 4 MG tablet Take 1 tablet (4 mg total) by mouth every 8 (eight) hours as needed for nausea or vomiting. 20 tablet 0   pantoprazole (PROTONIX) 40 MG tablet Take 1 tablet (40 mg total) by mouth daily. 90 tablet 0   pravastatin (PRAVACHOL) 20 MG tablet Take 20 mg by mouth daily.  3   pregabalin (LYRICA) 150 MG capsule Take 1 capsule (150 mg total) by mouth 2 (two) times daily. (Patient taking differently: Take 150 mg by mouth 2 (two) times daily. 3 tabs daily)     QUEtiapine (SEROQUEL) 200 MG tablet Take 1 tablet (200 mg total) by mouth at bedtime. 90 tablet 0   verapamil (VERELAN PM) 120 MG 24 hr capsule Take 1 capsule (120 mg total) by mouth at bedtime. Hold for low blood pressures     vitamin B-12 1000 MCG tablet Take 1 tablet (1,000 mcg total) by mouth daily.     warfarin (COUMADIN) 2.5 MG tablet TAKE 1 & 1/2 (ONE & ONE-HALF) TABLETS BY MOUTH ONCE DAILY OR  AS  DIRECTED  BY  COUMADIN  CLINIC 150 tablet 1   docusate sodium (COLACE) 100 MG capsule Take 100 mg by mouth 2 (two) times daily.     EPINEPHrine 0.3  mg/0.3 mL IJ SOAJ injection      naloxone (NARCAN) 0.4 MG/ML injection       No results found for this or any previous visit (from the past 48 hour(s)). DG MINI  C-ARM IMAGE ONLY  Result Date: January 02, 2021 There is no interpretation for this exam.  This order is for images obtained during a surgical procedure.  Please See "Surgeries" Tab for more information regarding the procedure.    Review of Systems no recent fever, chills, nausea, vomiting or changes in her appetite  Blood pressure 124/63, pulse 60, temperature 97.8 F (36.6 C), temperature source Oral, resp. rate (!) 9, height 5' (1.524 m), weight 79.8 kg, SpO2 100 %. Physical Exam  Well-nourished well-developed woman in no apparent distress.  Alert and oriented.  Normal mood and affect.  The second toe is in a fixed hammertoe position.  Hallux has healed surgical incisions with no signs of infection.  Intact sensibility to light touch dorsally and plantarly at the forefoot.   Assessment/Plan Right second hammertoe deformity and metatarsalgia -to the operating room today for second hammertoe correction and Weil osteotomy of the second metatarsal.  The risks and benefits of the alternative treatment options have been discussed in detail.  The patient wishes to proceed with surgery and specifically understands risks of bleeding, infection, nerve damage, blood clots, need for additional surgery, amputation and death.   [name], MD Jan 02, 2021, 9:01 AM

## 2020-12-15 NOTE — Progress Notes (Signed)
Assisted Dr. Jana Half with right, ultrasound guided, popliteal block. Side rails up, monitors on throughout procedure. See vital signs in flow sheet. Tolerated Procedure well.

## 2020-12-15 NOTE — Anesthesia Procedure Notes (Signed)
Anesthesia Regional Block: Popliteal block   Pre-Anesthetic Checklist: , timeout performed,  Correct Patient, Correct Site, Correct Laterality,  Correct Procedure, Correct Position, site marked,  Risks and benefits discussed,  Surgical consent,  Pre-op evaluation,  At surgeon's request and post-op pain management  Laterality: Right  Prep: Dura Prep       Needles:  Injection technique: Single-shot  Needle Type: Echogenic Stimulator Needle     Needle Length: 10cm  Needle Gauge: 20     Additional Needles:   Procedures:,,,, ultrasound used (permanent image in chart),,    Narrative:  Start time: 12/15/2020 8:33 AM End time: 12/15/2020 8:37 AM Injection made incrementally with aspirations every 5 mL.  Performed by: Personally  Anesthesiologist: Darral Dash, DO  Additional Notes: Patient identified. Risks/Benefits/Options discussed with patient including but not limited to bleeding, infection, nerve damage, failed block, incomplete pain control. Patient expressed understanding and wished to proceed. All questions were answered. Sterile technique was used throughout the entire procedure. Please see nursing notes for vital signs. Aspirated in 5cc intervals with injection for negative confirmation. Patient was given instructions on fall risk and not to get out of bed. All questions and concerns addressed with instructions to call with any issues or inadequate analgesia.

## 2020-12-15 NOTE — Anesthesia Preprocedure Evaluation (Signed)
Anesthesia Evaluation  Patient identified by MRN, date of birth, ID band Patient awake    Reviewed: Allergy & Precautions, NPO status , Patient's Chart, lab work & pertinent test results  Airway Mallampati: II       Dental no notable dental hx.    Pulmonary neg pulmonary ROS,    Pulmonary exam normal        Cardiovascular + dysrhythmias (on Coumadin, last dose 10/14) Atrial Fibrillation  Rhythm:Irregular Rate:Normal     Neuro/Psych  Headaches, Anxiety Depression Bipolar Disorder    GI/Hepatic Neg liver ROS, GERD  Medicated,  Endo/Other  Hypothyroidism   Renal/GU CRFRenal disease  negative genitourinary   Musculoskeletal  (+) Arthritis , Osteoarthritis,  Right foot hammer toe   Abdominal Normal abdominal exam  (+)   Peds  Hematology  (+) anemia ,   Anesthesia Other Findings   Reproductive/Obstetrics                            Anesthesia Physical Anesthesia Plan  ASA: 3  Anesthesia Plan: General and Regional   Post-op Pain Management:  Regional for Post-op pain   Induction:   PONV Risk Score and Plan: 3 and Ondansetron, Dexamethasone and Treatment may vary due to age or medical condition  Airway Management Planned: Mask and LMA  Additional Equipment: None  Intra-op Plan:   Post-operative Plan: Extubation in OR  Informed Consent: I have reviewed the patients History and Physical, chart, labs and discussed the procedure including the risks, benefits and alternatives for the proposed anesthesia with the patient or authorized representative who has indicated his/her understanding and acceptance.     Dental advisory given  Plan Discussed with: CRNA  Anesthesia Plan Comments:         Anesthesia Quick Evaluation

## 2020-12-15 NOTE — Anesthesia Postprocedure Evaluation (Signed)
Anesthesia Post Note  Patient: Meredith [name]  Procedure(s) Performed: Right Second metarsal weil osteotomy; Second hammer toe correction (Right: Foot)     Patient location during evaluation: PACU Anesthesia Type: Regional and General Level of consciousness: awake and alert Pain management: pain level controlled Vital Signs Assessment: post-procedure vital signs reviewed and stable Respiratory status: spontaneous breathing, nonlabored ventilation, respiratory function stable and patient connected to nasal cannula oxygen Cardiovascular status: blood pressure returned to baseline and stable Postop Assessment: no apparent nausea or vomiting Anesthetic complications: no   No notable events documented.  Last Vitals:  Vitals:   12/15/20 1030 12/15/20 1115  BP: 124/64 (!) 133/48  Pulse: 72 80  Resp: 17 16  Temp:  36.4 C  SpO2: 95% 98%    Last Pain:  Vitals:   12/15/20 1115  TempSrc:   PainSc: 0-No pain                 Belenda Cruise P [place]

## 2020-12-15 NOTE — Op Note (Signed)
12/15/2020  10:12 AM  PATIENT:  Meredith [name]  70 y.o. female  PRE-OPERATIVE DIAGNOSIS:  Right foot second hammer toe and metatarsalgia  POST-OPERATIVE DIAGNOSIS: Same  Procedure(s): 1.  Right Second metarsal weil osteotomy 2.  Right second hammer toe correction 3.  AP and lateral radiographs of the right foot  SURGEON:  [name], MD  ASSISTANT: Mechele Claude, PA-C  ANESTHESIA:   General, regional  EBL:  minimal   TOURNIQUET:   Total Tourniquet Time Documented: Thigh (Right) - 20 minutes Total: Thigh (Right) - 20 minutes  COMPLICATIONS:  None apparent  DISPOSITION:  Extubated, awake and stable to recovery.  INDICATION FOR PROCEDURE: The patient is a 70 year old female with a long history of a painful right second hammertoe deformity.  She has failed nonoperative treatment to date and presents today for second hammertoe correction.  The risks and benefits of the alternative treatment options have been discussed in detail.  The patient wishes to proceed with surgery and specifically understands risks of bleeding, infection, nerve damage, blood clots, need for additional surgery, amputation and death.   PROCEDURE IN DETAIL:  After pre operative consent was obtained, and the correct operative site was identified, the patient was brought to the operating room and placed supine on the OR table.  Anesthesia was administered.  Pre-operative antibiotics were administered.  A surgical timeout was taken.  The right lower extremity was prepped and draped in standard sterile fashion with a tourniquet around the thigh.  The extremity was elevated and the tourniquet was inflated to 250 mmHg.  A longitudinal incision was made over the second MTP joint of the right foot.  Dissection was carried sharply down through the subcutaneous tissues.  The extensor tendons were lengthened.  The dorsal joint capsule was incised exposing the metatarsal head.  A Weil osteotomy was made with the  oscillating saw.  The head was allowed to retract proximally several millimeters and was fixed with a 2 mm [employer] FRS screw.  Overhanging bone was trimmed with a rondure.  Attention was turned to the PIP joint where a transverse incision was made.  Dissection was carried down through the subcutaneous tissues and extensor mechanism.  Collateral ligaments were released exposing the head of the proximal phalanx.  This was resected with the oscillating saw.  The base of the middle phalanx was also resected.  The joint was reduced and fixed with a 3.4 mm [employer] VPC screw.  AP and lateral radiographs confirmed appropriate reduction of the joint and appropriate shortening of the second metatarsal.  Hardware is appropriately positioned and of the appropriate lengths.  No other acute injuries are noted.     FOLLOW UP PLAN: Weightbearing as tolerated in a flat postop shoe.  Follow-up in the office in 2 weeks for suture removal and to initiate gentle active plantar flexion range of motion and scar massage.   RADIOGRAPHS: AP and lateral radiographs of the right foot are obtained intraoperatively.  These show interval arthrodesis of the PIP joint of the right foot second toe and shortening of the second metatarsal.  Hardware is appropriately positioned and of the appropriate lengths.  No other acute injuries are noted.    Mechele Claude PA-C was present and scrubbed for the duration of the operative case. His assistance was essential in positioning the patient, prepping and draping, gaining and maintaining exposure, performing the operation, closing and dressing the wounds and applying the splint.

## 2020-12-15 NOTE — Discharge Instructions (Addendum)
Meredith Simmer, MD [facility]  Please read the following information regarding your care after surgery.  Medications  You only need a prescription for the narcotic pain medicine (ex. oxycodone, Percocet, Norco).  All of the other medicines listed below are available over the counter. x Aleve 2 pills twice a day for the first 3 days after surgery. X acetominophen (Tylenol) 650 mg every 4-6 hours as you need for minor to moderate pain X dilaudid as prescribed for severe pain  Narcotic pain medicine (ex. oxycodone, Percocet, Vicodin) will cause constipation.  To prevent this problem, take the following medicines while you are taking any pain medicine. X docusate sodium (Colace) 100 mg twice a day X senna (Senokot) 2 tablets twice a day   Weight Bearing X Bear weight only on your operated foot in the post-op shoe.   Cast / Splint / Dressing X Keep your splint, cast or dressing clean and dry.  Don't put anything (coat hanger, pencil, etc) down inside of it.  If it gets damp, use a hair dryer on the cool setting to dry it.  If it gets soaked, call the office to schedule an appointment for a cast change.   After your dressing, cast or splint is removed; you may shower, but do not soak or scrub the wound.  Allow the water to run over it, and then gently pat it dry.  Swelling It is normal for you to have swelling where you had surgery.  To reduce swelling and pain, keep your toes above your nose for at least 3 days after surgery.  It may be necessary to keep your foot or leg elevated for several weeks.  If it hurts, it should be elevated.  Follow Up Call my office at [phone] when you are discharged from the hospital or surgery center to schedule an appointment to be seen two weeks after surgery.  Call my office at [phone] if you develop a fever >101.5 F, nausea, vomiting, bleeding from the surgical site or severe pain.      Post Anesthesia Home Care Instructions  Activity: Get  plenty of rest for the remainder of the day. A responsible individual must stay with you for 24 hours following the procedure.  For the next 24 hours, DO NOT: -Drive a car -Paediatric nurse -Drink alcoholic beverages -Take any medication unless instructed by your physician -Make any legal decisions or sign important papers.  Meals: Start with liquid foods such as gelatin or soup. Progress to regular foods as tolerated. Avoid greasy, spicy, heavy foods. If nausea and/or vomiting occur, drink only clear liquids until the nausea and/or vomiting subsides. Call your physician if vomiting continues.  Special Instructions/Symptoms: Your throat may feel dry or sore from the anesthesia or the breathing tube placed in your throat during surgery. If this causes discomfort, gargle with warm salt water. The discomfort should disappear within 24 hours.  If you had a scopolamine patch placed behind your ear for the management of post- operative nausea and/or vomiting:  1. The medication in the patch is effective for 72 hours, after which it should be removed.  Wrap patch in a tissue and discard in the trash. Wash hands thoroughly with soap and water. 2. You may remove the patch earlier than 72 hours if you experience unpleasant side effects which may include dry mouth, dizziness or visual disturbances. 3. Avoid touching the patch. Wash your hands with soap and water after contact with the patch.  Regional Anesthesia Blocks  1.  Numbness or the inability to move the "blocked" extremity may last from 3-48 hours after placement. The length of time depends on the medication injected and your individual response to the medication. If the numbness is not going away after 48 hours, call your surgeon.  2. The extremity that is blocked will need to be protected until the numbness is gone and the  Strength has returned. Because you cannot feel it, you will need to take extra care to avoid injury. Because it may be  weak, you may have difficulty moving it or using it. You may not know what position it is in without looking at it while the block is in effect.  3. For blocks in the legs and feet, returning to weight bearing and walking needs to be done carefully. You will need to wait until the numbness is entirely gone and the strength has returned. You should be able to move your leg and foot normally before you try and bear weight or walk. You will need someone to be with you when you first try to ensure you do not fall and possibly risk injury.  4. Bruising and tenderness at the needle site are common side effects and will resolve in a few days.  5. Persistent numbness or new problems with movement should be communicated to the surgeon or [name] [phone] [place] [phone]).

## 2020-12-15 NOTE — Anesthesia Procedure Notes (Signed)
Procedure Name: LMA Insertion Date/Time: 12/15/2020 9:30 AM Performed by: Signe Colt, CRNA Pre-anesthesia Checklist: Patient identified, Emergency Drugs available, Suction available and Patient being monitored Patient Re-evaluated:Patient Re-evaluated prior to induction Oxygen Delivery Method: Circle System Utilized Preoxygenation: Pre-oxygenation with 100% oxygen Induction Type: IV induction Ventilation: Mask ventilation without difficulty LMA: LMA inserted LMA Size: 4.0 Number of attempts: 1 Airway Equipment and Method: bite block Placement Confirmation: positive ETCO2 Tube secured with: Tape Dental Injury: Teeth and Oropharynx as per pre-operative assessment

## 2020-12-15 NOTE — Transfer of Care (Signed)
Immediate Anesthesia Transfer of Care Note  Patient: Meredith [name]  Procedure(s) Performed: Right Second metarsal weil osteotomy; Second hammer toe correction (Right: Foot)  Patient Location: PACU  Anesthesia Type:GA combined with regional for post-op pain  Level of Consciousness: drowsy and patient cooperative  Airway & Oxygen Therapy: Patient Spontanous Breathing and Patient connected to face mask oxygen  Post-op Assessment: Report given to RN and Post -op Vital signs reviewed and stable  Post vital signs: Reviewed and stable  Last Vitals:  Vitals Value Taken Time  BP    Temp    Pulse 72 12/15/20 1007  Resp    SpO2 97 % 12/15/20 1007  Vitals shown include unvalidated device data.  Last Pain:  Vitals:   12/15/20 0731  TempSrc: Oral  PainSc: 7          Complications: No notable events documented.

## 2020-12-16 ENCOUNTER — Encounter (HOSPITAL_BASED_OUTPATIENT_CLINIC_OR_DEPARTMENT_OTHER): Payer: Self-pay | Admitting: Orthopedic Surgery

## 2020-12-21 ENCOUNTER — Ambulatory Visit (INDEPENDENT_AMBULATORY_CARE_PROVIDER_SITE_OTHER): Payer: Medicare Other | Admitting: Psychology

## 2020-12-21 DIAGNOSIS — F3181 Bipolar II disorder: Secondary | ICD-10-CM

## 2020-12-22 ENCOUNTER — Other Ambulatory Visit: Payer: Self-pay

## 2020-12-22 ENCOUNTER — Ambulatory Visit (INDEPENDENT_AMBULATORY_CARE_PROVIDER_SITE_OTHER): Payer: Medicare Other

## 2020-12-22 DIAGNOSIS — Z5181 Encounter for therapeutic drug level monitoring: Secondary | ICD-10-CM

## 2020-12-22 DIAGNOSIS — I480 Paroxysmal atrial fibrillation: Secondary | ICD-10-CM

## 2020-12-22 LAB — POCT INR: INR: 1.5 — AB (ref 2.0–3.0)

## 2020-12-22 NOTE — Patient Instructions (Signed)
Description   Take an extra 1/2 tablet today, then take 2 tablets tomorrow, then resume same dosage of Warfarin 1.5 tablets everyday.  Recheck INR in 2 weeks.  Call with any medication changes or if you are scheduled for surgery [facility] [phone] Main [phone]

## 2020-12-29 DIAGNOSIS — M1612 Unilateral primary osteoarthritis, left hip: Secondary | ICD-10-CM | POA: Diagnosis not present

## 2021-01-04 ENCOUNTER — Ambulatory Visit (INDEPENDENT_AMBULATORY_CARE_PROVIDER_SITE_OTHER): Payer: Medicare Other | Admitting: Psychology

## 2021-01-04 DIAGNOSIS — F3181 Bipolar II disorder: Secondary | ICD-10-CM | POA: Diagnosis not present

## 2021-01-05 ENCOUNTER — Ambulatory Visit (INDEPENDENT_AMBULATORY_CARE_PROVIDER_SITE_OTHER): Payer: Medicare Other

## 2021-01-05 ENCOUNTER — Other Ambulatory Visit: Payer: Self-pay

## 2021-01-05 DIAGNOSIS — Z5181 Encounter for therapeutic drug level monitoring: Secondary | ICD-10-CM | POA: Diagnosis not present

## 2021-01-05 DIAGNOSIS — I480 Paroxysmal atrial fibrillation: Secondary | ICD-10-CM

## 2021-01-05 LAB — POCT INR: INR: 1.7 — AB (ref 2.0–3.0)

## 2021-01-05 NOTE — Patient Instructions (Signed)
Description   Take an extra 0.5 tablet today, then START taking Warfarin 1.5 tablets everyday except 2 tablets on Mondays and Fridays.  Recheck INR in 2 weeks.  Call with any medication changes or if you are scheduled for surgery [facility] [phone] Main [phone]

## 2021-01-09 DIAGNOSIS — J3089 Other allergic rhinitis: Secondary | ICD-10-CM | POA: Diagnosis not present

## 2021-01-09 DIAGNOSIS — J300 Vasomotor rhinitis: Secondary | ICD-10-CM | POA: Diagnosis not present

## 2021-01-09 DIAGNOSIS — R040 Epistaxis: Secondary | ICD-10-CM | POA: Diagnosis not present

## 2021-01-09 DIAGNOSIS — T63481D Toxic effect of venom of other arthropod, accidental (unintentional), subsequent encounter: Secondary | ICD-10-CM | POA: Diagnosis not present

## 2021-01-11 DIAGNOSIS — M25559 Pain in unspecified hip: Secondary | ICD-10-CM | POA: Diagnosis not present

## 2021-01-11 DIAGNOSIS — G894 Chronic pain syndrome: Secondary | ICD-10-CM | POA: Diagnosis not present

## 2021-01-11 DIAGNOSIS — M79673 Pain in unspecified foot: Secondary | ICD-10-CM | POA: Diagnosis not present

## 2021-01-11 DIAGNOSIS — M7918 Myalgia, other site: Secondary | ICD-10-CM | POA: Diagnosis not present

## 2021-01-11 DIAGNOSIS — M47816 Spondylosis without myelopathy or radiculopathy, lumbar region: Secondary | ICD-10-CM | POA: Diagnosis not present

## 2021-01-11 DIAGNOSIS — Z79899 Other long term (current) drug therapy: Secondary | ICD-10-CM | POA: Diagnosis not present

## 2021-01-11 DIAGNOSIS — Z981 Arthrodesis status: Secondary | ICD-10-CM | POA: Diagnosis not present

## 2021-01-11 DIAGNOSIS — Z79891 Long term (current) use of opiate analgesic: Secondary | ICD-10-CM | POA: Diagnosis not present

## 2021-01-11 DIAGNOSIS — T85192A Other mechanical complication of implanted electronic neurostimulator (electrode) of spinal cord, initial encounter: Secondary | ICD-10-CM | POA: Diagnosis not present

## 2021-01-11 DIAGNOSIS — M961 Postlaminectomy syndrome, not elsewhere classified: Secondary | ICD-10-CM | POA: Diagnosis not present

## 2021-01-16 DIAGNOSIS — G43719 Chronic migraine without aura, intractable, without status migrainosus: Secondary | ICD-10-CM | POA: Diagnosis not present

## 2021-01-16 DIAGNOSIS — M791 Myalgia, unspecified site: Secondary | ICD-10-CM | POA: Diagnosis not present

## 2021-01-16 DIAGNOSIS — M542 Cervicalgia: Secondary | ICD-10-CM | POA: Diagnosis not present

## 2021-01-16 DIAGNOSIS — G518 Other disorders of facial nerve: Secondary | ICD-10-CM | POA: Diagnosis not present

## 2021-01-18 ENCOUNTER — Ambulatory Visit (INDEPENDENT_AMBULATORY_CARE_PROVIDER_SITE_OTHER): Payer: Medicare Other | Admitting: *Deleted

## 2021-01-18 ENCOUNTER — Ambulatory Visit: Payer: Medicare Other | Admitting: Psychology

## 2021-01-18 ENCOUNTER — Other Ambulatory Visit: Payer: Self-pay

## 2021-01-18 DIAGNOSIS — I480 Paroxysmal atrial fibrillation: Secondary | ICD-10-CM | POA: Diagnosis not present

## 2021-01-18 DIAGNOSIS — Z5181 Encounter for therapeutic drug level monitoring: Secondary | ICD-10-CM

## 2021-01-18 LAB — POCT INR: INR: 2.1 (ref 2.0–3.0)

## 2021-01-18 NOTE — Patient Instructions (Signed)
Description   Continue taking Warfarin 1.5 tablets everyday except 2 tablets on Mondays and Fridays. Recheck INR in 4 weeks. Call with any medication changes or if you are scheduled for surgery [facility] [phone] Main [phone]

## 2021-01-21 ENCOUNTER — Other Ambulatory Visit: Payer: Self-pay | Admitting: Psychiatry

## 2021-01-21 DIAGNOSIS — F3181 Bipolar II disorder: Secondary | ICD-10-CM

## 2021-01-25 DIAGNOSIS — Z4889 Encounter for other specified surgical aftercare: Secondary | ICD-10-CM | POA: Diagnosis not present

## 2021-01-25 DIAGNOSIS — M79671 Pain in right foot: Secondary | ICD-10-CM | POA: Diagnosis not present

## 2021-01-25 DIAGNOSIS — M2041 Other hammer toe(s) (acquired), right foot: Secondary | ICD-10-CM | POA: Diagnosis not present

## 2021-01-30 ENCOUNTER — Other Ambulatory Visit: Payer: Self-pay | Admitting: Psychiatry

## 2021-01-30 DIAGNOSIS — F3132 Bipolar disorder, current episode depressed, moderate: Secondary | ICD-10-CM

## 2021-01-31 ENCOUNTER — Ambulatory Visit (INDEPENDENT_AMBULATORY_CARE_PROVIDER_SITE_OTHER): Payer: Medicare Other | Admitting: Psychiatry

## 2021-01-31 ENCOUNTER — Other Ambulatory Visit: Payer: Self-pay

## 2021-01-31 ENCOUNTER — Encounter: Payer: Self-pay | Admitting: Psychiatry

## 2021-01-31 DIAGNOSIS — F4001 Agoraphobia with panic disorder: Secondary | ICD-10-CM | POA: Diagnosis not present

## 2021-01-31 DIAGNOSIS — F3132 Bipolar disorder, current episode depressed, moderate: Secondary | ICD-10-CM

## 2021-01-31 DIAGNOSIS — G3184 Mild cognitive impairment, so stated: Secondary | ICD-10-CM | POA: Diagnosis not present

## 2021-01-31 DIAGNOSIS — F5105 Insomnia due to other mental disorder: Secondary | ICD-10-CM

## 2021-01-31 DIAGNOSIS — G9340 Encephalopathy, unspecified: Secondary | ICD-10-CM | POA: Diagnosis not present

## 2021-01-31 DIAGNOSIS — F411 Generalized anxiety disorder: Secondary | ICD-10-CM | POA: Diagnosis not present

## 2021-01-31 DIAGNOSIS — F4310 Post-traumatic stress disorder, unspecified: Secondary | ICD-10-CM | POA: Diagnosis not present

## 2021-01-31 DIAGNOSIS — F3181 Bipolar II disorder: Secondary | ICD-10-CM

## 2021-01-31 MED ORDER — ARIPIPRAZOLE 20 MG PO TABS: 20.0000 mg | ORAL_TABLET | Freq: Every day | ORAL | 3 refills | Status: DC

## 2021-01-31 MED ORDER — QUETIAPINE FUMARATE 200 MG PO TABS: 200.0000 mg | ORAL_TABLET | Freq: Every day | ORAL | 3 refills | Status: DC

## 2021-01-31 NOTE — Progress Notes (Signed)
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 be medication related.  Patient denies ever taking any medications including not ever taking hydrocodone.  She has had 3 episodes over the last couple of years.  1 was possibly related to lithium toxicity but the other 2 do not appear related.  She claims compliance with medication and that she is using a pillbox for everything except the opiate.  Discussed that she could be possibly overtaking the opiate at times and not realize it.  She denies doing this.  The 3 medications that were stopped at the hospital, Abilify, lamotrigine and lithium in this case were highly unlikely to cause acute encephalopathy.  So the cause for this episode is unknown.  The patient has not been stable psychiatrically on low-dose quetiapine in the past and is highly likely to relapse into some version of either depression or mania or mixed episode.    At her last appointment she felt distressed and unstable and wanted some sort of medication change.  Abilify was felt  to be unlikely to cause cognitive impairment or sedation and may add some stability to the Seroquel which she needs for sleep.  Added Abilify 20 mg to Seroquel 200 mg nightly and she so far is more stable and tolerating the medications well.  Cognitively she appears clearer today  Using pillbox to help compliance.  Disc danger of mixing up or doubling up meds.  She fills box herself.  Rec she get help with this. Cannot afford branded meds which prevents [place] from using some of the bipolar depression meds like Latuda, Vraylar.  continue quetiapine to 200 mg at bedtime  She might have unrealistic expectations to sleep 9 hours.  Disc tolerance.  Push fluids.  Has to remind herself..  Disc ways to talk to brother about helping her.   Discussed potential metabolic side effects associated with atypical antipsychotics, as well as potential risk for movement side effects. Advised pt to contact office if movement side effects occur.   continue Abilify for rapid cycling mixed bipolar 20 mg daily. Continue Seroquel 200 mg HS. This has not been suffiecient to maintan mood  Using pill box to keep up with meds.  OK Ativan prn for dental procedure 1-2 mg.  No driving after it.  PCP Elyn Peers, [facility]  Continue therapy with Dr. Cheryln Manly q 2 weeks. Consider better medication coverage when gets new insurance Discussed this issue with her again although I think she again shows a cheaper plan which will limit medication options.  Bring meds to office.  Follow-up 2-3 mos  Lynder Parents MD, DFAPA  Please see After Visit Summary for patient specific instructions.  Future Appointments  Date Time Provider [place]  02/01/2021  1:00 PM Oren Binet, PhD [facility] None  02/15/2021  1:00 PM Oren Binet, PhD [facility] None  02/16/2021  2:15 PM [facility]  03/01/2021  1:00 PM Oren Binet, PhD [facility] None  03/03/2021  3:00 PM  Werner Lean, MD [facility]  03/15/2021  1:00 PM Oren Binet, PhD [facility] None  03/29/2021  1:00 PM Oren Binet, PhD [facility] None  04/12/2021  1:00 PM Oren Binet, PhD [facility] None    No orders of the defined types were placed in this encounter.     -------------------------------

## 2021-01-31 NOTE — Progress Notes (Signed)
Progress Note Date: 02/01/2021  Diagnosis F31.81 (Bipolar II disorder) [n/a]  Symptoms Depressed or irritable mood. (Status: maintained) -- No Description Entered  Lack of energy. (Status: maintained) -- No Description Entered  Social withdrawal. (Status: maintained) -- No Description Entered  Medication Status compliance  Safety none  If Suicidal or Homicidal State Action Taken: unspecified  Current Risk: low Medications Abilify (Dosage: 30mg )  Hydocodone (Dosage: 7.5mg )  Lamotrigine (Dosage: 200mg /day)  Lithium (Dosage: 450mg /day)  Seroquel (Dosage: 200mg /day)  Objectives Related Problem: Develop healthy cognitive patterns and beliefs about self and the world that lead to alleviation and help prevent the relapse of mood episodes. Description: Verbalize grief, fear, and anger regarding real or imagined losses in life. Target Date: 2021-04-11 Frequency: Daily Modality: individual Progress: 60%  Related Problem: Develop healthy cognitive patterns and beliefs about self and the world that lead to alleviation and help prevent the relapse of mood episodes. Description: Take prescribed medications as directed. Target Date: 2021-04-11 Frequency: Daily Modality: individual Progress: 80%  Related Problem: Develop healthy cognitive patterns and beliefs about self and the world that lead to alleviation and help prevent the relapse of mood episodes. Description: Describe mood state, energy level, amount of control over thoughts, and sleeping pattern. Target Date: 2021-04-11 Frequency: Daily Modality: individual Progress: 50%  Client Response full compliance  Service Location Location, [name] [place], [place] [address]  Service Code cpt [phone]  Neuropsych. testing  Related past to present  Self-monitoring  Self care activities  Emotion regulation skills  Validate/empathize  Facilitate problem solving  Normalize/Reframe  Journaling  Comments  Dx.: F31.81 Goals:  States she is seeking emotional stability. She needs guidance in managing some of her physical/medical challenges and limitations. Also, have some interpersonal challenges and seeks feedback on addressing these difficulties. Target date is 2-23. Meds: Seroquel (200mg  daily), Abilify 20mg , Hydrocodone (7.5 mg up to 3x/day) Patient requests a video session due to the Coronavirus. She is in the hospital and I am at my home office.   Kayleana is feeling good about her post-op vist from [name]. She is now able to walk more and get exercise. She says she had periodic nights when she cannot sleep. This only happens about 1x every 2 weeks. She reports that her anxiety has been in check with few flare ups.  She says she and her brother are both going to [place], [place]. for Christmas. She reports feeling more stable and is enthusiastic about Christmas.  She asked me about returning to the office and we talked about mask requirements. She would like to come in when we return, but unsure about wearing a mask. Informed me that she has changed her insurance (Medicare Supplement).    Jarad Barth [name]

## 2021-02-01 ENCOUNTER — Ambulatory Visit (INDEPENDENT_AMBULATORY_CARE_PROVIDER_SITE_OTHER): Payer: Medicare Other | Admitting: Psychology

## 2021-02-01 DIAGNOSIS — F3181 Bipolar II disorder: Secondary | ICD-10-CM | POA: Diagnosis not present

## 2021-02-01 DIAGNOSIS — F3164 Bipolar disorder, current episode mixed, severe, with psychotic features: Secondary | ICD-10-CM | POA: Diagnosis not present

## 2021-02-13 ENCOUNTER — Other Ambulatory Visit: Payer: Self-pay

## 2021-02-13 ENCOUNTER — Ambulatory Visit (INDEPENDENT_AMBULATORY_CARE_PROVIDER_SITE_OTHER): Payer: Medicare Other

## 2021-02-13 DIAGNOSIS — I480 Paroxysmal atrial fibrillation: Secondary | ICD-10-CM

## 2021-02-13 DIAGNOSIS — Z5181 Encounter for therapeutic drug level monitoring: Secondary | ICD-10-CM

## 2021-02-13 LAB — POCT INR: INR: 2.5 (ref 2.0–3.0)

## 2021-02-13 NOTE — Patient Instructions (Signed)
Description   Continue taking Warfarin 1.5 tablets everyday except 2 tablets on Mondays and Fridays. Recheck INR in 4 weeks. Call with any medication changes or if you are scheduled for surgery [facility] [phone] Main [phone]

## 2021-02-14 DIAGNOSIS — G894 Chronic pain syndrome: Secondary | ICD-10-CM | POA: Diagnosis not present

## 2021-02-14 DIAGNOSIS — M47816 Spondylosis without myelopathy or radiculopathy, lumbar region: Secondary | ICD-10-CM | POA: Diagnosis not present

## 2021-02-14 DIAGNOSIS — M961 Postlaminectomy syndrome, not elsewhere classified: Secondary | ICD-10-CM | POA: Diagnosis not present

## 2021-02-14 DIAGNOSIS — Z79891 Long term (current) use of opiate analgesic: Secondary | ICD-10-CM | POA: Diagnosis not present

## 2021-02-14 DIAGNOSIS — Z981 Arthrodesis status: Secondary | ICD-10-CM | POA: Diagnosis not present

## 2021-02-14 DIAGNOSIS — Z79899 Other long term (current) drug therapy: Secondary | ICD-10-CM | POA: Diagnosis not present

## 2021-02-15 ENCOUNTER — Ambulatory Visit (INDEPENDENT_AMBULATORY_CARE_PROVIDER_SITE_OTHER): Payer: Medicare Other | Admitting: Psychology

## 2021-02-15 DIAGNOSIS — F3181 Bipolar II disorder: Secondary | ICD-10-CM

## 2021-02-15 NOTE — Progress Notes (Signed)
Progress Note Date: 02/15/2021  Treatment Plan: Diagnosis F31.81 (Bipolar II disorder) [n/a]  Symptoms Depressed or irritable mood. (Status: maintained) -- No Description Entered  Lack of energy. (Status: maintained) -- No Description Entered  Social withdrawal. (Status: maintained) -- No Description Entered  Medication Status compliance  Safety none  If Suicidal or Homicidal State Action Taken: unspecified  Current Risk: low Medications Abilify (Dosage: 30mg )  Hydocodone (Dosage: 7.5mg )  Lamotrigine (Dosage: 200mg /day)  Lithium (Dosage: 450mg /day)  Seroquel (Dosage: 200mg /day)  Objectives Related Problem: Develop healthy cognitive patterns and beliefs about self and the world that lead to alleviation and help prevent the relapse of mood episodes. Description: Verbalize grief, fear, and anger regarding real or imagined losses in life. Target Date: 2021-04-11 Frequency: Daily Modality: individual Progress: 60%  Related Problem: Develop healthy cognitive patterns and beliefs about self and the world that lead to alleviation and help prevent the relapse of mood episodes. Description: Take prescribed medications as directed. Target Date: 2021-04-11 Frequency: Daily Modality: individual Progress: 80%  Related Problem: Develop healthy cognitive patterns and beliefs about self and the world that lead to alleviation and help prevent the relapse of mood episodes. Description: Describe mood state, energy level, amount of control over thoughts, and sleeping pattern. Target Date: 2021-04-11 Frequency: Daily Modality: individual Progress: 50%  Client Response full compliance  Service Location Location, [name] [place], [place] [address]  Service Code cpt [phone]  Neuropsych. testing  Related past to present  Self-monitoring  Self care activities  Emotion regulation skills  Validate/empathize  Facilitate problem solving  Normalize/Reframe  Journaling  Comments   Dx.: F31.81  Goals: States she is seeking emotional stability. She needs guidance in managing some of her physical/medical challenges and limitations. Also, have some interpersonal challenges and seeks feedback on addressing these difficulties. Target date is 2-23.  Meds: Seroquel (200mg  daily), Abilify 20mg , Hydrocodone (7.5 mg up to 3x/day)  Patient requests a video session due to the Coronavirus. She is in the hospital and I am at my home office.   Kobi is leaving for [place] tomorrow with her brother, for the Christmas holiday. Will return on Monday. She has been active going to some church and social functions. She says that Christmas is a difficult time. Her father was killed in a car crash 2 days before Christmas when on his way to see [name]' baby daughter. She says that "to this day I blame my brother Eddie Dibbles" because he was supposed to drive. Eddie Dibbles and her mother were in the car and sustained minor injuries. Wajiha says she had a preminition that something bad was going to happen and begged her parents not to go. Gets upset to talk about the accident.  Reports that she is changing her insurance to an [place] with [employer] for 4-5 weeks. Wants to check to make sure I am on that plan as a provider.     [name], PhD Time: 1:15-2:00 45 minutes

## 2021-02-22 DIAGNOSIS — M810 Age-related osteoporosis without current pathological fracture: Secondary | ICD-10-CM | POA: Diagnosis not present

## 2021-02-22 DIAGNOSIS — K219 Gastro-esophageal reflux disease without esophagitis: Secondary | ICD-10-CM | POA: Diagnosis not present

## 2021-02-22 DIAGNOSIS — D649 Anemia, unspecified: Secondary | ICD-10-CM | POA: Diagnosis not present

## 2021-02-22 DIAGNOSIS — I480 Paroxysmal atrial fibrillation: Secondary | ICD-10-CM | POA: Diagnosis not present

## 2021-02-22 DIAGNOSIS — F323 Major depressive disorder, single episode, severe with psychotic features: Secondary | ICD-10-CM | POA: Diagnosis not present

## 2021-02-22 DIAGNOSIS — Z4789 Encounter for other orthopedic aftercare: Secondary | ICD-10-CM | POA: Diagnosis not present

## 2021-02-22 DIAGNOSIS — G43009 Migraine without aura, not intractable, without status migrainosus: Secondary | ICD-10-CM | POA: Diagnosis not present

## 2021-02-22 DIAGNOSIS — N183 Chronic kidney disease, stage 3 unspecified: Secondary | ICD-10-CM | POA: Diagnosis not present

## 2021-02-22 DIAGNOSIS — E039 Hypothyroidism, unspecified: Secondary | ICD-10-CM | POA: Diagnosis not present

## 2021-02-22 DIAGNOSIS — E782 Mixed hyperlipidemia: Secondary | ICD-10-CM | POA: Diagnosis not present

## 2021-02-22 DIAGNOSIS — F3181 Bipolar II disorder: Secondary | ICD-10-CM | POA: Diagnosis not present

## 2021-02-22 DIAGNOSIS — M2041 Other hammer toe(s) (acquired), right foot: Secondary | ICD-10-CM | POA: Diagnosis not present

## 2021-02-22 DIAGNOSIS — M19079 Primary osteoarthritis, unspecified ankle and foot: Secondary | ICD-10-CM | POA: Diagnosis not present

## 2021-02-23 DIAGNOSIS — M791 Myalgia, unspecified site: Secondary | ICD-10-CM | POA: Diagnosis not present

## 2021-02-23 DIAGNOSIS — M542 Cervicalgia: Secondary | ICD-10-CM | POA: Diagnosis not present

## 2021-02-23 DIAGNOSIS — G518 Other disorders of facial nerve: Secondary | ICD-10-CM | POA: Diagnosis not present

## 2021-02-23 DIAGNOSIS — G43719 Chronic migraine without aura, intractable, without status migrainosus: Secondary | ICD-10-CM | POA: Diagnosis not present

## 2021-03-01 ENCOUNTER — Ambulatory Visit: Payer: Medicare Other | Admitting: Psychology

## 2021-03-03 ENCOUNTER — Ambulatory Visit: Payer: Medicare Other | Admitting: Internal Medicine

## 2021-03-03 ENCOUNTER — Other Ambulatory Visit: Payer: Self-pay

## 2021-03-03 ENCOUNTER — Encounter: Payer: Self-pay | Admitting: Internal Medicine

## 2021-03-03 VITALS — BP 120/69 | HR 70 | Ht 60.0 in | Wt 172.0 lb

## 2021-03-03 DIAGNOSIS — I480 Paroxysmal atrial fibrillation: Secondary | ICD-10-CM | POA: Diagnosis not present

## 2021-03-03 DIAGNOSIS — Z5181 Encounter for therapeutic drug level monitoring: Secondary | ICD-10-CM | POA: Diagnosis not present

## 2021-03-03 NOTE — Progress Notes (Signed)
Cardiology Office Note:    Date:  03/03/2021   ID:  Meredith [name], DOB 07-13-1950, MRN [id number]  PCP:  Kristen Loader, [facility] Providers Cardiologist:  Werner Lean, MD     Referring MD: Kristen Loader, FNP   CC: Transition to new cardiologist  History of Present Illness:    Meredith [name] is a 71 y.o. female with a hx of PAF s/p 3 ablations (last cryoablations last in 2018) in [place], DM, bipolar on chronic lithium, prior acute fall with no bleeding issues and maintained on coumadin.    Seen 03/03/21.  Patient notes that she is doing well.   One year ago had atrial fibrillation that almost kept her from a colonoscopy; but she transitioned to SR with propofol.   In the past her symptoms her heart pounding and like a heart attack. There are no interval hospital/ED visit.    No chest pain or pressure .  No SOB/DOE and no PND/Orthopnea.  No weight gain or leg swelling.  No palpitations or syncope .  Has had daily migraines and takes ASA 325 for moderate to severe headache because she is not being prescribed anything else.  Has rare nosebleed but is otherwise well.  Does have easy bruising.   Past Medical History:  Diagnosis Date   Anxiety    Atrial fibrillation (HCC)    Atrial fibrillation (HCC)    Bipolar 2 disorder ([place])    Chronic kidney disease    Depression    GERD (gastroesophageal reflux disease)    Hematoma 07/06/2020   History of cardioversion    x2   History of cardioversion    Hyperlipidemia    Hypothyroidism    Ingrown toenail 10/10/2020   Migraine headache    Osteomyelitis of foot ([place]) 11/11/2020   Osteoporosis    Pre-diabetes    Septic arthritis of interphalangeal joint of toe of right foot ([place]) 07/06/2020    Past Surgical History:  Procedure Laterality Date   ABLATION     BACK SURGERY  2014   CARDIOVERSION  12/2013, 01/2014   x2   HAMMERTOE RECONSTRUCTION WITH WEIL OSTEOTOMY Right 12/15/2020   Procedure:  Right Second metarsal weil osteotomy; Second hammer toe correction;  Surgeon: [name], MD;  Location: [facility];  Service: Orthopedics;  Laterality: Right;   KNEE SURGERY     ORTHOSCOPIC   LUNG REMOVAL, PARTIAL Right    BENIGN LUNG CYST   SYNOVIAL BIOPSY Right 06/16/2020   Procedure: Right hallux interphalangeal joint arthrotomy and biopsy;  Surgeon: [name], MD;  Location: [facility];  Service: Orthopedics;  Laterality: Right;   THORACIC SPINE SURGERY Right    THUMB ARTHROSCOPY      Current Medications: Current Meds  Medication Sig   ARIPiprazole (ABILIFY) 20 MG tablet Take 1 tablet (20 mg total) by mouth daily.   Ascorbic Acid (VITAMIN C) 1000 MG tablet Take 1,000 mg by mouth daily.   aspirin 325 MG tablet Take 325 mg by mouth daily as needed for headache.    baclofen (LIORESAL) 10 MG tablet Take 1 tablet by mouth as needed.   bisacodyl (DULCOLAX) 5 MG EC tablet PRN   Cholecalciferol (VITAMIN D3) 125 MCG (5000 UT) TABS Take 1 tablet by mouth daily at 12 noon.   clotrimazole (LOTRIMIN) 1 % cream APPLY CREAM TOPICALLY TO AFFECTED AREA TWICE DAILY   docusate sodium (COLACE) 100 MG capsule Take 100 mg by mouth  2 (two) times daily.   EPINEPHrine 0.3 mg/0.3 mL IJ SOAJ injection    estradiol (ESTRACE) 1 MG tablet Take 1 mg by mouth daily.   ferrous sulfate 325 (65 FE) MG tablet Take 325 mg by mouth 2 (two) times daily.   Galcanezumab-gnlm (EMGALITY) 120 MG/ML SOAJ Inject into the skin.   HYDROmorphone (DILAUDID) 2 MG tablet as needed.   ipratropium (ATROVENT) 0.03 % nasal spray 1-2 sprays in each nostril   levocetirizine (XYZAL) 5 MG tablet Take 5 mg by mouth every evening.   levothyroxine (SYNTHROID) 112 MCG tablet Take 112 mcg by mouth daily before breakfast.   loperamide (IMODIUM) 2 MG capsule Take 1 capsule (2 mg total) by mouth as needed for diarrhea or loose stools.   Melatonin 5 MG TABS Take 20 mg by mouth at bedtime.   metoprolol succinate  (TOPROL-XL) 50 MG 24 hr tablet Take 1 tablet (50 mg total) by mouth daily. Take with or immediately following a meal. Hold for low blood pressure or heart rate less than 60.   Multiple Vitamin (MULTIVITAMIN WITH MINERALS) TABS tablet Take 1 tablet by mouth daily.   naloxone (NARCAN) 0.4 MG/ML injection    ondansetron (ZOFRAN) 4 MG tablet Take 1 tablet (4 mg total) by mouth every 8 (eight) hours as needed for nausea or vomiting.   pantoprazole (PROTONIX) 40 MG tablet Take 1 tablet (40 mg total) by mouth daily.   pravastatin (PRAVACHOL) 20 MG tablet Take 20 mg by mouth daily.   pregabalin (LYRICA) 150 MG capsule Take 150 mg by mouth 3 (three) times daily.   QUEtiapine (SEROQUEL) 200 MG tablet TAKE 1 TABLET BY MOUTH AT BEDTIME   QUEtiapine (SEROQUEL) 200 MG tablet Take 1 tablet (200 mg total) by mouth at bedtime.   verapamil (VERELAN PM) 120 MG 24 hr capsule Take 1 capsule (120 mg total) by mouth at bedtime. Hold for low blood pressures   vitamin B-12 1000 MCG tablet Take 1 tablet (1,000 mcg total) by mouth daily.   warfarin (COUMADIN) 2.5 MG tablet TAKE 1 & 1/2 (ONE & ONE-HALF) TABLETS BY MOUTH ONCE DAILY OR  AS  DIRECTED  BY  COUMADIN  CLINIC   [DISCONTINUED] pregabalin (LYRICA) 150 MG capsule Take 1 capsule (150 mg total) by mouth 2 (two) times daily. (Patient taking differently: Take 150 mg by mouth 2 (two) times daily. 3 tabs daily)     Allergies:   Codeine, Adhesive [tape], Celebrex [celecoxib], Erythromycin, Other, Penicillamine, Sulfa antibiotics, Tricyclic antidepressants, Amoxicillin, Ampicillin, Cephalexin, Macrodantin [nitrofurantoin], and Penicillins   Social History   Socioeconomic History   Marital status: Single    Spouse name: Not on file   Number of children: 3   Years of education: Not on file   Highest education level: Not on file  Occupational History   Occupation: Retired  Tobacco Use   Smoking status: Never   Smokeless tobacco: Never  Vaping Use   Vaping Use: Not on  file  Substance and Sexual Activity   Alcohol use: Not Currently   Drug use: No   Sexual activity: Not Currently  Other Topics Concern   Not on file  Social History Narrative   Not on file   Social Determinants of Health   Financial Resource Strain: Not on file  Food Insecurity: Not on file  Transportation Needs: Not on file  Physical Activity: Not on file  Stress: Not on file  Social Connections: Not on file     Family History: The  patient's family history includes Alcohol abuse in her brother; Arthritis in her mother; Asthma in her brother; Atrial fibrillation in her brother; Depression in her mother; Diabetes in her mother; Drug abuse in her brother; Early death in her father; Heart disease in her father and mother; Hyperlipidemia in her brother; Hypertension in her brother; Mental illness in her brother; Multiple sclerosis in her sister; Stroke in her mother.  ROS:   Please see the history of present illness.     All other systems reviewed and are negative.  EKGs/Labs/Other Studies Reviewed:    The following studies were reviewed today:  Transthoracic Echocardiogram: Date: 08/18/20 Results: 1. Left ventricular ejection fraction, by estimation, is 60 to 65%. The  left ventricle has normal function. The left ventricle has no regional  wall motion abnormalities. Left ventricular diastolic function could not  be evaluated.   2. Right ventricular systolic function is normal. The right ventricular  size is normal. There is mildly elevated pulmonary artery systolic  pressure.   3. Left atrial size was mildly dilated.   4. The mitral valve is normal in structure. Mild mitral valve  regurgitation. No evidence of mitral stenosis.   5. The aortic valve is tricuspid. Aortic valve regurgitation is mild. No  aortic stenosis is present.     Recent Labs: 08/16/2020: ALT 10 08/18/2020: TSH 0.539 08/22/2020: Magnesium 2.0 11/11/2020: Hemoglobin 13.2; Platelets 208 12/12/2020: BUN  14; Creatinine, Ser 0.99; Potassium 4.5; Sodium 140  Recent Lipid Panel    Component Value Date/Time   CHOL 263 (H) 08/18/2020 0010   TRIG 106 08/18/2020 0010   HDL 111 08/18/2020 0010   CHOLHDL 2.4 08/18/2020 0010   VLDL 21 08/18/2020 0010   LDLCALC 131 (H) 08/18/2020 0010        Physical Exam:    VS:  BP 120/69    Pulse 70    Ht 5' (1.524 m)    Wt 78 kg    LMP  (LMP Unknown)    SpO2 97%    BMI 33.59 kg/m     Wt Readings from Last 3 Encounters:  03/03/21 78 kg  12/15/20 79.8 kg  11/11/20 79.8 kg    Gen: No distress  Neck: No JVD Cardiac: No Rubs or Gallops,  Murmur, regular,  radial pulses Respiratory: Clear to auscultation bilaterally, normal effort, normal  respiratory rate GI: Soft, nontender, non-distended  MS: Trace bilaterally edema;  moves all extremities Integument: Skin feels warm Neuro:  At time of evaluation, alert and oriented to person/place/time/situation  Psych: Normal affect, patient feels ok but has headache   ASSESSMENT:    1. PAF (paroxysmal atrial fibrillation) ([place])   2. Encounter for therapeutic drug monitoring    PLAN:    Paroxysmal Atrial Fibrillation DM Morbid Obesity HTN LE edema- if worsening will start low dose lasix and re-eval - Risk factors include DM - CHADSVASC=4. - will continue coumadin, but we discuss LAAO if she has new bleeding issues (Cost issues with eliquis) - continue metoprolol succinate - continue pravastatin for now - continue verapamil - start ambulatory BP monitoring - prior admission in 6/23 wasn't an TIA, but if she has stroke will need more aggressive LDL mgmt   One year with me     Medication Adjustments/Labs and Tests Ordered: Current medicines are reviewed at length with the patient today.  Concerns regarding medicines are outlined above.  Orders Placed This Encounter  Procedures   CBC   EKG 12-Lead   No orders of  the defined types were placed in this encounter.   Patient Instructions   Medication Instructions:  Your physician recommends that you continue on your current medications as directed. Please refer to the Current Medication list given to you today.  *If you need a refill on your cardiac medications before your next appointment, please call your pharmacy*   Lab Work: ON 03/13/21: CBC If you have labs (blood work) drawn today and your tests are completely normal, you will receive your results only by: [place] (if you have MyChart) OR A paper copy in the mail If you have any lab test that is abnormal or we need to change your treatment, we will call you to review the results.   Testing/Procedures: NONE   Follow-Up: At [facility], you and your health needs are our priority.  As part of our continuing mission to provide you with exceptional heart care, we have created designated Provider Care Teams.  These Care Teams include your primary Cardiologist (physician) and Advanced Practice Providers (APPs -  Physician Assistants and Nurse Practitioners) who all work together to provide you with the care you need, when you need it.   Your next appointment:   1 year(s)  The format for your next appointment:   In Person  Provider:   Werner Lean, MD       Signed, Werner Lean, MD  03/03/2021 5:17 PM    [place]

## 2021-03-03 NOTE — Patient Instructions (Signed)
Medication Instructions:  Your physician recommends that you continue on your current medications as directed. Please refer to the Current Medication list given to you today.  *If you need a refill on your cardiac medications before your next appointment, please call your pharmacy*   Lab Work: ON 03/13/21: CBC If you have labs (blood work) drawn today and your tests are completely normal, you will receive your results only by: [place] (if you have MyChart) OR A paper copy in the mail If you have any lab test that is abnormal or we need to change your treatment, we will call you to review the results.   Testing/Procedures: NONE   Follow-Up: At [facility], you and your health needs are our priority.  As part of our continuing mission to provide you with exceptional heart care, we have created designated Provider Care Teams.  These Care Teams include your primary Cardiologist (physician) and Advanced Practice Providers (APPs -  Physician Assistants and Nurse Practitioners) who all work together to provide you with the care you need, when you need it.   Your next appointment:   1 year(s)  The format for your next appointment:   In Person  Provider:   Werner Lean, MD

## 2021-03-06 ENCOUNTER — Ambulatory Visit: Payer: Medicare Other | Admitting: Psychology

## 2021-03-13 ENCOUNTER — Ambulatory Visit: Payer: Medicare Other

## 2021-03-13 ENCOUNTER — Other Ambulatory Visit: Payer: Self-pay

## 2021-03-13 ENCOUNTER — Other Ambulatory Visit: Payer: Medicare Other

## 2021-03-13 DIAGNOSIS — Z5181 Encounter for therapeutic drug level monitoring: Secondary | ICD-10-CM

## 2021-03-13 DIAGNOSIS — I480 Paroxysmal atrial fibrillation: Secondary | ICD-10-CM

## 2021-03-13 LAB — CBC
Hematocrit: 40 % (ref 34.0–46.6)
Hemoglobin: 13.9 g/dL (ref 11.1–15.9)
MCH: 30.2 pg (ref 26.6–33.0)
MCHC: 34.8 g/dL (ref 31.5–35.7)
MCV: 87 fL (ref 79–97)
Platelets: 193 10*3/uL (ref 150–450)
RBC: 4.6 x10E6/uL (ref 3.77–5.28)
RDW: 15 % (ref 11.7–15.4)
WBC: 5.5 10*3/uL (ref 3.4–10.8)

## 2021-03-13 LAB — POCT INR: INR: 1.6 — AB (ref 2.0–3.0)

## 2021-03-13 NOTE — Patient Instructions (Signed)
-   take extra 1/2 tablet tonight and tomorrow, then  - Continue taking Warfarin 1.5 tablets everyday except 2 tablets on Mondays and Fridays.  - Recheck INR in 3 weeks.  Call with any medication changes or if you are scheduled for surgery [facility] [phone] Main [phone]

## 2021-03-15 ENCOUNTER — Ambulatory Visit (INDEPENDENT_AMBULATORY_CARE_PROVIDER_SITE_OTHER): Payer: Medicare Other | Admitting: Psychology

## 2021-03-15 DIAGNOSIS — F3181 Bipolar II disorder: Secondary | ICD-10-CM | POA: Diagnosis not present

## 2021-03-15 NOTE — Progress Notes (Unsigned)
Progress Note Date: 03/15/2021  Treatment Plan: Diagnosis F31.81 (Bipolar II disorder) [n/a]  Symptoms Depressed or irritable mood. (Status: maintained) -- No Description Entered  Lack of energy. (Status: maintained) -- No Description Entered  Social withdrawal. (Status: maintained) -- No Description Entered  Medication Status compliance  Safety none  If Suicidal or Homicidal State Action Taken: unspecified  Current Risk: low Medications Abilify (Dosage: 30mg )  Hydocodone (Dosage: 7.5mg )  Lamotrigine (Dosage: 200mg /day)  Lithium (Dosage: 450mg /day)  Seroquel (Dosage: 200mg /day)  Objectives Related Problem: Develop healthy cognitive patterns and beliefs about self and the world that lead to alleviation and help prevent the relapse of mood episodes. Description: Verbalize grief, fear, and anger regarding real or imagined losses in life. Target Date: 2021-04-11 Frequency: Daily Modality: individual Progress: 60%  Related Problem: Develop healthy cognitive patterns and beliefs about self and the world that lead to alleviation and help prevent the relapse of mood episodes. Description: Take prescribed medications as directed. Target Date: 2021-04-11 Frequency: Daily Modality: individual Progress: 80%  Related Problem: Develop healthy cognitive patterns and beliefs about self and the world that lead to alleviation and help prevent the relapse of mood episodes. Description: Describe mood state, energy level, amount of control over thoughts, and sleeping pattern. Target Date: 2021-04-11 Frequency: Daily Modality: individual Progress: 50%  Client Response full compliance  Service Location Location, [name] [place], [place] [address]  Service Code cpt [phone]  Neuropsych. testing  Related past to present  Self-monitoring  Self care activities  Emotion regulation skills  Validate/empathize  Facilitate problem solving  Normalize/Reframe  Journaling  Comments   Dx.: F31.81  Goals: States she is seeking emotional stability. She needs guidance in managing some of her physical/medical challenges and limitations. Also, have some interpersonal challenges and seeks feedback on addressing these difficulties. Target date is 2-23.  Meds: Seroquel (200mg  daily), Abilify 20mg , Hydrocodone (7.5 mg up to 3x/day)  Patient requests a video session due to the Coronavirus. She is at home and I am at my home office.   Felecia Shelling, PhD Time: 1:10-2:00 50 minutes               [name], PhD

## 2021-03-15 NOTE — Progress Notes (Unsigned)
Rechy Akeia Perot is a 71 y.o. female patient   Date: 03/15/2021  Treatment Plan: Diagnosis F31.81 (Bipolar II disorder) [n/a]  Symptoms Depressed or irritable mood. (Status: maintained) -- No Description Entered  Lack of energy. (Status: maintained) -- No Description Entered  Social withdrawal. (Status: maintained) -- No Description Entered  Medication Status compliance  Safety none  If Suicidal or Homicidal State Action Taken: unspecified  Current Risk: low Medications Abilify (Dosage: 63m)  Hydocodone (Dosage: 7.518m  Lamotrigine (Dosage: 20042may)  Lithium (Dosage: 450m57my)  Seroquel (Dosage: 200mg20m)  Objectives Related Problem: Develop healthy cognitive patterns and beliefs about self and the world that lead to alleviation and help prevent the relapse of mood episodes. Description: Verbalize grief, fear, and anger regarding real or imagined losses in life. Target Date: 2021-04-11 Frequency: Daily Modality: individual Progress: 70%  Related Problem: Develop healthy cognitive patterns and beliefs about self and the world that lead to alleviation and help prevent the relapse of mood episodes. Description: Take prescribed medications as directed. Target Date: 2021-04-11 Frequency: Daily Modality: individual Progress: 100%  Related Problem: Develop healthy cognitive patterns and beliefs about self and the world that lead to alleviation and help prevent the relapse of mood episodes. Description: Describe mood state, energy level, amount of control over thoughts, and sleeping pattern. Target Date: 2021-04-11 Frequency: Daily Modality: individual Progress: 70%  Client Response full compliance  Service Location Location, [address] [id number] Code cpt [id number]. testing  Related past to present  Self-monitoring  Self care activities  Emotion regulation skills  Validate/empathize  Facilitate problem solving   Normalize/Reframe  Journaling  Comments  Dx.: F31.81  Goals: States she is seeking emotional stability. She needs guidance in managing some of her physical/medical challenges and limitations. Also, have some interpersonal challenges and seeks feedback on addressing these difficulties. Target date is 2-23.  Meds: Seroquel (200mg 45my), Abilify 20mg, 14mocodone (7.5 mg up to 3x/day)  Patient requests a video session due to the Coronavirus. She is at home and I am at my home office.   Kattaleya Dairahristmas went well and [place] brother got along well, which was a relief. Her only negative with regard to the holiday is that she "way over spent", and now is financially stressed. She claims she gets into a mode af feeling out of control with spending.  She met with Dr. Hewitt Doran Durand told her that her foot looked good and she was released.She also saw her new cardiologist and was pleased. She has started to go to bible study at church. This is a very positive move for her to get out and interact with others.  She will see Dr. Cottle Clovis Purst week of March, but is not anticipating any medication changes. She has a number of concerns that are bothering her, particularly financial. Talked strategies to manage her money.        GUTTERMMarcelina Morelime: 1:10-2:00 50 minutes

## 2021-03-16 ENCOUNTER — Other Ambulatory Visit: Payer: Self-pay | Admitting: Family Medicine

## 2021-03-16 DIAGNOSIS — M810 Age-related osteoporosis without current pathological fracture: Secondary | ICD-10-CM

## 2021-03-21 DIAGNOSIS — M47816 Spondylosis without myelopathy or radiculopathy, lumbar region: Secondary | ICD-10-CM | POA: Diagnosis not present

## 2021-03-21 DIAGNOSIS — G894 Chronic pain syndrome: Secondary | ICD-10-CM | POA: Diagnosis not present

## 2021-03-21 DIAGNOSIS — Z981 Arthrodesis status: Secondary | ICD-10-CM | POA: Diagnosis not present

## 2021-03-21 DIAGNOSIS — M961 Postlaminectomy syndrome, not elsewhere classified: Secondary | ICD-10-CM | POA: Diagnosis not present

## 2021-03-24 ENCOUNTER — Other Ambulatory Visit: Payer: Self-pay | Admitting: Cardiovascular Disease

## 2021-03-29 ENCOUNTER — Ambulatory Visit (INDEPENDENT_AMBULATORY_CARE_PROVIDER_SITE_OTHER): Payer: Medicare Other | Admitting: Psychology

## 2021-03-29 DIAGNOSIS — F3181 Bipolar II disorder: Secondary | ICD-10-CM | POA: Diagnosis not present

## 2021-03-29 NOTE — Progress Notes (Signed)
Meredith [name] is a 71 y.o. female patient   Date: 03/29/2021  Treatment Plan: Diagnosis F31.81 (Bipolar II disorder) [n/a]  Symptoms Depressed or irritable mood. (Status: maintained) -- No Description Entered  Lack of energy. (Status: maintained) -- No Description Entered  Social withdrawal. (Status: maintained) -- No Description Entered  Medication Status compliance  Safety none  If Suicidal or Homicidal State Action Taken: unspecified  Current Risk: low Medications Abilify (Dosage: 30mg )  Hydocodone (Dosage: 7.5mg )  Lamotrigine (Dosage: 200mg /day)  Lithium (Dosage: 450mg /day)  Seroquel (Dosage: 200mg /day)  Objectives Related Problem: Develop healthy cognitive patterns and beliefs about self and the world that lead to alleviation and help prevent the relapse of mood episodes. Description: Verbalize grief, fear, and anger regarding real or imagined losses in life. Target Date: 2021-04-11 Frequency: Daily Modality: individual Progress: 70%  Related Problem: Develop healthy cognitive patterns and beliefs about self and the world that lead to alleviation and help prevent the relapse of mood episodes. Description: Take prescribed medications as directed. Target Date: 2021-04-11 Frequency: Daily Modality: individual Progress: 100%  Related Problem: Develop healthy cognitive patterns and beliefs about self and the world that lead to alleviation and help prevent the relapse of mood episodes. Description: Describe mood state, energy level, amount of control over thoughts, and sleeping pattern. Target Date: 2021-04-11 Frequency: Daily Modality: individual Progress: 70%  Client Response full compliance  Service Location Location, [name] [place], [place] [address]  Service Code cpt [phone] P Neuropsych. testing  Related past to present  Self-monitoring  Self care activities  Emotion regulation skills  Validate/empathize  Facilitate problem solving   Normalize/Reframe  Journaling  Comments  Dx.: F31.81  Goals: States she is seeking emotional stability. She needs guidance in managing some of her physical/medical challenges and limitations. Also, have some interpersonal challenges and seeks feedback on addressing these difficulties. Target date is 2-23.  Meds: Seroquel (200mg  daily), Abilify 20mg , Hydrocodone (7.5 mg up to 3x/day)  Patient requests a video session due to the Coronavirus. She is at home and I am at my home office.   Meredith [name] says she has been going to bible study at church each week. She says she was very pleased to get a tax return. She has a number of expenses and it is causing her stress. She plans to see Dr. Nelida Meuse next week (virtually).       She reports feeling stable with no significant swings. Continues to be compliant with medicine. Meredith Morel, PhD Time: 1:10-2:00 50 minutes                       Meredith Morel, PhD

## 2021-03-31 DIAGNOSIS — Z20828 Contact with and (suspected) exposure to other viral communicable diseases: Secondary | ICD-10-CM | POA: Diagnosis not present

## 2021-03-31 DIAGNOSIS — Z1152 Encounter for screening for COVID-19: Secondary | ICD-10-CM | POA: Diagnosis not present

## 2021-04-03 DIAGNOSIS — G518 Other disorders of facial nerve: Secondary | ICD-10-CM | POA: Diagnosis not present

## 2021-04-03 DIAGNOSIS — M791 Myalgia, unspecified site: Secondary | ICD-10-CM | POA: Diagnosis not present

## 2021-04-03 DIAGNOSIS — M542 Cervicalgia: Secondary | ICD-10-CM | POA: Diagnosis not present

## 2021-04-03 DIAGNOSIS — G43719 Chronic migraine without aura, intractable, without status migrainosus: Secondary | ICD-10-CM | POA: Diagnosis not present

## 2021-04-04 ENCOUNTER — Other Ambulatory Visit: Payer: Self-pay

## 2021-04-04 ENCOUNTER — Ambulatory Visit: Payer: Medicare Other | Admitting: *Deleted

## 2021-04-04 DIAGNOSIS — Z5181 Encounter for therapeutic drug level monitoring: Secondary | ICD-10-CM

## 2021-04-04 DIAGNOSIS — I480 Paroxysmal atrial fibrillation: Secondary | ICD-10-CM | POA: Diagnosis not present

## 2021-04-04 LAB — POCT INR: INR: 1.7 — AB (ref 2.0–3.0)

## 2021-04-04 NOTE — Patient Instructions (Signed)
Description   Tonight take extra 1/2 tablet then start taking Warfarin 1.5 tablets everyday except 2 tablets on Mondays, Wednesdays, and Fridays. Recheck INR in 3 weeks. Call with any medication changes or if you are scheduled for surgery [facility] [phone] Main [phone]

## 2021-04-07 DIAGNOSIS — Z889 Allergy status to unspecified drugs, medicaments and biological substances status: Secondary | ICD-10-CM | POA: Diagnosis not present

## 2021-04-07 DIAGNOSIS — G43719 Chronic migraine without aura, intractable, without status migrainosus: Secondary | ICD-10-CM | POA: Diagnosis not present

## 2021-04-07 DIAGNOSIS — R748 Abnormal levels of other serum enzymes: Secondary | ICD-10-CM | POA: Diagnosis not present

## 2021-04-10 DIAGNOSIS — Z0000 Encounter for general adult medical examination without abnormal findings: Secondary | ICD-10-CM | POA: Diagnosis not present

## 2021-04-10 DIAGNOSIS — R7303 Prediabetes: Secondary | ICD-10-CM | POA: Diagnosis not present

## 2021-04-10 DIAGNOSIS — E7800 Pure hypercholesterolemia, unspecified: Secondary | ICD-10-CM | POA: Diagnosis not present

## 2021-04-10 DIAGNOSIS — I700 Atherosclerosis of aorta: Secondary | ICD-10-CM | POA: Diagnosis not present

## 2021-04-12 ENCOUNTER — Ambulatory Visit (INDEPENDENT_AMBULATORY_CARE_PROVIDER_SITE_OTHER): Payer: Medicare Other | Admitting: Psychology

## 2021-04-12 DIAGNOSIS — F3181 Bipolar II disorder: Secondary | ICD-10-CM

## 2021-04-12 NOTE — Progress Notes (Signed)
° ° ° ° ° ° ° ° ° ° ° ° ° ° °  Meredith [name] is a 71 y.o. female patient   Date: 04/12/2021  Treatment Plan: Diagnosis F31.81 (Bipolar II disorder) [n/a]  Symptoms Depressed or irritable mood. (Status: maintained) -- No Description Entered  Lack of energy. (Status: maintained) -- No Description Entered  Social withdrawal. (Status: maintained) -- No Description Entered  Medication Status compliance  Safety none  If Suicidal or Homicidal State Action Taken: unspecified  Current Risk: low Medications Abilify (Dosage: 30mg )  Hydocodone (Dosage: 7.5mg )  Lamotrigine (Dosage: 200mg /day)  Lithium (Dosage: 450mg /day)  Seroquel (Dosage: 200mg /day)  Objectives Related Problem: Develop healthy cognitive patterns and beliefs about self and the world that lead to alleviation and help prevent the relapse of mood episodes. Description: Verbalize grief, fear, and anger regarding real or imagined losses in life. Target Date: 2021-04-11 Frequency: Daily Modality: individual Progress: 70%  Related Problem: Develop healthy cognitive patterns and beliefs about self and the world that lead to alleviation and help prevent the relapse of mood episodes. Description: Take prescribed medications as directed. Target Date: 2021-04-11 Frequency: Daily Modality: individual Progress: 100%  Related Problem: Develop healthy cognitive patterns and beliefs about self and the world that lead to alleviation and help prevent the relapse of mood episodes. Description: Describe mood state, energy level, amount of control over thoughts, and sleeping pattern. Target Date: 2021-04-11 Frequency: Daily Modality: individual Progress: 70%  Client Response full compliance  Service Location Location, [name] [place], [place] [address]  Service Code cpt [id number] P Neuropsych. testing  Related past to present  Self-monitoring  Self care activities  Emotion regulation skills  Validate/empathize   Facilitate problem solving  Normalize/Reframe  Journaling  Comments  Dx.: F31.81  Goals: States she is seeking emotional stability. She needs guidance in managing some of her physical/medical challenges and limitations. Also, have some interpersonal challenges and seeks feedback on addressing these difficulties. Target date is 2-23.  Meds: Seroquel (200mg  daily), Abilify 20mg , Hydrocodone (7.5 mg up to 3x/day)  Patient requests a video session due to the Coronavirus. She is at home and I am at my home office.   Sheyann says she found out today that her sister, who is in a nursing home in [place] died today of sepsis. She was 71. She and [name] will go to the funeral, which will be large. She had her virtual visit with Dr Hinton Rao, who is an immunologist. They are doing more tests to determine diagnosis. She says she is having a lot of anxiety due to these issues, coupled with her financial problems and issues in her apartment. She feels on verge of tears, but is acting very stoic.She says she is coping and getting by, but it is a struggle. Will see Dr. Clovis Pu in about 3 weeks.      [name], PhD Time: 1:10-2:00 50 minutes

## 2021-04-20 DIAGNOSIS — Z0000 Encounter for general adult medical examination without abnormal findings: Secondary | ICD-10-CM | POA: Diagnosis not present

## 2021-04-20 DIAGNOSIS — E7800 Pure hypercholesterolemia, unspecified: Secondary | ICD-10-CM | POA: Diagnosis not present

## 2021-04-20 DIAGNOSIS — R350 Frequency of micturition: Secondary | ICD-10-CM | POA: Diagnosis not present

## 2021-04-20 DIAGNOSIS — N183 Chronic kidney disease, stage 3 unspecified: Secondary | ICD-10-CM | POA: Diagnosis not present

## 2021-04-20 DIAGNOSIS — D4709 Other mast cell neoplasms of uncertain behavior: Secondary | ICD-10-CM | POA: Diagnosis not present

## 2021-04-22 ENCOUNTER — Other Ambulatory Visit: Payer: Self-pay | Admitting: Internal Medicine

## 2021-04-25 ENCOUNTER — Other Ambulatory Visit: Payer: Self-pay

## 2021-04-25 ENCOUNTER — Ambulatory Visit: Payer: Medicare Other | Admitting: *Deleted

## 2021-04-25 DIAGNOSIS — I480 Paroxysmal atrial fibrillation: Secondary | ICD-10-CM | POA: Diagnosis not present

## 2021-04-25 DIAGNOSIS — Z5181 Encounter for therapeutic drug level monitoring: Secondary | ICD-10-CM

## 2021-04-25 LAB — POCT INR: INR: 2.5 (ref 2.0–3.0)

## 2021-04-25 NOTE — Patient Instructions (Addendum)
Description   Continue taking Warfarin 1.5 tablets everyday except 2 tablets on Mondays, Wednesdays, and Fridays. Recheck INR in 5 weeks. Call with any medication changes or if you are scheduled for surgery [facility] [phone] Main [phone]

## 2021-04-26 ENCOUNTER — Ambulatory Visit (INDEPENDENT_AMBULATORY_CARE_PROVIDER_SITE_OTHER): Payer: Medicare Other | Admitting: Psychology

## 2021-04-26 DIAGNOSIS — F3181 Bipolar II disorder: Secondary | ICD-10-CM | POA: Diagnosis not present

## 2021-04-26 NOTE — Progress Notes (Addendum)
? ? ? ? ? ? ? ? ? ? ? ? ? ?  Meredith [name] is a 71 y.o. female patient  ? ?Date: 04/26/2021  ?Treatment Plan: ?Diagnosis ?F31.81 (Bipolar II disorder) [n/a]  ?Symptoms ?Depressed or irritable mood. (Status: maintained) -- No Description Entered  ?Lack of energy. (Status: maintained) -- No Description Entered  ?Social withdrawal. (Status: maintained) -- No Description Entered  ?Medication Status ?compliance  ?Safety ?none  ?If Suicidal or Homicidal State Action Taken: unspecified  ?Current Risk: low ?Medications ?Abilify (Dosage: 30mg )  ?Hydocodone (Dosage: 7.5mg )  ?Lamotrigine (Dosage: 200mg /day)  ?Lithium (Dosage: 450mg /day)  ?Seroquel (Dosage: 200mg /day)  ?Objectives ?Related Problem: Develop healthy cognitive patterns and beliefs about self and the world that lead to alleviation and help prevent the relapse of mood episodes. ?Description: Verbalize grief, fear, and anger regarding real or imagined losses in life. ?Target Date: 2022-02-08 ?Frequency: Daily ?Modality: individual ?Progress: 70% ? ?Related Problem: Develop healthy cognitive patterns and beliefs about self and the world that lead to alleviation and help prevent the relapse of mood episodes. ?Description: Take prescribed medications as directed. ?Target Date: 2022-02-08 ?Frequency: Daily ?Modality: individual ?Progress: 90% ? ?Related Problem: Develop healthy cognitive patterns and beliefs about self and the world that lead to alleviation and help prevent the relapse of mood episodes. ?Description: Describe mood state, energy level, amount of control over thoughts, and sleeping pattern. ?Target Date: 2022-02-08 ?Frequency: Daily ?Modality: individual ?Progress: 70% ? ?Client Response ?full compliance  ?Service Location ?Location, [name] [place], [place] [address]  ?Service Code ?cpt [id number] P ?Neuropsych. testing  ?Related past to present  ?Self-monitoring  ?Self care activities  ?Emotion regulation skills  ?Validate/empathize  ?Facilitate  problem solving  ?Normalize/Reframe  ?Journaling  ?Comments  ?Dx.: F31.81  ?Goals: States she is seeking emotional stability. She needs guidance in managing some of her physical/medical challenges and limitations. Also, have some interpersonal challenges and seeks feedback on addressing these difficulties. Target date is 12-23.  ?Meds: Seroquel (200mg  daily), Abilify 20mg , Hydrocodone (7.5 mg up to 3x/day)  ?Patient requests a video session due to the Coronavirus. She is at home and I am at my home office.  ? ?Meredith [name] says she is feeling "so so". She has "a lot going on". She and brother went to [place] for sister's "viewing". There will be a memorial service at a later time. This has Meredith [name] thinking of her own funeral and starting to make plans. Does not want to burden her brother or children.  ?She had her yearly check up for Medicare last week. She said she still had not heard from her immunologist, so she had the primary care doctor take her Tryptase level. It was high and she had results sent to immunologist.       ? ? ?[name], PhD Time: 1:10-2:00 50 minutes ? ? ? ? ? ? ? ? ? ? ? ? ? ? ? ? ? ? ? ? ? ? ? ?

## 2021-05-04 ENCOUNTER — Other Ambulatory Visit: Payer: Self-pay

## 2021-05-04 ENCOUNTER — Encounter: Payer: Self-pay | Admitting: Psychiatry

## 2021-05-04 ENCOUNTER — Ambulatory Visit (INDEPENDENT_AMBULATORY_CARE_PROVIDER_SITE_OTHER): Payer: Medicare Other | Admitting: Psychiatry

## 2021-05-04 DIAGNOSIS — F4001 Agoraphobia with panic disorder: Secondary | ICD-10-CM | POA: Diagnosis not present

## 2021-05-04 DIAGNOSIS — F411 Generalized anxiety disorder: Secondary | ICD-10-CM

## 2021-05-04 DIAGNOSIS — F3181 Bipolar II disorder: Secondary | ICD-10-CM | POA: Diagnosis not present

## 2021-05-04 DIAGNOSIS — F4310 Post-traumatic stress disorder, unspecified: Secondary | ICD-10-CM

## 2021-05-04 DIAGNOSIS — F5105 Insomnia due to other mental disorder: Secondary | ICD-10-CM

## 2021-05-04 DIAGNOSIS — Z8669 Personal history of other diseases of the nervous system and sense organs: Secondary | ICD-10-CM

## 2021-05-04 DIAGNOSIS — F3132 Bipolar disorder, current episode depressed, moderate: Secondary | ICD-10-CM

## 2021-05-04 DIAGNOSIS — G3184 Mild cognitive impairment, so stated: Secondary | ICD-10-CM

## 2021-05-04 MED ORDER — ARIPIPRAZOLE 30 MG PO TABS: 30.0000 mg | ORAL_TABLET | Freq: Every day | ORAL | 1 refills | Status: DC

## 2021-05-04 MED ORDER — QUETIAPINE FUMARATE 200 MG PO TABS
200.0000 mg | ORAL_TABLET | Freq: Every day | ORAL | 0 refills | Status: DC
Start: 2021-05-04 — End: 2021-08-23

## 2021-05-04 NOTE — Progress Notes (Signed)
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 (200 mg total) by mouth at bedtime.  History of lithium toxicity Hx repeated bouts of encephalopathy  Greater than 50% of 30 min face to face time with patient was spent on counseling and coordination of care. We discussed the following.  Elzie is a chronically mentally ill patient with chronic depression and chronic anxiety and multiple med failures.  [facility] for acute encephalopathy 07/2020.  The cause was unknown but presumed to be medication related.  Patient denies ever taking any medications including not ever taking hydrocodone.  She has had 3 episodes over the last couple of years.  1 was possibly related to lithium toxicity but the other 2 do not appear related.  She claims compliance with medication and that she is using a pillbox for everything except the opiate.  Discussed that she could be possibly overtaking the opiate at times and not realize it.  She denies doing this.  The 3 medications that were stopped at the hospital,  Abilify, lamotrigine and lithium in this case were highly unlikely to cause acute encephalopathy.  So the cause for this episode is unknown.   The patient has not been stable psychiatrically on low-dose quetiapine in the past and is highly likely to relapse into some version of either depression or mania or mixed episode.    Current psych meds Abilify 20 and Seroquel 200 HS are not preventing mood cycling. Increase Abilify back to 30 mg daily (less SE than Seroquel increase) Continue Seroquel 200 mg HS. This has not been suffiecient to maintan mood  Using pill box to keep up with meds.  Using pillbox to help compliance.  Disc danger of  mixing up or doubling up meds.  She fills box herself.  Rec she get help with this. Cannot afford branded meds which prevents [place] from using some of the bipolar depression meds like Latuda, Vraylar.  She might have unrealistic expectations to sleep 9 hours.  Disc tolerance.  Push fluids.  Has to remind herself..  Disc ways to talk to brother about helping her.   Discussed potential metabolic side effects associated with atypical antipsychotics, as well as potential risk for movement side effects. Advised pt to contact office if movement side effects occur.   OK Ativan prn for dental procedure 1-2 mg.  No driving after it.  PCP Elyn Peers, [facility] Bal  Continue therapy with Dr. Cheryln Manly q 2 weeks. Consider better medication coverage when gets new insurance Discussed this issue with her again although I think she again shows a cheaper plan which will limit medication options.  Follow-up 2 mos  Lynder Parents MD, DFAPA  Please see After Visit Summary for patient specific instructions.  Future Appointments  Date Time Provider [place]  05/10/2021  1:00 PM Oren Binet, PhD [facility] None  05/24/2021  1:00 PM Oren Binet, PhD [facility] None  05/29/2021  1:30 PM [facility]  06/07/2021  1:00  PM Oren Binet, PhD [facility] None  06/21/2021  1:00 PM Oren Binet, PhD [facility] None  07/05/2021  1:00 PM Oren Binet, PhD [facility] None  07/19/2021  1:00 PM Oren Binet, PhD [facility] None  08/02/2021  1:00 PM Oren Binet, PhD [facility] None  08/15/2021  2:00 PM [facility] DX DEXA 1 [facility]  08/16/2021  1:00 PM Oren Binet, PhD [facility] None  08/30/2021  1:00 PM Oren Binet, PhD [facility] None  09/13/2021  1:00 PM Oren Binet, PhD [facility] None  09/27/2021  1:00 PM Oren Binet, PhD [facility] None  10/11/2021  1:00 PM Oren Binet, PhD [facility] None    No orders of the defined types were placed in this encounter.      -------------------------------

## 2021-05-10 ENCOUNTER — Ambulatory Visit (INDEPENDENT_AMBULATORY_CARE_PROVIDER_SITE_OTHER): Payer: Medicare Other | Admitting: Psychology

## 2021-05-10 DIAGNOSIS — Z889 Allergy status to unspecified drugs, medicaments and biological substances status: Secondary | ICD-10-CM | POA: Diagnosis not present

## 2021-05-10 DIAGNOSIS — G43719 Chronic migraine without aura, intractable, without status migrainosus: Secondary | ICD-10-CM | POA: Diagnosis not present

## 2021-05-10 DIAGNOSIS — F3181 Bipolar II disorder: Secondary | ICD-10-CM

## 2021-05-10 DIAGNOSIS — R748 Abnormal levels of other serum enzymes: Secondary | ICD-10-CM | POA: Diagnosis not present
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
## 2021-05-12 DIAGNOSIS — Z20822 Contact with and (suspected) exposure to covid-19: Secondary | ICD-10-CM | POA: Diagnosis not present

## 2021-05-16 DIAGNOSIS — Z79891 Long term (current) use of opiate analgesic: Secondary | ICD-10-CM | POA: Diagnosis not present

## 2021-05-18 ENCOUNTER — Other Ambulatory Visit: Payer: Self-pay | Admitting: Cardiovascular Disease

## 2021-05-18 NOTE — Telephone Encounter (Signed)
Prescription refill request received for warfarin ?Lov: 03/03/21 (Chasndrasekhar) ?Next INR check: 05/29/21 ?Warfarin tablet strength: 2.'5mg'$  ? ?Appropriate dose and refill sent to requested pharmacy.  ?

## 2021-05-24 ENCOUNTER — Ambulatory Visit (INDEPENDENT_AMBULATORY_CARE_PROVIDER_SITE_OTHER): Payer: Medicare Other | Admitting: Psychology

## 2021-05-24 DIAGNOSIS — F3181 Bipolar II disorder: Secondary | ICD-10-CM
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
## 2021-06-07 ENCOUNTER — Ambulatory Visit (INDEPENDENT_AMBULATORY_CARE_PROVIDER_SITE_OTHER): Payer: Medicare Other | Admitting: Psychology

## 2021-06-07 DIAGNOSIS — F3181 Bipolar II disorder: Secondary | ICD-10-CM

## 2021-06-07 NOTE — Progress Notes (Signed)
? ? ? ? ? ? ? ? ? ? ? ? ? ? ? ? ? ? ? ? ? ? ? ? ? ? ? ? ? ? ? ? ? ? ? ? ? ? ?  Meredith [name] is a 71 y.o. female patient  ? ?Date: 06/07/2021  ?Treatment Plan: ?Diagnosis ?F31.81 (Bipolar II disorder) [n/a]  ?Symptoms ?Depressed or irritable mood. (Status: maintained) -- No Description Entered  ?Lack of energy. (Status: maintained) -- No Description Entered  ?Social withdrawal. (Status: maintained) -- No Description Entered  ?Medication Status ?compliance  ?Safety ?none  ?If Suicidal or Homicidal State Action Taken: unspecified  ?Current Risk: low ?Medications ?Abilify (Dosage: '30mg'$ )  ?Hydocodone (Dosage: 7.'5mg'$ )  ?Lamotrigine (Dosage: '200mg'$ /day)  ?Lithium (Dosage: '450mg'$ /day)  ?Seroquel (Dosage: '200mg'$ /day)  ?Objectives ?Related Problem: Develop healthy cognitive patterns and beliefs about self and the world that lead to alleviation and help prevent the relapse of mood episodes. ?Description: Verbalize grief, fear, and anger regarding real or imagined losses in life. ?Target Date: 2022-02-08 ?Frequency: Daily ?Modality: individual ?Progress: 70% ? ?Related Problem: Develop healthy cognitive patterns and beliefs about self and the world that lead to alleviation and help prevent the relapse of mood episodes. ?Description: Take prescribed medications as directed. ?Target Date: 2022-02-08 ?Frequency: Daily ?Modality: individual ?Progress: 90% ? ?Related Problem: Develop healthy cognitive patterns and beliefs about self and the world that lead to alleviation and help prevent the relapse of mood episodes. ?Description: Describe mood state, energy level, amount of control over thoughts, and sleeping pattern. ?Target Date: 2022-02-08 ?Frequency: Daily ?Modality: individual ?Progress: 70% ? ?Client Response ?full compliance  ?Service Location ?Location, [name] [place], [place] [address]  ?Service Code ?cpt W4176370 P ?Neuropsych. testing  ?Related past to present  ?Self-monitoring  ?Self care activities  ?Emotion  regulation skills  ?Validate/empathize  ?Facilitate problem solving  ?Normalize/Reframe  ?Journaling  ?Comments  ?Dx.: F31.81  ?Goals: States she is seeking emotional stability. She needs guidance in managing some of her physical/medical challenges and limitations. Also, have some interpersonal challenges and seeks feedback on addressing these difficulties. Target date is 12-23.  ?Meds: Seroquel ('200mg'$  daily), Abilify '20mg'$ , Hydrocodone (7.5 mg up to 3x/day)  ?Patient requests a video session due to the Coronavirus. She is at home and I am at my home office.  ? ?Cahterine has been sick for the past 13 days, but is starting to feel better. Ended up spending Easter alone. Will see Dr. Clovis Pu in about 3 weeks. She talked about her physical health which is relatively stable at this time. We talked about her lack of information about her biological parents. It has been difficult not knowing anything about them all these years. She had little interest in meeting them, but wished she had information about their medical history.  ?She reports doing well and will try to get out more once the pollen lessens (due to her allergies).            ? ? ?[name], PhD Time: 1:10-2:00 50 minutes ? ? ? ? ? ? ? ? ? ? ? ? ? ? ? ? ? ? ? ? ? ? ? ?

## 2021-06-08 ENCOUNTER — Ambulatory Visit (INDEPENDENT_AMBULATORY_CARE_PROVIDER_SITE_OTHER): Payer: Medicare Other | Admitting: *Deleted

## 2021-06-08 DIAGNOSIS — Z5181 Encounter for therapeutic drug level monitoring: Secondary | ICD-10-CM

## 2021-06-08 DIAGNOSIS — I480 Paroxysmal atrial fibrillation: Secondary | ICD-10-CM

## 2021-06-08 LAB — POCT INR: INR: 2.5 (ref 2.0–3.0)

## 2021-06-08 NOTE — Patient Instructions (Signed)
Description   Continue taking Warfarin 1.5 tablets everyday except 2 tablets on Mondays, Wednesdays, and Fridays. Recheck INR in 6 weeks. Call with any medication changes or if you are scheduled for surgery [facility] [phone] Main [phone]     

## 2021-06-12 DIAGNOSIS — M791 Myalgia, unspecified site: Secondary | ICD-10-CM | POA: Diagnosis not present

## 2021-06-12 DIAGNOSIS — G43719 Chronic migraine without aura, intractable, without status migrainosus: Secondary | ICD-10-CM | POA: Diagnosis not present

## 2021-06-12 DIAGNOSIS — M542 Cervicalgia: Secondary | ICD-10-CM | POA: Diagnosis not present

## 2021-06-12 DIAGNOSIS — G518 Other disorders of facial nerve: Secondary | ICD-10-CM | POA: Diagnosis not present

## 2021-06-13 DIAGNOSIS — M533 Sacrococcygeal disorders, not elsewhere classified: Secondary | ICD-10-CM | POA: Diagnosis not present

## 2021-06-13 DIAGNOSIS — M47816 Spondylosis without myelopathy or radiculopathy, lumbar region: Secondary | ICD-10-CM | POA: Diagnosis not present

## 2021-06-13 DIAGNOSIS — M791 Myalgia, unspecified site: Secondary | ICD-10-CM | POA: Diagnosis not present

## 2021-06-13 DIAGNOSIS — G894 Chronic pain syndrome: Secondary | ICD-10-CM | POA: Diagnosis not present

## 2021-06-13 DIAGNOSIS — Z79891 Long term (current) use of opiate analgesic: Secondary | ICD-10-CM | POA: Diagnosis not present

## 2021-06-14 DIAGNOSIS — N183 Chronic kidney disease, stage 3 unspecified: Secondary | ICD-10-CM | POA: Diagnosis not present

## 2021-06-14 DIAGNOSIS — E039 Hypothyroidism, unspecified: Secondary | ICD-10-CM | POA: Diagnosis not present

## 2021-06-14 DIAGNOSIS — I480 Paroxysmal atrial fibrillation: Secondary | ICD-10-CM | POA: Diagnosis not present

## 2021-06-14 DIAGNOSIS — E7800 Pure hypercholesterolemia, unspecified: Secondary | ICD-10-CM | POA: Diagnosis not present

## 2021-06-17 DIAGNOSIS — Z20822 Contact with and (suspected) exposure to covid-19: Secondary | ICD-10-CM | POA: Diagnosis not present

## 2021-06-19 DIAGNOSIS — D4702 Systemic mastocytosis: Secondary | ICD-10-CM | POA: Diagnosis not present

## 2021-06-19 DIAGNOSIS — M533 Sacrococcygeal disorders, not elsewhere classified: Secondary | ICD-10-CM | POA: Diagnosis not present

## 2021-06-21 ENCOUNTER — Ambulatory Visit (INDEPENDENT_AMBULATORY_CARE_PROVIDER_SITE_OTHER): Payer: Medicare Other | Admitting: Psychology

## 2021-06-21 DIAGNOSIS — F3181 Bipolar II disorder: Secondary | ICD-10-CM

## 2021-06-21 NOTE — Progress Notes (Addendum)
? ? ? ? ? ?  Meredith [name] is a 71 y.o. female patient  ? ?Date: 06/21/2021  ?Treatment Plan: ?Diagnosis ?F31.81 (Bipolar II disorder) [n/a]  ?Symptoms ?Depressed or irritable mood. (Status: maintained) -- No Description Entered  ?Lack of energy. (Status: maintained) -- No Description Entered  ?Social withdrawal. (Status: maintained) -- No Description Entered  ?Medication Status ?compliance  ?Safety ?none  ?If Suicidal or Homicidal State Action Taken: unspecified  ?Current Risk: low ?Medications ?Abilify (Dosage: '30mg'$ )  ?Hydocodone (Dosage: 7.'5mg'$ )  ?Lamotrigine (Dosage: '200mg'$ /day)  ?Lithium (Dosage: '450mg'$ /day)  ?Seroquel (Dosage: '200mg'$ /day)  ?Objectives ?Related Problem: Develop healthy cognitive patterns and beliefs about self and the world that lead to alleviation and help prevent the relapse of mood episodes. ?Description: Verbalize grief, fear, and anger regarding real or imagined losses in life. ?Target Date: 2022-02-08 ?Frequency: Daily ?Modality: individual ?Progress: 70% ? ?Related Problem: Develop healthy cognitive patterns and beliefs about self and the world that lead to alleviation and help prevent the relapse of mood episodes. ?Description: Take prescribed medications as directed. ?Target Date: 2022-02-08 ?Frequency: Daily ?Modality: individual ?Progress: 90% ? ?Related Problem: Develop healthy cognitive patterns and beliefs about self and the world that lead to alleviation and help prevent the relapse of mood episodes. ?Description: Describe mood state, energy level, amount of control over thoughts, and sleeping pattern. ?Target Date: 2022-02-08 ?Frequency: Daily ?Modality: individual ?Progress: 70% ? ?Client Response ?full compliance  ?Service Location ?Location, [name] [place], [place] [address]  ?Service Code ?cpt W4176370 P ?Neuropsych. testing  ?Related past to present  ?Self-monitoring  ?Self care activities  ?Emotion regulation skills  ?Validate/empathize  ?Facilitate problem  solving  ?Normalize/Reframe  ?Journaling  ?Comments  ?Dx.: F31.81  ?Goals: States she is seeking emotional stability. She needs guidance in managing some of her physical/medical challenges and limitations. Also, have some interpersonal challenges and seeks feedback on addressing these difficulties. Target date is 12-23.  ?Meds: Seroquel ('200mg'$  daily), Abilify '20mg'$ , Hydrocodone (7.5 mg up to 3x/day)  ?Patient requests in person session in the provider's office.  ? ?Meredith [name] had an injection in her spine and it has helped her pain. She is seen at pain clinic monthly. She said that she has signed up for another bible study class which will start soon. ?She is going to attend a place for widowed people with her brother as his guest. Her finances are tight and she is restricting expenditures. ?Her moods are stable. Will be seeing Dr. Clovis Pu in the next week or two.  ?              ? ? ?Meredith Morel, PhD Time: 1:10-2:00 50 minutes ? ? ? ? ? ? ? ? ? ? ? ? ? ? ? ? ? ? ? ? ? ? ? ?

## 2021-06-28 DIAGNOSIS — Z23 Encounter for immunization: Secondary | ICD-10-CM | POA: Diagnosis not present

## 2021-07-02 DIAGNOSIS — Z20822 Contact with and (suspected) exposure to covid-19: Secondary | ICD-10-CM | POA: Diagnosis not present

## 2021-07-05 ENCOUNTER — Other Ambulatory Visit: Payer: Self-pay | Admitting: Psychiatry

## 2021-07-05 ENCOUNTER — Ambulatory Visit (INDEPENDENT_AMBULATORY_CARE_PROVIDER_SITE_OTHER): Payer: Medicare Other | Admitting: Psychology

## 2021-07-05 DIAGNOSIS — F3181 Bipolar II disorder: Secondary | ICD-10-CM | POA: Diagnosis not present

## 2021-07-05 NOTE — Progress Notes (Signed)
? ? ? ? ? ? ? ? ? ? ? ? ? ? ? ? ? ? ? ? ?  Meredith [name] is a 71 y.o. female patient  ? ?Date: 07/05/2021  ?Treatment Plan: ?Diagnosis ?F31.81 (Bipolar II disorder) [n/a]  ?Symptoms ?Depressed or irritable mood. (Status: maintained) -- No Description Entered  ?Lack of energy. (Status: maintained) -- No Description Entered  ?Social withdrawal. (Status: maintained) -- No Description Entered  ?Medication Status ?compliance  ?Safety ?none  ?If Suicidal or Homicidal State Action Taken: unspecified  ?Current Risk: low ?Medications ?Abilify (Dosage: '30mg'$ )  ?Hydocodone (Dosage: 7.'5mg'$ )  ?Lamotrigine (Dosage: '200mg'$ /day)  ?Lithium (Dosage: '450mg'$ /day)  ?Seroquel (Dosage: '200mg'$ /day)  ?Objectives ?Related Problem: Develop healthy cognitive patterns and beliefs about self and the world that lead to alleviation and help prevent the relapse of mood episodes. ?Description: Verbalize grief, fear, and anger regarding real or imagined losses in life. ?Target Date: 2022-02-08 ?Frequency: Daily ?Modality: individual ?Progress: 70% ? ?Related Problem: Develop healthy cognitive patterns and beliefs about self and the world that lead to alleviation and help prevent the relapse of mood episodes. ?Description: Take prescribed medications as directed. ?Target Date: 2022-02-08 ?Frequency: Daily ?Modality: individual ?Progress: 90% ? ?Related Problem: Develop healthy cognitive patterns and beliefs about self and the world that lead to alleviation and help prevent the relapse of mood episodes. ?Description: Describe mood state, energy level, amount of control over thoughts, and sleeping pattern. ?Target Date: 2022-02-08 ?Frequency: Daily ?Modality: individual ?Progress: 70% ? ?Client Response ?full compliance  ?Service Location ?Location, [name] [place], [place] [address]  ?Service Code ?cpt W4176370 P ?Neuropsych. testing  ?Related past to present  ?Self-monitoring  ?Self care activities  ?Emotion regulation skills   ?Validate/empathize  ?Facilitate problem solving  ?Normalize/Reframe  ?Journaling  ?Comments  ?Dx.: F31.81  ?Goals: States she is seeking emotional stability. She needs guidance in managing some of her physical/medical challenges and limitations. Also, have some interpersonal challenges and seeks feedback on addressing these difficulties. Target date is 12-23.  ?Meds: Seroquel ('200mg'$  daily), Abilify '30mg'$ , Hydrocodone (7.5 mg up to 3x/day)  ?Patient requests in person session in the provider's office.  ? ?Meredith [name] says she is "up and down emotionally". She is gaining weight, which is a concern for her. She meets with Dr. Clovis Pu tomorrow and will discuss the possibility of a medication adjustment. Last adjustment was only to increase Abilify from 20 to 30 mg. She is having racing thoughts that interfere with getting to sleep. She sometimes will take hours until she can fall asleep. We talked about sleep hygiene and strategies to help onset of sleep. She will also discuss with Dr. Clovis Pu. She had a birthday last week. She went out to dinner with her twin brother and some friends to celebrate. She is working on improving self-care and trying to exercise more frequently.       ?              ? ? ?Meredith Morel, PhD Time: 1:10-2:00 50 minutes ? ? ? ? ? ? ? ? ? ? ? ? ? ? ? ? ? ? ? ? ? ? ? ?

## 2021-07-06 ENCOUNTER — Encounter: Payer: Self-pay | Admitting: Psychiatry

## 2021-07-06 ENCOUNTER — Ambulatory Visit (INDEPENDENT_AMBULATORY_CARE_PROVIDER_SITE_OTHER): Payer: Medicare Other | Admitting: Psychiatry

## 2021-07-06 DIAGNOSIS — F5105 Insomnia due to other mental disorder: Secondary | ICD-10-CM | POA: Diagnosis not present

## 2021-07-06 DIAGNOSIS — F319 Bipolar disorder, unspecified: Secondary | ICD-10-CM | POA: Diagnosis not present

## 2021-07-06 DIAGNOSIS — Z8669 Personal history of other diseases of the nervous system and sense organs: Secondary | ICD-10-CM | POA: Diagnosis not present

## 2021-07-06 DIAGNOSIS — F411 Generalized anxiety disorder: Secondary | ICD-10-CM

## 2021-07-06 DIAGNOSIS — F4310 Post-traumatic stress disorder, unspecified: Secondary | ICD-10-CM

## 2021-07-06 DIAGNOSIS — G3184 Mild cognitive impairment, so stated: Secondary | ICD-10-CM

## 2021-07-06 DIAGNOSIS — F4001 Agoraphobia with panic disorder: Secondary | ICD-10-CM

## 2021-07-06 MED ORDER — LURASIDONE HCL 40 MG PO TABS: 40.0000 mg | ORAL_TABLET | Freq: Every day | ORAL | 0 refills | Status: DC

## 2021-07-06 MED ORDER — TRAZODONE HCL 50 MG PO TABS: 50.0000 mg | ORAL_TABLET | Freq: Every day | ORAL | 0 refills | Status: DC
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
## 2021-07-07 ENCOUNTER — Telehealth: Payer: Self-pay | Admitting: Psychiatry

## 2021-07-07 NOTE — Telephone Encounter (Signed)
Patient had called her insurance company and this is what they told her it would cost. She just has samples, no Rx has been sent.  Dr. Clovis Pu had told her that Leda Gauze had been able to get patient assistance for another patient and she might be able to do the same for her. She will call back mid-week next week and let us know if she wants to continue.  ?

## 2021-07-07 NOTE — Telephone Encounter (Signed)
Pt called reporting the Latuda cost is $766. Pt can't afford. Contact # [phone] ?

## 2021-07-11 NOTE — Telephone Encounter (Signed)
I gave her samples to try it.  I told her it would likely be expensive but that if the samples were helpful we would attempt to get it at a discounted rate for her.  Therefore she needs to try the samples as we discussed at the appointment ?

## 2021-07-12 DIAGNOSIS — T63481D Toxic effect of venom of other arthropod, accidental (unintentional), subsequent encounter: Secondary | ICD-10-CM | POA: Diagnosis not present

## 2021-07-12 DIAGNOSIS — R748 Abnormal levels of other serum enzymes: Secondary | ICD-10-CM | POA: Diagnosis not present

## 2021-07-12 DIAGNOSIS — J300 Vasomotor rhinitis: Secondary | ICD-10-CM | POA: Diagnosis not present

## 2021-07-12 DIAGNOSIS — J3089 Other allergic rhinitis: Secondary | ICD-10-CM | POA: Diagnosis not present

## 2021-07-12 NOTE — Telephone Encounter (Signed)
Patient notified. She said she has taken for 6 days so far, has enough samples for 3 weeks. She will call the first part of next week and let us know how she is doing.  ?

## 2021-07-17 ENCOUNTER — Telehealth: Payer: Self-pay | Admitting: Psychiatry

## 2021-07-17 NOTE — Telephone Encounter (Signed)
Pt called reporting Latuda  started 12 days ago  20 mg and increased to 40 mg 5 days ago. Feeling worse. More anxiety, depression. Contact Pt @ [address] [facility] [address] [facility]

## 2021-07-17 NOTE — Telephone Encounter (Signed)
We just started Latuda 11 days ago.  She has stopped Abilify and started this medicine.  She will need to wait another couple of weeks before she can likely see improvement.  The Abilify gets out of her body slowly and will interfere with the benefit from [place].  Also we may need to go to a higher dose.  She was on a high dose of Abilify and this is a low dose of Latuda.  If she is not feeling too sleepy from it we could go ahead and increase to 60 mg or we can wait.  Also make sure she is taking it with at least 350 cal

## 2021-07-17 NOTE — Telephone Encounter (Signed)
See message from patient. She said she has increased irritability, depression (7/10), anxiety (8/10), and overall not feeling well. She takes trazodone occasionally, but struggles getting to sleep, is able to stay asleep. Denies SI. She is concerned about potential cost. She says she doesn't want to be on it if she is going to feel this way.

## 2021-07-18 DIAGNOSIS — M542 Cervicalgia: Secondary | ICD-10-CM | POA: Diagnosis not present

## 2021-07-18 DIAGNOSIS — G43719 Chronic migraine without aura, intractable, without status migrainosus: Secondary | ICD-10-CM | POA: Diagnosis not present

## 2021-07-18 DIAGNOSIS — G518 Other disorders of facial nerve: Secondary | ICD-10-CM | POA: Diagnosis not present

## 2021-07-18 DIAGNOSIS — M791 Myalgia, unspecified site: Secondary | ICD-10-CM | POA: Diagnosis not present

## 2021-07-19 ENCOUNTER — Telehealth: Payer: Self-pay | Admitting: Psychiatry

## 2021-07-19 ENCOUNTER — Ambulatory Visit (INDEPENDENT_AMBULATORY_CARE_PROVIDER_SITE_OTHER): Payer: Medicare Other | Admitting: Psychology

## 2021-07-19 DIAGNOSIS — F319 Bipolar disorder, unspecified: Secondary | ICD-10-CM

## 2021-07-19 NOTE — Telephone Encounter (Signed)
Pt called at 2:48 pm said that she has one more week of samples left. She said that her insurance said it  would cost her over 900 for the latuda. She is irritable and very depressed. She is willing to stay on the latuda as long as she has more samples. ?I told her we could do a PA for the latuda and that may help bring the cost down. Please call her at [phone]

## 2021-07-19 NOTE — Progress Notes (Signed)
Meredith [name] is a 71 y.o. female patient   Date: 07/19/2021  Treatment Plan: Diagnosis F31.81 (Bipolar II disorder) [n/a]  Symptoms Depressed or irritable mood. (Status: maintained) -- No Description Entered  Lack of energy. (Status: maintained) -- No Description Entered  Social withdrawal. (Status: maintained) -- No Description Entered  Medication Status compliance  Safety none  If Suicidal or Homicidal State Action Taken: unspecified  Current Risk: low Medications Abilify (Dosage: 44m)  Hydocodone (Dosage: 7.592m  Lamotrigine (Dosage: 20088may)  Lithium (Dosage: 450m74my)  Seroquel (Dosage: 200mg51m)  Objectives Related Problem: Develop healthy cognitive patterns and beliefs about self and the world that lead to alleviation and help prevent the relapse of mood episodes. Description: Verbalize grief, fear, and anger regarding real or imagined losses in life. Target Date: 2022-02-08 Frequency: Daily Modality: individual Progress: 70%  Related Problem: Develop healthy cognitive patterns and beliefs about self and the world that lead to alleviation and help prevent the relapse of mood episodes. Description: Take prescribed medications as directed. Target Date: 2022-02-08 Frequency: Daily Modality: individual Progress: 90%  Related Problem: Develop healthy cognitive patterns and beliefs about self and the world that lead to alleviation and help prevent the relapse of mood episodes. Description: Describe mood state, energy level, amount of control over thoughts, and sleeping pattern. Target Date: 2022-02-08 Frequency: Daily Modality: individual Progress: 70%  Client Response full compliance  Service Location [address], [address] [id number] Code cpt [id number]. testing  Related past to present  Self-monitoring  Self care activities  Emotion  regulation skills  Validate/empathize  Facilitate problem solving  Normalize/Reframe  Journaling  Comments  Dx.: F31.81  Goals: States she is seeking emotional stability. She needs guidance in managing some of her physical/medical challenges and limitations. Also, have some interpersonal challenges and seeks feedback on addressing these difficulties. Target date is 12-23.  Meds: Seroquel (200mg 59my), Abilify 30mg, 54mocodone (7.5 mg up to 3x/day)  Patient requests in person session in the provider's office.   Meredith [name] met with Dr. Cottle Clovis Pu gave her 3 weeks of Latuda 40mg. S42mays she is "struggling" and it is not helping. She feels more anxious and depressed than before. She had to stop taking Abilify. She has told Dr. Cottle'sCasimiro Needleabout her problem with this medication. Also, the cost of Latuda is almost $1,000/month. Dr. Cottle wClovis Pur to wait until the Abilify is out of her system (it can take 3-6 weeks). She is worried about the effectiveness of the medicine and the extraordinary cost.  She got a call from her youngest son Meredith [name].Annie Mainred with her that his father would not pay for his room and board to attend [employer] his resentment for not being able to take the opportunity to go there. He had a full scholarship for the tuition. He did say that he did not want her to feel bad and that he loved her. She responded feeling like a failure. Feels it brought up a lot of old painful issues for her. We discussed how to best understand her feelings and manage her emotional response. Talked about following up with Meredith [name].[name]  [name], PhD Time: 1:10-2:00 50 minutes

## 2021-07-20 ENCOUNTER — Telehealth: Payer: Self-pay

## 2021-07-20 ENCOUNTER — Ambulatory Visit (INDEPENDENT_AMBULATORY_CARE_PROVIDER_SITE_OTHER): Payer: Medicare Other

## 2021-07-20 DIAGNOSIS — Z5181 Encounter for therapeutic drug level monitoring: Secondary | ICD-10-CM

## 2021-07-20 DIAGNOSIS — I480 Paroxysmal atrial fibrillation: Secondary | ICD-10-CM | POA: Diagnosis not present

## 2021-07-20 LAB — POCT INR: INR: 2.4 (ref 2.0–3.0)

## 2021-07-20 MED ORDER — LURASIDONE HCL 40 MG PO TABS: 40.0000 mg | ORAL_TABLET | Freq: Every day | ORAL | 0 refills | Status: DC

## 2021-07-20 NOTE — Telephone Encounter (Signed)
Patient was notified. States she is taking with at least 350 calories.

## 2021-07-20 NOTE — Telephone Encounter (Signed)
Rx sent to see if it generates need for PA.

## 2021-07-20 NOTE — Patient Instructions (Signed)
Description   Continue taking Warfarin 1.5 tablets everyday except 2 tablets on Mondays, Wednesdays, and Fridays. Recheck INR in 6 weeks. Call with any medication changes or if you are scheduled for surgery [facility] [phone] Main [phone]     

## 2021-07-20 NOTE — Telephone Encounter (Signed)
Patient said she had decreased her Lyrica to 75 mg TID. This is prescribed by another provider.

## 2021-07-20 NOTE — Telephone Encounter (Signed)
Tried to submit a prior authorization for LURASIDONE 40 MG but medication is already covered under her [employer] Medicare Part D. Contacted the pharmacy and it is $613.00 with her insurance coverage. I can try to submit a Tier Reduction to see if that helps cost. Possibly do Patient Assistance. Will update Dr. Clovis Pu.

## 2021-07-21 NOTE — Telephone Encounter (Signed)
Patient said she would come on Tuesday to get samples.

## 2021-07-21 NOTE — Telephone Encounter (Signed)
Dr. Clovis Pu has said she could increase to 60 mg. I don't want you to have to duplicate your efforts.

## 2021-07-21 NOTE — Telephone Encounter (Signed)
I have some 80 mg samples and 20 mg samples left.  She can pick them up and increase Latuda to one half of an 80 mg tablet plus one of the 20 mg tablets to equal 60 mg daily.  When does she need to pick them up.  Please work on getting her tier reduction or possibly patient assistance

## 2021-07-25 DIAGNOSIS — M533 Sacrococcygeal disorders, not elsewhere classified: Secondary | ICD-10-CM | POA: Diagnosis not present

## 2021-07-25 DIAGNOSIS — G894 Chronic pain syndrome: Secondary | ICD-10-CM | POA: Diagnosis not present

## 2021-07-25 DIAGNOSIS — M791 Myalgia, unspecified site: Secondary | ICD-10-CM | POA: Diagnosis not present

## 2021-07-25 DIAGNOSIS — M47816 Spondylosis without myelopathy or radiculopathy, lumbar region: Secondary | ICD-10-CM | POA: Diagnosis not present

## 2021-07-25 DIAGNOSIS — Z79891 Long term (current) use of opiate analgesic: Secondary | ICD-10-CM | POA: Diagnosis not present

## 2021-07-28 DIAGNOSIS — R748 Abnormal levels of other serum enzymes: Secondary | ICD-10-CM | POA: Diagnosis not present

## 2021-07-28 DIAGNOSIS — Z889 Allergy status to unspecified drugs, medicaments and biological substances status: Secondary | ICD-10-CM | POA: Diagnosis not present

## 2021-08-01 ENCOUNTER — Other Ambulatory Visit: Payer: Self-pay | Admitting: Psychiatry

## 2021-08-02 ENCOUNTER — Ambulatory Visit (INDEPENDENT_AMBULATORY_CARE_PROVIDER_SITE_OTHER): Payer: Medicare Other | Admitting: Psychology

## 2021-08-02 ENCOUNTER — Other Ambulatory Visit: Payer: Self-pay | Admitting: Psychiatry

## 2021-08-02 DIAGNOSIS — F319 Bipolar disorder, unspecified: Secondary | ICD-10-CM

## 2021-08-02 DIAGNOSIS — F5105 Insomnia due to other mental disorder: Secondary | ICD-10-CM
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
## 2021-08-03 DIAGNOSIS — D4702 Systemic mastocytosis: Secondary | ICD-10-CM | POA: Diagnosis not present

## 2021-08-03 DIAGNOSIS — T783XXA Angioneurotic edema, initial encounter: Secondary | ICD-10-CM | POA: Diagnosis not present

## 2021-08-03 DIAGNOSIS — D8940 Mast cell activation, unspecified: Secondary | ICD-10-CM | POA: Diagnosis not present

## 2021-08-03 DIAGNOSIS — D8944 Hereditary alpha tryptasemia: Secondary | ICD-10-CM | POA: Diagnosis not present

## 2021-08-07 ENCOUNTER — Telehealth: Payer: Self-pay | Admitting: Psychiatry

## 2021-08-07 NOTE — Telephone Encounter (Signed)
Please review

## 2021-08-07 NOTE — Telephone Encounter (Signed)
I don't have any more samples.  If we can't get pt assistance or tier reduction she will have to stop it.

## 2021-08-07 NOTE — Telephone Encounter (Signed)
Pt called at 2:43 pm and said that she needs more samples of the latuda 20 mg. She takes 60 mg and she needs 20 mg. She said dr. Clovis Pu had some in his office. Please check with him and call her to come pick them up at [phone]

## 2021-08-08 DIAGNOSIS — M47816 Spondylosis without myelopathy or radiculopathy, lumbar region: Secondary | ICD-10-CM | POA: Diagnosis not present

## 2021-08-10 NOTE — Telephone Encounter (Signed)
I checked both med cabinets just in case and there were no samples and I did not see any samples in your [place] closet.

## 2021-08-10 NOTE — Telephone Encounter (Signed)
Meredith [name] can you check if I have any samples of Latuda in my cabinet

## 2021-08-15 ENCOUNTER — Ambulatory Visit
Admission: RE | Admit: 2021-08-15 | Discharge: 2021-08-15 | Disposition: A | Discharge status: Home / Self Care | Payer: Medicare Other | Source: Ambulatory Visit | Attending: Family Medicine | Admitting: Family Medicine

## 2021-08-15 DIAGNOSIS — Z780 Asymptomatic menopausal state: Secondary | ICD-10-CM | POA: Diagnosis not present

## 2021-08-15 DIAGNOSIS — M8589 Other specified disorders of bone density and structure, multiple sites: Secondary | ICD-10-CM | POA: Diagnosis not present

## 2021-08-15 DIAGNOSIS — M810 Age-related osteoporosis without current pathological fracture: Secondary | ICD-10-CM

## 2021-08-16 ENCOUNTER — Ambulatory Visit (INDEPENDENT_AMBULATORY_CARE_PROVIDER_SITE_OTHER): Payer: Medicare Other | Admitting: Psychology

## 2021-08-16 DIAGNOSIS — F319 Bipolar disorder, unspecified: Secondary | ICD-10-CM | POA: Diagnosis not present
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
## 2021-08-22 DIAGNOSIS — G43719 Chronic migraine without aura, intractable, without status migrainosus: Secondary | ICD-10-CM | POA: Diagnosis not present

## 2021-08-22 DIAGNOSIS — M791 Myalgia, unspecified site: Secondary | ICD-10-CM | POA: Diagnosis not present

## 2021-08-22 DIAGNOSIS — G518 Other disorders of facial nerve: Secondary | ICD-10-CM | POA: Diagnosis not present

## 2021-08-22 DIAGNOSIS — M542 Cervicalgia: Secondary | ICD-10-CM | POA: Diagnosis not present

## 2021-08-23 ENCOUNTER — Ambulatory Visit (INDEPENDENT_AMBULATORY_CARE_PROVIDER_SITE_OTHER): Payer: Medicare Other | Admitting: Psychiatry

## 2021-08-23 ENCOUNTER — Encounter: Payer: Self-pay | Admitting: Psychiatry

## 2021-08-23 DIAGNOSIS — F4001 Agoraphobia with panic disorder: Secondary | ICD-10-CM | POA: Diagnosis not present

## 2021-08-23 DIAGNOSIS — F4310 Post-traumatic stress disorder, unspecified: Secondary | ICD-10-CM | POA: Diagnosis not present

## 2021-08-23 DIAGNOSIS — F319 Bipolar disorder, unspecified: Secondary | ICD-10-CM | POA: Diagnosis not present

## 2021-08-23 DIAGNOSIS — F411 Generalized anxiety disorder: Secondary | ICD-10-CM

## 2021-08-23 DIAGNOSIS — G3184 Mild cognitive impairment, so stated: Secondary | ICD-10-CM

## 2021-08-23 DIAGNOSIS — F5105 Insomnia due to other mental disorder: Secondary | ICD-10-CM

## 2021-08-23 DIAGNOSIS — F3132 Bipolar disorder, current episode depressed, moderate: Secondary | ICD-10-CM | POA: Diagnosis not present

## 2021-08-23 MED ORDER — QUETIAPINE FUMARATE 300 MG PO TABS: 300.0000 mg | ORAL_TABLET | Freq: Every day | ORAL | 0 refills | Status: DC

## 2021-08-23 NOTE — Progress Notes (Signed)
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   BASOSABS 63 11/11/2020 1116    Lithium Lvl  Date Value Ref Range Status  08/18/2020 0.17 (L) 0.60 - 1.20 mmol/L Final    Comment:    Performed at [facility], [place] [address] [place], [place] [address]    May 20, 2019 lithium level 0.9 on 450 mg daily and creatinine was 1.1 with normal calcium 07/21/19 lithium 0.4 on 450 mg daily, normal CMP  No results found for: "PHENYTOIN", "PHENOBARB", "VALPROATE", "CBMZ"   .res Assessment: Plan:     Meredith [name] was seen today for follow-up, depression, anxiety and post-traumatic stress disorder.  Diagnoses and all orders for this visit:  Bipolar affective disorder, rapid cycling (HCC)  Generalized anxiety disorder  Panic disorder with agoraphobia  PTSD (post-traumatic stress disorder)  Insomnia due to mental condition  Mild cognitive impairment  History of lithium toxicity Hx repeated bouts of encephalopathy  Greater than 50% of 30 min face to face time with patient was spent on  counseling and coordination of care. We discussed the following.  Dariona is a chronically mentally ill patient with chronic depression and chronic anxiety and multiple med failures.  [facility] hosp for acute encephalopathy 07/2020.  The cause was unknown but presumed to be medication related.  Patient denies ever taking any medications including not ever taking hydrocodone.  She has had 3 episodes over the last couple of years.  1 was possibly related to lithium toxicity but the other 2 do not appear related.  She claims compliance with medication and that she is using a pillbox for everything except the opiate.  Discussed that she could be possibly overtaking the opiate at times and  not realize it.  She denies doing this.  The 3 medications that were stopped at the hospital, Abilify, lamotrigine and lithium in this case were highly unlikely to cause acute encephalopathy.  So the cause for this episode is unknown.   The patient has not been stable psychiatrically on low-dose quetiapine in the past   Current psych meds Seroquel 200 HS are not preventing mood cycling. Increased Abilify back to 30 mg daily did not help) Wean off Latuda DT cost  Seroquel 200 mg HS.This has not been suffiecient to maintan mood  Using pill box to keep up with meds.  Consistent.  Using pillbox to help compliance.  Disc danger of mixing up or doubling up meds.  She fills box herself.  Rec she get help with this. Cannot afford branded meds which prevents [place] from using some of the bipolar depression meds like Vraylar.  Few options for depression and anxiety therefore increase Seroquel to 300 mg HS Caution re: sedative risks emphasiszed and fall risk.  Option Trileptal not as good for depression.  Push fluids.  Has to remind herself..  Disc ways to talk to brother about helping her.   Discussed potential metabolic side effects associated with atypical antipsychotics, as well as potential risk for movement side effects. Advised pt to  contact office if movement side effects occur.   OK Ativan prn for dental procedure 1-2 mg.  No driving after it.  PCP Elyn Peers, [facility]  Continue therapy with Dr. Cheryln Manly q 2 weeks. Consider better medication coverage when gets new insurance Discussed this issue with her again although I think she again shows a cheaper plan which will limit medication options.  Follow-up 6 weeks  Lynder Parents MD, DFAPA  Please see After Visit Summary for patient specific instructions.  Future Appointments  Date Time Provider [place]  08/30/2021  1:00 PM Oren Binet, PhD [facility] None  08/31/2021  1:30 PM [facility]  09/13/2021  1:00 PM Oren Binet, PhD [facility] None  09/27/2021  1:00 PM Oren Binet, PhD [facility] None  10/11/2021  1:00 PM Oren Binet, PhD [facility] None  10/25/2021  1:00 PM Oren Binet, PhD [facility] None  11/08/2021  1:00 PM Oren Binet, PhD [facility] None  11/22/2021  1:00 PM Oren Binet, PhD [facility] None  12/06/2021  1:00 PM Oren Binet, PhD [facility] None  12/20/2021  1:00 PM Oren Binet, PhD [facility] None  01/03/2022  1:00 PM Oren Binet, PhD [facility] None  01/17/2022  1:00 PM Oren Binet, PhD [facility] None  01/31/2022  1:00 PM Oren Binet, PhD [facility] None  02/14/2022  1:00 PM Oren Binet, PhD [facility] None    No orders of the defined types were placed in this encounter.      -------------------------------

## 2021-08-28 ENCOUNTER — Other Ambulatory Visit: Payer: Self-pay | Admitting: Cardiovascular Disease

## 2021-08-28 DIAGNOSIS — I480 Paroxysmal atrial fibrillation: Secondary | ICD-10-CM

## 2021-08-28 DIAGNOSIS — M47816 Spondylosis without myelopathy or radiculopathy, lumbar region: Secondary | ICD-10-CM | POA: Diagnosis not present

## 2021-08-30 ENCOUNTER — Ambulatory Visit (INDEPENDENT_AMBULATORY_CARE_PROVIDER_SITE_OTHER): Payer: Medicare Other | Admitting: Psychology

## 2021-08-30 DIAGNOSIS — F319 Bipolar disorder, unspecified: Secondary | ICD-10-CM | POA: Diagnosis not present

## 2021-08-30 NOTE — Progress Notes (Signed)
Meredith [name] is a 71 y.o. female patient   Date: 08/30/2021  Treatment Plan: Diagnosis F31.81 (Bipolar II disorder) [n/a]  Symptoms Depressed or irritable mood. (Status: maintained) -- No Description Entered  Lack of energy. (Status: maintained) -- No Description Entered  Social withdrawal. (Status: maintained) -- No Description Entered  Medication Status compliance  Safety none  If Suicidal or Homicidal State Action Taken: unspecified  Current Risk: low Medications Abilify (Dosage: '30mg'$ )  Hydocodone (Dosage: 7.'5mg'$ )  Lamotrigine (Dosage: '200mg'$ /day)  Lithium (Dosage: '450mg'$ /day)  Seroquel (Dosage: '200mg'$ /day)  Objectives Related Problem: Develop healthy cognitive patterns and beliefs about self and the world that lead to alleviation and help prevent the relapse of mood episodes. Description: Verbalize grief, fear, and anger regarding real or imagined losses in life. Target Date: 2022-02-08 Frequency: Daily Modality: individual Progress: 70%  Related Problem: Develop healthy cognitive patterns and beliefs about self and the world that lead to alleviation and help prevent the relapse of mood episodes. Description: Take prescribed medications as directed. Target Date: 2022-02-08 Frequency: Daily Modality: individual Progress: 90%  Related Problem: Develop healthy cognitive patterns and beliefs about self and the world that lead to alleviation and help prevent the relapse of mood episodes. Description: Describe mood state, energy level, amount of control over thoughts, and sleeping pattern. Target Date: 2022-02-08 Frequency: Daily Modality: individual Progress: 70%  Client Response full compliance  Service Location Location, [name] [place], [place] [address]  Service Code cpt [phone] P Neuropsych.  testing  Related past to present  Self-monitoring  Self care activities  Emotion regulation skills  Validate/empathize  Facilitate problem solving  Normalize/Reframe  Journaling  Comments  Dx.: F31.81  Goals: States she is seeking emotional stability. She needs guidance in managing some of her physical/medical challenges and limitations. Also, have some interpersonal challenges and seeks feedback on addressing these difficulties. Target date is 12-23.  Meds: Seroquel ('300mg'$  daily), Hydrocodone (7.5 mg up to 3x/day), Latuda '60mg'$  Patient requests a virtual session. She is at home and provider is in his home office.  Meredith [name] says she has had migraines the past two days. She can only take aspirin and has little impact. She has injections from neurologist last week to help with headaches, both of which were "a two out of three". She never has a day without a headache.  Saw Dr. Clovis Pu last week and told him she never got called about samples for her meds. He told her to stop taking the [place]. He increased the Seroquel. She says she is feeling better and hope that will continue to get better. Her son has a 51st birthday tomorrow and she plans to call. She is hoping to save enough money to fly to [place] to see her son at some point next year. Told her to keep a mood journal to monitor fluctuations.  Meredith Morel, PhD Time: 1:10p-2:00 50 minutes

## 2021-08-31 ENCOUNTER — Ambulatory Visit (INDEPENDENT_AMBULATORY_CARE_PROVIDER_SITE_OTHER): Payer: Medicare Other | Admitting: *Deleted

## 2021-08-31 DIAGNOSIS — Z5181 Encounter for therapeutic drug level monitoring: Secondary | ICD-10-CM

## 2021-08-31 DIAGNOSIS — I480 Paroxysmal atrial fibrillation: Secondary | ICD-10-CM

## 2021-08-31 LAB — POCT INR: INR: 2.9 (ref 2.0–3.0)

## 2021-08-31 NOTE — Patient Instructions (Signed)
Description   Continue taking Warfarin 1.5 tablets everyday except 2 tablets on Mondays, Wednesdays, and Fridays. Recheck INR in 6 weeks. Call with any medication changes or if you are scheduled for surgery [facility] [phone] Main [phone]

## 2021-09-05 DIAGNOSIS — M533 Sacrococcygeal disorders, not elsewhere classified: Secondary | ICD-10-CM | POA: Diagnosis not present

## 2021-09-05 DIAGNOSIS — Z79891 Long term (current) use of opiate analgesic: Secondary | ICD-10-CM | POA: Diagnosis not present

## 2021-09-05 DIAGNOSIS — M791 Myalgia, unspecified site: Secondary | ICD-10-CM | POA: Diagnosis not present

## 2021-09-05 DIAGNOSIS — M47816 Spondylosis without myelopathy or radiculopathy, lumbar region: Secondary | ICD-10-CM | POA: Diagnosis not present

## 2021-09-05 DIAGNOSIS — G894 Chronic pain syndrome: Secondary | ICD-10-CM | POA: Diagnosis not present

## 2021-09-13 ENCOUNTER — Ambulatory Visit (INDEPENDENT_AMBULATORY_CARE_PROVIDER_SITE_OTHER): Payer: Medicare Other | Admitting: Psychology

## 2021-09-13 DIAGNOSIS — F319 Bipolar disorder, unspecified: Secondary | ICD-10-CM | POA: Diagnosis not present

## 2021-09-13 NOTE — Progress Notes (Signed)
Meredith [name] is a 71 y.o. female patient   Date: 09/13/2021  Treatment Plan: Diagnosis F31.81 (Bipolar II disorder) [n/a]  Symptoms Depressed or irritable mood. (Status: maintained) -- No Description Entered  Lack of energy. (Status: maintained) -- No Description Entered  Social withdrawal. (Status: maintained) -- No Description Entered  Medication Status compliance  Safety none  If Suicidal or Homicidal State Action Taken: unspecified  Current Risk: low Medications Abilify (Dosage: '30mg'$ )  Hydocodone (Dosage: 7.'5mg'$ )  Lamotrigine (Dosage: '200mg'$ /day)  Lithium (Dosage: '450mg'$ /day)  Seroquel (Dosage: '200mg'$ /day)  Objectives Related Problem: Develop healthy cognitive patterns and beliefs about self and the world that lead to alleviation and help prevent the relapse of mood episodes. Description: Verbalize grief, fear, and anger regarding real or imagined losses in life. Target Date: 2022-02-08 Frequency: Daily Modality: individual Progress: 70%  Related Problem: Develop healthy cognitive patterns and beliefs about self and the world that lead to alleviation and help prevent the relapse of mood episodes. Description: Take prescribed medications as directed. Target Date: 2022-02-08 Frequency: Daily Modality: individual Progress: 90%  Related Problem: Develop healthy cognitive patterns and beliefs about self and the world that lead to alleviation and help prevent the relapse of mood episodes. Description: Describe mood state, energy level, amount of control over thoughts, and sleeping pattern. Target Date: 2022-02-08 Frequency: Daily Modality: individual Progress: 70%  Client Response full compliance  Service Location Location, [name] [place], [place] [address]  Service  Code cpt [phone] P Neuropsych. testing  Related past to present  Self-monitoring  Self care activities  Emotion regulation skills  Validate/empathize  Facilitate problem solving  Normalize/Reframe  Journaling  Comments  Dx.: F31.81  Goals: States she is seeking emotional stability. She needs guidance in managing some of her physical/medical challenges and limitations. Also, have some interpersonal challenges and seeks feedback on addressing these difficulties. Target date is 12-23.  Meds: Seroquel ('300mg'$  daily), Hydrocodone (7.5 mg up to 3x/day), Latuda '60mg'$  Patient requests a face to face session. [name] says she feels her depression is much better but her anxiety is worse. This anxiety is related to her financial situation which has been bad since last August. She states that when she gets manic, she will overspend, which is how she got into this bind. Kept the mood journal we discussed. She recorded varying levels of anxiety, which often was accompanied by migraines. She is finding that the finances are frequently on her mind and fears that she will not be able to pay for groceries.We discussed various medications for anxiety and she has been on everything at one tim or another. Will see Dr. Clovis Pu on the morning of the 26th and will discuss. That afternoon, she will have the radio frequency procedure done to burn the nerve endings to relieve the pain. She is currently on 3 pain meds daily and hopes this will reduce the need for medication. She had this done in past but has  no memory if it worked for her. She has been missing bible study because the timing is bad for her to get dinner completed and out of house on time. Her sleep has also been disrupted and she will take an occasional Trazodone. Will explore medication options with Dr. Clovis Pu. Will also utilize relation strategies to manage anxiety.                                                  Meredith Morel, PhD Time: 1:10p-2:00 50  minutes

## 2021-09-20 ENCOUNTER — Encounter: Payer: Self-pay | Admitting: Psychiatry

## 2021-09-20 ENCOUNTER — Ambulatory Visit (INDEPENDENT_AMBULATORY_CARE_PROVIDER_SITE_OTHER): Payer: Medicare Other | Admitting: Psychiatry

## 2021-09-20 DIAGNOSIS — F5105 Insomnia due to other mental disorder: Secondary | ICD-10-CM | POA: Diagnosis not present

## 2021-09-20 DIAGNOSIS — Z8669 Personal history of other diseases of the nervous system and sense organs: Secondary | ICD-10-CM | POA: Diagnosis not present

## 2021-09-20 DIAGNOSIS — F4310 Post-traumatic stress disorder, unspecified: Secondary | ICD-10-CM | POA: Diagnosis not present

## 2021-09-20 DIAGNOSIS — M47816 Spondylosis without myelopathy or radiculopathy, lumbar region: Secondary | ICD-10-CM | POA: Diagnosis not present

## 2021-09-20 DIAGNOSIS — F4001 Agoraphobia with panic disorder: Secondary | ICD-10-CM | POA: Diagnosis not present

## 2021-09-20 DIAGNOSIS — F411 Generalized anxiety disorder: Secondary | ICD-10-CM

## 2021-09-20 DIAGNOSIS — G3184 Mild cognitive impairment, so stated: Secondary | ICD-10-CM | POA: Diagnosis not present

## 2021-09-20 DIAGNOSIS — F319 Bipolar disorder, unspecified: Secondary | ICD-10-CM

## 2021-09-20 MED ORDER — TRAZODONE HCL 50 MG PO TABS: 50.0000 mg | ORAL_TABLET | Freq: Every evening | ORAL | 1 refills | Status: DC | PRN

## 2021-09-20 MED ORDER — BUSPIRONE HCL 15 MG PO TABS: ORAL_TABLET | ORAL | 1 refills | Status: DC

## 2021-09-20 NOTE — Progress Notes (Signed)
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   CREATININE 0.99 12/12/2020 1548   CREATININE 1.02 (H) 11/11/2020 1116   CALCIUM 9.5 12/12/2020 1548   PROT 7.1 08/16/2020 1451   ALBUMIN 4.5 08/16/2020 1451   AST 14 (L) 08/16/2020 1451   ALT 10 08/16/2020 1451   ALKPHOS 56 08/16/2020 1451   BILITOT 0.8 08/16/2020 1451   GFRNONAA >60 12/12/2020 1548   GFRNONAA 61 09/02/2020 1552   GFRAA 71 09/02/2020 1552       Component Value Date/Time   WBC 5.5 03/13/2021 1414   WBC 5.7 11/11/2020 1116   RBC 4.60 03/13/2021 1414   RBC 4.45 11/11/2020 1116   HGB 13.9 03/13/2021 1414   HCT 40.0 03/13/2021 1414   PLT 193 03/13/2021 1414   MCV 87 03/13/2021 1414   MCH 30.2 03/13/2021 1414   MCH 29.7 11/11/2020 1116   MCHC 34.8 03/13/2021 1414   MCHC 32.8 11/11/2020 1116   RDW 15.0 03/13/2021 1414   LYMPHSABS 1,448 11/11/2020 1116   MONOABS 0.9 07/15/2019 1150   EOSABS 479 11/11/2020 1116   BASOSABS 63 11/11/2020 1116    Lithium Lvl  Date Value Ref Range Status  08/18/2020 0.17 (L) 0.60 - 1.20 mmol/L Final    Comment:    Performed at [name], [place] [address] [place], [place] [address]    May 20, 2019 lithium level 0.9 on 450 mg daily and creatinine was 1.1 with normal calcium 07/21/19 lithium 0.4 on 450 mg daily, normal CMP  No results found for: "PHENYTOIN", "PHENOBARB", "VALPROATE", "CBMZ"   .res Assessment: Plan:     Meredith [name] was seen today for follow-up, post-traumatic stress disorder, anxiety, depression and stress.  Diagnoses and all orders for this visit:  Bipolar affective disorder, rapid cycling (HCC)  Generalized anxiety disorder -     busPIRone (BUSPAR) 15 MG tablet; Take 1/3 tablet  p.o. twice daily for 1 week, then take 2/3 tablet p.o. twice daily for 1 week, then take 1 tablet p.o. twice daily  Panic disorder with agoraphobia -     busPIRone (BUSPAR) 15 MG tablet; Take 1/3 tablet p.o. twice daily for 1 week, then take 2/3 tablet p.o. twice daily for 1 week, then take 1 tablet p.o. twice daily  PTSD (post-traumatic stress disorder) -     busPIRone (BUSPAR) 15 MG tablet; Take 1/3 tablet p.o. twice daily for 1 week, then take 2/3 tablet p.o. twice daily for 1 week, then take 1 tablet p.o. twice daily  Insomnia due to mental condition -     traZODone (DESYREL) 50 MG tablet; Take 1 tablet (50 mg total) by mouth at bedtime as needed for sleep.  Mild cognitive impairment  History of encephalopathy  History of lithium toxicity Hx repeated bouts of encephalopathy  Greater than 50% of 30 min face to face time with patient was spent on counseling and coordination of care. We discussed the following.  Hiyab is a chronically mentally ill patient with chronic depression and chronic anxiety and multiple med failures.  [facility] for acute encephalopathy 07/2020.  The cause was unknown but presumed to be medication related.  Patient denies ever taking any medications including not ever taking hydrocodone.  She has had 3 episodes over the last couple of years.  1 was possibly related to lithium toxicity but the other 2 do not appear related.   She claims compliance with medication and that she is using a pillbox for everything except the opiate.  Discussed that she could be possibly overtaking the opiate at times and not realize it.  She denies doing this.  The 3 medications that were stopped at the [name], Abilify, lamotrigine and lithium in this case were highly unlikely to cause acute encephalopathy.  So the cause for this episode is unknown.   The patient has not been stable psychiatrically on low-dose quetiapine in the past   Current psych meds Seroquel 300 HS are preventing mood cycling and helping depression Increased Abilify back to 30 mg daily did not help) Restart buspirone and increase to 15 BID  Using pillbox to help compliance.  Disc danger of mixing up or doubling up meds.  She fills box herself.  Rec she get help with this. Cannot afford branded meds which prevents [place] from using some of the bipolar depression meds like Vraylar.  Few options for depression and anxiety therefore increase Seroquel to 300 mg HS Caution re: sedative risks emphasiszed and fall risk.  Option Trileptal not as good for depression.  Push fluids.  Has to remind herself..  Disc ways to talk to brother about helping her again for need of money   Discussed potential metabolic side effects associated with atypical antipsychotics, as well as potential risk for movement side effects. Advised pt to contact office if movement side effects occur.   OK Ativan prn for dental procedure 1-2 mg.  No driving after it.  PCP Elyn Peers, [facility] Bal  Continue therapy with Dr. Cheryln Manly q 2 weeks. Consider better medication coverage when gets new insurance Discussed this issue with her again although I think she again shows a cheaper plan which will limit medication options.  Follow-up 6 weeks  Lynder Parents MD, DFAPA  Please see After Visit Summary for patient specific instructions.  Future Appointments  Date Time Provider [place]   09/27/2021  1:00 PM Oren Binet, PhD [facility]  None  10/11/2021  1:00 PM Oren Binet, PhD [facility] None  10/12/2021  1:30 PM [facility]  10/25/2021  1:00 PM Oren Binet, PhD [facility] None  11/08/2021  1:00 PM Oren Binet, PhD [facility] None  11/22/2021  1:00 PM Oren Binet, PhD [facility] None  12/06/2021  1:00 PM Oren Binet, PhD [facility] None  12/20/2021  1:00 PM Oren Binet, PhD [facility] None  01/03/2022  1:00 PM Oren Binet, PhD [facility] None  01/17/2022  1:00 PM Oren Binet, PhD [facility] None  01/31/2022  1:00 PM Oren Binet, PhD [facility] None  02/14/2022  1:00 PM Oren Binet, PhD [facility] None    No orders of the defined types were placed in this encounter.      -------------------------------

## 2021-09-25 DIAGNOSIS — G894 Chronic pain syndrome: Secondary | ICD-10-CM | POA: Diagnosis not present

## 2021-09-25 DIAGNOSIS — M791 Myalgia, unspecified site: Secondary | ICD-10-CM | POA: Diagnosis not present

## 2021-09-25 DIAGNOSIS — M533 Sacrococcygeal disorders, not elsewhere classified: Secondary | ICD-10-CM | POA: Diagnosis not present

## 2021-09-25 DIAGNOSIS — Z79891 Long term (current) use of opiate analgesic: Secondary | ICD-10-CM | POA: Diagnosis not present

## 2021-09-25 DIAGNOSIS — M47816 Spondylosis without myelopathy or radiculopathy, lumbar region: Secondary | ICD-10-CM | POA: Diagnosis not present

## 2021-09-26 DIAGNOSIS — M791 Myalgia, unspecified site: Secondary | ICD-10-CM | POA: Diagnosis not present

## 2021-09-26 DIAGNOSIS — M542 Cervicalgia: Secondary | ICD-10-CM | POA: Diagnosis not present

## 2021-09-26 DIAGNOSIS — G43719 Chronic migraine without aura, intractable, without status migrainosus: Secondary | ICD-10-CM | POA: Diagnosis not present

## 2021-09-26 DIAGNOSIS — G518 Other disorders of facial nerve: Secondary | ICD-10-CM | POA: Diagnosis not present

## 2021-09-27 ENCOUNTER — Ambulatory Visit (INDEPENDENT_AMBULATORY_CARE_PROVIDER_SITE_OTHER): Payer: Medicare Other | Admitting: Psychology

## 2021-09-27 DIAGNOSIS — F319 Bipolar disorder, unspecified: Secondary | ICD-10-CM | POA: Diagnosis not present

## 2021-09-27 NOTE — Progress Notes (Signed)
Meredith [name] is a 71 y.o. female patient   Date: 09/27/2021  Treatment Plan: Diagnosis F31.81 (Bipolar II disorder) [n/a]  Symptoms Depressed or irritable mood. (Status: maintained) -- No Description Entered  Lack of energy. (Status: maintained) -- No Description Entered  Social withdrawal. (Status: maintained) -- No Description Entered  Medication Status compliance  Safety none  If Suicidal or Homicidal State Action Taken: unspecified  Current Risk: low Medications Abilify (Dosage: '30mg'$ )  Hydocodone (Dosage: 7.'5mg'$ )  Lamotrigine (Dosage: '200mg'$ /day)  Lithium (Dosage: '450mg'$ /day)  Seroquel (Dosage: '200mg'$ /day)  Objectives Related Problem: Develop healthy cognitive patterns and beliefs about self and the world that lead to alleviation and help prevent the relapse of mood episodes. Description: Verbalize grief, fear, and anger regarding real or imagined losses in life. Target Date: 2022-02-08 Frequency: Daily Modality: individual Progress: 70%  Related Problem: Develop healthy cognitive patterns and beliefs about self and the world that lead to alleviation and help prevent the relapse of mood episodes. Description: Take prescribed medications as directed. Target Date: 2022-02-08 Frequency: Daily Modality: individual Progress: 90%  Related Problem: Develop healthy cognitive patterns and beliefs about self and the world that lead to alleviation and help prevent the relapse of mood episodes. Description: Describe mood state, energy level, amount of control over thoughts, and sleeping pattern. Target Date: 2022-02-08 Frequency: Daily Modality: individual Progress: 70%  Client Response full compliance  Service Location Location, [name]  [place], [place] [address]  Service Code cpt [phone] P Neuropsych. testing  Related past to present  Self-monitoring  Self care activities  Emotion regulation skills  Validate/empathize  Facilitate problem solving  Normalize/Reframe  Journaling  Comments  Dx.: F31.81  Goals: States she is seeking emotional stability. She needs guidance in managing some of her physical/medical challenges and limitations. Also, have some interpersonal challenges and seeks feedback on addressing these difficulties. Target date is 12-23.  Meds: Seroquel ('300mg'$  daily), Hydrocodone (7.5 mg up to 3x/day), Latuda '60mg'$  Patient requests a face to face session. Meredith [name] says she has been taking the Buspar, but doesn't start full pill until Sunday. She talked to Meredith [name] last night and says he did not give her a hard time. She asked for $100 to help get her through. She applied and was accepted for a credit card which will help. She says her highs and lows (emotionally) are stable. Only experiencing anxiety. She is concerned that her weight is the highest it has ever been. Says she forgot to journal her feelings. She has decided to stop bible study. Was not really happy with the large group or format.  Meredith Morel, PhD Time: 1:10p-2:00 50 minutes

## 2021-10-02 ENCOUNTER — Other Ambulatory Visit: Payer: Self-pay | Admitting: Family Medicine

## 2021-10-02 DIAGNOSIS — Z1231 Encounter for screening mammogram for malignant neoplasm of breast: Secondary | ICD-10-CM

## 2021-10-04 DIAGNOSIS — M533 Sacrococcygeal disorders, not elsewhere classified: Secondary | ICD-10-CM | POA: Diagnosis not present

## 2021-10-04 DIAGNOSIS — Z79891 Long term (current) use of opiate analgesic: Secondary | ICD-10-CM | POA: Diagnosis not present

## 2021-10-11 ENCOUNTER — Ambulatory Visit (INDEPENDENT_AMBULATORY_CARE_PROVIDER_SITE_OTHER): Payer: Medicare Other | Admitting: Psychology

## 2021-10-11 DIAGNOSIS — F319 Bipolar disorder, unspecified: Secondary | ICD-10-CM

## 2021-10-11 NOTE — Progress Notes (Signed)
Meredith [name] is a 71 y.o. female patient   Date: 10/11/2021  Treatment Plan: Diagnosis F31.81 (Bipolar II disorder) [n/a]  Symptoms Depressed or irritable mood. (Status: maintained) -- No Description Entered  Lack of energy. (Status: maintained) -- No Description Entered  Social withdrawal. (Status: maintained) -- No Description Entered  Medication Status compliance  Safety none  If Suicidal or Homicidal State Action Taken: unspecified  Current Risk: low Medications Abilify (Dosage: '30mg'$ )  Hydocodone (Dosage: 7.'5mg'$ )  Lamotrigine (Dosage: '200mg'$ /day)  Lithium (Dosage: '450mg'$ /day)  Seroquel (Dosage: '200mg'$ /day)  Objectives Related Problem: Develop healthy cognitive patterns and beliefs about self and the world that lead to alleviation and help prevent the relapse of mood episodes. Description: Verbalize grief, fear, and anger regarding real or imagined losses in life. Target Date: 2022-02-08 Frequency: Daily Modality: individual Progress: 70%  Related Problem: Develop healthy cognitive patterns and beliefs about self and the world that lead to alleviation and help prevent the relapse of mood episodes. Description: Take prescribed medications as directed. Target Date: 2022-02-08 Frequency: Daily Modality: individual Progress: 90%  Related Problem: Develop healthy cognitive patterns and beliefs about self and the world that lead to alleviation and help prevent the relapse of mood episodes. Description: Describe mood state, energy level, amount of control over thoughts, and sleeping pattern. Target Date: 2022-02-08 Frequency: Daily Modality: individual Progress: 70%  Client Response full compliance  Service  Location Location, [name] [place], [place] [address]  Service Code cpt [phone] P Neuropsych. testing  Related past to present  Self-monitoring  Self care activities  Emotion regulation skills  Validate/empathize  Facilitate problem solving  Normalize/Reframe  Journaling  Comments  Dx.: F31.81  Goals: States she is seeking emotional stability. She needs guidance in managing some of her physical/medical challenges and limitations. Also, have some interpersonal challenges and seeks feedback on addressing these difficulties. Target date is 12-23.  Meds: Seroquel ('300mg'$  daily), Hydrocodone (7.5 mg up to 3x/day), Buspar ('10mg'$  twice daily).  Patient requests a face to face session. [name] says that she is doing well and enjoying summer weather. Says she missed church due to a migraine that was a level 2-3. She has headaches daily, but they are not usually at this level. Has had more 2-3 levels the past month. Not sure why  other than stress. She does feel, however, the increase dose of Buspar has helped in that her anxiety is less intense. She is scheduled to get another SI joint injection due to chronic pain. She claims that her depression is also slightly better.  [name], PhD Time: 1:10p-2:00 50 minutes

## 2021-10-12 ENCOUNTER — Ambulatory Visit (INDEPENDENT_AMBULATORY_CARE_PROVIDER_SITE_OTHER): Payer: Medicare Other

## 2021-10-12 DIAGNOSIS — Z5181 Encounter for therapeutic drug level monitoring: Secondary | ICD-10-CM

## 2021-10-12 DIAGNOSIS — I480 Paroxysmal atrial fibrillation: Secondary | ICD-10-CM | POA: Diagnosis not present

## 2021-10-12 LAB — POCT INR: INR: 2.4 (ref 2.0–3.0)

## 2021-10-12 NOTE — Patient Instructions (Signed)
Continue taking Warfarin 1.5 tablets everyday except 2 tablets on Mondays, Wednesdays, and Fridays. Recheck INR in 6 weeks. Call with any medication changes or if you are scheduled for surgery [facility] [phone] Main [phone]

## 2021-10-13 DIAGNOSIS — R7303 Prediabetes: Secondary | ICD-10-CM | POA: Diagnosis not present

## 2021-10-13 DIAGNOSIS — N183 Chronic kidney disease, stage 3 unspecified: Secondary | ICD-10-CM | POA: Diagnosis not present

## 2021-10-13 DIAGNOSIS — E039 Hypothyroidism, unspecified: Secondary | ICD-10-CM | POA: Diagnosis not present

## 2021-10-13 DIAGNOSIS — E7800 Pure hypercholesterolemia, unspecified: Secondary | ICD-10-CM | POA: Diagnosis not present

## 2021-10-13 DIAGNOSIS — Z7901 Long term (current) use of anticoagulants: Secondary | ICD-10-CM | POA: Diagnosis not present

## 2021-10-13 DIAGNOSIS — D649 Anemia, unspecified: Secondary | ICD-10-CM | POA: Diagnosis not present

## 2021-10-13 DIAGNOSIS — M810 Age-related osteoporosis without current pathological fracture: Secondary | ICD-10-CM | POA: Diagnosis not present

## 2021-10-18 DIAGNOSIS — Z7901 Long term (current) use of anticoagulants: Secondary | ICD-10-CM | POA: Diagnosis not present

## 2021-10-18 DIAGNOSIS — G43009 Migraine without aura, not intractable, without status migrainosus: Secondary | ICD-10-CM | POA: Diagnosis not present

## 2021-10-18 DIAGNOSIS — M797 Fibromyalgia: Secondary | ICD-10-CM | POA: Diagnosis not present

## 2021-10-18 DIAGNOSIS — F3181 Bipolar II disorder: Secondary | ICD-10-CM | POA: Diagnosis not present

## 2021-10-18 DIAGNOSIS — D6869 Other thrombophilia: Secondary | ICD-10-CM | POA: Diagnosis not present

## 2021-10-18 DIAGNOSIS — K589 Irritable bowel syndrome without diarrhea: Secondary | ICD-10-CM | POA: Diagnosis not present

## 2021-10-18 DIAGNOSIS — E039 Hypothyroidism, unspecified: Secondary | ICD-10-CM | POA: Diagnosis not present

## 2021-10-18 DIAGNOSIS — I700 Atherosclerosis of aorta: Secondary | ICD-10-CM | POA: Diagnosis not present

## 2021-10-18 DIAGNOSIS — E7800 Pure hypercholesterolemia, unspecified: Secondary | ICD-10-CM | POA: Diagnosis not present

## 2021-10-18 DIAGNOSIS — E559 Vitamin D deficiency, unspecified: Secondary | ICD-10-CM | POA: Diagnosis not present

## 2021-10-18 DIAGNOSIS — D649 Anemia, unspecified: Secondary | ICD-10-CM | POA: Diagnosis not present

## 2021-10-18 DIAGNOSIS — Z6835 Body mass index (BMI) 35.0-35.9, adult: Secondary | ICD-10-CM | POA: Diagnosis not present

## 2021-10-19 DIAGNOSIS — M533 Sacrococcygeal disorders, not elsewhere classified: Secondary | ICD-10-CM | POA: Diagnosis not present

## 2021-10-23 DIAGNOSIS — G894 Chronic pain syndrome: Secondary | ICD-10-CM | POA: Diagnosis not present

## 2021-10-23 DIAGNOSIS — Z79891 Long term (current) use of opiate analgesic: Secondary | ICD-10-CM | POA: Diagnosis not present

## 2021-10-23 DIAGNOSIS — M47816 Spondylosis without myelopathy or radiculopathy, lumbar region: Secondary | ICD-10-CM | POA: Diagnosis not present

## 2021-10-23 DIAGNOSIS — M533 Sacrococcygeal disorders, not elsewhere classified: Secondary | ICD-10-CM | POA: Diagnosis not present

## 2021-10-24 DIAGNOSIS — G43719 Chronic migraine without aura, intractable, without status migrainosus: Secondary | ICD-10-CM | POA: Diagnosis not present

## 2021-10-24 DIAGNOSIS — M542 Cervicalgia: Secondary | ICD-10-CM | POA: Diagnosis not present

## 2021-10-24 DIAGNOSIS — G518 Other disorders of facial nerve: Secondary | ICD-10-CM | POA: Diagnosis not present

## 2021-10-24 DIAGNOSIS — M791 Myalgia, unspecified site: Secondary | ICD-10-CM | POA: Diagnosis not present

## 2021-10-25 ENCOUNTER — Ambulatory Visit (INDEPENDENT_AMBULATORY_CARE_PROVIDER_SITE_OTHER): Payer: Medicare Other | Admitting: Psychology

## 2021-10-25 DIAGNOSIS — F319 Bipolar disorder, unspecified: Secondary | ICD-10-CM | POA: Diagnosis not present

## 2021-10-25 NOTE — Progress Notes (Signed)
Meredith [name] is a 71 y.o. female patient   Date: 10/25/2021  Treatment Plan: Diagnosis F31.81 (Bipolar II disorder) [n/a]  Symptoms Depressed or irritable mood. (Status: maintained) -- No Description Entered  Lack of energy. (Status: maintained) -- No Description Entered  Social withdrawal. (Status: maintained) -- No Description Entered  Medication Status compliance  Safety none  If Suicidal or Homicidal State Action Taken: unspecified  Current Risk: low Medications Abilify (Dosage: '30mg'$ )  Hydocodone (Dosage: 7.'5mg'$ )  Lamotrigine (Dosage: '200mg'$ /day)  Lithium (Dosage: '450mg'$ /day)  Seroquel (Dosage: '200mg'$ /day)  Objectives Related Problem: Develop healthy cognitive patterns and beliefs about self and the world that lead to alleviation and help prevent the relapse of mood episodes. Description: Verbalize grief, fear, and anger regarding real or imagined losses in life. Target Date: 2022-02-08 Frequency: Daily Modality: individual Progress: 70%  Related Problem: Develop healthy cognitive patterns and beliefs about self and the world that lead to alleviation and help prevent the relapse of mood episodes. Description: Take prescribed medications as directed. Target Date: 2022-02-08 Frequency: Daily Modality: individual Progress: 90%  Related Problem: Develop healthy cognitive patterns and beliefs about self and the world that lead to alleviation and help prevent the relapse of mood episodes. Description: Describe mood state, energy level, amount of control over thoughts, and sleeping pattern. Target Date: 2022-02-08 Frequency: Daily Modality: individual Progress: 70%  Client Response full  compliance  Service Location Location, [name] [place], [place] [address]  Service Code cpt [phone] P Neuropsych. testing  Related past to present  Self-monitoring  Self care activities  Emotion regulation skills  Validate/empathize  Facilitate problem solving  Normalize/Reframe  Journaling  Comments  Dx.: F31.81  Goals: States she is seeking emotional stability. She needs guidance in managing some of her physical/medical challenges and limitations. Also, have some interpersonal challenges and seeks feedback on addressing these difficulties. Target date is 12-23.  Meds: Seroquel ('300mg'$  daily), Hydrocodone (7.5 mg up to 3x/day), Buspar ('10mg'$  twice daily).  Patient requests a face to face session. [name] says she has "lots of doctor's appointments" this week (pain, neurology, me). She had injection at [facility] and it has helped manage the pain. She is feeling reasonably stable with regard to her depression and anxiety. Says the Trazodone, which she takes every night, has helped her sleep through the night. She reports that she is "doing a lot better" than before with regard to her mental health symptoms.  [name], PhD Time: 1:15p-2:00 45 minutes

## 2021-11-03 ENCOUNTER — Ambulatory Visit
Admission: RE | Admit: 2021-11-03 | Discharge: 2021-11-03 | Disposition: A | Discharge status: Home / Self Care | Payer: Medicare Other | Source: Ambulatory Visit | Attending: Family Medicine | Admitting: Family Medicine

## 2021-11-03 DIAGNOSIS — Z1231 Encounter for screening mammogram for malignant neoplasm of breast: Secondary | ICD-10-CM | POA: Diagnosis not present

## 2021-11-08 ENCOUNTER — Ambulatory Visit (INDEPENDENT_AMBULATORY_CARE_PROVIDER_SITE_OTHER): Payer: Medicare Other | Admitting: Psychology

## 2021-11-08 DIAGNOSIS — F319 Bipolar disorder, unspecified: Secondary | ICD-10-CM

## 2021-11-08 NOTE — Progress Notes (Signed)
Jennika Ailanie Ruttan is a 71 y.o. female patient   Date: 11/08/2021  Treatment Plan: Diagnosis F31.81 (Bipolar II disorder) [n/a]  Symptoms Depressed or irritable mood. (Status: maintained) -- No Description Entered  Lack of energy. (Status: maintained) -- No Description Entered  Social withdrawal. (Status: maintained) -- No Description Entered  Medication Status compliance  Safety none  If Suicidal or Homicidal State Action Taken: unspecified  Current Risk: low Medications Abilify (Dosage: '30mg'$ )  Hydocodone (Dosage: 7.'5mg'$ )  Lamotrigine (Dosage: '200mg'$ /day)  Lithium (Dosage: '450mg'$ /day)  Seroquel (Dosage: '200mg'$ /day)  Objectives Related Problem: Develop healthy cognitive patterns and beliefs about self and the world that lead to alleviation and help prevent the relapse of mood episodes. Description: Verbalize grief, fear, and anger regarding real or imagined losses in life. Target Date: 2022-02-08 Frequency: Daily Modality: individual Progress: 70%  Related Problem: Develop healthy cognitive patterns and beliefs about self and the world that lead to alleviation and help prevent the relapse of mood episodes. Description: Take prescribed medications as directed. Target Date: 2022-02-08 Frequency: Daily Modality: individual Progress: 90%  Related Problem: Develop healthy cognitive patterns and beliefs about self and the world that lead to alleviation and help prevent the relapse of mood episodes. Description: Describe mood state, energy level, amount of control over thoughts, and sleeping pattern. Target Date: 2022-02-08 Frequency: Daily Modality: individual Progress:  70%  Client Response full compliance  Service Location Location, [name] [place], [place] [address]  Service Code cpt [phone] P Neuropsych. testing  Related past to present  Self-monitoring  Self care activities  Emotion regulation skills  Validate/empathize  Facilitate problem solving  Normalize/Reframe  Journaling  Comments  Dx.: F31.81  Goals: States she is seeking emotional stability. She needs guidance in managing some of her physical/medical challenges and limitations. Also, have some interpersonal challenges and seeks feedback on addressing these difficulties. Target date is 12-23.  Meds: Seroquel ('300mg'$  daily), Hydrocodone (7.5 mg up to 3x/day), Buspar ('10mg'$  twice daily).  Patient requests a face to face session. Kimyah has daily migraines ranging from 0-3. She can manage at a 2 but 3 keeps her in bed. She has had several 3's lately, including Sunday and had to miss church. She does feel that she can accept that this is her life and that she can minimize how her condition impacts her life. Had yearly mammogram and had a clean report.  Found out that her minister is leaving to go to another (larger) church in [place].She states that she is "depressed" about her weight, the largest she has ever been. Discussed weight loss options. She  will explore programs and determine if she can get coverage through Medicare.                                                   [name], PhD Time: 1:15p-2:00 45 minutes

## 2021-11-13 ENCOUNTER — Other Ambulatory Visit: Payer: Self-pay | Admitting: Psychiatry

## 2021-11-13 DIAGNOSIS — F3132 Bipolar disorder, current episode depressed, moderate: Secondary | ICD-10-CM

## 2021-11-15 DIAGNOSIS — Z23 Encounter for immunization: Secondary | ICD-10-CM | POA: Diagnosis not present

## 2021-11-16 ENCOUNTER — Other Ambulatory Visit: Payer: Self-pay | Admitting: Psychiatry

## 2021-11-16 DIAGNOSIS — F411 Generalized anxiety disorder: Secondary | ICD-10-CM

## 2021-11-16 DIAGNOSIS — F4310 Post-traumatic stress disorder, unspecified: Secondary | ICD-10-CM

## 2021-11-16 DIAGNOSIS — F4001 Agoraphobia with panic disorder: Secondary | ICD-10-CM

## 2021-11-20 DIAGNOSIS — G894 Chronic pain syndrome: Secondary | ICD-10-CM | POA: Diagnosis not present

## 2021-11-20 DIAGNOSIS — M47816 Spondylosis without myelopathy or radiculopathy, lumbar region: Secondary | ICD-10-CM | POA: Diagnosis not present

## 2021-11-20 DIAGNOSIS — Z79891 Long term (current) use of opiate analgesic: Secondary | ICD-10-CM | POA: Diagnosis not present

## 2021-11-20 DIAGNOSIS — M545 Low back pain, unspecified: Secondary | ICD-10-CM | POA: Diagnosis not present

## 2021-11-21 ENCOUNTER — Other Ambulatory Visit: Payer: Self-pay | Admitting: Internal Medicine

## 2021-11-21 DIAGNOSIS — M791 Myalgia, unspecified site: Secondary | ICD-10-CM | POA: Diagnosis not present

## 2021-11-21 DIAGNOSIS — M542 Cervicalgia: Secondary | ICD-10-CM | POA: Diagnosis not present

## 2021-11-21 DIAGNOSIS — G43719 Chronic migraine without aura, intractable, without status migrainosus: Secondary | ICD-10-CM | POA: Diagnosis not present

## 2021-11-21 DIAGNOSIS — G518 Other disorders of facial nerve: Secondary | ICD-10-CM | POA: Diagnosis not present

## 2021-11-21 DIAGNOSIS — I480 Paroxysmal atrial fibrillation: Secondary | ICD-10-CM

## 2021-11-22 ENCOUNTER — Ambulatory Visit (INDEPENDENT_AMBULATORY_CARE_PROVIDER_SITE_OTHER): Payer: Medicare Other | Admitting: Psychology

## 2021-11-22 DIAGNOSIS — F319 Bipolar disorder, unspecified: Secondary | ICD-10-CM | POA: Diagnosis not present

## 2021-11-22 NOTE — Progress Notes (Signed)
   Meredith [name] is a 71 y.o. female patient   Date: 11/22/2021  Treatment Plan: Diagnosis F31.81 (Bipolar II disorder) [n/a]  Symptoms Depressed or irritable mood. (Status: maintained) -- No Description Entered  Lack of energy. (Status: maintained) -- No Description Entered  Social withdrawal. (Status: maintained) -- No Description Entered  Medication Status compliance  Safety none  If Suicidal or Homicidal State Action Taken: unspecified  Current Risk: low Medications Abilify (Dosage: '30mg'$ )  Hydocodone (Dosage: 7.'5mg'$ )  Lamotrigine (Dosage: '200mg'$ /day)  Lithium (Dosage: '450mg'$ /day)  Seroquel (Dosage: '200mg'$ /day)  Objectives Related Problem: Develop healthy cognitive patterns and beliefs about self and the world that lead to alleviation and help prevent the relapse of mood episodes. Description: Verbalize grief, fear, and anger regarding real or imagined losses in life. Target Date: 2022-02-08 Frequency: Daily Modality: individual Progress: 70%  Related Problem: Develop healthy cognitive patterns and beliefs about self and the world that lead to alleviation and help prevent the relapse of mood episodes. Description: Take prescribed medications as directed. Target Date: 2022-02-08 Frequency: Daily Modality: individual Progress: 90%  Related Problem: Develop healthy cognitive patterns and beliefs about self and the world that lead to alleviation and help prevent the relapse of mood episodes. Description: Describe mood state, energy level, amount of control over thoughts, and sleeping pattern. Target Date: 2022-02-08 Frequency: Daily Modality: individual Progress: 70%  Client Response full compliance  Service Location Location, [name] [place], [place] [address]  Service Code cpt [id number] P Neuropsych. testing  Related past to present  Self-monitoring  Self care activities  Emotion regulation skills  Validate/empathize  Facilitate problem solving   Normalize/Reframe  Journaling  Comments  Dx.: F31.81  Goals: States she is seeking emotional stability. She needs guidance in managing some of her physical/medical challenges and limitations. Also, have some interpersonal challenges and seeks feedback on addressing these difficulties. Target date is 12-23.  Meds: Seroquel ('300mg'$  daily), Hydrocodone (7.5 mg up to 3x/day), Buspar ('10mg'$  twice daily).  Patient requests a face to face session. Meredith [name] states that she is "feeling okay". She got flu and Covid vaccines last week and did fine. She says her anxiety and depression are stable but is concerned about her weight gain due to her psychotropic medication. She does not think that there is an alternative she can take instead of Seroquel (which is the cause of the weight gain). Will meet with her psychiatrist next week to discuss.  We talked about ongoing self care and her need to create a routine and structure.                                                           [name], PhD Time: 1:15p-2:00 45 minutes

## 2021-11-23 ENCOUNTER — Ambulatory Visit: Payer: Medicare Other | Attending: Cardiovascular Disease

## 2021-11-23 DIAGNOSIS — Z5181 Encounter for therapeutic drug level monitoring: Secondary | ICD-10-CM | POA: Diagnosis not present

## 2021-11-23 DIAGNOSIS — I480 Paroxysmal atrial fibrillation: Secondary | ICD-10-CM | POA: Diagnosis not present

## 2021-11-23 LAB — POCT INR: INR: 1.6 — AB (ref 2.0–3.0)

## 2021-11-23 NOTE — Patient Instructions (Signed)
TAKE ANOTHER 0.5 TABLET TODAY ONLY and then Continue taking Warfarin 1.5 tablets everyday except 2 tablets on Mondays, Wednesdays, and Fridays. Recheck INR in 4 weeks. Call with any medication changes or if you are scheduled for surgery [facility] [phone] Main [phone]

## 2021-12-04 ENCOUNTER — Ambulatory Visit
Admission: RE | Admit: 2021-12-04 | Discharge: 2021-12-04 | Disposition: A | Discharge status: Home / Self Care | Payer: Medicare Other | Source: Ambulatory Visit | Attending: Nurse Practitioner | Admitting: Nurse Practitioner

## 2021-12-04 ENCOUNTER — Other Ambulatory Visit: Payer: Self-pay | Admitting: Nurse Practitioner

## 2021-12-04 DIAGNOSIS — M47816 Spondylosis without myelopathy or radiculopathy, lumbar region: Secondary | ICD-10-CM

## 2021-12-04 DIAGNOSIS — M4316 Spondylolisthesis, lumbar region: Secondary | ICD-10-CM | POA: Diagnosis not present

## 2021-12-06 ENCOUNTER — Ambulatory Visit (INDEPENDENT_AMBULATORY_CARE_PROVIDER_SITE_OTHER): Payer: Medicare Other | Admitting: Psychology

## 2021-12-06 DIAGNOSIS — F319 Bipolar disorder, unspecified: Secondary | ICD-10-CM

## 2021-12-06 NOTE — Progress Notes (Signed)
                  Meredith [name] is a 71 y.o. female patient   Date: 12/06/2021  Treatment Plan: Diagnosis F31.81 (Bipolar II disorder) [n/a]  Symptoms Depressed or irritable mood. (Status: maintained) -- No Description Entered  Lack of energy. (Status: maintained) -- No Description Entered  Social withdrawal. (Status: maintained) -- No Description Entered  Medication Status compliance  Safety none  If Suicidal or Homicidal State Action Taken: unspecified  Current Risk: low Medications Abilify (Dosage: '30mg'$ )  Hydocodone (Dosage: 7.'5mg'$ )  Lamotrigine (Dosage: '200mg'$ /day)  Lithium (Dosage: '450mg'$ /day)  Seroquel (Dosage: '200mg'$ /day)  Objectives Related Problem: Develop healthy cognitive patterns and beliefs about self and the world that lead to alleviation and help prevent the relapse of mood episodes. Description: Verbalize grief, fear, and anger regarding real or imagined losses in life. Target Date: 2022-02-08 Frequency: Daily Modality: individual Progress: 70%  Related Problem: Develop healthy cognitive patterns and beliefs about self and the world that lead to alleviation and help prevent the relapse of mood episodes. Description: Take prescribed medications as directed. Target Date: 2022-02-08 Frequency: Daily Modality: individual Progress: 90%  Related Problem: Develop healthy cognitive patterns and beliefs about self and the world that lead to alleviation and help prevent the relapse of mood episodes. Description: Describe mood state, energy level, amount of control over thoughts, and sleeping pattern. Target Date: 2022-02-08 Frequency: Daily Modality: individual Progress: 70%  Client Response full compliance  Service Location Location, [name] [place], [place] [address]  Service Code cpt [id number] P Neuropsych. testing  Related past to present  Self-monitoring  Self care activities  Emotion regulation skills  Validate/empathize   Facilitate problem solving  Normalize/Reframe  Journaling  Comments  Dx.: F31.81  Goals: States she is seeking emotional stability. She needs guidance in managing some of her physical/medical challenges and limitations. Also, have some interpersonal challenges and seeks feedback on addressing these difficulties. Target date is 12-23.  Meds: Seroquel ('300mg'$  daily), Hydrocodone (7.5 mg up to 3x/day), Buspar ('10mg'$  twice daily).  Patient requests a face to face session. Allisyn got her RSV shot and had no reactions. Frustrated that her brother will not get any vaccines. She is frustrated with her weight gain and has adopted an intermittant fasting schedule. She will only eat between 12 and 5 daily.  Also, is cutting back calories. We talked about strategies to help insure weight loss success. She will see Dr. Clovis Pu next week for the first time in 2 1/2 months. She made an error in her finance calculations and was short $100 short. Had to borrow money from her brother. He was upset with her, which made Meredith [name] feel bad. She is saying that her mood have been stable and she has been a little less depressed. Her pain is about the same and her moods tend to change as it increases.                                                             [name], PhD Time: 10:40a-11:30a 50 minutes

## 2021-12-11 ENCOUNTER — Encounter: Payer: Self-pay | Admitting: Psychiatry

## 2021-12-11 ENCOUNTER — Ambulatory Visit (INDEPENDENT_AMBULATORY_CARE_PROVIDER_SITE_OTHER): Payer: Medicare Other | Admitting: Psychiatry

## 2021-12-11 DIAGNOSIS — Z8669 Personal history of other diseases of the nervous system and sense organs: Secondary | ICD-10-CM | POA: Diagnosis not present

## 2021-12-11 DIAGNOSIS — F411 Generalized anxiety disorder: Secondary | ICD-10-CM

## 2021-12-11 DIAGNOSIS — F5105 Insomnia due to other mental disorder: Secondary | ICD-10-CM | POA: Diagnosis not present

## 2021-12-11 DIAGNOSIS — F319 Bipolar disorder, unspecified: Secondary | ICD-10-CM | POA: Diagnosis not present

## 2021-12-11 DIAGNOSIS — F4001 Agoraphobia with panic disorder: Secondary | ICD-10-CM | POA: Diagnosis not present

## 2021-12-11 DIAGNOSIS — F4310 Post-traumatic stress disorder, unspecified: Secondary | ICD-10-CM

## 2021-12-11 DIAGNOSIS — G3184 Mild cognitive impairment, so stated: Secondary | ICD-10-CM

## 2021-12-11 MED ORDER — BUSPIRONE HCL 15 MG PO TABS: 15.0000 mg | ORAL_TABLET | Freq: Two times a day (BID) | ORAL | 0 refills | Status: DC

## 2021-12-11 MED ORDER — TRAZODONE HCL 50 MG PO TABS: 50.0000 mg | ORAL_TABLET | Freq: Every evening | ORAL | 0 refills | Status: DC | PRN

## 2021-12-11 NOTE — Progress Notes (Signed)
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     Comments: Insight and judgment fair No delusions.  Cognition appears baseline but she feels it's impaired Neat and pleasant Talkative without pressure.      Lab Review:     Component Value Date/Time   NA 140 12/12/2020 1548   NA 141 12/07/2019 1022   K 4.5 12/12/2020 1548   CL 107 12/12/2020 1548   CO2 26 12/12/2020 1548   GLUCOSE 94 12/12/2020 1548   BUN 14 12/12/2020 1548   BUN 15 12/07/2019 1022   CREATININE 0.99 12/12/2020 1548   CREATININE 1.02 (H) 11/11/2020 1116   CALCIUM 9.5 12/12/2020 1548   PROT 7.1 08/16/2020 1451   ALBUMIN 4.5 08/16/2020 1451   AST 14 (L) 08/16/2020 1451   ALT 10 08/16/2020 1451   ALKPHOS 56 08/16/2020 1451   BILITOT 0.8 08/16/2020 1451   GFRNONAA >60 12/12/2020 1548   GFRNONAA 61 09/02/2020 1552   GFRAA 71 09/02/2020 1552       Component Value Date/Time   WBC 5.5 03/13/2021 1414   WBC 5.7 11/11/2020 1116   RBC 4.60 03/13/2021 1414   RBC 4.45 11/11/2020 1116   HGB 13.9  03/13/2021 1414   HCT 40.0 03/13/2021 1414   PLT 193 03/13/2021 1414   MCV 87 03/13/2021 1414   MCH 30.2 03/13/2021 1414   MCH 29.7 11/11/2020 1116   MCHC 34.8 03/13/2021 1414   MCHC 32.8 11/11/2020 1116   RDW 15.0 03/13/2021 1414   LYMPHSABS 1,448 11/11/2020 1116   MONOABS 0.9 07/15/2019 1150   EOSABS 479 11/11/2020 1116   BASOSABS 63 11/11/2020 1116    Lithium Lvl  Date Value Ref Range Status  08/18/2020 0.17 (L) 0.60 - 1.20 mmol/L Final    Comment:    Performed at [facility], [place] [address] [place], [place] [address]    May 20, 2019 lithium level 0.9 on  450 mg daily and creatinine was 1.1 with normal calcium 07/21/19 lithium 0.4 on 450 mg daily, normal CMP  No results found for: "PHENYTOIN", "PHENOBARB", "VALPROATE", "CBMZ"   .res Assessment: Plan:     Nusaybah was seen today for follow-up, anxiety and depression.  Diagnoses and all orders for this visit:  Bipolar affective disorder, rapid cycling (HCC)  Generalized anxiety disorder -     busPIRone (BUSPAR) 15 MG tablet; Take 1 tablet (15 mg total) by mouth 2 (two) times daily.  Panic disorder with agoraphobia -     busPIRone (BUSPAR) 15 MG tablet; Take 1 tablet (15 mg total) by mouth 2 (two) times daily.  PTSD (post-traumatic stress disorder) -     busPIRone (BUSPAR) 15 MG tablet; Take 1 tablet (15 mg total) by mouth 2 (two) times daily.  Mild cognitive impairment  Insomnia due to mental condition -     traZODone (DESYREL) 50 MG tablet; Take 1 tablet (50 mg total) by mouth at bedtime as needed for sleep.  History of encephalopathy  History of lithium toxicity Hx repeated bouts of encephalopathy  Greater than 50% of 30 min face to face time with patient was spent on counseling and coordination of care. We discussed the following.  Money is a chronically mentally ill patient with chronic depression and chronic anxiety and multiple med failures.  Overall is more stable psychiatrically on low-dose  quetiapine than in the past    [facility] for acute encephalopathy 07/2020.  The cause was unknown but presumed to be medication related.   Cognition appears stable.  Current psych meds Seroquel 300 HS are preventing mood cycling and helping depression Continue trazodone 50 mg HS Restarted buspirone 15 BID helped anxiety.  Using pillbox to help compliance.  Disc danger of mixing up or doubling up meds.  She fills box herself.  Rec she get help with this. Cannot afford branded meds which prevents [place] from using some of the bipolar depression meds like Vraylar.  Few options for depression and anxiety therefore increase Seroquel to 300 mg HS Caution re: sedative risks emphasiszed and fall risk.  Option Trileptal not as good for depression.  Push fluids.  Has to remind herself..  Disc ways to talk to brother about helping her again for need of money   Discussed potential metabolic side effects associated with atypical antipsychotics, as well as potential risk for movement side effects. Advised pt to contact office if movement side effects occur.   OK Ativan prn for dental procedure 1-2 mg.  No driving after it.  PCP Elyn Peers, Genelle Bal  Continue therapy with Dr. Cheryln Manly q 2 weeks. Consider better medication coverage when gets new insurance Discussed this issue with her again although I think she again shows a cheaper plan which will limit medication options. Supportive therapy dealing with ill friends.  Follow-up  2 mos  Lynder Parents MD, DFAPA  Please see After Visit Summary for patient specific instructions.  Future Appointments  Date Time Provider [place]  12/20/2021  1:00 PM Oren Binet, PhD [facility] None  12/21/2021  1:15 PM [facility] None  01/03/2022  1:00 PM Oren Binet, PhD [facility] None  01/17/2022  1:00 PM Oren Binet, PhD [facility] None  01/31/2022  1:00 PM Oren Binet, PhD [facility] None   02/14/2022  1:00 PM Oren Binet, PhD [facility] None  03/09/2022  3:40 PM Werner Lean, MD [facility]    No orders  of the defined types were placed in this encounter.      -------------------------------

## 2021-12-13 ENCOUNTER — Other Ambulatory Visit: Payer: Self-pay | Admitting: Psychiatry

## 2021-12-13 DIAGNOSIS — F4001 Agoraphobia with panic disorder: Secondary | ICD-10-CM

## 2021-12-13 DIAGNOSIS — F5105 Insomnia due to other mental disorder: Secondary | ICD-10-CM

## 2021-12-13 DIAGNOSIS — F411 Generalized anxiety disorder: Secondary | ICD-10-CM

## 2021-12-13 DIAGNOSIS — F4310 Post-traumatic stress disorder, unspecified: Secondary | ICD-10-CM

## 2021-12-19 DIAGNOSIS — M542 Cervicalgia: Secondary | ICD-10-CM | POA: Diagnosis not present

## 2021-12-19 DIAGNOSIS — M791 Myalgia, unspecified site: Secondary | ICD-10-CM | POA: Diagnosis not present

## 2021-12-19 DIAGNOSIS — G43719 Chronic migraine without aura, intractable, without status migrainosus: Secondary | ICD-10-CM | POA: Diagnosis not present

## 2021-12-19 DIAGNOSIS — G518 Other disorders of facial nerve: Secondary | ICD-10-CM | POA: Diagnosis not present

## 2021-12-20 ENCOUNTER — Ambulatory Visit (INDEPENDENT_AMBULATORY_CARE_PROVIDER_SITE_OTHER): Payer: Medicare Other | Admitting: Psychology

## 2021-12-20 DIAGNOSIS — F319 Bipolar disorder, unspecified: Secondary | ICD-10-CM | POA: Diagnosis not present

## 2021-12-20 NOTE — Progress Notes (Signed)
Meredith [name] is a 71 y.o. female patient   Date: 12/20/2021  Treatment Plan: Diagnosis F31.81 (Bipolar II disorder) [n/a]  Symptoms Depressed or irritable mood. (Status: maintained) -- No Description Entered  Lack of energy. (Status: maintained) -- No Description Entered  Social withdrawal. (Status: maintained) -- No Description Entered  Medication Status compliance  Safety none  If Suicidal or Homicidal State Action Taken: unspecified  Current Risk: low Medications Abilify (Dosage: 16m)  Hydocodone (Dosage: 7.564m  Lamotrigine (Dosage: 20047may)  Lithium (Dosage: 450m32my)  Seroquel (Dosage: 200mg43m)  Objectives Related Problem: Develop healthy cognitive patterns and beliefs about self and the world that lead to alleviation and help prevent the relapse of mood episodes. Description: Verbalize grief, fear, and anger regarding real or imagined losses in life. Target Date: 2022-02-08 Frequency: Daily Modality: individual Progress: 70%  Related Problem: Develop healthy cognitive patterns and beliefs about self and the world that lead to alleviation and help prevent the relapse of mood episodes. Description: Take prescribed medications as directed. Target Date: 2022-02-08 Frequency: Daily Modality: individual Progress: 90%  Related Problem: Develop healthy cognitive patterns and beliefs about self and the world that lead to alleviation and help prevent the relapse of mood episodes. Description: Describe mood state, energy level, amount of control over thoughts, and sleeping pattern. Target Date: 2022-02-08 Frequency: Daily Modality: individual Progress: 70%  Client Response full compliance  Service Location Location, [name] [id number] Code cpt [id number]. testing  Related past to present  Self-monitoring  Self care activities  Emotion regulation  skills  Validate/empathize  Facilitate problem solving  Normalize/Reframe  Journaling  Comments  Dx.: F31.81  Goals: States she is seeking emotional stability. She needs guidance in managing some of her physical/medical challenges and limitations. Also, have some interpersonal challenges and seeks feedback on addressing these difficulties. Target date is 12-23.  Meds: Seroquel (300mg 40my), Hydrocodone (7.5 mg up to 3x/day), Buspar (10mg t68m daily).  Patient requests a face to face session. Meredith [name] that she is going to a number of different doctor's appointments. She is having back discomfort and had x-rays. Will met tomorrow with provider for feedback. She reports that she is still getting ou socially with friends.She talked about feeling emotionally stable. She saw Dr. Cottle Clovis Pueek and there were no medicine changes. She told him her concern about her weight gain. She is on a new diet and eats only between 12 and 5. She has lost 4 pounds in 2 weeks without exercise.She saw a show on TV that reminded her of the lack of support she got from her husband when her mental illness was revealed. It was a very painful time for her. He has had thoughts of contacting him, but has not taken any steps. Meredith [name] still feels the effects of that emotional abandonment which also had a negative impact on her relationship with her sons.                                                                  Meredith [name]  [name], PhD Time: 1:05p-2:00p 55 minutes

## 2021-12-21 ENCOUNTER — Ambulatory Visit: Payer: Medicare Other | Attending: Cardiovascular Disease | Admitting: *Deleted

## 2021-12-21 DIAGNOSIS — M47816 Spondylosis without myelopathy or radiculopathy, lumbar region: Secondary | ICD-10-CM | POA: Diagnosis not present

## 2021-12-21 DIAGNOSIS — Z5181 Encounter for therapeutic drug level monitoring: Secondary | ICD-10-CM | POA: Diagnosis not present

## 2021-12-21 DIAGNOSIS — G894 Chronic pain syndrome: Secondary | ICD-10-CM | POA: Diagnosis not present

## 2021-12-21 DIAGNOSIS — I480 Paroxysmal atrial fibrillation: Secondary | ICD-10-CM

## 2021-12-21 DIAGNOSIS — Z79891 Long term (current) use of opiate analgesic: Secondary | ICD-10-CM | POA: Diagnosis not present

## 2021-12-21 DIAGNOSIS — M545 Low back pain, unspecified: Secondary | ICD-10-CM | POA: Diagnosis not present

## 2021-12-21 LAB — POCT INR: INR: 1.9 — AB (ref 2.0–3.0)

## 2021-12-21 NOTE — Patient Instructions (Signed)
Description   TAKE ANOTHER 1/2 TABLET TODAY ONLY and then continue taking Warfarin 1.5 tablets everyday except 2 tablets on Mondays, Wednesdays, and Fridays. Recheck INR in 4 weeks. Call with any medication changes or if you are scheduled for surgery [facility] [phone] Main [phone]

## 2022-01-03 ENCOUNTER — Ambulatory Visit (INDEPENDENT_AMBULATORY_CARE_PROVIDER_SITE_OTHER): Payer: Medicare Other | Admitting: Psychology

## 2022-01-03 DIAGNOSIS — F319 Bipolar disorder, unspecified: Secondary | ICD-10-CM | POA: Diagnosis not present

## 2022-01-03 NOTE — Progress Notes (Signed)
                                                Nickcole Breia Ocampo is a 71 y.o. female patient   Date: 01/03/2022  Treatment Plan: Diagnosis F31.81 (Bipolar II disorder) [n/a]  Symptoms Depressed or irritable mood. (Status: maintained) -- No Description Entered  Lack of energy. (Status: maintained) -- No Description Entered  Social withdrawal. (Status: maintained) -- No Description Entered  Medication Status compliance  Safety none  If Suicidal or Homicidal State Action Taken: unspecified  Current Risk: low Medications Abilify (Dosage: '30mg'$ )  Hydocodone (Dosage: 7.'5mg'$ )  Lamotrigine (Dosage: '200mg'$ /day)  Lithium (Dosage: '450mg'$ /day)  Seroquel (Dosage: '200mg'$ /day)  Objectives Related Problem: Develop healthy cognitive patterns and beliefs about self and the world that lead to alleviation and help prevent the relapse of mood episodes. Description: Verbalize grief, fear, and anger regarding real or imagined losses in life. Target Date: 2022-02-08 Frequency: Daily Modality: individual Progress: 70%  Related Problem: Develop healthy cognitive patterns and beliefs about self and the world that lead to alleviation and help prevent the relapse of mood episodes. Description: Take prescribed medications as directed. Target Date: 2022-02-08 Frequency: Daily Modality: individual Progress: 90%  Related Problem: Develop healthy cognitive patterns and beliefs about self and the world that lead to alleviation and help prevent the relapse of mood episodes. Description: Describe mood state, energy level, amount of control over thoughts, and sleeping pattern. Target Date: 2022-02-08 Frequency: Daily Modality: individual Progress: 70%  Client Response full compliance  Service Location Location, [name] [place], [place] [address]  Service Code cpt [phone] P Neuropsych. testing  Related past to present  Self-monitoring  Self care  activities  Emotion regulation skills  Validate/empathize  Facilitate problem solving  Normalize/Reframe  Journaling  Comments  Dx.: F31.81  Goals: States she is seeking emotional stability. She needs guidance in managing some of her physical/medical challenges and limitations. Also, have some interpersonal challenges and seeks feedback on addressing these difficulties. Target date is 12-23.  Meds: Seroquel ('300mg'$  daily), Hydrocodone (7.5 mg up to 3x/day), Buspar ('10mg'$  twice daily).  Patient requests a face to face session. Taheerah states that she has completed her Christmas shopping and feels no pressure. She has not yet made any Thanksgiving plans with her brother [name]. She talked about her memories of the holidays when she was married and raising her 3 boys. She reports that "being in the service was very difficult on their kids. She refers to this period as very dark and tries not to reflect on it too much.  We talked about making the "most" out of the holiday, and keeping herself occupied.                                                                        [name], PhD Time: 12:35p-1:30p 55 minutes

## 2022-01-16 ENCOUNTER — Ambulatory Visit: Payer: Medicare Other | Attending: Cardiovascular Disease | Admitting: *Deleted

## 2022-01-16 DIAGNOSIS — Z5181 Encounter for therapeutic drug level monitoring: Secondary | ICD-10-CM | POA: Diagnosis not present

## 2022-01-16 DIAGNOSIS — I480 Paroxysmal atrial fibrillation: Secondary | ICD-10-CM | POA: Diagnosis not present

## 2022-01-16 LAB — POCT INR: INR: 3.1 — AB (ref 2.0–3.0)

## 2022-01-16 NOTE — Patient Instructions (Signed)
Description   Tomorrow take 1 tablet and then continue taking Warfarin 1.5 tablets everyday except 2 tablets on Mondays, Wednesdays, and Fridays. Recheck INR in 4 weeks. Call with any medication changes or if you are scheduled for surgery [facility] [phone] Main [phone]

## 2022-01-17 ENCOUNTER — Ambulatory Visit: Payer: Medicare Other | Admitting: Psychology

## 2022-01-22 DIAGNOSIS — G43719 Chronic migraine without aura, intractable, without status migrainosus: Secondary | ICD-10-CM | POA: Diagnosis not present

## 2022-01-22 DIAGNOSIS — G518 Other disorders of facial nerve: Secondary | ICD-10-CM | POA: Diagnosis not present

## 2022-01-22 DIAGNOSIS — M542 Cervicalgia: Secondary | ICD-10-CM | POA: Diagnosis not present

## 2022-01-22 DIAGNOSIS — M791 Myalgia, unspecified site: Secondary | ICD-10-CM | POA: Diagnosis not present

## 2022-01-24 DIAGNOSIS — M545 Low back pain, unspecified: Secondary | ICD-10-CM | POA: Diagnosis not present

## 2022-01-24 DIAGNOSIS — M461 Sacroiliitis, not elsewhere classified: Secondary | ICD-10-CM | POA: Diagnosis not present

## 2022-01-24 DIAGNOSIS — Z79891 Long term (current) use of opiate analgesic: Secondary | ICD-10-CM | POA: Diagnosis not present

## 2022-01-24 DIAGNOSIS — G894 Chronic pain syndrome: Secondary | ICD-10-CM | POA: Diagnosis not present

## 2022-01-24 DIAGNOSIS — M47816 Spondylosis without myelopathy or radiculopathy, lumbar region: Secondary | ICD-10-CM | POA: Diagnosis not present

## 2022-01-31 ENCOUNTER — Ambulatory Visit (INDEPENDENT_AMBULATORY_CARE_PROVIDER_SITE_OTHER): Payer: Medicare Other | Admitting: Psychology

## 2022-01-31 DIAGNOSIS — F319 Bipolar disorder, unspecified: Secondary | ICD-10-CM | POA: Diagnosis not present

## 2022-01-31 NOTE — Progress Notes (Signed)
Meredith [name] is a 71 y.o. female patient   Date: 01/31/2022  Treatment Plan: Diagnosis F31.81 (Bipolar II disorder) [n/a]  Symptoms Depressed or irritable mood. (Status: maintained) -- No Description Entered  Lack of energy. (Status: maintained) -- No Description Entered  Social withdrawal. (Status: maintained) -- No Description Entered  Medication Status compliance  Safety none  If Suicidal or Homicidal State Action Taken: unspecified  Current Risk: low Medications Abilify (Dosage: 91m)  Hydocodone (Dosage: 7.524m  Lamotrigine (Dosage: 20070may)  Lithium (Dosage: 450m9my)  Seroquel (Dosage: 200mg62m)  Objectives Related Problem: Develop healthy cognitive patterns and beliefs about self and the world that lead to alleviation and help prevent the relapse of mood episodes. Description: Verbalize grief, fear, and anger regarding real or imagined losses in life. Target Date: 2022-08-10 Frequency: Daily Modality: individual Progress: 80%  Related Problem: Develop healthy cognitive patterns and beliefs about self and the world that lead to alleviation and help prevent the relapse of mood episodes. Description: Take prescribed medications as directed. Target Date: 2022-02-08 Frequency: Daily Modality: individual Progress: 100%  Related Problem: Develop healthy cognitive patterns and beliefs about self and the world that lead to alleviation and help prevent the relapse of mood episodes. Description: Describe mood state, energy level, amount of control over thoughts, and sleeping pattern. Target Date: 2022-08-10 Frequency: Daily Modality: individual Progress: 75%  Client Response full compliance  Service Location Location, [address] [id number] Code cpt [id number]. testing  Related past to present   Self-monitoring  Self care activities  Emotion regulation skills  Validate/empathize  Facilitate problem solving  Normalize/Reframe  Journaling  Comments  Dx.: F31.81  Goals: States she is seeking emotional stability. She needs guidance in managing some of her physical/medical challenges and limitations. Also, have some interpersonal challenges and seeks feedback on addressing these difficulties.Revised target date is 6-24.  Meds: Seroquel (300mg 73my), Hydrocodone (7.5 mg up to 3x/day), Buspar (10mg t51m daily).  Patient requests a face to face session. Meredith Joylenehat they had an enjoyable time [place] she and her brother will make last minute plans for Christmas. Meredith [name] her son on his birthday after Thanksgiving. She found out that one [place] grandson[place] said that she may call her son to get more details. She would like to see him on [place] next trip to [place] never met any of John's (step) sons. She talked about being sad that she is close to two son's but estranged from the other (Bill).Data processing managereality, however, she has had very little contact with John. SJenny Reichmannets a terrible feeling when she thinks of contacting him even though they have had good interactions. Nobody (except occasionally John) has contact with Bill.  Her depression is worse right now and she is lacking motivation to do anything. Will anticipate it gets better after the holiday.  [name], PhD Time: 1:15p-2:00p 45 minutes

## 2022-02-05 ENCOUNTER — Other Ambulatory Visit: Payer: Self-pay | Admitting: Psychiatry

## 2022-02-05 DIAGNOSIS — F3132 Bipolar disorder, current episode depressed, moderate: Secondary | ICD-10-CM

## 2022-02-08 ENCOUNTER — Other Ambulatory Visit: Payer: Self-pay | Admitting: Internal Medicine

## 2022-02-08 DIAGNOSIS — M533 Sacrococcygeal disorders, not elsewhere classified: Secondary | ICD-10-CM | POA: Diagnosis not present

## 2022-02-13 ENCOUNTER — Ambulatory Visit: Payer: Medicare Other | Attending: Cardiology | Admitting: *Deleted

## 2022-02-13 DIAGNOSIS — I480 Paroxysmal atrial fibrillation: Secondary | ICD-10-CM | POA: Diagnosis not present

## 2022-02-13 DIAGNOSIS — Z5181 Encounter for therapeutic drug level monitoring: Secondary | ICD-10-CM

## 2022-02-13 LAB — POCT INR: INR: 4.6 — AB (ref 2.0–3.0)

## 2022-02-13 NOTE — Patient Instructions (Addendum)
Description   Since you have already taken today's dose then do not take any warfarin Wednesday and no warfarin Thursday then start taking Warfarin 1.5 tablets everyday except 2 tablets on Mondays and Fridays. Recheck INR in 2 weeks. Call with any medication changes or if you are scheduled for surgery [facility] [phone] Main [phone]

## 2022-02-14 ENCOUNTER — Ambulatory Visit (INDEPENDENT_AMBULATORY_CARE_PROVIDER_SITE_OTHER): Payer: Medicare Other | Admitting: Psychology

## 2022-02-14 DIAGNOSIS — F319 Bipolar disorder, unspecified: Secondary | ICD-10-CM

## 2022-02-14 NOTE — Progress Notes (Signed)
   Meredith [name] is a 71 y.o. female patient   Date: 02/14/2022  Treatment Plan: Diagnosis F31.81 (Bipolar II disorder) [n/a]  Symptoms Depressed or irritable mood. (Status: maintained) -- No Description Entered  Lack of energy. (Status: maintained) -- No Description Entered  Social withdrawal. (Status: maintained) -- No Description Entered  Medication Status compliance  Safety none  If Suicidal or Homicidal State Action Taken: unspecified  Current Risk: low Medications Abilify (Dosage: '30mg'$ )  Hydocodone (Dosage: 7.'5mg'$ )  Lamotrigine (Dosage: '200mg'$ /day)  Lithium (Dosage: '450mg'$ /day)  Seroquel (Dosage: '200mg'$ /day)  Objectives Related Problem: Develop healthy cognitive patterns and beliefs about self and the world that lead to alleviation and help prevent the relapse of mood episodes. Description: Verbalize grief, fear, and anger regarding real or imagined losses in life. Target Date: 2022-08-10 Frequency: Daily Modality: individual Progress: 80%  Related Problem: Develop healthy cognitive patterns and beliefs about self and the world that lead to alleviation and help prevent the relapse of mood episodes. Description: Take prescribed medications as directed. Target Date: 2022-02-08 Frequency: Daily Modality: individual Progress: 100%  Related Problem: Develop healthy cognitive patterns and beliefs about self and the world that lead to alleviation and help prevent the relapse of mood episodes. Description: Describe mood state, energy level, amount of control over thoughts, and sleeping pattern. Target Date: 2022-08-10 Frequency: Daily Modality: individual Progress: 75%  Client Response full compliance  Service Location Location, [name] [place], [place] [address]  Service Code cpt [phone] P Neuropsych. testing  Related past to present  Self-monitoring  Self care activities  Emotion regulation skills  Validate/empathize  Facilitate problem solving   Normalize/Reframe  Journaling  Comments  Dx.: F31.81  Goals: States she is seeking emotional stability. She needs guidance in managing some of her physical/medical challenges and limitations. Also, have some interpersonal challenges and seeks feedback on addressing these difficulties.Revised target date is 6-24.  Meds: Seroquel ('300mg'$  daily), Hydrocodone (7.5 mg up to 3x/day), Buspar ('10mg'$  twice daily).  Patient was seen via video. She is at home and I am at home office. Meredith [name] and her brother are going to be with family in [place] for Christmas. She called her oldest son and got a message that his voicemail had not been set up. She called her younger son and left a message, but he never returned her call. She is disappointed, but will not pursue. Depression is stable. No significant change.                                                  [name], PhD Time: 1:15p-2:00p 45 minutes

## 2022-02-16 ENCOUNTER — Other Ambulatory Visit: Payer: Self-pay | Admitting: Internal Medicine

## 2022-02-16 DIAGNOSIS — I480 Paroxysmal atrial fibrillation: Secondary | ICD-10-CM

## 2022-02-16 NOTE — Telephone Encounter (Signed)
Last INR 02/13/2022  Last OV 03/03/2021

## 2022-02-22 DIAGNOSIS — M461 Sacroiliitis, not elsewhere classified: Secondary | ICD-10-CM | POA: Diagnosis not present

## 2022-02-22 DIAGNOSIS — Z79891 Long term (current) use of opiate analgesic: Secondary | ICD-10-CM | POA: Diagnosis not present

## 2022-02-22 DIAGNOSIS — M47816 Spondylosis without myelopathy or radiculopathy, lumbar region: Secondary | ICD-10-CM | POA: Diagnosis not present

## 2022-02-22 DIAGNOSIS — G894 Chronic pain syndrome: Secondary | ICD-10-CM | POA: Diagnosis not present

## 2022-02-22 DIAGNOSIS — M545 Low back pain, unspecified: Secondary | ICD-10-CM | POA: Diagnosis not present

## 2022-02-27 DIAGNOSIS — G518 Other disorders of facial nerve: Secondary | ICD-10-CM | POA: Diagnosis not present

## 2022-02-27 DIAGNOSIS — M791 Myalgia, unspecified site: Secondary | ICD-10-CM | POA: Diagnosis not present

## 2022-02-27 DIAGNOSIS — M542 Cervicalgia: Secondary | ICD-10-CM | POA: Diagnosis not present

## 2022-02-27 DIAGNOSIS — G43719 Chronic migraine without aura, intractable, without status migrainosus: Secondary | ICD-10-CM | POA: Diagnosis not present

## 2022-02-28 ENCOUNTER — Ambulatory Visit (INDEPENDENT_AMBULATORY_CARE_PROVIDER_SITE_OTHER): Payer: Medicare Other | Admitting: Psychology

## 2022-02-28 DIAGNOSIS — F319 Bipolar disorder, unspecified: Secondary | ICD-10-CM | POA: Diagnosis not present

## 2022-02-28 NOTE — Progress Notes (Signed)
    Meredith [name] is a 72 y.o. female patient   Date: 02/28/2022  Treatment Plan: Diagnosis F31.81 (Bipolar II disorder) [n/a]  Symptoms Depressed or irritable mood. (Status: maintained) -- No Description Entered  Lack of energy. (Status: maintained) -- No Description Entered  Social withdrawal. (Status: maintained) -- No Description Entered  Medication Status compliance  Safety none  If Suicidal or Homicidal State Action Taken: unspecified  Current Risk: low Medications Abilify (Dosage: 67m)  Hydocodone (Dosage: 7.572m  Lamotrigine (Dosage: 20060may)  Lithium (Dosage: 450m76my)  Seroquel (Dosage: 200mg63m)  Objectives Related Problem: Develop healthy cognitive patterns and beliefs about self and the world that lead to alleviation and help prevent the relapse of mood episodes. Description: Verbalize grief, fear, and anger regarding real or imagined losses in life. Target Date: 2022-08-10 Frequency: Daily Modality: individual Progress: 80%  Related Problem: Develop healthy cognitive patterns and beliefs about self and the world that lead to alleviation and help prevent the relapse of mood episodes. Description: Take prescribed medications as directed. Target Date: 2022-02-08 Frequency: Daily Modality: individual Progress: 100%  Related Problem: Develop healthy cognitive patterns and beliefs about self and the world that lead to alleviation and help prevent the relapse of mood episodes. Description: Describe mood state, energy level, amount of control over thoughts, and sleeping pattern. Target Date: 2022-08-10 Frequency: Daily Modality: individual Progress: 75%  Client Response full compliance  Service Location Location, [address] [id number] Code cpt [id number]. testing  Related past to present  Self-monitoring  Self care activities  Emotion regulation skills  Validate/empathize  Facilitate problem solving   Normalize/Reframe  Journaling  Comments  Dx.: F31.81  Goals: States she is seeking emotional stability. She needs guidance in managing some of her physical/medical challenges and limitations. Also, have some interpersonal challenges and seeks feedback on addressing these difficulties.Revised target date is 6-24.  Meds: Seroquel (300mg 1my), Hydrocodone (7.5 mg up to 3x/day), Buspar (10mg t72m daily).  Patient was seen via video. She is at home and I am at home office. Meredith [name] [place]. She reached out to her grandson who now works at [place]rex Corporation[place] there and her son (father of grandson) was visiting there as well. Her son drove an hour to come and visit. Grandson, whom she had never met, did not come. Meredith [name] met her grandchildren. She is feeling positive about the visit and the prospect of future relationship.       .      [name]                                           Meredith [name]: 1:10p-2:00p 50 minutes

## 2022-03-01 ENCOUNTER — Ambulatory Visit: Payer: Medicare Other | Attending: Cardiovascular Disease | Admitting: *Deleted

## 2022-03-01 DIAGNOSIS — I480 Paroxysmal atrial fibrillation: Secondary | ICD-10-CM

## 2022-03-01 DIAGNOSIS — Z5181 Encounter for therapeutic drug level monitoring: Secondary | ICD-10-CM | POA: Diagnosis not present

## 2022-03-01 LAB — POCT INR: INR: 4 — AB (ref 2.0–3.0)

## 2022-03-01 NOTE — Patient Instructions (Signed)
Description   Since you have already taken today's dose then do not take any warfarin tomorrow then start taking Warfarin 1.5 tablets. Recheck INR in 2 weeks. Call with any medication changes or if you are scheduled for surgery [facility] [phone] Main [phone]

## 2022-03-06 ENCOUNTER — Ambulatory Visit (INDEPENDENT_AMBULATORY_CARE_PROVIDER_SITE_OTHER): Payer: Medicare Other | Admitting: Psychiatry

## 2022-03-06 ENCOUNTER — Encounter: Payer: Self-pay | Admitting: Psychiatry

## 2022-03-06 DIAGNOSIS — F4001 Agoraphobia with panic disorder: Secondary | ICD-10-CM

## 2022-03-06 DIAGNOSIS — F3132 Bipolar disorder, current episode depressed, moderate: Secondary | ICD-10-CM

## 2022-03-06 DIAGNOSIS — Z8669 Personal history of other diseases of the nervous system and sense organs: Secondary | ICD-10-CM

## 2022-03-06 DIAGNOSIS — F5105 Insomnia due to other mental disorder: Secondary | ICD-10-CM

## 2022-03-06 DIAGNOSIS — F319 Bipolar disorder, unspecified: Secondary | ICD-10-CM | POA: Diagnosis not present

## 2022-03-06 DIAGNOSIS — F411 Generalized anxiety disorder: Secondary | ICD-10-CM | POA: Diagnosis not present

## 2022-03-06 DIAGNOSIS — G3184 Mild cognitive impairment, so stated: Secondary | ICD-10-CM | POA: Diagnosis not present

## 2022-03-06 DIAGNOSIS — F4310 Post-traumatic stress disorder, unspecified: Secondary | ICD-10-CM

## 2022-03-06 MED ORDER — QUETIAPINE FUMARATE 300 MG PO TABS: 300.0000 mg | ORAL_TABLET | Freq: Every day | ORAL | 1 refills | Status: DC

## 2022-03-06 MED ORDER — TRAZODONE HCL 50 MG PO TABS: 50.0000 mg | ORAL_TABLET | Freq: Every evening | ORAL | 1 refills | Status: DC | PRN

## 2022-03-06 MED ORDER — BUSPIRONE HCL 15 MG PO TABS: 15.0000 mg | ORAL_TABLET | Freq: Two times a day (BID) | ORAL | 1 refills | Status: DC

## 2022-03-06 NOTE — Progress Notes (Signed)
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 inappropriate.        Speech: Speech normal. Speech is not rapid and pressured or slurred.        Behavior: Behavior normal.        Thought Content: Thought content normal. Thought content is not delusional. Thought content does not include homicidal or suicidal ideation. Thought content does not include suicidal plan.        Cognition and Memory: Cognition is not impaired. She exhibits impaired recent memory.     Comments: Insight and judgment fair No delusions.  Cognition appears baseline but she feels it's impaired Neat and pleasant Talkative without pressure.      Lab Review:     Component Value Date/Time   NA 140 12/12/2020 1548   NA 141 12/07/2019 1022   K 4.5 12/12/2020 1548   CL 107 12/12/2020 1548   CO2 26 12/12/2020 1548   GLUCOSE 94 12/12/2020 1548   BUN 14 12/12/2020 1548   BUN 15 12/07/2019 1022   CREATININE 0.99 12/12/2020 1548   CREATININE 1.02 (H) 11/11/2020 1116   CALCIUM 9.5 12/12/2020 1548   PROT 7.1 08/16/2020 1451   ALBUMIN 4.5 08/16/2020 1451   AST 14 (L)  08/16/2020 1451   ALT 10 08/16/2020 1451   ALKPHOS 56 08/16/2020 1451   BILITOT 0.8 08/16/2020 1451   GFRNONAA >60 12/12/2020 1548   GFRNONAA 61 09/02/2020 1552   GFRAA 71 09/02/2020 1552       Component Value Date/Time   WBC 5.5 03/13/2021 1414   WBC 5.7 11/11/2020 1116   RBC 4.60 03/13/2021 1414   RBC 4.45 11/11/2020 1116   HGB 13.9 03/13/2021 1414   HCT 40.0 03/13/2021 1414   PLT 193 03/13/2021 1414   MCV 87 03/13/2021 1414   MCH 30.2 03/13/2021 1414   MCH 29.7 11/11/2020 1116   MCHC 34.8 03/13/2021 1414   MCHC 32.8 11/11/2020 1116  RDW 15.0 03/13/2021 1414   LYMPHSABS 1,448 11/11/2020 1116   MONOABS 0.9 07/15/2019 1150   EOSABS 479 11/11/2020 1116   BASOSABS 63 11/11/2020 1116    Lithium Lvl  Date Value Ref Range Status  08/18/2020 0.17 (L) 0.60 - 1.20 mmol/L Final    Comment:    Performed at [facility], [place] [address], [place], [place] [address]    May 20, 2019 lithium level 0.9 on 450 mg daily and creatinine was 1.1 with normal calcium 07/21/19 lithium 0.4 on 450 mg daily, normal CMP  No results found for: "PHENYTOIN", "PHENOBARB", "VALPROATE", "CBMZ"   .res Assessment: Plan:     Karagan was seen today for follow-up, anxiety, post-traumatic stress disorder and depression.  Diagnoses and all orders for this visit:  Bipolar affective disorder, rapid cycling (HCC)  Generalized anxiety disorder -     busPIRone (BUSPAR) 15 MG tablet; Take 1 tablet (15 mg total) by mouth 2 (two) times daily.  Panic disorder with agoraphobia -     busPIRone (BUSPAR) 15 MG tablet; Take 1 tablet (15 mg total) by mouth 2 (two) times daily.  PTSD (post-traumatic stress disorder) -     busPIRone (BUSPAR) 15 MG tablet; Take 1 tablet (15 mg total) by mouth 2 (two) times daily.  Mild cognitive impairment  Insomnia due to mental condition -     traZODone (DESYREL) 50 MG tablet; Take 1 tablet (50 mg total) by mouth at bedtime as needed for sleep.  History of  encephalopathy  Bipolar 1 disorder, depressed, moderate (HCC) -     QUEtiapine (SEROQUEL) 300 MG tablet; Take 1 tablet (300 mg total) by mouth at bedtime.  History of lithium toxicity Hx repeated bouts of encephalopathy  Greater than 50% of 30 min face to face time with patient was spent on counseling and coordination of care. We discussed the following.  Tonda is a chronically mentally ill patient with chronic depression and chronic anxiety and multiple med failures.  Overall is more stable psychiatrically on lower-dose quetiapine than in the past    [facility] for acute encephalopathy 07/2020.  The cause was unknown but presumed to be medication related.   Cognition appears stable.  Current psych meds Seroquel 300 HS are preventing mood cycling and helping depression Continue trazodone 50 mg HS Continue restarted buspirone 15 BID helped anxiety.  Using pillbox to help compliance.  Disc danger of mixing up or doubling up meds.  She fills box herself.  Rec she get help with this. Cannot afford branded meds which prevents [place] from using some of the bipolar depression meds like Vraylar.  Few options for depression and anxiety therefore increased Seroquel to 300 mg HS  has doing ok with this so far. Caution re: sedative risks emphasiszed and fall risk.  Option Trileptal not as good for depression.  Push fluids.  Has to remind herself. Emphasized again.  Says she's doing well with this.   Discussed potential metabolic side effects associated with atypical antipsychotics, as well as potential risk for movement side effects. Advised pt to contact office if movement side effects occur.   OK Ativan prn for dental procedure 1-2 mg.  No driving after it.  PCP Elyn Peers, Genelle Bal  Continue therapy with Dr. Cheryln Manly q 2 weeks. Consider better medication coverage when gets new insurance Discussed this issue with her again although I think she again shows a cheaper plan which will limit  medication options. Supportive therapy dealing with ill  friends.  No med changes  Follow-up  3-4  mos  Lynder Parents MD, DFAPA  Please see After Visit Summary for patient specific instructions.  Future Appointments  Date Time Provider [place]  03/09/2022  3:40 PM Werner Lean, MD [facility]  03/14/2022  1:00 PM Oren Binet, PhD [facility] None  03/15/2022  1:00 PM [facility] None  03/28/2022  1:00 PM Oren Binet, PhD [facility] None  04/11/2022  1:00 PM Oren Binet, PhD [facility] None  04/25/2022  1:00 PM Oren Binet, PhD [facility] None  05/09/2022  1:00 PM Oren Binet, PhD [facility] None  05/23/2022  1:00 PM Oren Binet, PhD [facility] None  06/06/2022  1:00 PM Oren Binet, PhD [facility] None  06/20/2022  1:00 PM Oren Binet, PhD [facility] None  07/04/2022  1:00 PM Oren Binet, PhD [facility] None  07/18/2022  1:00 PM Oren Binet, PhD [facility] None  08/01/2022  1:00 PM Oren Binet, PhD [facility] None  08/15/2022  1:00 PM Oren Binet, PhD [facility] None    No orders of the defined types were placed in this encounter.      -------------------------------

## 2022-03-09 ENCOUNTER — Other Ambulatory Visit: Payer: Self-pay | Admitting: Psychiatry

## 2022-03-09 ENCOUNTER — Encounter: Payer: Self-pay | Admitting: Internal Medicine

## 2022-03-09 ENCOUNTER — Ambulatory Visit: Payer: Medicare Other | Attending: Internal Medicine | Admitting: Internal Medicine

## 2022-03-09 VITALS — BP 124/62 | HR 76 | Ht 58.5 in | Wt 187.0 lb

## 2022-03-09 DIAGNOSIS — I480 Paroxysmal atrial fibrillation: Secondary | ICD-10-CM

## 2022-03-09 DIAGNOSIS — F411 Generalized anxiety disorder: Secondary | ICD-10-CM

## 2022-03-09 DIAGNOSIS — F4310 Post-traumatic stress disorder, unspecified: Secondary | ICD-10-CM

## 2022-03-09 DIAGNOSIS — F5105 Insomnia due to other mental disorder: Secondary | ICD-10-CM

## 2022-03-09 DIAGNOSIS — F4001 Agoraphobia with panic disorder: Secondary | ICD-10-CM

## 2022-03-09 MED ORDER — FUROSEMIDE 20 MG PO TABS
20.0000 mg | ORAL_TABLET | Freq: Every day | ORAL | 11 refills | Status: DC | PRN
Start: 2022-03-09 — End: 2023-06-04

## 2022-03-09 NOTE — Patient Instructions (Signed)
Medication Instructions:  Your physician has recommended you make the following change in your medication:  START: furosemide (Lasix) 20 mg by mouth once daily as needed for swelling, shortness of breath, 2-3 pound weight gain over night or 5 lbs in 1 week  *If you need a refill on your cardiac medications before your next appointment, please call your pharmacy*   Lab Work: NONE If you have labs (blood work) drawn today and your tests are completely normal, you will receive your results only by: [place] (if you have MyChart) OR A paper copy in the mail If you have any lab test that is abnormal or we need to change your treatment, we will call you to review the results.   Testing/Procedures: Your physician has referred you to see an Electrophysiologist.    Follow-Up: At [facility], you and your health needs are our priority.  As part of our continuing mission to provide you with exceptional heart care, we have created designated Provider Care Teams.  These Care Teams include your primary Cardiologist (physician) and Advanced Practice Providers (APPs -  Physician Assistants and Nurse Practitioners) who all work together to provide you with the care you need, when you need it.  We recommend signing up for the patient portal called "MyChart".  Sign up information is provided on this After Visit Summary.  MyChart is used to connect with patients for Virtual Visits (Telemedicine).  Patients are able to view lab/test results, encounter notes, upcoming appointments, etc.  Non-urgent messages can be sent to your provider as well.   To learn more about what you can do with MyChart, go to [email or web address].    Your next appointment:   1 year(s)  Provider:   Werner Lean, MD

## 2022-03-09 NOTE — Progress Notes (Signed)
Cardiology Office Note:    Date:  03/09/2022   ID:  [name], DOB March 04, 1950, MRN [id number]  PCP:  Kristen Loader, FNP   [facility] Providers Cardiologist:  Werner Lean, MD     Referring MD: Kristen Loader, FNP   CC: One year f/u for AF  History of Present Illness:    Cecillia Menees is a 72 y.o. female with a hx of PAF s/p 3 ablations (last cryoablations last in 2018) in [place], DM, bipolar on chronic lithium, prior acute fall with no bleeding issues and maintained on coumadin.    Re-established in 2023.  Patient notes that she is doing OK.   She has been having some INRs to 4 and sees them last week.   There are no interval hospital/ED visit.    No chest pain or pressure .  No SOB/DOE and no PND/Orthopnea.  No weight gain or leg swelling.  No palpitations or syncope.  Last AF episode 2022.  She is usually symptomatic when she is in atrial fibrillation.   Past Medical History:  Diagnosis Date   Anxiety    Atrial fibrillation (HCC)    Atrial fibrillation (HCC)    Bipolar 2 disorder ([place])    Chronic kidney disease    Depression    GERD (gastroesophageal reflux disease)    Hematoma 07/06/2020   History of cardioversion    x2   History of cardioversion    Hyperlipidemia    Hypothyroidism    Ingrown toenail 10/10/2020   Migraine headache    Osteomyelitis of foot ([place]) 11/11/2020   Osteoporosis    Pre-diabetes    Septic arthritis of interphalangeal joint of toe of right foot ([place]) 07/06/2020    Past Surgical History:  Procedure Laterality Date   ABLATION     BACK SURGERY  2014   CARDIOVERSION  12/2013, 01/2014   x2   HAMMERTOE RECONSTRUCTION WITH WEIL OSTEOTOMY Right 12/15/2020   Procedure: Right Second metarsal weil osteotomy; Second hammer toe correction;  Surgeon: [name], MD;  Location: [facility];  Service: Orthopedics;  Laterality: Right;   KNEE SURGERY     ORTHOSCOPIC   LUNG REMOVAL, PARTIAL  Right    BENIGN LUNG CYST   SYNOVIAL BIOPSY Right 06/16/2020   Procedure: Right hallux interphalangeal joint arthrotomy and biopsy;  Surgeon: [name], MD;  Location: [facility];  Service: Orthopedics;  Laterality: Right;   THORACIC SPINE SURGERY Right    THUMB ARTHROSCOPY      Current Medications: Current Meds  Medication Sig   alendronate (FOSAMAX) 35 MG tablet Take 35 mg by mouth once a week.   Ascorbic Acid (VITAMIN C) 1000 MG tablet Take 1,000 mg by mouth daily.   aspirin 325 MG tablet Take 325 mg by mouth daily as needed for headache.    baclofen (LIORESAL) 10 MG tablet Take 1 tablet by mouth as needed.   bisacodyl (DULCOLAX) 5 MG EC tablet PRN   busPIRone (BUSPAR) 15 MG tablet Take 1 tablet (15 mg total) by mouth 2 (two) times daily.   Cholecalciferol (VITAMIN D3) 125 MCG (5000 UT) TABS Take 1 tablet by mouth daily at 12 noon.   clotrimazole (LOTRIMIN) 1 % cream as needed (skin irritation).   docusate sodium (COLACE) 100 MG capsule Take 100 mg by mouth 2 (two) times daily.   EPINEPHrine 0.3 mg/0.3 mL IJ SOAJ injection    estradiol (ESTRACE) 1 MG tablet Take 1 mg  by mouth daily.   ferrous sulfate 325 (65 FE) MG tablet Take 325 mg by mouth 2 (two) times daily.   furosemide (LASIX) 20 MG tablet Take 1 tablet (20 mg total) by mouth daily as needed.   Galcanezumab-gnlm (EMGALITY) 120 MG/ML SOAJ Inject into the skin every 30 (thirty) days.   HYDROcodone-acetaminophen (NORCO) 7.5-325 MG tablet Take 1 tablet by mouth 3 (three) times daily as needed for moderate pain or severe pain.   ipratropium (ATROVENT) 0.03 % nasal spray 1-2 sprays in each nostril   levocetirizine (XYZAL) 5 MG tablet Take 5 mg by mouth every evening.   levothyroxine (SYNTHROID) 112 MCG tablet Take 112 mcg by mouth daily before breakfast.   loperamide (IMODIUM) 2 MG capsule Take 1 capsule (2 mg total) by mouth as needed for diarrhea or loose stools.   Melatonin 5 MG TABS Take 20 mg by mouth at  bedtime.   metoprolol succinate (TOPROL-XL) 50 MG 24 hr tablet TAKE 1 TABLET BY MOUTH ONCE DAILY WITH  OR  IMMEDIATELY  FOLLOWING  A  MEAL   Multiple Vitamin (MULTIVITAMIN WITH MINERALS) TABS tablet Take 1 tablet by mouth daily.   naloxone (NARCAN) 0.4 MG/ML injection    ondansetron (ZOFRAN) 4 MG tablet Take 1 tablet (4 mg total) by mouth every 8 (eight) hours as needed for nausea or vomiting.   pantoprazole (PROTONIX) 40 MG tablet Take 1 tablet (40 mg total) by mouth daily.   pravastatin (PRAVACHOL) 20 MG tablet Take 20 mg by mouth daily.   pregabalin (LYRICA) 150 MG capsule Take 150 mg by mouth 3 (three) times daily.   QUEtiapine (SEROQUEL) 300 MG tablet Take 1 tablet (300 mg total) by mouth at bedtime.   traZODone (DESYREL) 50 MG tablet Take 1 tablet (50 mg total) by mouth at bedtime as needed for sleep.   verapamil (VERELAN PM) 120 MG 24 hr capsule Take 1 capsule (120 mg total) by mouth at bedtime. Hold for low blood pressures   vitamin B-12 1000 MCG tablet Take 1 tablet (1,000 mcg total) by mouth daily.   warfarin (COUMADIN) 2.5 MG tablet TAKE 1 TO 1 & 1/2 (ONE TO ONE & ONE-HALF) TABLETS BY MOUTH ONCE DAILY OR  AS  DIRECTED  BY  COUMADIN  CLINIC   [DISCONTINUED] HYDROmorphone (DILAUDID) 2 MG tablet as needed.     Allergies:   Codeine, Adhesive [tape], Celebrex [celecoxib], Erythromycin, Other, Penicillamine, Sulfa antibiotics, Tricyclic antidepressants, Amoxicillin, Ampicillin, Cephalexin, Macrodantin [nitrofurantoin], and Penicillins   Social History   Socioeconomic History   Marital status: Single    Spouse name: Not on file   Number of children: 3   Years of education: Not on file   Highest education level: Not on file  Occupational History   Occupation: Retired  Tobacco Use   Smoking status: Never   Smokeless tobacco: Never  Vaping Use   Vaping Use: Not on file  Substance and Sexual Activity   Alcohol use: Not Currently   Drug use: No   Sexual activity: Not Currently   Other Topics Concern   Not on file  Social History Narrative   Not on file   Social Determinants of Health   Financial Resource Strain: Not on file  Food Insecurity: Not on file  Transportation Needs: Not on file  Physical Activity: Not on file  Stress: Not on file  Social Connections: Not on file     Family History: The patient's family history includes Alcohol abuse in her brother; Arthritis  in her mother; Asthma in her brother; Atrial fibrillation in her brother; Depression in her mother; Diabetes in her mother; Drug abuse in her brother; Early death in her father; Heart disease in her father and mother; Hyperlipidemia in her brother; Hypertension in her brother; Mental illness in her brother; Multiple sclerosis in her sister; Stroke in her mother.  ROS:   Please see the history of present illness.     All other systems reviewed and are negative.  EKGs/Labs/Other Studies Reviewed:     Cardiac Studies & Procedures       ECHOCARDIOGRAM  ECHOCARDIOGRAM COMPLETE 08/18/2020  Narrative ECHOCARDIOGRAM REPORT    Patient Name:   VELNA HEDGECOCK [place] Date of Exam: 08/18/2020 Medical Rec #:  [id number]            Height:       59.0 in Accession #:    [id number]           Weight:       179.1 lb Date of Birth:  05/09/1950             BSA:          1.760 m Patient Age:    21 years             BP:           128/62 mmHg Patient Gender: F                    HR:           70 bpm. Exam Location:  Inpatient  Procedure: 2D Echo  Indications:    TIA  History:        Patient has prior history of Echocardiogram examinations, most recent 10/23/2018. Arrythmias:Atrial Fibrillation; Signs/Symptoms:Altered Mental Status.  Sonographer:    Johny Chess Referring Phys: [id number] JENNIFER YATES   Sonographer Comments: Suboptimal subcostal window. IMPRESSIONS   1. Left ventricular ejection fraction, by estimation, is 60 to 65%. The left ventricle has normal function. The left ventricle  has no regional wall motion abnormalities. Left ventricular diastolic function could not be evaluated. 2. Right ventricular systolic function is normal. The right ventricular size is normal. There is mildly elevated pulmonary artery systolic pressure. 3. Left atrial size was mildly dilated. 4. The mitral valve is normal in structure. Mild mitral valve regurgitation. No evidence of mitral stenosis. 5. The aortic valve is tricuspid. Aortic valve regurgitation is mild. No aortic stenosis is present.  FINDINGS Left Ventricle: Left ventricular ejection fraction, by estimation, is 60 to 65%. The left ventricle has normal function. The left ventricle has no regional wall motion abnormalities. The left ventricular internal cavity size was normal in size. There is no left ventricular hypertrophy. Left ventricular diastolic function could not be evaluated due to atrial fibrillation. Left ventricular diastolic function could not be evaluated.  Right Ventricle: The right ventricular size is normal.Right ventricular systolic function is normal. There is mildly elevated pulmonary artery systolic pressure. The tricuspid regurgitant velocity is 3.08 m/s, and with an assumed right atrial pressure of 3 mmHg, the estimated right ventricular systolic pressure is 35.3 mmHg.  Left Atrium: Left atrial size was mildly dilated.  Right Atrium: Right atrial size was normal in size.  Pericardium: There is no evidence of pericardial effusion.  Mitral Valve: The mitral valve is normal in structure. Mild mitral valve regurgitation. No evidence of mitral valve stenosis.  Tricuspid Valve: The tricuspid valve is normal in structure. Tricuspid valve regurgitation is mild .  No evidence of tricuspid stenosis.  Aortic Valve: The aortic valve is tricuspid. Aortic valve regurgitation is mild. No aortic stenosis is present.  Pulmonic Valve: The pulmonic valve was normal in structure. Pulmonic valve regurgitation is not visualized.  No evidence of pulmonic stenosis.  Aorta: The aortic root is normal in size and structure.  Venous: The inferior vena cava was not well visualized.  IAS/Shunts: The interatrial septum was not well visualized.   LEFT VENTRICLE PLAX 2D LVIDd:         3.90 cm LVIDs:         2.50 cm LV PW:         0.90 cm LV IVS:        1.00 cm LVOT diam:     1.90 cm LV SV:         68 LV SV Index:   39 LVOT Area:     2.84 cm   RIGHT VENTRICLE RV S prime:     16.20 cm/s  LEFT ATRIUM             Index       RIGHT ATRIUM           Index LA diam:        3.40 cm 1.93 cm/m  RA Area:     11.80 cm LA Vol (A2C):   59.2 ml 33.64 ml/m RA Volume:   25.20 ml  14.32 ml/m LA Vol (A4C):   59.8 ml 33.98 ml/m LA Biplane Vol: 60.5 ml 34.38 ml/m AORTIC VALVE LVOT Vmax:   113.00 cm/s LVOT Vmean:  75.700 cm/s LVOT VTI:    0.241 m  AORTA Ao Root diam: 3.00 cm Ao Asc diam:  3.10 cm  TRICUSPID VALVE TR Peak grad:   37.9 mmHg TR Vmax:        308.00 cm/s  SHUNTS Systemic VTI:  0.24 m Systemic Diam: 1.90 cm  Kirk Ruths MD Electronically signed by Kirk Ruths MD Signature Date/Time: 08/18/2020/12:26:29 PM    Final                Recent Labs: 03/13/2021: Hemoglobin 13.9; Platelets 193  Recent Lipid Panel    Component Value Date/Time   CHOL 263 (H) 08/18/2020 0010   TRIG 106 08/18/2020 0010   HDL 111 08/18/2020 0010   CHOLHDL 2.4 08/18/2020 0010   VLDL 21 08/18/2020 0010   LDLCALC 131 (H) 08/18/2020 0010        Physical Exam:    VS:  BP 124/62   Pulse 76   Ht 4' 10.5" (1.486 m)   Wt 187 lb (84.8 kg)   LMP  (LMP Unknown)   SpO2 97%   BMI 38.42 kg/m     Wt Readings from Last 3 Encounters:  03/09/22 187 lb (84.8 kg)  03/03/21 172 lb (78 kg)  12/15/20 175 lb 14.8 oz (79.8 kg)    Gen: No distress  Neck: No JVD Cardiac: No Rubs or Gallops,    regular,  radial pulses Respiratory: Clear to auscultation bilaterally, normal effort, normal  respiratory rate GI: Soft,  nontender, non-distended  MS: Trace bilaterally edema;  moves all extremities Integument: Skin feels warm Neuro:  At time of evaluation, alert and oriented to person/place/time/situation  Psych: Normal affect, patient feels ok but has headache   ASSESSMENT:    1. PAF (paroxysmal atrial fibrillation) (HCC)     PLAN:    Paroxysmal Atrial Fibrillation - Risk factors include DM- CHADSVASC=4. - will continue coumadin;  given that her migraines cause issues and she can only take ASA for them and her labile INRs, Watchman Would be reasonable - continue metoprolol succinate  [facility] Referral for Left Atrial Appendage Closure with Non-Valvular Atrial Fibrillation   Catrinia Sade Hollon is a 72 y.o. female is being referred to the [facility] Team for evaluation for Left Atrial Appendage Closure with Watchman device for the management of stroke risk resulting form non-valvular atrial fibrillation.    Base upon Ms. Rauf's history, she is felt to be a poor candidate for long-term anticoagulation because of an inability or significant difficulty with maintaining patient in therapeutic range.  The patient has a HAS-BLED score of 4 indicating a Yearly Major Bleeding Risk of 8.7%.      Her CHADS2-VASc Score is   with an unadjusted Ischemic Stroke Rate (% per year) of  %.    Her stroke risk necessitates a strategy of stroke prevention with either long-term oral anticoagulation or left atrial appendage occlusion therapy. We have discussed their bleeding risk in the context of their comorbid medical problems, as well as the rationale for referral for evaluation of Watchman left atrial appendage occlusion therapy. While the patient is at high long-term bleeding risk, they may be appropriate for short-term anticoagulation. Based on this individual patient's stroke and bleeding risk, a shared decision has been made to refer the patient for consideration of Watchman left atrial  appendage closure utilizing the [employer] of Cardiology shared decision tool.    HTN with DM Morbid Obesity LE edema - PRN lasix - historically controlled with current therapy - continue verapamil  HLD - continue pravastatin   One year with me     Medication Adjustments/Labs and Tests Ordered: Current medicines are reviewed at length with the patient today.  Concerns regarding medicines are outlined above.  Orders Placed This Encounter  Procedures   Ambulatory referral to Cardiac Electrophysiology   EKG 12-Lead   Meds ordered this encounter  Medications   furosemide (LASIX) 20 MG tablet    Sig: Take 1 tablet (20 mg total) by mouth daily as needed.    Dispense:  30 tablet    Refill:  11    Patient Instructions  Medication Instructions:  Your physician has recommended you make the following change in your medication:  START: furosemide (Lasix) 20 mg by mouth once daily as needed for swelling, shortness of breath, 2-3 pound weight gain over night or 5 lbs in 1 week  *If you need a refill on your cardiac medications before your next appointment, please call your pharmacy*   Lab Work: NONE If you have labs (blood work) drawn today and your tests are completely normal, you will receive your results only by: [place] (if you have MyChart) OR A paper copy in the mail If you have any lab test that is abnormal or we need to change your treatment, we will call you to review the results.   Testing/Procedures: Your physician has referred you to see an Electrophysiologist.    Follow-Up: At [facility], you and your health needs are our priority.  As part of our continuing mission to provide you with exceptional heart care, we have created designated Provider Care Teams.  These Care Teams include your primary Cardiologist (physician) and Advanced Practice Providers (APPs -  Physician Assistants and Nurse Practitioners) who all work together  to provide you with the care you need, when you need it.  We  recommend signing up for the patient portal called "MyChart".  Sign up information is provided on this After Visit Summary.  MyChart is used to connect with patients for Virtual Visits (Telemedicine).  Patients are able to view lab/test results, encounter notes, upcoming appointments, etc.  Non-urgent messages can be sent to your provider as well.   To learn more about what you can do with MyChart, go to [email or web address].    Your next appointment:   1 year(s)  Provider:   Werner Lean, MD       Signed, Werner Lean, MD  03/09/2022 5:47 PM    [place]

## 2022-03-12 ENCOUNTER — Telehealth: Payer: Self-pay

## 2022-03-12 NOTE — Telephone Encounter (Signed)
The patient is scheduled with Dr. Quentin Ore 04/24/2022.

## 2022-03-12 NOTE — Telephone Encounter (Signed)
Per Dr. Gasper Sells, called patient to arrange Watchman consult with Dr. Burt Knack or Dr. Quentin Ore.  Left message to call back.

## 2022-03-13 DIAGNOSIS — E782 Mixed hyperlipidemia: Secondary | ICD-10-CM | POA: Diagnosis not present

## 2022-03-13 DIAGNOSIS — E039 Hypothyroidism, unspecified: Secondary | ICD-10-CM | POA: Diagnosis not present

## 2022-03-13 DIAGNOSIS — M8580 Other specified disorders of bone density and structure, unspecified site: Secondary | ICD-10-CM | POA: Diagnosis not present

## 2022-03-13 DIAGNOSIS — M810 Age-related osteoporosis without current pathological fracture: Secondary | ICD-10-CM | POA: Diagnosis not present

## 2022-03-13 DIAGNOSIS — F3181 Bipolar II disorder: Secondary | ICD-10-CM | POA: Diagnosis not present

## 2022-03-13 DIAGNOSIS — N183 Chronic kidney disease, stage 3 unspecified: Secondary | ICD-10-CM | POA: Diagnosis not present

## 2022-03-14 ENCOUNTER — Ambulatory Visit (INDEPENDENT_AMBULATORY_CARE_PROVIDER_SITE_OTHER): Payer: Medicare Other | Admitting: Psychology

## 2022-03-14 DIAGNOSIS — F319 Bipolar disorder, unspecified: Secondary | ICD-10-CM | POA: Diagnosis not present

## 2022-03-14 NOTE — Progress Notes (Signed)
   Meredith [name] is a 72 y.o. female patient   Date: 03/14/2022  Treatment Plan: Diagnosis F31.81 (Bipolar II disorder) [n/a]  Symptoms Depressed or irritable mood. (Status: maintained) -- No Description Entered  Lack of energy. (Status: maintained) -- No Description Entered  Social withdrawal. (Status: maintained) -- No Description Entered  Medication Status compliance  Safety none  If Suicidal or Homicidal State Action Taken: unspecified  Current Risk: low Medications Abilify (Dosage: '30mg'$ )  Hydocodone (Dosage: 7.'5mg'$ )  Lamotrigine (Dosage: '200mg'$ /day)  Lithium (Dosage: '450mg'$ /day)  Seroquel (Dosage: '200mg'$ /day)  Objectives Related Problem: Develop healthy cognitive patterns and beliefs about self and the world that lead to alleviation and help prevent the relapse of mood episodes. Description: Verbalize grief, fear, and anger regarding real or imagined losses in life. Target Date: 2022-08-10 Frequency: Daily Modality: individual Progress: 80%  Related Problem: Develop healthy cognitive patterns and beliefs about self and the world that lead to alleviation and help prevent the relapse of mood episodes. Description: Take prescribed medications as directed. Target Date: 2022-02-08 Frequency: Daily Modality: individual Progress: 100%  Related Problem: Develop healthy cognitive patterns and beliefs about self and the world that lead to alleviation and help prevent the relapse of mood episodes. Description: Describe mood state, energy level, amount of control over thoughts, and sleeping pattern. Target Date: 2022-08-10 Frequency: Daily Modality: individual Progress: 75%  Client Response full compliance  Service Location Location, [name] [place], [place] [address]  Service Code cpt [phone] P Neuropsych. testing  Related past to present  Self-monitoring  Self care activities  Emotion regulation skills  Validate/empathize  Facilitate problem solving   Normalize/Reframe  Journaling  Comments  Dx.: F31.81  Goals: States she is seeking emotional stability. She needs guidance in managing some of her physical/medical challenges and limitations. Also, have some interpersonal challenges and seeks feedback on addressing these difficulties.Revised target date is 6-24.  Meds: Seroquel ('300mg'$  daily), Hydrocodone (7.5 mg up to 3x/day), Buspar ('10mg'$  twice daily).  Patient was seen via video. She is at home and I am at home office. Joyclyn says her brother has been sick lately. She saw Dr. Clovis Pu regarding her meds and made no changes. Will not see him for three months. Her friend with Parkinson's Disease got Covid 52 and passed away. She has been in touch with her widow to provide support. Jerrine claims she is feeling reasonably stable, but has been staying home to avoid exposure to sickness. Nothing significant on schedule and says she feels that her depression has been more manageable.           [name]                                                  [name], PhD Time: 1:10p-2:00p 50 minutes

## 2022-03-15 ENCOUNTER — Ambulatory Visit: Payer: Medicare Other | Attending: Cardiovascular Disease | Admitting: *Deleted

## 2022-03-15 DIAGNOSIS — Z5181 Encounter for therapeutic drug level monitoring: Secondary | ICD-10-CM

## 2022-03-15 DIAGNOSIS — I480 Paroxysmal atrial fibrillation: Secondary | ICD-10-CM | POA: Diagnosis not present

## 2022-03-15 LAB — POCT INR: INR: 3.5 — AB (ref 2.0–3.0)

## 2022-03-15 NOTE — Patient Instructions (Signed)
Description   Since you have already taken today's dose then do not take any warfarin tomorrow then start taking Warfarin 1.5 tablets daily except 1 tablet on Mondays. Recheck INR in 2 weeks. Call with any medication changes or if you are scheduled for surgery [facility] [phone] Main [phone]

## 2022-03-26 DIAGNOSIS — M47816 Spondylosis without myelopathy or radiculopathy, lumbar region: Secondary | ICD-10-CM | POA: Diagnosis not present

## 2022-03-26 DIAGNOSIS — M25552 Pain in left hip: Secondary | ICD-10-CM | POA: Diagnosis not present

## 2022-03-26 DIAGNOSIS — G894 Chronic pain syndrome: Secondary | ICD-10-CM | POA: Diagnosis not present

## 2022-03-26 DIAGNOSIS — M1612 Unilateral primary osteoarthritis, left hip: Secondary | ICD-10-CM | POA: Diagnosis not present

## 2022-03-26 DIAGNOSIS — M545 Low back pain, unspecified: Secondary | ICD-10-CM | POA: Diagnosis not present

## 2022-03-26 DIAGNOSIS — Z79891 Long term (current) use of opiate analgesic: Secondary | ICD-10-CM | POA: Diagnosis not present

## 2022-03-27 DIAGNOSIS — G518 Other disorders of facial nerve: Secondary | ICD-10-CM | POA: Diagnosis not present

## 2022-03-27 DIAGNOSIS — M542 Cervicalgia: Secondary | ICD-10-CM | POA: Diagnosis not present

## 2022-03-27 DIAGNOSIS — M791 Myalgia, unspecified site: Secondary | ICD-10-CM | POA: Diagnosis not present

## 2022-03-27 DIAGNOSIS — G43719 Chronic migraine without aura, intractable, without status migrainosus: Secondary | ICD-10-CM | POA: Diagnosis not present

## 2022-03-28 ENCOUNTER — Ambulatory Visit: Payer: Medicare Other | Admitting: Psychology

## 2022-03-30 ENCOUNTER — Ambulatory Visit: Payer: Medicare Other | Attending: Cardiovascular Disease

## 2022-03-30 DIAGNOSIS — Z5181 Encounter for therapeutic drug level monitoring: Secondary | ICD-10-CM | POA: Insufficient documentation

## 2022-03-30 DIAGNOSIS — I480 Paroxysmal atrial fibrillation: Secondary | ICD-10-CM | POA: Diagnosis not present

## 2022-03-30 LAB — POCT INR: INR: 4.5 — AB (ref 2.0–3.0)

## 2022-03-30 NOTE — Patient Instructions (Signed)
HOLD SATURDAY ONLY THEN DECREASE TO 1.5 TABLETS DAILY, EXCEPT 1 TABLET ON MONDAY, [place]. Recheck INR in 2 weeks. Call with any medication changes or if you are scheduled for surgery [facility] [phone] Main [phone]

## 2022-04-02 ENCOUNTER — Ambulatory Visit (INDEPENDENT_AMBULATORY_CARE_PROVIDER_SITE_OTHER): Payer: Medicare Other | Admitting: Psychology

## 2022-04-02 DIAGNOSIS — F319 Bipolar disorder, unspecified: Secondary | ICD-10-CM

## 2022-04-02 NOTE — Progress Notes (Signed)
                  Rayetta Jordanna Hendrie is a 72 y.o. female patient   Date: 04/02/2022  Treatment Plan: Diagnosis F31.81 (Bipolar II disorder) [n/a]  Symptoms Depressed or irritable mood. (Status: maintained) -- No Description Entered  Lack of energy. (Status: maintained) -- No Description Entered  Social withdrawal. (Status: maintained) -- No Description Entered  Medication Status compliance  Safety none  If Suicidal or Homicidal State Action Taken: unspecified  Current Risk: low Medications Abilify (Dosage: '30mg'$ )  Hydocodone (Dosage: 7.'5mg'$ )  Lamotrigine (Dosage: '200mg'$ /day)  Lithium (Dosage: '450mg'$ /day)  Seroquel (Dosage: '200mg'$ /day)  Objectives Related Problem: Develop healthy cognitive patterns and beliefs about self and the world that lead to alleviation and help prevent the relapse of mood episodes. Description: Verbalize grief, fear, and anger regarding real or imagined losses in life. Target Date: 2022-08-10 Frequency: Daily Modality: individual Progress: 80%  Related Problem: Develop healthy cognitive patterns and beliefs about self and the world that lead to alleviation and help prevent the relapse of mood episodes. Description: Take prescribed medications as directed. Target Date: 2022-02-08 Frequency: Daily Modality: individual Progress: 100%  Related Problem: Develop healthy cognitive patterns and beliefs about self and the world that lead to alleviation and help prevent the relapse of mood episodes. Description: Describe mood state, energy level, amount of control over thoughts, and sleeping pattern. Target Date: 2022-08-10 Frequency: Daily Modality: individual Progress: 75%  Client Response full compliance  Service Location Location, [name] [place], [place] [address]  Service Code cpt [phone] P Neuropsych. testing  Related past to present  Self-monitoring  Self care activities  Emotion regulation skills  Validate/empathize   Facilitate problem solving  Normalize/Reframe  Journaling  Comments  Dx.: F31.81  Goals: States she is seeking emotional stability. She needs guidance in managing some of her physical/medical challenges and limitations. Also, have some interpersonal challenges and seeks feedback on addressing these difficulties.Revised target date is 6-24.  Meds: Seroquel ('300mg'$  daily), Hydrocodone (7.5 mg up to 3x/day), Buspar ('10mg'$  twice daily).  Patient was seen via video. She is at home and I am at home office. Roman says that her brother sent her a text and it was very "hurtful". He was critical of her and she responded. He has not responding to her reaction. I encouraged her to be direct and assertive with her brother. He is unlikely to address his feelings with her. He only shares when he is very upset and has an outburst at her. She is very reluctant to bring up her feelings to her brother for fear that he will "blow up".  She discussed her grief related to the passing of her friend Engineer, technical sales. She attended the "celebration of life" for [employer], which has helped her with her grief. We talked about taking time to reflect in an effort to work through her grief. He was "special" to her and she will miss him.            [name]                                                  [name], PhD Time: 8:40a-9:30a 50 minutes

## 2022-04-11 ENCOUNTER — Ambulatory Visit (INDEPENDENT_AMBULATORY_CARE_PROVIDER_SITE_OTHER): Payer: Medicare Other | Admitting: Psychology

## 2022-04-11 DIAGNOSIS — F319 Bipolar disorder, unspecified: Secondary | ICD-10-CM

## 2022-04-11 NOTE — Progress Notes (Signed)
                                 Meredith [name] is a 72 y.o. female patient   Date: 04/11/2022  Treatment Plan: Diagnosis F31.81 (Bipolar II disorder) [n/a]  Symptoms Depressed or irritable mood. (Status: maintained) -- No Description Entered  Lack of energy. (Status: maintained) -- No Description Entered  Social withdrawal. (Status: maintained) -- No Description Entered  Medication Status compliance  Safety none  If Suicidal or Homicidal State Action Taken: unspecified  Current Risk: low Medications Abilify (Dosage: '30mg'$ )  Hydocodone (Dosage: 7.'5mg'$ )  Lamotrigine (Dosage: '200mg'$ /day)  Lithium (Dosage: '450mg'$ /day)  Seroquel (Dosage: '200mg'$ /day)  Objectives Related Problem: Develop healthy cognitive patterns and beliefs about self and the world that lead to alleviation and help prevent the relapse of mood episodes. Description: Verbalize grief, fear, and anger regarding real or imagined losses in life. Target Date: 2022-08-10 Frequency: Daily Modality: individual Progress: 80%  Related Problem: Develop healthy cognitive patterns and beliefs about self and the world that lead to alleviation and help prevent the relapse of mood episodes. Description: Take prescribed medications as directed. Target Date: 2022-02-08 Frequency: Daily Modality: individual Progress: 100%  Related Problem: Develop healthy cognitive patterns and beliefs about self and the world that lead to alleviation and help prevent the relapse of mood episodes. Description: Describe mood state, energy level, amount of control over thoughts, and sleeping pattern. Target Date: 2022-08-10 Frequency: Daily Modality: individual Progress: 75%  Client Response full compliance  Service Location Location, [name] [place], [place] [address]  Service Code cpt [phone] P Neuropsych. testing  Related past to present  Self-monitoring  Self care activities  Emotion regulation  skills  Validate/empathize  Facilitate problem solving  Normalize/Reframe  Journaling  Comments  Dx.: F31.81  Goals: States she is seeking emotional stability. She needs guidance in managing some of her physical/medical challenges and limitations. Also, have some interpersonal challenges and seeks feedback on addressing these difficulties.Revised target date is 6-24.  Meds: Seroquel ('300mg'$  daily), Hydrocodone (7.5 mg up to 3x/day), Buspar ('10mg'$  twice daily).  Patient was seen via video. She is at home and I am at home office. Meredith [name] called her brother for first time in 2 weeks. She asked him if he is angry because he has avoided her. He told her he is not angry but was angry at her for something she said in a text to him. She told him not to ignore her if he is angry and he was receptive. There is more she would like to have said, but didn't feel Meredith [name] would apreciate it or engage in more dialogue.  She talked about some of her stress related to her increasing bills. She has few options on how to mange, and needs to shift her focus away from her worry and recognize she has been successful dealing with her expenses in the past.                  .                                                  [name], PhD Time: 2:10p-3:00p 50 minutes

## 2022-04-12 DIAGNOSIS — M25552 Pain in left hip: Secondary | ICD-10-CM | POA: Diagnosis not present

## 2022-04-13 ENCOUNTER — Ambulatory Visit: Payer: Medicare Other | Attending: Internal Medicine | Admitting: *Deleted

## 2022-04-13 DIAGNOSIS — I480 Paroxysmal atrial fibrillation: Secondary | ICD-10-CM | POA: Diagnosis not present

## 2022-04-13 DIAGNOSIS — Z5181 Encounter for therapeutic drug level monitoring: Secondary | ICD-10-CM | POA: Insufficient documentation

## 2022-04-13 LAB — POCT INR: INR: 2.9 (ref 2.0–3.0)

## 2022-04-13 NOTE — Patient Instructions (Signed)
Description   CONTINUE TAKING WARFARIN 1.5 TABLETS DAILY, EXCEPT 1 TABLET ON MONDAY, [place]. Recheck INR in 3 weeks. Call with any medication changes or if you are scheduled for surgery [facility] [phone] Main [phone]

## 2022-04-18 DIAGNOSIS — Z0000 Encounter for general adult medical examination without abnormal findings: Secondary | ICD-10-CM | POA: Diagnosis not present

## 2022-04-18 DIAGNOSIS — N183 Chronic kidney disease, stage 3 unspecified: Secondary | ICD-10-CM | POA: Diagnosis not present

## 2022-04-18 DIAGNOSIS — D649 Anemia, unspecified: Secondary | ICD-10-CM | POA: Diagnosis not present

## 2022-04-18 DIAGNOSIS — E559 Vitamin D deficiency, unspecified: Secondary | ICD-10-CM | POA: Diagnosis not present

## 2022-04-18 DIAGNOSIS — E039 Hypothyroidism, unspecified: Secondary | ICD-10-CM | POA: Diagnosis not present

## 2022-04-18 DIAGNOSIS — E7800 Pure hypercholesterolemia, unspecified: Secondary | ICD-10-CM | POA: Diagnosis not present

## 2022-04-18 DIAGNOSIS — G43009 Migraine without aura, not intractable, without status migrainosus: Secondary | ICD-10-CM | POA: Diagnosis not present

## 2022-04-18 DIAGNOSIS — M8580 Other specified disorders of bone density and structure, unspecified site: Secondary | ICD-10-CM | POA: Diagnosis not present

## 2022-04-23 DIAGNOSIS — M791 Myalgia, unspecified site: Secondary | ICD-10-CM | POA: Diagnosis not present

## 2022-04-23 DIAGNOSIS — G518 Other disorders of facial nerve: Secondary | ICD-10-CM | POA: Diagnosis not present

## 2022-04-23 DIAGNOSIS — G43719 Chronic migraine without aura, intractable, without status migrainosus: Secondary | ICD-10-CM | POA: Diagnosis not present

## 2022-04-23 DIAGNOSIS — M542 Cervicalgia: Secondary | ICD-10-CM | POA: Diagnosis not present

## 2022-04-24 ENCOUNTER — Encounter: Payer: Self-pay | Admitting: Cardiology

## 2022-04-24 ENCOUNTER — Ambulatory Visit: Payer: Medicare Other | Attending: Cardiology | Admitting: Cardiology

## 2022-04-24 VITALS — BP 138/68 | HR 98 | Ht 58.5 in | Wt 189.4 lb

## 2022-04-24 DIAGNOSIS — I480 Paroxysmal atrial fibrillation: Secondary | ICD-10-CM | POA: Diagnosis not present

## 2022-04-24 DIAGNOSIS — R791 Abnormal coagulation profile: Secondary | ICD-10-CM | POA: Diagnosis not present

## 2022-04-24 NOTE — Progress Notes (Deleted)
  Electrophysiology Office Note:    Date:  04/24/2022   ID:  Salle, Bonifay 06/18/1950, MRN [id number]  [place] Cardiologist:  Werner Lean, MD  [facility] Electrophysiologist:  Vickie Epley, MD   Referring MD: Rudean Haskell A*   Chief Complaint: Atrial fibrillation  History of Present Illness:    Meredith [name] is a 72 y.o. female who I am seeing today for an evaluation of atrial fibrillation at the request of Dr. Gasper Sells.  The patient has had 3 prior ablations for her atrial fibrillation.  Her last ablation was a cryoablation in 2018.  Her medical history includes bipolar disorder on lithium, prior fall.  She is maintained on Coumadin for stroke prophylaxis.  She is having some difficulty maintaining therapeutic INR with recent checks showing supratherapeutic levels.  She uses aspirin to manage pain.  She is referred to discuss possible left atrial appendage occlusion.    Their past medical, social and family history was reveiwed.   ROS:   Please see the history of present illness.    All other systems reviewed and are negative.  EKGs/Labs/Other Studies Reviewed:    The following studies were reviewed today:  August 18, 2020 echo shows EF 60 to 65%, normal RV, mild MR  EKG:  The ekg ordered today demonstrates sinus rhythm   Physical Exam:    VS:  BP 138/68 (BP Location: Right Arm, Patient Position: Sitting, Cuff Size: Large)   Pulse 98   Ht 4' 10.5" (1.486 m)   Wt 189 lb 6.4 oz (85.9 kg)   LMP  (LMP Unknown)   SpO2 98%   BMI 38.91 kg/m     Wt Readings from Last 3 Encounters:  04/24/22 189 lb 6.4 oz (85.9 kg)  03/09/22 187 lb (84.8 kg)  03/03/21 172 lb (78 kg)     GEN:  Well nourished, well developed in no acute distress.  Obese CARDIAC: RRR, no murmurs, rubs, gallops RESPIRATORY:  Clear to auscultation without rales, wheezing or rhonchi       ASSESSMENT AND PLAN:    1. PAF (paroxysmal atrial  fibrillation) ([place])   2. Deviation of international normalized ratio (INR) from target range     #Atrial fibrillation Symptomatic.  3 prior ablations.  Fairly well-controlled.  On Coumadin for an elevated CHA2DS2-VASc.  Has had difficulty maintaining a therapeutic INR.  She takes aspirin periodically for pain and is concerned about the associated bleeding risk while on Coumadin with intermittently supratherapeutic INRs.  We discussed left atrial appendage occlusion as an alternative to anticoagulation for stroke risk mitigation during today's clinic appointment.  Based on her risk factors, she is at low risk of ischemic stroke with her atrial fibrillation. At this time, her CHADSVASc is 2 for age and gender.    CHA2DS2-VASc Score = 2  The patient's score is based upon: CHF History: 0 HTN History: 0 Diabetes History: 0 Stroke History: 0 Vascular Disease History: 0 Age Score: 1 Gender Score: 1  Given her CHA2DS2-VASc of 2, she should continue on anticoagulation rather than pursue left atrial appendage occlusion.  Should her risk factors change in her CHA2DS2-VASc increase to 3 or more, could revisit discussions surrounding left atrial appendage occlusion.  Follow-up with EP on an as-needed basis.  Plan discussed with Dr. Gasper Sells.   Signed, Hilton Cork. Quentin Ore, MD, [facility], [facility] 04/24/2022 5:17 PM    Electrophysiology [facility]

## 2022-04-24 NOTE — Patient Instructions (Signed)
Medication Instructions:  Your physician recommends that you continue on your current medications as directed. Please refer to the Current Medication list given to you today.  *If you need a refill on your cardiac medications before your next appointment, please call your pharmacy*  Follow-Up: At [facility], you and your health needs are our priority.  As part of our continuing mission to provide you with exceptional heart care, we have created designated Provider Care Teams.  These Care Teams include your primary Cardiologist (physician) and Advanced Practice Providers (APPs -  Physician Assistants and Nurse Practitioners) who all work together to provide you with the care you need, when you need it.  Your next appointment:   As needed with Dr. Lambert 

## 2022-04-24 NOTE — Progress Notes (Signed)
Electrophysiology Office Note:    Date:  04/24/2022   ID:  [name], Meredith 12/13/1950, MRN [id number]  [place] Cardiologist:  Werner Lean, MD  [facility] Electrophysiologist:  Vickie Epley, MD   Referring MD: Rudean Haskell A*   Chief Complaint: Atrial fibrillation  History of Present Illness:    Meredith [name] is a 72 y.o. female who I am seeing today for an evaluation of atrial fibrillation at the request of Dr. Gasper Sells.  The patient has had 3 prior ablations for her atrial fibrillation.  Her last ablation was a cryoablation in 2018.  Her medical history includes bipolar disorder on lithium, prior fall.  She is maintained on Coumadin for stroke prophylaxis.  She is having some difficulty maintaining therapeutic INR with recent checks showing supratherapeutic levels.  She uses aspirin to manage pain.  She is referred to discuss possible left atrial appendage occlusion.  Today, she appears well.  She remains compliant with Coumadin. Last week her INR was 2.9. Previously it was high for 3 consecutive checks. She states her INR is monitored at our [place] office.  In clinic her BP was initially elevated at 184/76. She confirms this is atypical for her. Prior to her appointment she walked from the end of the parking lot and was out of breath. On manual recheck, her blood pressure improved to 138/68.  On Thursday she will see her PCP for her annual physical. She confirms that she has been prediabetic in the past but not diagnosed with diabetes.  She denies any palpitations, chest pain, or peripheral edema. No lightheadedness, headaches, syncope, orthopnea, or PND.     Their past medical, social and family history was reviewed.   ROS:   Please see the history of present illness.    All other systems reviewed and are negative.  EKGs/Labs/Other Studies Reviewed:    The following studies were reviewed today:  August 18, 2020 echo  shows EF 60 to 65%, normal RV, mild MR  EKG:  The ekg ordered 03/09/2022 was personally reviewed and demonstrates sinus rhythm.   Physical Exam:    VS:  BP 138/68 (BP Location: Right Arm, Patient Position: Sitting, Cuff Size: Large)   Pulse 98   Ht 4' 10.5" (1.486 m)   Wt 189 lb 6.4 oz (85.9 kg)   LMP  (LMP Unknown)   SpO2 98%   BMI 38.91 kg/m     Wt Readings from Last 3 Encounters:  04/24/22 189 lb 6.4 oz (85.9 kg)  03/09/22 187 lb (84.8 kg)  03/03/21 172 lb (78 kg)     GEN: Well nourished, well developed in no acute distress.  Obese CARDIAC: RRR, no murmurs, rubs, gallops RESPIRATORY:  Clear to auscultation without rales, wheezing or rhonchi       ASSESSMENT AND PLAN:    1. PAF (paroxysmal atrial fibrillation) ([place])   2. Deviation of international normalized ratio (INR) from target range     #Atrial fibrillation Symptomatic.  3 prior ablations.  Fairly well-controlled.  On Coumadin for an elevated CHA2DS2-VASc.  Has had difficulty maintaining a therapeutic INR.  She takes aspiring periodically for pain and is concerned about the associated bleeding risk while on Coumadin with intermittently supratherapeutic INRs.  We discussed left atrial appendage occlusion as an alternative to anticoagulation for stroke risk mitigation during today's clinic appointment.  Based on her risk factors, she is at very low risk of ischemic stroke with her atrial fibrillation. At this time, her CHADSVASc  is 2 for age and gender.    CHA2DS2-VASc Score = 2  The patient's score is based upon: CHF History: 0 HTN History: 0 Diabetes History: 0 Stroke History: 0 Vascular Disease History: 0 Age Score: 1 Gender Score: 1   Given her CHA2DS2-VASc of 2, she should continue on anticoagulation rather than pursue left atrial appendage occlusion.  Should her risk factors change in her CHA2DS2-VASc increase to 3 or more, could revisit discussions surrounding left atrial appendage occlusion.   Follow-up  with EP on an as-needed basis.   Plan discussed with Dr. Gasper Sells..    [facility] Stumpf,acting as a scribe for Vickie Epley, MD.,have documented all relevant documentation on the behalf of Vickie Epley, MD,as directed by  Vickie Epley, MD while in the presence of Vickie Epley, MD.  I, Vickie Epley, MD, have reviewed all documentation for this visit. The documentation on 04/24/22 for the exam, diagnosis, procedures, and orders are all accurate and complete.   Signed, Hilton Cork. Quentin Ore, MD, [name], [facility] 04/24/2022 3:30 PM    Electrophysiology [facility]

## 2022-04-25 ENCOUNTER — Ambulatory Visit (INDEPENDENT_AMBULATORY_CARE_PROVIDER_SITE_OTHER): Payer: Medicare Other | Admitting: Psychology

## 2022-04-25 DIAGNOSIS — Z79891 Long term (current) use of opiate analgesic: Secondary | ICD-10-CM | POA: Diagnosis not present

## 2022-04-25 DIAGNOSIS — M1612 Unilateral primary osteoarthritis, left hip: Secondary | ICD-10-CM | POA: Diagnosis not present

## 2022-04-25 DIAGNOSIS — F319 Bipolar disorder, unspecified: Secondary | ICD-10-CM | POA: Diagnosis not present

## 2022-04-25 DIAGNOSIS — G894 Chronic pain syndrome: Secondary | ICD-10-CM | POA: Diagnosis not present

## 2022-04-25 DIAGNOSIS — M25552 Pain in left hip: Secondary | ICD-10-CM | POA: Diagnosis not present

## 2022-04-25 DIAGNOSIS — M25551 Pain in right hip: Secondary | ICD-10-CM | POA: Diagnosis not present

## 2022-04-25 DIAGNOSIS — M47816 Spondylosis without myelopathy or radiculopathy, lumbar region: Secondary | ICD-10-CM | POA: Diagnosis not present

## 2022-04-25 DIAGNOSIS — M545 Low back pain, unspecified: Secondary | ICD-10-CM | POA: Diagnosis not present

## 2022-04-25 NOTE — Progress Notes (Signed)
                                                Galaxy Claryssa Hilde is a 72 y.o. female patient   Date: 04/25/2022  Treatment Plan: Diagnosis F31.81 (Bipolar II disorder) [n/a]  Symptoms Depressed or irritable mood. (Status: maintained) -- No Description Entered  Lack of energy. (Status: maintained) -- No Description Entered  Social withdrawal. (Status: maintained) -- No Description Entered  Medication Status compliance  Safety none  If Suicidal or Homicidal State Action Taken: unspecified  Current Risk: low Medications Abilify (Dosage: 80m)  Hydocodone (Dosage: 7.526m  Lamotrigine (Dosage: 20040may)  Lithium (Dosage: 450m6my)  Seroquel (Dosage: 200mg52m)  Objectives Related Problem: Develop healthy cognitive patterns and beliefs about self and the world that lead to alleviation and help prevent the relapse of mood episodes. Description: Verbalize grief, fear, and anger regarding real or imagined losses in life. Target Date: 2022-08-10 Frequency: Daily Modality: individual Progress: 80%  Related Problem: Develop healthy cognitive patterns and beliefs about self and the world that lead to alleviation and help prevent the relapse of mood episodes. Description: Take prescribed medications as directed. Target Date: 2022-02-08 Frequency: Daily Modality: individual Progress: 100%  Related Problem: Develop healthy cognitive patterns and beliefs about self and the world that lead to alleviation and help prevent the relapse of mood episodes. Description: Describe mood state, energy level, amount of control over thoughts, and sleeping pattern. Target Date: 2022-08-10 Frequency: Daily Modality: individual Progress: 75%  Client Response full compliance  Service Location Location[address] [id number] Code cpt [phone]. testing  Related past to present  Self-monitoring  Self care  activities  Emotion regulation skills  Validate/empathize  Facilitate problem solving  Normalize/Reframe  Journaling  Comments  Dx.: F31.81  Goals: States she is seeking emotional stability. She needs guidance in managing some of her physical/medical challenges and limitations. Also, have some interpersonal challenges and seeks feedback on addressing these difficulties.Revised target date is 6-24.  Meds: Seroquel (300mg 56my), Hydrocodone (7.5 mg up to 3x/day), Buspar (10mg t69m daily).  Patient was seen via video. She is at home and I am at home office. Makila Johnsiethat she has been going to doctor's appointments to address her various medical conditions. She talked about her friend who lost her husband and is having significant grief issues. Jayliana Austynng all she can to help, but feels it is not making an impact. She continually is struggling to manage her pain, and says it is under reasonable control. Her moods are relatively stable with no reports of significant depression.                   .      [name]                                           GUTTERMMarcelina Morelime: 1:10p-2:00p 50 minutes

## 2022-04-26 DIAGNOSIS — E039 Hypothyroidism, unspecified: Secondary | ICD-10-CM | POA: Diagnosis not present

## 2022-04-26 DIAGNOSIS — K219 Gastro-esophageal reflux disease without esophagitis: Secondary | ICD-10-CM | POA: Diagnosis not present

## 2022-04-26 DIAGNOSIS — Z0000 Encounter for general adult medical examination without abnormal findings: Secondary | ICD-10-CM | POA: Diagnosis not present

## 2022-04-26 DIAGNOSIS — M8580 Other specified disorders of bone density and structure, unspecified site: Secondary | ICD-10-CM | POA: Diagnosis not present

## 2022-04-26 DIAGNOSIS — I480 Paroxysmal atrial fibrillation: Secondary | ICD-10-CM | POA: Diagnosis not present

## 2022-04-26 DIAGNOSIS — E7800 Pure hypercholesterolemia, unspecified: Secondary | ICD-10-CM | POA: Diagnosis not present

## 2022-04-26 DIAGNOSIS — F5101 Primary insomnia: Secondary | ICD-10-CM | POA: Diagnosis not present

## 2022-04-26 DIAGNOSIS — F3181 Bipolar II disorder: Secondary | ICD-10-CM | POA: Diagnosis not present

## 2022-05-03 ENCOUNTER — Ambulatory Visit
Admission: RE | Admit: 2022-05-03 | Discharge: 2022-05-03 | Disposition: A | Discharge status: Home / Self Care | Payer: Medicare Other | Source: Ambulatory Visit | Attending: Nurse Practitioner | Admitting: Nurse Practitioner

## 2022-05-03 ENCOUNTER — Other Ambulatory Visit: Payer: Self-pay | Admitting: Nurse Practitioner

## 2022-05-03 ENCOUNTER — Other Ambulatory Visit: Payer: Self-pay | Admitting: Internal Medicine

## 2022-05-03 DIAGNOSIS — M25551 Pain in right hip: Secondary | ICD-10-CM

## 2022-05-04 ENCOUNTER — Ambulatory Visit: Payer: Medicare Other | Attending: Cardiovascular Disease

## 2022-05-04 DIAGNOSIS — I480 Paroxysmal atrial fibrillation: Secondary | ICD-10-CM | POA: Insufficient documentation

## 2022-05-04 DIAGNOSIS — Z5181 Encounter for therapeutic drug level monitoring: Secondary | ICD-10-CM | POA: Diagnosis not present

## 2022-05-04 LAB — POCT INR: INR: 4.1 — AB (ref 2.0–3.0)

## 2022-05-04 NOTE — Patient Instructions (Signed)
Description   Skip tomorrow's dosage of Warfarin, then start taking 1 tablet daily except 1.5 tablets on Tuesdays, Thursdays and Sundays.  Recheck INR in 2 weeks. Call with any medication changes or if you are scheduled for surgery [facility] [phone] Main [phone]

## 2022-05-09 ENCOUNTER — Ambulatory Visit (INDEPENDENT_AMBULATORY_CARE_PROVIDER_SITE_OTHER): Payer: Medicare Other | Admitting: Psychology

## 2022-05-09 DIAGNOSIS — M5451 Vertebrogenic low back pain: Secondary | ICD-10-CM | POA: Diagnosis not present

## 2022-05-09 DIAGNOSIS — F319 Bipolar disorder, unspecified: Secondary | ICD-10-CM | POA: Diagnosis not present

## 2022-05-09 NOTE — Progress Notes (Signed)
   Meredith [name] is a 72 y.o. female patient   Date: 05/09/2022  Treatment Plan: Diagnosis F31.81 (Bipolar II disorder) [n/a]  Symptoms Depressed or irritable mood. (Status: maintained) -- No Description Entered  Lack of energy. (Status: maintained) -- No Description Entered  Social withdrawal. (Status: maintained) -- No Description Entered  Medication Status compliance  Safety none  If Suicidal or Homicidal State Action Taken: unspecified  Current Risk: low Medications Abilify (Dosage: 30mg )  Hydocodone (Dosage: 7.5mg )  Lamotrigine (Dosage: 200mg /day)  Lithium (Dosage: 450mg /day)  Seroquel (Dosage: 200mg /day)  Objectives Related Problem: Develop healthy cognitive patterns and beliefs about self and the world that lead to alleviation and help prevent the relapse of mood episodes. Description: Verbalize grief, fear, and anger regarding real or imagined losses in life. Target Date: 2022-08-10 Frequency: Daily Modality: individual Progress: 80%  Related Problem: Develop healthy cognitive patterns and beliefs about self and the world that lead to alleviation and help prevent the relapse of mood episodes. Description: Take prescribed medications as directed. Target Date: 2022-02-08 Frequency: Daily Modality: individual Progress: 100%  Related Problem: Develop healthy cognitive patterns and beliefs about self and the world that lead to alleviation and help prevent the relapse of mood episodes. Description: Describe mood state, energy level, amount of control over thoughts, and sleeping pattern. Target Date: 2022-08-10 Frequency: Daily Modality: individual Progress: 75%  Client Response full compliance  Service Location Location, [name] [place], [place] [address]  Service Code cpt [phone] P Neuropsych. testing  Related past to present  Self-monitoring  Self care activities  Emotion regulation skills  Validate/empathize  Facilitate problem solving   Normalize/Reframe  Journaling  Comments  Dx.: F31.81  Goals: States she is seeking emotional stability. She needs guidance in managing some of her physical/medical challenges and limitations. Also, have some interpersonal challenges and seeks feedback on addressing these difficulties.Revised target date is 6-24.  Meds: Seroquel (300mg  daily), Hydrocodone (7.5 mg up to 3x/day), Buspar (10mg  twice daily).  Patient was seen via video. She is at home and I am at home office. Meredith [name] says she is "not doing too well". She has an upset stomach and a migraine (which she claims to have every day). She is having to go to the doctor for her other conditions. We talked about improving self care and pacing herself. Meredith [name] says she and Meredith [name] are doing better and have resumed their dinner dates.                          [name]                                                  [name], PhD Time: 2:15p-3:00p 45 minutes

## 2022-05-11 ENCOUNTER — Other Ambulatory Visit: Payer: Self-pay | Admitting: Internal Medicine

## 2022-05-16 DIAGNOSIS — M5451 Vertebrogenic low back pain: Secondary | ICD-10-CM | POA: Diagnosis not present

## 2022-05-18 ENCOUNTER — Ambulatory Visit: Payer: Medicare Other | Attending: Cardiology | Admitting: Pharmacist

## 2022-05-18 DIAGNOSIS — I480 Paroxysmal atrial fibrillation: Secondary | ICD-10-CM | POA: Diagnosis not present

## 2022-05-18 DIAGNOSIS — Z5181 Encounter for therapeutic drug level monitoring: Secondary | ICD-10-CM | POA: Diagnosis not present

## 2022-05-18 LAB — POCT INR: INR: 3.2 — AB (ref 2.0–3.0)

## 2022-05-18 NOTE — Patient Instructions (Signed)
Description   Skip tomorrow's dosage of Warfarin, then start taking 1 tablet daily except 1.5 tablets on Tuesday and Thursday.  Recheck INR in 2 weeks. Call with any medication changes or if you are scheduled for surgery [facility] [phone] Main [phone]

## 2022-05-21 DIAGNOSIS — M791 Myalgia, unspecified site: Secondary | ICD-10-CM | POA: Diagnosis not present

## 2022-05-21 DIAGNOSIS — G518 Other disorders of facial nerve: Secondary | ICD-10-CM | POA: Diagnosis not present

## 2022-05-21 DIAGNOSIS — M542 Cervicalgia: Secondary | ICD-10-CM | POA: Diagnosis not present

## 2022-05-21 DIAGNOSIS — G43719 Chronic migraine without aura, intractable, without status migrainosus: Secondary | ICD-10-CM | POA: Diagnosis not present

## 2022-05-22 DIAGNOSIS — M5451 Vertebrogenic low back pain: Secondary | ICD-10-CM | POA: Diagnosis not present

## 2022-05-23 ENCOUNTER — Ambulatory Visit (INDEPENDENT_AMBULATORY_CARE_PROVIDER_SITE_OTHER): Payer: Medicare Other | Admitting: Psychology

## 2022-05-23 DIAGNOSIS — F319 Bipolar disorder, unspecified: Secondary | ICD-10-CM

## 2022-05-23 NOTE — Progress Notes (Signed)
                  Meredith [name] is a 72 y.o. female patient   Date: 05/23/2022  Treatment Plan: Diagnosis F31.81 (Bipolar II disorder) [n/a]  Symptoms Depressed or irritable mood. (Status: maintained) -- No Description Entered  Lack of energy. (Status: maintained) -- No Description Entered  Social withdrawal. (Status: maintained) -- No Description Entered  Medication Status compliance  Safety none  If Suicidal or Homicidal State Action Taken: unspecified  Current Risk: low Medications Abilify (Dosage: 30mg )  Hydocodone (Dosage: 7.5mg )  Lamotrigine (Dosage: 200mg /day)  Lithium (Dosage: 450mg /day)  Seroquel (Dosage: 200mg /day)  Objectives Related Problem: Develop healthy cognitive patterns and beliefs about self and the world that lead to alleviation and help prevent the relapse of mood episodes. Description: Verbalize grief, fear, and anger regarding real or imagined losses in life. Target Date: 2022-08-10 Frequency: Daily Modality: individual Progress: 80%  Related Problem: Develop healthy cognitive patterns and beliefs about self and the world that lead to alleviation and help prevent the relapse of mood episodes. Description: Take prescribed medications as directed. Target Date: 2022-02-08 Frequency: Daily Modality: individual Progress: 100%  Related Problem: Develop healthy cognitive patterns and beliefs about self and the world that lead to alleviation and help prevent the relapse of mood episodes. Description: Describe mood state, energy level, amount of control over thoughts, and sleeping pattern. Target Date: 2022-08-10 Frequency: Daily Modality: individual Progress: 75%  Client Response full compliance  Service Location Location, [name] [place], [place] [address]  Service Code cpt [phone] P Neuropsych. testing  Related past to present  Self-monitoring  Self care activities  Emotion regulation skills  Validate/empathize   Facilitate problem solving  Normalize/Reframe  Journaling  Comments  Dx.: F31.81  Goals: States she is seeking emotional stability. She needs guidance in managing some of her physical/medical challenges and limitations. Also, have some interpersonal challenges and seeks feedback on addressing these difficulties.Revised target date is 6-24.  Meds: Seroquel (300mg  daily), Hydrocodone (7.5 mg up to 3x/day), Buspar (10mg  twice daily).  Patient was seen via video. She is at home and I am at home office. Meredith [name] says she is hurting more now that she is in physical therapy than she did before therapy. She states she has otherwise been feeling well. Moods have been stable except for frustration at her medical (physical) condition. Has appointment with Dr. Clovis Pu April 9th. and does not anticipate and changes. States she is getting along well with brother and they are determining how to spend their Easter holiday.                              [name]                                                  [name], PhD Time: 2:15p-3:00p 45 minutes

## 2022-05-25 DIAGNOSIS — Z23 Encounter for immunization: Secondary | ICD-10-CM | POA: Diagnosis not present

## 2022-05-28 ENCOUNTER — Emergency Department (HOSPITAL_BASED_OUTPATIENT_CLINIC_OR_DEPARTMENT_OTHER)
Admission: EM | Admit: 2022-05-28 | Discharge: 2022-05-28 | Disposition: A | Discharge status: Home / Self Care | Payer: Medicare Other | Attending: Emergency Medicine | Admitting: Emergency Medicine

## 2022-05-28 ENCOUNTER — Emergency Department (HOSPITAL_BASED_OUTPATIENT_CLINIC_OR_DEPARTMENT_OTHER): Payer: Medicare Other | Admitting: Radiology

## 2022-05-28 ENCOUNTER — Other Ambulatory Visit: Payer: Self-pay

## 2022-05-28 DIAGNOSIS — Z79899 Other long term (current) drug therapy: Secondary | ICD-10-CM | POA: Diagnosis not present

## 2022-05-28 DIAGNOSIS — X58XXXA Exposure to other specified factors, initial encounter: Secondary | ICD-10-CM | POA: Insufficient documentation

## 2022-05-28 DIAGNOSIS — M25552 Pain in left hip: Secondary | ICD-10-CM | POA: Diagnosis not present

## 2022-05-28 DIAGNOSIS — M7062 Trochanteric bursitis, left hip: Secondary | ICD-10-CM | POA: Insufficient documentation

## 2022-05-28 DIAGNOSIS — N189 Chronic kidney disease, unspecified: Secondary | ICD-10-CM | POA: Insufficient documentation

## 2022-05-28 DIAGNOSIS — M7072 Other bursitis of hip, left hip: Secondary | ICD-10-CM | POA: Diagnosis not present

## 2022-05-28 DIAGNOSIS — Z7901 Long term (current) use of anticoagulants: Secondary | ICD-10-CM | POA: Diagnosis not present

## 2022-05-28 DIAGNOSIS — Y9389 Activity, other specified: Secondary | ICD-10-CM | POA: Diagnosis not present

## 2022-05-28 DIAGNOSIS — I4891 Unspecified atrial fibrillation: Secondary | ICD-10-CM | POA: Insufficient documentation

## 2022-05-28 DIAGNOSIS — E039 Hypothyroidism, unspecified: Secondary | ICD-10-CM | POA: Insufficient documentation

## 2022-05-28 MED ORDER — PREDNISONE 50 MG PO TABS
60.0000 mg | ORAL_TABLET | Freq: Once | ORAL | Status: AC
  Administered 2022-05-28: 60 mg via ORAL
  Filled 2022-05-28: qty 1

## 2022-05-28 MED ORDER — PREDNISONE 20 MG PO TABS: ORAL_TABLET | ORAL | 0 refills | Status: AC

## 2022-05-28 NOTE — ED Triage Notes (Signed)
Pt POV from home, ambulatory, caox4 c/o L hip x1.5 wks. Pt denies any trauma or injury correlating with pain. Pt further reports approx 1 mo ago she had an injection in the hip for pain but that this pain is "different" than the pain before she had the injection.   Norco last taken at 0700 this morning.

## 2022-05-28 NOTE — ED Provider Notes (Signed)
[facility] Provider Note   CSN: [id number] Arrival date & time: 05/28/22  [id number]     History  Chief Complaint  Patient presents with   Hip Pain    Meredith [name] is a 72 y.o. female with past medical history significant for A-fib on chronic anticoagulation, CKD, GERD, chronic pain, osteoporosis, bipolar 2, hypothyroidism presents to the ED complaining of left-sided hip pain for the past 10 days.  Patient states that she was seen for this hip approximately 1 month ago, had imaging done and fluid drawn off.  She also had an injection done for pain.  She reports that the pain now is sharp and is different from the pain she experienced before the injection.  Patient reports that she can feel a "lump" on her hip where it is tender.  She does take Norco for chronic pain with her last dose at 7 AM today.  She denies any trauma, falls, or other injury.  She has been able to ambulate but reports increased pain with ambulation.  Denies numbness, tingling, or weakness in her left lower extremity.       Home Medications Prior to Admission medications   Medication Sig Start Date End Date Taking? Authorizing Provider  alendronate (FOSAMAX) 35 MG tablet Take 35 mg by mouth once a week. 02/02/22   [provider]  Ascorbic Acid (VITAMIN C) 1000 MG tablet Take 1,000 mg by mouth daily.    [provider]  aspirin 325 MG tablet Take 325 mg by mouth daily as needed for headache.     [provider]  baclofen (LIORESAL) 10 MG tablet Take 1 tablet by mouth as needed.    [provider]  bisacodyl (DULCOLAX) 5 MG EC tablet PRN 03/18/20   [provider]  busPIRone (BUSPAR) 15 MG tablet Take 1 tablet (15 mg total) by mouth 2 (two) times daily. 03/06/22   Cottle, Billey Co., MD  Cholecalciferol (VITAMIN D3) 125 MCG (5000 UT) TABS Take 1 tablet by mouth daily at 12 noon.    [provider]  clotrimazole (LOTRIMIN)  1 % cream as needed (skin irritation). 08/03/19   [provider]  docusate sodium (COLACE) 100 MG capsule Take 100 mg by mouth 2 (two) times daily.    [provider]  EPINEPHrine 0.3 mg/0.3 mL IJ SOAJ injection  07/07/19   [provider]  estradiol (ESTRACE) 1 MG tablet Take 1 mg by mouth daily. 01/27/19   [provider]  ferrous sulfate 325 (65 FE) MG tablet Take 325 mg by mouth 2 (two) times daily.    [provider]  furosemide (LASIX) 20 MG tablet Take 1 tablet (20 mg total) by mouth daily as needed. 03/09/22   Chandrasekhar, Mahesh A, MD  Galcanezumab-gnlm (EMGALITY) 120 MG/ML SOAJ Inject into the skin every 30 (thirty) days.    [provider]  HYDROcodone-acetaminophen (NORCO) 7.5-325 MG tablet Take 1 tablet by mouth 3 (three) times daily as needed for moderate pain or severe pain.    [provider]  ipratropium (ATROVENT) 0.03 % nasal spray 1-2 sprays in each nostril 03/08/20   [provider]  levocetirizine (XYZAL) 5 MG tablet Take 5 mg by mouth every evening. 07/07/19   [provider]  levothyroxine (SYNTHROID) 112 MCG tablet Take 112 mcg by mouth daily before breakfast.    [provider]  loperamide (IMODIUM) 2 MG capsule Take 1 capsule (2 mg total) by  mouth as needed for diarrhea or loose stools. 07/20/19   Nita Sells, MD  Melatonin 5 MG TABS Take 20 mg by mouth at bedtime.    [provider]  metoprolol succinate (TOPROL-XL) 50 MG 24 hr tablet TAKE 1 TABLET BY MOUTH ONCE DAILY WITH  OR  IMMEDIATELY  FOLLOWING  A  MEAL 05/11/22   Chandrasekhar, Mahesh A, MD  Multiple Vitamin (MULTIVITAMIN WITH MINERALS) TABS tablet Take 1 tablet by mouth daily. 08/24/20   Cristal Ford, DO  naloxone [facility]) 0.4 MG/ML injection  05/05/19   [provider]  ondansetron (ZOFRAN) 4 MG tablet Take 1 tablet (4 mg total) by mouth every 8 (eight) hours as needed for nausea or vomiting. 11/11/19    Evelina Bucy, DPM  pantoprazole (PROTONIX) 40 MG tablet Take 1 tablet (40 mg total) by mouth daily. 07/07/18   [place], Betty G, MD  pravastatin (PRAVACHOL) 20 MG tablet Take 20 mg by mouth daily. 10/22/17   [provider]  pregabalin (LYRICA) 150 MG capsule Take 150 mg by mouth 3 (three) times daily.    [provider]  QUEtiapine (SEROQUEL) 300 MG tablet Take 1 tablet (300 mg total) by mouth at bedtime. 03/06/22   Cottle, Billey Co., MD  traZODone (DESYREL) 50 MG tablet Take 1 tablet (50 mg total) by mouth at bedtime as needed for sleep. 03/06/22   Cottle, Billey Co., MD  verapamil (VERELAN PM) 120 MG 24 hr capsule Take 1 capsule (120 mg total) by mouth at bedtime. Hold for low blood pressures 08/23/20   Mikhail, [place], DO  vitamin B-12 1000 MCG tablet Take 1 tablet (1,000 mcg total) by mouth daily. 08/24/20   Cristal Ford, DO  warfarin (COUMADIN) 2.5 MG tablet TAKE 1 TO 1 & 1/2 (ONE TO ONE & ONE-HALF) TABLETS BY MOUTH ONCE DAILY OR  AS  DIRECTED  BY  COUMADIN  CLINIC 02/16/22   [name], Wonda Cheng, MD      Allergies    Codeine, Adhesive [tape], Celebrex [celecoxib], Erythromycin, Other, Penicillamine, Sulfa antibiotics, Tricyclic antidepressants, Amoxicillin, Ampicillin, Cephalexin, Macrodantin [nitrofurantoin], and Penicillins    Review of Systems   Review of Systems  Musculoskeletal:  Positive for arthralgias (left hip).  Neurological:  Negative for weakness and numbness.    Physical Exam Updated Vital Signs BP (!) 147/75 (BP Location: Left Arm)   Pulse 81   Temp 98 F (36.7 C) (Oral)   Resp 16   Ht 4' 10.5" (1.486 m)   Wt 85.7 kg   LMP  (LMP Unknown)   SpO2 97%   BMI 38.83 kg/m  Physical Exam Vitals and nursing note reviewed.  Constitutional:      General: She is not in acute distress.    Appearance: Normal appearance. She is not ill-appearing or diaphoretic.  Cardiovascular:     Rate and Rhythm: Normal rate and regular rhythm.     Pulses:           Dorsalis pedis pulses are 2+ on the right side and 2+ on the left side.  Pulmonary:     Effort: Pulmonary effort is normal.  Musculoskeletal:     Left hip: Tenderness present. No bony tenderness or crepitus. Normal strength.     Right lower leg: No edema.     Left lower leg: No edema.       Legs:  Neurological:     Mental Status: She is alert. Mental status is at baseline.     Comments:  5/5 strength in bilateral lower extremities with normal sensation  Psychiatric:        Mood and Affect: Mood normal.        Behavior: Behavior normal.     ED Results / Procedures / Treatments   Labs (all labs ordered are listed, but only abnormal results are displayed) Labs Reviewed - No data to display  EKG None  Radiology DG Hip [place] or [place] Pelvis 1 View Left  Result Date: 05/28/2022 CLINICAL DATA:  Left hip pain without injury EXAM: DG HIP (WITH OR WITHOUT PELVIS) 1V PORT LEFT COMPARISON:  None Available. FINDINGS: Mild spurring at the left hip with superior joint space narrowing. Mild spurring at the left greater trochanter. L4-5 degenerative endplate and facet spurring. No evidence of fracture or bone lesion. IMPRESSION: Asymmetric left hip osteoarthritis.  No acute finding. Electronically Signed   By: Tiburcio Pea M.D.   On: 05/28/2022 10:59    Procedures Procedures    Medications Ordered in ED Medications - No data to display  ED Course/ Medical Decision Making/ A&P                             Medical Decision Making Amount and/or Complexity of Data Reviewed Radiology: ordered.   This patient presents to the ED with chief complaint(s) of left hip pain with pertinent past medical history of osteoporosis, arthritis, chronic pain under management.  The complaint involves an extensive differential diagnosis and also carries with it a high risk of complications and morbidity.    The differential diagnosis includes osteoarthritis flare, trochanteric bursitis, hip  bursitis  The initial plan is to obtain x-ray of left hip with pelvis  Initial Assessment:   Exam significant for pain with palpation over the left greater trochanter with a small palpable mass.  Patient has 5/5 strength in bilateral lower extremities with normal sensation.  She has normal ROM of the left lower extremity.  Patient able to ambulate with steady gait.   Independent visualization and interpretation of imaging: I independently visualized the following imaging with scope of interpretation limited to determining acute life threatening conditions related to emergency care: left hip x-ray, which revealed asymmetric left hip osteoarthritis without acute abnormality.  I agree with radiologist interpretation.    Treatment and Reassessment: Patient given first dose of steroids to treat what I suspect to be trochanteric bursitis.  Will send patient home with prescription for steroids and outpatient follow-up recommended.   Disposition:   I believe that patient is appropriate for discharge home with outpatient follow-up.   The patient has been appropriately medically screened and/or stabilized in the ED. I have low suspicion for any other emergent medical condition which would require further screening, evaluation or treatment in the ED or require inpatient management. At time of discharge the patient is hemodynamically stable and in no acute distress. I have discussed work-up results and diagnosis with patient and answered all questions. Patient is agreeable with discharge plan. We discussed strict return precautions for returning to the emergency department and they verbalized understanding.            Final Clinical Impression(s) / ED Diagnoses Final diagnoses:  Trochanteric bursitis of left hip    Rx / DC Orders ED Discharge Orders     None         Lenard Simmer, PA-C 05/28/22 1113    Vanetta Mulders, MD 06/01/22 1621

## 2022-05-28 NOTE — Discharge Instructions (Addendum)
Thank you for allowing me to be a part of your care today.  Your hip x-ray confirms your left hip osteoarthritis.  I recommend following up with orthopedics or the spine and pain center for this.   I am sending you home on a short course of steroids to treat the inflammation in your hip.  I have also provided information about bursitis.  You may continue to use ice or heat on this area to treat discomfort.  Take your pain medications as prescribed if needed.    Return to the ED if you experience worsening of your symptoms including inability to walk, numbness or tingling in your leg, loss of bladder or bowel control, or if you have any new concerns.

## 2022-05-28 NOTE — ED Notes (Signed)
Discharge instructions, medications and follow up care reviewed and explained. Pt verbalized understanding and had no further questions.   

## 2022-05-29 DIAGNOSIS — M47816 Spondylosis without myelopathy or radiculopathy, lumbar region: Secondary | ICD-10-CM | POA: Diagnosis not present

## 2022-05-29 DIAGNOSIS — F323 Major depressive disorder, single episode, severe with psychotic features: Secondary | ICD-10-CM | POA: Diagnosis not present

## 2022-05-29 DIAGNOSIS — M1612 Unilateral primary osteoarthritis, left hip: Secondary | ICD-10-CM | POA: Diagnosis not present

## 2022-05-29 DIAGNOSIS — M810 Age-related osteoporosis without current pathological fracture: Secondary | ICD-10-CM | POA: Diagnosis not present

## 2022-05-29 DIAGNOSIS — M25552 Pain in left hip: Secondary | ICD-10-CM | POA: Diagnosis not present

## 2022-05-29 DIAGNOSIS — Z79891 Long term (current) use of opiate analgesic: Secondary | ICD-10-CM | POA: Diagnosis not present

## 2022-05-29 DIAGNOSIS — M545 Low back pain, unspecified: Secondary | ICD-10-CM | POA: Diagnosis not present

## 2022-05-29 DIAGNOSIS — M8580 Other specified disorders of bone density and structure, unspecified site: Secondary | ICD-10-CM | POA: Diagnosis not present

## 2022-05-29 DIAGNOSIS — G894 Chronic pain syndrome: Secondary | ICD-10-CM | POA: Diagnosis not present

## 2022-05-29 DIAGNOSIS — E039 Hypothyroidism, unspecified: Secondary | ICD-10-CM | POA: Diagnosis not present

## 2022-05-29 DIAGNOSIS — E782 Mixed hyperlipidemia: Secondary | ICD-10-CM | POA: Diagnosis not present

## 2022-05-29 DIAGNOSIS — M25551 Pain in right hip: Secondary | ICD-10-CM | POA: Diagnosis not present

## 2022-05-29 DIAGNOSIS — N183 Chronic kidney disease, stage 3 unspecified: Secondary | ICD-10-CM | POA: Diagnosis not present

## 2022-06-01 ENCOUNTER — Ambulatory Visit: Payer: Medicare Other | Attending: Cardiovascular Disease | Admitting: Pharmacist

## 2022-06-01 ENCOUNTER — Telehealth: Payer: Self-pay

## 2022-06-01 DIAGNOSIS — Z5181 Encounter for therapeutic drug level monitoring: Secondary | ICD-10-CM | POA: Diagnosis not present

## 2022-06-01 DIAGNOSIS — I480 Paroxysmal atrial fibrillation: Secondary | ICD-10-CM | POA: Diagnosis not present

## 2022-06-01 DIAGNOSIS — M5451 Vertebrogenic low back pain: Secondary | ICD-10-CM | POA: Diagnosis not present

## 2022-06-01 LAB — POCT INR: INR: 3.2 — AB (ref 2.0–3.0)

## 2022-06-01 NOTE — Telephone Encounter (Signed)
     Patient  visit on 05/28/2022  at [facility] was for hip pain.  Have you been able to follow up with your primary care physician? Yes, PCP office contacted patient.  The patient was or was not able to obtain any needed medicine or equipment. Patient is able to obtain medication.  Are there diet recommendations that you are having difficulty following? No  Patient expresses understanding of discharge instructions and education provided has no other needs at this time. Yes   Meredith [name] [facility]  [facility] Population Health Community Resource Care Guide   [email or web address]  ?? [phone]   Website: [email or web address]  [email or web address]

## 2022-06-01 NOTE — Patient Instructions (Signed)
Description   Skip tomorrow's dosage of Warfarin, then start taking 1 tablet daily except 1.5 tablets on Tuesday  Recheck INR in 2 weeks. Call with any medication changes or if you are scheduled for surgery [facility] [phone] Main [phone]

## 2022-06-04 DIAGNOSIS — M5451 Vertebrogenic low back pain: Secondary | ICD-10-CM | POA: Diagnosis not present

## 2022-06-05 ENCOUNTER — Ambulatory Visit (INDEPENDENT_AMBULATORY_CARE_PROVIDER_SITE_OTHER): Payer: Medicare Other | Admitting: Psychiatry

## 2022-06-05 ENCOUNTER — Encounter: Payer: Self-pay | Admitting: Psychiatry

## 2022-06-05 DIAGNOSIS — F319 Bipolar disorder, unspecified: Secondary | ICD-10-CM | POA: Diagnosis not present

## 2022-06-05 DIAGNOSIS — G3184 Mild cognitive impairment, so stated: Secondary | ICD-10-CM

## 2022-06-05 DIAGNOSIS — F411 Generalized anxiety disorder: Secondary | ICD-10-CM

## 2022-06-05 DIAGNOSIS — F4310 Post-traumatic stress disorder, unspecified: Secondary | ICD-10-CM | POA: Diagnosis not present

## 2022-06-05 DIAGNOSIS — Z8669 Personal history of other diseases of the nervous system and sense organs: Secondary | ICD-10-CM | POA: Diagnosis not present

## 2022-06-05 DIAGNOSIS — F4001 Agoraphobia with panic disorder: Secondary | ICD-10-CM

## 2022-06-05 DIAGNOSIS — F5105 Insomnia due to other mental disorder: Secondary | ICD-10-CM

## 2022-06-05 NOTE — Progress Notes (Signed)
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 see review of systems for further details on the patient's review from today.   Objective:   Physical Exam:  LMP  (LMP Unknown)   Physical Exam Constitutional:      General: She is not in acute distress.    Appearance: She is well-developed. She is obese.  Musculoskeletal:        General: No deformity.  Neurological:     Mental Status: She is alert and oriented to person, place, and time.     Motor: No weakness.     Coordination: Coordination normal.     Gait: Gait normal.     Comments: No cane and gate quick  Psychiatric:        Attention and Perception: Perception normal. She does not perceive auditory or visual hallucinations.        Mood and Affect: Mood is anxious. Mood is not depressed. Affect is not labile, blunt, angry, tearful or inappropriate.        Speech: Speech normal. Speech is not rapid and pressured or slurred.        Behavior: Behavior normal.        Thought Content: Thought content normal. Thought content is not delusional. Thought content does not  include homicidal or suicidal ideation. Thought content does not include suicidal plan.        Cognition and Memory: Cognition is not impaired. She exhibits impaired recent memory.     Comments: Insight and judgment fair No delusions.  Cognition appears baseline but she feels it's impaired Neat and pleasant Talkative without pressure. Somatic focus.     Lab Review:     Component Value Date/Time   NA 140 12/12/2020 1548   NA 141 12/07/2019 1022   K 4.5 12/12/2020 1548   CL 107 12/12/2020 1548   CO2 26 12/12/2020 1548   GLUCOSE 94 12/12/2020 1548   BUN 14 12/12/2020 1548   BUN 15 12/07/2019 1022  CREATININE 0.99 12/12/2020 1548   CREATININE 1.02 (H) 11/11/2020 1116   CALCIUM 9.5 12/12/2020 1548   PROT 7.1 08/16/2020 1451   ALBUMIN 4.5 08/16/2020 1451   AST 14 (L) 08/16/2020 1451   ALT 10 08/16/2020 1451   ALKPHOS 56 08/16/2020 1451   BILITOT 0.8 08/16/2020 1451   GFRNONAA >60 12/12/2020 1548   GFRNONAA 61 09/02/2020 1552   GFRAA 71 09/02/2020 1552       Component Value Date/Time   WBC 5.5 03/13/2021 1414   WBC 5.7 11/11/2020 1116   RBC 4.60 03/13/2021 1414   RBC 4.45 11/11/2020 1116   HGB 13.9 03/13/2021 1414   HCT 40.0 03/13/2021 1414   PLT 193 03/13/2021 1414   MCV 87 03/13/2021 1414   MCH 30.2 03/13/2021 1414   MCH 29.7 11/11/2020 1116   MCHC 34.8 03/13/2021 1414   MCHC 32.8 11/11/2020 1116   RDW 15.0 03/13/2021 1414   LYMPHSABS 1,448 11/11/2020 1116   MONOABS 0.9 07/15/2019 1150   EOSABS 479 11/11/2020 1116   BASOSABS 63 11/11/2020 1116    Lithium Lvl  Date Value Ref Range Status  08/18/2020 0.17 (L) 0.60 - 1.20 mmol/L Final    Comment:    Performed at [facility], [address], [place], [place] [address]    May 20, 2019 lithium level 0.9 on 450 mg daily and creatinine was 1.1 with normal calcium 07/21/19 lithium 0.4 on 450 mg daily, normal CMP  No results found for: "PHENYTOIN", "PHENOBARB", "VALPROATE", "CBMZ"   .res Assessment:  Plan:     Gagandeep was seen today for follow-up, anxiety, depression and stress.  Diagnoses and all orders for this visit:  Bipolar affective disorder, rapid cycling  Generalized anxiety disorder  Panic disorder with agoraphobia  PTSD (post-traumatic stress disorder)  Mild cognitive impairment  Insomnia due to mental condition  History of encephalopathy  History of lithium toxicity Hx repeated bouts of encephalopathy  Greater than 50% of 30 min face to face time with patient was spent on counseling and coordination of care. We discussed the following.  Saje is a chronically mentally ill patient with chronic depression and chronic anxiety and multiple med failures.  Overall is more stable psychiatrically on lower-dose quetiapine than in the past    [facility] for acute encephalopathy 07/2020.  The cause was unknown but presumed to be medication related.   Cognition appears stable.  Using pillbox to help compliance.  Disc danger of mixing up or doubling up meds.  She fills box herself.  Rec she get help with this. Cannot afford branded meds which prevents [place] from using some of the bipolar depression meds like Vraylar.  Few options for depression and anxiety therefore increased Seroquel to 300 mg HS  has doing ok with this so far. Caution re: sedative risks emphasiszed and fall risk.  Option Trileptal not as good for depression. Afraid of going lower in Seroquel bc fear of relapse as it is the main mood stabilizer.    Push fluids.  Has to remind herself. Emphasized again.  Says she's doing well with this.   Discussed potential metabolic side effects associated with atypical antipsychotics, as well as potential risk for movement side effects. Advised pt to contact office if movement side effects occur.   OK Ativan prn for dental procedure 1-2 mg.  No driving after it.  PCP Monia Pouch, Meridee Score  Continue therapy with Dr. Dellia Cloud q 2 weeks. Consider better medication  coverage when  gets new insurance Discussed this issue with her again although I think she again shows a cheaper plan which will limit medication options. Supportive therapy dealing with ill friends.  No med changes indicated.  Current psych meds Seroquel 300 HS are preventing mood cycling and helping depression Continue trazodone 50 mg HS Continue restarted buspirone 15 BID helped anxiety.  Follow-up  3-4  mos  Meredith Staggers MD, DFAPA  Please see After Visit Summary for patient specific instructions.  Future Appointments  Date Time Provider Department Center  06/06/2022  1:00 PM Haze Rushing, PhD [facility] None  06/14/2022  2:00 PM [facility] None  06/20/2022  1:00 PM Haze Rushing, PhD [facility] None  07/04/2022  1:00 PM Haze Rushing, PhD [facility] None  07/18/2022  1:00 PM Haze Rushing, PhD [facility] None  08/01/2022  1:00 PM Haze Rushing, PhD [facility] None  08/15/2022  1:00 PM Haze Rushing, PhD [facility] None    No orders of the defined types were placed in this encounter.      -------------------------------

## 2022-06-06 ENCOUNTER — Ambulatory Visit (INDEPENDENT_AMBULATORY_CARE_PROVIDER_SITE_OTHER): Payer: Medicare Other | Admitting: Psychology

## 2022-06-06 DIAGNOSIS — F319 Bipolar disorder, unspecified: Secondary | ICD-10-CM | POA: Diagnosis not present

## 2022-06-06 NOTE — Progress Notes (Signed)
                                 Meredith [name] is a 72 y.o. female patient   Date: 06/06/2022  Treatment Plan: Diagnosis F31.81 (Bipolar II disorder) [n/a]  Symptoms Depressed or irritable mood. (Status: maintained) -- No Description Entered  Lack of energy. (Status: maintained) -- No Description Entered  Social withdrawal. (Status: maintained) -- No Description Entered  Medication Status compliance  Safety none  If Suicidal or Homicidal State Action Taken: unspecified  Current Risk: low Medications Abilify (Dosage: 30mg )  Hydocodone (Dosage: 7.5mg )  Lamotrigine (Dosage: 200mg /day)  Lithium (Dosage: 450mg /day)  Seroquel (Dosage: 200mg /day)  Objectives Related Problem: Develop healthy cognitive patterns and beliefs about self and the world that lead to alleviation and help prevent the relapse of mood episodes. Description: Verbalize grief, fear, and anger regarding real or imagined losses in life. Target Date: 2022-08-10 Frequency: Daily Modality: individual Progress: 80%  Related Problem: Develop healthy cognitive patterns and beliefs about self and the world that lead to alleviation and help prevent the relapse of mood episodes. Description: Take prescribed medications as directed. Target Date: 2022-02-08 Frequency: Daily Modality: individual Progress: 100%  Related Problem: Develop healthy cognitive patterns and beliefs about self and the world that lead to alleviation and help prevent the relapse of mood episodes. Description: Describe mood state, energy level, amount of control over thoughts, and sleeping pattern. Target Date: 2022-08-10 Frequency: Daily Modality: individual Progress: 75%  Client Response full compliance  Service Location Location, [address] [place], [place] [address]  Service Code cpt [phone] P Neuropsych. testing  Related past to present  Self-monitoring  Self care activities  Emotion regulation  skills  Validate/empathize  Facilitate problem solving  Normalize/Reframe  Journaling  Comments  Dx.: F31.81  Goals: States she is seeking emotional stability. She needs guidance in managing some of her physical/medical challenges and limitations. Also, have some interpersonal challenges and seeks feedback on addressing these difficulties.Revised target date is 6-24.  Meds: Seroquel (300mg  daily), Hydrocodone (7.5 mg up to 3x/day), Buspar (10mg  twice daily).  Patient was seen via video. She is at home and I am at home office. Meredith [name] was in the emergency room because she was in so much pain in her hips. It was related to bursitis. She says her nurse practitioner at pain management suggested she see a doctor at [facility]. She saw Dr. Jennelle Human yesterday and said he did not make any medication changes.  She reports that she continues to do word games to keep her mind sharp.Her moods are stable and she is feeling optimistic about her future.                                   [name]                                                  [name], PhD Time: 1:15p-2:00p 45 minutes

## 2022-06-14 ENCOUNTER — Ambulatory Visit: Payer: Medicare Other | Attending: Cardiology | Admitting: *Deleted

## 2022-06-14 DIAGNOSIS — Z5181 Encounter for therapeutic drug level monitoring: Secondary | ICD-10-CM | POA: Insufficient documentation

## 2022-06-14 DIAGNOSIS — I480 Paroxysmal atrial fibrillation: Secondary | ICD-10-CM

## 2022-06-14 LAB — POCT INR: INR: 3 (ref 2.0–3.0)

## 2022-06-14 NOTE — Patient Instructions (Signed)
Description   Continue taking warfarin 1 tablet daily except 1.5 tablets on Tuesday. Recheck INR in 4 weeks. Call with any medication changes or if you are scheduled for surgery [facility] [phone] Main [phone]

## 2022-06-19 DIAGNOSIS — M542 Cervicalgia: Secondary | ICD-10-CM | POA: Diagnosis not present

## 2022-06-19 DIAGNOSIS — G43719 Chronic migraine without aura, intractable, without status migrainosus: Secondary | ICD-10-CM | POA: Diagnosis not present

## 2022-06-19 DIAGNOSIS — M791 Myalgia, unspecified site: Secondary | ICD-10-CM | POA: Diagnosis not present

## 2022-06-19 DIAGNOSIS — G518 Other disorders of facial nerve: Secondary | ICD-10-CM | POA: Diagnosis not present

## 2022-06-20 ENCOUNTER — Ambulatory Visit (INDEPENDENT_AMBULATORY_CARE_PROVIDER_SITE_OTHER): Payer: Medicare Other | Admitting: Psychology

## 2022-06-20 DIAGNOSIS — F319 Bipolar disorder, unspecified: Secondary | ICD-10-CM | POA: Diagnosis not present

## 2022-06-20 NOTE — Progress Notes (Signed)
                                                Meredith [name] is a 72 y.o. female patient   Date: 06/20/2022  Treatment Plan: Diagnosis F31.81 (Bipolar II disorder) [n/a]  Symptoms Depressed or irritable mood. (Status: maintained) -- No Description Entered  Lack of energy. (Status: maintained) -- No Description Entered  Social withdrawal. (Status: maintained) -- No Description Entered  Medication Status compliance  Safety none  If Suicidal or Homicidal State Action Taken: unspecified  Current Risk: low Medications Abilify (Dosage: )  Hydocodone (Dosage: 7.5mg )  Lamotrigine (Dosage: /day)  Lithium (Dosage: /day)  Seroquel (Dosage: /day)  Objectives Related Problem: Develop healthy cognitive patterns and beliefs about self and the world that lead to alleviation and help prevent the relapse of mood episodes. Description: Verbalize grief, fear, and anger regarding real or imagined losses in life. Target Date: 2022-08-10 Frequency: Daily Modality: individual Progress: 80%  Related Problem: Develop healthy cognitive patterns and beliefs about self and the world that lead to alleviation and help prevent the relapse of mood episodes. Description: Take prescribed medications as directed. Target Date: 2022-02-08 Frequency: Daily Modality: individual Progress: 100%  Related Problem: Develop healthy cognitive patterns and beliefs about self and the world that lead to alleviation and help prevent the relapse of mood episodes. Description: Describe mood state, energy level, amount of control over thoughts, and sleeping pattern. Target Date: 2022-08-10 Frequency: Daily Modality: individual Progress: 75%  Client Response full compliance  Service Location Location, [address] [place], [place] [address]  Service Code cpt [phone] P Neuropsych. testing  Related past to present  Self-monitoring  Self care  activities  Emotion regulation skills  Validate/empathize  Facilitate problem solving  Normalize/Reframe  Journaling  Comments  Dx.: F31.81  Goals: States she is seeking emotional stability. She needs guidance in managing some of her physical/medical challenges and limitations. Also, have some interpersonal challenges and seeks feedback on addressing these difficulties.Revised target date is 6-24.  Meds: Seroquel (  daily), Hydrocodone (7.5 mg up to 3x/day), Buspar (  twice daily).  Patient was seen via video. She is at home and I am at home office. Meredith [name] says she continues to suffer back pain and pain behind her knee. It is the osteoarthritis. She is feeling some financial pressure as some of her costs are going up. We talked about ways to help manage her anxiety and to minimize triggers.                                     [name]                                                  [name], PhD Time: 1:15p-2:00p 45 minutes

## 2022-06-21 DIAGNOSIS — M47816 Spondylosis without myelopathy or radiculopathy, lumbar region: Secondary | ICD-10-CM | POA: Diagnosis not present

## 2022-06-25 DIAGNOSIS — M25562 Pain in left knee: Secondary | ICD-10-CM | POA: Diagnosis not present

## 2022-06-26 DIAGNOSIS — M25562 Pain in left knee: Secondary | ICD-10-CM | POA: Diagnosis not present

## 2022-06-26 DIAGNOSIS — G894 Chronic pain syndrome: Secondary | ICD-10-CM | POA: Diagnosis not present

## 2022-06-26 DIAGNOSIS — M25551 Pain in right hip: Secondary | ICD-10-CM | POA: Diagnosis not present

## 2022-06-26 DIAGNOSIS — Z79891 Long term (current) use of opiate analgesic: Secondary | ICD-10-CM | POA: Diagnosis not present

## 2022-06-26 DIAGNOSIS — M47816 Spondylosis without myelopathy or radiculopathy, lumbar region: Secondary | ICD-10-CM | POA: Diagnosis not present

## 2022-06-26 DIAGNOSIS — M25552 Pain in left hip: Secondary | ICD-10-CM | POA: Diagnosis not present

## 2022-06-26 DIAGNOSIS — M1612 Unilateral primary osteoarthritis, left hip: Secondary | ICD-10-CM | POA: Diagnosis not present

## 2022-06-26 DIAGNOSIS — M545 Low back pain, unspecified: Secondary | ICD-10-CM | POA: Diagnosis not present

## 2022-06-28 DIAGNOSIS — M25562 Pain in left knee: Secondary | ICD-10-CM | POA: Diagnosis not present

## 2022-07-03 DIAGNOSIS — M25562 Pain in left knee: Secondary | ICD-10-CM | POA: Diagnosis not present

## 2022-07-04 ENCOUNTER — Other Ambulatory Visit: Payer: Self-pay | Admitting: Sports Medicine

## 2022-07-04 ENCOUNTER — Ambulatory Visit (INDEPENDENT_AMBULATORY_CARE_PROVIDER_SITE_OTHER): Payer: Medicare Other | Admitting: Psychology

## 2022-07-04 DIAGNOSIS — M25562 Pain in left knee: Secondary | ICD-10-CM

## 2022-07-04 DIAGNOSIS — F319 Bipolar disorder, unspecified: Secondary | ICD-10-CM | POA: Diagnosis not present

## 2022-07-04 NOTE — Progress Notes (Signed)
                                                               Meredith [name] is a 72 y.o. female patient   Date: 07/04/2022  Treatment Plan: Diagnosis F31.81 (Bipolar II disorder) [n/a]  Symptoms Depressed or irritable mood. (Status: maintained) -- No Description Entered  Lack of energy. (Status: maintained) -- No Description Entered  Social withdrawal. (Status: maintained) -- No Description Entered  Medication Status compliance  Safety none  If Suicidal or Homicidal State Action Taken: unspecified  Current Risk: low Medications Abilify (Dosage: 30mg )  Hydocodone (Dosage: 7.5mg )  Lamotrigine (Dosage: 200mg /day)  Lithium (Dosage: 450mg /day)  Seroquel (Dosage: 200mg /day)  Objectives Related Problem: Develop healthy cognitive patterns and beliefs about self and the world that lead to alleviation and help prevent the relapse of mood episodes. Description: Verbalize grief, fear, and anger regarding real or imagined losses in life. Target Date: 2022-08-10 Frequency: Daily Modality: individual Progress: 80%  Related Problem: Develop healthy cognitive patterns and beliefs about self and the world that lead to alleviation and help prevent the relapse of mood episodes. Description: Take prescribed medications as directed. Target Date: 2022-02-08 Frequency: Daily Modality: individual Progress: 100%  Related Problem: Develop healthy cognitive patterns and beliefs about self and the world that lead to alleviation and help prevent the relapse of mood episodes. Description: Describe mood state, energy level, amount of control over thoughts, and sleeping pattern. Target Date: 2022-08-10 Frequency: Daily Modality: individual Progress: 75%  Client Response full compliance  Service Location Location, [address] [place], [place] [address]  Service Code cpt [phone] P Neuropsych. testing  Related past to present   Self-monitoring  Self care activities  Emotion regulation skills  Validate/empathize  Facilitate problem solving  Normalize/Reframe  Journaling  Comments  Dx.: F31.81  Goals: States she is seeking emotional stability. She needs guidance in managing some of her physical/medical challenges and limitations. Also, have some interpersonal challenges and seeks feedback on addressing these difficulties.Revised target date is 6-24.  Meds: Seroquel (300mg  daily), Hydrocodone (7.5 mg up to 3x/day), Buspar (10mg  twice daily).  Patient was seen via video. She is at home and I am at home office. Meredith [name] says she is discouraged about her knee, which has been diagnosed as a hairline fracture. She has to stay off of it and is "stuck" at home. Told it will take 4-6 weeks to heal. She says that she is very tired of spending almost all of her time at doctor's offices. Sunday was her birthday and she had a nice celebration with her brother and niece. She talked about ways to distract herself, but feels there is a lack of options available. We will continue to explore.                                      [name]                                                  [name], PhD Time: 1:15p-2:00p 45 minutes

## 2022-07-10 DIAGNOSIS — M7061 Trochanteric bursitis, right hip: Secondary | ICD-10-CM | POA: Diagnosis not present

## 2022-07-10 DIAGNOSIS — M7062 Trochanteric bursitis, left hip: Secondary | ICD-10-CM | POA: Diagnosis not present

## 2022-07-10 DIAGNOSIS — M160 Bilateral primary osteoarthritis of hip: Secondary | ICD-10-CM | POA: Diagnosis not present

## 2022-07-12 DIAGNOSIS — J3089 Other allergic rhinitis: Secondary | ICD-10-CM | POA: Diagnosis not present

## 2022-07-12 DIAGNOSIS — R748 Abnormal levels of other serum enzymes: Secondary | ICD-10-CM | POA: Diagnosis not present

## 2022-07-12 DIAGNOSIS — J300 Vasomotor rhinitis: Secondary | ICD-10-CM | POA: Diagnosis not present

## 2022-07-12 DIAGNOSIS — T63481D Toxic effect of venom of other arthropod, accidental (unintentional), subsequent encounter: Secondary | ICD-10-CM | POA: Diagnosis not present

## 2022-07-13 ENCOUNTER — Ambulatory Visit: Payer: Medicare Other | Attending: Cardiology

## 2022-07-13 DIAGNOSIS — I480 Paroxysmal atrial fibrillation: Secondary | ICD-10-CM | POA: Diagnosis not present

## 2022-07-13 DIAGNOSIS — Z5181 Encounter for therapeutic drug level monitoring: Secondary | ICD-10-CM | POA: Diagnosis not present

## 2022-07-13 LAB — POCT INR: INR: 2.8 (ref 2.0–3.0)

## 2022-07-13 NOTE — Patient Instructions (Signed)
Continue taking warfarin 1 tablet daily except 1.5 tablets on Tuesday. Recheck INR in 6 weeks. Call with any medication changes or if you are scheduled for surgery [facility] [phone] Main [phone]

## 2022-07-14 ENCOUNTER — Ambulatory Visit
Admission: RE | Admit: 2022-07-14 | Discharge: 2022-07-14 | Disposition: A | Discharge status: Home / Self Care | Payer: Medicare Other | Source: Ambulatory Visit | Attending: Sports Medicine | Admitting: Sports Medicine

## 2022-07-14 DIAGNOSIS — D7589 Other specified diseases of blood and blood-forming organs: Secondary | ICD-10-CM | POA: Diagnosis not present

## 2022-07-14 DIAGNOSIS — M7121 Synovial cyst of popliteal space [Baker], right knee: Secondary | ICD-10-CM | POA: Diagnosis not present

## 2022-07-14 DIAGNOSIS — M25562 Pain in left knee: Secondary | ICD-10-CM | POA: Diagnosis not present

## 2022-07-17 DIAGNOSIS — M791 Myalgia, unspecified site: Secondary | ICD-10-CM | POA: Diagnosis not present

## 2022-07-17 DIAGNOSIS — G43719 Chronic migraine without aura, intractable, without status migrainosus: Secondary | ICD-10-CM | POA: Diagnosis not present

## 2022-07-17 DIAGNOSIS — M542 Cervicalgia: Secondary | ICD-10-CM | POA: Diagnosis not present

## 2022-07-17 DIAGNOSIS — G518 Other disorders of facial nerve: Secondary | ICD-10-CM | POA: Diagnosis not present

## 2022-07-18 ENCOUNTER — Ambulatory Visit (INDEPENDENT_AMBULATORY_CARE_PROVIDER_SITE_OTHER): Payer: Medicare Other | Admitting: Psychology

## 2022-07-18 DIAGNOSIS — F319 Bipolar disorder, unspecified: Secondary | ICD-10-CM | POA: Diagnosis not present

## 2022-07-18 NOTE — Progress Notes (Signed)
Meredith [name] is a 72 y.o. female patient   Date: 07/18/2022  Treatment Plan: Diagnosis F31.81 (Bipolar II disorder) [n/a]  Symptoms Depressed or irritable mood. (Status: maintained) -- No Description Entered  Lack of energy. (Status: maintained) -- No Description Entered  Social withdrawal. (Status: maintained) -- No Description Entered  Medication Status compliance  Safety none  If Suicidal or Homicidal State Action Taken: unspecified  Current Risk: low Medications Abilify (Dosage: 30mg )  Hydocodone (Dosage: 7.5mg )  Lamotrigine (Dosage: 200mg /day)  Lithium (Dosage: 450mg /day)  Seroquel (Dosage: 200mg /day)  Objectives Related Problem: Develop healthy cognitive patterns and beliefs about self and the world that lead to alleviation and help prevent the relapse of mood episodes. Description: Verbalize grief, fear, and anger regarding real or imagined losses in life. Target Date: 2023-02-09 Frequency: Daily Modality: individual Progress: 80%  Related Problem: Develop healthy cognitive patterns and beliefs about self and the world that lead to alleviation and help prevent the relapse of mood episodes. Description: Take prescribed medications as directed. Target Date: 2022-02-08 Frequency: Daily Modality: individual Progress: 100%  Related Problem: Develop healthy cognitive patterns and beliefs about self and the world that lead to alleviation and help prevent the relapse of mood episodes. Description: Describe mood state, energy level, amount of control over thoughts, and sleeping pattern. Target Date: 2023-02-09 Frequency: Daily Modality: individual Progress: 75%  Client Response full compliance  Service Location Location, [address] [place], [place] [address]  Service Code cpt [phone] P Neuropsych. testing   Related past to present  Self-monitoring  Self care activities  Emotion regulation skills  Validate/empathize  Facilitate problem solving  Normalize/Reframe  Journaling  Comments  Dx.: F31.81  Goals: States she is seeking emotional stability. She needs guidance in managing some of her physical/medical challenges and limitations. Also, have some interpersonal challenges and seeks feedback on addressing these difficulties.Revised target date is 6-24.  Meds: Seroquel (300mg  daily), Hydrocodone (7.5 mg up to 3x/day), Buspar (10mg  twice daily).  Patient was seen in the office for a face to face session.  Meredith [name] had an MRI of her knee, but does not yet have the results. She also had her hips evaluated and "it was a waste of time". She went to allergy doctor and neurologist in addition to these other two appointments. Is doing a good job of Manufacturing engineer. She was thrilled to get a card and call from son Meredith [name] on Mother's Day. That is her only communication with her children. She has been going to church if her pain is manageable.  She claims that her moods are relatively stable, with some good and some bad days. Will see Dr. Jennelle Human again in July, but does not anticipate any medicine changes. No plans for summer. Does not anticipate going out much as she does not like heat.                                            [name]  [name], PhD Time: 12:40p-1:30p 50 minutes

## 2022-07-24 DIAGNOSIS — G894 Chronic pain syndrome: Secondary | ICD-10-CM | POA: Diagnosis not present

## 2022-07-24 DIAGNOSIS — M533 Sacrococcygeal disorders, not elsewhere classified: Secondary | ICD-10-CM | POA: Diagnosis not present

## 2022-07-24 DIAGNOSIS — Z79891 Long term (current) use of opiate analgesic: Secondary | ICD-10-CM | POA: Diagnosis not present

## 2022-07-24 DIAGNOSIS — M25562 Pain in left knee: Secondary | ICD-10-CM | POA: Diagnosis not present

## 2022-07-24 DIAGNOSIS — M545 Low back pain, unspecified: Secondary | ICD-10-CM | POA: Diagnosis not present

## 2022-07-24 DIAGNOSIS — M47816 Spondylosis without myelopathy or radiculopathy, lumbar region: Secondary | ICD-10-CM | POA: Diagnosis not present

## 2022-08-01 ENCOUNTER — Ambulatory Visit (INDEPENDENT_AMBULATORY_CARE_PROVIDER_SITE_OTHER): Payer: Medicare Other | Admitting: Psychology

## 2022-08-01 DIAGNOSIS — F319 Bipolar disorder, unspecified: Secondary | ICD-10-CM | POA: Diagnosis not present

## 2022-08-01 DIAGNOSIS — M25562 Pain in left knee: Secondary | ICD-10-CM | POA: Diagnosis not present

## 2022-08-01 NOTE — Progress Notes (Signed)
Meredith [name] is a 72 y.o. female patient   Date: 08/01/2022  Treatment Plan: Diagnosis F31.81 (Bipolar II disorder) [n/a]  Symptoms Depressed or irritable mood. (Status: maintained) -- No Description Entered  Lack of energy. (Status: maintained) -- No Description Entered  Social withdrawal. (Status: maintained) -- No Description Entered  Medication Status compliance  Safety none  If Suicidal or Homicidal State Action Taken: unspecified  Current Risk: low Medications Abilify (Dosage: 30mg )  Hydocodone (Dosage: 7.5mg )  Lamotrigine (Dosage: 200mg /day)  Lithium (Dosage: 450mg /day)  Seroquel (Dosage: 200mg /day)  Objectives Related Problem: Develop healthy cognitive patterns and beliefs about self and the world that lead to alleviation and help prevent the relapse of mood episodes. Description: Verbalize grief, fear, and anger regarding real or imagined losses in life. Target Date: 2023-02-09 Frequency: Daily Modality: individual Progress: 80%  Related Problem: Develop healthy cognitive patterns and beliefs about self and the world that lead to alleviation and help prevent the relapse of mood episodes. Description: Take prescribed medications as directed. Target Date: 2022-02-08 Frequency: Daily Modality: individual Progress: 100%  Related Problem: Develop healthy cognitive patterns and beliefs about self and the world that lead to alleviation and help prevent the relapse of mood episodes. Description: Describe mood state, energy level, amount of control over thoughts, and sleeping pattern. Target Date: 2023-02-09 Frequency: Daily Modality: individual Progress: 75%  Client Response full compliance  Service Location Location, [address] [place], [place] [address]  Service Code cpt  [id number] P Neuropsych. testing  Related past to present  Self-monitoring  Self care activities  Emotion regulation skills  Validate/empathize  Facilitate problem solving  Normalize/Reframe  Journaling  Comments  Dx.: F31.81  Goals: States she is seeking emotional stability. She needs guidance in managing some of her physical/medical challenges and limitations. Also, have some interpersonal challenges and seeks feedback on addressing these difficulties.Revised target date is 12-24.  Meds: Seroquel (300mg  daily), Hydrocodone (7.5 mg up to 3x/day), Buspar (10mg  twice daily).  Patient was seen in the office for a face to face session.  [name] says that she is still having knee pain and will see the sports medicine doctor today. Said she is tired of going to the doctors. She talked about her ongoing struggles with health issues. Her moods have been stable and she does not report any episodes of depression. She is still going to church on a regular basis and finds that it is comforting and supportive. The church has grown significantly in the time she has been a member. She will decide whether to stay after she hears the new minister.                                                 [name]  [name], PhD Time: 1:10p-2:00p 50 minutes

## 2022-08-06 DIAGNOSIS — M533 Sacrococcygeal disorders, not elsewhere classified: Secondary | ICD-10-CM | POA: Diagnosis not present

## 2022-08-08 DIAGNOSIS — M25562 Pain in left knee: Secondary | ICD-10-CM | POA: Diagnosis not present

## 2022-08-14 DIAGNOSIS — M791 Myalgia, unspecified site: Secondary | ICD-10-CM | POA: Diagnosis not present

## 2022-08-14 DIAGNOSIS — G518 Other disorders of facial nerve: Secondary | ICD-10-CM | POA: Diagnosis not present

## 2022-08-14 DIAGNOSIS — M25562 Pain in left knee: Secondary | ICD-10-CM | POA: Diagnosis not present

## 2022-08-14 DIAGNOSIS — G43719 Chronic migraine without aura, intractable, without status migrainosus: Secondary | ICD-10-CM | POA: Diagnosis not present

## 2022-08-14 DIAGNOSIS — M542 Cervicalgia: Secondary | ICD-10-CM | POA: Diagnosis not present

## 2022-08-15 ENCOUNTER — Ambulatory Visit: Payer: Medicare Other | Admitting: Psychology

## 2022-08-15 DIAGNOSIS — F319 Bipolar disorder, unspecified: Secondary | ICD-10-CM

## 2022-08-15 NOTE — Progress Notes (Addendum)
   Meredith [name] is a 72 y.o. female patient   Date: 08/15/2022  Treatment Plan: Diagnosis F31.81 (Bipolar II disorder) [n/a]  Symptoms Depressed or irritable mood. (Status: maintained) -- No Description Entered  Lack of energy. (Status: maintained) -- No Description Entered  Social withdrawal. (Status: maintained) -- No Description Entered  Medication Status compliance  Safety none  If Suicidal or Homicidal State Action Taken: unspecified  Current Risk: low Medications Abilify (Dosage: 30mg )  Hydocodone (Dosage: 7.5mg )  Lamotrigine (Dosage: 200mg /day)  Lithium (Dosage: 450mg /day)  Seroquel (Dosage: 200mg /day)  Objectives Related Problem: Develop healthy cognitive patterns and beliefs about self and the world that lead to alleviation and help prevent the relapse of mood episodes. Description: Verbalize grief, fear, and anger regarding real or imagined losses in life. Target Date: 2023-02-09 Frequency: Daily Modality: individual Progress: 80%  Related Problem: Develop healthy cognitive patterns and beliefs about self and the world that lead to alleviation and help prevent the relapse of mood episodes. Description: Take prescribed medications as directed. Target Date: 2022-02-08 Frequency: Daily Modality: individual Progress: 100%  Related Problem: Develop healthy cognitive patterns and beliefs about self and the world that lead to alleviation and help prevent the relapse of mood episodes. Description: Describe mood state, energy level, amount of control over thoughts, and sleeping pattern. Target Date: 2023-02-09 Frequency: Daily Modality: individual Progress: 75%  Client Response full compliance  Service Location Location, [address] [place], [place] [address]  Service Code cpt [phone] P Neuropsych. testing  Related past to present  Self-monitoring  Self care activities  Emotion regulation skills  Validate/empathize  Facilitate problem solving   Normalize/Reframe  Journaling  Comments  Dx.: F31.81  Goals: States she is seeking emotional stability. She needs guidance in managing some of her physical/medical challenges and limitations. Also, have some interpersonal challenges and seeks feedback on addressing these difficulties.Revised target date is 12-24.  Meds: Seroquel (300mg  daily), Hydrocodone (7.5 mg up to 3x/day), Buspar (10mg  twice daily).  Patient agrees to be seen for a virtual Public affairs consultant) appointment and understands the limitations of this platform. She was at home and provider was in his office.  Meredith [name] says she is feeling up and down. She is very frustrated with her medical care. She is having to schedule numerous appointments and is not getting her issues addressed (particularly orthopedic). She is trying to manage her frustrations, which are mounting. She is able to not over-react at this time, but concerned if she can sustain.                                                     [name]                                                  [name], PhD Time: 1:10p-2:00p 50 minutes

## 2022-08-17 DIAGNOSIS — M25562 Pain in left knee: Secondary | ICD-10-CM | POA: Diagnosis not present

## 2022-08-18 ENCOUNTER — Other Ambulatory Visit: Payer: Self-pay | Admitting: Cardiovascular Disease

## 2022-08-18 DIAGNOSIS — I480 Paroxysmal atrial fibrillation: Secondary | ICD-10-CM

## 2022-08-20 NOTE — Telephone Encounter (Signed)
Warfarin 2.5mg  Afib Last INR 07/13/22 Last OV 04/24/22

## 2022-08-21 DIAGNOSIS — M47816 Spondylosis without myelopathy or radiculopathy, lumbar region: Secondary | ICD-10-CM | POA: Diagnosis not present

## 2022-08-21 DIAGNOSIS — M533 Sacrococcygeal disorders, not elsewhere classified: Secondary | ICD-10-CM | POA: Diagnosis not present

## 2022-08-21 DIAGNOSIS — G894 Chronic pain syndrome: Secondary | ICD-10-CM | POA: Diagnosis not present

## 2022-08-21 DIAGNOSIS — M25562 Pain in left knee: Secondary | ICD-10-CM | POA: Diagnosis not present

## 2022-08-21 DIAGNOSIS — Z79891 Long term (current) use of opiate analgesic: Secondary | ICD-10-CM | POA: Diagnosis not present

## 2022-08-21 DIAGNOSIS — M545 Low back pain, unspecified: Secondary | ICD-10-CM | POA: Diagnosis not present

## 2022-08-22 DIAGNOSIS — M25562 Pain in left knee: Secondary | ICD-10-CM | POA: Diagnosis not present

## 2022-08-24 ENCOUNTER — Ambulatory Visit: Payer: Medicare Other | Attending: Cardiovascular Disease

## 2022-08-24 DIAGNOSIS — Z5181 Encounter for therapeutic drug level monitoring: Secondary | ICD-10-CM | POA: Insufficient documentation

## 2022-08-24 DIAGNOSIS — M25562 Pain in left knee: Secondary | ICD-10-CM | POA: Diagnosis not present

## 2022-08-24 DIAGNOSIS — I480 Paroxysmal atrial fibrillation: Secondary | ICD-10-CM | POA: Insufficient documentation

## 2022-08-24 LAB — POCT INR: INR: 2.3 (ref 2.0–3.0)

## 2022-08-24 NOTE — Patient Instructions (Signed)
Continue taking warfarin 1 tablet daily except 1.5 tablets on Tuesday. Recheck INR in 6 weeks. Call with any medication changes or if you are scheduled for surgery [facility] [phone] Main [phone] 

## 2022-08-27 DIAGNOSIS — M25562 Pain in left knee: Secondary | ICD-10-CM | POA: Diagnosis not present

## 2022-08-29 ENCOUNTER — Ambulatory Visit (INDEPENDENT_AMBULATORY_CARE_PROVIDER_SITE_OTHER): Payer: Medicare Other | Admitting: Psychology

## 2022-08-29 DIAGNOSIS — F3181 Bipolar II disorder: Secondary | ICD-10-CM

## 2022-08-29 DIAGNOSIS — F319 Bipolar disorder, unspecified: Secondary | ICD-10-CM

## 2022-08-29 DIAGNOSIS — M25562 Pain in left knee: Secondary | ICD-10-CM | POA: Diagnosis not present

## 2022-08-29 NOTE — Progress Notes (Addendum)
                  Meredith [name] is a 72 y.o. female patient   Date: 08/29/2022  Treatment Plan: Diagnosis F31.81 (Bipolar II disorder) [n/a]  Symptoms Depressed or irritable mood. (Status: maintained) -- No Description Entered  Lack of energy. (Status: maintained) -- No Description Entered  Social withdrawal. (Status: maintained) -- No Description Entered  Medication Status compliance  Safety none  If Suicidal or Homicidal State Action Taken: unspecified  Current Risk: low Medications Abilify (Dosage: 30mg )  Hydocodone (Dosage: 7.5mg )  Lamotrigine (Dosage: 200mg /day)  Lithium (Dosage: 450mg /day)  Seroquel (Dosage: 200mg /day)  Objectives Related Problem: Develop healthy cognitive patterns and beliefs about self and the world that lead to alleviation and help prevent the relapse of mood episodes. Description: Verbalize grief, fear, and anger regarding real or imagined losses in life. Target Date: 2023-02-09 Frequency: Daily Modality: individual Progress: 80%  Related Problem: Develop healthy cognitive patterns and beliefs about self and the world that lead to alleviation and help prevent the relapse of mood episodes. Description: Take prescribed medications as directed. Target Date: 2022-02-08 Frequency: Daily Modality: individual Progress: 100%  Related Problem: Develop healthy cognitive patterns and beliefs about self and the world that lead to alleviation and help prevent the relapse of mood episodes. Description: Describe mood state, energy level, amount of control over thoughts, and sleeping pattern. Target Date: 2023-02-09 Frequency: Daily Modality: individual Progress: 75%  Client Response full compliance  Service Location Location, [address] [place], [place] [address]  Service Code cpt [phone] P Neuropsych. testing  Related past to present  Self-monitoring  Self care activities  Emotion regulation skills  Validate/empathize   Facilitate problem solving  Normalize/Reframe  Journaling  Comments  Dx.: F31.81  Goals: States she is seeking emotional stability. She needs guidance in managing some of her physical/medical challenges and limitations. Also, have some interpersonal challenges and seeks feedback on addressing these difficulties.Revised target date is 12-24.  Meds: Seroquel (300mg  daily), Hydrocodone (7.5 mg up to 3x/day), Buspar (10mg  twice daily).  Patient agreed to  a virtual Public affairs consultant) appointment and understands the limitations of this platform. She was at home and provider was in his office.  Meredith [name] says she has been going to PT and her knee hurts worse. She is getting it re-evaluated. She will see Dr. Jennelle Human next week, but doesn't foresee any medication changes. She isn't happy that she gains weight on Seroquel. She is mostly feeling good, but frustrated at not being very mobile due to her pain and the very hot weather. We talked about trying to stay active and maintain her small social life.                                                         [name]                                                  [name], PhD Time: 1:15p-2:00p 45 minutes

## 2022-09-01 ENCOUNTER — Other Ambulatory Visit: Payer: Self-pay | Admitting: Psychiatry

## 2022-09-01 DIAGNOSIS — F4001 Agoraphobia with panic disorder: Secondary | ICD-10-CM

## 2022-09-01 DIAGNOSIS — F5105 Insomnia due to other mental disorder: Secondary | ICD-10-CM

## 2022-09-01 DIAGNOSIS — F4310 Post-traumatic stress disorder, unspecified: Secondary | ICD-10-CM

## 2022-09-01 DIAGNOSIS — F411 Generalized anxiety disorder: Secondary | ICD-10-CM

## 2022-09-02 NOTE — Telephone Encounter (Signed)
Has appt 7/9

## 2022-09-04 ENCOUNTER — Encounter: Payer: Self-pay | Admitting: Psychiatry

## 2022-09-04 ENCOUNTER — Ambulatory Visit (INDEPENDENT_AMBULATORY_CARE_PROVIDER_SITE_OTHER): Payer: Medicare Other | Admitting: Psychiatry

## 2022-09-04 DIAGNOSIS — Z8669 Personal history of other diseases of the nervous system and sense organs: Secondary | ICD-10-CM

## 2022-09-04 DIAGNOSIS — F3132 Bipolar disorder, current episode depressed, moderate: Secondary | ICD-10-CM

## 2022-09-04 DIAGNOSIS — F5105 Insomnia due to other mental disorder: Secondary | ICD-10-CM

## 2022-09-04 DIAGNOSIS — F411 Generalized anxiety disorder: Secondary | ICD-10-CM

## 2022-09-04 DIAGNOSIS — F319 Bipolar disorder, unspecified: Secondary | ICD-10-CM | POA: Diagnosis not present

## 2022-09-04 DIAGNOSIS — F4001 Agoraphobia with panic disorder: Secondary | ICD-10-CM | POA: Diagnosis not present

## 2022-09-04 DIAGNOSIS — G3184 Mild cognitive impairment, so stated: Secondary | ICD-10-CM

## 2022-09-04 DIAGNOSIS — F4310 Post-traumatic stress disorder, unspecified: Secondary | ICD-10-CM | POA: Diagnosis not present

## 2022-09-04 MED ORDER — QUETIAPINE FUMARATE 300 MG PO TABS: 300.0000 mg | ORAL_TABLET | Freq: Every day | ORAL | 1 refills | Status: DC

## 2022-09-04 MED ORDER — BUSPIRONE HCL 15 MG PO TABS
15.0000 mg | ORAL_TABLET | Freq: Two times a day (BID) | ORAL | 1 refills | Status: DC
Start: 2022-09-04 — End: 2023-01-28

## 2022-09-04 MED ORDER — TRAZODONE HCL 50 MG PO TABS
50.0000 mg | ORAL_TABLET | Freq: Every evening | ORAL | 1 refills | Status: DC | PRN
Start: 2022-09-04 — End: 2023-04-01

## 2022-09-04 NOTE — Progress Notes (Signed)
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 file  Transportation Needs: Not on file  Physical Activity: Not on file  Stress: Not on file  Social Connections: Not on file  Intimate Partner Violence: Not on file    Past Medical History, Surgical history, Social history, and Family history were reviewed and updated as appropriate.   ADOPTED but has twin brother.  Please see review of systems for further details on the patient's review from today.   Objective:   Physical Exam:  LMP  (LMP Unknown)   Physical Exam Constitutional:      General: She is not in acute distress.    Appearance: She is well-developed. She is obese.  Musculoskeletal:        General: Signs of injury present. No deformity.  Neurological:     Mental Status: She is alert and oriented to person, place, and time.     Motor: No weakness.     Coordination: Coordination normal.     Gait: Gait abnormal.     Comments: No cane and gate quick  Psychiatric:        Attention and Perception: Perception normal. She does not perceive auditory or visual  hallucinations.        Mood and Affect: Mood is anxious. Mood is not depressed. Affect is not labile, blunt, angry, tearful or inappropriate.        Speech: Speech normal. Speech is not rapid and pressured or slurred.        Behavior: Behavior normal.        Thought Content: Thought content normal. Thought content is not delusional. Thought content does not include homicidal or suicidal ideation. Thought content does not include suicidal plan.        Cognition and Memory: Cognition is not impaired. She exhibits impaired recent memory.     Comments: Insight and judgment fair No delusions.  Cognition appears baseline but she feels it's impaired Neat  and pleasant Talkative without pressure. Somatic focus.     Lab Review:     Component Value Date/Time   NA 140 12/12/2020 1548   NA 141 12/07/2019 1022   K 4.5 12/12/2020 1548   CL 107 12/12/2020 1548   CO2 26 12/12/2020 1548   GLUCOSE 94 12/12/2020 1548   BUN 14 12/12/2020 1548   BUN 15 12/07/2019 1022   CREATININE 0.99 12/12/2020 1548   CREATININE 1.02 (H) 11/11/2020 1116   CALCIUM 9.5 12/12/2020 1548   PROT 7.1 08/16/2020 1451   ALBUMIN 4.5 08/16/2020 1451   AST 14 (L) 08/16/2020 1451   ALT 10 08/16/2020 1451   ALKPHOS 56 08/16/2020 1451   BILITOT 0.8 08/16/2020 1451   GFRNONAA >60 12/12/2020 1548   GFRNONAA 61 09/02/2020 1552   GFRAA 71 09/02/2020 1552       Component Value Date/Time   WBC 5.5 03/13/2021 1414   WBC 5.7 11/11/2020 1116   RBC 4.60 03/13/2021 1414   RBC 4.45 11/11/2020 1116   HGB 13.9 03/13/2021 1414   HCT 40.0 03/13/2021 1414   PLT 193 03/13/2021 1414   MCV 87 03/13/2021 1414   MCH 30.2 03/13/2021 1414   MCH 29.7 11/11/2020 1116   MCHC 34.8 03/13/2021 1414   MCHC 32.8 11/11/2020 1116   RDW 15.0 03/13/2021 1414   LYMPHSABS 1,448 11/11/2020 1116   MONOABS 0.9 07/15/2019 1150   EOSABS 479 11/11/2020 1116   BASOSABS 63 11/11/2020 1116    Lithium Lvl  Date Value Ref Range Status  08/18/2020 0.17  (L) 0.60 - 1.20 mmol/L Final    Comment:    Performed at [facility], [address] [place], [place] [address]    May 20, 2019 lithium level 0.9 on 450 mg daily and creatinine was 1.1 with normal calcium 07/21/19 lithium 0.4 on 450 mg daily, normal CMP  No results found for: "PHENYTOIN", "PHENOBARB", "VALPROATE", "CBMZ"   .res Assessment: Plan:     Anberlyn was seen today for follow-up, depression, anxiety, fatigue and stress.  Diagnoses and all orders for this visit:  Bipolar affective disorder, rapid cycling (HCC)  Generalized anxiety disorder  Panic disorder with agoraphobia  PTSD (post-traumatic stress disorder)  Mild cognitive impairment  Insomnia due to mental condition  History of encephalopathy  History of lithium toxicity Hx repeated bouts of encephalopathy  30 min face to face time with patient was spent on counseling and coordination of care. We discussed the following.  Chaelynn is a chronically mentally ill patient with chronic depression and chronic anxiety and multiple med failures.  Overall is more stable psychiatrically on lower-dose quetiapine than in the past  .  She is satisfied and doesn't want changes.  [facility] for acute encephalopathy 07/2020.  The cause was unknown but presumed to be medication related.   Cognition appears stable.  Using pillbox to help compliance.  Disc danger of mixing up or doubling up meds.  She fills box herself.  Rec she get help with this. Cannot afford branded meds which prevents [place] from using some of the bipolar depression meds like Vraylar.  Few options for depression and anxiety therefore increased Seroquel to 300 mg HS  has doing ok with this so far. Caution re: sedative risks emphasiszed and fall risk.  Option Trileptal not as good for depression. Afraid of going lower in Seroquel bc fear of relapse as it is the main mood stabilizer.    Push fluids.  Has to remind herself.  Emphasized again.  Says she's doing well  with this.   Discussed potential metabolic side effects associated with atypical antipsychotics, as well as potential risk for movement side effects. Advised pt to contact office if movement side effects occur.   OK Ativan prn for dental procedure 1-2 mg.  No driving after it.  PCP Monia Pouch, Meridee Score  Continue therapy with Dr. Dellia Cloud q 2 weeks. Consider better medication coverage when gets new insurance Discussed this issue with her again although I think she again shows a cheaper plan which will limit medication options. Supportive therapy dealing with ill friends and chronic health issues   No med changes indicated.  Current psych meds Seroquel 300 HS are preventing mood cycling and helping depression Continue trazodone 50 mg HS Continue buspirone 15 BID helped anxiety.  Follow-up  3-4  mos  Meredith Staggers MD, DFAPA  Please see After Visit Summary for patient specific instructions.  Future Appointments  Date Time Provider Department Center  09/12/2022  1:00 PM Haze Rushing, PhD [facility] None  09/26/2022  1:00 PM Haze Rushing, PhD [facility] None  10/19/2022  1:00 PM [facility] None  10/24/2022  1:00 PM Haze Rushing, PhD [facility] None    No orders of the defined types were placed in this encounter.      -------------------------------

## 2022-09-10 ENCOUNTER — Ambulatory Visit (INDEPENDENT_AMBULATORY_CARE_PROVIDER_SITE_OTHER): Payer: Medicare Other | Admitting: Psychology

## 2022-09-10 DIAGNOSIS — F3181 Bipolar II disorder: Secondary | ICD-10-CM

## 2022-09-10 DIAGNOSIS — F319 Bipolar disorder, unspecified: Secondary | ICD-10-CM

## 2022-09-10 NOTE — Progress Notes (Addendum)
Meredith [name] is a 72 y.o. female patient   Date: 09/10/2022  Treatment Plan: Diagnosis F31.81 (Bipolar II disorder) [n/a]  Symptoms Depressed or irritable mood. (Status: maintained) -- No Description Entered  Lack of energy. (Status: maintained) -- No Description Entered  Social withdrawal. (Status: maintained) -- No Description Entered  Medication Status compliance  Safety none  If Suicidal or Homicidal State Action Taken: unspecified  Current Risk: low Medications Abilify (Dosage: 30mg )  Hydocodone (Dosage: 7.5mg )  Lamotrigine (Dosage: 200mg /day)  Lithium (Dosage: 450mg /day)  Seroquel (Dosage: 200mg /day)  Objectives Related Problem: Develop healthy cognitive patterns and beliefs about self and the world that lead to alleviation and help prevent the relapse of mood episodes. Description: Verbalize grief, fear, and anger regarding real or imagined losses in life. Target Date: 2023-02-09 Frequency: Daily Modality: individual Progress: 80%  Related Problem: Develop healthy cognitive patterns and beliefs about self and the world that lead to alleviation and help prevent the relapse of mood episodes. Description: Take prescribed medications as directed. Target Date: 2022-02-08 Frequency: Daily Modality: individual Progress: 100%  Related Problem: Develop healthy cognitive patterns and beliefs about self and the world that lead to alleviation and help prevent the relapse of mood episodes. Description: Describe mood state, energy level, amount of control over thoughts, and sleeping pattern. Target Date: 2023-02-09 Frequency: Daily Modality: individual Progress: 75%  Client Response full compliance  Service Location Location, [address] [place], [place] [address]  Service Code cpt [id number] P Neuropsych. testing  Related past to present  Self-monitoring  Self care activities  Emotion regulation  skills  Validate/empathize  Facilitate problem solving  Normalize/Reframe  Journaling  Comments  Dx.: F31.81  Goals: States she is seeking emotional stability. She needs guidance in managing some of her physical/medical challenges and limitations. Also, have some interpersonal challenges and seeks feedback on addressing these difficulties.Revised target date is 12-24.  Meds: Seroquel (300mg  daily), Hydrocodone (7.5 mg up to 3x/day), Buspar (10mg  twice daily).  Patient agrees to be seen for a video Public affairs consultant) appointment and understands the limitations of this platform. She was at home and provider was in his office.  Meredith [name] says she is having difficulty tolerating the heat. Sees neurologist for injections for her migraines. She saw Dr. Jennelle Human las week and all medicines remain intact for another 3 months. She talked about her mood states which have been relatively stable. She spent time talking about her marriage and her husband's PTSD. It was a major problem in their marriage and she feels it led to their divorce. She has long accepted this reality, but periodically revisits the pain/hurt of that time. We discussed remaining focused on her current life and future in order to put her physical and emotional energy into those things that contribute to her happiness.                                                              [name]  [name], PhD Time: 1:10p-2:00p 50 minutes

## 2022-09-11 DIAGNOSIS — G518 Other disorders of facial nerve: Secondary | ICD-10-CM | POA: Diagnosis not present

## 2022-09-11 DIAGNOSIS — M791 Myalgia, unspecified site: Secondary | ICD-10-CM | POA: Diagnosis not present

## 2022-09-11 DIAGNOSIS — M542 Cervicalgia: Secondary | ICD-10-CM | POA: Diagnosis not present

## 2022-09-11 DIAGNOSIS — G43719 Chronic migraine without aura, intractable, without status migrainosus: Secondary | ICD-10-CM | POA: Diagnosis not present

## 2022-09-12 ENCOUNTER — Ambulatory Visit: Payer: Medicare Other | Admitting: Psychology

## 2022-09-18 DIAGNOSIS — M47816 Spondylosis without myelopathy or radiculopathy, lumbar region: Secondary | ICD-10-CM | POA: Diagnosis not present

## 2022-09-18 DIAGNOSIS — Z79891 Long term (current) use of opiate analgesic: Secondary | ICD-10-CM | POA: Diagnosis not present

## 2022-09-18 DIAGNOSIS — M545 Low back pain, unspecified: Secondary | ICD-10-CM | POA: Diagnosis not present

## 2022-09-18 DIAGNOSIS — M25562 Pain in left knee: Secondary | ICD-10-CM | POA: Diagnosis not present

## 2022-09-18 DIAGNOSIS — M533 Sacrococcygeal disorders, not elsewhere classified: Secondary | ICD-10-CM | POA: Diagnosis not present

## 2022-09-18 DIAGNOSIS — G894 Chronic pain syndrome: Secondary | ICD-10-CM | POA: Diagnosis not present

## 2022-09-19 DIAGNOSIS — M1712 Unilateral primary osteoarthritis, left knee: Secondary | ICD-10-CM | POA: Diagnosis not present

## 2022-09-26 ENCOUNTER — Ambulatory Visit (INDEPENDENT_AMBULATORY_CARE_PROVIDER_SITE_OTHER): Payer: Medicare Other | Admitting: Psychology

## 2022-09-26 DIAGNOSIS — F319 Bipolar disorder, unspecified: Secondary | ICD-10-CM

## 2022-09-26 DIAGNOSIS — F3181 Bipolar II disorder: Secondary | ICD-10-CM | POA: Diagnosis not present

## 2022-09-26 NOTE — Progress Notes (Signed)
Meredith [name] is a 72 y.o. female patient   Date: 09/26/2022  Treatment Plan: Diagnosis F31.81 (Bipolar II disorder) [n/a]  Symptoms Depressed or irritable mood. (Status: maintained) -- No Description Entered  Lack of energy. (Status: maintained) -- No Description Entered  Social withdrawal. (Status: maintained) -- No Description Entered  Medication Status compliance  Safety none  If Suicidal or Homicidal State Action Taken: unspecified  Current Risk: low Medications Abilify (Dosage: 30mg )  Hydocodone (Dosage: 7.5mg )  Lamotrigine (Dosage: 200mg /day)  Lithium (Dosage: 450mg /day)  Seroquel (Dosage: 200mg /day)  Objectives Related Problem: Develop healthy cognitive patterns and beliefs about self and the world that lead to alleviation and help prevent the relapse of mood episodes. Description: Verbalize grief, fear, and anger regarding real or imagined losses in life. Target Date: 2023-02-09 Frequency: Daily Modality: individual Progress: 80%  Related Problem: Develop healthy cognitive patterns and beliefs about self and the world that lead to alleviation and help prevent the relapse of mood episodes. Description: Take prescribed medications as directed. Target Date: 2022-02-08 Frequency: Daily Modality: individual Progress: 100%  Related Problem: Develop healthy cognitive patterns and beliefs about self and the world that lead to alleviation and help prevent the relapse of mood episodes. Description: Describe mood state, energy level, amount of control over thoughts, and sleeping pattern. Target Date: 2023-02-09 Frequency: Daily Modality: individual Progress: 75%  Client Response full compliance  Service Location Location, [address] [place], [place] [address]  Service Code cpt [id number] P Neuropsych. testing  Related past to present  Self-monitoring  Self care  activities  Emotion regulation skills  Validate/empathize  Facilitate problem solving  Normalize/Reframe  Journaling  Comments  Dx.: F31.81  Goals: States she is seeking emotional stability. She needs guidance in managing some of her physical/medical challenges and limitations. Also, have some interpersonal challenges and seeks feedback on addressing these difficulties.Revised target date is 12-24.  Meds: Seroquel (300mg  daily), Hydrocodone (7.5 mg up to 3x/day), Buspar (10mg  twice daily).  Patient agrees to be seen for a virtual Public affairs consultant) appointment and understands the limitations of this platform. She was at home and provider was in his office.  Meredith [name] is having internet problems and it delayed session. Saw the orthopedic doctor and got new meds that are helping her pain. She reports feeling very frustrated with the current political climate and it creates agitation for her. We talked about taking care of herself and limiting her exposure to news that frustrates or angers her. She says she has been a "little down lately", which is likely related to being "housebound" due to her knee pain. Will strive to get get out more to interact with others, especially while brother is out of town.                                                              [name]  [name], PhD Time: 1:15p-2:00p 45 minutes

## 2022-10-02 ENCOUNTER — Other Ambulatory Visit: Payer: Self-pay | Admitting: Family Medicine

## 2022-10-02 DIAGNOSIS — Z1231 Encounter for screening mammogram for malignant neoplasm of breast: Secondary | ICD-10-CM

## 2022-10-08 DIAGNOSIS — M791 Myalgia, unspecified site: Secondary | ICD-10-CM | POA: Diagnosis not present

## 2022-10-08 DIAGNOSIS — G43719 Chronic migraine without aura, intractable, without status migrainosus: Secondary | ICD-10-CM | POA: Diagnosis not present

## 2022-10-08 DIAGNOSIS — G518 Other disorders of facial nerve: Secondary | ICD-10-CM | POA: Diagnosis not present

## 2022-10-08 DIAGNOSIS — M542 Cervicalgia: Secondary | ICD-10-CM | POA: Diagnosis not present

## 2022-10-16 ENCOUNTER — Ambulatory Visit (INDEPENDENT_AMBULATORY_CARE_PROVIDER_SITE_OTHER): Payer: Medicare Other | Admitting: Psychology

## 2022-10-16 DIAGNOSIS — F3181 Bipolar II disorder: Secondary | ICD-10-CM

## 2022-10-16 DIAGNOSIS — F319 Bipolar disorder, unspecified: Secondary | ICD-10-CM

## 2022-10-16 NOTE — Progress Notes (Addendum)
    Meredith [name] is a 72 y.o. female patient   Date: 10/16/2022  Treatment Plan: Diagnosis F31.81 (Bipolar II disorder) [n/a]  Symptoms Depressed or irritable mood. (Status: maintained) -- No Description Entered  Lack of energy. (Status: maintained) -- No Description Entered  Social withdrawal. (Status: maintained) -- No Description Entered  Medication Status compliance  Safety none  If Suicidal or Homicidal State Action Taken: unspecified  Current Risk: low Medications Abilify (Dosage: 30mg )  Hydocodone (Dosage: 7.5mg )  Lamotrigine (Dosage: 200mg /day)  Lithium (Dosage: 450mg /day)  Seroquel (Dosage: 200mg /day)  Objectives Related Problem: Develop healthy cognitive patterns and beliefs about self and the world that lead to alleviation and help prevent the relapse of mood episodes. Description: Verbalize grief, fear, and anger regarding real or imagined losses in life. Target Date: 2023-02-09 Frequency: Daily Modality: individual Progress: 80%  Related Problem: Develop healthy cognitive patterns and beliefs about self and the world that lead to alleviation and help prevent the relapse of mood episodes. Description: Take prescribed medications as directed. Target Date: 2022-02-08 Frequency: Daily Modality: individual Progress: 100%  Related Problem: Develop healthy cognitive patterns and beliefs about self and the world that lead to alleviation and help prevent the relapse of mood episodes. Description: Describe mood state, energy level, amount of control over thoughts, and sleeping pattern. Target Date: 2023-02-09 Frequency: Daily Modality: individual Progress: 75%  Client Response full compliance  Service Location Location, [address] [place], [place] [address]  Service Code cpt [phone] P Neuropsych. testing  Related past to present  Self-monitoring  Self care activities  Emotion regulation skills  Validate/empathize  Facilitate problem solving   Normalize/Reframe  Journaling  Comments  Dx.: F31.81  Goals: States she is seeking emotional stability. She needs guidance in managing some of her physical/medical challenges and limitations. Also, have some interpersonal challenges and seeks feedback on addressing these difficulties.Revised target date is 12-24.  Meds: Seroquel (300mg  daily), Hydrocodone (7.5 mg up to 3x/day), Buspar (10mg  twice daily).  Patient agrees to be seen for a virtual Public affairs consultant) appointment and understands the limitations of this platform. She was at home and provider was in his office.  Meredith [name] talked about her physical challenges. The injection helped her knee for a month, but she is now back using her cane. The pain is less than before. She says she is going to stop taking Trazodone because it makes her "tired and sluggish" in the morning, even when getting up at 11:00. She talked about her anxiety regarding the upcoming election. She says she has fears about what is going to happen to the country. Has not been very active and we discussed options to keep her more engaged and less lonely.                                                                  [name]                                                  [name], PhD Time: 2:10p-3:00p 50 minutes

## 2022-10-18 DIAGNOSIS — G43009 Migraine without aura, not intractable, without status migrainosus: Secondary | ICD-10-CM | POA: Diagnosis not present

## 2022-10-18 DIAGNOSIS — I480 Paroxysmal atrial fibrillation: Secondary | ICD-10-CM | POA: Diagnosis not present

## 2022-10-18 DIAGNOSIS — E7800 Pure hypercholesterolemia, unspecified: Secondary | ICD-10-CM | POA: Diagnosis not present

## 2022-10-18 DIAGNOSIS — I700 Atherosclerosis of aorta: Secondary | ICD-10-CM | POA: Diagnosis not present

## 2022-10-18 DIAGNOSIS — N183 Chronic kidney disease, stage 3 unspecified: Secondary | ICD-10-CM | POA: Diagnosis not present

## 2022-10-19 ENCOUNTER — Ambulatory Visit: Payer: Medicare Other | Attending: Cardiovascular Disease

## 2022-10-19 DIAGNOSIS — I480 Paroxysmal atrial fibrillation: Secondary | ICD-10-CM

## 2022-10-19 DIAGNOSIS — Z5181 Encounter for therapeutic drug level monitoring: Secondary | ICD-10-CM | POA: Diagnosis not present

## 2022-10-19 LAB — POCT INR: INR: 3.6 — AB (ref 2.0–3.0)

## 2022-10-19 NOTE — Patient Instructions (Signed)
HOLD TOMORROW ONLY THEN Continue taking warfarin 1 tablet daily except 1.5 tablets on Tuesday. Recheck INR in 4 weeks. Call with any medication changes or if you are scheduled for surgery [facility] [phone] Main [phone]

## 2022-10-23 DIAGNOSIS — M533 Sacrococcygeal disorders, not elsewhere classified: Secondary | ICD-10-CM | POA: Diagnosis not present

## 2022-10-23 DIAGNOSIS — M47816 Spondylosis without myelopathy or radiculopathy, lumbar region: Secondary | ICD-10-CM | POA: Diagnosis not present

## 2022-10-23 DIAGNOSIS — G894 Chronic pain syndrome: Secondary | ICD-10-CM | POA: Diagnosis not present

## 2022-10-23 DIAGNOSIS — M545 Low back pain, unspecified: Secondary | ICD-10-CM | POA: Diagnosis not present

## 2022-10-23 DIAGNOSIS — M25562 Pain in left knee: Secondary | ICD-10-CM | POA: Diagnosis not present

## 2022-10-23 DIAGNOSIS — Z79891 Long term (current) use of opiate analgesic: Secondary | ICD-10-CM | POA: Diagnosis not present

## 2022-10-24 ENCOUNTER — Ambulatory Visit (INDEPENDENT_AMBULATORY_CARE_PROVIDER_SITE_OTHER): Payer: Medicare Other | Admitting: Psychology

## 2022-10-24 DIAGNOSIS — F3181 Bipolar II disorder: Secondary | ICD-10-CM

## 2022-10-24 DIAGNOSIS — F319 Bipolar disorder, unspecified: Secondary | ICD-10-CM

## 2022-10-24 NOTE — Progress Notes (Addendum)
                   Meredith [name] is a 72 y.o. female patient   Date: 10/24/2022  Treatment Plan: Diagnosis F31.81 (Bipolar II disorder) [n/a]  Symptoms Depressed or irritable mood. (Status: maintained) -- No Description Entered  Lack of energy. (Status: maintained) -- No Description Entered  Social withdrawal. (Status: maintained) -- No Description Entered  Medication Status compliance  Safety none  If Suicidal or Homicidal State Action Taken: unspecified  Current Risk: low Medications Abilify (Dosage: 30mg )  Hydocodone (Dosage: 7.5mg )  Lamotrigine (Dosage: 200mg /day)  Lithium (Dosage: 450mg /day)  Seroquel (Dosage: 200mg /day)  Objectives Related Problem: Develop healthy cognitive patterns and beliefs about self and the world that lead to alleviation and help prevent the relapse of mood episodes. Description: Verbalize grief, fear, and anger regarding real or imagined losses in life. Target Date: 2023-02-09 Frequency: Daily Modality: individual Progress: 80%  Related Problem: Develop healthy cognitive patterns and beliefs about self and the world that lead to alleviation and help prevent the relapse of mood episodes. Description: Take prescribed medications as directed. Target Date: 2022-02-08 Frequency: Daily Modality: individual Progress: 100%  Related Problem: Develop healthy cognitive patterns and beliefs about self and the world that lead to alleviation and help prevent the relapse of mood episodes. Description: Describe mood state, energy level, amount of control over thoughts, and sleeping pattern. Target Date: 2023-02-09 Frequency: Daily Modality: individual Progress: 75%  Client Response full compliance  Service Location Location, [address] [place], [place] [address]  Service Code cpt [phone] P Neuropsych. testing  Related past to present  Self-monitoring  Self care activities  Emotion regulation skills  Validate/empathize   Facilitate problem solving  Normalize/Reframe  Journaling  Comments  Dx.: F31.81  Goals: States she is seeking emotional stability. She needs guidance in managing some of her physical/medical challenges and limitations. Also, have some interpersonal challenges and seeks feedback on addressing these difficulties.Revised target date is 12-24.  Meds: Seroquel (300mg  daily), Hydrocodone (7.5 mg up to 3x/day), Buspar (10mg  twice daily).   Patient agrees to be seen for a video Public affairs consultant) appointment and understands the limitations of this platform. She was at home and provider was in his office.  Veeda says she is doing okay, but still in knee pain. She had to skip church because she could not walk on her knee. She went to the pain doctor and she recommended some specific treatments from an orthopedic physician. She is feeling discouraged that she is back in pain even after getting a steroid shot. She wants to explore all options but is having trouble remaining positive. We talked about needing to keep a positive attitude and focus on more positive thoughts. Incorporate guided imagery.                                                                   [name]                                                  [name], PhD Time: 1:10p-2:00p 50 minutes

## 2022-10-25 DIAGNOSIS — N183 Chronic kidney disease, stage 3 unspecified: Secondary | ICD-10-CM | POA: Diagnosis not present

## 2022-10-25 DIAGNOSIS — E039 Hypothyroidism, unspecified: Secondary | ICD-10-CM | POA: Diagnosis not present

## 2022-10-25 DIAGNOSIS — K219 Gastro-esophageal reflux disease without esophagitis: Secondary | ICD-10-CM | POA: Diagnosis not present

## 2022-10-25 DIAGNOSIS — M8580 Other specified disorders of bone density and structure, unspecified site: Secondary | ICD-10-CM | POA: Diagnosis not present

## 2022-11-05 DIAGNOSIS — G43719 Chronic migraine without aura, intractable, without status migrainosus: Secondary | ICD-10-CM | POA: Diagnosis not present

## 2022-11-05 DIAGNOSIS — G518 Other disorders of facial nerve: Secondary | ICD-10-CM | POA: Diagnosis not present

## 2022-11-05 DIAGNOSIS — M542 Cervicalgia: Secondary | ICD-10-CM | POA: Diagnosis not present

## 2022-11-05 DIAGNOSIS — M791 Myalgia, unspecified site: Secondary | ICD-10-CM | POA: Diagnosis not present

## 2022-11-06 ENCOUNTER — Ambulatory Visit
Admission: RE | Admit: 2022-11-06 | Discharge: 2022-11-06 | Disposition: A | Discharge status: Home / Self Care | Payer: Medicare Other | Source: Ambulatory Visit | Attending: Family Medicine | Admitting: Family Medicine

## 2022-11-06 DIAGNOSIS — Z1231 Encounter for screening mammogram for malignant neoplasm of breast: Secondary | ICD-10-CM | POA: Diagnosis not present

## 2022-11-07 ENCOUNTER — Ambulatory Visit: Payer: Medicare Other | Admitting: Psychology

## 2022-11-07 DIAGNOSIS — F3181 Bipolar II disorder: Secondary | ICD-10-CM

## 2022-11-07 DIAGNOSIS — F319 Bipolar disorder, unspecified: Secondary | ICD-10-CM

## 2022-11-07 NOTE — Progress Notes (Addendum)
Meredith [name] is a 72 y.o. female patient   Date: 11/07/2022  Treatment Plan: Diagnosis F31.81 (Bipolar II disorder) [n/a]  Symptoms Depressed or irritable mood. (Status: maintained) -- No Description Entered  Lack of energy. (Status: maintained) -- No Description Entered  Social withdrawal. (Status: maintained) -- No Description Entered  Medication Status compliance  Safety none  If Suicidal or Homicidal State Action Taken: unspecified  Current Risk: low Medications Abilify (Dosage: 30mg )  Hydocodone (Dosage: 7.5mg )  Lamotrigine (Dosage: 200mg /day)  Lithium (Dosage: 450mg /day)  Seroquel (Dosage: 200mg /day)  Objectives Related Problem: Develop healthy cognitive patterns and beliefs about self and the world that lead to alleviation and help prevent the relapse of mood episodes. Description: Verbalize grief, fear, and anger regarding real or imagined losses in life. Target Date: 2023-02-09 Frequency: Daily Modality: individual Progress: 80%  Related Problem: Develop healthy cognitive patterns and beliefs about self and the world that lead to alleviation and help prevent the relapse of mood episodes. Description: Take prescribed medications as directed. Target Date: 2022-02-08 Frequency: Daily Modality: individual Progress: 100%  Related Problem: Develop healthy cognitive patterns and beliefs about self and the world that lead to alleviation and help prevent the relapse of mood episodes. Description: Describe mood state, energy level, amount of control over thoughts, and sleeping pattern. Target Date: 2023-02-09 Frequency: Daily Modality: individual Progress: 75%  Client Response full compliance  Service Location Location, [address] [place], [place] [address]  Service Code cpt [phone] P Neuropsych. testing  Related past to present  Self-monitoring  Self care activities  Emotion regulation  skills  Validate/empathize  Facilitate problem solving  Normalize/Reframe  Journaling  Comments  Dx.: F31.81  Goals: States she is seeking emotional stability. She needs guidance in managing some of her physical/medical challenges and limitations. Also, have some interpersonal challenges and seeks feedback on addressing these difficulties.Revised target date is 12-24.  Meds: Seroquel (300mg  daily), Hydrocodone (7.5 mg up to 3x/day), Buspar (10mg  twice daily).   Patient agrees to be seen for a video(Caregility) appointment and understands the limitations of this platform. She was at home and provider was in his office.  Meredith [name] says she is doing "okay", but says she is still in physical pain (knee). She is upset that her orthopedic doctor prescribed Voltarin and Myloxicam when she is taking a blood thinner and has abnormal kidney labs. States that she is feeling a bit discouraged as her activity is limited. Feeling like she wants to change orthopedic doctors. She also expressed her distress about the current political situation and spent time sharing her upset. Suggested she take a break from media to keep herself feeling calm.                                                                      [name]  [name], PhD Time: 1:10p-2:00p 50 minutes

## 2022-11-16 ENCOUNTER — Ambulatory Visit: Payer: Medicare Other | Attending: Cardiovascular Disease

## 2022-11-16 DIAGNOSIS — I480 Paroxysmal atrial fibrillation: Secondary | ICD-10-CM | POA: Diagnosis not present

## 2022-11-16 DIAGNOSIS — Z5181 Encounter for therapeutic drug level monitoring: Secondary | ICD-10-CM | POA: Diagnosis not present

## 2022-11-16 LAB — POCT INR: INR: 3.3 — AB (ref 2.0–3.0)

## 2022-11-16 NOTE — Patient Instructions (Signed)
HOLD TOMORROW ONLY THEN Continue taking warfarin 1 tablet daily except 1.5 tablets on Tuesday. Recheck INR in 4 weeks. Call with any medication changes or if you are scheduled for surgery [facility] [phone] Main [phone]

## 2022-11-21 ENCOUNTER — Ambulatory Visit (INDEPENDENT_AMBULATORY_CARE_PROVIDER_SITE_OTHER): Payer: Medicare Other | Admitting: Psychology

## 2022-11-21 DIAGNOSIS — F319 Bipolar disorder, unspecified: Secondary | ICD-10-CM
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
## 2022-11-22 DIAGNOSIS — M545 Low back pain, unspecified: Secondary | ICD-10-CM | POA: Diagnosis not present

## 2022-11-22 DIAGNOSIS — Z79891 Long term (current) use of opiate analgesic: Secondary | ICD-10-CM | POA: Diagnosis not present

## 2022-11-22 DIAGNOSIS — M47816 Spondylosis without myelopathy or radiculopathy, lumbar region: Secondary | ICD-10-CM | POA: Diagnosis not present

## 2022-11-22 DIAGNOSIS — M533 Sacrococcygeal disorders, not elsewhere classified: Secondary | ICD-10-CM | POA: Diagnosis not present

## 2022-11-22 DIAGNOSIS — G894 Chronic pain syndrome: Secondary | ICD-10-CM | POA: Diagnosis not present

## 2022-11-22 DIAGNOSIS — M25562 Pain in left knee: Secondary | ICD-10-CM | POA: Diagnosis not present

## 2022-11-26 DIAGNOSIS — Z23 Encounter for immunization: Secondary | ICD-10-CM | POA: Diagnosis not present

## 2022-11-29 DIAGNOSIS — M533 Sacrococcygeal disorders, not elsewhere classified: Secondary | ICD-10-CM | POA: Diagnosis not present

## 2022-12-03 DIAGNOSIS — G518 Other disorders of facial nerve: Secondary | ICD-10-CM | POA: Diagnosis not present

## 2022-12-03 DIAGNOSIS — M791 Myalgia, unspecified site: Secondary | ICD-10-CM | POA: Diagnosis not present

## 2022-12-03 DIAGNOSIS — M542 Cervicalgia: Secondary | ICD-10-CM | POA: Diagnosis not present

## 2022-12-03 DIAGNOSIS — G43719 Chronic migraine without aura, intractable, without status migrainosus: Secondary | ICD-10-CM | POA: Diagnosis not present

## 2022-12-05 ENCOUNTER — Ambulatory Visit (INDEPENDENT_AMBULATORY_CARE_PROVIDER_SITE_OTHER): Payer: Medicare Other | Admitting: Psychology

## 2022-12-05 DIAGNOSIS — F319 Bipolar disorder, unspecified: Secondary | ICD-10-CM

## 2022-12-05 DIAGNOSIS — F3181 Bipolar II disorder: Secondary | ICD-10-CM
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
## 2022-12-06 ENCOUNTER — Ambulatory Visit: Payer: Medicare Other | Admitting: Psychiatry

## 2022-12-11 ENCOUNTER — Ambulatory Visit: Payer: Medicare Other | Admitting: Psychiatry

## 2022-12-11 ENCOUNTER — Encounter: Payer: Self-pay | Admitting: Psychiatry

## 2022-12-11 DIAGNOSIS — F5105 Insomnia due to other mental disorder: Secondary | ICD-10-CM

## 2022-12-11 DIAGNOSIS — F411 Generalized anxiety disorder: Secondary | ICD-10-CM | POA: Diagnosis not present

## 2022-12-11 DIAGNOSIS — F4001 Agoraphobia with panic disorder: Secondary | ICD-10-CM

## 2022-12-11 DIAGNOSIS — Z8669 Personal history of other diseases of the nervous system and sense organs: Secondary | ICD-10-CM | POA: Diagnosis not present

## 2022-12-11 DIAGNOSIS — F319 Bipolar disorder, unspecified: Secondary | ICD-10-CM | POA: Diagnosis not present

## 2022-12-11 DIAGNOSIS — G3184 Mild cognitive impairment, so stated: Secondary | ICD-10-CM | POA: Diagnosis not present

## 2022-12-11 DIAGNOSIS — F4310 Post-traumatic stress disorder, unspecified: Secondary | ICD-10-CM

## 2022-12-11 NOTE — Progress Notes (Signed)
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    Drug abuse Brother    Hypertension Brother    Mental illness Brother     Social History   Socioeconomic History   Marital status: Single    Spouse name: Not on file   Number of children: 3   Years of education: Not on file   Highest education level: Not on file  Occupational History   Occupation: Retired  Tobacco Use   Smoking status: Never   Smokeless tobacco: Never  Vaping Use   Vaping status: Not on file  Substance and Sexual Activity   Alcohol use: Not Currently   Drug use: No   Sexual activity: Not Currently  Other Topics Concern   Not on file  Social History Narrative   Not on file   Social Determinants of Health   Financial Resource [name]: Not on file  Food Insecurity: Not on file  Transportation Needs: Not on file  Physical Activity: Not on file  Stress: Not on file  Social Connections: Not on file  Intimate Partner Violence: Not on file    Past Medical History, Surgical history, Social history, and Family history were reviewed and updated as appropriate.   ADOPTED but has  twin brother.  Please see review of systems for further details on the patient's review from today.   Objective:   Physical Exam:  LMP  (LMP Unknown)   Physical Exam Constitutional:      General: She is not in acute distress.    Appearance: She is well-developed. She is obese.  Musculoskeletal:        General: Signs of injury present. No deformity.  Neurological:     Mental Status: She is alert and oriented to person, place, and time.     Motor: No weakness.     Coordination: Coordination normal.     Gait: Gait abnormal.     Comments: No cane and gate quick  Psychiatric:        Attention and Perception: Perception normal.  She does not perceive auditory or visual hallucinations.        Mood and Affect: Mood is anxious. Mood is not depressed. Affect is not labile, blunt, angry, tearful or inappropriate.        Speech: Speech normal. Speech is not rapid and pressured or slurred.        Behavior: Behavior normal.        Thought Content: Thought content normal. Thought content is not delusional. Thought content does not include homicidal or suicidal ideation. Thought content does not include suicidal plan.        Cognition and Memory: Cognition is not impaired. She exhibits impaired recent memory.     Comments: Insight and judgment fair No delusions.  Cognition appears baseline but she feels it's impaired Neat and pleasant Talkative without pressure. Somatic focus.     Lab Review:     Component Value Date/Time   NA 140 12/12/2020 1548   NA 141 12/07/2019 1022   K 4.5 12/12/2020 1548   CL 107 12/12/2020 1548   CO2 26 12/12/2020 1548   GLUCOSE 94 12/12/2020 1548   BUN 14 12/12/2020 1548   BUN 15 12/07/2019 1022   CREATININE 0.99 12/12/2020 1548   CREATININE 1.02 (H) 11/11/2020 1116   CALCIUM 9.5 12/12/2020 1548   PROT 7.1 08/16/2020 1451   ALBUMIN 4.5 08/16/2020 1451   AST 14 (L) 08/16/2020 1451   ALT 10 08/16/2020 1451   ALKPHOS 56 08/16/2020 1451   BILITOT 0.8  08/16/2020 1451   GFRNONAA >60 12/12/2020 1548   GFRNONAA 61 09/02/2020 1552   GFRAA 71 09/02/2020 1552       Component Value Date/Time   WBC 5.5 03/13/2021 1414   WBC 5.7 11/11/2020 1116   RBC 4.60 03/13/2021 1414   RBC 4.45 11/11/2020 1116   HGB 13.9 03/13/2021 1414   HCT 40.0 03/13/2021 1414   PLT 193 03/13/2021 1414   MCV 87 03/13/2021 1414   MCH 30.2 03/13/2021 1414   MCH 29.7 11/11/2020 1116   MCHC 34.8 03/13/2021 1414   MCHC 32.8 11/11/2020 1116   RDW 15.0 03/13/2021 1414   LYMPHSABS 1,448 11/11/2020 1116   MONOABS 0.9 07/15/2019 1150   EOSABS 479 11/11/2020 1116   BASOSABS 63 11/11/2020 1116    Lithium Lvl  Date Value Ref Range Status  08/18/2020 0.17 (L) 0.60 - 1.20 mmol/L Final    Comment:    Performed at [facility], [address] [place], [place] [address]    May 20, 2019 lithium level 0.9 on 450 mg daily and creatinine was 1.1 with normal calcium 07/21/19 lithium 0.4 on 450 mg daily, normal CMP  No results found for: "PHENYTOIN", "PHENOBARB", "VALPROATE", "CBMZ"   .res Assessment: Plan:     [place]ah was seen today for follow-up, depression and anxiety.  Diagnoses and all orders for this visit:  Bipolar affective disorder, rapid cycling (HCC)  Generalized anxiety disorder  Panic disorder with agoraphobia  PTSD (post-traumatic stress disorder)  Mild cognitive impairment  Insomnia due to mental condition  History of encephalopathy   History of lithium toxicity Hx repeated bouts of encephalopathy  30 min face to face time with patient was spent on counseling and coordination of care. We discussed the following.  Madalena is a chronically mentally ill patient with chronic depression and chronic anxiety and multiple med failures.  Overall is more stable psychiatrically on lower-dose quetiapine than in the past  .  She is satisfied and doesn't want  changes.  [facility] hosp for acute encephalopathy 07/2020.  The cause was unknown but presumed to  be medication related.   Cognition appears stable in recent months.  Using pillbox to help compliance.  Disc danger of mixing up or doubling up meds.  She fills box herself.  Rec she get help with this. Cannot afford branded meds which prevents [place] from using some of the bipolar depression meds like Vraylar.  Few options for depression and anxiety, Option Trileptal not as good for depression. therefore increased Seroquel to 300 mg HS  has doing ok with this so far. Caution re: sedative risks emphasized and fall risk.   Afraid of going lower in Seroquel bc fear of relapse as it is the main mood stabilizer.    Push fluids.  Has to remind herself. Emphasized again.  Says she's doing well with this.   Discussed potential metabolic side effects associated with atypical antipsychotics, as well as potential risk for movement side effects. Advised pt to contact office if movement side effects occur.   OK Ativan prn for dental procedure 1-2 mg.  No driving after it.  PCP Monia Pouch, Meridee Score  Continue therapy with Dr. Dellia Cloud q 2 weeks. Consider better medication coverage when gets new insurance Discussed this issue with her again although I think she again shows a cheaper plan which will limit medication options. Supportive therapy dealing with ill friends and chronic health issues   No med changes indicated.  Current psych meds Seroquel 300 HS are preventing mood cycling and helping depression Continue trazodone 50 mg HS Continue buspirone 15 BID helped anxiety.  Follow-up  3-4  mos  Meredith Staggers MD, DFAPA  Please see After Visit Summary for patient specific instructions.  Future Appointments  Date Time Provider Department Center  12/14/2022  1:00 PM [facility] None  12/19/2022  1:00 PM Haze Rushing, PhD [facility] None  01/02/2023  1:00 PM Haze Rushing, PhD [facility] None  01/16/2023  1:00 PM Haze Rushing, PhD [facility] None   01/28/2023  1:30 PM Cottle, Steva Ready., MD [facility] None  01/30/2023  1:00 PM Haze Rushing, PhD [facility] None  02/13/2023  1:00 PM Haze Rushing, PhD [facility] None  03/13/2023  1:00 PM Haze Rushing, PhD [facility] None  03/15/2023  2:00 PM Christell Constant, MD [facility]  03/27/2023  1:00 PM Haze Rushing, PhD [facility] None  04/10/2023  1:00 PM Haze Rushing, PhD [facility] None  04/24/2023  1:00 PM Haze Rushing, PhD [facility] None  05/08/2023  1:00 PM Haze Rushing, PhD [facility] None  05/22/2023  1:00 PM Haze Rushing, PhD [facility] None  06/05/2023  1:00 PM Haze Rushing, PhD [facility] None    No orders of the defined types were placed in this encounter.      -------------------------------

## 2022-12-13 DIAGNOSIS — M25552 Pain in left hip: Secondary | ICD-10-CM | POA: Diagnosis not present

## 2022-12-13 DIAGNOSIS — Z6837 Body mass index (BMI) 37.0-37.9, adult: Secondary | ICD-10-CM | POA: Diagnosis not present

## 2022-12-14 ENCOUNTER — Ambulatory Visit: Payer: Medicare Other | Attending: Cardiovascular Disease

## 2022-12-14 DIAGNOSIS — Z5181 Encounter for therapeutic drug level monitoring: Secondary | ICD-10-CM | POA: Diagnosis present

## 2022-12-14 DIAGNOSIS — I480 Paroxysmal atrial fibrillation: Secondary | ICD-10-CM

## 2022-12-14 LAB — POCT INR: INR: 3 (ref 2.0–3.0)

## 2022-12-14 NOTE — Patient Instructions (Signed)
HOLD TOMORROW ONLY THEN TAKE 1 on SUNDAY, 0.5 TABLET MONDAY THEN CONTINUE 1 TABLET DAILY, EXCEPT 1.5 TABLETS ON TUESDAY.  EAT GREENS WHILE ON PREDNISONE. Recheck INR in 2 weeks. Call with any medication changes or if you are scheduled for surgery [facility] [phone] Main [phone]; Prednisone Taper starting 10/18

## 2022-12-19 ENCOUNTER — Ambulatory Visit (INDEPENDENT_AMBULATORY_CARE_PROVIDER_SITE_OTHER): Payer: Medicare Other | Admitting: Psychology

## 2022-12-19 DIAGNOSIS — F3181 Bipolar II disorder: Secondary | ICD-10-CM | POA: Diagnosis not present

## 2022-12-19 DIAGNOSIS — F319 Bipolar disorder, unspecified: Secondary | ICD-10-CM
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
## 2022-12-20 DIAGNOSIS — M25562 Pain in left knee: Secondary | ICD-10-CM | POA: Diagnosis not present

## 2022-12-20 DIAGNOSIS — G894 Chronic pain syndrome: Secondary | ICD-10-CM | POA: Diagnosis not present

## 2022-12-20 DIAGNOSIS — M47816 Spondylosis without myelopathy or radiculopathy, lumbar region: Secondary | ICD-10-CM | POA: Diagnosis not present

## 2022-12-20 DIAGNOSIS — M545 Low back pain, unspecified: Secondary | ICD-10-CM | POA: Diagnosis not present

## 2022-12-20 DIAGNOSIS — Z79891 Long term (current) use of opiate analgesic: Secondary | ICD-10-CM | POA: Diagnosis not present

## 2022-12-20 DIAGNOSIS — M533 Sacrococcygeal disorders, not elsewhere classified: Secondary | ICD-10-CM | POA: Diagnosis not present

## 2022-12-26 DIAGNOSIS — M791 Myalgia, unspecified site: Secondary | ICD-10-CM | POA: Diagnosis not present

## 2022-12-26 DIAGNOSIS — M542 Cervicalgia: Secondary | ICD-10-CM | POA: Diagnosis not present

## 2022-12-26 DIAGNOSIS — G43719 Chronic migraine without aura, intractable, without status migrainosus: Secondary | ICD-10-CM | POA: Diagnosis not present

## 2022-12-26 DIAGNOSIS — G518 Other disorders of facial nerve: Secondary | ICD-10-CM | POA: Diagnosis not present

## 2022-12-28 ENCOUNTER — Ambulatory Visit: Payer: Medicare Other | Attending: Cardiovascular Disease

## 2022-12-28 DIAGNOSIS — Z5181 Encounter for therapeutic drug level monitoring: Secondary | ICD-10-CM | POA: Diagnosis not present

## 2022-12-28 DIAGNOSIS — I480 Paroxysmal atrial fibrillation: Secondary | ICD-10-CM | POA: Insufficient documentation

## 2022-12-28 LAB — POCT INR: INR: 1.9 — AB (ref 2.0–3.0)

## 2022-12-28 NOTE — Patient Instructions (Signed)
Description   Take an extra 1/2 tablet today and then CONTINUE 1 TABLET DAILY, EXCEPT 1.5 TABLETS ON TUESDAY.   Recheck INR in 3 weeks.  Call with any medication changes or if you are scheduled for surgery  [facility] [phone] Main [phone]

## 2023-01-02 ENCOUNTER — Ambulatory Visit: Payer: Medicare Other | Admitting: Psychology

## 2023-01-02 DIAGNOSIS — F319 Bipolar disorder, unspecified: Secondary | ICD-10-CM

## 2023-01-02 DIAGNOSIS — F3181 Bipolar II disorder: Secondary | ICD-10-CM

## 2023-01-02 NOTE — Progress Notes (Signed)
Meredith [name] is a 72 y.o. female patient   Date: 01/02/2023  Treatment Plan: Diagnosis F31.81 (Bipolar II disorder) [n/a]  Symptoms Depressed or irritable mood. (Status: maintained) -- No Description Entered  Lack of energy. (Status: maintained) -- No Description Entered  Social withdrawal. (Status: maintained) -- No Description Entered  Medication Status compliance  Safety none  If Suicidal or Homicidal State Action Taken: unspecified  Current Risk: low Medications Abilify (Dosage: 30mg )  Hydocodone (Dosage: 7.5mg )  Lamotrigine (Dosage: 200mg /day)  Lithium (Dosage: 450mg /day)  Seroquel (Dosage: 200mg /day)  Objectives Related Problem: Develop healthy cognitive patterns and beliefs about self and the world that lead to alleviation and help prevent the relapse of mood episodes. Description: Verbalize grief, fear, and anger regarding real or imagined losses in life. Target Date: 2023-02-09 Frequency: Daily Modality: individual Progress: 80%  Related Problem: Develop healthy cognitive patterns and beliefs about self and the world that lead to alleviation and help prevent the relapse of mood episodes. Description: Take prescribed medications as directed. Target Date: 2022-02-08 Frequency: Daily Modality: individual Progress: 100%  Related Problem: Develop healthy cognitive patterns and beliefs about self and the world that lead to alleviation and help prevent the relapse of mood episodes. Description: Describe mood state, energy level, amount of control over thoughts, and sleeping pattern. Target Date: 2023-02-09 Frequency: Daily Modality: individual Progress: 75%  Client Response full compliance  Service Location Location, [address] [place], [place] [address]  Service  Code cpt [phone] P Neuropsych. testing  Related past to present  Self-monitoring  Self care activities  Emotion regulation skills  Validate/empathize  Facilitate problem solving  Normalize/Reframe  Journaling  Comments  Dx.: F31.81  Goals: States she is seeking emotional stability. She needs guidance in managing some of her physical/medical challenges and limitations. Also, have some interpersonal challenges and seeks feedback on addressing these difficulties.Revised target date is 12-24.  Meds: Seroquel (300mg  daily), Hydrocodone (7.5 mg up to 3x/day), Buspar (10mg  twice daily).  Patient agrees to be seen for a video Public affairs consultant) appointment and understands the limitations of this platform. She was at home and provider was in his office.  Meredith [name] says she is having some physical pain (bursitis). She talked about her frustrations with the projects that need to happen at her home. She is thinking about her Thanksgiving holiday with her brother and his family. She prefers to stay home, but doesn't want to be alone. She is wanting to suggest that her brother invite his daughter to come here as an alternative. Meredith [name] states that she is mostly feeling stable. Reports fewer episodes of depression. Will record any experiences of either depression or mania.                                                                                     [name]  [name], PhD Time: 1:10p-2:00p 50 minutes

## 2023-01-03 DIAGNOSIS — M1712 Unilateral primary osteoarthritis, left knee: Secondary | ICD-10-CM | POA: Diagnosis not present

## 2023-01-15 DIAGNOSIS — M25562 Pain in left knee: Secondary | ICD-10-CM | POA: Diagnosis not present

## 2023-01-15 DIAGNOSIS — G894 Chronic pain syndrome: Secondary | ICD-10-CM | POA: Diagnosis not present

## 2023-01-15 DIAGNOSIS — M47816 Spondylosis without myelopathy or radiculopathy, lumbar region: Secondary | ICD-10-CM | POA: Diagnosis not present

## 2023-01-15 DIAGNOSIS — M533 Sacrococcygeal disorders, not elsewhere classified: Secondary | ICD-10-CM | POA: Diagnosis not present

## 2023-01-15 DIAGNOSIS — Z79891 Long term (current) use of opiate analgesic: Secondary | ICD-10-CM | POA: Diagnosis not present

## 2023-01-15 DIAGNOSIS — M545 Low back pain, unspecified: Secondary | ICD-10-CM | POA: Diagnosis not present

## 2023-01-16 ENCOUNTER — Ambulatory Visit: Payer: Medicare Other | Admitting: Psychology

## 2023-01-16 DIAGNOSIS — F3181 Bipolar II disorder: Secondary | ICD-10-CM

## 2023-01-16 DIAGNOSIS — F319 Bipolar disorder, unspecified: Secondary | ICD-10-CM

## 2023-01-16 NOTE — Progress Notes (Signed)
Meredith [name] is a 72 y.o. female patient   Date: 01/16/2023  Treatment Plan: Diagnosis F31.81 (Bipolar II disorder) [n/a]  Symptoms Depressed or irritable mood. (Status: maintained) -- No Description Entered  Lack of energy. (Status: maintained) -- No Description Entered  Social withdrawal. (Status: maintained) -- No Description Entered  Medication Status compliance  Safety none  If Suicidal or Homicidal State Action Taken: unspecified  Current Risk: low Medications Abilify (Dosage: 30mg )  Hydocodone (Dosage: 7.5mg )  Lamotrigine (Dosage: 200mg /day)  Lithium (Dosage: 450mg /day)  Seroquel (Dosage: 200mg /day)  Objectives Related Problem: Develop healthy cognitive patterns and beliefs about self and the world that lead to alleviation and help prevent the relapse of mood episodes. Description: Verbalize grief, fear, and anger regarding real or imagined losses in life. Target Date: 2023-02-09 Frequency: Daily Modality: individual Progress: 80%  Related Problem: Develop healthy cognitive patterns and beliefs about self and the world that lead to alleviation and help prevent the relapse of mood episodes. Description: Take prescribed medications as directed. Target Date: 2022-02-08 Frequency: Daily Modality: individual Progress: 100%  Related Problem: Develop healthy cognitive patterns and beliefs about self and the world that lead to alleviation and help prevent the relapse of mood episodes. Description: Describe mood state, energy level, amount of control over thoughts, and sleeping pattern. Target Date: 2023-02-09 Frequency: Daily Modality: individual Progress: 75%  Client Response full compliance  Service Location Location, [address]  [place], [place] [address]  Service Code cpt [phone] P Neuropsych. testing  Related past to present  Self-monitoring  Self care activities  Emotion regulation skills  Validate/empathize  Facilitate problem solving  Normalize/Reframe  Journaling  Comments  Dx.: F31.81  Goals: States she is seeking emotional stability. She needs guidance in managing some of her physical/medical challenges and limitations. Also, have some interpersonal challenges and seeks feedback on addressing these difficulties.Revised target date is 12-24.  Meds: Seroquel (300mg  daily), Hydrocodone (7.5 mg up to 3x/day), Buspar (10mg  twice daily).  Patient agrees to be seen for a video Public affairs consultant) appointment and understands the limitations of this platform. She was at home and provider was in his office.  Terina says she is still working to take care of her medical issues. She and [name] have made plans for both Thanksgiving and Christmas. They have been going out with friends on a regular (weekly) basis, which has been a positive experience for her. She talked about the fact that [name] tends to say inappropriate things to her in public (when frustrated or angry). He is not receptive to her feedback and it just makes him angry. She says that utility bills at work have increased significantly, due to the complex using fans in her apartment. She is upset and has requested reimbursement. Doing a good job of advocating herself. Emotions are stable and she reports minimal depressive episodes.                                                                                         [name]  [name], PhD Time: 1:10p-2:00p 50 minutes

## 2023-01-18 ENCOUNTER — Ambulatory Visit: Payer: Medicare Other | Attending: Cardiovascular Disease | Admitting: *Deleted

## 2023-01-18 DIAGNOSIS — Z5181 Encounter for therapeutic drug level monitoring: Secondary | ICD-10-CM | POA: Insufficient documentation

## 2023-01-18 DIAGNOSIS — I480 Paroxysmal atrial fibrillation: Secondary | ICD-10-CM | POA: Diagnosis not present

## 2023-01-18 LAB — POCT INR: INR: 1.7 — AB (ref 2.0–3.0)

## 2023-01-18 NOTE — Patient Instructions (Addendum)
Description   Take an extra 1/2 tablet today and then START taking warfarin 1 tablet daily except 1.5 tablets on Tuesdays and Saturdays.    Recheck INR in 3 weeks.  Call with any medication changes or if you are scheduled for surgery  [facility] [phone] Main [phone]

## 2023-01-21 DIAGNOSIS — G518 Other disorders of facial nerve: Secondary | ICD-10-CM | POA: Diagnosis not present

## 2023-01-21 DIAGNOSIS — G43719 Chronic migraine without aura, intractable, without status migrainosus: Secondary | ICD-10-CM | POA: Diagnosis not present

## 2023-01-21 DIAGNOSIS — M791 Myalgia, unspecified site: Secondary | ICD-10-CM | POA: Diagnosis not present

## 2023-01-21 DIAGNOSIS — M542 Cervicalgia: Secondary | ICD-10-CM | POA: Diagnosis not present

## 2023-01-28 ENCOUNTER — Ambulatory Visit: Payer: Medicare Other | Admitting: Psychiatry

## 2023-01-28 ENCOUNTER — Encounter: Payer: Self-pay | Admitting: Psychiatry

## 2023-01-28 DIAGNOSIS — F4310 Post-traumatic stress disorder, unspecified: Secondary | ICD-10-CM

## 2023-01-28 DIAGNOSIS — Z8669 Personal history of other diseases of the nervous system and sense organs: Secondary | ICD-10-CM

## 2023-01-28 DIAGNOSIS — M533 Sacrococcygeal disorders, not elsewhere classified: Secondary | ICD-10-CM | POA: Diagnosis not present

## 2023-01-28 DIAGNOSIS — F319 Bipolar disorder, unspecified: Secondary | ICD-10-CM | POA: Diagnosis not present

## 2023-01-28 DIAGNOSIS — F3132 Bipolar disorder, current episode depressed, moderate: Secondary | ICD-10-CM

## 2023-01-28 DIAGNOSIS — G3184 Mild cognitive impairment, so stated: Secondary | ICD-10-CM

## 2023-01-28 DIAGNOSIS — F4001 Agoraphobia with panic disorder: Secondary | ICD-10-CM | POA: Diagnosis not present

## 2023-01-28 DIAGNOSIS — F411 Generalized anxiety disorder: Secondary | ICD-10-CM

## 2023-01-28 DIAGNOSIS — F5105 Insomnia due to other mental disorder: Secondary | ICD-10-CM

## 2023-01-28 MED ORDER — BUSPIRONE HCL 15 MG PO TABS: 15.0000 mg | ORAL_TABLET | Freq: Two times a day (BID) | ORAL | 1 refills | Status: DC

## 2023-01-28 MED ORDER — QUETIAPINE FUMARATE 300 MG PO TABS: 300.0000 mg | ORAL_TABLET | Freq: Every day | ORAL | 1 refills | Status: DC

## 2023-01-28 NOTE — Progress Notes (Signed)
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    GERD (gastroesophageal reflux disease)    Hematoma 07/06/2020   History of cardioversion    x2   History of cardioversion    Hyperlipidemia    Hypothyroidism    Ingrown toenail 10/10/2020   Migraine headache    Osteomyelitis of foot (HCC) 11/11/2020   Osteoporosis    Pre-diabetes    Septic arthritis of interphalangeal joint of toe of right foot (HCC) 07/06/2020   Neg sleep study 4 years ago Family History  Problem Relation Age of Onset   Arthritis Mother    Depression Mother    Diabetes Mother    Heart disease Mother    Stroke Mother    Early death Father    Heart disease Father    Atrial fibrillation Brother    Hyperlipidemia Brother    Multiple sclerosis Sister    Alcohol abuse Brother    Asthma Brother    Drug abuse Brother    Hypertension Brother    Mental illness Brother     Social History   Socioeconomic History   Marital status: Single    Spouse name: Not on file   Number of children: 3   Years of education: Not on file   Highest education level: Not on file  Occupational History   Occupation: Retired  Tobacco Use   Smoking status: Never   Smokeless  tobacco: Never  Vaping Use   Vaping status: Not on file  Substance and Sexual Activity   Alcohol use: Not Currently   Drug use: No   Sexual activity: Not Currently  Other Topics Concern   Not on file  Social History Narrative   Not on file   Social Determinants of Health   Financial Resource Strain: Not on file  Food Insecurity: Not on file  Transportation Needs: Not on file  Physical Activity: Not on file  Stress: Not on file  Social Connections: Not on file  Intimate Partner Violence: Not on file    Past Medical History, Surgical history, Social history, and Family history were reviewed  and updated as appropriate.   ADOPTED but has twin brother.  Please see review of systems for further details on the patient's review from today.   Objective:   Physical Exam:  LMP  (LMP Unknown)   Physical Exam Constitutional:      General: She is not in acute distress.    Appearance: She is well-developed. She is obese.  Musculoskeletal:        General: No deformity.  Neurological:     Mental Status: She is alert and oriented to person, place, and time.     Motor: No weakness.     Coordination: Coordination normal.     Gait: Gait abnormal.     Comments: No cane and gate quick  Psychiatric:        Attention and Perception: Perception normal. She does not perceive auditory or visual hallucinations.        Mood and Affect: Mood is anxious. Mood is not depressed. Affect is not labile, blunt, angry, tearful or inappropriate.        Speech: Speech normal. Speech is not rapid and pressured or slurred.        Behavior: Behavior normal.        Thought Content: Thought content normal. Thought content is not delusional. Thought content does not include homicidal or suicidal ideation. Thought content does not include suicidal plan.        Cognition and Memory: Cognition is not impaired. She exhibits impaired recent memory.     Comments: Insight and judgment fair No delusions.  Cognition  appears baseline but she feels it's impaired Neat and pleasant Talkative without pressure. Somatic focus.     Lab Review:     Component Value Date/Time   NA 140 12/12/2020 1548   NA 141 12/07/2019 1022   K 4.5 12/12/2020 1548   CL 107 12/12/2020 1548   CO2 26 12/12/2020 1548   GLUCOSE 94 12/12/2020 1548   BUN 14 12/12/2020 1548   BUN 15 12/07/2019 1022   CREATININE 0.99 12/12/2020 1548   CREATININE 1.02 (H) 11/11/2020 1116   CALCIUM 9.5 12/12/2020 1548   PROT 7.1 08/16/2020 1451   ALBUMIN 4.5 08/16/2020 1451   AST 14 (L) 08/16/2020 1451   ALT 10 08/16/2020 1451   ALKPHOS 56 08/16/2020 1451   BILITOT 0.8 08/16/2020 1451   GFRNONAA >60 12/12/2020 1548   GFRNONAA 61 09/02/2020 1552   GFRAA 71 09/02/2020 1552       Component Value Date/Time   WBC 5.5 03/13/2021 1414   WBC 5.7 11/11/2020 1116   RBC 4.60 03/13/2021 1414   RBC 4.45 11/11/2020 1116   HGB 13.9 03/13/2021 1414   HCT 40.0 03/13/2021 1414   PLT 193 03/13/2021 1414   MCV 87 03/13/2021 1414   MCH 30.2 03/13/2021 1414   MCH 29.7 11/11/2020 1116   MCHC 34.8 03/13/2021 1414   MCHC 32.8 11/11/2020 1116   RDW 15.0 03/13/2021 1414   LYMPHSABS 1,448 11/11/2020 1116   MONOABS 0.9 07/15/2019 1150   EOSABS 479 11/11/2020 1116   BASOSABS 63 11/11/2020 1116    Lithium Lvl  Date Value Ref Range Status  08/18/2020 0.17 (L) 0.60 - 1.20 mmol/L Final    Comment:    Performed at [facility], [address] [place], [place] [address]    May 20, 2019 lithium level 0.9 on 450 mg daily and creatinine was 1.1 with normal calcium 07/21/19 lithium 0.4 on 450 mg daily, normal CMP  No  results found for: "PHENYTOIN", "PHENOBARB", "VALPROATE", "CBMZ"   .res Assessment: Plan:     Meredith [name] was seen today for follow-up, depression and anxiety.  Diagnoses and all orders for this visit:  Bipolar affective disorder, rapid cycling (HCC)  Generalized anxiety disorder -     busPIRone (BUSPAR) 15 MG tablet; Take 1 tablet  (15 mg total) by mouth 2 (two) times daily.  Panic disorder with agoraphobia -     busPIRone (BUSPAR) 15 MG tablet; Take 1 tablet (15 mg total) by mouth 2 (two) times daily.  PTSD (post-traumatic stress disorder) -     busPIRone (BUSPAR) 15 MG tablet; Take 1 tablet (15 mg total) by mouth 2 (two) times daily.  Mild cognitive impairment  Insomnia due to mental condition  History of encephalopathy  Bipolar 1 disorder, depressed, moderate (HCC) -     QUEtiapine (SEROQUEL) 300 MG tablet; Take 1 tablet (300 mg total) by mouth at bedtime.   History of lithium toxicity Hx repeated bouts of encephalopathy  30 min face to face time with patient was spent on counseling and coordination of care. We discussed the following.  Meredith [name] is a chronically mentally ill patient with chronic depression and chronic anxiety and multiple med failures.  Overall is more stable psychiatrically on lower-dose quetiapine than in the past  .    [facility] for acute encephalopathy 07/2020.  The cause was unknown but presumed to be medication related.   Cognition appears stable in last couple of years.  .  Using pillbox to help compliance.  Disc danger of mixing up or doubling up meds.  She fills box herself.  Rec she get help with this. Cannot afford branded meds which prevents [place] from using some of the bipolar depression meds like Vraylar.  Few options for depression and anxiety, Option Trileptal not as good for depression. therefore increased Seroquel to 300 mg HS  has doing ok with this so far. Caution re: sedative risks emphasized and fall risk.   Afraid of going lower in Seroquel bc fear of relapse as it is the main mood stabilizer.    Push fluids.  Has to remind herself. Emphasized again.  Says she's doing well with this.   Discussed potential metabolic side effects associated with atypical antipsychotics, as well as potential risk for movement side effects. Advised pt to contact office if movement side effects  occur.   OK Ativan prn for dental procedure 1-2 mg.  No driving after it.  PCP Monia Pouch, Meridee Score  Continue therapy with Dr. Dellia Cloud q 2 weeks.  Supportive therapy dealing with ill friends and chronic health issues   No med changes indicated except Hold trazodone 50 mg HS DT hangover.  Current psych meds Seroquel 300 HS are preventing mood cycling and helping depression Continue buspirone 15 BID helped anxiety.  Follow-up  2-4  mos.  She wants to have frequent follow up appts bc some chronic instability.  Meredith Staggers MD, DFAPA  Please see After Visit Summary for patient specific instructions.  Future Appointments  Date Time Provider Department Center  01/30/2023  1:00 PM Haze Rushing, PhD [facility] None  02/08/2023  1:15 PM [facility] None  02/12/2023  1:00 PM Haze Rushing, PhD [facility] None  02/26/2023  1:00 PM Haze Rushing, PhD [facility] None  03/12/2023  1:00 PM Haze Rushing, PhD [facility] None  03/15/2023  2:00 PM Christell Constant, MD [facility]  03/26/2023  1:00 PM Dellia Cloud,  Kinnie Scales, PhD [facility] None  04/09/2023  1:00 PM Haze Rushing, PhD [facility] None  04/23/2023  1:00 PM Haze Rushing, PhD [facility] None  05/07/2023  1:00 PM Haze Rushing, PhD [facility] None  05/21/2023  1:00 PM Haze Rushing, PhD [facility] None  06/04/2023  1:00 PM Haze Rushing, PhD [facility] None  06/18/2023  1:00 PM Haze Rushing, PhD [facility] None  07/02/2023  1:00 PM Haze Rushing, PhD [facility] None  07/16/2023  1:00 PM Haze Rushing, PhD [facility] None  07/30/2023  1:00 PM Haze Rushing, PhD [facility] None    No orders of the defined types were placed in this encounter.      -------------------------------

## 2023-01-29 ENCOUNTER — Other Ambulatory Visit: Payer: Self-pay | Admitting: Internal Medicine

## 2023-01-29 DIAGNOSIS — I480 Paroxysmal atrial fibrillation: Secondary | ICD-10-CM

## 2023-01-29 NOTE — Telephone Encounter (Signed)
Warfarin 2.5mg  refill Afib Last INR 01/18/23 Last OV 04/24/22

## 2023-01-30 ENCOUNTER — Ambulatory Visit: Payer: Medicare Other | Admitting: Psychology

## 2023-01-30 DIAGNOSIS — F3181 Bipolar II disorder: Secondary | ICD-10-CM | POA: Diagnosis not present

## 2023-01-30 DIAGNOSIS — F319 Bipolar disorder, unspecified: Secondary | ICD-10-CM

## 2023-01-30 NOTE — Progress Notes (Signed)
Meredith [name] is a 72 y.o. female patient   Date: 01/30/2023  Treatment Plan: Diagnosis F31.81 (Bipolar II disorder) [n/a]  Symptoms Depressed or irritable mood. (Status: maintained) -- No Description Entered  Lack of energy. (Status: maintained) -- No Description Entered  Social withdrawal. (Status: maintained) -- No Description Entered  Medication Status compliance  Safety none  If Suicidal or Homicidal State Action Taken: unspecified  Current Risk: low Medications Abilify (Dosage: 30mg )  Hydocodone (Dosage: 7.5mg )  Lamotrigine (Dosage: 200mg /day)  Lithium (Dosage: 450mg /day)  Seroquel (Dosage: 200mg /day)  Objectives Related Problem: Develop healthy cognitive patterns and beliefs about self and the world that lead to alleviation and help prevent the relapse of mood episodes. Description: Verbalize grief, fear, and anger regarding real or imagined losses in life. Target Date: 2024-02-09 Frequency: Daily Modality: individual Progress: 85%  Related Problem: Develop healthy cognitive patterns and beliefs about self and the world that lead to alleviation and help prevent the relapse of mood episodes. Description: Take prescribed medications as directed. Target Date: 2022-02-08 Frequency: Daily Modality: individual Progress: 100%  Related Problem: Develop healthy cognitive patterns and beliefs about self and the world that lead to alleviation and help prevent the relapse of mood episodes. Description: Describe mood state, energy level, amount of control over thoughts, and sleeping pattern. Target Date: 2024-02-09 Frequency: Daily Modality: individual Progress: 80%  Client Response full compliance  Service Location Location, [address]  [place], [place] [address]  Service Code cpt [phone] P Neuropsych. testing  Related past to present  Self-monitoring  Self care activities  Emotion regulation skills  Validate/empathize  Facilitate problem solving  Normalize/Reframe  Journaling  Comments  Dx.: F31.81  Goals: States she is seeking emotional stability. She needs guidance in managing some of her physical/medical challenges and limitations. Also, have some interpersonal challenges and seeks feedback on addressing these difficulties.Revised target date is 12-25.  Meds: Seroquel (300mg  daily), Hydrocodone (7.5 mg up to 3x/day), Buspar (10mg  twice daily).  Patient agrees to be seen for a video Public affairs consultant) appointment and understands the limitations of this platform. She was at home and provider was in his office.  Azari says that Thanksgiving with her brother was good. She is taking care of a number of her medical conditions. She has SI joint injected which helps her pain, but her hip still hurts. She is having some frustrations with her apartment complex and has not reached resolution. This creates both agitation and anger. She is looking forward to the Christmas vacation, which will be relatively quiet.                                                                                           [name]  [name], PhD Time: 1:15p-2:00p 45 minutes

## 2023-02-08 ENCOUNTER — Ambulatory Visit: Payer: Medicare Other | Attending: Cardiovascular Disease

## 2023-02-08 DIAGNOSIS — Z5181 Encounter for therapeutic drug level monitoring: Secondary | ICD-10-CM | POA: Diagnosis not present

## 2023-02-08 DIAGNOSIS — I480 Paroxysmal atrial fibrillation: Secondary | ICD-10-CM

## 2023-02-08 LAB — POCT INR: INR: 1.9 — AB (ref 2.0–3.0)

## 2023-02-08 NOTE — Patient Instructions (Signed)
Description   Take an extra 1/2 tablet today and then START taking warfarin 1 tablet daily except 1.5 tablets on Tuesdays, Thursdays and Saturdays.    Recheck INR in 3 weeks.  Call with any medication changes or if you are scheduled for surgery  [facility] [phone] Main [phone]

## 2023-02-12 ENCOUNTER — Ambulatory Visit: Payer: Medicare Other | Admitting: Psychology

## 2023-02-12 DIAGNOSIS — F3181 Bipolar II disorder: Secondary | ICD-10-CM

## 2023-02-12 DIAGNOSIS — F319 Bipolar disorder, unspecified: Secondary | ICD-10-CM

## 2023-02-12 NOTE — Progress Notes (Signed)
Meredith [name] is a 72 y.o. female patient   Date: 02/12/2023  Treatment Plan: Diagnosis F31.81 (Bipolar II disorder) [n/a]  Symptoms Depressed or irritable mood. (Status: maintained) -- No Description Entered  Lack of energy. (Status: maintained) -- No Description Entered  Social withdrawal. (Status: maintained) -- No Description Entered  Medication Status compliance  Safety none  If Suicidal or Homicidal State Action Taken: unspecified  Current Risk: low Medications Abilify (Dosage: 30mg )  Hydocodone (Dosage: 7.5mg )  Lamotrigine (Dosage: 200mg /day)  Lithium (Dosage: 450mg /day)  Seroquel (Dosage: 200mg /day)  Objectives Related Problem: Develop healthy cognitive patterns and beliefs about self and the world that lead to alleviation and help prevent the relapse of mood episodes. Description: Verbalize grief, fear, and anger regarding real or imagined losses in life. Target Date: 2024-02-09 Frequency: Daily Modality: individual Progress: 85%  Related Problem: Develop healthy cognitive patterns and beliefs about self and the world that lead to alleviation and help prevent the relapse of mood episodes. Description: Take prescribed medications as directed. Target Date: 2022-02-08 Frequency: Daily Modality: individual Progress: 100%  Related Problem: Develop healthy cognitive patterns and beliefs about self and the world that lead to alleviation and help prevent the relapse of mood episodes. Description: Describe mood state, energy level, amount of control over thoughts, and sleeping pattern. Target Date: 2024-02-09 Frequency: Daily Modality: individual Progress: 80%  Client Response full compliance  Service Location Location,  [address] [place], [place] [address]  Service Code cpt [phone] P Neuropsych. testing  Related past to present  Self-monitoring  Self care activities  Emotion regulation skills  Validate/empathize  Facilitate problem solving  Normalize/Reframe  Journaling  Comments  Dx.: F31.81  Goals: States she is seeking emotional stability. She needs guidance in managing some of her physical/medical challenges and limitations. Also, have some interpersonal challenges and seeks feedback on addressing these difficulties.Revised target date is 12-25.  Meds: Seroquel (300mg  daily), Hydrocodone (7.5 mg up to 3x/day), Buspar (10mg  twice daily).  Patient agrees to be seen for a video Public affairs consultant) appointment and understands the limitations of this platform. She was at home and provider was in his office.  Meredith [name] says she and her brother will be going to [place] for 4 nights over Christmas. There is a little family drama, but she feels they will get it resolved. She reflected on the holiday and her past experiences. She has very fond memories of living [place] in the [employer]. She will text her son that she will be in [place] and hope to meet up with oldest son and his family. Nobody has any contact with her son [name] Stable, who she has not seen in 20 years. She is resigned to the lack of contact with him.                                                                                              [name]  [name], PhD Time: 1:20p-2:05p 45 minutes

## 2023-02-13 ENCOUNTER — Ambulatory Visit: Payer: Medicare Other | Admitting: Psychology

## 2023-02-13 DIAGNOSIS — M545 Low back pain, unspecified: Secondary | ICD-10-CM | POA: Diagnosis not present

## 2023-02-13 DIAGNOSIS — Z79891 Long term (current) use of opiate analgesic: Secondary | ICD-10-CM | POA: Diagnosis not present

## 2023-02-13 DIAGNOSIS — G894 Chronic pain syndrome: Secondary | ICD-10-CM | POA: Diagnosis not present

## 2023-02-13 DIAGNOSIS — M533 Sacrococcygeal disorders, not elsewhere classified: Secondary | ICD-10-CM | POA: Diagnosis not present

## 2023-02-13 DIAGNOSIS — M47816 Spondylosis without myelopathy or radiculopathy, lumbar region: Secondary | ICD-10-CM | POA: Diagnosis not present

## 2023-02-13 DIAGNOSIS — M25552 Pain in left hip: Secondary | ICD-10-CM | POA: Diagnosis not present

## 2023-02-25 DIAGNOSIS — M542 Cervicalgia: Secondary | ICD-10-CM | POA: Diagnosis not present

## 2023-02-25 DIAGNOSIS — M791 Myalgia, unspecified site: Secondary | ICD-10-CM | POA: Diagnosis not present

## 2023-02-25 DIAGNOSIS — G43719 Chronic migraine without aura, intractable, without status migrainosus: Secondary | ICD-10-CM | POA: Diagnosis not present

## 2023-02-25 DIAGNOSIS — G518 Other disorders of facial nerve: Secondary | ICD-10-CM | POA: Diagnosis not present

## 2023-02-26 ENCOUNTER — Ambulatory Visit: Payer: Medicare Other | Admitting: Psychology

## 2023-02-26 DIAGNOSIS — F3181 Bipolar II disorder: Secondary | ICD-10-CM

## 2023-02-26 DIAGNOSIS — F319 Bipolar disorder, unspecified: Secondary | ICD-10-CM

## 2023-02-26 NOTE — Progress Notes (Signed)
 Meredith [name] is a 72 y.o. female patient   Date: 02/26/2023  Treatment Plan: Diagnosis F31.81 (Bipolar II disorder) [n/a]  Symptoms Depressed or irritable mood. (Status: maintained) -- No Description Entered  Lack of energy. (Status: maintained) -- No Description Entered  Social withdrawal. (Status: maintained) -- No Description Entered  Medication Status compliance  Safety none  If Suicidal or Homicidal State Action Taken: unspecified  Current Risk: low Medications Abilify  (Dosage: 30mg )  Hydocodone (Dosage: 7.5mg )  Lamotrigine  (Dosage: 200mg /day)  Lithium  (Dosage: 450mg /day)  Seroquel  (Dosage: 200mg /day)  Objectives Related Problem: Develop healthy cognitive patterns and beliefs about self and the world that lead to alleviation and help prevent the relapse of mood episodes. Description: Verbalize grief, fear, and anger regarding real or imagined losses in life. Target Date: 2024-02-09 Frequency: Daily Modality: individual Progress: 85%  Related Problem: Develop healthy cognitive patterns and beliefs about self and the world that lead to alleviation and help prevent the relapse of mood episodes. Description: Take prescribed medications as directed. Target Date: 2022-02-08 Frequency: Daily Modality: individual Progress: 100%  Related Problem: Develop healthy cognitive patterns and beliefs about self and the world that lead to alleviation and help prevent the relapse of mood episodes. Description: Describe mood state, energy level, amount of control over thoughts, and sleeping pattern. Target Date: 2024-02-09 Frequency: Daily Modality: individual Progress: 80%  Client Response full compliance   Service Location Location, [address] [place], [place] [address]  Service Code cpt [phone] P Neuropsych. testing  Related past to present  Self-monitoring  Self care activities  Emotion regulation skills  Validate/empathize  Facilitate problem solving  Normalize/Reframe  Journaling  Comments  Dx.: F31.81  Goals: States she is seeking emotional stability. She needs guidance in managing some of her physical/medical challenges and limitations. Also, have some interpersonal challenges and seeks feedback on addressing these difficulties.Revised target date is 12-25.  Meds: Seroquel  (300mg  daily), Hydrocodone  (7.5 mg up to 3x/day), Buspar  (10mg  twice daily).  Patient agrees to be seen for a video Public Affairs Consultant) appointment and understands the limitations of this platform. She was at home and provider was in his office.  Meredith [name] says she has been having pain in back and hip that radiates down her leg. Saw her oldest son for 2 hours on Christmas Eve in [place]. She is pleased that they had a very nice visit. She is staying in for New Years' and says she has no desire to go anywhere tonight. [name] shared some of her early trauma and difficulty as a young mother and wife. She feels she has come through all of her trauma and is managing well.                                                                                                  [name]  CONI ALM KERNS, PhD Time: 1:10p-2:00p 50 minutes

## 2023-03-01 ENCOUNTER — Ambulatory Visit: Payer: Medicare Other | Attending: Cardiovascular Disease | Admitting: *Deleted

## 2023-03-01 DIAGNOSIS — I480 Paroxysmal atrial fibrillation: Secondary | ICD-10-CM | POA: Diagnosis present

## 2023-03-01 DIAGNOSIS — Z5181 Encounter for therapeutic drug level monitoring: Secondary | ICD-10-CM | POA: Insufficient documentation

## 2023-03-01 LAB — POCT INR: INR: 2.3 (ref 2.0–3.0)

## 2023-03-01 NOTE — Patient Instructions (Signed)
 Description   Continue taking warfarin 1 tablet daily except 1.5 tablets on Tuesdays, Thursdays and Saturdays. Recheck INR in 4 weeks.  Call with any medication changes or if you are scheduled for surgery  [facility] [phone] Main [phone]

## 2023-03-12 ENCOUNTER — Ambulatory Visit: Payer: Medicare Other | Admitting: Psychology

## 2023-03-12 NOTE — Progress Notes (Unsigned)
 Meredith [name] is a 73 y.o. female patient   Date: 03/12/2023  Treatment Plan: Diagnosis F31.81 (Bipolar II disorder) [n/a]  Symptoms Depressed or irritable mood. (Status: maintained) -- No Description Entered  Lack of energy. (Status: maintained) -- No Description Entered  Social withdrawal. (Status: maintained) -- No Description Entered  Medication Status compliance  Safety none  If Suicidal or Homicidal State Action Taken: unspecified  Current Risk: low Medications Abilify  (Dosage: 30mg )  Hydocodone (Dosage: 7.5mg )  Lamotrigine  (Dosage: 200mg /day)  Lithium  (Dosage: 450mg /day)  Seroquel  (Dosage: 200mg /day)  Objectives Related Problem: Develop healthy cognitive patterns and beliefs about self and the world that lead to alleviation and help prevent the relapse of mood episodes. Description: Verbalize grief, fear, and anger regarding real or imagined losses in life. Target Date: 2024-02-09 Frequency: Daily Modality: individual Progress: 85%  Related Problem: Develop healthy cognitive patterns and beliefs about self and the world that lead to alleviation and help prevent the relapse of mood episodes. Description: Take prescribed medications as directed. Target Date: 2022-02-08 Frequency: Daily Modality: individual Progress: 100%  Related Problem: Develop healthy cognitive patterns and beliefs about self and the world that lead to alleviation and help prevent the relapse of mood episodes. Description: Describe mood state, energy level, amount of control over thoughts, and sleeping pattern. Target Date: 2024-02-09 Frequency: Daily Modality: individual Progress:  80%  Client Response full compliance  Service Location Location, [address] [place], [place] [address]  Service Code cpt [phone] P Neuropsych. testing  Related past to present  Self-monitoring  Self care activities  Emotion regulation skills  Validate/empathize  Facilitate problem solving  Normalize/Reframe  Journaling  Comments  Dx.: F31.81  Goals: States she is seeking emotional stability. She needs guidance in managing some of her physical/medical challenges and limitations. Also, have some interpersonal challenges and seeks feedback on addressing these difficulties.Revised target date is 12-25.  Meds: Seroquel  (300mg  daily), Hydrocodone  (7.5 mg up to 3x/day), Buspar  (10mg  twice daily).  Patient agrees to be seen for a video Public Affairs Consultant) appointment and understands the limitations of this platform. She was at home and provider was in his office.  Meredith [name]                                                                                                   .  CONI ALM KERNS, PhD Time: 1:10p-2:00p 50 minutes

## 2023-03-13 ENCOUNTER — Ambulatory Visit: Payer: Medicare Other | Admitting: Psychology

## 2023-03-15 ENCOUNTER — Emergency Department (HOSPITAL_BASED_OUTPATIENT_CLINIC_OR_DEPARTMENT_OTHER): Payer: Medicare Other

## 2023-03-15 ENCOUNTER — Emergency Department (HOSPITAL_BASED_OUTPATIENT_CLINIC_OR_DEPARTMENT_OTHER)
Admission: EM | Admit: 2023-03-15 | Discharge: 2023-03-15 | Disposition: A | Discharge status: Home / Self Care | Payer: Medicare Other

## 2023-03-15 ENCOUNTER — Other Ambulatory Visit: Payer: Self-pay

## 2023-03-15 ENCOUNTER — Ambulatory Visit: Payer: Medicare Other | Admitting: Internal Medicine

## 2023-03-15 ENCOUNTER — Encounter (HOSPITAL_BASED_OUTPATIENT_CLINIC_OR_DEPARTMENT_OTHER): Payer: Self-pay

## 2023-03-15 DIAGNOSIS — N189 Chronic kidney disease, unspecified: Secondary | ICD-10-CM | POA: Diagnosis not present

## 2023-03-15 DIAGNOSIS — W19XXXA Unspecified fall, initial encounter: Secondary | ICD-10-CM | POA: Insufficient documentation

## 2023-03-15 DIAGNOSIS — Z7982 Long term (current) use of aspirin: Secondary | ICD-10-CM | POA: Diagnosis not present

## 2023-03-15 DIAGNOSIS — Y92009 Unspecified place in unspecified non-institutional (private) residence as the place of occurrence of the external cause: Secondary | ICD-10-CM | POA: Insufficient documentation

## 2023-03-15 DIAGNOSIS — R531 Weakness: Secondary | ICD-10-CM | POA: Diagnosis present

## 2023-03-15 DIAGNOSIS — Z7901 Long term (current) use of anticoagulants: Secondary | ICD-10-CM | POA: Diagnosis not present

## 2023-03-15 LAB — CBC WITH DIFFERENTIAL/PLATELET
Abs Immature Granulocytes: 0.02 10*3/uL (ref 0.00–0.07)
Basophils Absolute: 0 10*3/uL (ref 0.0–0.1)
Basophils Relative: 0 %
Eosinophils Absolute: 0.2 10*3/uL (ref 0.0–0.5)
Eosinophils Relative: 2 %
HCT: 39.1 % (ref 36.0–46.0)
Hemoglobin: 13.9 g/dL (ref 12.0–15.0)
Immature Granulocytes: 0 %
Lymphocytes Relative: 14 %
Lymphs Abs: 1.1 10*3/uL (ref 0.7–4.0)
MCH: 31.5 pg (ref 26.0–34.0)
MCHC: 35.5 g/dL (ref 30.0–36.0)
MCV: 88.7 fL (ref 80.0–100.0)
Monocytes Absolute: 0.8 10*3/uL (ref 0.1–1.0)
Monocytes Relative: 10 %
Neutro Abs: 5.4 10*3/uL (ref 1.7–7.7)
Neutrophils Relative %: 74 %
Platelets: 243 10*3/uL (ref 150–400)
RBC: 4.41 MIL/uL (ref 3.87–5.11)
RDW: 13.1 % (ref 11.5–15.5)
WBC: 7.4 10*3/uL (ref 4.0–10.5)
nRBC: 0 % (ref 0.0–0.2)

## 2023-03-15 LAB — COMPREHENSIVE METABOLIC PANEL
ALT: 32 U/L (ref 0–44)
AST: 34 U/L (ref 15–41)
Albumin: 4.3 g/dL (ref 3.5–5.0)
Alkaline Phosphatase: 35 U/L — ABNORMAL LOW (ref 38–126)
Anion gap: 10 (ref 5–15)
BUN: 28 mg/dL — ABNORMAL HIGH (ref 8–23)
CO2: 26 mmol/L (ref 22–32)
Calcium: 8.8 mg/dL — ABNORMAL LOW (ref 8.9–10.3)
Chloride: 101 mmol/L (ref 98–111)
Creatinine, Ser: 1.06 mg/dL — ABNORMAL HIGH (ref 0.44–1.00)
GFR, Estimated: 56 mL/min — ABNORMAL LOW (ref >60)
Glucose, Bld: 94 mg/dL (ref 70–99)
Potassium: 3 mmol/L — ABNORMAL LOW (ref 3.5–5.1)
Sodium: 137 mmol/L (ref 135–145)
Total Bilirubin: 0.8 mg/dL (ref 0.0–1.2)
Total Protein: 6.8 g/dL (ref 6.5–8.1)

## 2023-03-15 LAB — CK: Total CK: 673 U/L — ABNORMAL HIGH (ref 38–234)

## 2023-03-15 LAB — PROTIME-INR
INR: 1.2 (ref 0.8–1.2)
Prothrombin Time: 15.6 s — ABNORMAL HIGH (ref 11.4–15.2)

## 2023-03-15 MED ORDER — HYDROCODONE-ACETAMINOPHEN 5-325 MG PO TABS
2.0000 | ORAL_TABLET | Freq: Once | ORAL | Status: AC
  Administered 2023-03-15: 2 via ORAL
  Filled 2023-03-15: qty 2

## 2023-03-15 MED ORDER — SODIUM CHLORIDE 0.9 % IV BOLUS
1000.0000 mL | Freq: Once | INTRAVENOUS | Status: AC
  Administered 2023-03-15: 1000 mL via INTRAVENOUS

## 2023-03-15 NOTE — ED Notes (Signed)
Alex, tech ambulated patient in hallway with her cane, successful. Dr. Rhae Hammock notified.

## 2023-03-15 NOTE — Discharge Instructions (Signed)
Your workup today was reassuring.  Please follow-up with your doctor.  Return to the ER for worsening symptoms.

## 2023-03-15 NOTE — ED Provider Notes (Signed)
[place] EMERGENCY DEPARTMENT AT [facility] Provider Note   CSN: [id number] Arrival date & time: 03/15/23  1049     History  Chief Complaint  Patient presents with   Meredith [name] is a 73 y.o. female.  73 year old female with past medical history of atrial fibrillation on Coumadin as well as chronic kidney disease presenting to the emergency department today after lying on her bedroom floor for 12 hours last night.  The patient states she had a diarrheal illness earlier this week that is since resolved.  She has had some generalized weakness with this.  The patient states that the diarrhea has since improved.  She has felt some generalized weakness but denies any focal weakness.  She states that last night she was trying to get in her bed after she went to the bathroom and her legs felt weak.  She fell to the floor slowly.  She denies any injuries from this.  She reports she has chronic back and hip pain which is no different than her baseline.  She did not hit her head.  She is on Coumadin.  She states that she has been taking his as prescribed and that she has not been eating and drinking normally throughout this week and has not had her INR checked in quite some time.   Fall       Home Medications Prior to Admission medications   Medication Sig Start Date End Date Taking? Authorizing Provider  alendronate (FOSAMAX) 35 MG tablet Take 35 mg by mouth once a week. 02/02/22   [provider]  Ascorbic Acid (VITAMIN C) 1000 MG tablet Take 1,000 mg by mouth daily.    [provider]  aspirin 325 MG tablet Take 325 mg by mouth daily as needed for headache.     [provider]  baclofen (LIORESAL) 10 MG tablet Take 1 tablet by mouth as needed.    [provider]  bisacodyl (DULCOLAX) 5 MG EC tablet PRN 03/18/20   [provider]  busPIRone (BUSPAR) 15 MG tablet Take 1 tablet (15 mg total) by mouth 2 (two) times daily.  01/28/23   Cottle, Steva Ready., MD  Cholecalciferol (VITAMIN D3) 125 MCG (5000 UT) TABS Take 1 tablet by mouth daily at 12 noon.    [provider]  clotrimazole (LOTRIMIN) 1 % cream as needed (skin irritation). 08/03/19   [provider]  docusate sodium (COLACE) 100 MG capsule Take 100 mg by mouth 2 (two) times daily.    [provider]  EPINEPHrine 0.3 mg/0.3 mL IJ SOAJ injection  07/07/19   [provider]  estradiol (ESTRACE) 1 MG tablet Take 1 mg by mouth daily. 01/27/19   [provider]  ferrous sulfate 325 (65 FE) MG tablet Take 325 mg by mouth 2 (two) times daily.    [provider]  furosemide (LASIX) 20 MG tablet Take 1 tablet (20 mg total) by mouth daily as needed. 03/09/22   Chandrasekhar, Mahesh A, MD  Galcanezumab-gnlm (EMGALITY) 120 MG/ML SOAJ Inject into the skin every 30 (thirty) days.    [provider]  HYDROcodone-acetaminophen (NORCO) 7.5-325 MG tablet Take 1 tablet by mouth 3 (three) times daily as needed for moderate pain or severe pain.    [provider]  ipratropium (ATROVENT) 0.03 % nasal spray 1-2 sprays in each nostril 03/08/20   [provider]  levocetirizine (XYZAL) 5 MG tablet Take 5 mg by mouth every  evening. 07/07/19   [provider]  levothyroxine (SYNTHROID) 112 MCG tablet Take 112 mcg by mouth daily before breakfast.    [provider]  loperamide (IMODIUM) 2 MG capsule Take 1 capsule (2 mg total) by mouth as needed for diarrhea or loose stools. 07/20/19   Rhetta Mura, MD  Melatonin 5 MG TABS Take 20 mg by mouth at bedtime.    [provider]  metoprolol succinate (TOPROL-XL) 50 MG 24 hr tablet TAKE 1 TABLET BY MOUTH ONCE DAILY WITH  OR  IMMEDIATELY  FOLLOWING  A  MEAL 05/11/22   Chandrasekhar, Mahesh A, MD  Multiple Vitamin (MULTIVITAMIN WITH MINERALS) TABS tablet Take 1 tablet by mouth daily. 08/24/20   Edsel Petrin, DO  naloxone [facility]) 0.4 MG/ML  injection  05/05/19   [provider]  ondansetron (ZOFRAN) 4 MG tablet Take 1 tablet (4 mg total) by mouth every 8 (eight) hours as needed for nausea or vomiting. 11/11/19   Park Liter, DPM  pantoprazole (PROTONIX) 40 MG tablet Take 1 tablet (40 mg total) by mouth daily. 07/07/18   [place], Betty G, MD  pravastatin (PRAVACHOL) 20 MG tablet Take 20 mg by mouth daily. 10/22/17   [provider]  pregabalin (LYRICA) 150 MG capsule Take 150 mg by mouth 3 (three) times daily.    [provider]  QUEtiapine (SEROQUEL) 300 MG tablet Take 1 tablet (300 mg total) by mouth at bedtime. 01/28/23   Cottle, Steva Ready., MD  traZODone (DESYREL) 50 MG tablet Take 1 tablet (50 mg total) by mouth at bedtime as needed for sleep. Patient not taking: Reported on 01/28/2023 09/04/22   Lauraine Rinne., MD  verapamil (VERELAN PM) 120 MG 24 hr capsule Take 1 capsule (120 mg total) by mouth at bedtime. Hold for low blood pressures 08/23/20   Mikhail, [place], DO  vitamin B-12 1000 MCG tablet Take 1 tablet (1,000 mcg total) by mouth daily. 08/24/20   Edsel Petrin, DO  warfarin (COUMADIN) 2.5 MG tablet TAKE 1 TO 1 & 1/2 (ONE TO ONE & ONE-HALF) TABLETS BY MOUTH ONCE DAILY AS DIRECTED BY  COUMADIN  CLINIC. 01/29/23   [name], Deloris Ping, MD      Allergies    Codeine, Adhesive [tape], Celebrex [celecoxib], Erythromycin, Other, Penicillamine, Sulfa antibiotics, Tricyclic antidepressants, Amoxicillin, Ampicillin, Cephalexin, Macrodantin [nitrofurantoin], and Penicillins    Review of Systems   Review of Systems  Constitutional:  Positive for fatigue.  All other systems reviewed and are negative.   Physical Exam Updated Vital Signs BP 113/65   Pulse 95   Temp 97.8 F (36.6 C) (Oral)   Resp 19   Ht 5' (1.524 m)   Wt 84.8 kg   LMP  (LMP Unknown)   SpO2 100%   BMI 36.52 kg/m  Physical Exam Vitals and nursing note reviewed.   Gen: NAD Eyes: PERRL, EOMI HEENT: no oropharyngeal  swelling Neck: trachea midline, no cervical tenderness Resp: clear to auscultation bilaterally Card: RRR, no murmurs, rubs, or gallops Abd: nontender, nondistended Extremities: no calf tenderness, no edema MSK: No tenderness over the hips or pelvis noted, no point tenderness over the thoracic or lumbar spine noted Vascular: 2+ radial pulses bilaterally, 2+ DP pulses bilaterally Neuro: No focal deficits Skin: no rashes Psyc: acting appropriately   ED Results / Procedures / Treatments   Labs (all labs ordered are listed, but only abnormal results are displayed) Labs Reviewed  COMPREHENSIVE METABOLIC PANEL - Abnormal; Notable for the following  components:      Result Value   Potassium 3.0 (*)    BUN 28 (*)    Creatinine, Ser 1.06 (*)    Calcium 8.8 (*)    Alkaline Phosphatase 35 (*)    GFR, Estimated 56 (*)    All other components within normal limits  CK - Abnormal; Notable for the following components:   Total CK 673 (*)    All other components within normal limits  PROTIME-INR - Abnormal; Notable for the following components:   Prothrombin Time 15.6 (*)    All other components within normal limits  CBC WITH DIFFERENTIAL/PLATELET    EKG None  Radiology CT Head Wo Contrast Result Date: 03/15/2023 CLINICAL DATA:  Provided history: Head trauma, minor. EXAM: CT HEAD WITHOUT CONTRAST TECHNIQUE: Contiguous axial images were obtained from the base of the skull through the vertex without intravenous contrast. RADIATION DOSE REDUCTION: This exam was performed according to the departmental dose-optimization program which includes automated exposure control, adjustment of the mA and/or kV according to patient size and/or use of iterative reconstruction technique. COMPARISON:  Brain MRI 08/17/2020.  Head CT 08/16/2020. FINDINGS: Brain: Mild generalized cerebral atrophy. Commensurate prominence of the ventricles and sulci. There is no acute intracranial hemorrhage. No demarcated cortical  infarct. No extra-axial fluid collection. No evidence of an intracranial mass. No midline shift. Vascular: No hyperdense vessel.  Atherosclerotic calcifications. Skull: No calvarial fracture or aggressive osseous lesion. Sinuses/Orbits: No mass or acute finding within the imaged orbits. No significant paranasal sinus disease. IMPRESSION: 1.  No evidence of an acute intracranial abnormality. 2. Mild generalized cerebral atrophy. Electronically Signed   By: Jackey Loge D.O.   On: 03/15/2023 12:16    Procedures Procedures    Medications Ordered in ED Medications  sodium chloride 0.9 % bolus 1,000 mL (0 mLs Intravenous Stopped 03/15/23 1330)  HYDROcodone-acetaminophen (NORCO/VICODIN) 5-325 MG per tablet 2 tablet (2 tablets Oral Given 03/15/23 1132)    ED Course/ Medical Decision Making/ A&P                                 Medical Decision Making 73 year old female with past medical history of chronic kidney disease and atrial fibrillation presenting to the emergency department today after a fall at home.  The patient does not appear to have any injuries from this.  I will further evaluate her here with a CT scan of her head since she is on Coumadin to eval for intracranial hemorrhage.  Will obtain basic labs here to eval for dehydration as well as a CK since she was on the floor for the night.  Will also give her hydrocodone for pain which is what she normally takes for chronic pain.  I will reevaluate for ultimate disposition.  She does not have any focal neurological deficits to suggest CVA at this time.  I will reevaluate for ultimate disposition.  The patient's labs are reassuring overall.  Her INR was low.  She is encouraged to call her primary care doctor regarding this to see if they would like to make any changes with her medications.  Her CT scan is unremarkable.  CK is mildly elevated but not to the level of rhabdomyolysis.  Patient given IV fluids and is feeling better.  She is ambulatory  with steady gait.  She is discharged with return precautions.  Amount and/or Complexity of Data Reviewed Labs: ordered. Radiology: ordered.  Risk Prescription  drug management.           Final Clinical Impression(s) / ED Diagnoses Final diagnoses:  Fall in home, initial encounter  Generalized weakness    Rx / DC Orders ED Discharge Orders     None         Durwin Glaze, MD 03/15/23 1334

## 2023-03-15 NOTE — ED Notes (Signed)
Per Dr. Rhae Hammock, increase NS rate from 500 ml/hr to 999 ml/hr. Will discharge after IV fluids complete.

## 2023-03-15 NOTE — ED Triage Notes (Addendum)
Brought in by EMS for eval of mechanical fall while trying to get into bed last night. Reports lying in the floor for approx 12 hours. Denies hitting head. Negative LOC. Reports lower back pain and bilateral hip pain. Started feeling poorly on 03/07/2023 and reports diarrhea episodes that weekend but has resolved on tuesday. Decreased appetite x1 week and report eating yesterday.denies cough or chills.

## 2023-03-26 ENCOUNTER — Ambulatory Visit (INDEPENDENT_AMBULATORY_CARE_PROVIDER_SITE_OTHER): Payer: Medicare Other | Admitting: Psychology

## 2023-03-26 DIAGNOSIS — F3181 Bipolar II disorder: Secondary | ICD-10-CM

## 2023-03-26 DIAGNOSIS — F319 Bipolar disorder, unspecified: Secondary | ICD-10-CM

## 2023-03-26 NOTE — Progress Notes (Signed)
Meredith [name] is a 73 y.o. female patient   Date: 03/26/2023  Treatment Plan: Diagnosis F31.81 (Bipolar II disorder) [n/a]  Symptoms Depressed or irritable mood. (Status: maintained) -- No Description Entered  Lack of energy. (Status: maintained) -- No Description Entered  Social withdrawal. (Status: maintained) -- No Description Entered  Medication Status compliance  Safety none  If Suicidal or Homicidal State Action Taken: unspecified  Current Risk: low Medications Abilify (Dosage: 30mg )  Hydocodone (Dosage: 7.5mg )  Lamotrigine (Dosage: 200mg /day)  Lithium (Dosage: 450mg /day)  Seroquel (Dosage: 200mg /day)  Objectives Related Problem: Develop healthy cognitive patterns and beliefs about self and the world that lead to alleviation and help prevent the relapse of mood episodes. Description: Verbalize grief, fear, and anger regarding real or imagined losses in life. Target Date: 2024-02-09 Frequency: Daily Modality: individual Progress: 85%  Related Problem: Develop healthy cognitive patterns and beliefs about self and the world that lead to alleviation and help prevent the relapse of mood episodes. Description: Take prescribed medications as directed. Target Date: 2022-02-08 Frequency: Daily Modality: individual Progress: 100%  Related Problem: Develop healthy cognitive patterns and beliefs about self and the world that lead to alleviation and help prevent the relapse of mood episodes. Description: Describe mood state, energy level, amount of control over thoughts, and sleeping pattern. Target Date: 2024-02-09 Frequency: Daily Modality:  individual Progress: 80%  Client Response full compliance  Service Location Location, [address] [place], [place] [address]  Service Code cpt [phone] P Neuropsych. testing  Related past to present  Self-monitoring  Self care activities  Emotion regulation skills  Validate/empathize  Facilitate problem solving  Normalize/Reframe  Journaling  Comments  Dx.: F31.81  Goals: States she is seeking emotional stability. She needs guidance in managing some of her physical/medical challenges and limitations. Also, have some interpersonal challenges and seeks feedback on addressing these difficulties.Revised target date is 12-25.  Meds: Seroquel (300mg  daily), Hydrocodone (7.5 mg up to 3x/day), Buspar (10mg  twice daily).  Patient agrees to be seen for a video Public affairs consultant) appointment and understands the limitations of this platform. She was at home and provider was in his office.  [name] says she was sick for 2 weeks with an intestinal infection. Had been feeling awful. Was treated by ER and doctors and now feeling better. She said her brother was help and attentive. She had to cancel a number of appointments due to being ill. She purchased a service that detects if she falls and will send health. She says she sees Dr. Jennelle Human  next week and does not anticipate and medication changes. nMoods are stable and she is feeling a little more optimistic. Sleep/appetite are normal and no problems reported.                                                                                                     [name]                                                  [name], PhD Time: 1:10p-2:00p 50 minutes

## 2023-03-27 ENCOUNTER — Ambulatory Visit: Payer: Medicare Other | Admitting: Psychology

## 2023-03-29 ENCOUNTER — Ambulatory Visit: Payer: Medicare Other | Attending: Cardiovascular Disease | Admitting: *Deleted

## 2023-03-29 DIAGNOSIS — I480 Paroxysmal atrial fibrillation: Secondary | ICD-10-CM

## 2023-03-29 DIAGNOSIS — Z5181 Encounter for therapeutic drug level monitoring: Secondary | ICD-10-CM | POA: Diagnosis not present

## 2023-03-29 LAB — POCT INR: INR: 2.1 (ref 2.0–3.0)

## 2023-03-29 NOTE — Patient Instructions (Addendum)
Description   Continue taking warfarin 1 tablet daily except 1.5 tablets on Tuesdays, Thursdays and Saturdays. Recheck INR in 4 weeks.  Call with any medication changes or if you are scheduled for surgery  [facility] [phone] or [phone] Main [phone]

## 2023-04-01 ENCOUNTER — Encounter: Payer: Self-pay | Admitting: Psychiatry

## 2023-04-01 ENCOUNTER — Ambulatory Visit: Payer: Medicare Other | Admitting: Psychiatry

## 2023-04-01 DIAGNOSIS — G3184 Mild cognitive impairment, so stated: Secondary | ICD-10-CM

## 2023-04-01 DIAGNOSIS — F411 Generalized anxiety disorder: Secondary | ICD-10-CM | POA: Diagnosis not present

## 2023-04-01 DIAGNOSIS — F4001 Agoraphobia with panic disorder: Secondary | ICD-10-CM

## 2023-04-01 DIAGNOSIS — F319 Bipolar disorder, unspecified: Secondary | ICD-10-CM | POA: Diagnosis not present

## 2023-04-01 DIAGNOSIS — F5105 Insomnia due to other mental disorder: Secondary | ICD-10-CM

## 2023-04-01 DIAGNOSIS — F4310 Post-traumatic stress disorder, unspecified: Secondary | ICD-10-CM

## 2023-04-01 DIAGNOSIS — Z8669 Personal history of other diseases of the nervous system and sense organs: Secondary | ICD-10-CM

## 2023-04-01 NOTE — Progress Notes (Signed)
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    Atrial fibrillation (HCC)    Bipolar 2 disorder (HCC)    Chronic kidney disease    Depression    GERD (gastroesophageal reflux disease)    Hematoma 07/06/2020   History of cardioversion    x2   History of cardioversion    Hyperlipidemia    Hypothyroidism    Ingrown toenail 10/10/2020   Migraine headache    Osteomyelitis of foot (HCC) 11/11/2020   Osteoporosis    Pre-diabetes    Septic arthritis of interphalangeal joint of toe of right foot (HCC) 07/06/2020   Neg sleep study 4 years ago Family History  Problem Relation Age of Onset   Arthritis Mother    Depression Mother    Diabetes Mother    Heart disease Mother    Stroke Mother    Early death Father    Heart disease Father    Atrial fibrillation Brother    Hyperlipidemia Brother    Multiple sclerosis Sister    Alcohol abuse Brother    Asthma Brother    Drug abuse Brother    Hypertension Brother    Mental illness Brother     Social History   Socioeconomic History   Marital status: Divorced    Spouse name: Not on file   Number of children: 3   Years of education: Not on file    Highest education level: Not on file  Occupational History   Occupation: Retired  Tobacco Use   Smoking status: Never   Smokeless tobacco: Never  Vaping Use   Vaping status: Not on file  Substance and Sexual Activity   Alcohol use: Not Currently   Drug use: No   Sexual activity: Not Currently  Other Topics Concern   Not on file  Social History Narrative   Not on file   Social Drivers of Health   Financial Resource Strain: Not  on file  Food Insecurity: Not on file  Transportation Needs: Not on file  Physical Activity: Not on file  Stress: Not on file  Social Connections: Not on file  Intimate Partner Violence: Not on file    Past Medical History, Surgical history, Social history, and Family history were reviewed and updated as appropriate.   ADOPTED but has twin brother.  Please see review of systems for further details on the patient's review from today.   Objective:   Physical Exam:  LMP  (LMP Unknown)   Physical Exam Constitutional:      General: She is not in acute distress.    Appearance: She is well-developed. She is obese.  Musculoskeletal:        General: No deformity.  Neurological:     Mental Status: She is alert and oriented to person, place, and time.     Motor: No weakness.     Coordination: Coordination normal.     Gait: Gait abnormal.     Comments: cane with good pace in gait   Psychiatric:        Attention and Perception: Perception normal. She does not perceive auditory or visual hallucinations.        Mood and Affect: Mood is anxious. Mood is not depressed. Affect is not labile, blunt, angry, tearful or inappropriate.        Speech: Speech normal. Speech is not rapid and pressured or slurred.        Behavior: Behavior normal.        Thought Content: Thought content normal. Thought content is not delusional. Thought content does not include homicidal or suicidal ideation. Thought content does not include suicidal plan.        Cognition and  Memory: Cognition is not impaired. She exhibits impaired recent memory.     Comments: Insight and judgment fair No delusions.  Cognition appears baseline but she feels it's impaired Neat and pleasant Talkative without pressure. Somatic focus.     Lab Review:     Component Value Date/Time   NA 137 03/15/2023 1139   NA 141 12/07/2019 1022   K 3.0 (L) 03/15/2023 1139   CL 101 03/15/2023 1139   CO2 26 03/15/2023 1139   GLUCOSE 94 03/15/2023 1139   BUN 28 (H) 03/15/2023 1139   BUN 15 12/07/2019 1022   CREATININE 1.06 (H) 03/15/2023 1139   CREATININE 1.02 (H) 11/11/2020 1116   CALCIUM 8.8 (L) 03/15/2023 1139   PROT 6.8 03/15/2023 1139   ALBUMIN 4.3 03/15/2023 1139   AST 34 03/15/2023 1139   ALT 32 03/15/2023 1139   ALKPHOS 35 (L) 03/15/2023 1139   BILITOT 0.8 03/15/2023 1139   GFRNONAA 56 (L) 03/15/2023 1139   GFRNONAA 61 09/02/2020 1552   GFRAA 71 09/02/2020 1552       Component Value Date/Time   WBC 7.4 03/15/2023 1139   RBC 4.41 03/15/2023 1139   HGB 13.9 03/15/2023 1139   HGB 13.9 03/13/2021 1414   HCT 39.1 03/15/2023 1139   HCT 40.0 03/13/2021 1414   PLT 243 03/15/2023 1139   PLT 193 03/13/2021 1414   MCV 88.7 03/15/2023 1139   MCV 87 03/13/2021 1414   MCH 31.5 03/15/2023 1139   MCHC 35.5 03/15/2023 1139   RDW 13.1 03/15/2023 1139   RDW 15.0 03/13/2021 1414   LYMPHSABS 1.1 03/15/2023 1139   MONOABS 0.8 03/15/2023 1139   EOSABS 0.2 03/15/2023 1139   BASOSABS 0.0 03/15/2023 1139    Lithium Lvl  Date Value Ref Range Status  08/18/2020 0.17 (L) 0.60 - 1.20 mmol/L Final    Comment:    Performed at [facility], [address], [place], [place] [address]    May 20, 2019 lithium level 0.9 on 450 mg daily and creatinine was 1.1 with normal calcium 07/21/19 lithium 0.4 on 450 mg daily, normal CMP  No results found for: "PHENYTOIN", "PHENOBARB", "VALPROATE", "CBMZ"   .res Assessment: Plan:     Sairah was seen today for follow-up, depression,  anxiety, sleeping problem and fatigue.  Diagnoses and all orders for this visit:  Bipolar affective disorder, rapid cycling (HCC)  Generalized anxiety disorder  Panic disorder with agoraphobia  PTSD (post-traumatic stress disorder)  Mild cognitive impairment  Insomnia due to mental condition  History of encephalopathy  History of lithium toxicity Hx repeated bouts of encephalopathy  30 min face to face time with patient was spent on counseling and coordination of care. We discussed the following.  Kameelah is a chronically mentally ill patient with chronic depression and chronic anxiety and multiple med failures.  Overall is more stable psychiatrically on lower-dose quetiapine than in the past  .    [facility] for acute encephalopathy 07/2020.  The cause was unknown but presumed to be medication related.   Cognition appears stable in last couple of years.  .  Using pillbox to help compliance.  Disc danger of mixing up or doubling up meds.  She fills box herself.  Rec she get help with this. Cannot afford branded meds which prevents [place] from using some of the bipolar depression meds like Vraylar.  Few options for depression and anxiety, Option Trileptal not as good for depression. therefore increased Seroquel to 300 mg HS  has doing ok with this so far. Caution re: sedative risks emphasized and fall risk.   Afraid of going lower in Seroquel bc fear of relapse as it is the main mood stabilizer.    Push fluids.  Has to remind herself. Emphasized again.  Says she's doing well with this.  Disc this again to try to reduce risk recurrent dehydration and fall risk.    Discussed potential metabolic side effects associated with atypical antipsychotics, as well as potential risk for movement side effects. Advised pt to contact office if movement side effects occur.  Also disc risk quetiapine contributing to orthostasis.   OK Ativan prn for dental procedure 1-2 mg.  No driving after it.  PCP  Monia Pouch, Meridee Score  Continue therapy with Dr. Dellia Cloud q 2 weeks.  Supportive therapy dealing with ill friends and chronic health issues   No med changes indicated  Current psych meds Seroquel 300 HS are preventing mood cycling and helping depression Continue buspirone 15 BID helped anxiety.  Follow-up  2-4  mos.  She wants to have frequent follow up appts bc some chronic instability.  Meredith Staggers MD, DFAPA  Please see After Visit Summary for patient specific instructions.  Future Appointments  Date Time Provider Department Center  04/09/2023  1:00 PM Haze Rushing, PhD [facility] None  04/23/2023  1:00 PM Haze Rushing, PhD [facility] None  04/26/2023  1:45 PM [facility] None  05/07/2023  1:00 PM Haze Rushing, PhD [facility] None  05/21/2023  1:00 PM Haze Rushing, PhD [facility] None  06/04/2023  1:00 PM Haze Rushing, PhD [facility] None  06/18/2023  1:00 PM Haze Rushing, PhD [facility] None  07/02/2023  1:00 PM Dellia Cloud,  Kinnie Scales, PhD [facility] None  07/08/2023  3:40 PM Christell Constant, MD [facility]  07/16/2023  1:00 PM Haze Rushing, PhD [facility] None  07/30/2023  1:00 PM Haze Rushing, PhD [facility] None    No orders of the defined types were placed in this encounter.      -------------------------------

## 2023-04-09 ENCOUNTER — Ambulatory Visit: Payer: Medicare Other | Admitting: Psychology

## 2023-04-09 DIAGNOSIS — F319 Bipolar disorder, unspecified: Secondary | ICD-10-CM

## 2023-04-09 NOTE — Progress Notes (Signed)
Meredith [name] is a 73 y.o. female patient   Date: 04/09/2023  Treatment Plan: Diagnosis F31.81 (Bipolar II disorder) [n/a]  Symptoms Depressed or irritable mood. (Status: maintained) -- No Description Entered  Lack of energy. (Status: maintained) -- No Description Entered  Social withdrawal. (Status: maintained) -- No Description Entered  Medication Status compliance  Safety none  If Suicidal or Homicidal State Action Taken: unspecified  Current Risk: low Medications Abilify (Dosage: 30mg )  Hydocodone (Dosage: 7.5mg )  Lamotrigine (Dosage: 200mg /day)  Lithium (Dosage: 450mg /day)  Seroquel (Dosage: 200mg /day)  Objectives Related Problem: Develop healthy cognitive patterns and beliefs about self and the world that lead to alleviation and help prevent the relapse of mood episodes. Description: Verbalize grief, fear, and anger regarding real or imagined losses in life. Target Date: 2024-02-09 Frequency: Daily Modality: individual Progress: 85%  Related Problem: Develop healthy cognitive patterns and beliefs about self and the world that lead to alleviation and help prevent the relapse of mood episodes. Description: Take prescribed medications as directed. Target Date: 2022-02-08 Frequency: Daily Modality: individual Progress: 100%  Related Problem: Develop healthy cognitive patterns and beliefs about self and the world that lead to alleviation and help prevent the relapse of mood episodes. Description: Describe mood state, energy level, amount of control over thoughts, and sleeping pattern. Target Date:  2024-02-09 Frequency: Daily Modality: individual Progress: 80%  Client Response full compliance  Service Location Location, [address] [place], [place] [address]  Service Code cpt [phone] P Neuropsych. testing  Related past to present  Self-monitoring  Self care activities  Emotion regulation skills  Validate/empathize  Facilitate problem solving  Normalize/Reframe  Journaling  Comments  Dx.: F31.81  Goals: States she is seeking emotional stability. She needs guidance in managing some of her physical/medical challenges and limitations. Also, have some interpersonal challenges and seeks feedback on addressing these difficulties.Revised target date is 12-25.  Meds: Seroquel (300mg  daily), Hydrocodone (7.5 mg up to 3x/day), Buspar (10mg  twice daily).  Patient agrees to be seen for a video Public affairs consultant) appointment and understands the limitations of this platform. She was at home and provider was in his office.  Meredith [name] saw Dr. Jennelle Human on the 3rd., and the neurologist on the 4th. No medicine changes with either appointment. She states that she is tired of doctor's appointments, but is going to all of her appointments and complying. She says she is staying in at home most of the time  to minimize the potential to fall. She is still, however, going out weekly with her brother. They are getting along well and she is speaking with him frequently.                                                                                                         [name]                                                  [name], PhD Time: 1:10p-2:00p 50 minutes

## 2023-04-10 ENCOUNTER — Ambulatory Visit: Payer: Medicare Other | Admitting: Psychology

## 2023-04-23 ENCOUNTER — Ambulatory Visit (INDEPENDENT_AMBULATORY_CARE_PROVIDER_SITE_OTHER): Payer: Medicare Other | Admitting: Psychology

## 2023-04-23 DIAGNOSIS — F319 Bipolar disorder, unspecified: Secondary | ICD-10-CM

## 2023-04-23 DIAGNOSIS — F3181 Bipolar II disorder: Secondary | ICD-10-CM

## 2023-04-23 NOTE — Progress Notes (Signed)
 Frankee Brookelynn Hamor is a 73 y.o. female patient   Date: 04/23/2023  Treatment Plan: Diagnosis F31.81 (Bipolar II disorder) [n/a]  Symptoms Depressed or irritable mood. (Status: maintained) -- No Description Entered  Lack of energy. (Status: maintained) -- No Description Entered  Social withdrawal. (Status: maintained) -- No Description Entered  Medication Status compliance  Safety none  If Suicidal or Homicidal State Action Taken: unspecified  Current Risk: low Medications Abilify (Dosage: 30mg )  Hydocodone (Dosage: 7.5mg )  Lamotrigine (Dosage: 200mg /day)  Lithium (Dosage: 450mg /day)  Seroquel (Dosage: 200mg /day)  Objectives Related Problem: Develop healthy cognitive patterns and beliefs about self and the world that lead to alleviation and help prevent the relapse of mood episodes. Description: Verbalize grief, fear, and anger regarding real or imagined losses in life. Target Date: 2024-02-09 Frequency: Daily Modality: individual Progress: 85%  Related Problem: Develop healthy cognitive patterns and beliefs about self and the world that lead to alleviation and help prevent the relapse of mood episodes. Description: Take prescribed medications as directed. Target Date: 2022-02-08 Frequency: Daily Modality: individual Progress: 100%  Related Problem: Develop healthy cognitive patterns and beliefs about self and the world that lead to alleviation and help prevent the relapse of mood episodes. Description: Describe mood state, energy level, amount of control over thoughts, and sleeping  pattern. Target Date: 2024-02-09 Frequency: Daily Modality: individual Progress: 80%  Client Response full compliance  Service Location Location, [address] [place], [place] [address]  Service Code cpt [phone] P Neuropsych. testing  Related past to present  Self-monitoring  Self care activities  Emotion regulation skills  Validate/empathize  Facilitate problem solving  Normalize/Reframe  Journaling  Comments  Dx.: F31.81  Goals: States she is seeking emotional stability. She needs guidance in managing some of her physical/medical challenges and limitations. Also, have some interpersonal challenges and seeks feedback on addressing these difficulties.Revised target date is 12-25.  Meds: Seroquel (300mg  daily), Hydrocodone (7.5 mg up to 3x/day), Buspar (10mg  twice daily).  Patient agrees to be seen for a video Public affairs consultant) appointment and understands the limitations of this platform. She was at home and provider was in his office.  Wyatt says that she spoke with her pastor, who called to check on her. She is staying involved at church, which is her primary social connection. She talked about her political opinions and expressed frustration.  She states she is feeling better lately, with the exception of some sleep disturbance. She has been having some bad dreams about her divorce and the associated trauma. Will document dreams if they continue.                                                                                                           [name]                                                  [name], PhD Time: 1:10p-2:00p 50 minutes

## 2023-04-24 ENCOUNTER — Ambulatory Visit: Payer: Medicare Other | Admitting: Psychology

## 2023-04-26 ENCOUNTER — Ambulatory Visit: Payer: Medicare Other | Attending: Internal Medicine | Admitting: *Deleted

## 2023-04-26 DIAGNOSIS — I480 Paroxysmal atrial fibrillation: Secondary | ICD-10-CM | POA: Diagnosis not present

## 2023-04-26 DIAGNOSIS — Z5181 Encounter for therapeutic drug level monitoring: Secondary | ICD-10-CM | POA: Insufficient documentation

## 2023-04-26 LAB — POCT INR: INR: 2.9 (ref 2.0–3.0)

## 2023-04-26 NOTE — Patient Instructions (Signed)
 Description   Continue taking warfarin 1 tablet daily except 1.5 tablets on Tuesdays, Thursdays and Saturdays. Recheck INR in 5 weeks.  Call with any medication changes or if you are scheduled for surgery  [facility] [phone] or [phone] Main [phone]

## 2023-05-07 ENCOUNTER — Ambulatory Visit: Payer: Medicare Other | Admitting: Psychology

## 2023-05-07 DIAGNOSIS — F3181 Bipolar II disorder: Secondary | ICD-10-CM | POA: Diagnosis not present

## 2023-05-07 DIAGNOSIS — F319 Bipolar disorder, unspecified: Secondary | ICD-10-CM

## 2023-05-07 NOTE — Progress Notes (Signed)
 Meredith [name] is a 73 y.o. female patient   Date: 05/07/2023  Treatment Plan: Diagnosis F31.81 (Bipolar II disorder) [n/a]  Symptoms Depressed or irritable mood. (Status: maintained) -- No Description Entered  Lack of energy. (Status: maintained) -- No Description Entered  Social withdrawal. (Status: maintained) -- No Description Entered  Medication Status compliance  Safety none  If Suicidal or Homicidal State Action Taken: unspecified  Current Risk: low Medications Abilify (Dosage: 30mg )  Hydocodone (Dosage: 7.5mg )  Lamotrigine (Dosage: 200mg /day)  Lithium (Dosage: 450mg /day)  Seroquel (Dosage: 200mg /day)  Objectives Related Problem: Develop healthy cognitive patterns and beliefs about self and the world that lead to alleviation and help prevent the relapse of mood episodes. Description: Verbalize grief, fear, and anger regarding real or imagined losses in life. Target Date: 2024-02-09 Frequency: Daily Modality: individual Progress: 85%  Related Problem: Develop healthy cognitive patterns and beliefs about self and the world that lead to alleviation and help prevent the relapse of mood episodes. Description: Take prescribed medications as directed. Target Date: 2022-02-08 Frequency: Daily Modality: individual Progress: 100%  Related Problem: Develop healthy cognitive patterns and beliefs about self and the world that lead to alleviation and help prevent the relapse of mood episodes. Description: Describe mood state, energy level, amount of control over  thoughts, and sleeping pattern. Target Date: 2024-02-09 Frequency: Daily Modality: individual Progress: 80%  Client Response full compliance  Service Location Location, [address] [place], [place] [address]  Service Code cpt [phone] P Neuropsych. testing  Related past to present  Self-monitoring  Self care activities  Emotion regulation skills  Validate/empathize  Facilitate problem solving  Normalize/Reframe  Journaling  Comments  Dx.: F31.81  Goals: States she is seeking emotional stability. She needs guidance in managing some of her physical/medical challenges and limitations. Also, have some interpersonal challenges and seeks feedback on addressing these difficulties.Revised target date is 12-25.   Meds: Seroquel (300mg  daily), Hydrocodone (7.5 mg up to 3x/day), Buspar (10mg  twice daily).   Patient agrees to be seen for a video Public affairs consultant) appointment and understands the limitations of this platform. She was at home and provider was in his office.  [name] was very confused at outset of the session. Claims she is "okay", but she appears disoriented. She  says that she "forgot about the visit" and she is just "unprepared". She says she doesn't feel well today and has stomach upset. Was able to track conversation, but less verbal. States that until today she has been feeling fine. Did not share much during session, and mostly responded to inquirey. Offered to discontinue session, but she declined. We talked mostly about self-care and the need for her to reach out to a medical professional for advice. She was reluctant, but ultimately agreed thyat if not feeling better soon, she will reach out before the end of the day.                                                                                                                [name]                                                  [name], PhD Time: 1:15p-2:00p 45 minutes

## 2023-05-08 ENCOUNTER — Ambulatory Visit: Payer: Medicare Other | Admitting: Psychology

## 2023-05-09 ENCOUNTER — Emergency Department (HOSPITAL_BASED_OUTPATIENT_CLINIC_OR_DEPARTMENT_OTHER)

## 2023-05-09 ENCOUNTER — Encounter (HOSPITAL_BASED_OUTPATIENT_CLINIC_OR_DEPARTMENT_OTHER): Payer: Self-pay | Admitting: *Deleted

## 2023-05-09 ENCOUNTER — Emergency Department (HOSPITAL_BASED_OUTPATIENT_CLINIC_OR_DEPARTMENT_OTHER)
Admission: EM | Admit: 2023-05-09 | Discharge: 2023-05-09 | Disposition: A | Discharge status: Home / Self Care | Attending: Emergency Medicine | Admitting: Emergency Medicine

## 2023-05-09 ENCOUNTER — Other Ambulatory Visit: Payer: Self-pay

## 2023-05-09 DIAGNOSIS — R197 Diarrhea, unspecified: Secondary | ICD-10-CM | POA: Diagnosis not present

## 2023-05-09 DIAGNOSIS — E039 Hypothyroidism, unspecified: Secondary | ICD-10-CM | POA: Insufficient documentation

## 2023-05-09 DIAGNOSIS — R109 Unspecified abdominal pain: Secondary | ICD-10-CM

## 2023-05-09 DIAGNOSIS — R6883 Chills (without fever): Secondary | ICD-10-CM | POA: Insufficient documentation

## 2023-05-09 DIAGNOSIS — Z79899 Other long term (current) drug therapy: Secondary | ICD-10-CM | POA: Diagnosis not present

## 2023-05-09 DIAGNOSIS — R110 Nausea: Secondary | ICD-10-CM | POA: Diagnosis not present

## 2023-05-09 DIAGNOSIS — R5383 Other fatigue: Secondary | ICD-10-CM | POA: Insufficient documentation

## 2023-05-09 DIAGNOSIS — Z7901 Long term (current) use of anticoagulants: Secondary | ICD-10-CM | POA: Insufficient documentation

## 2023-05-09 DIAGNOSIS — R1084 Generalized abdominal pain: Secondary | ICD-10-CM | POA: Insufficient documentation

## 2023-05-09 LAB — COMPREHENSIVE METABOLIC PANEL
ALT: 13 U/L (ref 0–44)
AST: 16 U/L (ref 15–41)
Albumin: 4.8 g/dL (ref 3.5–5.0)
Alkaline Phosphatase: 41 U/L (ref 38–126)
Anion gap: 14 (ref 5–15)
BUN: 23 mg/dL (ref 8–23)
CO2: 25 mmol/L (ref 22–32)
Calcium: 9.4 mg/dL (ref 8.9–10.3)
Chloride: 100 mmol/L (ref 98–111)
Creatinine, Ser: 0.9 mg/dL (ref 0.44–1.00)
GFR, Estimated: >60 mL/min (ref >60)
Glucose, Bld: 100 mg/dL — ABNORMAL HIGH (ref 70–99)
Potassium: 3.4 mmol/L — ABNORMAL LOW (ref 3.5–5.1)
Sodium: 139 mmol/L (ref 135–145)
Total Bilirubin: 0.8 mg/dL (ref 0.0–1.2)
Total Protein: 7.7 g/dL (ref 6.5–8.1)

## 2023-05-09 LAB — URINALYSIS, ROUTINE W REFLEX MICROSCOPIC
Bilirubin Urine: NEGATIVE
Glucose, UA: NEGATIVE mg/dL
Ketones, ur: 40 mg/dL — AB
Leukocytes,Ua: NEGATIVE
Nitrite: NEGATIVE
Protein, ur: 30 mg/dL — AB
Specific Gravity, Urine: 1.028 (ref 1.005–1.030)
pH: 6 (ref 5.0–8.0)

## 2023-05-09 LAB — LACTIC ACID, PLASMA: Lactic Acid, Venous: 1.1 mmol/L (ref 0.5–1.9)

## 2023-05-09 LAB — CBC
HCT: 42.5 % (ref 36.0–46.0)
Hemoglobin: 14.5 g/dL (ref 12.0–15.0)
MCH: 31 pg (ref 26.0–34.0)
MCHC: 34.1 g/dL (ref 30.0–36.0)
MCV: 90.8 fL (ref 80.0–100.0)
Platelets: 257 10*3/uL (ref 150–400)
RBC: 4.68 MIL/uL (ref 3.87–5.11)
RDW: 12.4 % (ref 11.5–15.5)
WBC: 14.5 10*3/uL — ABNORMAL HIGH (ref 4.0–10.5)
nRBC: 0 % (ref 0.0–0.2)

## 2023-05-09 LAB — RESP PANEL BY RT-PCR (RSV, FLU A&B, COVID)  RVPGX2
Influenza A by PCR: NEGATIVE
Influenza B by PCR: NEGATIVE
Resp Syncytial Virus by PCR: NEGATIVE
SARS Coronavirus 2 by RT PCR: NEGATIVE

## 2023-05-09 LAB — PROTIME-INR
INR: 1.9 — ABNORMAL HIGH (ref 0.8–1.2)
Prothrombin Time: 21.9 s — ABNORMAL HIGH (ref 11.4–15.2)

## 2023-05-09 LAB — LIPASE, BLOOD: Lipase: 17 U/L (ref 11–51)

## 2023-05-09 MED ORDER — FENTANYL CITRATE PF 50 MCG/ML IJ SOSY
50.0000 ug | PREFILLED_SYRINGE | Freq: Once | INTRAMUSCULAR | Status: AC
  Administered 2023-05-09: 50 ug via INTRAVENOUS
  Filled 2023-05-09: qty 1

## 2023-05-09 MED ORDER — ONDANSETRON HCL 4 MG/2ML IJ SOLN
4.0000 mg | Freq: Once | INTRAMUSCULAR | Status: AC
  Administered 2023-05-09: 4 mg via INTRAVENOUS
  Filled 2023-05-09: qty 2

## 2023-05-09 MED ORDER — IOHEXOL 300 MG/ML  SOLN
100.0000 mL | Freq: Once | INTRAMUSCULAR | Status: AC | PRN
  Administered 2023-05-09: 100 mL via INTRAVENOUS

## 2023-05-09 MED ORDER — ONDANSETRON 4 MG PO TBDP: 4.0000 mg | ORAL_TABLET | Freq: Three times a day (TID) | ORAL | 0 refills | Status: DC | PRN

## 2023-05-09 MED ORDER — HYDROCODONE-ACETAMINOPHEN 5-325 MG PO TABS: 1.0000 | ORAL_TABLET | ORAL | 0 refills | Status: DC | PRN

## 2023-05-09 MED ORDER — HYDROMORPHONE HCL 1 MG/ML IJ SOLN
0.5000 mg | Freq: Once | INTRAMUSCULAR | Status: AC
  Administered 2023-05-09: 0.5 mg via INTRAVENOUS
  Filled 2023-05-09: qty 1

## 2023-05-09 MED ORDER — SODIUM CHLORIDE 0.9 % IV BOLUS
1000.0000 mL | Freq: Once | INTRAVENOUS | Status: AC
  Administered 2023-05-09: 1000 mL via INTRAVENOUS

## 2023-05-09 NOTE — ED Provider Notes (Signed)
 [place] EMERGENCY DEPARTMENT AT [facility] Provider Note   CSN: [id number] Arrival date & time: 05/09/23  1458     History  Chief Complaint  Patient presents with   Abdominal Pain    Meredith [name] is a 73 y.o. female.  The history is provided by the patient and medical records. No language interpreter was used.  Abdominal Pain Pain location:  Generalized Pain quality: aching, bloating and cramping   Pain radiates to:  Does not radiate Pain severity:  Severe Onset quality:  Gradual Duration:  2 days Timing:  Constant Progression:  Worsening Chronicity:  New Context: not trauma   Relieved by:  Nothing Worsened by:  Nothing Ineffective treatments:  None tried Associated symptoms: chills, diarrhea, fatigue and nausea   Associated symptoms: no chest pain, no constipation, no cough, no dysuria, no fever, no flatus, no shortness of breath and no vomiting   Risk factors: has not had multiple surgeries        Home Medications Prior to Admission medications   Medication Sig Start Date End Date Taking? Authorizing Provider  alendronate (FOSAMAX) 35 MG tablet Take 35 mg by mouth once a week. 02/02/22   [provider]  Ascorbic Acid (VITAMIN C) 1000 MG tablet Take 1,000 mg by mouth daily.    [provider]  aspirin 325 MG tablet Take 325 mg by mouth daily as needed for headache.     [provider]  baclofen (LIORESAL) 10 MG tablet Take 1 tablet by mouth as needed.    [provider]  bisacodyl (DULCOLAX) 5 MG EC tablet PRN 03/18/20   [provider]  busPIRone (BUSPAR) 15 MG tablet Take 1 tablet (15 mg total) by mouth 2 (two) times daily. 01/28/23   Cottle, Steva Ready., MD  Cholecalciferol (VITAMIN D3) 125 MCG (5000 UT) TABS Take 1 tablet by mouth daily at 12 noon.    [provider]  clotrimazole (LOTRIMIN) 1 % cream as needed (skin irritation). 08/03/19   [provider]  docusate sodium (COLACE)  100 MG capsule Take 100 mg by mouth 2 (two) times daily.    [provider]  EPINEPHrine 0.3 mg/0.3 mL IJ SOAJ injection  07/07/19   [provider]  estradiol (ESTRACE) 1 MG tablet Take 1 mg by mouth daily. 01/27/19   [provider]  ferrous sulfate 325 (65 FE) MG tablet Take 325 mg by mouth 2 (two) times daily.    [provider]  furosemide (LASIX) 20 MG tablet Take 1 tablet (20 mg total) by mouth daily as needed. 03/09/22   Chandrasekhar, Mahesh A, MD  Galcanezumab-gnlm (EMGALITY) 120 MG/ML SOAJ Inject into the skin every 30 (thirty) days.    [provider]  HYDROcodone-acetaminophen (NORCO) 7.5-325 MG tablet Take 1 tablet by mouth 3 (three) times daily as needed for moderate pain or severe pain.    [provider]  ipratropium (ATROVENT) 0.03 % nasal spray 1-2 sprays in each nostril 03/08/20   [provider]  levocetirizine (XYZAL) 5 MG tablet Take 5 mg by mouth every evening. 07/07/19   [provider]  levothyroxine (SYNTHROID) 112 MCG tablet Take 112 mcg by mouth daily before breakfast.    [provider]  loperamide (IMODIUM) 2 MG capsule Take 1 capsule (2 mg total) by mouth as needed for diarrhea or loose stools. 07/20/19   Rhetta Mura, MD  Melatonin 5 MG TABS Take 20 mg by mouth at bedtime.  [provider]  metoprolol succinate (TOPROL-XL) 50 MG 24 hr tablet TAKE 1 TABLET BY MOUTH ONCE DAILY WITH  OR  IMMEDIATELY  FOLLOWING  A  MEAL 05/11/22   Chandrasekhar, Mahesh A, MD  Multiple Vitamin (MULTIVITAMIN WITH MINERALS) TABS tablet Take 1 tablet by mouth daily. 08/24/20   Edsel Petrin, DO  naloxone [facility]) 0.4 MG/ML injection  05/05/19   [provider]  ondansetron (ZOFRAN) 4 MG tablet Take 1 tablet (4 mg total) by mouth every 8 (eight) hours as needed for nausea or vomiting. 11/11/19   Park Liter, DPM  pantoprazole (PROTONIX) 40 MG tablet Take 1 tablet (40 mg total) by mouth  daily. 07/07/18   [place], Betty G, MD  pravastatin (PRAVACHOL) 20 MG tablet Take 20 mg by mouth daily. 10/22/17   [provider]  pregabalin (LYRICA) 150 MG capsule Take 150 mg by mouth 3 (three) times daily.    [provider]  QUEtiapine (SEROQUEL) 300 MG tablet Take 1 tablet (300 mg total) by mouth at bedtime. 01/28/23   Cottle, Steva Ready., MD  verapamil (VERELAN PM) 120 MG 24 hr capsule Take 1 capsule (120 mg total) by mouth at bedtime. Hold for low blood pressures 08/23/20   Mikhail, Harvest, DO  vitamin B-12 1000 MCG tablet Take 1 tablet (1,000 mcg total) by mouth daily. 08/24/20   Edsel Petrin, DO  warfarin (COUMADIN) 2.5 MG tablet TAKE 1 TO 1 & 1/2 (ONE TO ONE & ONE-HALF) TABLETS BY MOUTH ONCE DAILY AS DIRECTED BY  COUMADIN  CLINIC. 01/29/23   [name], Deloris Ping, MD      Allergies    Codeine, Adhesive [tape], Celebrex [celecoxib], Erythromycin, Other, Penicillamine, Sulfa antibiotics, Tricyclic antidepressants, Amoxicillin, Ampicillin, Cephalexin, Macrodantin [nitrofurantoin], and Penicillins    Review of Systems   Review of Systems  Constitutional:  Positive for chills and fatigue. Negative for diaphoresis and fever.  HENT:  Negative for congestion.   Respiratory:  Negative for cough, chest tightness and shortness of breath.   Cardiovascular:  Negative for chest pain.  Gastrointestinal:  Positive for abdominal pain, diarrhea and nausea. Negative for constipation, flatus and vomiting.  Genitourinary:  Negative for dysuria, flank pain and frequency.  Musculoskeletal:  Negative for back pain, neck pain and neck stiffness.  Skin:  Negative for rash and wound.  Neurological:  Negative for light-headedness and headaches.  Psychiatric/Behavioral:  Negative for agitation and confusion.   All other systems reviewed and are negative.   Physical Exam Updated Vital Signs BP (!) 158/96 (BP Location: Right Arm)   Pulse 64   Temp 97.8 F (36.6 C)   Resp 20   LMP  (LMP  Unknown)   SpO2 100%  Physical Exam Vitals and nursing note reviewed.  Constitutional:      General: She is not in acute distress.    Appearance: She is well-developed. She is not ill-appearing, toxic-appearing or diaphoretic.  HENT:     Head: Normocephalic and atraumatic.     Nose: No congestion or rhinorrhea.     Mouth/Throat:     Mouth: Mucous membranes are dry.     Pharynx: No oropharyngeal exudate.  Eyes:     Extraocular Movements: Extraocular movements intact.     Conjunctiva/sclera: Conjunctivae normal.  Cardiovascular:     Rate and Rhythm: Normal rate and regular rhythm.     Heart sounds: No murmur heard. Pulmonary:     Effort: Pulmonary effort is normal. No respiratory distress.     Breath  sounds: Normal breath sounds.  Abdominal:     General: Abdomen is flat. Bowel sounds are normal.     Palpations: Abdomen is soft.     Tenderness: There is generalized abdominal tenderness. There is no right CVA tenderness, left CVA tenderness, guarding or rebound.  Musculoskeletal:        General: No swelling or tenderness.     Cervical back: Neck supple.     Right lower leg: No edema.     Left lower leg: No edema.  Skin:    General: Skin is warm and dry.     Capillary Refill: Capillary refill takes less than 2 seconds.     Findings: No erythema or rash.  Neurological:     General: No focal deficit present.     Mental Status: She is alert.  Psychiatric:        Mood and Affect: Mood normal.     ED Results / Procedures / Treatments   Labs (all labs ordered are listed, but only abnormal results are displayed) Labs Reviewed  COMPREHENSIVE METABOLIC PANEL - Abnormal; Notable for the following components:      Result Value   Potassium 3.4 (*)    Glucose, Bld 100 (*)    All other components within normal limits  CBC - Abnormal; Notable for the following components:   WBC 14.5 (*)    All other components within normal limits  URINALYSIS, ROUTINE W REFLEX MICROSCOPIC -  Abnormal; Notable for the following components:   Hgb urine dipstick SMALL (*)    Ketones, ur 40 (*)    Protein, ur 30 (*)    Bacteria, UA RARE (*)    All other components within normal limits  PROTIME-INR - Abnormal; Notable for the following components:   Prothrombin Time 21.9 (*)    INR 1.9 (*)    All other components within normal limits  RESP PANEL BY RT-PCR (RSV, FLU A&B, COVID)  RVPGX2  LIPASE, BLOOD  LACTIC ACID, PLASMA    EKG EKG Interpretation Date/Time:  Thursday May 09 2023 15:18:19 EDT Ventricular Rate:  108 PR Interval:    QRS Duration:  76 QT Interval:  342 QTC Calculation: 458 R Axis:   37  Text Interpretation: Atrial fibrillation with rapid ventricular response with premature ventricular or aberrantly conducted complexes Abnormal ECG When compared with ECG of 16-Aug-2020 15:01, PREVIOUS ECG IS PRESENT when comapred to prior, now atrial flutter No STEMI Confirmed by Theda Belfast ([id number]) on 05/09/2023 3:29:09 PM  Radiology CT ABDOMEN PELVIS W CONTRAST Result Date: 05/09/2023 CLINICAL DATA:  Acute abdominal pain. Nausea, diarrhea. Abdominal distension. EXAM: CT ABDOMEN AND PELVIS WITH CONTRAST TECHNIQUE: Multidetector CT imaging of the abdomen and pelvis was performed using the standard protocol following bolus administration of intravenous contrast. RADIATION DOSE REDUCTION: This exam was performed according to the departmental dose-optimization program which includes automated exposure control, adjustment of the mA and/or kV according to patient size and/or use of iterative reconstruction technique. CONTRAST:  OMNIPAQUE IOHEXOL 300 MG/ML  SOLN COMPARISON:  CT 08/16/2020 FINDINGS: Lower chest: Clear lung bases. Hepatobiliary: Scattered tiny hepatic hypodensities are too small to characterize but likely cysts. No suspicious liver lesion. Mild hepatic steatosis. Gallbladder physiologically distended, no calcified stone. No biliary dilatation. Pancreas: No ductal  dilatation or inflammation. Spleen: Normal in size without focal abnormality. Adrenals/Urinary Tract: Normal adrenal glands. No hydronephrosis, renal calculi or suspicious renal lesion. Decompressed urinary bladder. Stomach/Bowel: Small hiatal hernia. No bowel obstruction or inflammation. Occasional colonic diverticula  without diverticulitis. Minimal formed stool in the colon. Normal appendix. Vascular/Lymphatic: Minimal aortic atherosclerosis. No acute vascular findings. Patent portal and splenic veins. No abdominopelvic adenopathy. Reproductive: Status post hysterectomy. No adnexal masses. Other: No free air or ascites.  No abdominal wall hernia. Musculoskeletal: Surgical hardware in the thoracic spine is partially included in the field of view. Lower lumbar degenerative change. There are no acute or suspicious osseous abnormalities. IMPRESSION: 1. No acute abnormality or explanation for abdominal pain. 2. Small hiatal hernia. Minimal colonic diverticulosis without diverticulitis. 3. Mild hepatic steatosis. Aortic Atherosclerosis (ICD10-I70.0). Electronically Signed   By: Narda Rutherford M.D.   On: 05/09/2023 19:07    Procedures Procedures    Medications Ordered in ED Medications  sodium chloride 0.9 % bolus 1,000 mL (0 mLs Intravenous Stopped 05/09/23 1753)  ondansetron (ZOFRAN) injection 4 mg (4 mg Intravenous Given 05/09/23 1651)  fentaNYL (SUBLIMAZE) injection 50 mcg (50 mcg Intravenous Given 05/09/23 1651)  iohexol (OMNIPAQUE) 300 MG/ML solution 100 mL (100 mLs Intravenous Contrast Given 05/09/23 1636)  HYDROmorphone (DILAUDID) injection 0.5 mg (0.5 mg Intravenous Given 05/09/23 1753)    ED Course/ Medical Decision Making/ A&P                                 Medical Decision Making Amount and/or Complexity of Data Reviewed Labs: ordered. Radiology: ordered.  Risk Prescription drug management.    Meredith [name] is a 73 y.o. female with a past medical history significant for  atrial fibrillation on Coumadin therapy, hypothyroidism, previous stroke, PTSD, osteoporosis, migraines, hyperlipidemia, and depression who presents with chills, nausea, diarrhea, abdominal pain, and abdominal distention.  According to patient, she has started feeling bad yesterday and then today was feeling worse.  She went to [facility] walk-in clinic and was told come to emergency department for further workup for her abdominal pain, distention, nausea, diarrhea, chills, and malaise.  The denies sick contacts.  She reports her abdominal pain is 8 out of 10 and "just hurts".  She cannot describe it further.  She denies any pain in her back and denies any urinary changes.  Denies any constipation but has had some loose stools and diarrhea.  She denies any blood in her stools.  Denies any vomiting without significant nausea.  She feels fatigued.  She denies any chest pain or shortness of breath at this time.  Does not report any cough.  Denies significant congestion.  Denies any vaginal complaints  On exam, lungs clear.  Chest nontender.  Abdomen tender to palpation.  I did hear bowel sounds.  Back and flanks nontender.  Mucous membranes are dry.  Patient moving all extremities.  Due to the patient's abdominal tenderness with chills, we will get a CT abdomen pelvis.  Will get screening labs.  Will add on viral testing as we have had people with influenza that have had similar GI symptoms.  Patient had initial labs that showed elevated white blood cell count.  CMP showed mild hypokalemia 3.4 but otherwise normal kidney function liver function.  Lipase not elevated.  Will wait for other viral testing, lactic, urinalysis, and an INR as she is on Coumadin.  She is in A-fib and says she does not feel when she is.  Will give fluids, pain medicine, nausea medicine.  Anticipate reassessment after imaging and workup to determine disposition.  8:04 PM Patient's workup did not show surgical problem in her abdomen or  pelvis.  Her labs showed a leukocytosis but otherwise was reassuring.  She says that urgent care said she might have norovirus and I do suspect she may have a viral infection as well.  She was negative for COVID/flu/RSV.  Her symptoms have improved now.  Vital signs did not show hypotension and she is feeling more comfortable now.  She reports she is tolerated Norco in the past we will give prescription for Norco and some nausea medicine and she will follow-up with her PCP.  She understood return precautions and follow-up instructions and was discharged in good condition after otherwise reassuring workup.           Final Clinical Impression(s) / ED Diagnoses Final diagnoses:  Diarrhea, unspecified type  Nausea  Abdominal pain, unspecified abdominal location    Rx / DC Orders ED Discharge Orders          Ordered    HYDROcodone-acetaminophen (NORCO/VICODIN) 5-325 MG tablet  Every 4 hours PRN        05/09/23 2000    ondansetron (ZOFRAN-ODT) 4 MG disintegrating tablet  Every 8 hours PRN        05/09/23 2000           Clinical Impression: 1. Diarrhea, unspecified type   2. Nausea   3. Abdominal pain, unspecified abdominal location     Disposition: Discharge  Condition: Good  I have discussed the results, Dx and Tx plan with the pt(& family if present). He/she/they expressed understanding and agree(s) with the plan. Discharge instructions discussed at great length. Strict return precautions discussed and pt &/or family have verbalized understanding of the instructions. No further questions at time of discharge.    New Prescriptions   HYDROCODONE-ACETAMINOPHEN (NORCO/VICODIN) 5-325 MG TABLET    Take 1 tablet by mouth every 4 (four) hours as needed.   ONDANSETRON (ZOFRAN-ODT) 4 MG DISINTEGRATING TABLET    Take 1 tablet (4 mg total) by mouth every 8 (eight) hours as needed for nausea or vomiting.    Follow Up: Soundra Pilon, FNP [phone] [place] [address] [phone]     [facility] Emergency Department at [facility] [address] [place] [address] [phone]       Siah Kannan, Canary Brim, MD 05/09/23 2005

## 2023-05-09 NOTE — ED Triage Notes (Signed)
 Pt was seen at [facility] walk in clinic pta due to abdominal pain and bloating which began this am and has been associated with watery diarrhea x5 today.

## 2023-05-09 NOTE — Discharge Instructions (Signed)
 Your history, exam, and workup today seem consistent with a viral infection causing her nausea, diarrhea, and abdominal discomfort.  Your workup was overall reassuring as we discussed and the CT scan did not show a surgical problem.  You are negative for COVID/flu/RSV but we feel you are safe for discharge home.  Please increase your hydration and use the pain and nausea medicine to help with symptoms.  Please follow-up with your primary doctor.  If any symptoms change or worsen acutely, please return to the nearest emergency department.

## 2023-05-21 ENCOUNTER — Ambulatory Visit: Admitting: Physician Assistant

## 2023-05-21 ENCOUNTER — Ambulatory Visit (INDEPENDENT_AMBULATORY_CARE_PROVIDER_SITE_OTHER): Payer: Medicare Other | Admitting: Psychology

## 2023-05-21 DIAGNOSIS — F319 Bipolar disorder, unspecified: Secondary | ICD-10-CM

## 2023-05-21 NOTE — Progress Notes (Signed)
 Meredith [name] is a 73 y.o. female patient   Date: 05/21/2023  Treatment Plan: Diagnosis F31.81 (Bipolar II disorder) [n/a]  Symptoms Depressed or irritable mood. (Status: maintained) -- No Description Entered  Lack of energy. (Status: maintained) -- No Description Entered  Social withdrawal. (Status: maintained) -- No Description Entered  Medication Status compliance  Safety none  If Suicidal or Homicidal State Action Taken: unspecified  Current Risk: low Medications Abilify (Dosage: 30mg )  Hydocodone (Dosage: 7.5mg )  Lamotrigine (Dosage: 200mg /day)  Lithium (Dosage: 450mg /day)  Seroquel (Dosage: 200mg /day)  Objectives Related Problem: Develop healthy cognitive patterns and beliefs about self and the world that lead to alleviation and help prevent the relapse of mood episodes. Description: Verbalize grief, fear, and anger regarding real or imagined losses in life. Target Date: 2024-02-09 Frequency: Daily Modality: individual Progress: 85%  Related Problem: Develop healthy cognitive patterns and beliefs about self and the world that lead to alleviation and help prevent the relapse of mood episodes. Description: Take prescribed medications as directed. Target Date: 2022-02-08 Frequency: Daily Modality: individual Progress: 100%  Related Problem: Develop healthy cognitive patterns and beliefs about self and the world that lead to alleviation and help prevent the relapse of mood episodes. Description: Describe mood state, energy  level, amount of control over thoughts, and sleeping pattern. Target Date: 2024-02-09 Frequency: Daily Modality: individual Progress: 80%  Client Response full compliance  Service Location Location, [address] [place], [place] [address]  Service Code cpt [phone] P Neuropsych. testing  Related past to present  Self-monitoring  Self care activities  Emotion regulation skills  Validate/empathize  Facilitate problem solving  Normalize/Reframe  Journaling  Comments  Dx.: F31.81  Goals: States she is seeking emotional stability. She needs guidance in managing some of her physical/medical challenges and limitations. Also, have some interpersonal challenges and seeks feedback on addressing these difficulties.Revised target date is 12-25.   Meds: Seroquel (300mg  daily), Hydrocodone (7.5 mg up to 3x/day), Buspar (10mg  twice daily).   Patient agrees to be seen for a video Public affairs consultant) appointment and understands the limitations of this platform. She was at home and provider was in his office.  Meredith [name] went to  the doctor after our last visit and she ended up at the ER. They did lots of tests and determined that she had diverticula and a hernia. She also was diagnosed with Afib and has a follow-up with a cardiologist scheduled. Will also see Dr. Jennelle Human next month. She is recovering and has managed to still get a lot done, even though she was sick. States moods have been relatively stable even though she was feeling bad.                                                                                                                     [name], PhD Time: 1:15p-2:00p 45 minutes

## 2023-05-22 ENCOUNTER — Ambulatory Visit: Payer: Medicare Other | Admitting: Psychology

## 2023-05-31 ENCOUNTER — Ambulatory Visit: Payer: Medicare Other | Attending: Cardiovascular Disease

## 2023-05-31 DIAGNOSIS — I480 Paroxysmal atrial fibrillation: Secondary | ICD-10-CM | POA: Diagnosis present

## 2023-05-31 DIAGNOSIS — Z5181 Encounter for therapeutic drug level monitoring: Secondary | ICD-10-CM | POA: Insufficient documentation

## 2023-05-31 LAB — POCT INR: INR: 3.3 — AB (ref 2.0–3.0)

## 2023-05-31 NOTE — Patient Instructions (Signed)
 Take 1 tablet Saturday only then Continue taking warfarin 1 tablet daily except 1.5 tablets on Tuesdays, Thursdays and Saturdays. Recheck INR in 5 weeks.  Call with any medication changes or if you are scheduled for surgery  Coumadin Clinic [phone] or [phone] Main [phone]

## 2023-06-03 ENCOUNTER — Ambulatory Visit: Payer: Medicare Other | Admitting: Psychiatry

## 2023-06-03 ENCOUNTER — Encounter: Payer: Self-pay | Admitting: Psychiatry

## 2023-06-03 DIAGNOSIS — F4310 Post-traumatic stress disorder, unspecified: Secondary | ICD-10-CM | POA: Diagnosis not present

## 2023-06-03 DIAGNOSIS — Z8669 Personal history of other diseases of the nervous system and sense organs: Secondary | ICD-10-CM

## 2023-06-03 DIAGNOSIS — F4001 Agoraphobia with panic disorder: Secondary | ICD-10-CM

## 2023-06-03 DIAGNOSIS — F411 Generalized anxiety disorder: Secondary | ICD-10-CM | POA: Diagnosis not present

## 2023-06-03 DIAGNOSIS — F319 Bipolar disorder, unspecified: Secondary | ICD-10-CM | POA: Diagnosis not present

## 2023-06-03 DIAGNOSIS — G3184 Mild cognitive impairment, so stated: Secondary | ICD-10-CM

## 2023-06-03 DIAGNOSIS — F5105 Insomnia due to other mental disorder: Secondary | ICD-10-CM

## 2023-06-03 NOTE — Progress Notes (Unsigned)
 Cardiology Office Note:  .   Date:  06/04/2023  ID:  Meredith [name], DOB Feb 01, 1951, MRN [id number] PCP: Meredith Pilon, FNP  [facility] Providers Cardiologist:  Meredith Constant, MD Electrophysiologist:  Meredith Prude, MD  History of Present Illness: .   Meredith [name] is a 73 y.o. female with a past medical history of PAF status post 3 ablations (last cryoablation 2018), bipolar on chronic lithium, prior acute fall with no bleeding issues and maintained on Coumadin who was seen January 2024.  Reestablished care in 2023.  Patient was doing okay at her last office visit.  Having some INR is up to 4 but regularly following with the Coumadin clinic.  No chest pain or pressure.  No SOB/DOE.  No PND/orthopnea.  No weight gain or swelling.  No syncope or palpitations.  Last AF episode was 2022.  Usually symptomatic when she is in A-fib.  Recently seen in the ED in March 2025 with abdominal pain and diarrhea.  She was found to be in atrial fibrillation.  Today, she has a with a history of atrial fibrillation and three previous ablations, presents with a return of atrial flutter. The patient reports feeling anxious due to the flutter. She also has some SOB. She was recently in the emergency room twice due to norovirus, and the last time had a reoccurrence of her Afib. The patient's last ablation was approximately nine to ten years ago, and she has not had any issues with atrial fibrillation since then until now. The patient's metoprolol dosage has been increased to 100 milligrams. The patient also reports swelling in the ankles and a rash on the legs, which she believes may be due to an allergic reaction from a recent pedicure. The patient is scheduled for a radiofrequency procedure on the back in the near future. We do not have a clearance request for this procedure and we encouraged the patient to reach out if clearance is needed.   Reports no shortness of breath nor dyspnea  on exertion. Reports no chest pain, pressure, or tightness. No edema, orthopnea, PND. Reports no palpitations.   Discussed the use of AI scribe software for clinical note transcription with the patient, who gave verbal consent to proceed.  ROS: pertinent ROS in HPI  Studies Reviewed: .       Echo 08/18/20 IMPRESSIONS     1. Left ventricular ejection fraction, by estimation, is 60 to 65%. The  left ventricle has normal function. The left ventricle has no regional  wall motion abnormalities. Left ventricular diastolic function could not  be evaluated.   2. Right ventricular systolic function is normal. The right ventricular  size is normal. There is mildly elevated pulmonary artery systolic  pressure.   3. Left atrial size was mildly dilated.   4. The mitral valve is normal in structure. Mild mitral valve  regurgitation. No evidence of mitral stenosis.   5. The aortic valve is tricuspid. Aortic valve regurgitation is mild. No  aortic stenosis is present.   FINDINGS   Left Ventricle: Left ventricular ejection fraction, by estimation, is 60  to 65%. The left ventricle has normal function. The left ventricle has no  regional wall motion abnormalities. The left ventricular internal cavity  size was normal in size. There is   no left ventricular hypertrophy. Left ventricular diastolic function  could not be evaluated due to atrial fibrillation. Left ventricular  diastolic function could not be evaluated.   Right Ventricle: The  right ventricular size is normal.Right ventricular  systolic function is normal. There is mildly elevated pulmonary artery  systolic pressure. The tricuspid regurgitant velocity is 3.08 m/s, and  with an assumed right atrial pressure of   3 mmHg, the estimated right ventricular systolic pressure is 40.9 mmHg.   Left Atrium: Left atrial size was mildly dilated.   Right Atrium: Right atrial size was normal in size.   Pericardium: There is no evidence of  pericardial effusion.   Mitral Valve: The mitral valve is normal in structure. Mild mitral valve  regurgitation. No evidence of mitral valve stenosis.   Tricuspid Valve: The tricuspid valve is normal in structure. Tricuspid  valve regurgitation is mild . No evidence of tricuspid stenosis.   Aortic Valve: The aortic valve is tricuspid. Aortic valve regurgitation is  mild. No aortic stenosis is present.   Pulmonic Valve: The pulmonic valve was normal in structure. Pulmonic valve  regurgitation is not visualized. No evidence of pulmonic stenosis.   Aorta: The aortic root is normal in size and structure.   Venous: The inferior vena cava was not well visualized.   IAS/Shunts: The interatrial septum was not well visualized.    Risk Assessment/Calculations:    CHA2DS2-VASc Score = 2   This indicates a 2.2% annual risk of stroke. The patient's score is based upon: CHF History: 0 HTN History: 0 Diabetes History: 0 Stroke History: 0 Vascular Disease History: 0 Age Score: 1 Gender Score: 1             Physical Exam:   VS:  BP 118/70   Pulse 64   Ht 5' (1.524 m)   Wt 187 lb (84.8 kg)   LMP  (LMP Unknown)   SpO2 96%   BMI 36.52 kg/m    Wt Readings from Last 3 Encounters:  06/04/23 187 lb (84.8 kg)  03/15/23 187 lb (84.8 kg)  05/28/22 189 lb (85.7 kg)    GEN: Well nourished, well developed in no acute distress NECK: No JVD; No carotid bruits CARDIAC: RRR, no murmurs, rubs, gallops RESPIRATORY:  Clear to auscultation without rales, wheezing or rhonchi  ABDOMEN: Soft, non-tender, non-distended EXTREMITIES:  No edema; No deformity   ASSESSMENT AND PLAN: .   Atrial Flutter/Afib Experiencing atrial flutter with controlled heart rate due to metoprolol. Possible electrolyte imbalance post-norovirus infection. Potassium at 3.3 mEq/L. History of three ablations, last 9-10 years ago. Managed with metoprolol, pravastatin, and Coumadin. Recent Coumadin adjustments due to high INR.  Further evaluation by electrophysiology specialist needed. - Order basic metabolic panel and magnesium level. - Refer to electrophysiology specialist for urgent evaluation and potential repeat ablation. - Discuss potassium replacement if levels are low.  Fluid Retention Ankle swelling likely due to fluid retention. Not currently taking Lasix. - Refill Lasix 20 mg PRN for fluid retention. Monitor weight and ankle swelling. - Advise leg elevation to reduce swelling.  Allergic Reaction Rash on legs likely from allergic reaction to pedicure product. Rash not itchy. - Recommend over-the-counter cortisone cream if rash becomes itchy.  Follow-up Requires follow-up with electrophysiology specialist for atrial flutter. Scheduled for radiofrequency procedure on back, may need cardiac clearance. - Coordinate urgent referral to electrophysiology specialist. - Instruct to contact facility for radiofrequency procedure to confirm cardiac clearance requirement.       Dispo: She can see Dr. Izora Ribas in 6 months  Signed, Sharlene Dory, PA-C

## 2023-06-03 NOTE — Progress Notes (Signed)
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 diarrhea/vomiting Other reaction(s): Unknown   Penicillamine Diarrhea    Other reaction(s): Vomiting   Sulfa Antibiotics Hives   Tricyclic Antidepressants     Doesn't help   Amoxicillin Rash   Ampicillin Rash   Cephalexin Diarrhea   Macrodantin [Nitrofurantoin] Rash   Penicillins Rash    Did it involve swelling of the face/tongue/throat, SOB, or low BP? Yes Did it involve sudden or severe rash/hives, skin peeling, or any reaction on the inside of your mouth or nose? NO Did you need to seek medical attention at a hospital or doctor's office? NO When did it last happen? childhood       If all above answers are "NO", may proceed with cephalosporin use.    Past Medical History:  Diagnosis Date   Anxiety    Atrial fibrillation (HCC)    Atrial fibrillation (HCC)    Bipolar 2 disorder (HCC)    Chronic kidney disease    Depression    GERD (gastroesophageal reflux disease)    Hematoma 07/06/2020   History of cardioversion    x2   History of cardioversion    Hyperlipidemia    Hypothyroidism    Ingrown toenail 10/10/2020   Migraine headache    Osteomyelitis of foot (HCC)  11/11/2020   Osteoporosis    Pre-diabetes    Septic arthritis of interphalangeal joint of toe of right foot (HCC) 07/06/2020   Neg sleep study 4 years ago Family History  Problem Relation Age of Onset   Arthritis Mother    Depression Mother    Diabetes Mother    Heart disease Mother    Stroke Mother    Early death Father    Heart disease Father    Atrial fibrillation Brother    Hyperlipidemia Brother    Multiple sclerosis Sister    Alcohol abuse Brother    Asthma Brother    Drug abuse Brother  Hypertension Brother    Mental illness Brother     Social History   Socioeconomic History   Marital status: Divorced    Spouse name: Not on file   Number of children: 3   Years of education: Not on file   Highest education level: Not on file  Occupational History   Occupation: Retired  Tobacco Use   Smoking status: Never   Smokeless tobacco: Never  Vaping Use   Vaping status: Not on file  Substance and Sexual Activity   Alcohol use: Not Currently   Drug use: No   Sexual activity: Not Currently  Other Topics Concern   Not on file  Social History Narrative   Not on file   Social Drivers of Health   Financial Resource Strain: Not on file  Food Insecurity: Not on file  Transportation Needs: Not on file  Physical Activity: Not on file  Stress: Not on file  Social Connections: Not on file  Intimate Partner Violence: Not on file    Past Medical History, Surgical history, Social history, and Family history were reviewed and updated as appropriate.   ADOPTED but has twin brother.  Please see review of systems for further details on the patient's review from today.   Objective:   Physical Exam:  LMP  (LMP Unknown)   Physical Exam Constitutional:      General: She is not in acute distress.    Appearance: She is well-developed. She is obese.  Musculoskeletal:        General: No deformity.  Neurological:     Mental Status: She is alert and oriented to person,  place, and time.     Motor: No weakness.     Coordination: Coordination normal.     Gait: Gait abnormal.     Comments: cane with good pace in gait   Psychiatric:        Attention and Perception: Perception normal. She does not perceive auditory or visual hallucinations.        Mood and Affect: Mood is anxious. Mood is not depressed. Affect is not labile, blunt, angry, tearful or inappropriate.        Speech: Speech normal. Speech is not rapid and pressured or slurred.        Behavior: Behavior normal. Behavior is not agitated.        Thought Content: Thought content normal. Thought content is not delusional. Thought content does not include homicidal or suicidal ideation. Thought content does not include suicidal plan.        Cognition and Memory: Cognition is not impaired. She exhibits impaired recent memory.     Comments: Insight and judgment fair No delusions.  Cognition appears baseline but she feels it's impaired Neat and pleasant Talkative without pressure. Somatic focus.     Lab Review:     Component Value Date/Time   NA 139 05/09/2023 1521   NA 141 12/07/2019 1022   K 3.4 (L) 05/09/2023 1521   CL 100 05/09/2023 1521   CO2 25 05/09/2023 1521   GLUCOSE 100 (H) 05/09/2023 1521   BUN 23 05/09/2023 1521   BUN 15 12/07/2019 1022   CREATININE 0.90 05/09/2023 1521   CREATININE 1.02 (H) 11/11/2020 1116   CALCIUM 9.4 05/09/2023 1521   PROT 7.7 05/09/2023 1521   ALBUMIN 4.8 05/09/2023 1521   AST 16 05/09/2023 1521   ALT 13 05/09/2023 1521   ALKPHOS 41 05/09/2023 1521   BILITOT 0.8 05/09/2023 1521   GFRNONAA >60 05/09/2023  1521   GFRNONAA 61 09/02/2020 1552   GFRAA 71 09/02/2020 1552       Component Value Date/Time   WBC 14.5 (H) 05/09/2023 1521   RBC 4.68 05/09/2023 1521   HGB 14.5 05/09/2023 1521   HGB 13.9 03/13/2021 1414   HCT 42.5 05/09/2023 1521   HCT 40.0 03/13/2021 1414   PLT 257 05/09/2023 1521   PLT 193 03/13/2021 1414   MCV 90.8 05/09/2023 1521   MCV 87  03/13/2021 1414   MCH 31.0 05/09/2023 1521   MCHC 34.1 05/09/2023 1521   RDW 12.4 05/09/2023 1521   RDW 15.0 03/13/2021 1414   LYMPHSABS 1.1 03/15/2023 1139   MONOABS 0.8 03/15/2023 1139   EOSABS 0.2 03/15/2023 1139   BASOSABS 0.0 03/15/2023 1139    Lithium Lvl  Date Value Ref Range Status  08/18/2020 0.17 (L) 0.60 - 1.20 mmol/L Final    Comment:    Performed at [facility], [address] [place], [place] [address]    May 20, 2019 lithium level 0.9 on 450 mg daily and creatinine was 1.1 with normal calcium 07/21/19 lithium 0.4 on 450 mg daily, normal CMP  No results found for: "PHENYTOIN", "PHENOBARB", "VALPROATE", "CBMZ"   .res Assessment: Plan:     Verdelle was seen today for follow-up, depression, anxiety, memory loss and sleeping problem.  Diagnoses and all orders for this visit:  Bipolar affective disorder, rapid cycling (HCC)  Generalized anxiety disorder  Panic disorder with agoraphobia  PTSD (post-traumatic stress disorder)  Mild cognitive impairment  Insomnia due to mental condition  History of encephalopathy   History of lithium toxicity Hx repeated bouts of encephalopathy  30 min face to face time with patient was spent on counseling and coordination of care. We discussed the following.  Victoriya is a chronically mentally ill patient with chronic depression and chronic anxiety and multiple med failures.  Overall is more stable psychiatrically on lower-dose quetiapine than in the past  .    [facility] for acute encephalopathy 07/2020.  The cause was unknown but presumed to be medication related.   Cognition appears stable in last couple of years.  .  Using pillbox to help compliance.  Disc danger of mixing up or doubling up meds.  She fills box herself.  Rec she get help with this. Cannot afford branded meds which prevents [place] from using some of the bipolar depression meds like Vraylar.  Few options for depression and anxiety, Option Trileptal not as  good for depression. therefore increased Seroquel to 300 mg HS  has doing ok with this so far. Caution re: sedative risks emphasized and fall risk.   Afraid of going lower in Seroquel bc fear of relapse as it is the main mood stabilizer.    Push fluids.  Has to remind herself. Emphasized again.  Says she's doing well with this.  Disc this again to try to reduce risk recurrent dehydration and fall risk.    Discussed potential metabolic side effects associated with atypical antipsychotics, as well as potential risk for movement side effects. Advised pt to contact office if movement side effects occur.  Also disc risk quetiapine contributing to orthostasis.   OK Ativan prn for dental procedure 1-2 mg.  No driving after it.  PCP Monia Pouch, Meridee Score  Continue therapy with Dr. Dellia Cloud q 2 weeks.  Supportive therapy dealing with ill friends and chronic health issues   No med changes indicated  Current psych meds Seroquel 300  HS are preventing mood cycling and helping depression Continue buspirone 15 BID helped anxiety.  Follow-up  2-4  mos.  She wants to have frequent follow up appts bc some chronic instability.  Meredith Staggers MD, DFAPA  Please see After Visit Summary for patient specific instructions.  Future Appointments  Date Time Provider Department Center  06/04/2023 10:30 AM Sharlene Dory, [place] [facility]  06/04/2023  1:00 PM Haze Rushing, PhD [facility] None  06/18/2023  1:00 PM Haze Rushing, PhD [facility] None  07/02/2023  1:00 PM Haze Rushing, PhD [facility] None  07/05/2023  1:15 PM [facility] None  07/16/2023  1:00 PM Haze Rushing, PhD [facility] None  07/30/2023  1:00 PM Haze Rushing, PhD [facility] None    No orders of the defined types were placed in this encounter.      -------------------------------

## 2023-06-04 ENCOUNTER — Telehealth: Payer: Self-pay | Admitting: *Deleted

## 2023-06-04 ENCOUNTER — Encounter: Payer: Self-pay | Admitting: Physician Assistant

## 2023-06-04 ENCOUNTER — Ambulatory Visit (INDEPENDENT_AMBULATORY_CARE_PROVIDER_SITE_OTHER): Payer: Medicare Other | Admitting: Psychology

## 2023-06-04 ENCOUNTER — Ambulatory Visit: Attending: Physician Assistant | Admitting: Physician Assistant

## 2023-06-04 VITALS — BP 118/70 | HR 64 | Ht 60.0 in | Wt 187.0 lb

## 2023-06-04 DIAGNOSIS — Z5181 Encounter for therapeutic drug level monitoring: Secondary | ICD-10-CM | POA: Insufficient documentation

## 2023-06-04 DIAGNOSIS — I480 Paroxysmal atrial fibrillation: Secondary | ICD-10-CM | POA: Diagnosis not present

## 2023-06-04 DIAGNOSIS — F3181 Bipolar II disorder: Secondary | ICD-10-CM | POA: Diagnosis not present

## 2023-06-04 DIAGNOSIS — I4892 Unspecified atrial flutter: Secondary | ICD-10-CM | POA: Diagnosis present

## 2023-06-04 DIAGNOSIS — R600 Localized edema: Secondary | ICD-10-CM | POA: Insufficient documentation

## 2023-06-04 DIAGNOSIS — F319 Bipolar disorder, unspecified: Secondary | ICD-10-CM

## 2023-06-04 MED ORDER — FUROSEMIDE 20 MG PO TABS: 20.0000 mg | ORAL_TABLET | Freq: Every day | ORAL | 11 refills | Status: AC | PRN

## 2023-06-04 NOTE — Progress Notes (Signed)
    Meredith [name] is a 73 y.o. female patient   Date: 06/04/2023  Treatment Plan: Diagnosis F31.81 (Bipolar II disorder) [n/a]  Symptoms Depressed or irritable mood. (Status: maintained) -- No Description Entered  Lack of energy. (Status: maintained) -- No Description Entered  Social withdrawal. (Status: maintained) -- No Description Entered  Medication Status compliance  Safety none  If Suicidal or Homicidal State Action Taken: unspecified  Current Risk: low Medications Abilify (Dosage: 30mg )  Hydocodone (Dosage: 7.5mg )  Lamotrigine (Dosage: 200mg /day)  Lithium (Dosage: 450mg /day)  Seroquel (Dosage: 200mg /day)  Objectives Related Problem: Develop healthy cognitive patterns and beliefs about self and the world that lead to alleviation and help prevent the relapse of mood episodes. Description: Verbalize grief, fear, and anger regarding real or imagined losses in life. Target Date: 2024-02-09 Frequency: Daily Modality: individual Progress: 85%  Related Problem: Develop healthy cognitive patterns and beliefs about self and the world that lead to alleviation and help prevent the relapse of mood episodes. Description: Take prescribed medications as directed. Target Date: 2022-02-08 Frequency: Daily Modality: individual Progress: 100%  Related Problem: Develop healthy cognitive patterns and beliefs about self and the world that lead to alleviation and help prevent the relapse of mood episodes. Description: Describe mood state, energy level, amount of control over thoughts, and sleeping pattern. Target Date: 2024-02-09 Frequency: Daily Modality: individual Progress: 80%  Client Response full compliance  Service Location Location, [address] [name], [place] [address]  Service Code cpt [phone] P Neuropsych. testing  Related past to present  Self-monitoring  Self care activities  Emotion regulation skills  Validate/empathize  Facilitate problem solving   Normalize/Reframe  Journaling  Comments  Dx.: F31.81  Goals: States she is seeking emotional stability. She needs guidance in managing some of her physical/medical challenges and limitations. Also, have some interpersonal challenges and seeks feedback on addressing these difficulties.Revised target date is 12-25.   Meds: Seroquel (300mg  daily), Hydrocodone (7.5 mg up to 3x/day), Buspar (10mg  twice daily).   Patient agrees to be seen for a video Public affairs consultant) appointment and understands the limitations of this platform. She was at home and provider was in his office.  Meredith [name] says she went to cardiologist this morning. The PA did EKG and her Afib is constant and she needs to see an electrophysiologist. She will go in tomorrow to find out what interventions they propose. She saw Dr. Jennelle Human yesterday. He did not change any medications, but they did discuss changing her Seroquel to a time release version. She is pleased that she was able to get to church over the weekend and she is hoping to go Easter Sunday. She is busy with medical ap[pointments, but relieved issues are being addressed.         [name]                                                  [name], PhD Time: 1:15p-2:00p 45 minutes

## 2023-06-04 NOTE — Patient Instructions (Addendum)
 Medication Instructions:   Your physician recommends that you continue on your current medications as directed. Please refer to the Current Medication list given to you today.  *If you need a refill on your cardiac medications before your next appointment, please call your pharmacy*  Lab Work:  TODAY--DOWNSTAIRS FIRST FLOOR AT [facility]--BMET AND MAGNESIUM LEVEL  If you have labs (blood work) drawn today and your tests are completely normal, you will receive your results only by: MyChart Message (if you have MyChart) OR A paper copy in the mail If you have any lab test that is abnormal or we need to change your treatment, we will call you to review the results.    Follow-Up:  WITH DR. Nelly Laurence ELECTROPHYSIOLOGIST--TOMORROW WED 06/05/23 AT 2:45 PM--PLEASE ARRIVE 10-15 MINS PRIOR TO THIS OFFICE VISIT APPOINTMENT--THIS WILL BE HERE IN THE SAME OFFICE AS TODAY        1st Floor: - Lobby - Registration  - Pharmacy  - Lab - Cafe  2nd Floor: - PV Lab - Diagnostic Testing (echo, CT, nuclear med)  3rd Floor: - Vacant  4th Floor: - TCTS (cardiothoracic surgery) - AFib Clinic - Structural Heart Clinic - Vascular Surgery  - Vascular Ultrasound  5th Floor: - [facility] (general and EP) - Clinical Pharmacy for coumadin, hypertension, lipid, weight-loss medications, and med management appointments    Valet parking services will be available as well.

## 2023-06-04 NOTE — Progress Notes (Signed)
 Chart was reviewed and pt was complaining of malaise and chills and given the pandemic, a viral test was ordered.

## 2023-06-04 NOTE — Telephone Encounter (Signed)
 FYI

## 2023-06-04 NOTE — Telephone Encounter (Signed)
 Pt was seen in the clinic today by Jari Favre PA-C.  Per Jari Favre PA-C, pt to switch from Dr. Lalla Brothers in EP to Dr. Nelly Laurence, due to the need to be seen ASAP for symptomatic a-flutter.  Pt is now scheduled to see Dr. Nelly Laurence tomorrow 4/9 and 2:45 pm, for symptomatic aflutter.

## 2023-06-05 ENCOUNTER — Other Ambulatory Visit: Payer: Self-pay

## 2023-06-05 ENCOUNTER — Encounter: Payer: Self-pay | Admitting: Cardiovascular Disease

## 2023-06-05 ENCOUNTER — Ambulatory Visit: Payer: Medicare Other | Admitting: Psychology

## 2023-06-05 ENCOUNTER — Ambulatory Visit: Attending: Cardiovascular Disease | Admitting: Cardiovascular Disease

## 2023-06-05 VITALS — BP 128/71 | HR 77 | Ht 60.0 in | Wt 191.0 lb

## 2023-06-05 DIAGNOSIS — I4892 Unspecified atrial flutter: Secondary | ICD-10-CM | POA: Diagnosis present

## 2023-06-05 DIAGNOSIS — I480 Paroxysmal atrial fibrillation: Secondary | ICD-10-CM

## 2023-06-05 LAB — BASIC METABOLIC PANEL WITH GFR
BUN/Creatinine Ratio: 12 (ref 12–28)
BUN: 11 mg/dL (ref 8–27)
CO2: 24 mmol/L (ref 20–29)
Calcium: 9.3 mg/dL (ref 8.7–10.3)
Chloride: 102 mmol/L (ref 96–106)
Creatinine, Ser: 0.94 mg/dL (ref 0.57–1.00)
Glucose: 85 mg/dL (ref 70–99)
Potassium: 4.8 mmol/L (ref 3.5–5.2)
Sodium: 143 mmol/L (ref 134–144)
eGFR: 64 mL/min/{1.73_m2} (ref >59)

## 2023-06-05 LAB — MAGNESIUM: Magnesium: 2.2 mg/dL (ref 1.6–2.3)

## 2023-06-05 MED ORDER — APIXABAN 5 MG PO TABS: 5.0000 mg | ORAL_TABLET | Freq: Two times a day (BID) | ORAL | Status: DC

## 2023-06-05 NOTE — Patient Instructions (Addendum)
 Medication Instructions:  Patient assistance paperwork is provided to see if you qualify for the eliquis assistance, if you do Dr Nelly Laurence would like to switch you over from your warfarin to eliquis. Our office can provide 1 month worth of samples for you at that time. Please contact our office for samples and prescription once you find out if you qualify  *If you need a refill on your cardiac medications before your next appointment, please call your pharmacy*  Lab Work: CBC and BMET - please have pre-procedure lab work completed on Wednesday, May 28 (a day or two after is fine as well) . This can be done at ANY [facility] near you - no appointment required and this does not have to be fasting. If you have labs (blood work) drawn today and your tests are completely normal, you will receive your results only by: MyChart Message (if you have MyChart) OR A paper copy in the mail If you have any lab test that is abnormal or we need to change your treatment, we will call you to review the results.  Testing/Procedures: Cardiac CT - someone will contact you to schedule this  Your physician has requested that you have cardiac CT. Cardiac computed tomography (CT) is a painless test that uses an x-ray machine to take clear, detailed pictures of your heart. For further information please visit [email or web address] Please follow instruction sheet as given.   Atrial Fibrillation Ablation - scheduled on Friday, June 27. We will be in contact closer to your ablation date with further instructions Your physician has recommended that you have an ablation. Catheter ablation is a medical procedure used to treat some cardiac arrhythmias (irregular heartbeats). During catheter ablation, a long, thin, flexible tube is put into a blood vessel in your groin (upper thigh), or neck. This tube is called an ablation catheter. It is then guided to your heart through the blood vessel. Radio frequency waves destroy small areas of  heart tissue where abnormal heartbeats may cause an arrhythmia to start. Please see the instruction sheet given to you today.  Follow-Up: At [facility], you and your health needs are our priority.  As part of our continuing mission to provide you with exceptional heart care, our providers are all part of one team.  This team includes your primary Cardiologist (physician) and Advanced Practice Providers or APPs (Physician Assistants and Nurse Practitioners) who all work together to provide you with the care you need, when you need it.  Your next appointment:   We will schedule follow up after your ablation  Provider:   York Pellant, MD   Cardiac Ablation Cardiac ablation is a procedure to destroy, or ablate, a small amount of heart tissue that is causing problems. The heart has many electrical connections. Sometimes, these connections are abnormal and can cause the heart to beat very fast or irregularly. Ablating the abnormal areas can improve the heart's rhythm or return it to normal. Ablation may be done for people who: Have irregular or rapid heartbeats (arrhythmias). Have Wolff-Parkinson-White syndrome. Have taken medicines for an arrhythmia that did not work or caused side effects. Have a high-risk heartbeat that may be life-threatening. Tell a health care provider about: Any allergies you have. All medicines you are taking, including vitamins, herbs, eye drops, creams, and over-the-counter medicines. Any problems you or family members have had with anesthesia. Any bleeding problems you have. Any surgeries you have had. Any medical conditions you have. Whether you are pregnant or may be  pregnant. What are the risks? Your health care provider will talk with you about risks. These may include: Infection. Bruising and bleeding. Stroke or blood clots. Damage to nearby structures or organs. Allergic reaction to medicines or dyes. Needing a pacemaker if the heart gets  damaged. A pacemaker is a device that helps the heart beat normally. Failure of the procedure. A repeat procedure may be needed. What happens before the procedure? Medicines Ask your health care provider about: Changing or stopping your regular medicines. These include any heart rhythm medicines, diabetes medicines, or blood thinners you take. Taking medicines such as aspirin and ibuprofen. These medicines can thin your blood. Do not take them unless your health care provider tells you to. Taking over-the-counter medicines, vitamins, herbs, and supplements. General instructions Follow instructions from your health care provider about what you may eat and drink. If you will be going home right after the procedure, plan to have a responsible adult: Take you home from the hospital or clinic. You will not be allowed to drive. Care for you for the time you are told. Ask your health care provider what steps will be taken to prevent infection. What happens during the procedure?  An IV will be inserted into one of your veins. You may be given: A sedative. This helps you relax. Anesthesia. This will: Numb certain areas of your body. An incision will be made in your neck or your groin. A needle will be inserted through the incision and into a large vein in your neck or groin. The small, thin tube (catheter) will be inserted through the needle and moved to your heart. A type of X-ray (fluoroscopy) will be used to help guide the catheter and provide images of the heart on a monitor. Dye may be injected through the catheter to help your surgeon see the area of the heart that needs treatment. Electrical currents will be sent from the catheter to destroy heart tissue in certain areas. There are three types of energy that may be used to do this: Heat (radiofrequency energy). Laser energy. Extreme cold (cryoablation). When the tissue has been destroyed, the catheter will be removed. Pressure will be  held on the insertion area to prevent bleeding. A bandage (dressing) will be placed over the insertion area. The procedure may vary among health care providers and hospitals. What happens after the procedure? Your blood pressure, heart rate and rhythm, breathing rate, and blood oxygen level will be monitored until you leave the hospital or clinic. Your insertion area will be checked for bleeding. You will need to lie still for a few hours. If your groin was used, you will need to keep your leg straight for a few hours after the catheter is removed. This information is not intended to replace advice given to you by your health care provider. Make sure you discuss any questions you have with your health care provider. Document Revised: 08/01/2021 Document Reviewed: 08/01/2021 Elsevier Patient Education  2024 [employer].

## 2023-06-05 NOTE — Progress Notes (Signed)
 " Electrophysiology Office Note:    Date:  06/05/2023   ID:  Meredith [name], DOB [birth year], MRN [id number]  PCP:  Marvene Prentice SAUNDERS, FNP    [facility] Providers Cardiologist:  Stanly DELENA Leavens, MD Electrophysiologist:  OLE ONEIDA HOLTS, MD     Referring MD: Marvene Prentice SAUNDERS, FNP   History of Present Illness:    Meredith [name] is a 73 y.o. female with a medical history significant for paroxysmal atrial fibrillation, bipolar disorder on lithium  referred for management of atrial fibrillation.     She has been seen by Dr. Holts in the past for consideration of watchman due to labile INRs.  She was not thought to be an excellent candidate at the time due to labile INRs.  Discussed the use of AI scribe software for clinical note transcription with the patient, who gave verbal consent to proceed.  History of Present Illness Meredith [name] is a 73 year old female with atrial fibrillation who presents with weakness, fatigue, and palpitations.  She has a history of atrial fibrillation since 2000 and has undergone three ablations. The first two ablations were performed using radiofrequency, and the third was a cryoablation in 2016. The first ablation lasted one year, the second lasted until 2016, and the cryoablation has lasted until now. Initially, episodes of atrial fibrillation felt like a heart attack, but currently, symptoms include significant weakness, fatigue, and palpitations.  She was seen in the emergency room on May 09, 2023, where atrial flutter was noted. She believes she experiences both atrial fibrillation and atrial flutter. During these episodes, her heart rate is described as 'pretty fast,' although she does not count it.  Her current medications include metoprolol , which was recently increased from 50 mg to 100 mg, providing only minimal relief. She is also on warfarin for anticoagulation but has not been able to afford other anticoagulants. She  monitors her INR regularly, with a recent reading of 3.3, which is outside the therapeutic range for her condition.  She mentions having a twin brother who also has atrial fibrillation, suggesting a possible familial component to her condition.         Today, she reports that she is doing reasonably well, no recent changes in her condition.  EKGs/Labs/Other Studies Reviewed Today:     Echocardiogram:  TTE June 2022 EF 60 to 65%.  Left atrium mildly dilated.     EKG:       I reviewed all electrocardiograms available in [facility].  EKG April 8 shows an atypical appearing atrial flutter  Physical Exam:    VS:  BP 128/71   Pulse 77   Ht 5' (1.524 m)   Wt 191 lb (86.6 kg)   LMP  (LMP Unknown)   SpO2 92%   BMI 37.30 kg/m     Wt Readings from Last 3 Encounters:  06/05/23 191 lb (86.6 kg)  06/04/23 187 lb (84.8 kg)  03/15/23 187 lb (84.8 kg)     GEN: Well nourished, well developed in no acute distress CARDIAC: RRR, no murmurs, rubs, gallops RESPIRATORY:  Normal work of breathing MUSCULOSKELETAL: no edema    ASSESSMENT & PLAN:     Paroxysmal atrial fibrillation, paroxysmal atrial flutter Symptomatic with palpitations, malaise Heart rates are controlled Status post 3 ablations -- two in the distant past We discussed management options.  Using a shared decision making approach, we decided to proceed with scheduling of ablation of atrial fibrillation and flutter.  We discussed the indication,  rationale, logistics, anticipated benefits, and potential risks of the ablation procedure including but not limited to -- bleed at the groin access site, chest pain, damage to nearby organs such as the diaphragm, lungs, or esophagus, need for a drainage tube, or prolonged hospitalization. I explained that the risk for stroke, heart attack, need for open chest surgery, or even death is very low but not zero. she  expressed understanding and wishes to proceed.   Secondary  hypercoagulable state On warfarin We are investigating getting her on patient assistance for eliquis  Hopefully, we can transition her to Eliquis  at least a month prior to the ablation and for 3 months following     Signed, Eulas FORBES Furbish, MD  06/05/2023 3:20 PM    [facility] "

## 2023-06-06 ENCOUNTER — Encounter: Payer: Self-pay | Admitting: Physician Assistant

## 2023-06-10 ENCOUNTER — Telehealth: Payer: Self-pay | Admitting: Cardiovascular Disease

## 2023-06-10 NOTE — Telephone Encounter (Signed)
 Paper Work Dropped Off: Meredith [name] patient assistance   Date: 06/10/2023  Location of paper:  Dr. Arlester Ladd Mailbox by EOD

## 2023-06-17 ENCOUNTER — Telehealth: Payer: Self-pay | Admitting: Pharmacy Technician

## 2023-06-17 NOTE — Telephone Encounter (Signed)
 Faxed completed paperwork faxed to our patient assistance team at [phone]

## 2023-06-17 NOTE — Telephone Encounter (Signed)
 PAP: Application for Eliquis has been submitted to [employer] ([employer]), via fax

## 2023-06-18 ENCOUNTER — Ambulatory Visit (INDEPENDENT_AMBULATORY_CARE_PROVIDER_SITE_OTHER): Payer: Medicare Other | Admitting: Psychology

## 2023-06-18 ENCOUNTER — Encounter: Payer: Self-pay | Admitting: *Deleted

## 2023-06-18 DIAGNOSIS — F319 Bipolar disorder, unspecified: Secondary | ICD-10-CM

## 2023-06-18 DIAGNOSIS — F3181 Bipolar II disorder: Secondary | ICD-10-CM

## 2023-06-18 NOTE — Progress Notes (Signed)
 Lilian Haydn Cush is a 73 y.o. female patient   Date: 06/18/2023  Treatment Plan: Diagnosis F31.81 (Bipolar II disorder) [n/a]  Symptoms Depressed or irritable mood. (Status: maintained) -- No Description Entered  Lack of energy. (Status: maintained) -- No Description Entered  Social withdrawal. (Status: maintained) -- No Description Entered  Medication Status compliance  Safety none  If Suicidal or Homicidal State Action Taken: unspecified  Current Risk: low Medications Abilify  (Dosage: 30mg )  Hydocodone (Dosage: 7.5mg )  Lamotrigine  (Dosage: 200mg /day)  Lithium  (Dosage: 450mg /day)  Seroquel  (Dosage: 200mg /day)  Objectives Related Problem: Develop healthy cognitive patterns and beliefs about self and the world that lead to alleviation and help prevent the relapse of mood episodes. Description: Verbalize grief, fear, and anger regarding real or imagined losses in life. Target Date: 2024-02-09 Frequency: Daily Modality: individual Progress: 85%  Related Problem: Develop healthy cognitive patterns and beliefs about self and the world that lead to alleviation and help prevent the relapse of mood episodes. Description: Take prescribed medications as directed. Target Date: 2022-02-08 Frequency: Daily Modality: individual Progress: 100%  Related Problem: Develop healthy cognitive patterns and beliefs about self and the world that lead to alleviation and help prevent the relapse of mood episodes. Description: Describe mood state, energy level, amount of control over thoughts, and sleeping pattern. Target Date: 2024-02-09 Frequency: Daily Modality: individual Progress: 80%  Client Response full compliance  Service Location Location, [address] [place], [place] [address]  Service Code cpt [phone] P Neuropsych. testing  Related past to present  Self-monitoring  Self care activities  Emotion regulation skills  Validate/empathize   Facilitate problem solving  Normalize/Reframe  Journaling  Comments  Dx.: F31.81  Goals: States she is seeking emotional stability. She needs guidance in managing some of her physical/medical challenges and limitations. Also, have some interpersonal challenges and seeks feedback on addressing these difficulties.Revised target date is 12-25.   Meds: Seroquel  (300mg  daily), Hydrocodone  (7.5 mg up to 3x/day), Buspar  (10mg  twice daily).   Patient agrees to be seen for a video Public affairs consultant) appointment and understands the limitations of this platform. She was at home and provider was in his office.  Jaiah says she woke up with a cold on Sunday (Easter) with a cold and could not go to church. She continues to feel sick today, but is managing symptoms. She reports that her blood pressure has been especially low and she got a call from her doctor's office. The readings get sent to her doctor. She saw her cardiologist and was told she has Afib and flutter. They have her scheduled for ablation and want her to go on Eliquis . She had radio frequency last week for her back. It was a painful procedure, but it helps her pain. She says that it was disclosed at church that their pastor was having an affair with a congregant. Both are married and he was fired. Itati is sad about the situation, but is supportive of the church and plans to continue to be involved. Says moods remain stable.           Aaron Aas                                                  [name], PhD Time: 1:15p-2:00p  45 minutes

## 2023-06-24 NOTE — Telephone Encounter (Signed)
 I called [employer] and they said they are working on this and should hear by today/tomorrrow

## 2023-06-25 ENCOUNTER — Encounter: Payer: Self-pay | Admitting: Pharmacy Technician

## 2023-06-25 NOTE — Telephone Encounter (Signed)
 PAP: Patient has been denied for patient assistance by [employer] ([employer]) due to below      Sent the patient a message

## 2023-06-27 ENCOUNTER — Telehealth: Payer: Self-pay | Admitting: Cardiovascular Disease

## 2023-06-27 ENCOUNTER — Other Ambulatory Visit (HOSPITAL_COMMUNITY): Payer: Self-pay

## 2023-06-27 ENCOUNTER — Telehealth: Payer: Self-pay

## 2023-06-27 NOTE — Telephone Encounter (Signed)
 New Message:     Patient says she wants Meredith [name] to know that she did not qualify for the Eliquis . She says she needs the coupon , the prescription, and 30 days of samples  please.   Patient calling the office for samples of medication:   1.  What medication and dosage are you requesting samples for? Eliquis   2.  Are you currently out of this medication?  A 30 day supply

## 2023-06-27 NOTE — Telephone Encounter (Signed)
 Left message to call back

## 2023-06-27 NOTE — Telephone Encounter (Signed)
 Pharmacy Patient Advocate Encounter  Insurance verification completed.   The patient is insured through [facility]   Ran test claim for ELIQUIS . Currently a quantity of 60 is a 30 day supply and the co-pay is $331.33 (DEDUCTIBLE TO MEET) . The current 30 day co-pay is, $331.33.  No PA needed at this time.  Patient was already denied this year for PAP for Eliquis  due to not meeting the out of pocket spending requirement. Only assistance option left to patient would be calling their insurance and requesting the Medicare Prescription Payment Plan option noted on the [employer] denial letter.

## 2023-06-28 NOTE — Telephone Encounter (Signed)
 Attempted to contact patient, left message to call office back. Patient assistance denied for eliquis , no need to switch from warfarin if patient unable to afford eliquis .

## 2023-06-28 NOTE — Telephone Encounter (Signed)
 Left message for pt to call.

## 2023-06-28 NOTE — Telephone Encounter (Signed)
 Pt requesting a c/b. Please advise

## 2023-07-02 ENCOUNTER — Telehealth: Payer: Self-pay | Admitting: Cardiovascular Disease

## 2023-07-02 ENCOUNTER — Ambulatory Visit (INDEPENDENT_AMBULATORY_CARE_PROVIDER_SITE_OTHER): Payer: Medicare Other | Admitting: Psychology

## 2023-07-02 DIAGNOSIS — F3181 Bipolar II disorder: Secondary | ICD-10-CM | POA: Diagnosis not present

## 2023-07-02 DIAGNOSIS — F319 Bipolar disorder, unspecified: Secondary | ICD-10-CM

## 2023-07-02 NOTE — Progress Notes (Signed)
                                  Meredith [name] is a 73 y.o. female patient   Date: 07/02/2023  Treatment Plan: Diagnosis F31.81 (Bipolar II disorder) [n/a]  Symptoms Depressed or irritable mood. (Status: maintained) -- No Description Entered  Lack of energy. (Status: maintained) -- No Description Entered  Social withdrawal. (Status: maintained) -- No Description Entered  Medication Status compliance  Safety none  If Suicidal or Homicidal State Action Taken: unspecified  Current Risk: low Medications Abilify  (Dosage: 30mg )  Hydocodone (Dosage: 7.5mg )  Lamotrigine  (Dosage: 200mg /day)  Lithium  (Dosage: 450mg /day)  Seroquel  (Dosage: 200mg /day)  Objectives Related Problem: Develop healthy cognitive patterns and beliefs about self and the world that lead to alleviation and help prevent the relapse of mood episodes. Description: Verbalize grief, fear, and anger regarding real or imagined losses in life. Target Date: 2024-02-09 Frequency: Daily Modality: individual Progress: 85%  Related Problem: Develop healthy cognitive patterns and beliefs about self and the world that lead to alleviation and help prevent the relapse of mood episodes. Description: Take prescribed medications as directed. Target Date: 2022-02-08 Frequency: Daily Modality: individual Progress: 100%  Related Problem: Develop healthy cognitive patterns and beliefs about self and the world that lead to alleviation and help prevent the relapse of mood episodes. Description: Describe mood state, energy level, amount of control over thoughts, and sleeping pattern. Target Date: 2024-02-09 Frequency: Daily Modality: individual Progress: 80%  Client Response full compliance  Service Location Location, [address] [place], [place] [address]  Service Code cpt [phone] P Neuropsych. testing  Related past to present  Self-monitoring  Self care activities  Emotion regulation  skills  Validate/empathize  Facilitate problem solving  Normalize/Reframe  Journaling  Comments  Dx.: F31.81  Goals: States she is seeking emotional stability. She needs guidance in managing some of her physical/medical challenges and limitations. Also, have some interpersonal challenges and seeks feedback on addressing these difficulties.Revised target date is 12-25.   Meds: Seroquel  (300mg  daily), Hydrocodone  (7.5 mg up to 3x/day), Buspar  (10mg  twice daily).   Patient agrees to be seen for a video Public affairs consultant) appointment and understands the limitations of this platform. She was at home and provider was in his office.  Glorietta had her birthday yesterday and a friend took her to lunch. States that she sees Dr. Toi Foster on the 16th and says she is not seeking any changes. Has been very active at church and finds it very gratifying. Has been dealing with a number of medical issues, including some cardiac concerns. Is scheduled for an ablation. She will see cardiologist this week for some testing.             Aaron Aas                                                  [name], PhD Time: 1:15p-2:00p 45 minutes

## 2023-07-02 NOTE — Telephone Encounter (Signed)
 Attempted to reach patient again, but went straight to voicemail. Left another message asking that she call office back to discuss cost of medication.

## 2023-07-02 NOTE — Telephone Encounter (Signed)
 Noted, pt has upcoming appt on 07/05/23.  Will place note on appt Eliquis  too expensive, will continue Warfarin.

## 2023-07-02 NOTE — Telephone Encounter (Signed)
 Pt c/o medication issue:  1. Name of Medication:   warfarin (COUMADIN ) 2.5 MG tablet   2. How are you currently taking this medication (dosage and times per day)?   As prescribed  3. Are you having a reaction (difficulty breathing--STAT)?   4. What is your medication issue?    Patient stated she completed paperwork for patient assistance to get Eliquis  and her application was denied.  Patient stated her insurance co-pay for Eliquis  is too high and she will stay on warfarin.

## 2023-07-03 NOTE — Telephone Encounter (Signed)
 Attempted to contact patient, left message to call our office back. Patient will need INR checks 3 week prior to ablation (6/6, 6/13, and 6/20)

## 2023-07-05 ENCOUNTER — Ambulatory Visit: Attending: Cardiovascular Disease | Admitting: *Deleted

## 2023-07-05 DIAGNOSIS — I480 Paroxysmal atrial fibrillation: Secondary | ICD-10-CM | POA: Diagnosis present

## 2023-07-05 DIAGNOSIS — Z5181 Encounter for therapeutic drug level monitoring: Secondary | ICD-10-CM | POA: Insufficient documentation

## 2023-07-05 LAB — POCT INR: INR: 4.6 — AB (ref 2.0–3.0)

## 2023-07-05 NOTE — Patient Instructions (Addendum)
 Description   DO NOT TAKE ANY WARFARIN SATURDAY (already taken today's dose) and NO WARFARIN SUNDAY then START taking warfarin 1 tablet daily except 1.5 tablets on Tuesdays and Saturdays. Recheck INR in 3 weeks when having labs drawn at [facility].  Call with any medication changes or if you are scheduled for surgery  Coumadin  Clinic [phone] or [phone] Main [phone]

## 2023-07-08 ENCOUNTER — Ambulatory Visit: Payer: Medicare Other | Admitting: Internal Medicine

## 2023-07-16 ENCOUNTER — Ambulatory Visit (INDEPENDENT_AMBULATORY_CARE_PROVIDER_SITE_OTHER): Payer: Medicare Other | Admitting: Psychology

## 2023-07-16 DIAGNOSIS — F3181 Bipolar II disorder: Secondary | ICD-10-CM

## 2023-07-16 DIAGNOSIS — F319 Bipolar disorder, unspecified: Secondary | ICD-10-CM

## 2023-07-16 NOTE — Progress Notes (Signed)
                                                 Meredith [name] is a 73 y.o. female patient   Date: 07/16/2023  Treatment Plan: Diagnosis F31.81 (Bipolar II disorder) [n/a]  Symptoms Depressed or irritable mood. (Status: maintained) -- No Description Entered  Lack of energy. (Status: maintained) -- No Description Entered  Social withdrawal. (Status: maintained) -- No Description Entered  Medication Status compliance  Safety none  If Suicidal or Homicidal State Action Taken: unspecified  Current Risk: low Medications Abilify  (Dosage: 30mg )  Hydocodone (Dosage: 7.5mg )  Lamotrigine  (Dosage: 200mg /day)  Lithium  (Dosage: 450mg /day)  Seroquel  (Dosage: 200mg /day)  Objectives Related Problem: Develop healthy cognitive patterns and beliefs about self and the world that lead to alleviation and help prevent the relapse of mood episodes. Description: Verbalize grief, fear, and anger regarding real or imagined losses in life. Target Date: 2024-02-09 Frequency: Daily Modality: individual Progress: 85%  Related Problem: Develop healthy cognitive patterns and beliefs about self and the world that lead to alleviation and help prevent the relapse of mood episodes. Description: Take prescribed medications as directed. Target Date: 2022-02-08 Frequency: Daily Modality: individual Progress: 100%  Related Problem: Develop healthy cognitive patterns and beliefs about self and the world that lead to alleviation and help prevent the relapse of mood episodes. Description: Describe mood state, energy level, amount of control over thoughts, and sleeping pattern. Target Date: 2024-02-09 Frequency: Daily Modality: individual Progress: 80%  Client Response full compliance  Service Location Location, [address] [place], [place] [address]  Service Code cpt [phone] P Neuropsych. testing  Related past to present  Self-monitoring  Self care  activities  Emotion regulation skills  Validate/empathize  Facilitate problem solving  Normalize/Reframe  Journaling  Comments  Dx.: F31.81  Goals: States she is seeking emotional stability. She needs guidance in managing some of her physical/medical challenges and limitations. Also, have some interpersonal challenges and seeks feedback on addressing these difficulties.Revised target date is 12-25.   Meds: Seroquel  (300mg  daily), Hydrocodone  (7.5 mg up to 3x/day), Buspar  (10mg  twice daily).   Patient agrees to be seen for a video Public affairs consultant) appointment and understands the limitations of this platform. She was at home and provider was in his office.  Meredith [name] says she is still "dizzy and tired". Meds have been adjusted and she is scheduled to get labs evaluated. Her INR is not good and she is working to bring it down. She was gratified that one of her son's contacted her for Mother's Day Landon [name], the youngest). She hasn't talked with her "middle child" in 25 years. Landon [name] is the only one that maintains any contact. Meredith [name] has come to accept her relationship with her sons.                Meredith Nash, PhD Time: 1:15p-2:00p 45 minutes

## 2023-07-23 ENCOUNTER — Telehealth: Payer: Self-pay

## 2023-07-23 NOTE — Telephone Encounter (Signed)
 Attempted to contact patient to remind her of the needed INR checks around 08/02/23, 08/09/23, and 08/16/18 - her ablation is scheduled for 6/27.

## 2023-07-24 ENCOUNTER — Ambulatory Visit: Attending: Cardiovascular Disease

## 2023-07-24 DIAGNOSIS — Z5181 Encounter for therapeutic drug level monitoring: Secondary | ICD-10-CM | POA: Diagnosis present

## 2023-07-24 DIAGNOSIS — I480 Paroxysmal atrial fibrillation: Secondary | ICD-10-CM | POA: Diagnosis present

## 2023-07-24 LAB — CBC
Hematocrit: 37.2 % (ref 34.0–46.6)
Hemoglobin: 12.3 g/dL (ref 11.1–15.9)
MCH: 30.4 pg (ref 26.6–33.0)
MCHC: 33.1 g/dL (ref 31.5–35.7)
MCV: 92 fL (ref 79–97)
Platelets: 208 10*3/uL (ref 150–450)
RBC: 4.05 x10E6/uL (ref 3.77–5.28)
RDW: 12.8 % (ref 11.7–15.4)
WBC: 6.1 10*3/uL (ref 3.4–10.8)

## 2023-07-24 LAB — POCT INR: INR: 3.4 — AB (ref 2.0–3.0)

## 2023-07-24 NOTE — Patient Instructions (Addendum)
 Description   DO NOT TAKE ANY WARFARIN TOMORROW (already taken today's dose) and then START taking warfarin 1 tablet daily except 1.5 tablets on Tuesdays  Recheck INR in 1 week  Call with any medication changes or if you are scheduled for surgery  Coumadin  Clinic [phone] Main [phone]

## 2023-07-25 ENCOUNTER — Encounter: Payer: Self-pay | Admitting: Cardiovascular Disease

## 2023-07-25 ENCOUNTER — Ambulatory Visit: Payer: Self-pay | Admitting: *Deleted

## 2023-07-25 LAB — BASIC METABOLIC PANEL WITH GFR
BUN/Creatinine Ratio: 17 (ref 12–28)
BUN: 16 mg/dL (ref 8–27)
CO2: 24 mmol/L (ref 20–29)
Calcium: 9.6 mg/dL (ref 8.7–10.3)
Chloride: 102 mmol/L (ref 96–106)
Creatinine, Ser: 0.95 mg/dL (ref 0.57–1.00)
Glucose: 81 mg/dL (ref 70–99)
Potassium: 4.8 mmol/L (ref 3.5–5.2)
Sodium: 140 mmol/L (ref 134–144)
eGFR: 63 mL/min/{1.73_m2} (ref >59)

## 2023-07-29 ENCOUNTER — Other Ambulatory Visit: Payer: Self-pay | Admitting: Internal Medicine

## 2023-07-29 DIAGNOSIS — I480 Paroxysmal atrial fibrillation: Secondary | ICD-10-CM

## 2023-07-30 ENCOUNTER — Ambulatory Visit (INDEPENDENT_AMBULATORY_CARE_PROVIDER_SITE_OTHER): Payer: Medicare Other | Admitting: Psychology

## 2023-07-30 DIAGNOSIS — F3181 Bipolar II disorder: Secondary | ICD-10-CM

## 2023-07-30 DIAGNOSIS — F319 Bipolar disorder, unspecified: Secondary | ICD-10-CM

## 2023-07-30 NOTE — Progress Notes (Signed)
                                                                Meredith [name] is a 73 y.o. female patient   Date: 07/30/2023  Treatment Plan: Diagnosis F31.81 (Bipolar II disorder) [n/a]  Symptoms Depressed or irritable mood. (Status: maintained) -- No Description Entered  Lack of energy. (Status: maintained) -- No Description Entered  Social withdrawal. (Status: maintained) -- No Description Entered  Medication Status compliance  Safety none  If Suicidal or Homicidal State Action Taken: unspecified  Current Risk: low Medications Abilify  (Dosage: 30mg )  Hydocodone (Dosage: 7.5mg )  Lamotrigine  (Dosage: 200mg /day)  Lithium  (Dosage: 450mg /day)  Seroquel  (Dosage: 200mg /day)  Objectives Related Problem: Develop healthy cognitive patterns and beliefs about self and the world that lead to alleviation and help prevent the relapse of mood episodes. Description: Verbalize grief, fear, and anger regarding real or imagined losses in life. Target Date: 2024-02-09 Frequency: Daily Modality: individual Progress: 85%  Related Problem: Develop healthy cognitive patterns and beliefs about self and the world that lead to alleviation and help prevent the relapse of mood episodes. Description: Take prescribed medications as directed. Target Date: 2022-02-08 Frequency: Daily Modality: individual Progress: 100%  Related Problem: Develop healthy cognitive patterns and beliefs about self and the world that lead to alleviation and help prevent the relapse of mood episodes. Description: Describe mood state, energy level, amount of control over thoughts, and sleeping pattern. Target Date: 2024-02-09 Frequency: Daily Modality: individual Progress: 80%  Client Response full compliance  Service Location Location, [address] [place], [place] [address]  Service Code cpt [phone] P Neuropsych. testing  Related past to present   Self-monitoring  Self care activities  Emotion regulation skills  Validate/empathize  Facilitate problem solving  Normalize/Reframe  Journaling  Comments  Dx.: F31.81  Goals: States she is seeking emotional stability. She needs guidance in managing some of her physical/medical challenges and limitations. Also, have some interpersonal challenges and seeks feedback on addressing these difficulties.Revised target date is 12-25.   Meds: Seroquel  (300mg  daily), Hydrocodone  (7.5 mg up to 3x/day), Buspar  (10mg  twice daily).   Patient agrees to be seen for a video Public affairs consultant) appointment and understands the limitations of this platform. She was at home and provider was in his office.  Meredith [name] says that she has a lot of doctor's appointments this week. Will meet with Dr. Toi [name] on the 16th of this month. She is doing well and says that her moods have been relatively stable. She is trying to stay active and busy. She has not been getting out as much lately, but anticipates that changing. Relationship with brother is strong and they do see each other on a regular cadence.                  Aaron Aas                                                  [name], PhD Time: 1:15p-2:00p 45 minutes

## 2023-07-31 ENCOUNTER — Ambulatory Visit: Attending: Cardiovascular Disease | Admitting: *Deleted

## 2023-07-31 DIAGNOSIS — Z5181 Encounter for therapeutic drug level monitoring: Secondary | ICD-10-CM | POA: Diagnosis present

## 2023-07-31 DIAGNOSIS — I480 Paroxysmal atrial fibrillation: Secondary | ICD-10-CM | POA: Insufficient documentation

## 2023-07-31 LAB — POCT INR: INR: 3.1 — AB (ref 2.0–3.0)

## 2023-07-31 NOTE — Patient Instructions (Signed)
 Description   Tomorrow take 1/2 tablet of warfarin (already taken today's dose) and then continue taking warfarin 1 tablet daily except 1.5 tablets on Tuesdays  Recheck INR in 1 week  Call with any medication changes or if you are scheduled for surgery  Coumadin  Clinic [phone] Main [phone]

## 2023-08-02 ENCOUNTER — Ambulatory Visit (HOSPITAL_COMMUNITY)
Admission: RE | Admit: 2023-08-02 | Discharge: 2023-08-02 | Disposition: A | Discharge status: Home / Self Care | Source: Ambulatory Visit | Attending: Cardiovascular Disease | Admitting: Cardiovascular Disease

## 2023-08-02 DIAGNOSIS — I480 Paroxysmal atrial fibrillation: Secondary | ICD-10-CM | POA: Insufficient documentation

## 2023-08-02 DIAGNOSIS — I4892 Unspecified atrial flutter: Secondary | ICD-10-CM | POA: Insufficient documentation

## 2023-08-02 MED ORDER — IOHEXOL 350 MG/ML SOLN
100.0000 mL | Freq: Once | INTRAVENOUS | Status: AC | PRN
  Administered 2023-08-02: 100 mL via INTRAVENOUS

## 2023-08-06 ENCOUNTER — Ambulatory Visit: Attending: Cardiovascular Disease | Admitting: *Deleted

## 2023-08-06 DIAGNOSIS — I480 Paroxysmal atrial fibrillation: Secondary | ICD-10-CM | POA: Insufficient documentation

## 2023-08-06 DIAGNOSIS — Z5181 Encounter for therapeutic drug level monitoring: Secondary | ICD-10-CM | POA: Diagnosis present

## 2023-08-06 LAB — POCT INR: INR: 2.2 (ref 2.0–3.0)

## 2023-08-06 NOTE — Patient Instructions (Signed)
 Description   Today take extra 1/2 tablet of warfarin then continue taking warfarin 1 tablet daily except 1.5 tablets on Tuesdays  Recheck INR in 1 week  Call with any medication changes or if you are scheduled for surgery  Coumadin  Clinic [phone] Main [phone]

## 2023-08-12 ENCOUNTER — Ambulatory Visit: Admitting: Psychiatry

## 2023-08-12 ENCOUNTER — Encounter: Payer: Self-pay | Admitting: Psychiatry

## 2023-08-12 DIAGNOSIS — F319 Bipolar disorder, unspecified: Secondary | ICD-10-CM | POA: Diagnosis not present

## 2023-08-12 DIAGNOSIS — F4310 Post-traumatic stress disorder, unspecified: Secondary | ICD-10-CM | POA: Diagnosis not present

## 2023-08-12 DIAGNOSIS — F4001 Agoraphobia with panic disorder: Secondary | ICD-10-CM

## 2023-08-12 DIAGNOSIS — Z8669 Personal history of other diseases of the nervous system and sense organs: Secondary | ICD-10-CM

## 2023-08-12 DIAGNOSIS — F411 Generalized anxiety disorder: Secondary | ICD-10-CM | POA: Diagnosis not present

## 2023-08-12 DIAGNOSIS — G3184 Mild cognitive impairment, so stated: Secondary | ICD-10-CM

## 2023-08-12 DIAGNOSIS — F3132 Bipolar disorder, current episode depressed, moderate: Secondary | ICD-10-CM

## 2023-08-12 MED ORDER — BUSPIRONE HCL 15 MG PO TABS: 15.0000 mg | ORAL_TABLET | Freq: Two times a day (BID) | ORAL | 1 refills | Status: DC

## 2023-08-12 MED ORDER — QUETIAPINE FUMARATE 300 MG PO TABS
300.0000 mg | ORAL_TABLET | Freq: Every day | ORAL | 1 refills | Status: DC
Start: 2023-08-12 — End: 2023-11-11

## 2023-08-12 NOTE — Progress Notes (Signed)
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   verapamil  (CALAN -SR) 120 MG CR tablet Take 120 mg by mouth daily.     vitamin B-12 1000 MCG tablet Take 1 tablet (1,000 mcg total) by mouth daily.     warfarin (COUMADIN ) 2.5 MG tablet TAKE 1 TO 1 & 1/2 (ONE TO ONE & ONE-HALF) TABLETS BY MOUTH ONCE DAILY AS DIRECTED BY  COUMADIN   CLINIC. 120 tablet 0   No current facility-administered medications for this visit.    Medication Side Effects: as noted, denies sedation  Allergies:  Allergies  Allergen Reactions   Codeine Anaphylaxis    Patient states she can take codeine but not percocet    Adhesive [Tape]     blister   Celebrex [Celecoxib] Hives   Erythromycin Other (See Comments)   Other     Other reaction(s): MAKE HER CRAZY Other reaction(s): rash, high fever,welts Other reaction(s): rash Other reaction(s): severe diarrhea/vomiting Other reaction(s): Unknown   Penicillamine Diarrhea    Other reaction(s): Vomiting   Sulfa Antibiotics Hives   Tricyclic Antidepressants     Doesn't help   Amoxicillin Rash   Ampicillin Rash   Cephalexin Diarrhea   Macrodantin [Nitrofurantoin] Rash   Penicillins Rash    Did it involve swelling of the face/tongue/throat, SOB, or low BP? Yes Did it involve sudden or severe rash/hives, skin peeling, or any reaction on the inside of your mouth or nose? NO Did you  need to seek medical attention at a hospital or doctor's office? NO When did it last happen? childhood       If all above answers are "NO", may proceed with cephalosporin use.    Past Medical History:  Diagnosis Date   Anxiety    Atrial fibrillation [facility])    Atrial fibrillation (HCC)    Bipolar 2 disorder (HCC)    Chronic kidney disease    Depression    GERD (gastroesophageal reflux disease)    Hematoma 07/06/2020   History of cardioversion    x2   History of cardioversion    Hyperlipidemia    Hypothyroidism    Ingrown toenail 10/10/2020   Migraine headache  Osteomyelitis of foot (HCC) 11/11/2020   Osteoporosis    Pre-diabetes    Septic arthritis of interphalangeal joint of toe of right foot (HCC) 07/06/2020   Neg sleep study 4 years ago Family History  Problem Relation Age of Onset   Arthritis Mother    Depression Mother    Diabetes Mother    Heart disease Mother    Stroke Mother    Early death Father    Heart disease Father    Atrial fibrillation Brother    Hyperlipidemia Brother    Multiple sclerosis Sister    Alcohol abuse Brother    Asthma Brother    Drug abuse Brother    Hypertension Brother    Mental illness Brother     Social History   Socioeconomic History   Marital status: Divorced    Spouse name: Not on file   Number of children: 3   Years of education: Not on file   Highest education level: Not on file  Occupational History   Occupation: Retired  Tobacco Use   Smoking status: Never   Smokeless tobacco: Never  Vaping Use   Vaping status: Not on file  Substance and Sexual Activity   Alcohol use: Not Currently   Drug use: No   Sexual activity: Not Currently  Other Topics Concern   Not on file  Social History Narrative   Not on file   Social Drivers of Health   Financial Resource Strain: Not on file  Food Insecurity: Not on file  Transportation Needs: Not on file  Physical Activity: Not on file  Stress: Not on file  Social  Connections: Not on file  Intimate Partner Violence: Not on file    Past Medical History, Surgical history, Social history, and Family history were reviewed and updated as appropriate.   ADOPTED but has twin brother.  Please see review of systems for further details on the patient's review from today.   Objective:   Physical Exam:  LMP  (LMP Unknown)   Physical Exam Constitutional:      General: She is not in acute distress.    Appearance: She is well-developed. She is obese.   Musculoskeletal:        General: No deformity.   Neurological:     Mental Status: She is alert and oriented to person, place, and time.     Motor: No weakness.     Coordination: Coordination normal.     Gait: Gait abnormal.     Comments: cane with good pace in gait   Psychiatric:        Attention and Perception: Perception normal. She does not perceive auditory or visual hallucinations.        Mood and Affect: Mood is anxious. Mood is not depressed. Affect is not labile, blunt, angry, tearful or inappropriate.        Speech: Speech normal. Speech is not rapid and pressured or slurred.        Behavior: Behavior normal. Behavior is not agitated.        Thought Content: Thought content normal. Thought content is not delusional. Thought content does not include homicidal or suicidal ideation. Thought content does not include suicidal plan.        Cognition and Memory: Cognition is not impaired. She exhibits impaired recent memory.     Comments: Insight and judgment fair No delusions.  Cognition appears baseline but she feels it's impaired Neat and pleasant Talkative without pressure. Somatic focus.  Lab Review:     Component Value Date/Time   NA 140 07/24/2023 1452   K 4.8 07/24/2023 1452   CL 102 07/24/2023 1452   CO2 24 07/24/2023 1452   GLUCOSE 81 07/24/2023 1452   GLUCOSE 100 (H) 05/09/2023 1521   BUN 16 07/24/2023 1452   CREATININE 0.95 07/24/2023 1452   CREATININE 1.02 (H)  11/11/2020 1116   CALCIUM  9.6 07/24/2023 1452   PROT 7.7 05/09/2023 1521   ALBUMIN 4.8 05/09/2023 1521   AST 16 05/09/2023 1521   ALT 13 05/09/2023 1521   ALKPHOS 41 05/09/2023 1521   BILITOT 0.8 05/09/2023 1521   GFRNONAA >60 05/09/2023 1521   GFRNONAA 61 09/02/2020 1552   GFRAA 71 09/02/2020 1552       Component Value Date/Time   WBC 6.1 07/24/2023 1453   WBC 14.5 (H) 05/09/2023 1521   RBC 4.05 07/24/2023 1453   RBC 4.68 05/09/2023 1521   HGB 12.3 07/24/2023 1453   HCT 37.2 07/24/2023 1453   PLT 208 07/24/2023 1453   MCV 92 07/24/2023 1453   MCH 30.4 07/24/2023 1453   MCH 31.0 05/09/2023 1521   MCHC 33.1 07/24/2023 1453   MCHC 34.1 05/09/2023 1521   RDW 12.8 07/24/2023 1453   LYMPHSABS 1.1 03/15/2023 1139   MONOABS 0.8 03/15/2023 1139   EOSABS 0.2 03/15/2023 1139   BASOSABS 0.0 03/15/2023 1139    Lithium  Lvl  Date Value Ref Range Status  08/18/2020 0.17 (L) 0.60 - 1.20 mmol/L Final    Comment:    Performed at [facility], [address] [place], [place] [address]    May 20, 2019 lithium  level 0.9 on 450 mg daily and creatinine was 1.1 with normal calcium  07/21/19 lithium  0.4 on 450 mg daily, normal CMP  No results found for: PHENYTOIN, PHENOBARB, VALPROATE, CBMZ   .res Assessment: Plan:     Jeniyah was seen today for follow-up, depression, anxiety and sleeping problem.  Diagnoses and all orders for this visit:  Bipolar affective disorder, rapid cycling (HCC)  Generalized anxiety disorder  Panic disorder with agoraphobia  PTSD (post-traumatic stress disorder)  Mild cognitive impairment  History of encephalopathy    History of lithium  toxicity Hx repeated bouts of encephalopathy  30 min face to face time with patient.  We discussed the following.  Annissa is a chronically mentally ill patient with chronic depression and chronic anxiety and multiple med failures.  Overall is more stable psychiatrically on lower-dose quetiapine  than  in the past  .  Dose was decreased to minimize SE risk.  SP hosp for acute encephalopathy 07/2020.  The cause was unknown but presumed to be medication related.   Cognition appears stable in last couple of years.  .  Using pillbox to help compliance.  Disc danger of mixing up or doubling up meds.  She fills box herself.  Rec she get help with this. Cannot afford branded meds which prevents us  from using some of the bipolar depression meds like Vraylar.  Few options for depression and anxiety, Option Trileptal not as good for depression. therefore increased Seroquel  to 300 mg HS  has doing ok with this so far. Caution re: sedative risks emphasized and fall risk.  SE some sleepiness but manageable. Afraid of going lower in Seroquel  bc fear of relapse as it is the main mood stabilizer.    Push fluids.  Has to remind herself. Emphasized again.  Says she's doing well with this.  Disc this again to  try to reduce risk recurrent dehydration and fall risk.    Discussed potential metabolic side effects associated with atypical antipsychotics, as well as potential risk for movement side effects. Advised pt to contact office if movement side effects occur.  Also disc risk quetiapine  contributing to orthostasis.   OK Ativan  prn for dental procedure 1-2 mg.  No driving after it.  PCP Leretha Rankin, Anner Bars  Continue therapy with Dr. Gutterman q 2 weeks.  Supportive therapy dealing with ill friends and chronic health issues  Care re: driving.  Don't drive if drowsy.  She agrees.  No med changes indicated  Current psych meds Seroquel  300 HS are preventing mood cycling and helping depression Continue buspirone  15 BID helped anxiety.  Follow-up  2-4  mos.  She wants to have frequent follow up appts bc some chronic instability.  Nori Beat MD, DFAPA  Please see After Visit Summary for patient specific instructions.  Future Appointments  Date Time Provider Department Center  08/13/2023  1:00  PM Audery Blazing, PhD [facility] None  08/14/2023  2:00 PM [facility]  CLINIC 2 [facility]  08/22/2023  1:45 PM [facility] COUMAD[facility]  CLINIC 2 [facility] H&V  08/27/2023  1:00 PM Audery Blazing, PhD [facility] None  09/10/2023  1:00 PM Audery Blazing, PhD [facility] None  09/24/2023  1:00 PM Audery Blazing, PhD [facility] None    No orders of the defined types were placed in this encounter.      -------------------------------

## 2023-08-13 ENCOUNTER — Ambulatory Visit (INDEPENDENT_AMBULATORY_CARE_PROVIDER_SITE_OTHER): Admitting: Psychology

## 2023-08-13 DIAGNOSIS — F319 Bipolar disorder, unspecified: Secondary | ICD-10-CM

## 2023-08-13 DIAGNOSIS — F3181 Bipolar II disorder: Secondary | ICD-10-CM | POA: Diagnosis not present

## 2023-08-13 NOTE — Progress Notes (Signed)
                                                                               Meredith [name] is a 73 y.o. female patient   Date: 08/13/2023  Treatment Plan: Diagnosis F31.81 (Bipolar II disorder) [n/a]  Symptoms Depressed or irritable mood. (Status: maintained) -- No Description Entered  Lack of energy. (Status: maintained) -- No Description Entered  Social withdrawal. (Status: maintained) -- No Description Entered  Medication Status compliance  Safety none  If Suicidal or Homicidal State Action Taken: unspecified  Current Risk: low Medications Abilify  (Dosage: 30mg )  Hydocodone (Dosage: 7.5mg )  Lamotrigine  (Dosage: 200mg /day)  Lithium  (Dosage: 450mg /day)  Seroquel  (Dosage: 200mg /day)  Objectives Related Problem: Develop healthy cognitive patterns and beliefs about self and the world that lead to alleviation and help prevent the relapse of mood episodes. Description: Verbalize grief, fear, and anger regarding real or imagined losses in life. Target Date: 2024-02-09 Frequency: Daily Modality: individual Progress: 85%  Related Problem: Develop healthy cognitive patterns and beliefs about self and the world that lead to alleviation and help prevent the relapse of mood episodes. Description: Take prescribed medications as directed. Target Date: 2022-02-08 Frequency: Daily Modality: individual Progress: 100%  Related Problem: Develop healthy cognitive patterns and beliefs about self and the world that lead to alleviation and help prevent the relapse of mood episodes. Description: Describe mood state, energy level, amount of control over thoughts, and sleeping pattern. Target Date: 2024-02-09 Frequency: Daily Modality: individual Progress: 80%  Client Response full compliance  Service Location Location, [address] [place], [place] [address]  Service Code cpt [phone] P [facility].  testing  Related past to present  Self-monitoring  Self care activities  Emotion regulation skills  Validate/empathize  Facilitate problem solving  Normalize/Reframe  Journaling  Comments  Dx.: F31.81  Goals: States she is seeking emotional stability. She needs guidance in managing some of her physical/medical challenges and limitations. Also, have some interpersonal challenges and seeks feedback on addressing these difficulties.Revised target date is 12-25.   Meds: Seroquel  (300mg  daily), Hydrocodone  (7.5 mg up to 3x/day), Buspar  (10mg  twice daily).   Patient agrees to be seen for a video Public affairs consultant) appointment and understands the limitations of this platform. She was at home and provider was in his office.  Meredith [name] saw Dr. Toi Foster yesterday and did not make any medication changes. She had her SI joint injection and it has helped her discomfort. She is scheduled for an Ablation a week from Friday. She is feeling optimistic about the procedure. Moods stable and is feeling comfortable with current state of family relationships.                     Meredith Nash, PhD Time: 1:15p-2:00p 45 minutes

## 2023-08-14 ENCOUNTER — Ambulatory Visit: Attending: Cardiovascular Disease

## 2023-08-14 DIAGNOSIS — I480 Paroxysmal atrial fibrillation: Secondary | ICD-10-CM | POA: Insufficient documentation

## 2023-08-14 DIAGNOSIS — Z5181 Encounter for therapeutic drug level monitoring: Secondary | ICD-10-CM | POA: Insufficient documentation

## 2023-08-14 LAB — POCT INR: INR: 3.6 — AB (ref 2.0–3.0)

## 2023-08-14 NOTE — Patient Instructions (Signed)
 Description   Only take 1/2 tablet tomorrow (already took today's dose) and then continue taking warfarin 1 tablet daily except 1.5 tablets on Tuesdays  Recheck INR in 1 week  Call with any medication changes or if you are scheduled for surgery  Coumadin  Clinic [phone] Main [phone]

## 2023-08-22 ENCOUNTER — Ambulatory Visit: Attending: Cardiovascular Disease

## 2023-08-22 DIAGNOSIS — Z5181 Encounter for therapeutic drug level monitoring: Secondary | ICD-10-CM | POA: Diagnosis present

## 2023-08-22 DIAGNOSIS — I480 Paroxysmal atrial fibrillation: Secondary | ICD-10-CM | POA: Insufficient documentation

## 2023-08-22 LAB — POCT INR: INR: 3.2 — AB (ref 2.0–3.0)

## 2023-08-22 NOTE — Progress Notes (Signed)
Please see anticoagulation encounter.

## 2023-08-22 NOTE — Pre-Procedure Instructions (Signed)
 Instructed patient on the following items: Arrival time 0800 Nothing to eat or drink after midnight No meds AM of procedure Responsible person to drive you home and stay with you for 24 hrs  Have you missed any doses of anti-coagulant Coumadin - takes once a day, hasn't missed any doses.  INR today is 3.2.

## 2023-08-22 NOTE — Patient Instructions (Signed)
 Description   Continue taking warfarin 1 tablet daily except 1.5 tablets on Tuesdays  Recheck INR in 1 week post procedure Call with any medication changes or if you are scheduled for surgery  Coumadin  Clinic [phone] Main [phone]

## 2023-08-23 ENCOUNTER — Ambulatory Visit (HOSPITAL_BASED_OUTPATIENT_CLINIC_OR_DEPARTMENT_OTHER): Admitting: Registered Nurse

## 2023-08-23 ENCOUNTER — Ambulatory Visit (HOSPITAL_COMMUNITY)
Admission: RE | Admit: 2023-08-23 | Discharge: 2023-08-23 | Disposition: A | Discharge status: Home / Self Care | Attending: Cardiovascular Disease | Admitting: Cardiovascular Disease

## 2023-08-23 ENCOUNTER — Ambulatory Visit (HOSPITAL_COMMUNITY): Admitting: Registered Nurse

## 2023-08-23 ENCOUNTER — Other Ambulatory Visit: Payer: Self-pay

## 2023-08-23 ENCOUNTER — Ambulatory Visit (HOSPITAL_COMMUNITY)
Admission: RE | Disposition: A | Discharge status: Home / Self Care | Payer: Self-pay | Source: Home / Self Care | Attending: Cardiovascular Disease

## 2023-08-23 DIAGNOSIS — Z7901 Long term (current) use of anticoagulants: Secondary | ICD-10-CM | POA: Diagnosis not present

## 2023-08-23 DIAGNOSIS — E039 Hypothyroidism, unspecified: Secondary | ICD-10-CM | POA: Diagnosis not present

## 2023-08-23 DIAGNOSIS — I4891 Unspecified atrial fibrillation: Secondary | ICD-10-CM

## 2023-08-23 DIAGNOSIS — D6869 Other thrombophilia: Secondary | ICD-10-CM | POA: Diagnosis not present

## 2023-08-23 DIAGNOSIS — K219 Gastro-esophageal reflux disease without esophagitis: Secondary | ICD-10-CM | POA: Insufficient documentation

## 2023-08-23 DIAGNOSIS — F418 Other specified anxiety disorders: Secondary | ICD-10-CM | POA: Diagnosis not present

## 2023-08-23 DIAGNOSIS — I484 Atypical atrial flutter: Secondary | ICD-10-CM

## 2023-08-23 DIAGNOSIS — I4819 Other persistent atrial fibrillation: Secondary | ICD-10-CM

## 2023-08-23 HISTORY — PX: ATRIAL FIBRILLATION ABLATION: EP1191

## 2023-08-23 LAB — PROTIME-INR
INR: 3.1 — ABNORMAL HIGH (ref 0.8–1.2)
Prothrombin Time: 33 s — ABNORMAL HIGH (ref 11.4–15.2)

## 2023-08-23 LAB — GLUCOSE, CAPILLARY: Glucose-Capillary: 101 mg/dL — ABNORMAL HIGH (ref 70–99)

## 2023-08-23 MED ORDER — ONDANSETRON HCL 4 MG/2ML IJ SOLN
INTRAMUSCULAR | Status: DC | PRN
Start: 2023-08-23 — End: 2023-08-23
  Administered 2023-08-23: 4 mg via INTRAVENOUS

## 2023-08-23 MED ORDER — SODIUM CHLORIDE 0.9 % IV SOLN: 250.0000 mL | INTRAVENOUS | Status: DC | PRN

## 2023-08-23 MED ORDER — FENTANYL CITRATE (PF) 100 MCG/2ML IJ SOLN
INTRAMUSCULAR | Status: AC
Start: 2023-08-23 — End: 2023-08-23
  Filled 2023-08-23: qty 2

## 2023-08-23 MED ORDER — LIDOCAINE 2% (20 MG/ML) 5 ML SYRINGE
INTRAMUSCULAR | Status: DC | PRN
  Administered 2023-08-23: 80 mg via INTRAVENOUS

## 2023-08-23 MED ORDER — ACETAMINOPHEN 325 MG PO TABS: 650.0000 mg | ORAL_TABLET | ORAL | Status: DC | PRN

## 2023-08-23 MED ORDER — OXYCODONE HCL 5 MG/5ML PO SOLN: 5.0000 mg | Freq: Once | ORAL | Status: DC | PRN

## 2023-08-23 MED ORDER — ONDANSETRON HCL 4 MG/2ML IJ SOLN: 4.0000 mg | Freq: Four times a day (QID) | INTRAMUSCULAR | Status: DC | PRN

## 2023-08-23 MED ORDER — ROCURONIUM BROMIDE 10 MG/ML (PF) SYRINGE
PREFILLED_SYRINGE | INTRAVENOUS | Status: DC | PRN
  Administered 2023-08-23: 80 mg via INTRAVENOUS
  Administered 2023-08-23: 10 mg via INTRAVENOUS

## 2023-08-23 MED ORDER — HYDROMORPHONE HCL 1 MG/ML IJ SOLN
0.2500 mg | INTRAMUSCULAR | Status: DC | PRN
  Administered 2023-08-23 (×2): 0.5 mg via INTRAVENOUS

## 2023-08-23 MED ORDER — HYDROMORPHONE HCL 1 MG/ML IJ SOLN
INTRAMUSCULAR | Status: AC
  Filled 2023-08-23: qty 0.5

## 2023-08-23 MED ORDER — SODIUM CHLORIDE 0.9% FLUSH: 3.0000 mL | Freq: Two times a day (BID) | INTRAVENOUS | Status: DC

## 2023-08-23 MED ORDER — PROTAMINE SULFATE 10 MG/ML IV SOLN
INTRAVENOUS | Status: DC | PRN
Start: 2023-08-23 — End: 2023-08-23
  Administered 2023-08-23: 50 mg via INTRAVENOUS

## 2023-08-23 MED ORDER — FENTANYL CITRATE (PF) 250 MCG/5ML IJ SOLN
INTRAMUSCULAR | Status: DC | PRN
  Administered 2023-08-23 (×2): 50 ug via INTRAVENOUS

## 2023-08-23 MED ORDER — SODIUM CHLORIDE 0.9 % IV SOLN
12.5000 mg | INTRAVENOUS | Status: DC | PRN
  Filled 2023-08-23: qty 0.5

## 2023-08-23 MED ORDER — ATROPINE SULFATE 1 MG/ML IV SOLN
INTRAVENOUS | Status: DC | PRN
Start: 2023-08-23 — End: 2023-08-23
  Administered 2023-08-23: 1 mg via INTRAVENOUS

## 2023-08-23 MED ORDER — PHENYLEPHRINE HCL-NACL 20-0.9 MG/250ML-% IV SOLN
INTRAVENOUS | Status: DC | PRN
  Administered 2023-08-23: 20 ug/min via INTRAVENOUS

## 2023-08-23 MED ORDER — CEFAZOLIN SODIUM-DEXTROSE 2-3 GM-%(50ML) IV SOLR
INTRAVENOUS | Status: DC | PRN
Start: 2023-08-23 — End: 2023-08-23
  Administered 2023-08-23: 2 g via INTRAVENOUS

## 2023-08-23 MED ORDER — METOPROLOL SUCCINATE ER 50 MG PO TB24: 100.0000 mg | ORAL_TABLET | Freq: Every day | ORAL | 3 refills | Status: DC

## 2023-08-23 MED ORDER — DEXAMETHASONE SODIUM PHOSPHATE 10 MG/ML IJ SOLN
INTRAMUSCULAR | Status: DC | PRN
  Administered 2023-08-23: 5 mg via INTRAVENOUS

## 2023-08-23 MED ORDER — EPHEDRINE SULFATE-NACL 50-0.9 MG/10ML-% IV SOSY
PREFILLED_SYRINGE | INTRAVENOUS | Status: DC | PRN
  Administered 2023-08-23: 10 mg via INTRAVENOUS
  Administered 2023-08-23 (×2): 5 mg via INTRAVENOUS

## 2023-08-23 MED ORDER — HEPARIN SODIUM (PORCINE) 1000 UNIT/ML IJ SOLN
INTRAMUSCULAR | Status: DC | PRN
  Administered 2023-08-23: 10000 [IU] via INTRAVENOUS

## 2023-08-23 MED ORDER — SODIUM CHLORIDE 0.9% FLUSH
3.0000 mL | INTRAVENOUS | Status: DC | PRN
Start: 2023-08-23 — End: 2023-08-23

## 2023-08-23 MED ORDER — SUGAMMADEX SODIUM 200 MG/2ML IV SOLN
INTRAVENOUS | Status: DC | PRN
  Administered 2023-08-23: 200 mg via INTRAVENOUS

## 2023-08-23 MED ORDER — OXYCODONE HCL 5 MG PO TABS: 5.0000 mg | ORAL_TABLET | Freq: Once | ORAL | Status: DC | PRN

## 2023-08-23 MED ORDER — AMISULPRIDE (ANTIEMETIC) 5 MG/2ML IV SOLN: 10.0000 mg | Freq: Once | INTRAVENOUS | Status: DC | PRN

## 2023-08-23 MED ORDER — SODIUM CHLORIDE 0.9 % IV SOLN: INTRAVENOUS | Status: DC

## 2023-08-23 MED ORDER — CEFAZOLIN SODIUM-DEXTROSE 2-4 GM/100ML-% IV SOLN
INTRAVENOUS | Status: AC
  Filled 2023-08-23: qty 100

## 2023-08-23 MED ORDER — PROPOFOL 10 MG/ML IV BOLUS
INTRAVENOUS | Status: DC | PRN
  Administered 2023-08-23: 120 mg via INTRAVENOUS

## 2023-08-23 NOTE — Progress Notes (Signed)
Client up and walked and tolerated well; bilat groins stable, no bleeding or hematoma °

## 2023-08-23 NOTE — Discharge Instructions (Signed)

## 2023-08-23 NOTE — Anesthesia Procedure Notes (Signed)
 Procedure Name: Intubation Date/Time: 08/23/2023 10:03 AM  Performed by: Virgil Ee, CRNAPre-anesthesia Checklist: Patient identified, Patient being monitored, Timeout performed, Emergency Drugs available and Suction available Patient Re-evaluated:Patient Re-evaluated prior to induction Oxygen Delivery Method: Circle system utilized Preoxygenation: Pre-oxygenation with 100% oxygen Induction Type: IV induction Ventilation: Mask ventilation without difficulty Laryngoscope Size: Mac and 4 Grade View: Grade I Tube type: Oral Tube size: 7.0 mm Number of attempts: 1 Airway Equipment and Method: Stylet Placement Confirmation: ETT inserted through vocal cords under direct vision, positive ETCO2 and breath sounds checked- equal and bilateral Secured at: 20 cm Tube secured with: Tape Dental Injury: Teeth and Oropharynx as per pre-operative assessment

## 2023-08-23 NOTE — H&P (Signed)
 Electrophysiology Office Note:    Date:  08/23/2023   ID:  Meredith [name], DOB [birth year], MRN [id number]  PCP:  Marvene Prentice SAUNDERS, FNP   [facility] Providers Cardiologist:  Stanly DELENA Leavens, MD Electrophysiologist:  OLE ONEIDA HOLTS, MD     Referring MD: No ref. provider found   History of Present Illness:    Meredith [name] is a 73 y.o. female with a medical history significant for paroxysmal atrial fibrillation, bipolar disorder on lithium  referred for management of atrial fibrillation.     She has been seen by Dr. HOLTS in the past for consideration of watchman due to labile INRs.  She was not thought to be an excellent candidate at the time due to labile INRs.  Discussed the use of AI scribe software for clinical note transcription with the patient, who gave verbal consent to proceed.  History of Present Illness Meredith [name] is a 73 year old female with atrial fibrillation who presents with weakness, fatigue, and palpitations.  She has a history of atrial fibrillation since 2000 and has undergone three ablations. The first two ablations were performed using radiofrequency, and the third was a cryoablation in 2016. The first ablation lasted one year, the second lasted until 2016, and the cryoablation has lasted until now. Initially, episodes of atrial fibrillation felt like a heart attack, but currently, symptoms include significant weakness, fatigue, and palpitations.  She was seen in the emergency room on May 09, 2023, where atrial flutter was noted. She believes she experiences both atrial fibrillation and atrial flutter. During these episodes, her heart rate is described as 'pretty fast,' although she does not count it.  Her current medications include metoprolol , which was recently increased from 50 mg to 100 mg, providing only minimal relief. She is also on warfarin for anticoagulation but has not been able to afford other anticoagulants.  She monitors her INR regularly, with a recent reading of 3.3, which is outside the therapeutic range for her condition.  She mentions having a twin brother who also has atrial fibrillation, suggesting a possible familial component to her condition.         I reviewed the patient's CT and labs. There was no LAA thrombus. Her INR has been therapeutic to slightly supratherapeutic prior to admission, and the INR is 3.1. There have been no changes in the patient's diagnoses, medications, or condition since our recent clinic visit.   EKGs/Labs/Other Studies Reviewed Today:     Echocardiogram:  TTE June 2022 EF 60 to 65%.  Left atrium mildly dilated.     EKG:       I reviewed all electrocardiograms available in [facility].  EKG April 8 shows an atypical appearing atrial flutter  Physical Exam:    VS:  BP (!) 150/87 (BP Location: Left Arm)   Pulse 85   Temp 99.4 F (37.4 C) (Oral)   Resp (!) 21   Ht 5' (1.524 m)   Wt 86.2 kg   LMP  (LMP Unknown)   SpO2 97%   BMI 37.11 kg/m     Wt Readings from Last 3 Encounters:  08/23/23 86.2 kg  06/05/23 86.6 kg  06/04/23 84.8 kg     GEN: Well nourished, well developed in no acute distress CARDIAC: RRR, no murmurs, rubs, gallops RESPIRATORY:  Normal work of breathing MUSCULOSKELETAL: no edema    ASSESSMENT & PLAN:     Paroxysmal atrial fibrillation, paroxysmal atrial flutter Symptomatic with palpitations, malaise Heart rates are  controlled Status post 3 ablations -- two in the distant past We discussed management options.  Using a shared decision making approach, we decided to proceed with scheduling of ablation of atrial fibrillation and flutter.  We discussed the indication, rationale, logistics, anticipated benefits, and potential risks of the ablation procedure including but not limited to -- bleed at the groin access site, chest pain, damage to nearby organs such as the diaphragm, lungs, or esophagus, need for a drainage tube,  or prolonged hospitalization. I explained that the risk for stroke, heart attack, need for open chest surgery, or even death is very low but not zero. she  expressed understanding and wishes to proceed.   Secondary hypercoagulable state On warfarin We are investigating getting her on patient assistance for eliquis  Hopefully, we can transition her to Eliquis  at least a month prior to the ablation and for 3 months following     Signed, Eulas FORBES Furbish, MD  08/23/2023 9:32 AM    [facility]

## 2023-08-23 NOTE — Progress Notes (Signed)
 Chronic back pain6/10- takes hydrocodine (7.5/325mg ) takes 3 times a day- goes to pain management . Report to Marval, RN - pt neurologically intact.

## 2023-08-23 NOTE — Anesthesia Preprocedure Evaluation (Addendum)
 Anesthesia Evaluation  Patient identified by MRN, date of birth, ID band Patient awake    Reviewed: Allergy & Precautions, NPO status , Patient's Chart, lab work & pertinent test results  Airway Mallampati: II       Dental  (+) Edentulous Upper, Edentulous Lower   Pulmonary neg pulmonary ROS   Pulmonary exam normal        Cardiovascular + dysrhythmias (on Coumadin , last dose 10/14) Atrial Fibrillation  Rhythm:Irregular Rate:Normal     Neuro/Psych  Headaches  Anxiety Depression Bipolar Disorder      GI/Hepatic Neg liver ROS,GERD  Medicated,,  Endo/Other  diabetesHypothyroidism    Renal/GU CRFRenal disease  negative genitourinary   Musculoskeletal  (+) Arthritis , Osteoarthritis,  Right foot hammer toe   Abdominal  (+) + obese  Peds  Hematology  (+) Blood dyscrasia, anemia   Anesthesia Other Findings   Reproductive/Obstetrics                             Anesthesia Physical Anesthesia Plan  ASA: 3  Anesthesia Plan: General   Post-op Pain Management: Minimal or no pain anticipated   Induction: Intravenous  PONV Risk Score and Plan: 3 and Ondansetron , Dexamethasone , Treatment may vary due to age or medical condition and Midazolam   Airway Management Planned: Oral ETT  Additional Equipment: None  Intra-op Plan:   Post-operative Plan: Extubation in OR  Informed Consent: I have reviewed the patients History and Physical, chart, labs and discussed the procedure including the risks, benefits and alternatives for the proposed anesthesia with the patient or authorized representative who has indicated his/her understanding and acceptance.     Dental advisory given  Plan Discussed with: CRNA  Anesthesia Plan Comments:         Anesthesia Quick Evaluation

## 2023-08-23 NOTE — Transfer of Care (Signed)
 Immediate Anesthesia Transfer of Care Note  Patient: Meredith [name]  Procedure(s) Performed: ATRIAL FIBRILLATION ABLATION  Patient Location: PACU  Anesthesia Type:General  Level of Consciousness: awake, alert , oriented, and patient cooperative  Airway & Oxygen Therapy: Patient Spontanous Breathing and Patient connected to face mask oxygen  Post-op Assessment: Report given to RN, Post -op Vital signs reviewed and stable, Patient moving all extremities, Patient moving all extremities X 4, and Patient able to stick tongue midline  Post vital signs: Reviewed and stable  Last Vitals:  Vitals Value Taken Time  BP    Temp    Pulse 89 08/23/23 11:36  Resp 23 08/23/23 11:36  SpO2 96 % 08/23/23 11:36  Vitals shown include unfiled device data.  Last Pain:  Vitals:   08/23/23 0809  TempSrc:   PainSc: 6          Complications: No notable events documented.

## 2023-08-23 NOTE — Anesthesia Postprocedure Evaluation (Signed)
 Anesthesia Post Note  Patient: Meredith [name]  Procedure(s) Performed: ATRIAL FIBRILLATION ABLATION     Patient location during evaluation: PACU Anesthesia Type: General Level of consciousness: awake and alert Pain management: pain level controlled Vital Signs Assessment: post-procedure vital signs reviewed and stable Respiratory status: spontaneous breathing, nonlabored ventilation and respiratory function stable Cardiovascular status: blood pressure returned to baseline and stable Postop Assessment: no apparent nausea or vomiting Anesthetic complications: no   No notable events documented.  Last Vitals:  Vitals:   08/23/23 1215 08/23/23 1252  BP: 108/61   Pulse: 86   Resp: 14   Temp:    SpO2: 97% (!) 88%    Last Pain:  Vitals:   08/23/23 1213  TempSrc: Oral  PainSc: 5                  Butler Levander Pinal

## 2023-08-25 ENCOUNTER — Encounter (HOSPITAL_COMMUNITY): Payer: Self-pay | Admitting: Cardiovascular Disease

## 2023-08-26 ENCOUNTER — Telehealth (HOSPITAL_COMMUNITY): Payer: Self-pay

## 2023-08-26 MED FILL — Fentanyl Citrate Preservative Free (PF) Inj 100 MCG/2ML: INTRAMUSCULAR | Qty: 2 | Status: AC

## 2023-08-26 NOTE — Telephone Encounter (Signed)
 Patient returned call to complete post procedure follow up call.  Patient reports no complications with groin sites.   Instructions reviewed with patient:  It is normal to have bruising, tenderness, mild swelling, and a pea or marble sized lump/knot at the groin site which can take up to three months to resolve.  Get help right away if you notice sudden swelling at the puncture site.  Check your puncture site every day for signs of infection: fever, redness, swelling, pus drainage, warmth, foul odor or excessive pain. If this occurs, please call the office at [phone], to speak with the nurse. Get help right away if your puncture site is bleeding and the bleeding does not stop after applying firm pressure to the area.  You may continue to have skipped beats/ atrial fibrillation during the first several months after your procedure.  It is very important not to miss any doses of your blood thinner Warfarin.    You will follow up with the Afib clinic on 09/19/23 and follow up with Dr.Augustus Mealor on 11/20/23.    Patient verbalized understanding to all instructions provided.

## 2023-08-26 NOTE — Progress Notes (Signed)
 See anticoag encounter

## 2023-08-26 NOTE — Telephone Encounter (Signed)
 Attempted to reach patient to follow up with procedure completed on 08/23/23, no answer. Left VM for patient to return call.

## 2023-08-27 ENCOUNTER — Ambulatory Visit (INDEPENDENT_AMBULATORY_CARE_PROVIDER_SITE_OTHER): Admitting: Psychology

## 2023-08-27 DIAGNOSIS — F3181 Bipolar II disorder: Secondary | ICD-10-CM | POA: Diagnosis not present

## 2023-08-27 DIAGNOSIS — F319 Bipolar disorder, unspecified: Secondary | ICD-10-CM

## 2023-08-27 NOTE — Progress Notes (Signed)
 Meredith [name] is a 73 y.o. female patient   Date: 08/27/2023  Treatment Plan: Diagnosis F31.81 (Bipolar II disorder) [n/a]  Symptoms Depressed or irritable mood. (Status: maintained) -- No Description Entered  Lack of energy. (Status: maintained) -- No Description Entered  Social withdrawal. (Status: maintained) -- No Description Entered  Medication Status compliance  Safety none  If Suicidal or Homicidal State Action Taken: unspecified  Current Risk: low Medications Abilify  (Dosage: 30mg )  Hydocodone (Dosage: 7.5mg )  Lamotrigine  (Dosage: 200mg /day)  Lithium  (Dosage: 450mg /day)  Seroquel  (Dosage: 200mg /day)  Objectives Related Problem: Develop healthy cognitive patterns and beliefs about self and the world that lead to alleviation and help prevent the relapse of mood episodes. Description: Verbalize grief, fear, and anger regarding real or imagined losses in life. Target Date: 2024-02-09 Frequency: Daily Modality: individual Progress: 85%  Related Problem: Develop healthy cognitive patterns and beliefs about self and the world that lead to alleviation and help prevent the relapse of mood episodes. Description: Take prescribed medications as directed. Target Date: 2022-02-08 Frequency: Daily Modality: individual Progress: 100%  Related Problem: Develop healthy cognitive patterns and beliefs about self and the world that lead to alleviation and help prevent the relapse of mood episodes. Description: Describe mood state, energy level, amount of control over thoughts, and sleeping pattern. Target Date: 2024-02-09 Frequency: Daily Modality: individual Progress: 80%  Client Response full compliance  Service Location Location, [address] [place], [place] [address]  Service  Code cpt [phone] P Neuropsych. testing  Related past to present  Self-monitoring  Self care activities  Emotion regulation skills  Validate/empathize  Facilitate problem solving  Normalize/Reframe  Journaling  Comments  Dx.: F31.81  Goals: States she is seeking emotional stability. She needs guidance in managing some of her physical/medical challenges and limitations. Also, have some interpersonal challenges and seeks feedback on addressing these difficulties.Revised target date is 12-25.   Meds: Seroquel  (300mg  daily), Hydrocodone  (7.5 mg up to 3x/day), Buspar  (10mg  twice daily).   Patient agrees to be seen for a video Public affairs consultant) appointment and understands the limitations of this platform. She was at home and provider was in his office.  Meredith [name] had her ablation on Friday and it went well. She was a little sore post procedure, but is doing well and states she is feeling much better. No Afib symptoms to date. Says her moods are stable though she does have some ups and downs. Nothing severe and she is mostly feeling good and positive about her future. Does get a bit lonely at times. Is trying to stay active.                        [name]  CONI ALM KERNS, PhD Time: 1:15p-2:00p 45 minutes

## 2023-08-27 NOTE — Progress Notes (Signed)
 Anticoag encounter

## 2023-08-28 ENCOUNTER — Encounter: Payer: Self-pay | Admitting: Emergency Medicine

## 2023-08-29 ENCOUNTER — Ambulatory Visit: Attending: Cardiovascular Disease | Admitting: Pharmacist

## 2023-08-29 DIAGNOSIS — Z5181 Encounter for therapeutic drug level monitoring: Secondary | ICD-10-CM | POA: Insufficient documentation

## 2023-08-29 DIAGNOSIS — I480 Paroxysmal atrial fibrillation: Secondary | ICD-10-CM | POA: Diagnosis present

## 2023-08-29 LAB — POCT INR: INR: 3.9 — AB (ref 2.0–3.0)

## 2023-08-29 NOTE — Patient Instructions (Signed)
 Description   Hold dose tomorrow and then reduce dose to 1 tablet daily.   Call with any medication changes or if you are scheduled for surgery  Coumadin  Clinic [phone] Main [phone]

## 2023-08-29 NOTE — Progress Notes (Signed)
Please see anticoagulation encounter.

## 2023-09-10 ENCOUNTER — Ambulatory Visit (INDEPENDENT_AMBULATORY_CARE_PROVIDER_SITE_OTHER): Admitting: Psychology

## 2023-09-10 DIAGNOSIS — F3181 Bipolar II disorder: Secondary | ICD-10-CM | POA: Diagnosis not present

## 2023-09-10 DIAGNOSIS — F319 Bipolar disorder, unspecified: Secondary | ICD-10-CM

## 2023-09-10 NOTE — Progress Notes (Signed)
 Meredith [name] is a 73 y.o. female patient   Date: 09/10/2023  Treatment Plan: Diagnosis F31.81 (Bipolar II disorder) [n/a]  Symptoms Depressed or irritable mood. (Status: maintained) -- No Description Entered  Lack of energy. (Status: maintained) -- No Description Entered  Social withdrawal. (Status: maintained) -- No Description Entered  Medication Status compliance  Safety none  If Suicidal or Homicidal State Action Taken: unspecified  Current Risk: low Medications Abilify  (Dosage: 30mg )  Hydocodone (Dosage: 7.5mg )  Lamotrigine  (Dosage: 200mg /day)  Lithium  (Dosage: 450mg /day)  Seroquel  (Dosage: 200mg /day)  Objectives Related Problem: Develop healthy cognitive patterns and beliefs about self and the world that lead to alleviation and help prevent the relapse of mood episodes. Description: Verbalize grief, fear, and anger regarding real or imagined losses in life. Target Date: 2024-02-09 Frequency: Daily Modality: individual Progress: 85%  Related Problem: Develop healthy cognitive patterns and beliefs about self and the world that lead to alleviation and help prevent the relapse of mood episodes. Description: Take prescribed medications as directed. Target Date: 2022-02-08 Frequency: Daily Modality: individual Progress: 100%  Related Problem: Develop healthy cognitive patterns and beliefs about self and the world that lead to alleviation and help prevent the relapse of mood episodes. Description: Describe mood state, energy level, amount of control over thoughts, and sleeping pattern. Target Date: 2024-02-09 Frequency: Daily Modality: individual Progress: 80%  Client Response full compliance  Service Location Location, [address]  [place], [place] [address]  Service Code cpt [phone] P Neuropsych. testing  Related past to present  Self-monitoring  Self care activities  Emotion regulation skills  Validate/empathize  Facilitate problem solving  Normalize/Reframe  Journaling  Comments  Dx.: F31.81  Goals: States she is seeking emotional stability. She needs guidance in managing some of her physical/medical challenges and limitations. Also, have some interpersonal challenges and seeks feedback on addressing these difficulties.Revised target date is 12-25.   Meds: Seroquel  (300mg  daily), Hydrocodone  (7.5 mg up to 3x/day), Buspar  (10mg  twice daily).   Patient agrees to be seen for a video Public affairs consultant) appointment and understands the limitations of this platform. She was at home and provider was in his office.  Meredith [name] says that she has had some frustrations with her church and the support she has received during her cardiac procedure. She has talked with them and has mostly resolved her concerns. She does state she is feeling okay and more stable. No significant episodes of feeling depressed.  Is managing her circumstances well at this itme                          .  CONI ALM KERNS, PhD Time: 1:15p-2:00p 45 minutes

## 2023-09-12 ENCOUNTER — Other Ambulatory Visit: Payer: Self-pay | Admitting: Psychiatry

## 2023-09-12 ENCOUNTER — Ambulatory Visit: Attending: Cardiovascular Disease

## 2023-09-12 DIAGNOSIS — Z5181 Encounter for therapeutic drug level monitoring: Secondary | ICD-10-CM | POA: Diagnosis present

## 2023-09-12 DIAGNOSIS — F4310 Post-traumatic stress disorder, unspecified: Secondary | ICD-10-CM

## 2023-09-12 DIAGNOSIS — I480 Paroxysmal atrial fibrillation: Secondary | ICD-10-CM | POA: Insufficient documentation

## 2023-09-12 DIAGNOSIS — F4001 Agoraphobia with panic disorder: Secondary | ICD-10-CM

## 2023-09-12 DIAGNOSIS — F411 Generalized anxiety disorder: Secondary | ICD-10-CM

## 2023-09-12 LAB — POCT INR: INR: 2.6 (ref 2.0–3.0)

## 2023-09-12 NOTE — Progress Notes (Signed)
Please see anticoagulation encounter.

## 2023-09-12 NOTE — Patient Instructions (Signed)
 Description   Continue taking 1 tablet daily.  Call with any medication changes or if you are scheduled for surgery  Coumadin  Clinic [phone] Main [phone]

## 2023-09-20 ENCOUNTER — Encounter (HOSPITAL_COMMUNITY): Payer: Self-pay | Admitting: Physician Assistant

## 2023-09-20 ENCOUNTER — Ambulatory Visit (HOSPITAL_COMMUNITY)
Admission: RE | Admit: 2023-09-20 | Discharge: 2023-09-20 | Disposition: A | Discharge status: Home / Self Care | Source: Ambulatory Visit | Attending: Physician Assistant | Admitting: Physician Assistant

## 2023-09-20 VITALS — BP 108/68 | HR 77 | Ht 60.0 in | Wt 193.0 lb

## 2023-09-20 DIAGNOSIS — I484 Atypical atrial flutter: Secondary | ICD-10-CM | POA: Diagnosis present

## 2023-09-20 DIAGNOSIS — I480 Paroxysmal atrial fibrillation: Secondary | ICD-10-CM | POA: Insufficient documentation

## 2023-09-20 MED ORDER — METOPROLOL SUCCINATE ER 50 MG PO TB24: 50.0000 mg | ORAL_TABLET | Freq: Every day | ORAL | 3 refills | Status: DC

## 2023-09-20 NOTE — Progress Notes (Signed)
 Primary Care Physician: Marvene Prentice SAUNDERS, FNP Primary Cardiologist: Stanly DELENA Leavens, MD Electrophysiologist: Eulas FORBES Furbish, MD  Referring Physician: Dr Furbish Kay Faith Patricelli is a 73 y.o. female with a history of bipolar disorder, CKD, HLD, hypothyroidism, atrial flutter, atrial fibrillation who presents for follow up in the [facility].  She has a history of atrial fibrillation since 2000 and has undergone three ablations. The first two ablations were performed using radiofrequency, and the third was a cryoablation in 2016.  She was seen in the emergency room on May 09, 2023, where atrial flutter was noted. She was seen by Dr Furbish and underwent repeat ablation on 08/23/23. Patient is on warfarin for stroke prevention.    Patient presents today for follow up for atrial fibrillation. She remains in SR and feels well. She does report that her lightheadedness had improved but not completely resolved. She denies any palpitations, chest pain, or groin issues. No bleeding issues on anticoagulation.   Today, she denies symptoms of palpitations, chest pain, shortness of breath, orthopnea, PND, lower extremity edema, presyncope, syncope, snoring, daytime somnolence, bleeding, or neurologic sequela. The patient is tolerating medications without difficulties and is otherwise without complaint today.    Atrial Fibrillation Risk Factors:  she does not have symptoms or diagnosis of sleep apnea. she does not have a history of rheumatic fever.   Atrial Fibrillation Management history:  Previous antiarrhythmic drugs: none Previous cardioversions: 2015 x 2 Previous ablations: 08/23/23 Anticoagulation history: warfarin  ROS- All systems are reviewed and negative except as per the HPI above.  Past Medical History:  Diagnosis Date   Anxiety    Atrial fibrillation (HCC)    Atrial fibrillation (HCC)    Bipolar 2 disorder (HCC)    Chronic kidney disease     Depression    GERD (gastroesophageal reflux disease)    Hematoma 07/06/2020   History of cardioversion    x2   History of cardioversion    Hyperlipidemia    Hypothyroidism    Ingrown toenail 10/10/2020   Migraine headache    Osteomyelitis of foot (HCC) 11/11/2020   Osteoporosis    Pre-diabetes    Septic arthritis of interphalangeal joint of toe of right foot (HCC) 07/06/2020    Current Outpatient Medications  Medication Sig Dispense Refill   alendronate (FOSAMAX) 35 MG tablet Take 35 mg by mouth every Thursday.     ascorbic acid (VITAMIN C) 500 MG tablet Take 500 mg by mouth daily.     aspirin  325 MG tablet Take 325 mg by mouth daily as needed for headache.      busPIRone  (BUSPAR ) 15 MG tablet Take 1 tablet by mouth twice daily 180 tablet 0   Cholecalciferol (VITAMIN D3) 125 MCG (5000 UT) TABS Take 1 tablet by mouth daily.     EPINEPHrine  0.3 mg/0.3 mL IJ SOAJ injection Inject 0.3 mg into the muscle as needed for anaphylaxis.     estradiol  (ESTRACE ) 1 MG tablet Take 1 mg by mouth daily.     ferrous sulfate  325 (65 FE) MG tablet Take 325 mg by mouth 2 (two) times daily.     furosemide  (LASIX ) 20 MG tablet Take 1 tablet (20 mg total) by mouth daily as needed. 30 tablet 11   Galcanezumab-gnlm (EMGALITY) 120 MG/ML SOAJ Inject 120 mg into the skin every 30 (thirty) days.     HYDROcodone -acetaminophen  (NORCO) 7.5-325 MG tablet Take 1 tablet by mouth 3 (three) times daily as  needed for moderate pain or severe pain.     ipratropium (ATROVENT) 0.03 % nasal spray Place 1 spray into both nostrils 3 (three) times daily.     levocetirizine (XYZAL) 5 MG tablet Take 5 mg by mouth daily.     levothyroxine  (SYNTHROID ) 112 MCG tablet Take 112 mcg by mouth daily before breakfast.     metoprolol  succinate (TOPROL -XL) 50 MG 24 hr tablet Take 2 tablets (100 mg total) by mouth daily. 30 tablet 3   Multiple Vitamin (MULTIVITAMIN WITH MINERALS) TABS tablet Take 1 tablet by mouth daily.     naloxone  (NARCAN) 0.4 MG/ML injection Inject into the skin as needed.     ondansetron  (ZOFRAN ) 4 MG tablet Take 1 tablet (4 mg total) by mouth every 8 (eight) hours as needed for nausea or vomiting. 20 tablet 0   pantoprazole  (PROTONIX ) 40 MG tablet Take 1 tablet (40 mg total) by mouth daily. 90 tablet 0   pravastatin  (PRAVACHOL ) 40 MG tablet Take 40 mg by mouth daily.     pregabalin  (LYRICA ) 150 MG capsule Take 150 mg by mouth 3 (three) times daily.     QUEtiapine  (SEROQUEL ) 300 MG tablet Take 1 tablet (300 mg total) by mouth at bedtime. 90 tablet 1   senna (SENOKOT) 8.6 MG TABS tablet Take 3 tablets by mouth daily as needed for mild constipation.     verapamil  (CALAN -SR) 120 MG CR tablet Take 120 mg by mouth at bedtime.     vitamin B-12 1000 MCG tablet Take 1 tablet (1,000 mcg total) by mouth daily.     warfarin (COUMADIN ) 2.5 MG tablet TAKE 1 TO 1 & 1/2 (ONE TO ONE & ONE-HALF) TABLETS BY MOUTH ONCE DAILY AS DIRECTED BY  COUMADIN   CLINIC. (Patient taking differently: Take 2.5-3.75 mg by mouth See admin instructions. Take 2.5 mg daily except take 3.75 mg on Tuesday) 120 tablet 0   No current facility-administered medications for this encounter.    Physical Exam: BP 108/68   Pulse 77   Ht 5' (1.524 m)   Wt 87.5 kg   LMP  (LMP Unknown)   BMI 37.69 kg/m   GEN: Well nourished, well developed in no acute distress CARDIAC: Regular rate and rhythm, no murmurs, rubs, gallops RESPIRATORY:  Clear to auscultation without rales, wheezing or rhonchi  ABDOMEN: Soft, non-tender, non-distended EXTREMITIES:  No edema; No deformity   Wt Readings from Last 3 Encounters:  09/20/23 87.5 kg  08/23/23 86.2 kg  06/05/23 86.6 kg     EKG today demonstrates  SR, 1st degree AV block Vent. rate 77 BPM PR interval 226 ms QRS duration 74 ms QT/QTcB 386/436 ms   Echo 08/18/20 demonstrated   1. Left ventricular ejection fraction, by estimation, is 60 to 65%. The  left ventricle has normal function. The left  ventricle has no regional  wall motion abnormalities. Left ventricular diastolic function could not  be evaluated.   2. Right ventricular systolic function is normal. The right ventricular  size is normal. There is mildly elevated pulmonary artery systolic  pressure.   3. Left atrial size was mildly dilated.   4. The mitral valve is normal in structure. Mild mitral valve  regurgitation. No evidence of mitral stenosis.   5. The aortic valve is tricuspid. Aortic valve regurgitation is mild. No  aortic stenosis is present.    CHA2DS2-VASc Score = 2  The patient's score is based upon: CHF History: 0 HTN History: 0 Diabetes History: 0 Stroke History:  0 Vascular Disease History: 0 Age Score: 1 Gender Score: 1       ASSESSMENT AND PLAN: Paroxysmal Atrial Fibrillation/atypical atrial flutter (ICD10:  I48.0) The patient's CHA2DS2-VASc score is 2, indicating a 2.2% annual risk of stroke.   S/p RF ablation x 2 and cryo x 1 remotely. S/p PFA 08/23/23 with Dr Nancey. Patient appears to be maintaining SR. We briefly discussed ILR for long term monitoring given her intermittent lightheadedness. She plans to discuss with Dr Nancey Continue warfarin Continue Toprol  50 mg daily Continue verapamil  120 mg daily   Follow up with Dr Mealor as scheduled.        [facility] [address] [place], [place] [address] [phone]

## 2023-09-20 NOTE — Patient Instructions (Signed)
Loop recorder 

## 2023-09-24 ENCOUNTER — Ambulatory Visit (INDEPENDENT_AMBULATORY_CARE_PROVIDER_SITE_OTHER): Admitting: Psychology

## 2023-09-24 DIAGNOSIS — F3181 Bipolar II disorder: Secondary | ICD-10-CM

## 2023-09-24 DIAGNOSIS — F319 Bipolar disorder, unspecified: Secondary | ICD-10-CM

## 2023-09-24 NOTE — Progress Notes (Signed)
 Meredith [name] is a 73 y.o. female patient   Date: 09/24/2023  Treatment Plan: Diagnosis F31.81 (Bipolar II disorder) [n/a]  Symptoms Depressed or irritable mood. (Status: maintained) -- No Description Entered  Lack of energy. (Status: maintained) -- No Description Entered  Social withdrawal. (Status: maintained) -- No Description Entered  Medication Status compliance  Safety none  If Suicidal or Homicidal State Action Taken: unspecified  Current Risk: low Medications Abilify  (Dosage: 30mg )  Hydocodone (Dosage: 7.5mg )  Lamotrigine  (Dosage: 200mg /day)  Lithium  (Dosage: 450mg /day)  Seroquel  (Dosage: 200mg /day)  Objectives Related Problem: Develop healthy cognitive patterns and beliefs about self and the world that lead to alleviation and help prevent the relapse of mood episodes. Description: Verbalize grief, fear, and anger regarding real or imagined losses in life. Target Date: 2024-02-09 Frequency: Daily Modality: individual Progress: 85%  Related Problem: Develop healthy cognitive patterns and beliefs about self and the world that lead to alleviation and help prevent the relapse of mood episodes. Description: Take prescribed medications as directed. Target Date: 2022-02-08 Frequency: Daily Modality: individual Progress: 100%  Related Problem: Develop healthy cognitive patterns and beliefs about self and the world that lead to alleviation and help prevent the relapse of mood episodes. Description: Describe mood state, energy level, amount of control over thoughts, and sleeping pattern. Target Date: 2024-02-09 Frequency: Daily Modality: individual Progress: 80%  Client Response full compliance  Service Location Location,  [address] [place], [place] [address]  Service Code cpt [phone] P Neuropsych. testing  Related past to present  Self-monitoring  Self care activities  Emotion regulation skills  Validate/empathize  Facilitate problem solving  Normalize/Reframe  Journaling  Comments  Dx.: F31.81  Goals: States she is seeking emotional stability. She needs guidance in managing some of her physical/medical challenges and limitations. Also, have some interpersonal challenges and seeks feedback on addressing these difficulties.Revised target date is 12-25.   Meds: Seroquel  (300mg  daily), Hydrocodone  (7.5 mg up to 3x/day), Buspar  (10mg  twice daily).   Patient agrees to be seen for a video Public affairs consultant) appointment and understands the limitations of this platform. She was at home and provider was in his office.  Meredith [name] says that she had her follow-up appointment at [facility] clinic and all is well. Has not had any Afib episodes, but experiences light-headedness. They are not sure why she is having this, but will check with primary care doctor in September. She is mostly feeling good and much better since her ablation. She talked about her church involvement, which has lessened over the past year. Does not anticipate greater involvement, but will continue to attend services.                             [name]  CONI ALM KERNS, PhD Time: 1:15p-2:00p 45 minutes

## 2023-10-01 ENCOUNTER — Other Ambulatory Visit: Payer: Self-pay | Admitting: Family Medicine

## 2023-10-01 DIAGNOSIS — Z1231 Encounter for screening mammogram for malignant neoplasm of breast: Secondary | ICD-10-CM

## 2023-10-03 ENCOUNTER — Ambulatory Visit: Attending: Cardiovascular Disease | Admitting: *Deleted

## 2023-10-03 DIAGNOSIS — I480 Paroxysmal atrial fibrillation: Secondary | ICD-10-CM | POA: Insufficient documentation

## 2023-10-03 DIAGNOSIS — Z5181 Encounter for therapeutic drug level monitoring: Secondary | ICD-10-CM | POA: Diagnosis present

## 2023-10-03 LAB — POCT INR: POC INR: 2.4

## 2023-10-03 NOTE — Patient Instructions (Signed)
 Description   Continue taking 1 tablet daily.  Recheck INR in 4 weeks.  Call with any medication changes or if you are scheduled for surgery  Coumadin  Clinic [phone] Main [phone]

## 2023-10-03 NOTE — Progress Notes (Signed)
 INR-2.4 Please see anticoagulation encounter.

## 2023-10-08 ENCOUNTER — Ambulatory Visit (INDEPENDENT_AMBULATORY_CARE_PROVIDER_SITE_OTHER): Admitting: Psychology

## 2023-10-08 DIAGNOSIS — F3181 Bipolar II disorder: Secondary | ICD-10-CM

## 2023-10-08 DIAGNOSIS — F319 Bipolar disorder, unspecified: Secondary | ICD-10-CM

## 2023-10-08 NOTE — Progress Notes (Signed)
 Meredith [name] is a 73 y.o. female patient   Date: 10/08/2023  Treatment Plan: Diagnosis F31.81 (Bipolar II disorder) [n/a]  Symptoms Depressed or irritable mood. (Status: maintained) -- No Description Entered  Lack of energy. (Status: maintained) -- No Description Entered  Social withdrawal. (Status: maintained) -- No Description Entered  Medication Status compliance  Safety none  If Suicidal or Homicidal State Action Taken: unspecified  Current Risk: low Medications Abilify  (Dosage: 30mg )  Hydocodone (Dosage: 7.5mg )  Lamotrigine  (Dosage: 200mg /day)  Lithium  (Dosage: 450mg /day)  Seroquel  (Dosage: 200mg /day)  Objectives Related Problem: Develop healthy cognitive patterns and beliefs about self and the world that lead to alleviation and help prevent the relapse of mood episodes. Description: Verbalize grief, fear, and anger regarding real or imagined losses in life. Target Date: 2024-02-09 Frequency: Daily Modality: individual Progress: 85%  Related Problem: Develop healthy cognitive patterns and beliefs about self and the world that lead to alleviation and help prevent the relapse of mood episodes. Description: Take prescribed medications as directed. Target Date: 2022-02-08 Frequency: Daily Modality: individual Progress: 100%  Related Problem: Develop healthy cognitive patterns and beliefs about self and the world that lead to alleviation and help prevent the relapse of mood episodes. Description: Describe mood state, energy level, amount of control over thoughts, and sleeping pattern. Target Date: 2024-02-09 Frequency: Daily Modality: individual Progress: 80%  Client Response full compliance   Service Location Location, [address] [place], [place] [address]  Service Code cpt [phone] P Neuropsych. testing  Related past to present  Self-monitoring  Self care activities  Emotion regulation skills  Validate/empathize  Facilitate problem solving  Normalize/Reframe  Journaling  Comments  Dx.: F31.81  Goals: States she is seeking emotional stability. She needs guidance in managing some of her physical/medical challenges and limitations. Also, have some interpersonal challenges and seeks feedback on addressing these difficulties.Revised target date is 12-25.   Meds: Seroquel  (300mg  daily), Hydrocodone  (7.5 mg up to 3x/day), Buspar  (10mg  twice daily).   Patient agrees to be seen for a video Public affairs consultant) appointment and understands the limitations of this platform. She was at home and provider was in his office.  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 She is upset with all that is happening and it may compromise her involvement. Meredith [name] says she had to borrow money from her brother for the first time in a very long time. He was helpful, but she felt bad about running short on money. Will pay back in short order. She reports feeling emotionally stable, though guilty about having to get money from brother.                                Meredith  CONI ALM KERNS, PhD Time: 1:15p-2:00p 45 minutes

## 2023-10-12 ENCOUNTER — Other Ambulatory Visit: Payer: Self-pay

## 2023-10-12 ENCOUNTER — Emergency Department (HOSPITAL_BASED_OUTPATIENT_CLINIC_OR_DEPARTMENT_OTHER)

## 2023-10-12 ENCOUNTER — Emergency Department (HOSPITAL_BASED_OUTPATIENT_CLINIC_OR_DEPARTMENT_OTHER): Admission: EM | Admit: 2023-10-12 | Discharge: 2023-10-12 | Disposition: A | Discharge status: Home / Self Care

## 2023-10-12 ENCOUNTER — Encounter (HOSPITAL_BASED_OUTPATIENT_CLINIC_OR_DEPARTMENT_OTHER): Payer: Self-pay | Admitting: Urology

## 2023-10-12 DIAGNOSIS — Z79899 Other long term (current) drug therapy: Secondary | ICD-10-CM | POA: Insufficient documentation

## 2023-10-12 DIAGNOSIS — Z7901 Long term (current) use of anticoagulants: Secondary | ICD-10-CM | POA: Diagnosis not present

## 2023-10-12 DIAGNOSIS — W1839XA Other fall on same level, initial encounter: Secondary | ICD-10-CM | POA: Insufficient documentation

## 2023-10-12 DIAGNOSIS — I4891 Unspecified atrial fibrillation: Secondary | ICD-10-CM | POA: Diagnosis not present

## 2023-10-12 DIAGNOSIS — I10 Essential (primary) hypertension: Secondary | ICD-10-CM | POA: Diagnosis not present

## 2023-10-12 DIAGNOSIS — M545 Low back pain, unspecified: Secondary | ICD-10-CM | POA: Insufficient documentation

## 2023-10-12 DIAGNOSIS — G8929 Other chronic pain: Secondary | ICD-10-CM | POA: Insufficient documentation

## 2023-10-12 DIAGNOSIS — W19XXXA Unspecified fall, initial encounter: Secondary | ICD-10-CM

## 2023-10-12 LAB — COMPREHENSIVE METABOLIC PANEL WITH GFR
ALT: 14 U/L (ref 0–44)
AST: 23 U/L (ref 15–41)
Albumin: 4.4 g/dL (ref 3.5–5.0)
Alkaline Phosphatase: 55 U/L (ref 38–126)
Anion gap: 14 (ref 5–15)
BUN: 11 mg/dL (ref 8–23)
CO2: 23 mmol/L (ref 22–32)
Calcium: 10.1 mg/dL (ref 8.9–10.3)
Chloride: 99 mmol/L (ref 98–111)
Creatinine, Ser: 0.96 mg/dL (ref 0.44–1.00)
GFR, Estimated: >60 mL/min (ref >60)
Glucose, Bld: 113 mg/dL — ABNORMAL HIGH (ref 70–99)
Potassium: 3.9 mmol/L (ref 3.5–5.1)
Sodium: 135 mmol/L (ref 135–145)
Total Bilirubin: 0.6 mg/dL (ref 0.0–1.2)
Total Protein: 7.2 g/dL (ref 6.5–8.1)

## 2023-10-12 LAB — CBC WITH DIFFERENTIAL/PLATELET
Abs Immature Granulocytes: 0.04 K/uL (ref 0.00–0.07)
Basophils Absolute: 0 K/uL (ref 0.0–0.1)
Basophils Relative: 0 %
Eosinophils Absolute: 0 K/uL (ref 0.0–0.5)
Eosinophils Relative: 0 %
HCT: 38 % (ref 36.0–46.0)
Hemoglobin: 13 g/dL (ref 12.0–15.0)
Immature Granulocytes: 0 %
Lymphocytes Relative: 10 %
Lymphs Abs: 1.3 K/uL (ref 0.7–4.0)
MCH: 30.5 pg (ref 26.0–34.0)
MCHC: 34.2 g/dL (ref 30.0–36.0)
MCV: 89.2 fL (ref 80.0–100.0)
Monocytes Absolute: 0.9 K/uL (ref 0.1–1.0)
Monocytes Relative: 7 %
Neutro Abs: 9.9 K/uL — ABNORMAL HIGH (ref 1.7–7.7)
Neutrophils Relative %: 83 %
Platelets: 193 K/uL (ref 150–400)
RBC: 4.26 MIL/uL (ref 3.87–5.11)
RDW: 13.5 % (ref 11.5–15.5)
WBC: 12.1 K/uL — ABNORMAL HIGH (ref 4.0–10.5)
nRBC: 0 % (ref 0.0–0.2)

## 2023-10-12 LAB — URINALYSIS, ROUTINE W REFLEX MICROSCOPIC
Bilirubin Urine: NEGATIVE
Glucose, UA: NEGATIVE mg/dL
Hgb urine dipstick: NEGATIVE
Ketones, ur: NEGATIVE mg/dL
Leukocytes,Ua: NEGATIVE
Nitrite: NEGATIVE
Protein, ur: NEGATIVE mg/dL
Specific Gravity, Urine: 1.007 (ref 1.005–1.030)
pH: 7 (ref 5.0–8.0)

## 2023-10-12 LAB — MAGNESIUM: Magnesium: 2 mg/dL (ref 1.7–2.4)

## 2023-10-12 LAB — PROTIME-INR
INR: 2.3 — ABNORMAL HIGH (ref 0.8–1.2)
Prothrombin Time: 26 s — ABNORMAL HIGH (ref 11.4–15.2)

## 2023-10-12 LAB — APTT: aPTT: 50 s — ABNORMAL HIGH (ref 24–36)

## 2023-10-12 LAB — TSH: TSH: 0.551 u[IU]/mL (ref 0.350–4.500)

## 2023-10-12 NOTE — Discharge Instructions (Addendum)
 Please be careful standing for a long period of time. Sometimes this can make you dizzy.    Your workup today did not show any kind of acute pathology.   If anything changes, please follow-up with your PCP.  Please come back to ED if anything changes such as worsening vision changes, numbness or tingling or other concerning symptoms.

## 2023-10-12 NOTE — ED Provider Notes (Signed)
 [place] [facility] AT [facility] Provider Note   CSN: [id number] Arrival date & time: 10/12/23  1836     Patient presents with: Meredith [name] is a 73 y.o. female.    Fall Pertinent negatives include no chest pain, no abdominal pain and no shortness of breath.   Patient presents because of fall.  Patient states that she was doing laundry today.  She was holding her hands up above her head for a long period time hanging laundry.  She states that she got subsequently dizzy and subsequently fell backwards and landed on her back.  She did not hit her head.  Did not lose conscious.  She has chronic back pain and is not have any kind of worsening acute back pain is point time.  No bowel bladder incontinence.  No saddle anesthesia.  Was able to walk after the fall.  Patient states that she sometimes has episodes of being dizzy.  She has had this since her last ablation.  She denies any kind of vision changes.  No diplopia.  No numbness or tingling anywhere.  She has not felt like she is having, episodes of A-fib.  Recently.  Been compliant with her warfarin.    Previous medical history reviewed : Patient was last admitted in June 2022.  Was admitted in setting of metabolic encephalopathy.  Hypotension.  A-fib.     Prior to Admission medications   Medication Sig Start Date End Date Taking? Authorizing Provider  alendronate (FOSAMAX) 35 MG tablet Take 35 mg by mouth every Thursday. 02/02/22   [provider]  ascorbic acid (VITAMIN C) 500 MG tablet Take 500 mg by mouth daily.    [provider]  aspirin  325 MG tablet Take 325 mg by mouth daily as needed for headache.     [provider]  busPIRone  (BUSPAR ) 15 MG tablet Take 1 tablet by mouth twice daily 09/12/23   Cottle, Carey G Jr., MD  Cholecalciferol (VITAMIN D3) 125 MCG (5000 UT) TABS Take 1 tablet by mouth daily.    [provider]  EPINEPHrine  0.3 mg/0.3 mL IJ SOAJ  injection Inject 0.3 mg into the muscle as needed for anaphylaxis. 07/07/19   [provider]  estradiol  (ESTRACE ) 1 MG tablet Take 1 mg by mouth daily. 01/27/19   [provider]  ferrous sulfate  325 (65 FE) MG tablet Take 325 mg by mouth 2 (two) times daily.    [provider]  furosemide  (LASIX ) 20 MG tablet Take 1 tablet (20 mg total) by mouth daily as needed. 06/04/23   Lucien Orren SAILOR, PA-C  Galcanezumab-gnlm (EMGALITY) 120 MG/ML SOAJ Inject 120 mg into the skin every 30 (thirty) days.    [provider]  HYDROcodone -acetaminophen  (NORCO) 7.5-325 MG tablet Take 1 tablet by mouth 3 (three) times daily as needed for moderate pain or severe pain.    [provider]  ipratropium (ATROVENT) 0.03 % nasal spray Place 1 spray into both nostrils 3 (three) times daily. 03/08/20   [provider]  levocetirizine (XYZAL) 5 MG tablet Take 5 mg by mouth daily. 07/07/19   [provider]  levothyroxine  (SYNTHROID ) 112 MCG tablet Take 112 mcg by mouth daily before breakfast.    [provider]  metoprolol  succinate (TOPROL -XL) 50 MG 24 hr tablet Take 1 tablet (50 mg total) by mouth daily. 09/20/23   Fenton, Clint R, PA  Multiple Vitamin (MULTIVITAMIN WITH MINERALS) TABS tablet Take 1 tablet by  mouth daily. 08/24/20   Mikhail, Maryann, DO  naloxone (NARCAN) 0.4 MG/ML injection Inject into the skin as needed. 05/05/19   [provider]  ondansetron  (ZOFRAN ) 4 MG tablet Take 1 tablet (4 mg total) by mouth every 8 (eight) hours as needed for nausea or vomiting. 11/11/19   Gretel Ozell PARAS, DPM  pantoprazole  (PROTONIX ) 40 MG tablet Take 1 tablet (40 mg total) by mouth daily. 07/07/18   [place], Betty G, MD  pravastatin  (PRAVACHOL ) 40 MG tablet Take 40 mg by mouth daily.    [provider]  pregabalin  (LYRICA ) 150 MG capsule Take 150 mg by mouth 3 (three) times daily.    [provider]  QUEtiapine  (SEROQUEL ) 300 MG tablet Take 1  tablet (300 mg total) by mouth at bedtime. 08/12/23   Cottle, Carey G Jr., MD  senna (SENOKOT) 8.6 MG TABS tablet Take 3 tablets by mouth daily as needed for mild constipation.    [provider]  verapamil  (CALAN -SR) 120 MG CR tablet Take 120 mg by mouth at bedtime.    [provider]  vitamin B-12 1000 MCG tablet Take 1 tablet (1,000 mcg total) by mouth daily. 08/24/20   Mikhail, Maryann, DO  warfarin (COUMADIN ) 2.5 MG tablet TAKE 1 TO 1 & 1/2 (ONE TO ONE & ONE-HALF) TABLETS BY MOUTH ONCE DAILY AS DIRECTED BY  COUMADIN   CLINIC. Patient taking differently: Take 2.5-3.75 mg by mouth See admin instructions. Take 2.5 mg daily except take 3.75 mg on Tuesday 07/29/23   [name], Aleene PARAS, MD    Allergies: Codeine, Adhesive [tape], Celebrex [celecoxib], Erythromycin, Sulfa antibiotics, Tricyclic antidepressants, Amoxicillin, Ampicillin, Cephalexin, Macrodantin [nitrofurantoin], and Penicillins    Review of Systems  Constitutional:  Negative for chills and fever.  HENT:  Negative for ear pain and sore throat.   Eyes:  Negative for pain and visual disturbance.  Respiratory:  Negative for cough and shortness of breath.   Cardiovascular:  Negative for chest pain and palpitations.  Gastrointestinal:  Negative for abdominal pain and vomiting.  Genitourinary:  Negative for dysuria and hematuria.  Musculoskeletal:  Negative for arthralgias and back pain.  Skin:  Negative for color change and rash.  Neurological:  Negative for seizures and syncope.  All other systems reviewed and are negative.   Updated Vital Signs BP (!) 151/74 (BP Location: Left Arm)   Pulse 95   Temp 97.8 F (36.6 C) (Oral)   Resp 18   Ht 5' (1.524 m)   Wt 87.5 kg   LMP  (LMP Unknown)   SpO2 93%   BMI 37.67 kg/m   Physical Exam Vitals and nursing note reviewed.  Constitutional:      General: She is not in acute distress.    Appearance: She is well-developed.  HENT:     Head: Normocephalic and atraumatic.   Eyes:     General: No visual field deficit.    Conjunctiva/sclera: Conjunctivae normal.  Cardiovascular:     Rate and Rhythm: Normal rate and regular rhythm.     Heart sounds: No murmur heard. Pulmonary:     Effort: Pulmonary effort is normal. No respiratory distress.     Breath sounds: Normal breath sounds.  Abdominal:     Palpations: Abdomen is soft.     Tenderness: There is no abdominal tenderness.  Musculoskeletal:        General: No swelling.     Cervical back: Neck supple.  Skin:    General: Skin is warm and dry.  Capillary Refill: Capillary refill takes less than 2 seconds.  Neurological:     Mental Status: She is alert and oriented to person, place, and time.     GCS: GCS eye subscore is 4. GCS verbal subscore is 5. GCS motor subscore is 6.     Cranial Nerves: Cranial nerves 2-12 are intact. No cranial nerve deficit.     Sensory: Sensation is intact. No sensory deficit.     Motor: Motor function is intact. No pronator drift.     Coordination: Coordination is intact. Romberg sign negative. Coordination normal. Finger-Nose-Finger Test and Heel to [facility] Test normal.     Gait: Gait is intact.  Psychiatric:        Mood and Affect: Mood normal.     (all labs ordered are listed, but only abnormal results are displayed) Labs Reviewed  URINALYSIS, ROUTINE W REFLEX MICROSCOPIC - Abnormal; Notable for the following components:      Result Value   Color, Urine COLORLESS (*)    All other components within normal limits  COMPREHENSIVE METABOLIC PANEL WITH GFR - Abnormal; Notable for the following components:   Glucose, Bld 113 (*)    All other components within normal limits  CBC WITH DIFFERENTIAL/PLATELET - Abnormal; Notable for the following components:   WBC 12.1 (*)    Neutro Abs 9.9 (*)    All other components within normal limits  PROTIME-INR - Abnormal; Notable for the following components:   Prothrombin Time 26.0 (*)    INR 2.3 (*)    All other components within  normal limits  APTT - Abnormal; Notable for the following components:   aPTT 50 (*)    All other components within normal limits  MAGNESIUM   TSH    EKG: None  Radiology: CT Head Wo Contrast Result Date: 10/12/2023 EXAM: CT HEAD AND CERVICAL SPINE 10/12/2023 07:51:44 PM TECHNIQUE: CT of the head and cervical spine was performed without the administration of intravenous contrast. Multiplanar reformatted images are provided for review. Automated exposure control, iterative reconstruction, and/or weight based adjustment of the mA/kV was utilized to reduce the radiation dose to as low as reasonably achievable. COMPARISON: 03/15/2023 CLINICAL HISTORY: Blunt trauma. Pt states was hanging laundry and got dizzy, fell backwards on to carpet and landed on buttocks. No head injury, No thinners. Reports chronic back pain, took hydrocodone  30 min pTA -per triage note. FINDINGS: CT HEAD BRAIN AND VENTRICLES: No acute intracranial hemorrhage. No mass effect or midline shift. No abnormal extra-axial fluid collection. Gray-white differentiation is maintained. No hydrocephalus. Generalized volume loss. ORBITS: No acute abnormality. SINUSES AND MASTOIDS: No acute abnormality. SOFT TISSUES AND SKULL: No acute skull fracture. No acute soft tissue abnormality. CT CERVICAL SPINE BONES AND ALIGNMENT: No acute fracture or traumatic malalignment. DEGENERATIVE CHANGES: No significant degenerative changes. SOFT TISSUES: No prevertebral soft tissue swelling. IMPRESSION: 1. No acute intracranial abnormality. 2. No acute fracture or traumatic malalignment of the cervical spine. Electronically signed by: Franky Stanford MD 10/12/2023 08:29 PM EDT RP Workstation: [id number]   CT Cervical Spine Wo Contrast Result Date: 10/12/2023 EXAM: CT HEAD AND CERVICAL SPINE 10/12/2023 07:51:44 PM TECHNIQUE: CT of the head and cervical spine was performed without the administration of intravenous contrast. Multiplanar reformatted images are provided  for review. Automated exposure control, iterative reconstruction, and/or weight based adjustment of the mA/kV was utilized to reduce the radiation dose to as low as reasonably achievable. COMPARISON: 03/15/2023 CLINICAL HISTORY: Blunt trauma. Pt states was hanging laundry and got dizzy, fell  backwards on to carpet and landed on buttocks. No head injury, No thinners. Reports chronic back pain, took hydrocodone  30 min pTA -per triage note. FINDINGS: CT HEAD BRAIN AND VENTRICLES: No acute intracranial hemorrhage. No mass effect or midline shift. No abnormal extra-axial fluid collection. Gray-white differentiation is maintained. No hydrocephalus. Generalized volume loss. ORBITS: No acute abnormality. SINUSES AND MASTOIDS: No acute abnormality. SOFT TISSUES AND SKULL: No acute skull fracture. No acute soft tissue abnormality. CT CERVICAL SPINE BONES AND ALIGNMENT: No acute fracture or traumatic malalignment. DEGENERATIVE CHANGES: No significant degenerative changes. SOFT TISSUES: No prevertebral soft tissue swelling. IMPRESSION: 1. No acute intracranial abnormality. 2. No acute fracture or traumatic malalignment of the cervical spine. Electronically signed by: Franky Stanford MD 10/12/2023 08:29 PM EDT RP Workstation: [id number]     Procedures   Medications Ordered in the ED - No data to display                                  Medical Decision Making Amount and/or Complexity of Data Reviewed Labs: ordered. Radiology: ordered.   Patient presents because of fall.  Patient states that she was doing laundry today.  She was holding her hands up above her head for a long period time hanging laundry.  She states that she got subsequently dizzy and subsequently fell backwards and landed on her back.  She did not hit her head.  Did not lose conscious.  She has chronic back pain and is not have any kind of worsening acute back pain is point time.  No bowel bladder incontinence.  No saddle anesthesia.  Was able to  walk after the fall.  Patient states that she sometimes has episodes of being dizzy.  She has had this since her last ablation.  She denies any kind of vision changes.  No diplopia.  No numbness or tingling anywhere.  She has not felt like she is having, episodes of A-fib.  Recently.  Been compliant with her warfarin.      Previous medical history reviewed : Patient was last admitted in June 2022.  Was admitted in setting of metabolic encephalopathy.  Hypotension.  A-fib.  Currently on warfarin, Toprol  50 mg daily as well as verapamil  120 mg daily for A-fib.   Upon exam, patient ANO x 3 GCS 15.  NIH is 0.  Cranials 2 through 12 intact.  Normal finger-nose.  Normal heel-to-shin.  Romberg negative.  Able to walk and it steady manner.  No concerns and can of posterior CVA this point time.     Did obtain CT head as well as CT cervical spine.  Patient is on warfarin so would rule out any count of evidence of subdural epidural.  CT scans were benign   No new pain to palpation of the mid thoracic or lumbar area.  No concerns on account of new compression fracture.   Lung sounds clear to station bilaterally.  No concerns for pneumothorax.   Laboratory workup unremarkable.   Did not think there is any current evidence of any kind of CVA that caused patient's fall.  Sounds more commendation mechanical.   Patient follow-up with PCP     Final diagnoses:  Fall, initial encounter  Chronic low back pain without sciatica, unspecified back pain laterality    ED Discharge Orders     None          Simon Lavonia SAILOR, MD 10/13/23 1644

## 2023-10-12 NOTE — ED Triage Notes (Signed)
 Pt states was hanging laundry and got dizzy, fell backwards on to carpet and landed on buttocks  No head injury, No thinners  Reports chronic back pain, took hydrocodone  30 min pTA

## 2023-10-18 ENCOUNTER — Other Ambulatory Visit (HOSPITAL_BASED_OUTPATIENT_CLINIC_OR_DEPARTMENT_OTHER): Payer: Self-pay | Admitting: Nurse Practitioner

## 2023-10-18 ENCOUNTER — Ambulatory Visit (HOSPITAL_BASED_OUTPATIENT_CLINIC_OR_DEPARTMENT_OTHER)
Admission: RE | Admit: 2023-10-18 | Discharge: 2023-10-18 | Disposition: A | Discharge status: Home / Self Care | Source: Ambulatory Visit | Attending: Nurse Practitioner | Admitting: Nurse Practitioner

## 2023-10-18 DIAGNOSIS — M533 Sacrococcygeal disorders, not elsewhere classified: Secondary | ICD-10-CM | POA: Diagnosis present

## 2023-10-18 DIAGNOSIS — M545 Low back pain, unspecified: Secondary | ICD-10-CM | POA: Insufficient documentation

## 2023-10-22 ENCOUNTER — Ambulatory Visit (INDEPENDENT_AMBULATORY_CARE_PROVIDER_SITE_OTHER): Admitting: Psychology

## 2023-10-22 DIAGNOSIS — F319 Bipolar disorder, unspecified: Secondary | ICD-10-CM

## 2023-10-22 DIAGNOSIS — F3181 Bipolar II disorder: Secondary | ICD-10-CM | POA: Diagnosis not present

## 2023-10-22 NOTE — Progress Notes (Signed)
 Meredith [name] is a 73 y.o. female patient   Date: 10/22/2023  Treatment Plan: Diagnosis F31.81 (Bipolar II disorder) [n/a]  Symptoms Depressed or irritable mood. (Status: maintained) -- No Description Entered  Lack of energy. (Status: maintained) -- No Description Entered  Social withdrawal. (Status: maintained) -- No Description Entered  Medication Status compliance  Safety none  If Suicidal or Homicidal State Action Taken: unspecified  Current Risk: low Medications Abilify  (Dosage: 30mg )  Hydocodone (Dosage: 7.5mg )  Lamotrigine  (Dosage: 200mg /day)  Lithium  (Dosage: 450mg /day)  Seroquel  (Dosage: 200mg /day)  Objectives Related Problem: Develop healthy cognitive patterns and beliefs about self and the world that lead to alleviation and help prevent the relapse of mood episodes. Description: Verbalize grief, fear, and anger regarding real or imagined losses in life. Target Date: 2024-02-09 Frequency: Daily Modality: individual Progress: 85%  Related Problem: Develop healthy cognitive patterns and beliefs about self and the world that lead to alleviation and help prevent the relapse of mood episodes. Description: Take prescribed medications as directed. Target Date: 2022-02-08 Frequency: Daily Modality: individual Progress: 100%  Related Problem: Develop healthy cognitive patterns and beliefs about self and the world that lead to alleviation and help prevent the relapse of mood episodes. Description: Describe mood state, energy level, amount of control over thoughts, and sleeping pattern. Target Date: 2024-02-09 Frequency: Daily Modality: individual Progress: 80%  Client Response full compliance   Service Location Location, [address] [place], [place] [address]  Service Code cpt [phone] P Neuropsych. testing  Related past to present  Self-monitoring  Self care activities  Emotion regulation skills  Validate/empathize  Facilitate problem solving  Normalize/Reframe  Journaling  Comments  Dx.: F31.81  Goals: States she is seeking emotional stability. She needs guidance in managing some of her physical/medical challenges and limitations. Also, have some interpersonal challenges and seeks feedback on addressing these difficulties.Revised target date is 12-25.   Meds: Seroquel  (300mg  daily), Hydrocodone  (7.5 mg up to 3x/day), Buspar  (10mg  twice daily).   Patient agrees to be seen for a video Public affairs consultant) appointment and understands the limitations of this platform. She was at home and provider was in his office.  Verna says she ended up in the emergency room a week ago. She got dizzy and fell. EMS came and she went to hospital for tests. Everything came back normal. She is still getting dizzy, put they have not determined a cause. She had this when she had Afib. But has not had Afib since ablation. Will continue to explore.                                    [name]  CONI ALM KERNS, PhD Time: 1:15p-2:00p 45 minutes                              CONI ALM KERNS, PhD

## 2023-10-31 ENCOUNTER — Ambulatory Visit: Attending: Cardiology | Admitting: *Deleted

## 2023-10-31 DIAGNOSIS — Z5181 Encounter for therapeutic drug level monitoring: Secondary | ICD-10-CM | POA: Insufficient documentation

## 2023-10-31 DIAGNOSIS — I480 Paroxysmal atrial fibrillation: Secondary | ICD-10-CM | POA: Diagnosis present

## 2023-10-31 LAB — POCT INR: INR: 2 (ref 2.0–3.0)

## 2023-10-31 NOTE — Patient Instructions (Signed)
 Description   INR-2.0; Today take 1.5 tablets of warfarin then continue taking warfarin 1 tablet daily.  Recheck INR in 5 weeks.  Call with any medication changes or if you are scheduled for surgery  Coumadin  Clinic [phone] Main [phone]

## 2023-10-31 NOTE — Progress Notes (Signed)
 Description   INR-2.0; Today take 1.5 tablets of warfarin then continue taking warfarin 1 tablet daily.  Recheck INR in 5 weeks.  Call with any medication changes or if you are scheduled for surgery  Coumadin  Clinic [phone] Main [phone]

## 2023-11-05 ENCOUNTER — Ambulatory Visit: Admitting: Psychology

## 2023-11-05 DIAGNOSIS — F319 Bipolar disorder, unspecified: Secondary | ICD-10-CM

## 2023-11-05 DIAGNOSIS — F3181 Bipolar II disorder: Secondary | ICD-10-CM

## 2023-11-05 NOTE — Progress Notes (Signed)
 Meredith [name] is a 73 y.o. female patient   Date: 11/05/2023  Treatment Plan: Diagnosis F31.81 (Bipolar II disorder) [n/a]  Symptoms Depressed or irritable mood. (Status: maintained) -- No Description Entered  Lack of energy. (Status: maintained) -- No Description Entered  Social withdrawal. (Status: maintained) -- No Description Entered  Medication Status compliance  Safety none  If Suicidal or Homicidal State Action Taken: unspecified  Current Risk: low Medications Abilify  (Dosage: 30mg )  Hydocodone (Dosage: 7.5mg )  Lamotrigine  (Dosage: 200mg /day)  Lithium  (Dosage: 450mg /day)  Seroquel  (Dosage: 200mg /day)  Objectives Related Problem: Develop healthy cognitive patterns and beliefs about self and the world that lead to alleviation and help prevent the relapse of mood episodes. Description: Verbalize grief, fear, and anger regarding real or imagined losses in life. Target Date: 2024-02-09 Frequency: Daily Modality: individual Progress: 85%  Related Problem: Develop healthy cognitive patterns and beliefs about self and the world that lead to alleviation and help prevent the relapse of mood episodes. Description: Take prescribed medications as directed. Target Date: 2022-02-08 Frequency: Daily Modality: individual Progress: 100%  Related Problem: Develop healthy cognitive patterns and beliefs about self and the world that lead to alleviation and help prevent the relapse of mood episodes. Description: Describe mood state, energy level, amount of control over thoughts, and sleeping pattern. Target Date: 2024-02-09 Frequency: Daily Modality: individual Progress: 80%  Client  Response full compliance  Service Location Location, [address] [place], [place] [address]  Service Code cpt [phone] P Neuropsych. testing  Related past to present  Self-monitoring  Self care activities  Emotion regulation skills  Validate/empathize  Facilitate problem solving  Normalize/Reframe  Journaling  Comments  Dx.: F31.81  Goals: States she is seeking emotional stability. She needs guidance in managing some of her physical/medical challenges and limitations. Also, have some interpersonal challenges and seeks feedback on addressing these difficulties.Revised target date is 12-25.   Meds: Seroquel  (300mg  daily), Hydrocodone  (7.5 mg up to 3x/day), Buspar  (10mg  twice daily).   Patient agrees to be seen for a video Public affairs consultant) appointment and understands the limitations of this platform. She was at home and provider was in his office.  Meredith [name] says she has been having some back pain and it has been problematic. Will go to the doctor (pain management) on the 18th. She saw her internist last Friday and he got her a handicap placard. This pain has contributed to her feelings of helplessness and despair. She does not have much she looks forward to and feels powerless to improve her situation. She does manage to take care of necessary tasks at home, but those are  limited. Encouraged her to find other activities, especially if involving other people. Church is a good possibility to find options.                                        Meredith CONI ALM EZZARD, PhD Time: 1:15p-2:00p 45 minutes

## 2023-11-08 ENCOUNTER — Ambulatory Visit
Admission: RE | Admit: 2023-11-08 | Discharge: 2023-11-08 | Disposition: A | Discharge status: Home / Self Care | Source: Ambulatory Visit | Attending: Family Medicine | Admitting: Family Medicine

## 2023-11-08 DIAGNOSIS — Z1231 Encounter for screening mammogram for malignant neoplasm of breast: Secondary | ICD-10-CM

## 2023-11-11 ENCOUNTER — Encounter: Payer: Self-pay | Admitting: Psychiatry

## 2023-11-11 ENCOUNTER — Ambulatory Visit (INDEPENDENT_AMBULATORY_CARE_PROVIDER_SITE_OTHER): Admitting: Psychiatry

## 2023-11-11 DIAGNOSIS — Z8669 Personal history of other diseases of the nervous system and sense organs: Secondary | ICD-10-CM

## 2023-11-11 DIAGNOSIS — F411 Generalized anxiety disorder: Secondary | ICD-10-CM | POA: Diagnosis not present

## 2023-11-11 DIAGNOSIS — F319 Bipolar disorder, unspecified: Secondary | ICD-10-CM

## 2023-11-11 DIAGNOSIS — F4310 Post-traumatic stress disorder, unspecified: Secondary | ICD-10-CM

## 2023-11-11 DIAGNOSIS — F4001 Agoraphobia with panic disorder: Secondary | ICD-10-CM

## 2023-11-11 DIAGNOSIS — G3184 Mild cognitive impairment, so stated: Secondary | ICD-10-CM

## 2023-11-11 MED ORDER — QUETIAPINE FUMARATE ER 300 MG PO TB24: 300.0000 mg | ORAL_TABLET | Freq: Every day | ORAL | 3 refills | Status: DC

## 2023-11-11 MED ORDER — BUSPIRONE HCL 15 MG PO TABS
15.0000 mg | ORAL_TABLET | Freq: Two times a day (BID) | ORAL | 0 refills | Status: DC
Start: 2023-11-11 — End: 2024-01-20

## 2023-11-11 NOTE — Progress Notes (Signed)
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 1000 MCG tablet Take 1 tablet (1,000 mcg total) by mouth daily.     warfarin (COUMADIN ) 2.5 MG tablet TAKE 1 TO 1 & 1/2 (ONE TO ONE & ONE-HALF) TABLETS BY MOUTH ONCE DAILY AS DIRECTED BY  COUMADIN   CLINIC. (Patient taking differently: Take 2.5-3.75 mg by mouth See admin instructions. Take 2.5 mg daily except take 3.75 mg on Tuesday) 120 tablet 0   No current facility-administered medications for this visit.    Medication Side Effects: as noted, denies sedation  Allergies:  Allergies  Allergen Reactions   Codeine Anaphylaxis    Patient states she can take codeine but not percocet    Adhesive [Tape]     blister   Celebrex [Celecoxib] Hives   Erythromycin Nausea And Vomiting   Sulfa Antibiotics Hives   Tricyclic Antidepressants     Doesn't help   Amoxicillin Rash   Ampicillin Rash   Cephalexin Diarrhea   Macrodantin [Nitrofurantoin] Rash   Penicillins Rash    Past Medical History:  Diagnosis Date   Anxiety    Atrial fibrillation (HCC)    Atrial fibrillation (HCC)    Bipolar 2 disorder (HCC)    Chronic kidney disease    Depression    GERD (gastroesophageal reflux disease)    Hematoma 07/06/2020   History of cardioversion    x2   History of cardioversion    Hyperlipidemia    Hypothyroidism    Ingrown toenail 10/10/2020   Migraine  headache    Osteomyelitis of foot (HCC) 11/11/2020   Osteoporosis    Pre-diabetes    Septic arthritis of interphalangeal joint of toe of right foot (HCC) 07/06/2020   Neg sleep study 4 years ago Family History  Problem Relation Age of Onset   Arthritis Mother    Depression Mother    Diabetes Mother    Heart disease Mother    Stroke Mother    Early death Father    Heart disease Father    Multiple sclerosis Sister    Atrial fibrillation Brother    Hyperlipidemia Brother    Alcohol abuse Brother    Asthma  Brother    Drug abuse Brother    Hypertension Brother    Mental illness Brother    Breast cancer Neg Hx     Social History   Socioeconomic History   Marital status: Divorced    Spouse name: Not on file   Number of children: 3   Years of education: Not on file   Highest education level: Not on file  Occupational History   Occupation: Retired  Tobacco Use   Smoking status: Never   Smokeless tobacco: Never   Tobacco comments:    Never smoked 09/20/23  Vaping Use   Vaping status: Not on file  Substance and Sexual Activity   Alcohol use: Not Currently   Drug use: No   Sexual activity: Not Currently  Other Topics Concern   Not on file  Social History Narrative   Not on file   Social Drivers of Health   Financial Resource Strain: Not on file  Food Insecurity: Not on file  Transportation Needs: Not on file  Physical Activity: Not on file  Stress: Not on file  Social Connections: Not on file  Intimate Partner Violence: Not on file    Past Medical History, Surgical history, Social history, and Family history were reviewed and updated as appropriate.   ADOPTED but has twin brother.  Please see review of systems for further details on the patient's review from today.   Objective:   Physical Exam:  LMP  (LMP Unknown)   Physical Exam Constitutional:      General: She is not in acute distress.    Appearance: She is well-developed. She is obese.   Musculoskeletal:        General: No deformity.  Neurological:     Mental Status: She is alert and oriented to person, place, and time.     Motor: No weakness.     Coordination: Coordination normal.     Gait: Gait abnormal.     Comments: cane with good pace in gait   Psychiatric:        Attention and Perception: Perception normal. She does not perceive auditory or visual hallucinations.        Mood and Affect: Mood is anxious. Mood is not depressed. Affect is not labile, blunt, angry, tearful or inappropriate.        Speech: Speech normal. Speech is not rapid and pressured or slurred.        Behavior: Behavior normal. Behavior is not agitated.        Thought Content: Thought content normal. Thought content is not delusional. Thought content does not include homicidal or suicidal ideation. Thought content does not include suicidal plan.        Cognition and Memory: Cognition is not impaired. She exhibits impaired recent memory.     Comments: Insight and judgment fair No delusions.  Cognition appears baseline but she feels it's impaired Neat and pleasant Talkative without pressure. Somatic focus.     Lab Review:     Component Value Date/Time   NA 135 10/12/2023 1925   NA 140 07/24/2023 1452   K 3.9 10/12/2023 1925   CL 99 10/12/2023 1925   CO2 23 10/12/2023 1925   GLUCOSE 113 (H) 10/12/2023 1925   BUN 11 10/12/2023 1925   BUN 16 07/24/2023 1452   CREATININE 0.96 10/12/2023 1925   CREATININE 1.02 (H) 11/11/2020 1116   CALCIUM  10.1 10/12/2023 1925   PROT 7.2 10/12/2023 1925   ALBUMIN 4.4 10/12/2023 1925   AST  23 10/12/2023 1925   ALT 14 10/12/2023 1925   ALKPHOS 55 10/12/2023 1925   BILITOT 0.6 10/12/2023 1925   GFRNONAA >60 10/12/2023 1925   GFRNONAA 61 09/02/2020 1552   GFRAA 71 09/02/2020 1552       Component Value Date/Time   WBC 12.1 (H) 10/12/2023 1925   RBC 4.26 10/12/2023 1925   HGB 13.0 10/12/2023 1925   HGB 12.3 07/24/2023 1453   HCT 38.0 10/12/2023 1925    HCT 37.2 07/24/2023 1453   PLT 193 10/12/2023 1925   PLT 208 07/24/2023 1453   MCV 89.2 10/12/2023 1925   MCV 92 07/24/2023 1453   MCH 30.5 10/12/2023 1925   MCHC 34.2 10/12/2023 1925   RDW 13.5 10/12/2023 1925   RDW 12.8 07/24/2023 1453   LYMPHSABS 1.3 10/12/2023 1925   MONOABS 0.9 10/12/2023 1925   EOSABS 0.0 10/12/2023 1925   BASOSABS 0.0 10/12/2023 1925    Lithium  Lvl  Date Value Ref Range Status  08/18/2020 0.17 (L) 0.60 - 1.20 mmol/L Final    Comment:    Performed at [facility], [address] [place], [place] [address]    May 20, 2019 lithium  level 0.9 on 450 mg daily and creatinine was 1.1 with normal calcium  07/21/19 lithium  0.4 on 450 mg daily, normal CMP  No results found for: PHENYTOIN, PHENOBARB, VALPROATE, CBMZ   .res Assessment: Plan:     Talisha was seen today for fatigue, depression and anxiety.  Diagnoses and all orders for this visit:  Bipolar affective disorder, rapid cycling (HCC)  Generalized anxiety disorder  Panic disorder with agoraphobia  PTSD (post-traumatic stress disorder)  Mild cognitive impairment  History of encephalopathy   History of lithium  toxicity Hx repeated bouts of encephalopathy  30 min face to face time with patient.  We discussed the following.  Skylarr is a chronically mentally ill patient with chronic depression and chronic anxiety and multiple med failures.  Overall is more stable psychiatrically on lower-dose quetiapine  than in the past  .  Dose was decreased to minimize SE risk.  SP hosp for acute encephalopathy 07/2020.  The cause was unknown but presumed to be medication related.   Cognition appears stable in last couple of years.  .  Using pillbox to help compliance.  Disc danger of mixing up or doubling up meds.  She fills box herself.  Rec she get help with this. Cannot afford branded meds which prevents us  from using some of the bipolar depression meds like Vraylar.  Few options for  depression and anxiety, Option Trileptal not as good for depression. therefore increased Seroquel  to 300 mg HS  will switch to XR to see if am drowsiness is better. Caution re: sedative risks emphasized and fall risk.  Afraid of going lower in Seroquel  bc fear of relapse as it is the main mood stabilizer.    Push fluids.  Has to remind herself. Emphasized again.  Says she's doing well with this.  Disc this again to try to reduce risk recurrent dehydration and fall risk.    Discussed potential metabolic side effects associated with atypical antipsychotics, as well as potential risk for movement side effects. Advised pt to contact office if movement side effects occur.  Also disc risk quetiapine  contributing to orthostasis.   OK Ativan  prn for dental procedure 1-2 mg.  No driving after it.  PCP Margarete Joylene Reasoner, Jodie Batch  Continue therapy with Dr. Gutterman q 2 weeks.  Supportive therapy dealing with  ill friends and chronic health issues  Care re: driving.  Don't drive if drowsy.  She agrees.  Switch Seroquel  IR  to ER 300 HS to see if am drowsiness is better.  It is working for mood cycling and helping depression Continue buspirone  15 BID helped anxiety.  Follow-up  2-4  mos.  She wants to have frequent follow up appts bc some chronic instability.  Lorene Macintosh MD, DFAPA  Please see After Visit Summary for patient specific instructions.  Future Appointments  Date Time Provider Department Center  11/11/2023  1:00 PM Cottle, Lorene KANDICE Raddle., MD [facility] None  11/20/2023  3:45 PM Mealor, Eulas BRAVO, MD [facility]  11/25/2023  9:30 AM Coni Alm CROME, PhD [facility] None  12/03/2023  1:00 PM Coni Alm CROME, PhD [facility] None  12/06/2023  2:00 PM [facility]  CLINIC 2 [facility]  12/17/2023  1:00 PM Coni Alm CROME, PhD [facility] None  01/28/2024  1:00 PM Coni Alm CROME, PhD [facility] None  02/25/2024  1:00 PM Coni Alm CROME, PhD [facility] None    No orders of  the defined types were placed in this encounter.      -------------------------------

## 2023-11-13 ENCOUNTER — Other Ambulatory Visit: Payer: Self-pay | Admitting: Family Medicine

## 2023-11-13 DIAGNOSIS — R928 Other abnormal and inconclusive findings on diagnostic imaging of breast: Secondary | ICD-10-CM

## 2023-11-19 ENCOUNTER — Ambulatory Visit: Admitting: Psychology

## 2023-11-20 ENCOUNTER — Ambulatory Visit: Attending: Cardiology | Admitting: Cardiovascular Disease

## 2023-11-20 ENCOUNTER — Encounter: Payer: Self-pay | Admitting: Cardiovascular Disease

## 2023-11-20 VITALS — BP 138/80 | HR 69 | Ht 60.0 in | Wt 182.0 lb

## 2023-11-20 DIAGNOSIS — I484 Atypical atrial flutter: Secondary | ICD-10-CM | POA: Diagnosis present

## 2023-11-20 DIAGNOSIS — I480 Paroxysmal atrial fibrillation: Secondary | ICD-10-CM | POA: Insufficient documentation

## 2023-11-20 NOTE — Patient Instructions (Signed)

## 2023-11-20 NOTE — Progress Notes (Signed)
 Electrophysiology Office Note:    Date:  11/20/2023   ID:  Meredith, [name] 02/23/1951, MRN [id number]  PCP:  Marvene Prentice SAUNDERS, FNP    [facility] Providers Cardiologist:  Stanly DELENA Leavens, MD Electrophysiologist:  Eulas FORBES Furbish, MD     Referring MD: Marvene Prentice SAUNDERS, FNP   History of Present Illness:    Meredith [name] is a 73 y.o. female with a medical history significant for paroxysmal atrial fibrillation, bipolar disorder on lithium  referred for management of atrial fibrillation.     She has been seen by Dr. Cindie in the past for consideration of watchman due to labile INRs.  She was not thought to be an excellent candidate at the time due to labile INRs.  History of Present Illness Meredith [name] is a 73 year old female with atrial fibrillation who presents with weakness, fatigue, and palpitations.  She has a history of atrial fibrillation since 2000 and has undergone three ablations. The first two ablations were performed using radiofrequency, and the third was a cryoablation in 2016. The first ablation lasted one year, the second lasted until 2016, and the cryoablation has lasted until now. Initially, episodes of atrial fibrillation felt like a heart attack, but currently, symptoms include significant weakness, fatigue, and palpitations.  She was seen in the emergency room on May 09, 2023, where atrial flutter was noted. She believes she experiences both atrial fibrillation and atrial flutter. During these episodes, her heart rate is described as 'pretty fast,' although she does not count it.  Her current medications include metoprolol , which was recently increased from 50 mg to 100 mg, providing only minimal relief. She is also on warfarin for anticoagulation but has not been able to afford other anticoagulants. She monitors her INR regularly, with a recent reading of 3.3, which is outside the therapeutic range for her condition.  She  underwent repeat electrophysiology study and ablation on August 25, 2023.  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  Signals across the posterior wall with very fractionated and of low amplitude.  We ablated the posterior wall of the left atrium.  She reports that since the ablation, she has not any symptoms to suggest recurrence of atrial fibrillation.         Today, she reports that she is doing reasonably well, no recent changes in her condition.  EKGs/Labs/Other Studies Reviewed Today:     Echocardiogram:  TTE June 2022 EF 60 to 65%.  Left atrium mildly dilated.     EKG:   EKG Interpretation Date/Time:  Wednesday November 20 2023 16:11:54 EDT Ventricular Rate:  69 PR Interval:  186 QRS Duration:  72 QT Interval:  422 QTC Calculation: 452 R Axis:   44  Text Interpretation: Sinus rhythm with Premature atrial complexes When compared with ECG of 12-Oct-2023 20:02, No significant change was found Confirmed by Furbish Eulas [phone]) on 11/20/2023 4:25:10 PM   I reviewed all electrocardiograms available in MUSE.  EKG April 8 shows an atypical appearing atrial flutter  Physical Exam:    VS:  BP 138/80 (BP Location: Right Arm, Patient Position: Sitting, Cuff Size: Large)   Pulse 69   Ht 5' (1.524 m)   Wt 182 lb (82.6 kg)   LMP  (LMP Unknown)   SpO2 98%   BMI 35.54 kg/m     Wt Readings from Last 3 Encounters:  11/20/23 182 lb (82.6 kg)  10/12/23 192 lb 14.4 oz (87.5 kg)  09/20/23 193  lb (87.5 kg)     GEN: Well nourished, well developed in no acute distress CARDIAC: RRR, no murmurs, rubs, gallops RESPIRATORY:  Normal work of breathing MUSCULOSKELETAL: no edema    ASSESSMENT & PLAN:     Paroxysmal atrial fibrillation, paroxysmal atrial flutter Symptomatic with palpitations, malaise Heart rates are controlled Status post 3 ablations -- two in the distant past She underwent ablation with pulsed field technology on August 23, 2023 No evidence of  A-fib recurrence since    Secondary hypercoagulable state On warfarin We are investigating getting her on patient assistance for eliquis    Dizziness She has been diagnosed with situational vertigo Symptoms have largely resolved since she has made some behavioral modifications   Signed, Eulas FORBES Furbish, MD  11/20/2023 4:35 PM    [facility]

## 2023-11-25 ENCOUNTER — Ambulatory Visit: Admitting: Psychology

## 2023-11-25 DIAGNOSIS — F3181 Bipolar II disorder: Secondary | ICD-10-CM

## 2023-11-25 DIAGNOSIS — F319 Bipolar disorder, unspecified: Secondary | ICD-10-CM

## 2023-11-25 NOTE — Progress Notes (Signed)
 Janeese Alejandra Hunt is a 73 y.o. female patient   Date: 11/25/2023  Treatment Plan: Diagnosis F31.81 (Bipolar II disorder) [n/a]  Symptoms Depressed or irritable mood. (Status: maintained) -- No Description Entered  Lack of energy. (Status: maintained) -- No Description Entered  Social withdrawal. (Status: maintained) -- No Description Entered  Medication Status compliance  Safety none  If Suicidal or Homicidal State Action Taken: unspecified  Current Risk: low Medications Abilify  (Dosage: 30mg )  Hydocodone (Dosage: 7.5mg )  Lamotrigine  (Dosage: 200mg /day)  Lithium  (Dosage: 450mg /day)  Seroquel  (Dosage: 200mg /day)  Objectives Related Problem: Develop healthy cognitive patterns and beliefs about self and the world that lead to alleviation and help prevent the relapse of mood episodes. Description: Verbalize grief, fear, and anger regarding real or imagined losses in life. Target Date: 2024-02-09 Frequency: Daily Modality: individual Progress: 85%  Related Problem: Develop healthy cognitive patterns and beliefs about self and the world that lead to alleviation and help prevent the relapse of mood episodes. Description: Take prescribed medications as directed. Target Date: 2022-02-08 Frequency: Daily Modality: individual Progress: 100%  Related Problem: Develop healthy cognitive patterns and beliefs about self and the world that lead to alleviation and help prevent the relapse of mood episodes. Description: Describe mood state, energy level, amount of control over thoughts, and sleeping pattern. Target Date: 2024-02-09 Frequency: Daily Modality:  individual Progress: 80%  Client Response full compliance  Service Location Location, [address] [place], [place] [address]  Service Code cpt [phone] P Neuropsych. testing  Related past to present  Self-monitoring  Self care activities  Emotion regulation skills  Validate/empathize  Facilitate problem solving  Normalize/Reframe  Journaling  Comments  Dx.: F31.81  Goals: States she is seeking emotional stability. She needs guidance in managing some of her physical/medical challenges and limitations. Also, have some interpersonal challenges and seeks feedback on addressing these difficulties.Revised target date is 12-25.   Meds: Seroquel  (300mg  daily), Hydrocodone  (7.5 mg up to 3x/day), Buspar  (10mg  twice daily).   Patient agrees to be seen for a video Public affairs consultant) appointment and understands the limitations of this platform. She was at home and provider was in his office.  Ryelynn says she has been sick with Norovirus and is having some stomach issues. She says her brother has been sick as well. She saw Dr. Geoffry last Monday and decided to try extended release Seroquel . Has had some difficulty with it, but will try to work through it in consultation with doctor.  Not doing much since feeling bad, but will try  to schedule some relaxing, self care activities when she starts to feel better.                                          [name]                                                  CONI ALM KERNS, PhD Time: 9:40a-10:30a 50 minutes

## 2023-11-29 ENCOUNTER — Encounter

## 2023-11-29 ENCOUNTER — Other Ambulatory Visit

## 2023-12-03 ENCOUNTER — Ambulatory Visit: Admitting: Psychology

## 2023-12-04 ENCOUNTER — Ambulatory Visit
Admission: RE | Admit: 2023-12-04 | Discharge: 2023-12-04 | Disposition: A | Source: Ambulatory Visit | Attending: Family Medicine | Admitting: Family Medicine

## 2023-12-04 ENCOUNTER — Ambulatory Visit
Admission: RE | Admit: 2023-12-04 | Discharge: 2023-12-04 | Disposition: A | Discharge status: Home / Self Care | Source: Ambulatory Visit | Attending: Family Medicine | Admitting: Family Medicine

## 2023-12-04 DIAGNOSIS — R928 Other abnormal and inconclusive findings on diagnostic imaging of breast: Secondary | ICD-10-CM

## 2023-12-06 ENCOUNTER — Ambulatory Visit: Admitting: *Deleted

## 2023-12-06 DIAGNOSIS — I480 Paroxysmal atrial fibrillation: Secondary | ICD-10-CM | POA: Insufficient documentation

## 2023-12-06 DIAGNOSIS — Z5181 Encounter for therapeutic drug level monitoring: Secondary | ICD-10-CM | POA: Diagnosis present

## 2023-12-06 LAB — POCT INR: INR: 2.9 (ref 2.0–3.0)

## 2023-12-06 NOTE — Progress Notes (Signed)
 Description   INR-2.9; Continue taking warfarin 1 tablet daily.  Recheck INR in 6 weeks.  Call with any medication changes or if you are scheduled for surgery  Coumadin  Clinic [phone] Main [phone]

## 2023-12-06 NOTE — Patient Instructions (Signed)
 Description   INR-2.9; Continue taking warfarin 1 tablet daily.  Recheck INR in 6 weeks.  Call with any medication changes or if you are scheduled for surgery  Coumadin  Clinic [phone] Main [phone]

## 2023-12-11 ENCOUNTER — Ambulatory Visit: Admitting: Psychology

## 2023-12-11 NOTE — Progress Notes (Unsigned)
 Meredith [name] is a 73 y.o. female patient   Date: 12/11/2023  Treatment Plan: Diagnosis F31.81 (Bipolar II disorder) [n/a]  Symptoms Depressed or irritable mood. (Status: maintained) -- No Description Entered  Lack of energy. (Status: maintained) -- No Description Entered  Social withdrawal. (Status: maintained) -- No Description Entered  Medication Status compliance  Safety none  If Suicidal or Homicidal State Action Taken: unspecified  Current Risk: low Medications Abilify  (Dosage: 30mg )  Hydocodone (Dosage: 7.5mg )  Lamotrigine  (Dosage: 200mg /day)  Lithium  (Dosage: 450mg /day)  Seroquel  (Dosage: 200mg /day)  Objectives Related Problem: Develop healthy cognitive patterns and beliefs about self and the world that lead to alleviation and help prevent the relapse of mood episodes. Description: Verbalize grief, fear, and anger regarding real or imagined losses in life. Target Date: 2024-02-09 Frequency: Daily Modality: individual Progress: 85%  Related Problem: Develop healthy cognitive patterns and beliefs about self and the world that lead to alleviation and help prevent the relapse of mood episodes. Description: Take prescribed medications as directed. Target Date: 2022-02-08 Frequency: Daily Modality: individual Progress: 100%  Related Problem: Develop healthy cognitive patterns and beliefs about self and the world that lead to alleviation and help prevent the relapse of mood episodes. Description: Describe mood state, energy level, amount of control over thoughts, and sleeping pattern. Target Date:  2024-02-09 Frequency: Daily Modality: individual Progress: 80%  Client Response full compliance  Service Location Location, [address] [place], [place] [address]  Service Code cpt [phone] P Neuropsych. testing  Related past to present  Self-monitoring  Self care activities  Emotion regulation skills  Validate/empathize  Facilitate problem solving  Normalize/Reframe  Journaling  Comments  Dx.: F31.81  Goals: States she is seeking emotional stability. She needs guidance in managing some of her physical/medical challenges and limitations. Also, have some interpersonal challenges and seeks feedback on addressing these difficulties.Revised target date is 12-25.   Meds: Seroquel  (300mg  daily), Hydrocodone  (7.5 mg up to 3x/day), Buspar  (10mg  twice daily).   Patient agrees to be seen for a video Public affairs consultant) appointment and understands the limitations of this platform. She was at home and provider was in his office.  Meredith [name]                                          .  Meredith ALM KERNS, PhD Time: 2:10p-3:00p 50 minutes

## 2023-12-17 ENCOUNTER — Ambulatory Visit: Admitting: Psychology

## 2023-12-17 DIAGNOSIS — F3181 Bipolar II disorder: Secondary | ICD-10-CM | POA: Diagnosis not present

## 2023-12-17 DIAGNOSIS — F319 Bipolar disorder, unspecified: Secondary | ICD-10-CM

## 2023-12-17 NOTE — Progress Notes (Signed)
 Meredith [name] is a 73 y.o. female patient   Date: 12/17/2023  Treatment Plan: Diagnosis F31.81 (Bipolar II disorder) [n/a]  Symptoms Depressed or irritable mood. (Status: maintained) -- No Description Entered  Lack of energy. (Status: maintained) -- No Description Entered  Social withdrawal. (Status: maintained) -- No Description Entered  Medication Status compliance  Safety none  If Suicidal or Homicidal State Action Taken: unspecified  Current Risk: low Medications Abilify  (Dosage: 30mg )  Hydocodone (Dosage: 7.5mg )  Lamotrigine  (Dosage: 200mg /day)  Lithium  (Dosage: 450mg /day)  Seroquel  (Dosage: 200mg /day)  Objectives Related Problem: Develop healthy cognitive patterns and beliefs about self and the world that lead to alleviation and help prevent the relapse of mood episodes. Description: Verbalize grief, fear, and anger regarding real or imagined losses in life. Target Date: 2024-02-09 Frequency: Daily Modality: individual Progress: 85%  Related Problem: Develop healthy cognitive patterns and beliefs about self and the world that lead to alleviation and help prevent the relapse of mood episodes. Description: Take prescribed medications as directed. Target Date: 2022-02-08 Frequency: Daily Modality: individual Progress: 100%  Related Problem: Develop healthy cognitive patterns and beliefs about self and the world that lead to alleviation and help prevent the relapse of mood episodes. Description: Describe mood state, energy level, amount of control over thoughts, and sleeping pattern. Target Date:  2024-02-09 Frequency: Daily Modality: individual Progress: 80%  Client Response full compliance  Service Location Location, [address] [place], [place] [address]  Service Code cpt [phone] P Neuropsych. testing  Related past to present  Self-monitoring  Self care activities  Emotion regulation skills  Validate/empathize  Facilitate problem solving  Normalize/Reframe  Journaling  Comments  Dx.: F31.81  Goals: States she is seeking emotional stability. She needs guidance in managing some of her physical/medical challenges and limitations. Also, have some interpersonal challenges and seeks feedback on addressing these difficulties.Revised target date is 12-25.   Meds: Seroquel  (300mg  daily), Hydrocodone  (7.5 mg up to 3x/day), Buspar  (10mg  twice daily).   Patient agrees to be seen for a video Public affairs consultant) appointment and understands the limitations of this platform. She was at home and provider was in his office.  [name] says she was very sick 2 weeks ago. Lost 15 pounds because she couldn't eat during a 10 day period. She has had a lot of challenges these past 2 weeks and is pleased that she managed well. We talked about my upcoming vacation and how she  will manage in my absence. She reports that she feels reasonably confident that her moods will remain stable. Taled about how best to deal with any anxiety or depressive episodes while I am out of town.                                               Meredith [name]                                                  Meredith ALM KERNS, PhD Time: 1:10p-2:00p 50 minutes

## 2024-01-17 ENCOUNTER — Ambulatory Visit: Attending: Internal Medicine | Admitting: *Deleted

## 2024-01-17 DIAGNOSIS — I480 Paroxysmal atrial fibrillation: Secondary | ICD-10-CM | POA: Insufficient documentation

## 2024-01-17 DIAGNOSIS — Z5181 Encounter for therapeutic drug level monitoring: Secondary | ICD-10-CM | POA: Diagnosis present

## 2024-01-17 LAB — POCT INR: INR: 2.5 (ref 2.0–3.0)

## 2024-01-17 NOTE — Progress Notes (Signed)
 Description   INR-2.5; Continue taking warfarin 1 tablet daily.  Recheck INR in 7 weeks.  Call with any medication changes or if you are scheduled for surgery  Coumadin  Clinic [phone] Main [phone]

## 2024-01-17 NOTE — Patient Instructions (Addendum)
 Description   INR-2.5; Continue taking warfarin 1 tablet daily.  Recheck INR in 7 weeks.  Call with any medication changes or if you are scheduled for surgery  Coumadin  Clinic [phone] Main [phone]

## 2024-01-20 ENCOUNTER — Ambulatory Visit: Admitting: Psychiatry

## 2024-01-20 ENCOUNTER — Encounter: Payer: Self-pay | Admitting: Psychiatry

## 2024-01-20 DIAGNOSIS — F319 Bipolar disorder, unspecified: Secondary | ICD-10-CM | POA: Diagnosis not present

## 2024-01-20 DIAGNOSIS — F4001 Agoraphobia with panic disorder: Secondary | ICD-10-CM | POA: Diagnosis not present

## 2024-01-20 DIAGNOSIS — F411 Generalized anxiety disorder: Secondary | ICD-10-CM

## 2024-01-20 DIAGNOSIS — F4310 Post-traumatic stress disorder, unspecified: Secondary | ICD-10-CM | POA: Diagnosis not present

## 2024-01-20 DIAGNOSIS — G3184 Mild cognitive impairment, so stated: Secondary | ICD-10-CM

## 2024-01-20 DIAGNOSIS — Z8669 Personal history of other diseases of the nervous system and sense organs: Secondary | ICD-10-CM

## 2024-01-20 MED ORDER — BUSPIRONE HCL 15 MG PO TABS
15.0000 mg | ORAL_TABLET | Freq: Two times a day (BID) | ORAL | 1 refills | Status: AC
Start: 2024-01-20 — End: ?

## 2024-01-20 MED ORDER — QUETIAPINE FUMARATE ER 300 MG PO TB24: 300.0000 mg | ORAL_TABLET | Freq: Every day | ORAL | 3 refills | Status: AC

## 2024-01-20 NOTE — Progress Notes (Signed)
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 mouth daily. 90 tablet 0   pravastatin  (PRAVACHOL ) 40 MG tablet Take 40 mg by mouth daily.     pregabalin  (LYRICA ) 150 MG capsule Take 150 mg by mouth 3 (three) times daily.     senna (SENOKOT) 8.6 MG TABS tablet Take 3 tablets by mouth daily as needed for mild constipation.     verapamil  (CALAN -SR) 120 MG CR tablet Take 120 mg by mouth at bedtime.     vitamin B-12 1000 MCG tablet Take 1 tablet (1,000 mcg total) by mouth daily.     warfarin (COUMADIN ) 2.5 MG tablet TAKE 1 TO 1 & 1/2 (ONE TO ONE & ONE-HALF) TABLETS BY MOUTH ONCE DAILY AS DIRECTED BY  COUMADIN   CLINIC. (Patient taking differently: Take 2.5-3.75 mg by mouth See admin instructions. Take 2.5 mg daily except take 3.75 mg on Tuesday) 120 tablet 0   busPIRone  (BUSPAR ) 15 MG tablet Take 1 tablet (15 mg total) by mouth 2 (two) times daily. 180 tablet 1   QUEtiapine  (SEROQUEL  XR) 300 MG 24 hr tablet Take 1 tablet (300 mg total) by mouth at bedtime. 30 tablet 3   No current facility-administered medications for this visit.    Medication Side Effects: as noted, denies sedation  Allergies:  Allergies  Allergen Reactions   Codeine Anaphylaxis    Patient states she can take codeine but not percocet    Adhesive [Tape]     blister   Celebrex [Celecoxib] Hives    Erythromycin Nausea And Vomiting   Sulfa Antibiotics Hives   Tricyclic Antidepressants     Doesn't help   Amoxicillin Rash   Ampicillin Rash   Cephalexin Diarrhea   Macrodantin [Nitrofurantoin] Rash   Penicillins Rash    Past Medical History:  Diagnosis Date   Anxiety    Atrial fibrillation (HCC)    Atrial fibrillation (HCC)    Bipolar 2 disorder (HCC)    Chronic kidney disease    Depression    GERD (gastroesophageal reflux disease)    Hematoma 07/06/2020   History of cardioversion    x2   History of cardioversion  Hyperlipidemia    Hypothyroidism    Ingrown toenail 10/10/2020   Migraine headache    Osteomyelitis of foot (HCC) 11/11/2020   Osteoporosis    Pre-diabetes    Septic arthritis of interphalangeal joint of toe of right foot (HCC) 07/06/2020   Neg sleep study 4 years ago Family History  Problem Relation Age of Onset   Arthritis Mother    Depression Mother    Diabetes Mother    Heart disease Mother    Stroke Mother    Early death Father    Heart disease Father    Multiple sclerosis Sister    Atrial fibrillation Brother    Hyperlipidemia Brother    Alcohol abuse Brother    Asthma Brother    Drug abuse Brother    Hypertension Brother    Mental illness Brother    Breast cancer Neg Hx     Social History   Socioeconomic History   Marital status: Divorced    Spouse name: Not on file   Number of children: 3   Years of education: Not on file   Highest education level: Not on file  Occupational History   Occupation: Retired  Tobacco Use   Smoking status: Never   Smokeless tobacco: Never   Tobacco comments:    Never smoked 09/20/23  Vaping Use   Vaping status: Not on file  Substance and Sexual Activity   Alcohol use: Not Currently   Drug use: No   Sexual activity: Not Currently  Other Topics Concern   Not on file  Social History Narrative   Not on file   Social Drivers of Health   Financial Resource Strain: Not on file  Food  Insecurity: Not on file  Transportation Needs: Not on file  Physical Activity: Not on file  Stress: Not on file  Social Connections: Not on file  Intimate Partner Violence: Not on file    Past Medical History, Surgical history, Social history, and Family history were reviewed and updated as appropriate.   ADOPTED but has twin brother.  Please see review of systems for further details on the patient's review from today.   Objective:   Physical Exam:  LMP  (LMP Unknown)   Physical Exam Constitutional:      General: She is not in acute distress.    Appearance: She is well-developed. She is obese.  Musculoskeletal:        General: No deformity.  Neurological:     Mental Status: She is alert and oriented to person, place, and time.     Motor: No weakness.     Coordination: Coordination normal.     Gait: Gait abnormal.     Comments: cane with good pace in gait   Psychiatric:        Attention and Perception: Perception normal. She does not perceive auditory or visual hallucinations.        Mood and Affect: Mood is anxious. Mood is not depressed. Affect is not labile, blunt, angry, tearful or inappropriate.        Speech: Speech normal. Speech is not rapid and pressured or slurred.        Behavior: Behavior normal. Behavior is not agitated.        Thought Content: Thought content normal. Thought content is not delusional. Thought content does not include homicidal or suicidal ideation. Thought content does not include suicidal plan.        Cognition and Memory: Cognition is not impaired. She exhibits impaired recent  memory.     Comments: Insight and judgment fair No delusions.  Cognition appears baseline but she feels it's impaired Neat and pleasant Talkative without pressure. Somatic focus.     Lab Review:     Component Value Date/Time   NA 135 10/12/2023 1925   NA 140 07/24/2023 1452   K 3.9 10/12/2023 1925   CL 99 10/12/2023 1925   CO2 23 10/12/2023 1925   GLUCOSE  113 (H) 10/12/2023 1925   BUN 11 10/12/2023 1925   BUN 16 07/24/2023 1452   CREATININE 0.96 10/12/2023 1925   CREATININE 1.02 (H) 11/11/2020 1116   CALCIUM  10.1 10/12/2023 1925   PROT 7.2 10/12/2023 1925   ALBUMIN 4.4 10/12/2023 1925   AST 23 10/12/2023 1925   ALT 14 10/12/2023 1925   ALKPHOS 55 10/12/2023 1925   BILITOT 0.6 10/12/2023 1925   GFRNONAA >60 10/12/2023 1925   GFRNONAA 61 09/02/2020 1552   GFRAA 71 09/02/2020 1552       Component Value Date/Time   WBC 12.1 (H) 10/12/2023 1925   RBC 4.26 10/12/2023 1925   HGB 13.0 10/12/2023 1925   HGB 12.3 07/24/2023 1453   HCT 38.0 10/12/2023 1925   HCT 37.2 07/24/2023 1453   PLT 193 10/12/2023 1925   PLT 208 07/24/2023 1453   MCV 89.2 10/12/2023 1925   MCV 92 07/24/2023 1453   MCH 30.5 10/12/2023 1925   MCHC 34.2 10/12/2023 1925   RDW 13.5 10/12/2023 1925   RDW 12.8 07/24/2023 1453   LYMPHSABS 1.3 10/12/2023 1925   MONOABS 0.9 10/12/2023 1925   EOSABS 0.0 10/12/2023 1925   BASOSABS 0.0 10/12/2023 1925    Lithium  Lvl  Date Value Ref Range Status  08/18/2020 0.17 (L) 0.60 - 1.20 mmol/L Final    Comment:    Performed at [facility], [address] [place], [place] [address]    May 20, 2019 lithium  level 0.9 on 450 mg daily and creatinine was 1.1 with normal calcium  07/21/19 lithium  0.4 on 450 mg daily, normal CMP  No results found for: PHENYTOIN, PHENOBARB, VALPROATE, CBMZ   .res Assessment: Plan:     Merina was seen today for follow-up, depression and anxiety.  Diagnoses and all orders for this visit:  Bipolar affective disorder, rapid cycling (HCC) -     QUEtiapine  (SEROQUEL  XR) 300 MG 24 hr tablet; Take 1 tablet (300 mg total) by mouth at bedtime.  Generalized anxiety disorder -     busPIRone  (BUSPAR ) 15 MG tablet; Take 1 tablet (15 mg total) by mouth 2 (two) times daily.  Panic disorder with agoraphobia -     busPIRone  (BUSPAR ) 15 MG tablet; Take 1 tablet (15 mg total) by mouth 2 (two)  times daily.  PTSD (post-traumatic stress disorder) -     busPIRone  (BUSPAR ) 15 MG tablet; Take 1 tablet (15 mg total) by mouth 2 (two) times daily.  Mild cognitive impairment  History of encephalopathy   History of lithium  toxicity Hx repeated bouts of encephalopathy  30 min face to face time with patient.  We discussed the following.  Esma is a chronically mentally ill patient with chronic depression and chronic anxiety and multiple med failures.  Overall is more stable psychiatrically on lower-dose quetiapine  than in the past  .    [facility] for acute encephalopathy 07/2020.  The cause was unknown but presumed to be medication related.   Cognition appears stable in last couple of years.  .  Using pillbox to  help compliance.  Disc danger of mixing up or doubling up meds.  She fills box herself.  Rec she get help with this. Cannot afford branded meds which prevents us  from using some of the bipolar depression meds like Vraylar.  Few options for depression and anxiety, Option Trileptal not as good for depression. Seroquel  to 300 mg XR HS Caution re: sedative risks emphasized and fall risk.  Afraid of going lower in Seroquel  bc fear of relapse as it is the main mood stabilizer.    Push fluids.  Has to remind herself. Emphasized again.  Says she's doing well with this.  Disc this again to try to reduce risk recurrent dehydration and fall risk.    Discussed potential metabolic side effects associated with atypical antipsychotics, as well as potential risk for movement side effects. Advised pt to contact office if movement side effects occur.  Also disc risk quetiapine  contributing to orthostasis.   OK Ativan  prn for dental procedure 1-2 mg.  No driving after it.  PCP Margarete Joylene Reasoner, Jodie Batch  Continue therapy with Dr. Gutterman q 2 weeks.  Supportive therapy dealing with ill friends and chronic health issues   also she does card ministry at church.   Care re: driving.  Don't  drive if drowsy.  She agrees.  Seroquel  ER 300 HS  Continue buspirone  15 BID helped anxiety.  Follow-up  2-4  mos.  She wants to have frequent follow up appts bc some chronic instability.  Lorene Macintosh MD, DFAPA  Please see After Visit Summary for patient specific instructions.  Future Appointments  Date Time Provider Department Center  01/28/2024  1:00 PM Gutterman, David L, PhD [facility] None  02/11/2024  1:00 PM Gutterman, David L, PhD [facility] None  02/25/2024  1:00 PM Coni Alm CROME, PhD [facility] None  03/06/2024  2:00 PM [facility]  CLINIC 2 [facility]  03/10/2024  1:00 PM Coni Alm CROME, PhD [facility] None  03/24/2024  1:00 PM Gutterman, David L, PhD [facility] None  04/07/2024  1:00 PM Gutterman, David L, PhD [facility] None  04/21/2024  1:00 PM Gutterman, David L, PhD [facility] None  05/05/2024  1:00 PM Coni Alm CROME, PhD [facility] None  05/19/2024  1:00 PM Coni Alm CROME, PhD [facility] None  05/20/2024  1:30 PM Terra Fairy PARAS, PA-C [facility] H&V  06/02/2024  1:00 PM Coni Alm CROME, PhD [facility] None  06/16/2024  1:00 PM Coni Alm CROME, PhD [facility] None  06/30/2024  1:00 PM Coni Alm CROME, PhD [facility] None    No orders of the defined types were placed in this encounter.      -------------------------------

## 2024-01-28 ENCOUNTER — Ambulatory Visit: Admitting: Psychology

## 2024-01-28 DIAGNOSIS — F319 Bipolar disorder, unspecified: Secondary | ICD-10-CM

## 2024-01-28 NOTE — Progress Notes (Signed)
 Meredith [name] is a 73 y.o. female patient   Date: 01/28/2024  Treatment Plan: Diagnosis F31.81 (Bipolar II disorder) [n/a]  Symptoms Depressed or irritable mood. (Status: maintained) -- No Description Entered  Lack of energy. (Status: maintained) -- No Description Entered  Social withdrawal. (Status: maintained) -- No Description Entered  Medication Status compliance  Safety none  If Suicidal or Homicidal State Action Taken: unspecified  Current Risk: low Medications Abilify  (Dosage: 30mg )  Hydocodone (Dosage: 7.5mg )  Lamotrigine  (Dosage: 200mg /day)  Lithium  (Dosage: 450mg /day)  Seroquel  (Dosage: 200mg /day)  Objectives Related Problem: Develop healthy cognitive patterns and beliefs about self and the world that lead to alleviation and help prevent the relapse of mood episodes. Description: Verbalize grief, fear, and anger regarding real or imagined losses in life. Target Date: 2024-02-09 Frequency: Daily Modality: individual Progress: 85%  Related Problem: Develop healthy cognitive patterns and beliefs about self and the world that lead to alleviation and help prevent the relapse of mood episodes. Description: Take prescribed medications as directed. Target Date: 2022-02-08 Frequency: Daily Modality: individual Progress: 100%  Related Problem: Develop healthy cognitive patterns and beliefs about self and the world that lead to alleviation and help prevent the relapse of mood episodes. Description: Describe mood state, energy level, amount of control over thoughts, and sleeping  pattern. Target Date: 2024-02-09 Frequency: Daily Modality: individual Progress: 80%  Client Response full compliance  Service Location Location, [address] [place], [place] [address]  Service Code cpt [id number] P Neuropsych. testing  Related past to present  Self-monitoring  Self care activities  Emotion regulation skills  Validate/empathize  Facilitate problem solving  Normalize/Reframe  Journaling  Comments  Dx.: F31.81  Goals: States she is seeking emotional stability. She needs guidance in managing some of her physical/medical challenges and limitations. Also, have some interpersonal challenges and seeks feedback on addressing these difficulties.Revised target date is 12-25.   Meds: Seroquel  (300mg  daily), Hydrocodone  (7.5 mg up to 3x/day), Buspar  (10mg  twice daily).   Patient agrees to be seen for a video Public Affairs Consultant) appointment and understands the limitations of this platform. She was at home and provider was in his office.  Meredith [name] says she is doing well and managed fine during my absence. She and brother went out together for Thanksgiving and had a nice time. She said that she had contact with son  on his birthday (48th). She says she saw Dr. Geoffry on Nov. 24 and he did not change any medication. She discussed her Christmas plans and is feeling positive about family gathering. Physical condition currently stable, other than back pain. Will be followed by pain management clinic.                                                  [name]                                                  CONI ALM KERNS, PhD Time: 1:15p-2:00p 45 minutes

## 2024-02-11 ENCOUNTER — Ambulatory Visit: Admitting: Psychology

## 2024-02-11 DIAGNOSIS — F3181 Bipolar II disorder: Secondary | ICD-10-CM | POA: Diagnosis not present

## 2024-02-11 DIAGNOSIS — F319 Bipolar disorder, unspecified: Secondary | ICD-10-CM
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
## 2024-02-24 ENCOUNTER — Other Ambulatory Visit (HOSPITAL_COMMUNITY): Payer: Self-pay | Admitting: *Deleted

## 2024-02-24 MED ORDER — METOPROLOL SUCCINATE ER 50 MG PO TB24: 50.0000 mg | ORAL_TABLET | Freq: Every day | ORAL | 3 refills | Status: AC

## 2024-02-25 ENCOUNTER — Ambulatory Visit: Admitting: Psychology

## 2024-02-25 DIAGNOSIS — F319 Bipolar disorder, unspecified: Secondary | ICD-10-CM
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
## 2024-03-06 ENCOUNTER — Ambulatory Visit: Attending: Cardiology | Admitting: Pharmacist

## 2024-03-06 DIAGNOSIS — I480 Paroxysmal atrial fibrillation: Secondary | ICD-10-CM | POA: Insufficient documentation

## 2024-03-06 DIAGNOSIS — Z5181 Encounter for therapeutic drug level monitoring: Secondary | ICD-10-CM | POA: Diagnosis present

## 2024-03-06 LAB — POCT INR: INR: 1.7 — AB (ref 2.0–3.0)

## 2024-03-06 NOTE — Progress Notes (Signed)
 Description   INR-1.7; Take 2 tablets today and then continue taking warfarin 1 tablet daily.  Recheck INR in 4 weeks.  Call with any medication changes or if you are scheduled for surgery  Coumadin  Clinic [id number] Main [phone]

## 2024-03-06 NOTE — Patient Instructions (Signed)
 Description   INR-1.7; Take 2 tablets today and then continue taking warfarin 1 tablet daily.  Recheck INR in 4 weeks.  Call with any medication changes or if you are scheduled for surgery  Coumadin  Clinic [id number] Main [phone]

## 2024-03-10 ENCOUNTER — Ambulatory Visit: Admitting: Psychology

## 2024-03-10 DIAGNOSIS — F319 Bipolar disorder, unspecified: Secondary | ICD-10-CM

## 2024-03-10 NOTE — Progress Notes (Signed)
 "                                                                                                                                                                                                                                                   Georgianne Gritz is a 74 y.o. female patient   Date: 03/10/2024  Treatment Plan: Diagnosis F31.81 (Bipolar II disorder) [n/a]  Symptoms Depressed or irritable mood. (Status: maintained) -- No Description Entered  Lack of energy. (Status: maintained) -- No Description Entered  Social withdrawal. (Status: maintained) -- No Description Entered  Medication Status compliance  Safety none  If Suicidal or Homicidal State Action Taken: unspecified  Current Risk: low Medications Abilify  (Dosage: 30mg )  Hydocodone (Dosage: 7.5mg )  Lamotrigine  (Dosage: 200mg /day)  Lithium  (Dosage: 450mg /day)  Seroquel  (Dosage: 200mg /day)  Objectives Related Problem: Develop healthy cognitive patterns and beliefs about self and the world that lead to alleviation and help prevent the relapse of mood episodes. Description: Verbalize grief, fear, and anger regarding real or imagined losses in life. Target Date: 2025-02-08 Frequency: Daily Modality: individual Progress: 90%  Related Problem: Develop healthy cognitive patterns and beliefs about self and the world that lead to alleviation and help prevent the relapse of mood episodes. Description: Take prescribed medications as directed. Target Date: 2022-02-08 Frequency: Daily Modality: individual Progress: 100%  Related Problem: Develop healthy cognitive patterns and beliefs about self and the world that lead to alleviation and help prevent the relapse of mood  episodes. Description: Describe mood state, energy level, amount of control over thoughts, and sleeping pattern. Target Date: 2025-02-08 Frequency: Daily Modality: individual Progress: 90%  Client Response full compliance  Service Location Location, [address] [place], [place] [address]  Service Code cpt [id number] P Neuropsych. testing  Related past to present  Self-monitoring  Self care activities  Emotion regulation skills  Validate/empathize  Facilitate problem solving  Normalize/Reframe  Journaling  Comments  Dx.: F31.81  Goals: States she is seeking emotional stability. She needs guidance in managing some of her physical/medical challenges and limitations. Also, have some interpersonal challenges and seeks feedback on addressing these difficulties.Revised target date is 12-26.   Meds: Seroquel  (300mg  daily), Hydrocodone  (7.5 mg up to 3x/day), Buspar  (10mg  twice daily).   Patient agrees to be seen for a video Public Affairs Consultant) appointment and understands the limitations of  this platform. She was at home and provider was in his office.  Khaleesi says she is doing well. Has been trying to exercise more lately. She says her back and hips hurt and she needs another injection. She has been seeing her brother a lot lately. Moods have been mostly stable. She does say that she has some ups and downs. Tends to get headaches when stressed. Will use medication and relaxation to manage headaches.                                                        [name]                                                  CONI ALM KERNS, PhD Time: 1:15p-2:00p 45 minutes                              "

## 2024-03-24 ENCOUNTER — Ambulatory Visit: Admitting: Psychology

## 2024-03-24 DIAGNOSIS — F3181 Bipolar II disorder: Secondary | ICD-10-CM | POA: Diagnosis not present

## 2024-03-24 DIAGNOSIS — F319 Bipolar disorder, unspecified: Secondary | ICD-10-CM
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
## 2024-04-01 ENCOUNTER — Ambulatory Visit: Admitting: *Deleted

## 2024-04-01 DIAGNOSIS — Z5181 Encounter for therapeutic drug level monitoring: Secondary | ICD-10-CM

## 2024-04-01 DIAGNOSIS — I480 Paroxysmal atrial fibrillation: Secondary | ICD-10-CM

## 2024-04-01 LAB — POCT INR: INR: 2 (ref 2.0–3.0)

## 2024-04-01 NOTE — Progress Notes (Signed)
 Description   INR-2.0; Take 2 tablets today and then continue taking warfarin 1 tablet daily.  Recheck INR in 5 weeks.  Call with any medication changes or if you are scheduled for surgery  Coumadin  Clinic [phone] Main [phone]

## 2024-04-01 NOTE — Patient Instructions (Addendum)
 Description   INR-2.0; Take 2 tablets today and then continue taking warfarin 1 tablet daily.  Recheck INR in 5 weeks.  Call with any medication changes or if you are scheduled for surgery  Coumadin  Clinic [phone] Main [phone]

## 2024-04-07 ENCOUNTER — Ambulatory Visit: Admitting: Psychology

## 2024-04-21 ENCOUNTER — Ambulatory Visit: Admitting: Psychology

## 2024-04-22 ENCOUNTER — Ambulatory Visit: Admitting: Psychiatry

## 2024-05-05 ENCOUNTER — Ambulatory Visit: Admitting: Psychology

## 2024-05-06 ENCOUNTER — Ambulatory Visit

## 2024-05-19 ENCOUNTER — Ambulatory Visit: Admitting: Psychology

## 2024-05-20 ENCOUNTER — Ambulatory Visit (HOSPITAL_COMMUNITY): Admitting: Internal Medicine

## 2024-06-02 ENCOUNTER — Ambulatory Visit: Admitting: Psychology

## 2024-06-16 ENCOUNTER — Ambulatory Visit: Admitting: Psychology

## 2024-06-30 ENCOUNTER — Ambulatory Visit: Admitting: Psychology
